# Patient Record
Sex: Female | Born: 1970 | Race: Black or African American | Hispanic: No | Marital: Married | Smoking: Never smoker
Health system: Southern US, Community
[De-identification: ages and names within clinical notes are randomized; demographics above are authoritative.]

## PROBLEM LIST (undated history)

## (undated) DIAGNOSIS — R0602 Shortness of breath: Secondary | ICD-10-CM

## (undated) DIAGNOSIS — G629 Polyneuropathy, unspecified: Secondary | ICD-10-CM

## (undated) DIAGNOSIS — R600 Localized edema: Secondary | ICD-10-CM

## (undated) DIAGNOSIS — K5900 Constipation, unspecified: Secondary | ICD-10-CM

## (undated) DIAGNOSIS — M1990 Unspecified osteoarthritis, unspecified site: Secondary | ICD-10-CM

## (undated) DIAGNOSIS — M719 Bursopathy, unspecified: Secondary | ICD-10-CM

## (undated) DIAGNOSIS — C801 Malignant (primary) neoplasm, unspecified: Secondary | ICD-10-CM

## (undated) DIAGNOSIS — F32A Depression, unspecified: Secondary | ICD-10-CM

## (undated) DIAGNOSIS — M2550 Pain in unspecified joint: Secondary | ICD-10-CM

## (undated) DIAGNOSIS — D649 Anemia, unspecified: Secondary | ICD-10-CM

## (undated) DIAGNOSIS — I10 Essential (primary) hypertension: Secondary | ICD-10-CM

## (undated) DIAGNOSIS — G43909 Migraine, unspecified, not intractable, without status migrainosus: Secondary | ICD-10-CM

## (undated) DIAGNOSIS — R519 Headache, unspecified: Secondary | ICD-10-CM

## (undated) DIAGNOSIS — Z923 Personal history of irradiation: Secondary | ICD-10-CM

## (undated) DIAGNOSIS — R51 Headache: Secondary | ICD-10-CM

## (undated) DIAGNOSIS — G5600 Carpal tunnel syndrome, unspecified upper limb: Secondary | ICD-10-CM

## (undated) DIAGNOSIS — C50919 Malignant neoplasm of unspecified site of unspecified female breast: Secondary | ICD-10-CM

## (undated) DIAGNOSIS — R0600 Dyspnea, unspecified: Secondary | ICD-10-CM

## (undated) DIAGNOSIS — Z9189 Other specified personal risk factors, not elsewhere classified: Secondary | ICD-10-CM

## (undated) DIAGNOSIS — K219 Gastro-esophageal reflux disease without esophagitis: Secondary | ICD-10-CM

## (undated) DIAGNOSIS — M549 Dorsalgia, unspecified: Secondary | ICD-10-CM

## (undated) DIAGNOSIS — G4733 Obstructive sleep apnea (adult) (pediatric): Secondary | ICD-10-CM

## (undated) DIAGNOSIS — R7303 Prediabetes: Secondary | ICD-10-CM

## (undated) HISTORY — DX: Constipation, unspecified: K59.00

## (undated) HISTORY — PX: BREAST SURGERY: SHX581

## (undated) HISTORY — DX: Obstructive sleep apnea (adult) (pediatric): G47.33

## (undated) HISTORY — DX: Prediabetes: R73.03

## (undated) HISTORY — DX: Essential (primary) hypertension: I10

## (undated) HISTORY — DX: Shortness of breath: R06.02

## (undated) HISTORY — DX: Pain in unspecified joint: M25.50

## (undated) HISTORY — DX: Bursopathy, unspecified: M71.9

## (undated) HISTORY — DX: Carpal tunnel syndrome, unspecified upper limb: G56.00

## (undated) HISTORY — DX: Migraine, unspecified, not intractable, without status migrainosus: G43.909

## (undated) HISTORY — DX: Dorsalgia, unspecified: M54.9

## (undated) HISTORY — DX: Depression, unspecified: F32.A

## (undated) HISTORY — DX: Other specified personal risk factors, not elsewhere classified: Z91.89

## (undated) SURGERY — BREAST LUMPECTOMY WITH NEEDLE LOCALIZATION
Anesthesia: General | Laterality: Right

---

## 2002-04-01 HISTORY — PX: TUBAL LIGATION: SHX77

## 2015-03-09 DIAGNOSIS — G44009 Cluster headache syndrome, unspecified, not intractable: Secondary | ICD-10-CM | POA: Insufficient documentation

## 2015-03-09 DIAGNOSIS — D649 Anemia, unspecified: Secondary | ICD-10-CM | POA: Insufficient documentation

## 2015-03-09 DIAGNOSIS — I10 Essential (primary) hypertension: Secondary | ICD-10-CM | POA: Insufficient documentation

## 2015-03-09 DIAGNOSIS — Z0000 Encounter for general adult medical examination without abnormal findings: Secondary | ICD-10-CM | POA: Insufficient documentation

## 2015-03-09 DIAGNOSIS — Z9289 Personal history of other medical treatment: Secondary | ICD-10-CM | POA: Insufficient documentation

## 2015-03-22 ENCOUNTER — Ambulatory Visit: Payer: Self-pay | Attending: Oncology

## 2015-03-22 VITALS — BP 137/94 | HR 73 | Temp 98.5°F | Resp 18 | Ht 71.0 in | Wt 315.3 lb

## 2015-03-22 DIAGNOSIS — Z0000 Encounter for general adult medical examination without abnormal findings: Secondary | ICD-10-CM

## 2015-03-22 NOTE — Progress Notes (Signed)
Subjective:     Patient ID: Michelle Mcmillan, female   DOB: 1970/07/08, 44 y.o.   MRN: [id number]  HPI   Review of Systems     Objective:   Physical Exam  Pulmonary/Chest: Right breast exhibits no inverted nipple, no mass, no nipple discharge, no skin change and no tenderness. Left breast exhibits no inverted nipple, no mass, no nipple discharge, no skin change and no tenderness. Breasts are symmetrical.       Assessment:     44 year old patient presents for [facility] visitPatient screened, and meets B[facility]CP eligibility.  Patient does not have insurance, Medicare or Medicaid.  Handout given on Affordable Care Act.Instructed patient on breast self-exam using teach back method.  CBE unremarkable.  No mass or lump palpated. Patient reports her last mammograms were in [place], and that she was to have 6 month follow-up in June 2016 , but she was moving, and did not go for mammogram.   She is getting married 04/08/15.  Feeling a little stressed and does not want to wait long for mammogram.  Explained need for film comparison.     Plan:     Sent patient to breast center to sign release for film/mammogram report.  Will schedule mammogram when outside films received.

## 2015-04-02 DIAGNOSIS — C50919 Malignant neoplasm of unspecified site of unspecified female breast: Secondary | ICD-10-CM

## 2015-04-02 HISTORY — DX: Malignant neoplasm of unspecified site of unspecified female breast: C50.919

## 2015-04-20 ENCOUNTER — Other Ambulatory Visit: Payer: Self-pay

## 2015-04-20 DIAGNOSIS — N63 Unspecified lump in unspecified breast: Secondary | ICD-10-CM

## 2015-04-21 ENCOUNTER — Inpatient Hospital Stay
Admission: RE | Admit: 2015-04-21 | Discharge: 2015-04-21 | Disposition: A | Discharge status: Home / Self Care | Payer: Self-pay | Source: Ambulatory Visit | Attending: *Deleted | Admitting: *Deleted

## 2015-04-21 ENCOUNTER — Other Ambulatory Visit: Payer: Self-pay | Admitting: *Deleted

## 2015-04-21 DIAGNOSIS — Z9289 Personal history of other medical treatment: Secondary | ICD-10-CM

## 2015-05-03 ENCOUNTER — Ambulatory Visit
Admission: RE | Admit: 2015-05-03 | Discharge: 2015-05-03 | Disposition: A | Discharge status: Home / Self Care | Payer: Medicaid Other | Source: Ambulatory Visit | Attending: Oncology | Admitting: Oncology

## 2015-05-03 ENCOUNTER — Other Ambulatory Visit: Payer: Self-pay | Admitting: *Deleted

## 2015-05-03 DIAGNOSIS — N63 Unspecified lump in unspecified breast: Secondary | ICD-10-CM

## 2015-05-03 DIAGNOSIS — D242 Benign neoplasm of left breast: Secondary | ICD-10-CM | POA: Insufficient documentation

## 2015-05-04 ENCOUNTER — Other Ambulatory Visit: Payer: Self-pay

## 2015-05-04 DIAGNOSIS — N63 Unspecified lump in unspecified breast: Secondary | ICD-10-CM

## 2015-05-08 ENCOUNTER — Ambulatory Visit: Payer: Self-pay

## 2015-05-10 ENCOUNTER — Ambulatory Visit
Admission: RE | Admit: 2015-05-10 | Discharge: 2015-05-10 | Disposition: A | Discharge status: Home / Self Care | Payer: Medicaid Other | Source: Ambulatory Visit | Attending: Oncology | Admitting: Oncology

## 2015-05-10 ENCOUNTER — Other Ambulatory Visit: Payer: Self-pay

## 2015-05-10 DIAGNOSIS — N63 Unspecified lump in unspecified breast: Secondary | ICD-10-CM

## 2015-05-10 DIAGNOSIS — C50411 Malignant neoplasm of upper-outer quadrant of right female breast: Secondary | ICD-10-CM | POA: Insufficient documentation

## 2015-05-11 NOTE — Progress Notes (Signed)
Received pathology report from Dr. Dicie Beam.  Phoned patient with results.  Patient requests husband also be able to hear results, and ask questions.  Introduced IT trainer.  Scheduled patient for consult with Dr. Grayland Ormond on Thursday 05/18/15 at 11:15.  Patient to fill out [facility] forms at that time. To bring photo ID, and birth certificate. To call with questions or needs prior to visit.  Oncology Nurse Navigator Documentation  Navigator Location: [facility]Med Onc (05/11/15 1600) Navigator Encounter Type: Introductory phone call (05/11/15 1600)   Abnormal Finding Date: 05/09/15 (05/11/15 1600) Confirmed Diagnosis Date: 05/11/15 (05/11/15 1600)     Patient Visit Type: Initial (05/11/15 1600)   Barriers/Navigation Needs: Financial;Family concerns;Education;Coordination of Care (05/11/15 1600) Education: Newly Diagnosed Cancer Education;Accessing Care/ Finding Providers;Coping with Diagnosis/ Prognosis;Understanding Cancer/ Treatment Options (05/11/15 1600) Interventions: Coordination of Care;Education Method (05/11/15 1600)     Education Method: Verbal (05/11/15 1600)      Acuity: Level 2 (05/11/15 1600)   Acuity Level 2: Educational needs;Initial guidance, education and coordination as needed;Referrals such as genetics, survivorship;Assistance expediting appointments (05/11/15 1600)     Time Spent with Patient: 60 (05/11/15 1600)

## 2015-05-15 LAB — SURGICAL PATHOLOGY

## 2015-05-18 ENCOUNTER — Inpatient Hospital Stay: Payer: Medicaid Other | Attending: Oncology | Admitting: Oncology

## 2015-05-18 ENCOUNTER — Encounter (INDEPENDENT_AMBULATORY_CARE_PROVIDER_SITE_OTHER): Payer: Self-pay

## 2015-05-18 ENCOUNTER — Encounter: Payer: Self-pay | Admitting: Oncology

## 2015-05-18 VITALS — Ht 71.06 in | Wt 321.9 lb

## 2015-05-18 DIAGNOSIS — C50411 Malignant neoplasm of upper-outer quadrant of right female breast: Secondary | ICD-10-CM | POA: Diagnosis not present

## 2015-05-18 DIAGNOSIS — I10 Essential (primary) hypertension: Secondary | ICD-10-CM | POA: Diagnosis not present

## 2015-05-18 DIAGNOSIS — C50911 Malignant neoplasm of unspecified site of right female breast: Secondary | ICD-10-CM

## 2015-05-18 DIAGNOSIS — Z170 Estrogen receptor positive status [ER+]: Secondary | ICD-10-CM | POA: Insufficient documentation

## 2015-05-18 DIAGNOSIS — D242 Benign neoplasm of left breast: Secondary | ICD-10-CM | POA: Diagnosis not present

## 2015-05-18 DIAGNOSIS — Z79899 Other long term (current) drug therapy: Secondary | ICD-10-CM | POA: Diagnosis not present

## 2015-05-18 NOTE — Progress Notes (Signed)
  Oncology Nurse Navigator Documentation  Navigator Location: [facility] On[facility] (05/18/15 1500) Navigator Encounter Type: Initial MedOnc;Clinic/MDC;Education (05/18/15 1500)           Patient Visit Type: MedOnc (05/18/15 1500) Treatment Phase: Pre-Tx/Tx Discussion (05/18/15 1500) Barriers/Navigation Needs: Financial;Family concerns;Education;Coordination of Care (05/18/15 1500) Education: Understanding Cancer/ Treatment Options;Coping with Diagnosis/ Prognosis;Newly Diagnosed Cancer Education;Concerns with Insurance Coverage;Concerns with Finances/ Eligibility (05/18/15 1500) Interventions: Transportations;Coordination of Care [facility]; ) (05/18/15 1500)   Coordination of Care: Appts (05/18/15 1500) Education Method: Verbal;Written;Teach-back (05/18/15 1500)  Support Groups/Services: Breast Support Group;W[facility] to Recovery (05/18/15 1500)   Acuity: Level 2 (05/18/15 1500)   Acuity Level 2: Initial guidance, education and coordination as needed;Assistance expediting appointments;Educational needs;Referrals such as genetics, survivorship;Ongoing guidance and education throughout treatment as needed;Survivorship (05/18/15 1500)     Time Spent with Patient: 90 (05/18/15 1500)   Met with patient, and husband at initial Med Onc visit with Dr. Grayland Ormond.  Scheduled patient for surgical consult with Dr. Azalee Course in [place] on 05/24/15 at 2:15.  Given Breast Cancer Treatment Handbook/folder with hospital services.  Filled out [facility] paperwork.  Patient to return on 05/22/15 at 3:00 for BRCA testing.

## 2015-05-19 NOTE — Progress Notes (Signed)
Patient had positive biopsy. Referred to Dr. Grayland Ormond for Medical Oncolgy visit 05/18/15, and Dr. Azalee Course for surgical consult 05/24/15 . Copy to [facility].

## 2015-05-24 ENCOUNTER — Encounter: Payer: Self-pay | Admitting: Surgery

## 2015-05-24 ENCOUNTER — Ambulatory Visit (INDEPENDENT_AMBULATORY_CARE_PROVIDER_SITE_OTHER): Payer: Medicaid Other | Admitting: Surgery

## 2015-05-24 VITALS — BP 152/85 | HR 55 | Temp 98.6°F | Ht 71.0 in | Wt 324.0 lb

## 2015-05-24 DIAGNOSIS — C50911 Malignant neoplasm of unspecified site of right female breast: Secondary | ICD-10-CM | POA: Diagnosis not present

## 2015-05-24 DIAGNOSIS — I10 Essential (primary) hypertension: Secondary | ICD-10-CM | POA: Insufficient documentation

## 2015-05-24 NOTE — Patient Instructions (Signed)
Our upcoming plan is as follows:  If your BRCA is negative, we will plan on your Lumpectomy to be done on 06/05/15 by Dr. Azalee Course at [facility]. I will call you as soon as I see results come back.  If your BRCA is positive, we will plan on seeing you in our office on 05/31/15 at 0830am in our [employer] [facility] [employer], [place], [place]. And then proceed with surgery on 06/05/15.

## 2015-05-25 ENCOUNTER — Encounter: Payer: Self-pay | Admitting: Surgery

## 2015-05-25 DIAGNOSIS — C50411 Malignant neoplasm of upper-outer quadrant of right female breast: Secondary | ICD-10-CM | POA: Insufficient documentation

## 2015-05-25 NOTE — Progress Notes (Signed)
Subjective:     Patient ID: Michelle Mcmillan, female   DOB: 10-Jul-1970, 45 y.o.   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 A shim was doing her normal screening mammograms whenever this was found and biopsied. Patient states that she has not noted any skin changes dimpling nipple discharge or any other changes. Patient states that she did have a place they were following in her left breast with her yearly mammograms however she has never had any biopsies and not had any abnormalities in the right breast in total now.  Patient does not have any family history of any breast cancers and no history of any other type of cancers. Patient started her period around the age of 10 she's had a total of 4 children with the first being at the age of 17. Patient breast fed her children and patient did use birth control pills for about 20 years until her tubal ligation in 2004.    Past Medical History  Diagnosis Date  . Hypertension    Past Surgical History  Procedure Laterality Date  . Cesarean section  1988    x1  . Tubal ligation  2004   Family History  Problem Relation Age of Onset  . Breast cancer Neg Hx   . Hypertension Mother   . Hypertension Father   . Hypertension Sister    Social History   Social History  . Marital Status: Married    Spouse Name: N/A  . Number of Children: N/A  . Years of Education: N/A   Social History Main Topics  . Smoking status: Never Smoker   . Smokeless tobacco: Never Used  . Alcohol Use: No  . Drug Use: No  . Sexual Activity: Not Asked   Other Topics Concern  . None   Social History Narrative    Current outpatient prescriptions:  .  hydrochlorothiazide (HYDRODIURIL) 25 MG tablet, Take 25 mg by mouth daily., Disp: , Rfl:  No Known Allergies    Review of Systems  Constitutional: Negative for  fever, chills, activity change, appetite change and fatigue.  HENT: Negative for congestion and sore throat.   Respiratory: Negative for cough, choking, chest tightness, shortness of breath and wheezing.   Cardiovascular: Negative for chest pain, palpitations and leg swelling.  Gastrointestinal: Negative for nausea, vomiting, abdominal pain, constipation and abdominal distention.  Genitourinary: Negative for dysuria and hematuria.  Musculoskeletal: Negative for back pain and neck pain.  Skin: Negative for color change, pallor, rash and wound.  Neurological: Negative for dizziness and weakness.  Hematological: Negative for adenopathy. Does not bruise/bleed easily.  Psychiatric/Behavioral: Negative for agitation. The patient is nervous/anxious.   All other systems reviewed and are negative.      Filed Vitals:   05/24/15 1437  BP: 152/85  Pulse: 55  Temp: 98.6 F (37 C)     Objective:   Physical Exam  Constitutional: She is oriented to person, place, and time. She appears well-developed and well-nourished. No distress.  HENT:  Head: Normocephalic and atraumatic.  Right Ear: External ear normal.  Left Ear: External ear normal.  Nose: Nose normal.  Mouth/Throat: Oropharynx is clear and moist. No oropharyngeal exudate.  Eyes: Conjunctivae and EOM are normal. Pupils are equal, round, and reactive to light. No scleral icterus.  Neck: Normal range of motion. Neck supple. No tracheal deviation present.  Cardiovascular: Normal rate, regular rhythm, normal heart sounds and intact distal pulses.  Exam reveals no gallop and no friction rub.   No murmur heard. Pulmonary/Chest: Effort normal and breath sounds normal. No respiratory distress. She has no wheezes. She has no rales. She exhibits no tenderness.  Abdominal: Soft. Bowel sounds are normal. She exhibits no distension. There is no tenderness. There is no rebound and no guarding.  Musculoskeletal: Normal range of motion. She exhibits no  edema or tenderness.  Neurological: She is alert and oriented to person, place, and time. No cranial nerve deficit.  Skin: Skin is warm and dry. No rash noted. No erythema. No pallor.  Psychiatric: She has a normal mood and affect. Her behavior is normal. Judgment and thought content normal.  Vitals reviewed. Breast Exam:   Right breast: No skin changes nipple discharge, no palpable masses. Some tenderness in the right upper outer quadrant where patient states biopsy was taken previously. 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 The risk of recurrence is similar between mastectomy and lumpectomy with radiation. I also discussed that given the small size of the cancer would recommend a needle localization lumpectomy with radiation to follow. I also discussed that we would need to do a sentinel lymph node biopsy to check the nodes. Explained to the patient that after her surgical treatment she would also need an aromatase inhibitor since her tumor was ER/PR +.   I discussed risks of bleeding, infection, damage to surrounding tissues, having positive margins, needing further resection, seroma, hematoma, need for drainage, damage to nerves causing arm numbness or difficulty raising arm, causing lymphoedema in the arm; as well as anesthesia risks of MI, stroke, prolonged ventilation, pulmonary embolism, thrombosis and even death.  I also briefly discussed with the patient that she is young for breast cancer and that genetic testing would need to be done.  She had the  bloodwork for this drawn earlier in the week. I discussed with the patient that if she did come back BRCA positive that I would give her the option of having a bilateral mastectomy with reconstruction. I discussed with the patient that it would be rare for her to be back positive giving a negative family history that if she was that her risk of recurrence would be much higher. We will contact her with these results and if they're negative we will proceed with the lumpectomy with radiation. If they're positive then I'll have her return to discuss surgical option she would like to take. Patient was given the opportunity to ask questions and have them answered.

## 2015-05-25 NOTE — Addendum Note (Signed)
Addended by: Hubbard Robinson on: 05/25/2015 06:57 PM   Modules accepted: Orders

## 2015-05-26 ENCOUNTER — Other Ambulatory Visit: Payer: Self-pay | Admitting: Surgery

## 2015-05-26 DIAGNOSIS — C50911 Malignant neoplasm of unspecified site of right female breast: Secondary | ICD-10-CM

## 2015-05-26 NOTE — Progress Notes (Signed)
[place]  Telephone:(336) [phone] [phone]) [phone]  ID: [name] OB: [birth year]  MR#: [id number]  YIR#:[id number]  Patient Care Team: Donnie Coffin, MD as PCP - General (Family Medicine) Rico Junker, RN as Registered Nurse Theodore Demark, RN as Registered Nurse  CHIEF COMPLAINT:  Chief Complaint  Patient presents with  . New Evaluation    INTERVAL HISTORY: Patient is a 45 year old female who was noted to have an abnormality on routine screening mammogram. Subsequent biopsy confirmed invasive mammary carcinoma. Patient has been referred to clinic for evaluation and additional treatment planning. She currently feels well and is asymptomatic. She has no neurologic complaints. She denies any recent fevers or illnesses. She has a good appetite and denies weight loss. She denies any pain. She has no chest pain or shortness of breath. She denies any nausea, vomiting, constipation, or diarrhea. She has no urinary complaints. Patient feels at her baseline and offers no specific complaints today.  REVIEW OF SYSTEMS:   Review of Systems  Constitutional: Negative.  Negative for fever, weight loss and malaise/fatigue.  Respiratory: Negative.  Negative for shortness of breath.   Cardiovascular: Negative.  Negative for chest pain.  Gastrointestinal: Negative.   Genitourinary: Negative.   Musculoskeletal: Negative.   Neurological: Negative.  Negative for weakness.    As per HPI. Otherwise, a complete review of systems is negatve.  PAST MEDICAL HISTORY: Past Medical History  Diagnosis Date  . Hypertension     PAST SURGICAL HISTORY: Past Surgical History  Procedure Laterality Date  . Cesarean section  1988    x1  . Tubal ligation  2004    FAMILY HISTORY Family History  Problem Relation Age of Onset  . Breast cancer Neg Hx   . Hypertension Mother   . Hypertension Father   . Hypertension Sister        ADVANCED DIRECTIVES:    HEALTH  MAINTENANCE: Social History  Substance Use Topics  . Smoking status: Never Smoker   . Smokeless tobacco: Never Used  . Alcohol Use: No     Colonoscopy:  PAP:  Bone density:  Lipid panel:  No Known Allergies  Current Outpatient Prescriptions  Medication Sig Dispense Refill  . hydrochlorothiazide (HYDRODIURIL) 25 MG tablet Take 25 mg by mouth daily.     No current facility-administered medications for this visit.    OBJECTIVE: There were no vitals filed for this visit.   Body mass index is 44.81 kg/(m^2).    ECOG FS:0 - Asymptomatic  General: Well-developed, well-nourished, no acute distress. Eyes: Pink conjunctiva, anicteric sclera. HEENT: Normocephalic, moist mucous membranes, clear oropharnyx. Breasts: Patient requested exam be deferred today. Lungs: Clear to auscultation bilaterally. Heart: Regular rate and rhythm. No rubs, murmurs, or gallops. Abdomen: Soft, nontender, nondistended. No organomegaly noted, normoactive bowel sounds. Musculoskeletal: No edema, cyanosis, or clubbing. Neuro: Alert, answering all questions appropriately. Cranial nerves grossly intact. Skin: No rashes or petechiae noted. Psych: Normal affect. Lymphatics: No cervical, calvicular, axillary or inguinal LAD.   LAB RESULTS:  No results found for: NA, K, CL, CO2, GLUCOSE, BUN, CREATININE, CALCIUM, PROT, ALBUMIN, AST, ALT, ALKPHOS, BILITOT, GFRNONAA, GFRAA  No results found for: WBC, NEUTROABS, HGB, HCT, MCV, PLT   STUDIES: Mm Digital Diagnostic Unilat R  05/10/2015  CLINICAL DATA:  Post biopsy mammogram of the right breast for clip placement. EXAM: DIAGNOSTIC RIGHT MAMMOGRAM POST ULTRASOUND BIOPSY COMPARISON:  Previous exam(s). FINDINGS: Mammographic images were obtained following ultrasound guided biopsy of a small mass in the  right breast at 10 o'clock. The coil shaped biopsy marking clip is appropriately positioned within the biopsied mass in the upper-outer right breast at 10 o'clock  IMPRESSION: Appropriate positioning of the coil shaped biopsy marking clip in the biopsied mass at 10 o'clock in the right breast Final Assessment: Post Procedure Mammograms for Marker Placement Electronically Signed   By: Ammie Ferrier M.D.   On: 05/10/2015 13:49   US Breast Ltd Uni Left Inc Axilla  05/03/2015  CLINICAL DATA:  History of probably benign left breast mass demonstrated on ultrasound dated 04/07/2013 for which a six-month follow-up was recommended at that time. Patient returns today for the follow-up exam. EXAM: DIGITAL DIAGNOSTIC BILATERAL MAMMOGRAM WITH 3D TOMOSYNTHESIS WITH CAD ULTRASOUND BILATERAL BREAST COMPARISON:  Previous exam(s). ACR Breast Density Category b: There are scattered areas of fibroglandular density. FINDINGS: Bilateral CC and MLO projections were obtained today with 3D tomosynthesis. There is a new spiculated mass within the far lateral RIGHT breast, upper outer quadrant, at posterior depth, measuring approximately 8 mm greatest dimension. Additional oval circumscribed mass is seen within the lower outer quadrant of the right breast, at middle depth, measuring approximately 8 mm greatest dimension. Oval circumscribed mass is again seen within the upper-outer quadrant of the left breast, at middle depth, measuring approximately 3 cm greatest dimension. Mammographic images were processed with CAD. Targeted ultrasound was initially performed of the outer right breast. There is an irregular hypoechoic mass, with spiculated margins, located within the right breast at the 10 o'clock axis, 13 cm from the nipple, with internal vascularity, measuring 11 x 8 x 11 mm, corresponding to the mammographic finding. A benign cyst is seen within the right breast at the 7 o'clock axis, 8 cm from the nipple, measuring 10 x 5 x 8 mm, corresponding to the additional mammographic finding. Targeted ultrasound was then performed of the left breast, evaluating the upper-outer quadrant of the left  breast, corresponding to the site of the previously described mass at the 1 o'clock axis. The oval circumscribed hypoechoic mass within the left breast at the 1 o'clock axis, 7 cm from the nipple, is stable in size, measuring 2.3 x 1.1 x 2 cm, consistent with benign fibroadenoma. Both axillae were evaluated with ultrasound. There are no enlarged or morphologically abnormal lymph nodes identified. IMPRESSION: 1. Suspicious irregular mass, with spiculated margins, located within the RIGHT breast at the 10 o'clock axis, 13 cm from the nipple, measuring 11 x 8 x 11 mm. Ultrasound-guided biopsy is recommended. 2. Benign cyst within the right breast at the 7 o'clock axis, measuring 10 x 5 x 8 mm, corresponding to the mammographic finding. No follow-up is needed for this benign finding. 3. Benign fibroadenoma within the left breast at the 1 o'clock axis, 7 cm from the nipple, measuring 2.3 x 1.1 x 2 cm, stable for greater than 2 years. No further follow-up diagnostic imaging is needed for this benign finding. RECOMMENDATION: Ultrasound-guided biopsy of the suspicious mass within the right breast at the 10 o'clock axis, 13 cm from the nipple, measuring 11 x 8 x 11 mm. Ordering physician's office will be contacted with today's results and ultrasound-guided biopsy will be subsequently scheduled at patient's earliest convenience. I have discussed the findings and recommendations with the patient. Results were also provided in writing at the conclusion of the visit. If applicable, a reminder letter will be sent to the patient regarding the next appointment. BI-RADS CATEGORY  Category 4C: High suspicion for malignancy. Electronically Signed  By: Franki Cabot M.D.   On: 05/03/2015 14:43   US Breast Ltd Uni Right Inc Axilla  05/03/2015  CLINICAL DATA:  History of probably benign left breast mass demonstrated on ultrasound dated 04/07/2013 for which a six-month follow-up was recommended at that time. Patient returns today for  the follow-up exam. EXAM: DIGITAL DIAGNOSTIC BILATERAL MAMMOGRAM WITH 3D TOMOSYNTHESIS WITH CAD ULTRASOUND BILATERAL BREAST COMPARISON:  Previous exam(s). ACR Breast Density Category b: There are scattered areas of fibroglandular density. FINDINGS: Bilateral CC and MLO projections were obtained today with 3D tomosynthesis. There is a new spiculated mass within the far lateral RIGHT breast, upper outer quadrant, at posterior depth, measuring approximately 8 mm greatest dimension. Additional oval circumscribed mass is seen within the lower outer quadrant of the right breast, at middle depth, measuring approximately 8 mm greatest dimension. Oval circumscribed mass is again seen within the upper-outer quadrant of the left breast, at middle depth, measuring approximately 3 cm greatest dimension. Mammographic images were processed with CAD. Targeted ultrasound was initially performed of the outer right breast. There is an irregular hypoechoic mass, with spiculated margins, located within the right breast at the 10 o'clock axis, 13 cm from the nipple, with internal vascularity, measuring 11 x 8 x 11 mm, corresponding to the mammographic finding. A benign cyst is seen within the right breast at the 7 o'clock axis, 8 cm from the nipple, measuring 10 x 5 x 8 mm, corresponding to the additional mammographic finding. Targeted ultrasound was then performed of the left breast, evaluating the upper-outer quadrant of the left breast, corresponding to the site of the previously described mass at the 1 o'clock axis. The oval circumscribed hypoechoic mass within the left breast at the 1 o'clock axis, 7 cm from the nipple, is stable in size, measuring 2.3 x 1.1 x 2 cm, consistent with benign fibroadenoma. Both axillae were evaluated with ultrasound. There are no enlarged or morphologically abnormal lymph nodes identified. IMPRESSION: 1. Suspicious irregular mass, with spiculated margins, located within the RIGHT breast at the 10  o'clock axis, 13 cm from the nipple, measuring 11 x 8 x 11 mm. Ultrasound-guided biopsy is recommended. 2. Benign cyst within the right breast at the 7 o'clock axis, measuring 10 x 5 x 8 mm, corresponding to the mammographic finding. No follow-up is needed for this benign finding. 3. Benign fibroadenoma within the left breast at the 1 o'clock axis, 7 cm from the nipple, measuring 2.3 x 1.1 x 2 cm, stable for greater than 2 years. No further follow-up diagnostic imaging is needed for this benign finding. RECOMMENDATION: Ultrasound-guided biopsy of the suspicious mass within the right breast at the 10 o'clock axis, 13 cm from the nipple, measuring 11 x 8 x 11 mm. Ordering physician's office will be contacted with today's results and ultrasound-guided biopsy will be subsequently scheduled at patient's earliest convenience. I have discussed the findings and recommendations with the patient. Results were also provided in writing at the conclusion of the visit. If applicable, a reminder letter will be sent to the patient regarding the next appointment. BI-RADS CATEGORY  Category 4C: High suspicion for malignancy. Electronically Signed   By: Franki Cabot M.D.   On: 05/03/2015 14:43   Mm Diag Breast Tomo Bilateral  05/03/2015  CLINICAL DATA:  History of probably benign left breast mass demonstrated on ultrasound dated 04/07/2013 for which a six-month follow-up was recommended at that time. Patient returns today for the follow-up exam. EXAM: DIGITAL DIAGNOSTIC BILATERAL MAMMOGRAM WITH 3D  TOMOSYNTHESIS WITH CAD ULTRASOUND BILATERAL BREAST COMPARISON:  Previous exam(s). ACR Breast Density Category b: There are scattered areas of fibroglandular density. FINDINGS: Bilateral CC and MLO projections were obtained today with 3D tomosynthesis. There is a new spiculated mass within the far lateral RIGHT breast, upper outer quadrant, at posterior depth, measuring approximately 8 mm greatest dimension. Additional oval circumscribed  mass is seen within the lower outer quadrant of the right breast, at middle depth, measuring approximately 8 mm greatest dimension. Oval circumscribed mass is again seen within the upper-outer quadrant of the left breast, at middle depth, measuring approximately 3 cm greatest dimension. Mammographic images were processed with CAD. Targeted ultrasound was initially performed of the outer right breast. There is an irregular hypoechoic mass, with spiculated margins, located within the right breast at the 10 o'clock axis, 13 cm from the nipple, with internal vascularity, measuring 11 x 8 x 11 mm, corresponding to the mammographic finding. A benign cyst is seen within the right breast at the 7 o'clock axis, 8 cm from the nipple, measuring 10 x 5 x 8 mm, corresponding to the additional mammographic finding. Targeted ultrasound was then performed of the left breast, evaluating the upper-outer quadrant of the left breast, corresponding to the site of the previously described mass at the 1 o'clock axis. The oval circumscribed hypoechoic mass within the left breast at the 1 o'clock axis, 7 cm from the nipple, is stable in size, measuring 2.3 x 1.1 x 2 cm, consistent with benign fibroadenoma. Both axillae were evaluated with ultrasound. There are no enlarged or morphologically abnormal lymph nodes identified. IMPRESSION: 1. Suspicious irregular mass, with spiculated margins, located within the RIGHT breast at the 10 o'clock axis, 13 cm from the nipple, measuring 11 x 8 x 11 mm. Ultrasound-guided biopsy is recommended. 2. Benign cyst within the right breast at the 7 o'clock axis, measuring 10 x 5 x 8 mm, corresponding to the mammographic finding. No follow-up is needed for this benign finding. 3. Benign fibroadenoma within the left breast at the 1 o'clock axis, 7 cm from the nipple, measuring 2.3 x 1.1 x 2 cm, stable for greater than 2 years. No further follow-up diagnostic imaging is needed for this benign finding.  RECOMMENDATION: Ultrasound-guided biopsy of the suspicious mass within the right breast at the 10 o'clock axis, 13 cm from the nipple, measuring 11 x 8 x 11 mm. Ordering physician's office will be contacted with today's results and ultrasound-guided biopsy will be subsequently scheduled at patient's earliest convenience. I have discussed the findings and recommendations with the patient. Results were also provided in writing at the conclusion of the visit. If applicable, a reminder letter will be sent to the patient regarding the next appointment. BI-RADS CATEGORY  Category 4C: High suspicion for malignancy. Electronically Signed   By: Franki Cabot M.D.   On: 05/03/2015 14:43   [place] Rt Breast Bx W Loc Dev 1st Lesion Img Bx Spec US Guide  05/12/2015  ADDENDUM REPORT: 05/12/2015 14:45 ADDENDUM: Pathology of the right breast biopsy revealed INVASIVE MAMMARY CARCINOMA, NO SPECIAL TYPE. Histologic grade of invasive carcinoma: Grade 1. 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 Lymphovascular invasion: Not identified. Comment: The diagnosis was called to Al Pimple on 05/11/15 at 2:45 PM by the pathologist. Read-back was performed. This was found to be concordant by Dr. Theda Sers. Recommendations:  Surgical and oncology referral. The patient was contacted by Jetta Lout, [facility]) on 05/12/15 for a post biopsy check. She  stated she did have bleeding following the biopsy for which she had to change the bandage three times. She stated she has no bleeding at this time or palpable hematoma. Post biopsy instructions were reviewed with the patient. All of her questions were answered. She stated she has been given her results. She has an appointment with Dr. Grayland Ormond in oncology on 05/18/15 at 11:15 AM. Addendum by Jetta Lout, [place] on 05/12/15. Electronically Signed   By: Ammie Ferrier M.D.   On: 05/12/2015 14:45  05/12/2015  CLINICAL DATA:  45 year old female presenting for  ultrasound-guided biopsy of a right breast mass. EXAM: ULTRASOUND GUIDED RIGHT BREAST CORE NEEDLE BIOPSY COMPARISON:  Previous exam(s). FINDINGS: I met with the patient and we discussed the procedure of ultrasound-guided biopsy, including benefits and alternatives. We discussed the high likelihood of a successful procedure. We discussed the risks of the procedure, including infection, bleeding, tissue injury, clip migration, and inadequate sampling. Informed written consent was given. The usual time-out protocol was performed immediately prior to the procedure. Using sterile technique and 1% Lidocaine as local anesthetic, under direct ultrasound visualization, a 14 gauge spring-loaded device was used to perform biopsy of a small right breast mass at 10 o'clock using a inferior approach. At the conclusion of the procedure a coil shaped tissue marker clip was deployed into the biopsy cavity. Follow up 2 view mammogram was performed and dictated separately. IMPRESSION: Ultrasound guided biopsy of mass in the right breast at 10 o'clock. No apparent complications. Electronically Signed: By: Ammie Ferrier M.D. 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  PLAN:    1. Breast cancer: Given the size of patient's tumor of 88m seen on mammogram, she definitely benefit from lumpectomy and a referral has been made to general surgery. Her surgery is scheduled for June 05, 2015. Will order Oncotype testing to assess whether chemotherapy is necessary.  If patient has a lumpectomy, she would definitely benefit from adjuvant XRT. She will also benefit from either tamoxifen or an aromatase inhibitor for total 5 years given the ER/PR status of her tumor. Patient will return to clinic approximately 1 to 2 weeks after her surgery to discuss her final pathology results and additional treatment planning. BCRA testing has also been ordered given  patient's age of less than 587  Approximately 45 minutes was spent in discussion of which greater than 50% was consultation.  Patient expressed understanding and was in agreement with this plan. She also understands that She can call clinic at any time with any questions, concerns, or complaints.    TLloyd Huger MD   05/26/2015 3:30 PM

## 2015-05-29 ENCOUNTER — Encounter
Admission: RE | Admit: 2015-05-29 | Discharge: 2015-05-29 | Disposition: A | Discharge status: Home / Self Care | Payer: Medicaid Other | Source: Ambulatory Visit | Attending: Surgery | Admitting: Surgery

## 2015-05-29 DIAGNOSIS — Z01810 Encounter for preprocedural cardiovascular examination: Secondary | ICD-10-CM | POA: Insufficient documentation

## 2015-05-29 DIAGNOSIS — Z01812 Encounter for preprocedural laboratory examination: Secondary | ICD-10-CM | POA: Insufficient documentation

## 2015-05-29 HISTORY — DX: Headache: R51

## 2015-05-29 HISTORY — DX: Headache, unspecified: R51.9

## 2015-05-29 LAB — COMPREHENSIVE METABOLIC PANEL
ALT: 19 U/L (ref 14–54)
ANION GAP: 5 (ref 5–15)
AST: 23 U/L (ref 15–41)
Albumin: 3.5 g/dL (ref 3.5–5.0)
Alkaline Phosphatase: 89 U/L (ref 38–126)
BUN: 14 mg/dL (ref 6–20)
CHLORIDE: 105 mmol/L (ref 101–111)
CO2: 30 mmol/L (ref 22–32)
Calcium: 8.6 mg/dL — ABNORMAL LOW (ref 8.9–10.3)
Creatinine, Ser: 0.9 mg/dL (ref 0.44–1.00)
Glucose, Bld: 98 mg/dL (ref 65–99)
POTASSIUM: 3.4 mmol/L — AB (ref 3.5–5.1)
SODIUM: 140 mmol/L (ref 135–145)
Total Bilirubin: 0.4 mg/dL (ref 0.3–1.2)
Total Protein: 7 g/dL (ref 6.5–8.1)

## 2015-05-29 LAB — CBC WITH DIFFERENTIAL/PLATELET
Basophils Absolute: 0 10*3/uL (ref 0–0.1)
Basophils Relative: 0 %
EOS ABS: 0.1 10*3/uL (ref 0–0.7)
EOS PCT: 2 %
HCT: 37.6 % (ref 35.0–47.0)
Hemoglobin: 12.2 g/dL (ref 12.0–16.0)
LYMPHS PCT: 20 %
Lymphs Abs: 1.7 10*3/uL (ref 1.0–3.6)
MCH: 25.9 pg — AB (ref 26.0–34.0)
MCHC: 32.4 g/dL (ref 32.0–36.0)
MCV: 79.9 fL — AB (ref 80.0–100.0)
MONO ABS: 0.6 10*3/uL (ref 0.2–0.9)
Monocytes Relative: 7 %
NEUTROS ABS: 5.7 10*3/uL (ref 1.4–6.5)
Neutrophils Relative %: 71 %
PLATELETS: 219 10*3/uL (ref 150–440)
RBC: 4.7 MIL/uL (ref 3.80–5.20)
RDW: 16.7 % — AB (ref 11.5–14.5)
WBC: 8.1 10*3/uL (ref 3.6–11.0)

## 2015-05-29 NOTE — Patient Instructions (Signed)
  Your procedure is scheduled [id number] 06/05/15 Report to [place] [place]. Marland Kitchen  Remember: Instructions that are not followed completely may result in serious medical risk, up to and including death, or upon the discretion of your surgeon and anesthesiologist your surgery may need to be rescheduled.    __X__ 1. Do not eat food or drink liquids after midnight. No gum chewing or hard candies.     __X__ 2. No Alcohol for 24 hours before or after surgery.   ____ 3. Bring all medications with you on the day of surgery if instructed.    __X__ 4. Notify your doctor if there is any change in your medical condition     (cold, fever, infections).     Do not wear jewelry, make-up, hairpins, clips or nail polish.  Do not wear lotions, powders, or perfumes.   Do not shave 48 hours prior to surgery. Men may shave face and neck.  Do not bring valuables to the hospital.    [facility] is not responsible for any belongings or valuables.               Contacts, dentures or bridgework may not be worn into surgery.  Leave your suitcase in the car. After surgery it may be brought to your room.  For patients admitted to the hospital, discharge time is determined by your                treatment team.   Patients discharged the day of surgery will not be allowed to drive home.   Please read over the following fact sheets that you were given:   Surgical Site Infection Prevention   __X__ Take these medicines the morning of surgery with A SIP OF WATER:    1. NONE  2.   3.   4.  5.  6.  ____ Fleet Enema (as directed)   __X__ Use CHG Soap as directed  ____ Use inhalers on the day of surgery  ____ Stop metformin 2 days prior to surgery    ____ Take 1/2 of usual insulin dose the night before surgery and none on the morning of surgery.   ____ Stop Coumadin/Plavix/aspirin on   __X__ Stop Anti-inflammatories on TODAY (IBUPROFEN, EXCEDRIN, MAY USE TYLENOL ONLY FOR ACHES OR  PAINS)   ____ Stop supplements until after surgery.    ____ Bring C-Pap to the hospital.

## 2015-05-30 ENCOUNTER — Other Ambulatory Visit: Payer: Self-pay

## 2015-05-30 ENCOUNTER — Telehealth: Payer: Self-pay

## 2015-05-30 ENCOUNTER — Telehealth: Payer: Self-pay | Admitting: Surgery

## 2015-05-30 NOTE — Telephone Encounter (Signed)
Michelle Mcmillan, Nurse Navigator to find out exactly when I can expect BRCA testing to return a result. She called the genetics company and was told that at the earliest, we can expect it to be done on 06/08/15. With scheduled surgery already on 06/05/15, had a conversation with Dr. Azalee Course and patient about what should be next step.   It has been decided through surgeon and patient that Surgery will be moved to 06/15/15 so that BRCA will be back and decision can be made, if additional surgery will be needed.  Angie notified at this time so that changes can be made to OR schedule.

## 2015-05-30 NOTE — Telephone Encounter (Signed)
Pt advised of pre op date/time and sx date. Sx: 06/15/15 with Dr Maren Reamer lumpectomy with Needle Localization and sentinel node injection.  Pre op: 05/29/15 @ 1:15pm--Office.   Patient made aware to arrive at 9:[facility] the day of surgery.    This surgery date was changed from 06/05/15 to 06/15/15.

## 2015-05-30 NOTE — Pre-Procedure Instructions (Signed)
CBC and Met B results sent to Dr. Azalee Course and anesthesia for review.

## 2015-06-02 ENCOUNTER — Telehealth: Payer: Self-pay | Admitting: Surgery

## 2015-06-02 NOTE — Telephone Encounter (Signed)
The cancer center called and said that [place] has been approved and will be retroed from 12/16

## 2015-06-05 ENCOUNTER — Ambulatory Visit: Admission: RE | Admit: 2015-06-05 | Payer: Medicaid Other | Source: Ambulatory Visit

## 2015-06-05 ENCOUNTER — Ambulatory Visit: Payer: Medicaid Other

## 2015-06-08 ENCOUNTER — Telehealth: Payer: Self-pay | Admitting: Surgery

## 2015-06-08 NOTE — Telephone Encounter (Signed)
Patient is waiting on test results

## 2015-06-09 ENCOUNTER — Telehealth: Payer: Self-pay | Admitting: Oncology

## 2015-06-09 NOTE — Telephone Encounter (Signed)
Patient wants to know about her BRACA test results because she is having a lumpectomy on 3/16 but if the results are positive she wants to do a full bilateral mastectomy. Please advise asap by calling patient with results. Thanks.

## 2015-06-09 NOTE — Telephone Encounter (Signed)
Patient called back regarding results. I told her you are waiting for a phone call from dr

## 2015-06-09 NOTE — Telephone Encounter (Signed)
Spoke with Mickel Baas, Dr. Gary Fleet nurse at this time. She states that this test is being reviewed by Dr. Grayland Ormond currently and will call patient with result Monday 06/12/15.  Patient was informed of this information.

## 2015-06-09 NOTE — Telephone Encounter (Signed)
I think BRCA is negative.  Please confirm with navigation.

## 2015-06-09 NOTE — Telephone Encounter (Signed)
Call made to Dr. Gary Fleet nurse [phone] at this time to ask about BRCA testing results. No answer. Left voicemail for nurse to return my phone call.

## 2015-06-12 NOTE — Telephone Encounter (Signed)
Patient is BRCA negative, attempted to reach patient. Message left on vm for patient to call back at her convenience for results. I will also place a copy in the mail to patient.

## 2015-06-15 ENCOUNTER — Ambulatory Visit: Payer: Medicaid Other | Admitting: Anesthesiology

## 2015-06-15 ENCOUNTER — Encounter
Admission: RE | Disposition: A | Discharge status: Home / Self Care | Payer: Self-pay | Source: Ambulatory Visit | Attending: Surgery

## 2015-06-15 ENCOUNTER — Encounter: Payer: Self-pay | Admitting: Oncology

## 2015-06-15 ENCOUNTER — Encounter: Payer: Self-pay | Admitting: *Deleted

## 2015-06-15 ENCOUNTER — Ambulatory Visit
Admission: RE | Admit: 2015-06-15 | Discharge: 2015-06-15 | Disposition: A | Discharge status: Home / Self Care | Payer: Medicaid Other | Source: Ambulatory Visit | Attending: Surgery | Admitting: Surgery

## 2015-06-15 ENCOUNTER — Encounter
Admission: RE | Admit: 2015-06-15 | Discharge: 2015-06-15 | Disposition: A | Discharge status: Home / Self Care | Payer: Medicaid Other | Source: Ambulatory Visit | Attending: Surgery | Admitting: Surgery

## 2015-06-15 DIAGNOSIS — Z9851 Tubal ligation status: Secondary | ICD-10-CM | POA: Insufficient documentation

## 2015-06-15 DIAGNOSIS — Z79899 Other long term (current) drug therapy: Secondary | ICD-10-CM | POA: Insufficient documentation

## 2015-06-15 DIAGNOSIS — I10 Essential (primary) hypertension: Secondary | ICD-10-CM | POA: Diagnosis not present

## 2015-06-15 DIAGNOSIS — C50911 Malignant neoplasm of unspecified site of right female breast: Secondary | ICD-10-CM | POA: Insufficient documentation

## 2015-06-15 HISTORY — PX: BREAST EXCISIONAL BIOPSY: SUR124

## 2015-06-15 HISTORY — PX: BREAST LUMPECTOMY WITH NEEDLE LOCALIZATION: SHX5759

## 2015-06-15 HISTORY — PX: SENTINEL NODE BIOPSY: SHX6608

## 2015-06-15 LAB — POCT PREGNANCY, URINE: Preg Test, Ur: NEGATIVE

## 2015-06-15 SURGERY — BREAST LUMPECTOMY WITH NEEDLE LOCALIZATION
Anesthesia: General | Laterality: Right | Wound class: Clean

## 2015-06-15 MED ORDER — FENTANYL CITRATE (PF) 100 MCG/2ML IJ SOLN
INTRAMUSCULAR | Status: DC | PRN
  Administered 2015-06-15: 100 ug via INTRAVENOUS
  Administered 2015-06-15: 150 ug via INTRAVENOUS
  Administered 2015-06-15: 100 ug via INTRAVENOUS

## 2015-06-15 MED ORDER — LIDOCAINE HCL (CARDIAC) 20 MG/ML IV SOLN
INTRAVENOUS | Status: DC | PRN
  Administered 2015-06-15: 100 mg via INTRAVENOUS

## 2015-06-15 MED ORDER — ONDANSETRON HCL 4 MG/2ML IJ SOLN
INTRAMUSCULAR | Status: DC | PRN
  Administered 2015-06-15: 4 mg via INTRAVENOUS

## 2015-06-15 MED ORDER — OXYCODONE-ACETAMINOPHEN 5-325 MG PO TABS: 1.0000 | ORAL_TABLET | ORAL | Status: DC | PRN

## 2015-06-15 MED ORDER — CHLORHEXIDINE GLUCONATE 4 % EX LIQD: 1.0000 "application " | Freq: Once | CUTANEOUS | Status: DC

## 2015-06-15 MED ORDER — OXYCODONE HCL 5 MG/5ML PO SOLN: 5.0000 mg | Freq: Once | ORAL | Status: AC | PRN

## 2015-06-15 MED ORDER — HYDROMORPHONE HCL 1 MG/ML IJ SOLN: 0.2500 mg | INTRAMUSCULAR | Status: DC | PRN

## 2015-06-15 MED ORDER — LACTATED RINGERS IV SOLN
INTRAVENOUS | Status: DC
  Administered 2015-06-15: 11:00:00 via INTRAVENOUS

## 2015-06-15 MED ORDER — METHYLENE BLUE 0.5 % INJ SOLN
INTRAVENOUS | Status: AC
  Filled 2015-06-15: qty 10

## 2015-06-15 MED ORDER — PROMETHAZINE HCL 25 MG/ML IJ SOLN
INTRAMUSCULAR | Status: AC
  Filled 2015-06-15: qty 1

## 2015-06-15 MED ORDER — ROCURONIUM BROMIDE 100 MG/10ML IV SOLN
INTRAVENOUS | Status: DC | PRN
  Administered 2015-06-15: 10 mg via INTRAVENOUS
  Administered 2015-06-15: 40 mg via INTRAVENOUS

## 2015-06-15 MED ORDER — DEXTROSE 5 % IV SOLN
3.0000 g | INTRAVENOUS | Status: AC
  Administered 2015-06-15: 3 g via INTRAVENOUS
  Filled 2015-06-15: qty 3000

## 2015-06-15 MED ORDER — DEXAMETHASONE SODIUM PHOSPHATE 4 MG/ML IJ SOLN
INTRAMUSCULAR | Status: DC | PRN
  Administered 2015-06-15: 5 mg via INTRAVENOUS

## 2015-06-15 MED ORDER — PROPOFOL 10 MG/ML IV BOLUS
INTRAVENOUS | Status: DC | PRN
  Administered 2015-06-15: 200 mg via INTRAVENOUS

## 2015-06-15 MED ORDER — TECHNETIUM TC 99M SULFUR COLLOID
1.0800 | Freq: Once | INTRAVENOUS | Status: AC | PRN
  Administered 2015-06-15: 1.08 via INTRAPERITONEAL

## 2015-06-15 MED ORDER — FAMOTIDINE 20 MG PO TABS
20.0000 mg | ORAL_TABLET | Freq: Once | ORAL | Status: AC
  Administered 2015-06-15: 20 mg via ORAL

## 2015-06-15 MED ORDER — OXYCODONE HCL 5 MG PO TABS
5.0000 mg | ORAL_TABLET | Freq: Once | ORAL | Status: AC | PRN
  Administered 2015-06-15: 5 mg via ORAL

## 2015-06-15 MED ORDER — OXYCODONE HCL 5 MG PO TABS
ORAL_TABLET | ORAL | Status: AC
  Filled 2015-06-15: qty 1

## 2015-06-15 MED ORDER — BUPIVACAINE-EPINEPHRINE (PF) 0.5% -1:200000 IJ SOLN
INTRAMUSCULAR | Status: AC
  Filled 2015-06-15: qty 30

## 2015-06-15 MED ORDER — PROMETHAZINE HCL 25 MG/ML IJ SOLN
6.2500 mg | Freq: Once | INTRAMUSCULAR | Status: AC
  Administered 2015-06-15: 6.25 mg via INTRAVENOUS

## 2015-06-15 MED ORDER — FAMOTIDINE 20 MG PO TABS
ORAL_TABLET | ORAL | Status: AC
  Administered 2015-06-15: 20 mg via ORAL
  Filled 2015-06-15: qty 1

## 2015-06-15 MED ORDER — ONDANSETRON HCL 4 MG/2ML IJ SOLN
INTRAMUSCULAR | Status: AC
  Filled 2015-06-15: qty 2

## 2015-06-15 MED ORDER — FENTANYL CITRATE (PF) 100 MCG/2ML IJ SOLN
INTRAMUSCULAR | Status: AC
  Filled 2015-06-15: qty 2

## 2015-06-15 MED ORDER — SODIUM CHLORIDE 0.9 % IJ SOLN
INTRAMUSCULAR | Status: AC
  Filled 2015-06-15: qty 10

## 2015-06-15 MED ORDER — KETAMINE HCL 10 MG/ML IJ SOLN
INTRAMUSCULAR | Status: DC | PRN
  Administered 2015-06-15: 50 mg via INTRAVENOUS
  Administered 2015-06-15: 20 mg via INTRAVENOUS

## 2015-06-15 MED ORDER — FENTANYL CITRATE (PF) 100 MCG/2ML IJ SOLN
25.0000 ug | INTRAMUSCULAR | Status: DC | PRN
  Administered 2015-06-15 (×2): 25 ug via INTRAVENOUS

## 2015-06-15 MED ORDER — KETOROLAC TROMETHAMINE 30 MG/ML IJ SOLN
INTRAMUSCULAR | Status: DC | PRN
  Administered 2015-06-15: 30 mg via INTRAVENOUS

## 2015-06-15 MED ORDER — BUPIVACAINE-EPINEPHRINE (PF) 0.5% -1:200000 IJ SOLN
INTRAMUSCULAR | Status: DC | PRN
  Administered 2015-06-15: 10 mL via PERINEURAL

## 2015-06-15 MED ORDER — MIDAZOLAM HCL 2 MG/2ML IJ SOLN
INTRAMUSCULAR | Status: DC | PRN
  Administered 2015-06-15: 2 mg via INTRAVENOUS

## 2015-06-15 SURGICAL SUPPLY — 45 items
BLADE SURG 15 STRL LF DISP TIS (BLADE) ×2 IMPLANT
BLADE SURG 15 STRL SS (BLADE) ×2
BNDG COHESIVE 4X5 TAN STRL (GAUZE/BANDAGES/DRESSINGS) ×4 IMPLANT
BULB RESERV EVAC DRAIN JP 100C (MISCELLANEOUS) ×8 IMPLANT
CANISTER SUCT 1200ML W/VALVE (MISCELLANEOUS) ×4 IMPLANT
CHLORAPREP W/TINT 26ML (MISCELLANEOUS) ×4 IMPLANT
CNTNR SPEC 2.5X3XGRAD LEK (MISCELLANEOUS) ×6
CONT SPEC 4OZ STER OR WHT (MISCELLANEOUS) ×6
CONTAINER SPEC 2.5X3XGRAD LEK (MISCELLANEOUS) ×6 IMPLANT
DEVICE DUBIN SPECIMEN MAMMOGRA (MISCELLANEOUS) ×4 IMPLANT
DRAIN CHANNEL JP 19F (MISCELLANEOUS) ×8 IMPLANT
DRAPE LAPAROTOMY TRNSV 106X77 (MISCELLANEOUS) ×4 IMPLANT
DRAPE SHEET LG 3/4 BI-LAMINATE (DRAPES) ×4 IMPLANT
DRAPE UTILITY 15X26 TOWEL STRL (DRAPES) ×8 IMPLANT
ELECT CAUTERY BLADE 6.4 (BLADE) ×4 IMPLANT
ELECT REM PT RETURN 9FT ADLT (ELECTROSURGICAL) ×4
ELECTRODE REM PT RTRN 9FT ADLT (ELECTROSURGICAL) ×2 IMPLANT
GAUZE FLUFF 18X24 1PLY STRL (GAUZE/BANDAGES/DRESSINGS) ×4 IMPLANT
GLOVE PI ORTHOPRO 6.5 (GLOVE) ×12
GLOVE PI ORTHOPRO STRL 6.5 (GLOVE) ×12 IMPLANT
GOWN STRL REUS W/ TWL LRG LVL3 (GOWN DISPOSABLE) ×4 IMPLANT
GOWN STRL REUS W/TWL LRG LVL3 (GOWN DISPOSABLE) ×4
LIQUID BAND (GAUZE/BANDAGES/DRESSINGS) ×4 IMPLANT
MARGIN MAP 10MM (MISCELLANEOUS) ×4 IMPLANT
NDL SAFETY ECLIPSE 18X1.5 (NEEDLE) ×2 IMPLANT
NEEDLE HYPO 18GX1.5 SHARP (NEEDLE) ×2
NEEDLE HYPO 25X1 1.5 SAFETY (NEEDLE) ×8 IMPLANT
PACK BASIN MAJOR ARMC (MISCELLANEOUS) ×4 IMPLANT
PACK BASIN MINOR ARMC (MISCELLANEOUS) ×4 IMPLANT
SLEVE PROBE SENORX GAMMA FIND (MISCELLANEOUS) ×8 IMPLANT
STOCKINETTE IMPERVIOUS 9X36 MD (GAUZE/BANDAGES/DRESSINGS) ×4 IMPLANT
SURGI-BRA LG (MISCELLANEOUS) ×4 IMPLANT
SUT ETHILON 3-0 FS-10 30 BLK (SUTURE) ×4
SUT MNCRL 3-0 UNDYED SH (SUTURE) ×4 IMPLANT
SUT MNCRL 4-0 (SUTURE) ×2
SUT MNCRL 4-0 27XMFL (SUTURE) ×2
SUT MONOCRYL 3-0 UNDYED (SUTURE) ×4
SUT SILK 2 0 SH (SUTURE) ×4 IMPLANT
SUT VIC AB 0 CT1 27 (SUTURE) ×2
SUT VIC AB 0 CT1 27XCR 8 STRN (SUTURE) ×2 IMPLANT
SUT VIC AB 2-0 CT1 (SUTURE) ×8 IMPLANT
SUTURE EHLN 3-0 FS-10 30 BLK (SUTURE) ×2 IMPLANT
SUTURE MNCRL 4-0 27XMF (SUTURE) ×2 IMPLANT
SYRINGE 10CC LL (SYRINGE) ×8 IMPLANT
WATER STERILE IRR 1000ML POUR (IV SOLUTION) ×4 IMPLANT

## 2015-06-15 NOTE — Discharge Instructions (Signed)

## 2015-06-15 NOTE — Anesthesia Preprocedure Evaluation (Signed)
Anesthesia Evaluation  Patient identified by MRN, date of birth, ID band Patient awake    Reviewed: Allergy & Precautions, H&P , NPO status , Patient's Chart, lab work & pertinent test results  History of Anesthesia Complications Negative for: history of anesthetic complications  Airway Mallampati: III  TM Distance: >3 FB Neck ROM: full    Dental  (+) Poor Dentition, Chipped   Pulmonary neg pulmonary ROS, neg shortness of breath,    Pulmonary exam normal breath sounds clear to auscultation       Cardiovascular Exercise Tolerance: Good hypertension, (-) angina(-) Past MI and (-) DOE Normal cardiovascular exam Rhythm:regular Rate:Normal     Neuro/Psych  Headaches, negative psych ROS   GI/Hepatic negative GI ROS, Neg liver ROS,   Endo/Other  Morbid obesity  Renal/GU negative Renal ROS  negative genitourinary   Musculoskeletal   Abdominal   Peds  Hematology negative hematology ROS (+)   Anesthesia Other Findings Past Medical History:   Hypertension                                                 Headache                                                       Comment:migraines  Past Surgical History:   [place]           Comment:x1   TUBAL LIGATION                                   2004         BREAST EXCISIONAL BIOPSY                        Right 06/15/15        Comment:breast cancer   Signs and symptoms suggestive of sleep apnea    Reproductive/Obstetrics negative OB ROS                             Anesthesia Physical Anesthesia Plan  ASA: III  Anesthesia Plan: General ETT   Post-op Pain Management:    Induction:   Airway Management Planned:   Additional Equipment:   Intra-op Plan:   Post-operative Plan:   Informed Consent: I have reviewed the patients History and Physical, chart, labs and discussed the procedure including  the risks, benefits and alternatives for the proposed anesthesia with the patient or authorized representative who has indicated his/her understanding and acceptance.   Dental Advisory Given  Plan Discussed with: Anesthesiologist, CRNA and Surgeon  Anesthesia Plan Comments:         Anesthesia Quick Evaluation

## 2015-06-15 NOTE — Anesthesia Procedure Notes (Signed)
Procedure Name: Intubation Date/Time: 06/15/2015 1:17 PM Performed by: Rosaria Ferries, Tereasa Yilmaz Pre-anesthesia Checklist: Patient identified, Emergency Drugs available, Suction available and Patient being monitored Patient Re-evaluated:Patient Re-evaluated prior to inductionOxygen Delivery Method: Circle system utilized Preoxygenation: Pre-oxygenation with 100% oxygen Intubation Type: IV induction Laryngoscope Size: Mac and 3 Grade View: Grade I Tube type: Oral Tube size: 7.0 mm Number of attempts: 1 Placement Confirmation: ETT inserted through vocal cords under direct vision,  positive ETCO2 and breath sounds checked- equal and bilateral Secured at: 22 cm Tube secured with: Tape Dental Injury: Teeth and Oropharynx as per pre-operative assessment

## 2015-06-15 NOTE — Op Note (Signed)
Breast Lumpectomy with Sentinal Node Biopsy Procedure Note  Indications: This patient presents with history of right breast cancer with clinically negative axillary lymph node exam.  Pre-operative Diagnosis: right breast cancer  Post-operative Diagnosis: right breast cancer  Surgeon: Hubbard Robinson   Assistants: none  Anesthesia: General endotracheal anesthesia  ASA Class: 2  Procedure Details  The patient was seen in the Holding Room. The risks, benefits, complications, treatment options, and expected outcomes were discussed with the patient. The possibilities of reaction to medication, pulmonary aspiration, bleeding, infection, the need for additional procedures, failure to diagnose a condition, and creating a complication requiring transfusion or operation were discussed with the patient. The patient concurred with the proposed plan, giving informed consent.  The site of surgery properly noted/marked. The patient was taken to the operating room, identified as Michelle Mcmillan and the procedure verified as Breast Lumpectomy and Sentinal Node Biopsy. A Time Out was held and the above information confirmed.  After induction of anesthesia, the right arm, breast, and chest were prepped and draped in standard fashion.  A previous needle localization wire had been placed by radiology.  An oblique incision was created below the axillary hairline and around the needle wire.  The lumpectomy was performed through this incision by dissecting around the previously placed localization guidewire.  Dissection was carried down to the pectoral fascia.  Orientation sutures were placed and the specimen sent to radiology confirmed inclusion of the mammographic lesion as well as to pathology to confirm good margins.    Using a hand-held gamma probe, axillary sentinel nodes were identified transcutaneously.Dissection was carried through the clavipectoral fascia.  Two axillary sentinel nodes were removed and  submitted to pathology, with reads of 72 and 5 on the gamma probe.  Hemostasis was achieved with cautery.  The wound was irrigated and closed with a 4-0 Monocryl subcuticular closure in layers.      Sterile dressings were applied. At the end of the operation, all sponge, instrument, and needle counts were correct.  Findings: grossly clear surgical margins  Estimated Blood Loss:  less than 50 mL         Drains: none         Total IV Fluids: 1078ml         Specimens: right breast lumpectomy and right axillary sentinel lymph nodes #1 and 2         Implants: none         Complications:  None; patient tolerated the procedure well.         Disposition: PACU - hemodynamically stable.         Condition: stable

## 2015-06-15 NOTE — Transfer of Care (Signed)
Immediate Anesthesia Transfer of Care Note  Patient: Michelle Mcmillan  Procedure(s) Performed: Procedure(s): BREAST LUMPECTOMY WITH NEEDLE LOCALIZATION (Right) SENTINEL NODE BIOPSY (Right) TOTAL MASTECTOMY (Bilateral)  Patient Location: PACU  Anesthesia Type:General  Level of Consciousness: awake and patient cooperative  Airway & Oxygen Therapy: Patient Spontanous Breathing and Patient connected to nasal cannula oxygen  Post-op Assessment: Report given to RN and Post -op Vital signs reviewed and stable  Post vital signs: Reviewed and stable  Last Vitals:  Filed Vitals:   06/15/15 1053  BP: 159/90  Pulse: 64  Temp: 36.7 C  Resp: 16    Complications: No apparent anesthesia complications

## 2015-06-15 NOTE — H&P (View-Only) (Signed)
Subjective:     Patient ID: Michelle Mcmillan, female   DOB: 10-Jul-1970, 45 y.o.   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 A shim was doing her normal screening mammograms whenever this was found and biopsied. Patient states that she has not noted any skin changes dimpling nipple discharge or any other changes. Patient states that she did have a place they were following in her left breast with her yearly mammograms however she has never had any biopsies and not had any abnormalities in the right breast in total now.  Patient does not have any family history of any breast cancers and no history of any other type of cancers. Patient started her period around the age of 10 she's had a total of 4 children with the first being at the age of 17. Patient breast fed her children and patient did use birth control pills for about 20 years until her tubal ligation in 2004.    Past Medical History  Diagnosis Date  . Hypertension    Past Surgical History  Procedure Laterality Date  . Cesarean section  1988    x1  . Tubal ligation  2004   Family History  Problem Relation Age of Onset  . Breast cancer Neg Hx   . Hypertension Mother   . Hypertension Father   . Hypertension Sister    Social History   Social History  . Marital Status: Married    Spouse Name: N/A  . Number of Children: N/A  . Years of Education: N/A   Social History Main Topics  . Smoking status: Never Smoker   . Smokeless tobacco: Never Used  . Alcohol Use: No  . Drug Use: No  . Sexual Activity: Not Asked   Other Topics Concern  . None   Social History Narrative    Current outpatient prescriptions:  .  hydrochlorothiazide (HYDRODIURIL) 25 MG tablet, Take 25 mg by mouth daily., Disp: , Rfl:  No Known Allergies    Review of Systems  Constitutional: Negative for  fever, chills, activity change, appetite change and fatigue.  HENT: Negative for congestion and sore throat.   Respiratory: Negative for cough, choking, chest tightness, shortness of breath and wheezing.   Cardiovascular: Negative for chest pain, palpitations and leg swelling.  Gastrointestinal: Negative for nausea, vomiting, abdominal pain, constipation and abdominal distention.  Genitourinary: Negative for dysuria and hematuria.  Musculoskeletal: Negative for back pain and neck pain.  Skin: Negative for color change, pallor, rash and wound.  Neurological: Negative for dizziness and weakness.  Hematological: Negative for adenopathy. Does not bruise/bleed easily.  Psychiatric/Behavioral: Negative for agitation. The patient is nervous/anxious.   All other systems reviewed and are negative.      Filed Vitals:   05/24/15 1437  BP: 152/85  Pulse: 55  Temp: 98.6 F (37 C)     Objective:   Physical Exam  Constitutional: She is oriented to person, place, and time. She appears well-developed and well-nourished. No distress.  HENT:  Head: Normocephalic and atraumatic.  Right Ear: External ear normal.  Left Ear: External ear normal.  Nose: Nose normal.  Mouth/Throat: Oropharynx is clear and moist. No oropharyngeal exudate.  Eyes: Conjunctivae and EOM are normal. Pupils are equal, round, and reactive to light. No scleral icterus.  Neck: Normal range of motion. Neck supple. No tracheal deviation present.  Cardiovascular: Normal rate, regular rhythm, normal heart sounds and intact distal pulses.  Exam reveals no gallop and no friction rub.   No murmur heard. Pulmonary/Chest: Effort normal and breath sounds normal. No respiratory distress. She has no wheezes. She has no rales. She exhibits no tenderness.  Abdominal: Soft. Bowel sounds are normal. She exhibits no distension. There is no tenderness. There is no rebound and no guarding.  Musculoskeletal: Normal range of motion. She exhibits no  edema or tenderness.  Neurological: She is alert and oriented to person, place, and time. No cranial nerve deficit.  Skin: Skin is warm and dry. No rash noted. No erythema. No pallor.  Psychiatric: She has a normal mood and affect. Her behavior is normal. Judgment and thought content normal.  Vitals reviewed. Breast Exam:   Right breast: No skin changes nipple discharge, no palpable masses. Some tenderness in the right upper outer quadrant where patient states biopsy was taken previously. 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 The risk of recurrence is similar between mastectomy and lumpectomy with radiation. I also discussed that given the small size of the cancer would recommend a needle localization lumpectomy with radiation to follow. I also discussed that we would need to do a sentinel lymph node biopsy to check the nodes. Explained to the patient that after her surgical treatment she would also need an aromatase inhibitor since her tumor was ER/PR +.   I discussed risks of bleeding, infection, damage to surrounding tissues, having positive margins, needing further resection, seroma, hematoma, need for drainage, damage to nerves causing arm numbness or difficulty raising arm, causing lymphoedema in the arm; as well as anesthesia risks of MI, stroke, prolonged ventilation, pulmonary embolism, thrombosis and even death.  I also briefly discussed with the patient that she is young for breast cancer and that genetic testing would need to be done.  She had the  bloodwork for this drawn earlier in the week. I discussed with the patient that if she did come back BRCA positive that I would give her the option of having a bilateral mastectomy with reconstruction. I discussed with the patient that it would be rare for her to be back positive giving a negative family history that if she was that her risk of recurrence would be much higher. We will contact her with these results and if they're negative we will proceed with the lumpectomy with radiation. If they're positive then I'll have her return to discuss surgical option she would like to take. Patient was given the opportunity to ask questions and have them answered.

## 2015-06-15 NOTE — Interval H&P Note (Signed)
History and Physical Interval Note:  06/15/2015 1:00 PM  Michelle Mcmillan  has presented today for surgery, with the diagnosis of RIGHT BREAST INVASIVE CARCINOMA  The various methods of treatment have been discussed with the patient and family. After consideration of risks, benefits and other options for treatment, the patient has consented to  Procedure(s): BREAST LUMPECTOMY WITH NEEDLE LOCALIZATION (Right) SENTINEL NODE BIOPSY (Right) TOTAL MASTECTOMY (Bilateral) as a surgical intervention .  The patient's history has been reviewed, patient examined, no change in status, stable for surgery.  I have reviewed the patient's chart and labs.  Questions were answered to the patient's satisfaction.     Catherine L Loflin

## 2015-06-15 NOTE — Anesthesia Postprocedure Evaluation (Signed)
Anesthesia Post Note  Patient: Michelle Mcmillan  Procedure(s) Performed: Procedure(s) (LRB): BREAST LUMPECTOMY WITH NEEDLE LOCALIZATION (Right) SENTINEL NODE BIOPSY (Right) TOTAL MASTECTOMY (Bilateral)  Patient location during evaluation: PACU Anesthesia Type: General Level of consciousness: awake and alert Pain management: pain level controlled Vital Signs Assessment: post-procedure vital signs reviewed and stable Respiratory status: spontaneous breathing, nonlabored ventilation, respiratory function stable and patient connected to nasal cannula oxygen Cardiovascular status: blood pressure returned to baseline and stable Postop Assessment: no signs of nausea or vomiting Anesthetic complications: no    Last Vitals:  Filed Vitals:   06/15/15 1652 06/15/15 1704  BP: 147/89 137/80  Pulse: 51 61  Temp:    Resp: 16     Last Pain:  Filed Vitals:   06/15/15 1706  PainSc: 5                  Broadus John K Piscitello

## 2015-06-16 ENCOUNTER — Encounter: Payer: Self-pay | Admitting: Surgery

## 2015-06-19 LAB — SURGICAL PATHOLOGY

## 2015-06-19 NOTE — Progress Notes (Signed)
Rescheduled follow-up appointment with Dr.  Grayland Ormond post-op.  Oncotype results pending.  BCCM approved.  Oncology Nurse Navigator Documentation  Navigator Location: [facility] (06/19/15 1000) Navigator Encounter Type: Telephone (06/19/15 1000)       Surgery Date: 06/15/15 (06/19/15 1000)   Patient Visit Type: Follow-up (06/19/15 1000) Treatment Phase: Pre-Tx/Tx Discussion (06/19/15 1000)     Interventions: Coordination of Care (06/19/15 1000)   Coordination of Care:  (post-op appt.) (06/19/15 1000)        Acuity: Level 2 (06/19/15 1000)         Time Spent with Patient: 30 (06/19/15 1000)

## 2015-06-21 ENCOUNTER — Inpatient Hospital Stay: Payer: Medicaid Other | Admitting: Oncology

## 2015-06-29 ENCOUNTER — Ambulatory Visit (INDEPENDENT_AMBULATORY_CARE_PROVIDER_SITE_OTHER): Payer: Self-pay | Admitting: Surgery

## 2015-06-29 ENCOUNTER — Encounter: Payer: Self-pay | Admitting: Surgery

## 2015-06-29 VITALS — BP 156/104 | HR 62 | Temp 98.4°F | Ht 69.0 in | Wt 318.0 lb

## 2015-06-29 DIAGNOSIS — C50911 Malignant neoplasm of unspecified site of right female breast: Secondary | ICD-10-CM

## 2015-06-29 NOTE — Patient Instructions (Addendum)
Make sure that you take your Blood Pressure medication consistently, your blood pressure is extremely high today.  We will see you back in 6 months for a mammogram and follow-up appointment. I will call you when it is time for this to be scheduled.  Please call with ANY questions or concerns prior to coming back for your next appointment.

## 2015-06-29 NOTE — Progress Notes (Signed)
45 yr old female with Right breast  Invasive mammary carcinoma, status post lumpectomy on March 16. Good margins on the tissue with 2 lymph nodes that were negative for cancer 1 with a small focus of LCIS. And states that she is doing well only a little bit of tenderness around the incision site patient states no drainage in the area. Patient states she is able to move her arm around without any difficulty.  Filed Vitals:   06/29/15 1044  BP: 156/104  Pulse: 62  Temp: 98.4 F (36.9 C)   PE:  Gen: NAD, slightly tearful  Right breast: lateral incision 4cm in size, clean, dry, intact no drainage and no erythema  A/P: I discussed with the patient that given the fact is also lateral only one incision was made that she will get radiation to the breast but will also basically be to the axilla at the same time.  I discussed with her that a small focus of LCIS usually would be taken out but did not need any chemotherapy to treat and at most would do some axillary radiation for this. Patient is ER/PR positive and so does need an aromatase inhibitor and she is going to see Dr. Grayland Ormond next week. I will see her in July with a mammogram for follow-up and breast exam.

## 2015-07-06 ENCOUNTER — Inpatient Hospital Stay: Payer: Medicaid Other | Attending: Oncology | Admitting: Oncology

## 2015-07-06 VITALS — BP 128/88 | HR 76 | Temp 98.0°F | Wt 321.2 lb

## 2015-07-06 DIAGNOSIS — Z9013 Acquired absence of bilateral breasts and nipples: Secondary | ICD-10-CM | POA: Insufficient documentation

## 2015-07-06 DIAGNOSIS — I10 Essential (primary) hypertension: Secondary | ICD-10-CM | POA: Insufficient documentation

## 2015-07-06 DIAGNOSIS — C50411 Malignant neoplasm of upper-outer quadrant of right female breast: Secondary | ICD-10-CM | POA: Insufficient documentation

## 2015-07-06 DIAGNOSIS — Z170 Estrogen receptor positive status [ER+]: Secondary | ICD-10-CM | POA: Diagnosis not present

## 2015-07-06 DIAGNOSIS — Z7982 Long term (current) use of aspirin: Secondary | ICD-10-CM | POA: Insufficient documentation

## 2015-07-06 DIAGNOSIS — Z79899 Other long term (current) drug therapy: Secondary | ICD-10-CM | POA: Insufficient documentation

## 2015-07-06 DIAGNOSIS — C50911 Malignant neoplasm of unspecified site of right female breast: Secondary | ICD-10-CM

## 2015-07-06 NOTE — Progress Notes (Signed)
  Oncology Nurse Navigator Documentation    Navigator Encounter Type: Follow-up Appt (07/06/15 1600)           Patient Visit Type: MedOnc (07/06/15 1600)       Interventions: Coordination of Care (07/06/15 1600)   Coordination of Care:  (Radiation) (07/06/15 1600)        Acuity: Level 2 (07/06/15 1600)   Acuity Level 2: Initial guidance, education and coordination as needed;Educational needs;Ongoing guidance and education throughout treatment as needed (07/06/15 1600)     Time Spent with Patient: 45 (07/06/15 1600)

## 2015-07-06 NOTE — Progress Notes (Signed)
Patient is here for follow and did have her surgery on 06/15/15.

## 2015-07-12 ENCOUNTER — Ambulatory Visit: Admission: RE | Admit: 2015-07-12 | Payer: Medicaid Other | Source: Ambulatory Visit | Admitting: Radiation Oncology

## 2015-07-13 NOTE — Progress Notes (Signed)
[facility]  [phone] [phone]  ID: Michelle Mcmillan OB: 03/06/1971  MR#: [id number]  CSN#:[id number]  Patient Care Team: Ngwe A Aycock, MD as PCP - General (Family Medicine) Sheena M Lambert, RN as Registered Nurse Anne F Shaver, RN as Registered Nurse  CHIEF COMPLAINT:  Chief Complaint  Patient presents with  . Breast Cancer    INTERVAL HISTORY: Patient returns to clinic today for further evaluation and discussion of her Oncotype and genetic testing. She tolerated her lumpectomy well and feels nearly back to her baseline. She currently feels well and is asymptomatic. She has no neurologic complaints. She denies any recent fevers or illnesses. She has a good appetite and denies weight loss. She denies any pain. She has no chest pain or shortness of breath. She denies any nausea, vomiting, constipation, or diarrhea. She has no urinary complaints. Patient offers no specific complaints today.  REVIEW OF SYSTEMS:   Review of Systems  Constitutional: Negative.  Negative for fever, weight loss and malaise/fatigue.  Respiratory: Negative.  Negative for shortness of breath.   Cardiovascular: Negative.  Negative for chest pain.  Gastrointestinal: Negative.   Genitourinary: Negative.   Musculoskeletal: Negative.   Neurological: Negative.  Negative for weakness.    As per HPI. Otherwise, a complete review of systems is negatve.  PAST MEDICAL HISTORY: Past Medical History  Diagnosis Date  . Hypertension   . Headache     migraines    PAST SURGICAL HISTORY: Past Surgical History  Procedure Laterality Date  . Cesarean section  1988    x1  . Tubal ligation  2004  . Breast excisional biopsy Right 06/15/15    breast cancer  . Breast lumpectomy with needle localization Right 06/15/2015    Procedure: BREAST LUMPECTOMY WITH NEEDLE LOCALIZATION;  Surgeon: Catherine L Loflin, MD;  Location: [facility] OR[facility];  Service: General;  Laterality: Right;  .  Sentinel node biopsy Right 06/15/2015    Procedure: SENTINEL NODE BIOPSY;  Surgeon: Catherine L Loflin, MD;  Location: [facility] OR[facility];  Service: General;  Laterality: Right;  . Total mastectomy Bilateral 06/15/2015    Procedure: TOTAL MASTECTOMY;  Surgeon: Catherine L Loflin, MD;  Location: [facility];  Service: General;  Laterality: Bilateral;    FAMILY HISTORY Family History  Problem Relation Age of Onset  . Breast cancer Neg Hx   . Hypertension Mother   . Hypertension Father   . Hypertension Sister        ADVANCED DIRECTIVES:    HEALTH MAINTENANCE: Social History  Substance Use Topics  . Smoking status: Never Smoker   . Smokeless tobacco: Never Used  . Alcohol Use: No     Colonoscopy:  PAP:  Bone density:  Lipid panel:  No Known Allergies  Current Outpatient Prescriptions  Medication Sig Dispense Refill  . aspirin-acetaminophen-caffeine (EXCEDRIN MIGRAINE) 250-250-65 MG tablet Take by mouth as needed for headache.    . hydrochlorothiazide (HYDRODIURIL) 25 MG tablet Take 25 mg by mouth daily.    . ibuprofen (ADVIL,MOTRIN) 200 MG tablet Take 200 mg by mouth as needed.     No current facility-administered medications for this visit.    OBJECTIVE: Filed Vitals:   07/06/15 1022  BP: 128/88  Pulse: 76  Temp: 98 F (36.7 C)     Body mass index is 47.41 kg/(m^2).    ECOG FS:0 - Asymptomatic  General: Well-developed, well-nourished, no acute distress. Eyes: Pink conjunctiva, anicteric sclera. Breasts: Patient requested exam be deferred today. Lungs: Clear to auscultation bilaterally.   Heart: Regular rate and rhythm. No rubs, murmurs, or gallops. Abdomen: Soft, nontender, nondistended. No organomegaly noted, normoactive bowel sounds. Musculoskeletal: No edema, cyanosis, or clubbing. Neuro: Alert, answering all questions appropriately. Cranial nerves grossly intact. Skin: No rashes or petechiae noted. Psych: Normal affect.   LAB RESULTS:  Lab Results  Component Value  Date   NA 140 05/29/2015   K 3.4* 05/29/2015   CL 105 05/29/2015   CO2 30 05/29/2015   GLUCOSE 98 05/29/2015   BUN 14 05/29/2015   CREATININE 0.90 05/29/2015   CALCIUM 8.6* 05/29/2015   PROT 7.0 05/29/2015   ALBUMIN 3.5 05/29/2015   AST 23 05/29/2015   ALT 19 05/29/2015   ALKPHOS 89 05/29/2015   BILITOT 0.4 05/29/2015   GFRNONAA >60 05/29/2015   GFRAA >60 05/29/2015    Lab Results  Component Value Date   WBC 8.1 05/29/2015   NEUTROABS 5.7 05/29/2015   HGB 12.2 05/29/2015   HCT 37.6 05/29/2015   MCV 79.9* 05/29/2015   PLT 219 05/29/2015     STUDIES: Nm Sentinel Node Inj-no Rpt (breast)  06/15/2015  CLINICAL DATA: Right breast cancer Sulfur colloid was injected intradermally by the nuclear medicine technologist for breast cancer sentinel node localization.   Mm Breast Surgical Specimen  06/15/2015  CLINICAL DATA:  Evaluate surgical specimen from right lumpectomy for breast cancer. EXAM: SPECIMEN RADIOGRAPH OF THE RIGHT BREAST COMPARISON:  Previous exam(s). FINDINGS: Status post excision of the right breast. The wire tip and biopsy marker clip are present and are marked for pathology. IMPRESSION: Specimen radiograph of the right breast. Electronically Signed   By: Margarette Canada M.D.   On: 06/15/2015 14:13   Mm Rt Plc Breast Loc Dev   1st Lesion  Inc Mammo Guide  06/15/2015  CLINICAL DATA:  45 year old female for wire localization of right breast cancer prior to right lumpectomy. EXAM: NEEDLE LOCALIZATION OF THE RIGHT BREAST WITH MAMMO GUIDANCE COMPARISON:  Previous exams. FINDINGS: Patient presents for needle localization prior to right lumpectomy. I met with the patient and we discussed the procedure of needle localization including benefits and alternatives. We discussed the high likelihood of a successful procedure. We discussed the risks of the procedure, including infection, bleeding, tissue injury, and further surgery. Informed, written consent was given. The usual time-out  protocol was performed immediately prior to the procedure. Using mammographic guidance, sterile technique, 1% lidocaine and a 5 cm modified Kopans needle, the biopsy clip was localized using a lateral approach. The images were marked for Dr. Azalee Course. IMPRESSION: Needle localization right breast. No apparent complications. Electronically Signed   By: Margarette Canada M.D.   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 BRCA 1 and 2 negative, Oncotype DX score 17 which is considered low risk.  PLAN:    1. Breast cancer: Final pathology results reviewed independently. Patient was also found to be low risk on her Oncotype DX score therefore chemotherapy is not necessary. Patient has an appointment with Dr. Baruch Gouty in radiation oncology next week to discuss adjuvant XRT. She will then return to clinic in approximately 8 weeks at the conclusion of her XRT to initiate tamoxifen for 5 years. 2. Genetic testing: Patient was found to be BRCA1 and 2 negative. She expressed understanding that although her genetic testing was negative, her children are at higher risk for breast cancer than the general population.   Approximately 30 minutes was spent in discussion of  which greater than 50% was consultation.  Patient expressed understanding and was in agreement with this plan. She also understands that She can call clinic at any time with any questions, concerns, or complaints.    Lloyd Huger, MD   07/13/2015 10:00 AM

## 2015-07-14 NOTE — Progress Notes (Signed)
  Oncology Nurse Navigator Documentation  Navigator Location: [facility] (07/14/15 1500) Navigator Encounter Type: Telephone;Follow-up Appt (07/14/15 1500)           Patient Visit Type: RadOnc (Missed appt.) (07/14/15 1500) Treatment Phase:  (Consult Radiation Oncologist) (07/14/15 1500) Barriers/Navigation Needs: Coordination of Care (FMLA) (07/14/15 1500)   Interventions: Coordination of Care (07/14/15 1500)   Coordination of Care:  (FMLA) (07/14/15 1500)        Acuity: Level 2 (07/14/15 1500)   Acuity Level 2: Ongoing guidance and education throughout treatment as needed;Assistance expediting appointments (FMLA ) (07/14/15 1500)     Time Spent with Patient: 30 (07/14/15 1500)  Patient no-showed for consult with Dr. Baruch Gouty.  Phoned patient, and she thought appointment was for 07/13/15.  Rescheduled appointment for 07/17/15 at 0900.  Working with patient on intermittent leave from work if necessary.  She has not been employed at current job long enough to qualify for [employer], but did bring paper disability paperwork to be completed.  Will discuss with Dr. Baruch Gouty, and Dr. Grayland Ormond.  She is complaining of fatigue, and some shortness of breath on exertion.  To come to ED if worsens, if not will assess at appointment on Monday.

## 2015-07-17 ENCOUNTER — Encounter: Payer: Self-pay | Admitting: Radiation Oncology

## 2015-07-17 ENCOUNTER — Ambulatory Visit
Admission: RE | Admit: 2015-07-17 | Discharge: 2015-07-17 | Disposition: A | Discharge status: Home / Self Care | Payer: Medicaid Other | Source: Ambulatory Visit | Attending: Radiation Oncology | Admitting: Radiation Oncology

## 2015-07-17 VITALS — BP 139/95 | HR 64 | Temp 98.5°F | Ht 69.0 in | Wt 323.5 lb

## 2015-07-17 DIAGNOSIS — I10 Essential (primary) hypertension: Secondary | ICD-10-CM | POA: Insufficient documentation

## 2015-07-17 DIAGNOSIS — C50411 Malignant neoplasm of upper-outer quadrant of right female breast: Secondary | ICD-10-CM | POA: Insufficient documentation

## 2015-07-17 DIAGNOSIS — Z79899 Other long term (current) drug therapy: Secondary | ICD-10-CM | POA: Insufficient documentation

## 2015-07-17 DIAGNOSIS — E669 Obesity, unspecified: Secondary | ICD-10-CM | POA: Insufficient documentation

## 2015-07-17 DIAGNOSIS — Z170 Estrogen receptor positive status [ER+]: Secondary | ICD-10-CM | POA: Insufficient documentation

## 2015-07-17 DIAGNOSIS — C50911 Malignant neoplasm of unspecified site of right female breast: Secondary | ICD-10-CM

## 2015-07-17 DIAGNOSIS — Z510 Encounter for antineoplastic radiation therapy: Secondary | ICD-10-CM | POA: Insufficient documentation

## 2015-07-17 NOTE — Consult Note (Signed)
Except an outstanding is perfect of Radiation Oncology NEW PATIENT EVALUATION  Name: Michelle Mcmillan  MRN: [id number]  Date:   07/17/2015     DOB: [birth year]   This 45 y.o. female patient presents to the clinic for initial evaluation of stage I breast cancer status post wide local excision and sentinel node biopsy of the  right breast. ER/PR positive HER-2/neu negative with Oncotype DX showing low risk of recurrence  REFERRING PHYSICIAN: Donnie Coffin, MD  CHIEF COMPLAINT:  Chief Complaint  Patient presents with  . Breast Cancer    Initial consult regarding radiaiton treatment    DIAGNOSIS: The encounter diagnosis was Malignant neoplasm of right female breast, unspecified site of breast ([place]).   PREVIOUS INVESTIGATIONS:  Mammograms ultrasound reviewed Pathology report reviewed Clinical notes reviewed  HPI: Patient is a 45 year old female who presented with an abnormal mammogram of her right breast showing a suspicious irregular mass with spiculated margins in the 10:00 position 13 cm from the nipple measuring approximately 1.1 cm. This was confirmed on ultrasound. She underwent stereotactic guided biopsy which was positive for invasive mammary carcinoma ER/PR positive HER-2/neu negative. She underwent a wide local excision and sentinel node biopsy through the same incision showing a 1.1 cm area of invasive mammary carcinoma overall grade 2. One sentinel lymph node was negative second sentinel lymph node had no lymph node tissue but was a focus of lobular carcinoma in situ which is interesting. Margins were clear at 3 mm. Patient will Oncotype DX showing a low risk of recurrence score at 17. Adjuvant chemotherapy was not recommended. She has done well postoperatively. She specifically denies breast tenderness cough or bone pain.   PLANNED TREATMENT REGIMEN: Whole breast radiation to the right breast  PAST MEDICAL HISTORY:  has a past medical history of Hypertension and Headache.    PAST  SURGICAL HISTORY:  Past Surgical History  Procedure Laterality Date  . Cesarean section  1988    x1  . Tubal ligation  2004  . Breast excisional biopsy Right 06/15/15    breast cancer  . Breast lumpectomy with needle localization Right 06/15/2015    Procedure: BREAST LUMPECTOMY WITH NEEDLE LOCALIZATION;  Surgeon: Hubbard Robinson, MD;  Location: [facility];  Service: General;  Laterality: Right;  . Sentinel node biopsy Right 06/15/2015    Procedure: SENTINEL NODE BIOPSY;  Surgeon: Hubbard Robinson, MD;  Location: [facility];  Service: General;  Laterality: Right;  . Total mastectomy Bilateral 06/15/2015    Procedure: TOTAL MASTECTOMY;  Surgeon: Hubbard Robinson, MD;  Location: [facility];  Service: General;  Laterality: Bilateral;    FAMILY HISTORY: family history includes Hypertension in her father, mother, and sister. There is no history of Breast cancer.  SOCIAL HISTORY:  reports that she has never smoked. She has never used smokeless tobacco. She reports that she does not drink alcohol or use illicit drugs.  ALLERGIES: Review of patient's allergies indicates no known allergies.  MEDICATIONS:  Current Outpatient Prescriptions  Medication Sig Dispense Refill  . aspirin-acetaminophen-caffeine (EXCEDRIN MIGRAINE) 250-250-65 MG tablet Take by mouth as needed for headache.    . hydrochlorothiazide (HYDRODIURIL) 25 MG tablet Take 25 mg by mouth daily.    Marland Kitchen ibuprofen (ADVIL,MOTRIN) 200 MG tablet Take 200 mg by mouth as needed.     No current facility-administered medications for this encounter.    ECOG PERFORMANCE STATUS:  0 - Asymptomatic  REVIEW OF SYSTEMS:  Patient denies any weight loss, fatigue, weakness, fever, chills or  night sweats. Patient denies any loss of vision, blurred vision. Patient denies any ringing  of the ears or hearing loss. No irregular heartbeat. Patient denies heart murmur or history of fainting. Patient denies any chest pain or pain radiating to her upper  extremities. Patient denies any shortness of breath, difficulty breathing at night, cough or hemoptysis. Patient denies any swelling in the lower legs. Patient denies any nausea vomiting, vomiting of blood, or coffee ground material in the vomitus. Patient denies any stomach pain. Patient states has had normal bowel movements no significant constipation or diarrhea. Patient denies any dysuria, hematuria or significant nocturia. Patient denies any problems walking, swelling in the joints or loss of balance. Patient denies any skin changes, loss of hair or loss of weight. Patient denies any excessive worrying or anxiety or significant depression. Patient denies any problems with insomnia. Patient denies excessive thirst, polyuria, polydipsia. Patient denies any swollen glands, patient denies easy bruising or easy bleeding. Patient denies any recent infections, allergies or URI. Patient "s visual fields have not changed significantly in recent time.    PHYSICAL EXAM: BP 139/95 mmHg  Pulse 64  Temp(Src) 98.5 F (36.9 C)  Ht _0  (1.753 m)  Wt 323 lb 8.4 oz (146.75 kg)  BMI 47.75 kg/m2 Well-developed obese female in NAD. Breasts are large and pendulous. She status post a right breast wide local excision with incision healing well. No dominant mass or nodularity is noted in either breast in 2 positions examined. No axillary or supraclavicular adenopathy is identified. Well-developed well-nourished patient in NAD. HEENT reveals PERLA, EOMI, discs not visualized.  Oral cavity is clear. No oral mucosal lesions are identified. Neck is clear without evidence of cervical or supraclavicular adenopathy. Lungs are clear to A&P. Cardiac examination is essentially unremarkable with regular rate and rhythm without murmur rub or thrill. Abdomen is benign with no organomegaly or masses noted. Motor sensory and DTR levels are equal and symmetric in the upper and lower extremities. Cranial nerves II through XII are grossly  intact. Proprioception is intact. No peripheral adenopathy or edema is identified. No motor or sensory levels are noted. Crude visual fields are within normal range.  LABORATORY DATA: Pathology reports reviewed    RADIOLOGY RESULTS: Mammograms and ultrasound reviewed   IMPRESSION: Stage I a invasive mammary carcinoma the right breast status post wide local excision in 45 year old female with small focus of lobular carcinoma in situ also seen. Tumor is ER/PR positive HER-2/neu negative for whole breast radiation  PLAN: At this time I to go ahead with whole breast radiation. Based on patient's large breast size she is not a good candidate for hypofractionated course of treatment. Would plan on delivering 5000 cGy to the right breast boosting her scar another 1400 cGy using electrons. Risks and benefits of treatment including skin reaction fatigue possible inclusion of superficial lung alteration of blood counts all were described in detail to the patient. She seems to comprehend my treatment plan well. I've personally set up and ordered CT simulation later this week. Patient also would be a candidate for aromatase inhibitor therapy after completion of radiation.  I would like to take this opportunity for allowing me to participate in the care of your patient.Armstead Peaks., MD

## 2015-07-17 NOTE — Progress Notes (Signed)
Education to patient and spouse regarding radiation and side effects.  Written and verbal information given, all questions answered to their satisfaction.

## 2015-07-20 ENCOUNTER — Ambulatory Visit
Admission: RE | Admit: 2015-07-20 | Discharge: 2015-07-20 | Disposition: A | Discharge status: Home / Self Care | Payer: Medicaid Other | Source: Ambulatory Visit | Attending: Radiation Oncology | Admitting: Radiation Oncology

## 2015-07-20 DIAGNOSIS — Z170 Estrogen receptor positive status [ER+]: Secondary | ICD-10-CM | POA: Diagnosis not present

## 2015-07-20 DIAGNOSIS — Z79899 Other long term (current) drug therapy: Secondary | ICD-10-CM | POA: Diagnosis not present

## 2015-07-20 DIAGNOSIS — E669 Obesity, unspecified: Secondary | ICD-10-CM | POA: Diagnosis not present

## 2015-07-20 DIAGNOSIS — C50411 Malignant neoplasm of upper-outer quadrant of right female breast: Secondary | ICD-10-CM | POA: Diagnosis not present

## 2015-07-20 DIAGNOSIS — Z510 Encounter for antineoplastic radiation therapy: Secondary | ICD-10-CM | POA: Diagnosis not present

## 2015-07-20 DIAGNOSIS — I10 Essential (primary) hypertension: Secondary | ICD-10-CM | POA: Diagnosis not present

## 2015-07-21 ENCOUNTER — Other Ambulatory Visit: Payer: Self-pay | Admitting: *Deleted

## 2015-07-21 DIAGNOSIS — C50411 Malignant neoplasm of upper-outer quadrant of right female breast: Secondary | ICD-10-CM

## 2015-07-21 DIAGNOSIS — Z510 Encounter for antineoplastic radiation therapy: Secondary | ICD-10-CM | POA: Diagnosis not present

## 2015-07-27 ENCOUNTER — Other Ambulatory Visit: Payer: Self-pay | Admitting: *Deleted

## 2015-07-27 ENCOUNTER — Inpatient Hospital Stay: Payer: Medicaid Other

## 2015-07-27 ENCOUNTER — Ambulatory Visit
Admission: RE | Admit: 2015-07-27 | Discharge: 2015-07-27 | Disposition: A | Discharge status: Home / Self Care | Payer: Medicaid Other | Source: Ambulatory Visit | Attending: Radiation Oncology | Admitting: Radiation Oncology

## 2015-07-27 DIAGNOSIS — C50411 Malignant neoplasm of upper-outer quadrant of right female breast: Secondary | ICD-10-CM | POA: Diagnosis not present

## 2015-07-27 DIAGNOSIS — Z510 Encounter for antineoplastic radiation therapy: Secondary | ICD-10-CM | POA: Diagnosis not present

## 2015-07-27 DIAGNOSIS — C50911 Malignant neoplasm of unspecified site of right female breast: Secondary | ICD-10-CM

## 2015-07-27 LAB — COMPREHENSIVE METABOLIC PANEL
ALBUMIN: 3.6 g/dL (ref 3.5–5.0)
ALT: 12 U/L — ABNORMAL LOW (ref 14–54)
ANION GAP: 7 (ref 5–15)
AST: 18 U/L (ref 15–41)
Alkaline Phosphatase: 76 U/L (ref 38–126)
BILIRUBIN TOTAL: 0.4 mg/dL (ref 0.3–1.2)
BUN: 18 mg/dL (ref 6–20)
CO2: 26 mmol/L (ref 22–32)
Calcium: 8.4 mg/dL — ABNORMAL LOW (ref 8.9–10.3)
Chloride: 105 mmol/L (ref 101–111)
Creatinine, Ser: 0.92 mg/dL (ref 0.44–1.00)
GFR calc non Af Amer: >60 mL/min (ref >60)
GLUCOSE: 77 mg/dL (ref 65–99)
POTASSIUM: 3.6 mmol/L (ref 3.5–5.1)
SODIUM: 138 mmol/L (ref 135–145)
TOTAL PROTEIN: 7.3 g/dL (ref 6.5–8.1)

## 2015-07-27 LAB — CBC WITH DIFFERENTIAL/PLATELET
BASOS PCT: 1 %
Basophils Absolute: 0.1 10*3/uL (ref 0–0.1)
EOS ABS: 0.1 10*3/uL (ref 0–0.7)
Eosinophils Relative: 2 %
HCT: 36.8 % (ref 35.0–47.0)
Hemoglobin: 12.1 g/dL (ref 12.0–16.0)
LYMPHS ABS: 2.1 10*3/uL (ref 1.0–3.6)
Lymphocytes Relative: 25 %
MCH: 26.2 pg (ref 26.0–34.0)
MCHC: 32.8 g/dL (ref 32.0–36.0)
MCV: 79.8 fL — ABNORMAL LOW (ref 80.0–100.0)
MONO ABS: 0.5 10*3/uL (ref 0.2–0.9)
MONOS PCT: 6 %
Neutro Abs: 5.4 10*3/uL (ref 1.4–6.5)
Neutrophils Relative %: 66 %
Platelets: 253 10*3/uL (ref 150–440)
RBC: 4.61 MIL/uL (ref 3.80–5.20)
RDW: 17.4 % — AB (ref 11.5–14.5)
WBC: 8.1 10*3/uL (ref 3.6–11.0)

## 2015-07-27 NOTE — Progress Notes (Signed)
  Oncology Nurse Navigator Documentation  Navigator Location: [facility] (07/27/15 0900) Navigator Encounter Type: Telephone (07/27/15 0900) Telephone: Incoming Call;Outgoing Call;Symptom Mgt;Appt Confirmation/Clarification (07/27/15 0900)         Patient Visit Type: Follow-up (07/27/15 0900) Treatment Phase: Active Tx (07/27/15 0900) Barriers/Navigation Needs: Coordination of Care (07/27/15 0900)   Interventions: Other (labs) (07/27/15 0900)   Coordination of Care: Other (07/27/15 0900)        Acuity: Level 2 (07/27/15 0900)   Acuity Level 2: Assistance expediting appointments;Ongoing guidance and education throughout treatment as needed (07/27/15 0900)     Time Spent with Patient: 30 (07/27/15 0900)  Patient expressing concerns of continuing fatigue, and shortness of breath at times. States she has been anemic in the past.   Discussed with Dr. Grayland Ormond.  Orders for CBC, MET C to be drawn prior to radiation appointment today.  Patient notified to arrive 30 minutes early for lab work.  Will assess at that time.

## 2015-07-31 ENCOUNTER — Ambulatory Visit
Admission: RE | Admit: 2015-07-31 | Discharge: 2015-07-31 | Disposition: A | Discharge status: Home / Self Care | Payer: Medicaid Other | Source: Ambulatory Visit | Attending: Radiation Oncology | Admitting: Radiation Oncology

## 2015-07-31 DIAGNOSIS — Z510 Encounter for antineoplastic radiation therapy: Secondary | ICD-10-CM | POA: Diagnosis not present

## 2015-08-01 ENCOUNTER — Ambulatory Visit
Admission: RE | Admit: 2015-08-01 | Discharge: 2015-08-01 | Disposition: A | Discharge status: Home / Self Care | Payer: Medicaid Other | Source: Ambulatory Visit | Attending: Radiation Oncology | Admitting: Radiation Oncology

## 2015-08-01 DIAGNOSIS — Z510 Encounter for antineoplastic radiation therapy: Secondary | ICD-10-CM | POA: Diagnosis not present

## 2015-08-02 ENCOUNTER — Encounter: Payer: Self-pay | Admitting: *Deleted

## 2015-08-02 ENCOUNTER — Ambulatory Visit
Admission: RE | Admit: 2015-08-02 | Discharge: 2015-08-02 | Disposition: A | Discharge status: Home / Self Care | Payer: Medicaid Other | Source: Ambulatory Visit | Attending: Radiation Oncology | Admitting: Radiation Oncology

## 2015-08-02 DIAGNOSIS — Z510 Encounter for antineoplastic radiation therapy: Secondary | ICD-10-CM | POA: Diagnosis not present

## 2015-08-03 ENCOUNTER — Ambulatory Visit
Admission: RE | Admit: 2015-08-03 | Discharge: 2015-08-03 | Disposition: A | Discharge status: Home / Self Care | Payer: Medicaid Other | Source: Ambulatory Visit | Attending: Radiation Oncology | Admitting: Radiation Oncology

## 2015-08-03 DIAGNOSIS — Z510 Encounter for antineoplastic radiation therapy: Secondary | ICD-10-CM | POA: Diagnosis not present

## 2015-08-04 ENCOUNTER — Ambulatory Visit
Admission: RE | Admit: 2015-08-04 | Discharge: 2015-08-04 | Disposition: A | Discharge status: Home / Self Care | Payer: Medicaid Other | Source: Ambulatory Visit | Attending: Radiation Oncology | Admitting: Radiation Oncology

## 2015-08-04 DIAGNOSIS — Z510 Encounter for antineoplastic radiation therapy: Secondary | ICD-10-CM | POA: Diagnosis not present

## 2015-08-06 ENCOUNTER — Emergency Department: Payer: Medicaid Other

## 2015-08-06 ENCOUNTER — Encounter: Payer: Self-pay | Admitting: *Deleted

## 2015-08-06 ENCOUNTER — Emergency Department
Admission: EM | Admit: 2015-08-06 | Discharge: 2015-08-06 | Disposition: A | Discharge status: Home / Self Care | Payer: Medicaid Other | Attending: Emergency Medicine | Admitting: Emergency Medicine

## 2015-08-06 DIAGNOSIS — S20219A Contusion of unspecified front wall of thorax, initial encounter: Secondary | ICD-10-CM | POA: Diagnosis not present

## 2015-08-06 DIAGNOSIS — Y999 Unspecified external cause status: Secondary | ICD-10-CM | POA: Insufficient documentation

## 2015-08-06 DIAGNOSIS — Z7982 Long term (current) use of aspirin: Secondary | ICD-10-CM | POA: Insufficient documentation

## 2015-08-06 DIAGNOSIS — S40022A Contusion of left upper arm, initial encounter: Secondary | ICD-10-CM

## 2015-08-06 DIAGNOSIS — I10 Essential (primary) hypertension: Secondary | ICD-10-CM | POA: Diagnosis not present

## 2015-08-06 DIAGNOSIS — Y92410 Unspecified street and highway as the place of occurrence of the external cause: Secondary | ICD-10-CM | POA: Insufficient documentation

## 2015-08-06 DIAGNOSIS — Y939 Activity, unspecified: Secondary | ICD-10-CM | POA: Diagnosis not present

## 2015-08-06 DIAGNOSIS — S0093XA Contusion of unspecified part of head, initial encounter: Secondary | ICD-10-CM | POA: Diagnosis not present

## 2015-08-06 DIAGNOSIS — S8002XA Contusion of left knee, initial encounter: Secondary | ICD-10-CM | POA: Insufficient documentation

## 2015-08-06 DIAGNOSIS — S161XXA Strain of muscle, fascia and tendon at neck level, initial encounter: Secondary | ICD-10-CM

## 2015-08-06 DIAGNOSIS — M79602 Pain in left arm: Secondary | ICD-10-CM | POA: Diagnosis present

## 2015-08-06 HISTORY — DX: Malignant (primary) neoplasm, unspecified: C80.1

## 2015-08-06 MED ORDER — OXYCODONE-ACETAMINOPHEN 5-325 MG PO TABS: 1.0000 | ORAL_TABLET | ORAL | Status: DC | PRN

## 2015-08-06 MED ORDER — OXYCODONE-ACETAMINOPHEN 5-325 MG PO TABS
ORAL_TABLET | ORAL | Status: AC
  Administered 2015-08-06: 1 via ORAL
  Filled 2015-08-06: qty 1

## 2015-08-06 MED ORDER — OXYCODONE-ACETAMINOPHEN 5-325 MG PO TABS
1.0000 | ORAL_TABLET | ORAL | Status: DC | PRN
  Administered 2015-08-06: 1 via ORAL

## 2015-08-06 NOTE — Discharge Instructions (Signed)
Ice pack to areas as needed for pain and swelling. Percocet as needed for severe pain. You may also take ibuprofen if needed for inflammation and pain. Do not drive while taking pain medication or operate machinery. Follow-up with your primary care doctor if any continued problems.
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
## 2015-08-07 ENCOUNTER — Ambulatory Visit
Admission: RE | Admit: 2015-08-07 | Discharge: 2015-08-07 | Disposition: A | Discharge status: Home / Self Care | Payer: Medicaid Other | Source: Ambulatory Visit | Attending: Radiation Oncology | Admitting: Radiation Oncology

## 2015-08-07 DIAGNOSIS — Z510 Encounter for antineoplastic radiation therapy: Secondary | ICD-10-CM | POA: Diagnosis not present

## 2015-08-08 ENCOUNTER — Ambulatory Visit
Admission: RE | Admit: 2015-08-08 | Discharge: 2015-08-08 | Disposition: A | Discharge status: Home / Self Care | Payer: Medicaid Other | Source: Ambulatory Visit | Attending: Radiation Oncology | Admitting: Radiation Oncology

## 2015-08-08 DIAGNOSIS — Z510 Encounter for antineoplastic radiation therapy: Secondary | ICD-10-CM | POA: Diagnosis not present

## 2015-08-09 ENCOUNTER — Ambulatory Visit
Admission: RE | Admit: 2015-08-09 | Discharge: 2015-08-09 | Disposition: A | Discharge status: Home / Self Care | Payer: Medicaid Other | Source: Ambulatory Visit | Attending: Radiation Oncology | Admitting: Radiation Oncology

## 2015-08-09 ENCOUNTER — Encounter: Payer: Self-pay | Admitting: Oncology

## 2015-08-09 DIAGNOSIS — Z510 Encounter for antineoplastic radiation therapy: Secondary | ICD-10-CM | POA: Diagnosis not present

## 2015-08-10 ENCOUNTER — Ambulatory Visit
Admission: RE | Admit: 2015-08-10 | Discharge: 2015-08-10 | Disposition: A | Discharge status: Home / Self Care | Payer: Medicaid Other | Source: Ambulatory Visit | Attending: Radiation Oncology | Admitting: Radiation Oncology

## 2015-08-10 ENCOUNTER — Inpatient Hospital Stay: Payer: Medicaid Other | Attending: Oncology

## 2015-08-10 DIAGNOSIS — Z170 Estrogen receptor positive status [ER+]: Secondary | ICD-10-CM | POA: Insufficient documentation

## 2015-08-10 DIAGNOSIS — C50911 Malignant neoplasm of unspecified site of right female breast: Secondary | ICD-10-CM | POA: Insufficient documentation

## 2015-08-10 DIAGNOSIS — Z9013 Acquired absence of bilateral breasts and nipples: Secondary | ICD-10-CM | POA: Diagnosis not present

## 2015-08-10 DIAGNOSIS — C50411 Malignant neoplasm of upper-outer quadrant of right female breast: Secondary | ICD-10-CM | POA: Insufficient documentation

## 2015-08-10 DIAGNOSIS — Z510 Encounter for antineoplastic radiation therapy: Secondary | ICD-10-CM | POA: Diagnosis not present

## 2015-08-10 LAB — CBC
HCT: 37.1 % (ref 35.0–47.0)
Hemoglobin: 12.2 g/dL (ref 12.0–16.0)
MCH: 26.2 pg (ref 26.0–34.0)
MCHC: 32.9 g/dL (ref 32.0–36.0)
MCV: 79.7 fL — ABNORMAL LOW (ref 80.0–100.0)
PLATELETS: 241 10*3/uL (ref 150–440)
RBC: 4.66 MIL/uL (ref 3.80–5.20)
RDW: 17.3 % — AB (ref 11.5–14.5)
WBC: 7.7 10*3/uL (ref 3.6–11.0)

## 2015-08-11 ENCOUNTER — Ambulatory Visit
Admission: RE | Admit: 2015-08-11 | Discharge: 2015-08-11 | Disposition: A | Discharge status: Home / Self Care | Payer: Medicaid Other | Source: Ambulatory Visit | Attending: Radiation Oncology | Admitting: Radiation Oncology

## 2015-08-11 DIAGNOSIS — Z510 Encounter for antineoplastic radiation therapy: Secondary | ICD-10-CM | POA: Diagnosis not present

## 2015-08-14 ENCOUNTER — Ambulatory Visit
Admission: RE | Admit: 2015-08-14 | Discharge: 2015-08-14 | Disposition: A | Discharge status: Home / Self Care | Payer: Medicaid Other | Source: Ambulatory Visit | Attending: Radiation Oncology | Admitting: Radiation Oncology

## 2015-08-14 DIAGNOSIS — Z510 Encounter for antineoplastic radiation therapy: Secondary | ICD-10-CM | POA: Diagnosis not present

## 2015-08-15 ENCOUNTER — Ambulatory Visit
Admission: RE | Admit: 2015-08-15 | Discharge: 2015-08-15 | Disposition: A | Discharge status: Home / Self Care | Payer: Medicaid Other | Source: Ambulatory Visit | Attending: Radiation Oncology | Admitting: Radiation Oncology

## 2015-08-15 DIAGNOSIS — Z510 Encounter for antineoplastic radiation therapy: Secondary | ICD-10-CM | POA: Diagnosis not present

## 2015-08-16 ENCOUNTER — Ambulatory Visit
Admission: RE | Admit: 2015-08-16 | Discharge: 2015-08-16 | Disposition: A | Discharge status: Home / Self Care | Payer: Medicaid Other | Source: Ambulatory Visit | Attending: Radiation Oncology | Admitting: Radiation Oncology

## 2015-08-16 DIAGNOSIS — Z510 Encounter for antineoplastic radiation therapy: Secondary | ICD-10-CM | POA: Diagnosis not present

## 2015-08-17 ENCOUNTER — Ambulatory Visit
Admission: RE | Admit: 2015-08-17 | Discharge: 2015-08-17 | Disposition: A | Discharge status: Home / Self Care | Payer: Medicaid Other | Source: Ambulatory Visit | Attending: Radiation Oncology | Admitting: Radiation Oncology

## 2015-08-17 DIAGNOSIS — Z510 Encounter for antineoplastic radiation therapy: Secondary | ICD-10-CM | POA: Diagnosis not present

## 2015-08-18 ENCOUNTER — Ambulatory Visit
Admission: RE | Admit: 2015-08-18 | Discharge: 2015-08-18 | Disposition: A | Discharge status: Home / Self Care | Payer: Medicaid Other | Source: Ambulatory Visit | Attending: Radiation Oncology | Admitting: Radiation Oncology

## 2015-08-18 DIAGNOSIS — Z510 Encounter for antineoplastic radiation therapy: Secondary | ICD-10-CM | POA: Diagnosis not present

## 2015-08-21 ENCOUNTER — Ambulatory Visit
Admission: RE | Admit: 2015-08-21 | Discharge: 2015-08-21 | Disposition: A | Discharge status: Home / Self Care | Payer: Medicaid Other | Source: Ambulatory Visit | Attending: Radiation Oncology | Admitting: Radiation Oncology

## 2015-08-21 DIAGNOSIS — Z510 Encounter for antineoplastic radiation therapy: Secondary | ICD-10-CM | POA: Diagnosis not present

## 2015-08-22 ENCOUNTER — Ambulatory Visit
Admission: RE | Admit: 2015-08-22 | Discharge: 2015-08-22 | Disposition: A | Discharge status: Home / Self Care | Payer: Medicaid Other | Source: Ambulatory Visit | Attending: Radiation Oncology | Admitting: Radiation Oncology

## 2015-08-22 DIAGNOSIS — Z510 Encounter for antineoplastic radiation therapy: Secondary | ICD-10-CM | POA: Diagnosis not present

## 2015-08-23 ENCOUNTER — Ambulatory Visit
Admission: RE | Admit: 2015-08-23 | Discharge: 2015-08-23 | Disposition: A | Discharge status: Home / Self Care | Payer: Medicaid Other | Source: Ambulatory Visit | Attending: Radiation Oncology | Admitting: Radiation Oncology

## 2015-08-23 DIAGNOSIS — Z510 Encounter for antineoplastic radiation therapy: Secondary | ICD-10-CM | POA: Diagnosis not present

## 2015-08-24 ENCOUNTER — Inpatient Hospital Stay: Payer: Medicaid Other

## 2015-08-24 ENCOUNTER — Ambulatory Visit
Admission: RE | Admit: 2015-08-24 | Discharge: 2015-08-24 | Disposition: A | Discharge status: Home / Self Care | Payer: Medicaid Other | Source: Ambulatory Visit | Attending: Radiation Oncology | Admitting: Radiation Oncology

## 2015-08-24 DIAGNOSIS — C50411 Malignant neoplasm of upper-outer quadrant of right female breast: Secondary | ICD-10-CM | POA: Diagnosis not present

## 2015-08-24 DIAGNOSIS — Z510 Encounter for antineoplastic radiation therapy: Secondary | ICD-10-CM | POA: Diagnosis not present

## 2015-08-24 LAB — CBC
HCT: 36.8 % (ref 35.0–47.0)
Hemoglobin: 11.9 g/dL — ABNORMAL LOW (ref 12.0–16.0)
MCH: 26.1 pg (ref 26.0–34.0)
MCHC: 32.3 g/dL (ref 32.0–36.0)
MCV: 80.8 fL (ref 80.0–100.0)
PLATELETS: 219 10*3/uL (ref 150–440)
RBC: 4.56 MIL/uL (ref 3.80–5.20)
RDW: 17.5 % — AB (ref 11.5–14.5)
WBC: 6.3 10*3/uL (ref 3.6–11.0)

## 2015-08-25 ENCOUNTER — Ambulatory Visit
Admission: RE | Admit: 2015-08-25 | Discharge: 2015-08-25 | Disposition: A | Discharge status: Home / Self Care | Payer: Medicaid Other | Source: Ambulatory Visit | Attending: Radiation Oncology | Admitting: Radiation Oncology

## 2015-08-25 DIAGNOSIS — Z510 Encounter for antineoplastic radiation therapy: Secondary | ICD-10-CM | POA: Diagnosis not present

## 2015-08-29 ENCOUNTER — Ambulatory Visit
Admission: RE | Admit: 2015-08-29 | Discharge: 2015-08-29 | Disposition: A | Discharge status: Home / Self Care | Payer: Medicaid Other | Source: Ambulatory Visit | Attending: Radiation Oncology | Admitting: Radiation Oncology

## 2015-08-29 DIAGNOSIS — Z510 Encounter for antineoplastic radiation therapy: Secondary | ICD-10-CM | POA: Diagnosis not present

## 2015-08-30 ENCOUNTER — Ambulatory Visit
Admission: RE | Admit: 2015-08-30 | Discharge: 2015-08-30 | Disposition: A | Discharge status: Home / Self Care | Payer: Medicaid Other | Source: Ambulatory Visit | Attending: Radiation Oncology | Admitting: Radiation Oncology

## 2015-08-30 DIAGNOSIS — Z510 Encounter for antineoplastic radiation therapy: Secondary | ICD-10-CM | POA: Diagnosis not present

## 2015-08-31 ENCOUNTER — Inpatient Hospital Stay: Payer: Medicaid Other | Attending: Oncology | Admitting: Oncology

## 2015-08-31 ENCOUNTER — Ambulatory Visit
Admission: RE | Admit: 2015-08-31 | Discharge: 2015-08-31 | Disposition: A | Discharge status: Home / Self Care | Payer: Medicaid Other | Source: Ambulatory Visit | Attending: Radiation Oncology | Admitting: Radiation Oncology

## 2015-08-31 VITALS — BP 138/99 | HR 75 | Temp 97.8°F | Resp 18 | Wt 328.7 lb

## 2015-08-31 DIAGNOSIS — Z79899 Other long term (current) drug therapy: Secondary | ICD-10-CM | POA: Insufficient documentation

## 2015-08-31 DIAGNOSIS — I10 Essential (primary) hypertension: Secondary | ICD-10-CM | POA: Diagnosis not present

## 2015-08-31 DIAGNOSIS — Z923 Personal history of irradiation: Secondary | ICD-10-CM

## 2015-08-31 DIAGNOSIS — C50911 Malignant neoplasm of unspecified site of right female breast: Secondary | ICD-10-CM | POA: Diagnosis not present

## 2015-08-31 DIAGNOSIS — Z79810 Long term (current) use of selective estrogen receptor modulators (SERMs): Secondary | ICD-10-CM | POA: Diagnosis not present

## 2015-08-31 DIAGNOSIS — Z170 Estrogen receptor positive status [ER+]: Secondary | ICD-10-CM | POA: Insufficient documentation

## 2015-08-31 DIAGNOSIS — Z510 Encounter for antineoplastic radiation therapy: Secondary | ICD-10-CM | POA: Diagnosis not present

## 2015-08-31 DIAGNOSIS — Z9013 Acquired absence of bilateral breasts and nipples: Secondary | ICD-10-CM | POA: Insufficient documentation

## 2015-08-31 DIAGNOSIS — Z789 Other specified health status: Secondary | ICD-10-CM

## 2015-08-31 HISTORY — DX: Other specified health status: Z78.9

## 2015-08-31 MED ORDER — TAMOXIFEN CITRATE 20 MG PO TABS: 20.0000 mg | ORAL_TABLET | Freq: Every day | ORAL | Status: DC

## 2015-08-31 NOTE — Progress Notes (Signed)
Patient's final XRT is scheduled for 09/19/15.
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
## 2015-09-01 ENCOUNTER — Ambulatory Visit
Admission: RE | Admit: 2015-09-01 | Discharge: 2015-09-01 | Disposition: A | Discharge status: Home / Self Care | Payer: Medicaid Other | Source: Ambulatory Visit | Attending: Radiation Oncology | Admitting: Radiation Oncology

## 2015-09-01 DIAGNOSIS — Z510 Encounter for antineoplastic radiation therapy: Secondary | ICD-10-CM | POA: Diagnosis not present

## 2015-09-04 ENCOUNTER — Ambulatory Visit
Admission: RE | Admit: 2015-09-04 | Discharge: 2015-09-04 | Disposition: A | Discharge status: Home / Self Care | Payer: Medicaid Other | Source: Ambulatory Visit | Attending: Radiation Oncology | Admitting: Radiation Oncology

## 2015-09-04 ENCOUNTER — Other Ambulatory Visit: Payer: Self-pay | Admitting: *Deleted

## 2015-09-04 DIAGNOSIS — Z510 Encounter for antineoplastic radiation therapy: Secondary | ICD-10-CM | POA: Diagnosis not present

## 2015-09-04 MED ORDER — SILVER SULFADIAZINE 1 % EX CREA: 1.0000 "application " | TOPICAL_CREAM | Freq: Two times a day (BID) | CUTANEOUS | Status: DC

## 2015-09-05 ENCOUNTER — Ambulatory Visit
Admission: RE | Admit: 2015-09-05 | Discharge: 2015-09-05 | Disposition: A | Discharge status: Home / Self Care | Payer: Medicaid Other | Source: Ambulatory Visit | Attending: Radiation Oncology | Admitting: Radiation Oncology

## 2015-09-05 DIAGNOSIS — Z510 Encounter for antineoplastic radiation therapy: Secondary | ICD-10-CM | POA: Diagnosis not present

## 2015-09-06 ENCOUNTER — Ambulatory Visit
Admission: RE | Admit: 2015-09-06 | Discharge: 2015-09-06 | Disposition: A | Discharge status: Home / Self Care | Payer: Medicaid Other | Source: Ambulatory Visit | Attending: Radiation Oncology | Admitting: Radiation Oncology

## 2015-09-06 DIAGNOSIS — Z510 Encounter for antineoplastic radiation therapy: Secondary | ICD-10-CM | POA: Diagnosis not present

## 2015-09-07 ENCOUNTER — Inpatient Hospital Stay: Payer: Medicaid Other

## 2015-09-07 ENCOUNTER — Ambulatory Visit
Admission: RE | Admit: 2015-09-07 | Discharge: 2015-09-07 | Disposition: A | Discharge status: Home / Self Care | Payer: Medicaid Other | Source: Ambulatory Visit | Attending: Radiation Oncology | Admitting: Radiation Oncology

## 2015-09-07 DIAGNOSIS — Z510 Encounter for antineoplastic radiation therapy: Secondary | ICD-10-CM | POA: Diagnosis not present

## 2015-09-07 DIAGNOSIS — C50911 Malignant neoplasm of unspecified site of right female breast: Secondary | ICD-10-CM | POA: Diagnosis not present

## 2015-09-07 DIAGNOSIS — C50411 Malignant neoplasm of upper-outer quadrant of right female breast: Secondary | ICD-10-CM

## 2015-09-07 LAB — CBC
HEMATOCRIT: 37.2 % (ref 35.0–47.0)
Hemoglobin: 12.2 g/dL (ref 12.0–16.0)
MCH: 26.3 pg (ref 26.0–34.0)
MCHC: 32.8 g/dL (ref 32.0–36.0)
MCV: 80.2 fL (ref 80.0–100.0)
Platelets: 214 10*3/uL (ref 150–440)
RBC: 4.63 MIL/uL (ref 3.80–5.20)
RDW: 17.6 % — AB (ref 11.5–14.5)
WBC: 7.1 10*3/uL (ref 3.6–11.0)

## 2015-09-08 ENCOUNTER — Ambulatory Visit
Admission: RE | Admit: 2015-09-08 | Discharge: 2015-09-08 | Disposition: A | Discharge status: Home / Self Care | Payer: Medicaid Other | Source: Ambulatory Visit | Attending: Radiation Oncology | Admitting: Radiation Oncology

## 2015-09-08 DIAGNOSIS — Z510 Encounter for antineoplastic radiation therapy: Secondary | ICD-10-CM | POA: Diagnosis not present

## 2015-09-11 ENCOUNTER — Ambulatory Visit
Admission: RE | Admit: 2015-09-11 | Discharge: 2015-09-11 | Disposition: A | Discharge status: Home / Self Care | Payer: Medicaid Other | Source: Ambulatory Visit | Attending: Radiation Oncology | Admitting: Radiation Oncology

## 2015-09-11 DIAGNOSIS — Z510 Encounter for antineoplastic radiation therapy: Secondary | ICD-10-CM | POA: Diagnosis not present

## 2015-09-12 ENCOUNTER — Ambulatory Visit
Admission: RE | Admit: 2015-09-12 | Discharge: 2015-09-12 | Disposition: A | Discharge status: Home / Self Care | Payer: Medicaid Other | Source: Ambulatory Visit | Attending: Radiation Oncology | Admitting: Radiation Oncology

## 2015-09-12 DIAGNOSIS — Z510 Encounter for antineoplastic radiation therapy: Secondary | ICD-10-CM | POA: Diagnosis not present

## 2015-09-13 ENCOUNTER — Ambulatory Visit
Admission: RE | Admit: 2015-09-13 | Discharge: 2015-09-13 | Disposition: A | Discharge status: Home / Self Care | Payer: Medicaid Other | Source: Ambulatory Visit | Attending: Radiation Oncology | Admitting: Radiation Oncology

## 2015-09-13 DIAGNOSIS — Z510 Encounter for antineoplastic radiation therapy: Secondary | ICD-10-CM | POA: Diagnosis not present

## 2015-09-14 ENCOUNTER — Ambulatory Visit
Admission: RE | Admit: 2015-09-14 | Discharge: 2015-09-14 | Disposition: A | Discharge status: Home / Self Care | Payer: Medicaid Other | Source: Ambulatory Visit | Attending: Radiation Oncology | Admitting: Radiation Oncology

## 2015-09-14 DIAGNOSIS — Z510 Encounter for antineoplastic radiation therapy: Secondary | ICD-10-CM | POA: Diagnosis not present

## 2015-09-15 ENCOUNTER — Ambulatory Visit
Admission: RE | Admit: 2015-09-15 | Discharge: 2015-09-15 | Disposition: A | Discharge status: Home / Self Care | Payer: Medicaid Other | Source: Ambulatory Visit | Attending: Radiation Oncology | Admitting: Radiation Oncology

## 2015-09-15 DIAGNOSIS — Z510 Encounter for antineoplastic radiation therapy: Secondary | ICD-10-CM | POA: Diagnosis not present

## 2015-09-18 ENCOUNTER — Ambulatory Visit
Admission: RE | Admit: 2015-09-18 | Discharge: 2015-09-18 | Disposition: A | Discharge status: Home / Self Care | Payer: Medicaid Other | Source: Ambulatory Visit | Attending: Radiation Oncology | Admitting: Radiation Oncology

## 2015-09-18 DIAGNOSIS — Z510 Encounter for antineoplastic radiation therapy: Secondary | ICD-10-CM | POA: Diagnosis not present

## 2015-09-19 ENCOUNTER — Ambulatory Visit
Admission: RE | Admit: 2015-09-19 | Discharge: 2015-09-19 | Disposition: A | Discharge status: Home / Self Care | Payer: Medicaid Other | Source: Ambulatory Visit | Attending: Radiation Oncology | Admitting: Radiation Oncology

## 2015-09-19 ENCOUNTER — Encounter: Payer: Self-pay | Admitting: *Deleted

## 2015-09-19 DIAGNOSIS — Z510 Encounter for antineoplastic radiation therapy: Secondary | ICD-10-CM | POA: Diagnosis not present

## 2015-10-05 ENCOUNTER — Other Ambulatory Visit: Payer: Self-pay | Admitting: *Deleted

## 2015-10-05 DIAGNOSIS — C50911 Malignant neoplasm of unspecified site of right female breast: Secondary | ICD-10-CM

## 2015-10-06 NOTE — Progress Notes (Signed)
  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   Followed up with [name] , who fiilled out paperwork and submitted last week.  Patient to contact [place] with concerns.

## 2015-10-11 ENCOUNTER — Telehealth: Payer: Self-pay

## 2015-10-11 DIAGNOSIS — C50921 Malignant neoplasm of unspecified site of right male breast: Secondary | ICD-10-CM

## 2015-10-11 NOTE — Telephone Encounter (Signed)
Orders placed for Follow-up Mammogram and [place]. Call made to [facility].  Patient scheduled at [facility] on 11/02/15 at 1120am.  Follow-up with Dr. Azalee Course scheduled 11/03/15 at 1430 in the [facility].  Called and gave patient all appointment information and directions to office. Encouraged to call with any questions or concerns.

## 2015-11-01 ENCOUNTER — Encounter: Payer: Self-pay | Admitting: Radiation Oncology

## 2015-11-01 ENCOUNTER — Ambulatory Visit
Admission: RE | Admit: 2015-11-01 | Discharge: 2015-11-01 | Disposition: A | Discharge status: Home / Self Care | Payer: Medicaid Other | Source: Ambulatory Visit | Attending: Radiation Oncology | Admitting: Radiation Oncology

## 2015-11-01 VITALS — BP 139/63 | HR 68 | Temp 96.8°F | Resp 18 | Wt 332.9 lb

## 2015-11-01 DIAGNOSIS — M2550 Pain in unspecified joint: Secondary | ICD-10-CM | POA: Insufficient documentation

## 2015-11-01 DIAGNOSIS — C50911 Malignant neoplasm of unspecified site of right female breast: Secondary | ICD-10-CM | POA: Insufficient documentation

## 2015-11-01 DIAGNOSIS — Z170 Estrogen receptor positive status [ER+]: Secondary | ICD-10-CM | POA: Insufficient documentation

## 2015-11-01 DIAGNOSIS — L819 Disorder of pigmentation, unspecified: Secondary | ICD-10-CM | POA: Insufficient documentation

## 2015-11-01 DIAGNOSIS — Z79810 Long term (current) use of selective estrogen receptor modulators (SERMs): Secondary | ICD-10-CM | POA: Diagnosis not present

## 2015-11-01 NOTE — Progress Notes (Signed)
Radiation Oncology Follow up Note  Name: Michelle Mcmillan   Date:   11/01/2015 MRN:  [id number] DOB: Jul 03, 1970    This 45 y.o. female presents to the clinic today for one-month follow-up for invasive mammary carcinoma the right breast status post wide local excision and adjuvant whole breast radiation.  REFERRING PROVIDER: Donnie Coffin, MD  HPI: Patient is a 45 year old female now out 1 month having completed whole breast radiation to her right breast for a stage I invasive mammary carcinoma ER/PR positive HER-2/neu negative Oncotype DX showing low risk of recurrence. Seen today in routine follow-up she is doing well. She specifically denies breast tenderness cough or bone pain. She's been started on tamoxifen almost causing some exacerbation of joint pain and she will see the medical oncologists about that in the near future..  COMPLICATIONS OF TREATMENT: none  FOLLOW UP COMPLIANCE: keeps appointments   PHYSICAL EXAM:  BP 139/63 (BP Location: Left Arm, Patient Position: Sitting, Cuff Size: Large)   Pulse 68   Temp (!) 96.8 F (36 C)   Resp 18   Wt (!) 332 lb 14.3 oz (151 kg)   BMI 49.16 kg/m  Well-developed obese female in NAD.Lungs are clear to A&P cardiac examination essentially unremarkable with regular rate and rhythm. No dominant mass or nodularity is noted in either breast in 2 positions examined. Incision is well-healed. No axillary or supraclavicular adenopathy is appreciated. Cosmetic result is excellent. Well-developed well-nourished patient in NAD. HEENT reveals PERLA, EOMI, discs not visualized.  Oral cavity is clear. No oral mucosal lesions are identified. Neck is clear without evidence of cervical or supraclavicular adenopathy. Lungs are clear to A&P. Cardiac examination is essentially unremarkable with regular rate and rhythm without murmur rub or thrill. Abdomen is benign with no organomegaly or masses noted. Motor sensory and DTR levels are equal and symmetric in the upper  and lower extremities. Cranial nerves II through XII are grossly intact. Proprioception is intact. No peripheral adenopathy or edema is identified. No motor or sensory levels are noted. Crude visual fields are within normal range.  RADIOLOGY RESULTS: No current films for review  PLAN: At the present time 1 month out she is doing well. Still some slight hyperpigmentation the skin although no evidence of skin breakdown. I'm please were overall progress. She will discuss possibly switching her tamoxifen for another antiestrogen medication. I have asked to see her back in 4-5 months for follow-up. She knows to call sooner with any concerns.  I would like to take this opportunity to thank you for allowing me to participate in the care of your patient.Armstead Peaks., MD

## 2015-11-02 ENCOUNTER — Other Ambulatory Visit: Payer: Self-pay | Admitting: Surgery

## 2015-11-02 ENCOUNTER — Ambulatory Visit
Admission: RE | Admit: 2015-11-02 | Discharge: 2015-11-02 | Disposition: A | Discharge status: Home / Self Care | Payer: Medicaid Other | Source: Ambulatory Visit | Attending: Surgery | Admitting: Surgery

## 2015-11-02 DIAGNOSIS — C50921 Malignant neoplasm of unspecified site of right male breast: Secondary | ICD-10-CM

## 2015-11-02 DIAGNOSIS — Z9889 Other specified postprocedural states: Secondary | ICD-10-CM | POA: Insufficient documentation

## 2015-11-02 DIAGNOSIS — C50911 Malignant neoplasm of unspecified site of right female breast: Secondary | ICD-10-CM | POA: Diagnosis not present

## 2015-11-03 ENCOUNTER — Ambulatory Visit (INDEPENDENT_AMBULATORY_CARE_PROVIDER_SITE_OTHER): Payer: Medicaid Other | Admitting: Surgery

## 2015-11-03 ENCOUNTER — Encounter: Payer: Self-pay | Admitting: Surgery

## 2015-11-03 VITALS — BP 142/93 | HR 71 | Temp 98.2°F | Ht 69.0 in | Wt 334.0 lb

## 2015-11-03 DIAGNOSIS — C50911 Malignant neoplasm of unspecified site of right female breast: Secondary | ICD-10-CM

## 2015-11-03 NOTE — Patient Instructions (Addendum)
We will schedule your mammogram from [place] and after that we will see you. We will call you with these appointments.

## 2015-11-14 ENCOUNTER — Encounter: Payer: Self-pay | Admitting: Surgery

## 2015-11-14 NOTE — Progress Notes (Signed)
Subjective:     Patient ID: Michelle Mcmillan, female   DOB: Jul 25, 1970, 45 y.o.   MRN: [id number]  HPI  45 year old female who had right breast cancer ER/ PR+  with lumpectomy and sentinel lymph node biopsy performed in March with completion of her radiation treatment at the end of June.  Patient is on the tamoxifen which she is to take for 5 years. Patient has not noticed any new lumps or bumps in the breast she has done well except for some slight burn from the radiation.  Patient has her energy back at this time. Patient otherwise has no complaints.  Past Medical History:  Diagnosis Date  . Cancer [facility])    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)  . Headache    migraines  . Hypertension    Past Surgical History:  Procedure Laterality Date  . BREAST EXCISIONAL BIOPSY Right 06/15/15   breast cancer  . BREAST LUMPECTOMY WITH NEEDLE LOCALIZATION Right 06/15/2015   Procedure: BREAST LUMPECTOMY WITH NEEDLE LOCALIZATION;  Surgeon: Hubbard Robinson, MD;  Location: [facility] ORS;  Service: General;  Laterality: Right;  . CESAREAN SECTION  1988   x1  . SENTINEL NODE BIOPSY Right 06/15/2015   Procedure: SENTINEL NODE BIOPSY;  Surgeon: Hubbard Robinson, MD;  Location: [facility] ORS;  Service: General;  Laterality: Right;  . TOTAL MASTECTOMY Bilateral 06/15/2015   Procedure: TOTAL MASTECTOMY;  Surgeon: Hubbard Robinson, MD;  Location: [facility] ORS;  Service: General;  Laterality: Bilateral;  . TUBAL LIGATION  2004   Family History  Problem Relation Age of Onset  . Hypertension Mother   . Hypertension Father   . Hypertension Sister   . Breast cancer Neg Hx    Social History   Social History  . Marital status: Married    Spouse name: N/A  . Number of children: N/A  . Years of education: N/A   Social History Main Topics  . Smoking status: Never Smoker  . Smokeless tobacco: Never Used  . Alcohol use No  . Drug use: No  . Sexual activity: Not Asked   Other Topics Concern  . None   Social History Narrative  .  None    Current Outpatient Prescriptions:  .  aspirin-acetaminophen-caffeine (EXCEDRIN MIGRAINE) 250-250-65 MG tablet, Take by mouth as needed for headache., Disp: , Rfl:  .  calcium-vitamin D (OSCAL WITH D) 500-200 MG-UNIT tablet, Take 1 tablet by mouth 2 (two) times daily., Disp: , Rfl:  .  hydrochlorothiazide (HYDRODIURIL) 25 MG tablet, Take 25 mg by mouth daily., Disp: , Rfl:  .  ibuprofen (ADVIL,MOTRIN) 200 MG tablet, Take 200 mg by mouth as needed. Reported on 08/31/2015, Disp: , Rfl:  .  ibuprofen (ADVIL,MOTRIN) 800 MG tablet, Take 800 mg by mouth every 8 (eight) hours as needed., Disp: , Rfl:  .  oxyCODONE-acetaminophen (PERCOCET) 5-325 MG tablet, Take 1 tablet by mouth every 4 (four) hours as needed for severe pain. (Patient not taking: Reported on 11/03/2015), Disp: 20 tablet, Rfl: 0 .  silver sulfADIAZINE (SILVADENE) 1 % cream, Apply 1 application topically 2 (two) times daily. (Patient not taking: Reported on 11/01/2015), Disp: 50 g, Rfl: 2 .  tamoxifen (NOLVADEX) 20 MG tablet, Take 1 tablet (20 mg total) by mouth daily., Disp: 90 tablet, Rfl: 3 .  tiZANidine (ZANAFLEX) 2 MG tablet, Take 2 mg by mouth every 6 (six) hours as needed for muscle spasms., Disp: , Rfl:  No Known Allergies    Review of Systems  Constitutional: Positive for appetite  change and fatigue. Negative for activity change, chills and fever.  HENT: Negative for congestion and sore throat.   Respiratory: Negative for cough, shortness of breath and wheezing.   Cardiovascular: Negative for chest pain, palpitations and leg swelling.  Gastrointestinal: Negative for abdominal pain, diarrhea, nausea and vomiting.  Genitourinary: Negative for dysuria and hematuria.  Musculoskeletal: Negative for back pain and joint swelling.  Skin: Positive for rash. Negative for color change, pallor and wound.  Neurological: Negative for dizziness and weakness.  Hematological: Negative for adenopathy. Does not bruise/bleed easily.   Psychiatric/Behavioral: The patient is not nervous/anxious.        Vitals:   11/03/15 1428  BP: (!) 142/93  Pulse: 71  Temp: 98.2 F (36.8 C)    Objective:   Physical Exam  Constitutional: She is oriented to person, place, and time. She appears well-developed and well-nourished. No distress.  HENT:  Head: Normocephalic and atraumatic.  Right Ear: External ear normal.  Left Ear: External ear normal.  Nose: Nose normal.  Mouth/Throat: Oropharynx is clear and moist. No oropharyngeal exudate.  Eyes: Conjunctivae and EOM are normal. Pupils are equal, round, and reactive to light. No scleral icterus.  Neck: Normal range of motion. Neck supple. No tracheal deviation present.  Cardiovascular: Normal rate, regular rhythm, normal heart sounds and intact distal pulses.  Exam reveals no gallop and no friction rub.   No murmur heard. Pulmonary/Chest: Effort normal and breath sounds normal. No respiratory distress. She has no wheezes. She has no rales.  Right breast: well healed lateral lumpectomy/lymph node scar.  Some radiation hyperemia along the lateral portion of breast, no other skin changes or nipple retraction, no masses palpable in breast or lymphadenopathy  Left breast:  Normal skin, no changes, no masses palpable and no lymphadenopathy  Abdominal: Soft. Bowel sounds are normal. She exhibits no distension. There is no tenderness. There is no rebound.  Musculoskeletal: Normal range of motion. She exhibits no edema, tenderness or deformity.  Neurological: She is alert and oriented to person, place, and time. No cranial nerve deficit.  Skin: Skin is warm and dry. No rash noted. No erythema. No pallor.  Psychiatric: She has a normal mood and affect. Her behavior is normal. Judgment and thought content normal.  Vitals reviewed.      CBC Latest Ref Rng & Units 09/07/2015 08/24/2015 08/10/2015  WBC 3.6 - 11.0 K/uL 7.1 6.3 7.7  Hemoglobin 12.0 - 16.0 g/dL 12.2 11.9(L) 12.2  Hematocrit 35.0  - 47.0 % 37.2 36.8 37.1  Platelets 150 - 440 K/uL 214 219 241   CMP Latest Ref Rng & Units 07/27/2015 05/29/2015  Glucose 65 - 99 mg/dL 77 98  BUN 6 - 20 mg/dL 18 14  Creatinine 0.44 - 1.00 mg/dL 0.92 0.90  Sodium 135 - 145 mmol/L 138 140  Potassium 3.5 - 5.1 mmol/L 3.6 3.4(L)  Chloride 101 - 111 mmol/L 105 105  CO2 22 - 32 mmol/L 26 30  Calcium 8.9 - 10.3 mg/dL 8.4(L) 8.6(L)  Total Protein 6.5 - 8.1 g/dL 7.3 7.0  Total Bilirubin 0.3 - 1.2 mg/dL 0.4 0.4  Alkaline Phos 38 - 126 U/L 76 89  AST 15 - 41 U/L 18 23  ALT 14 - 54 U/L 12(L) 19   Mammogram: IMPRESSION: No mammographic evidence of malignancy, right breast. Expected post lumpectomy changes involving the upper outer quadrant of the right breast.  RECOMMENDATION: Annual bilateral diagnostic mammography in 6 months.  I have discussed the findings and recommendations with  the patient. Results were also provided in writing at the conclusion of the visit. If applicable, a reminder letter will be sent to the patient regarding the next appointment.   Assessment:     45yr old female with right breast cancer ER/PR positive s/p Lumpectomy and Rx treatment here for 6 month f/u    Plan:     I have personally reviewed the patient's records from the past 6 months. As well as her notes from both Dr. Donella Stade Dr. Grayland Ormond. Patient is on the tamoxifen and she is having some hot flashes and symptoms from that however otherwise is doing well. I personally reviewed her mammogram images which do show some scar changes of the previous lumpectomy site but no evidence of any suspicious calcifications or other masses. Her physical exam is also benign today and she is healing well. We'll have her return in 6 months with a bilateral mammogram at that time

## 2015-12-06 NOTE — Progress Notes (Signed)
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   INTERVAL HISTORY: Patient returns to clinic today for routine 3 month evaluation. Patient is complaining of increased lethargy and hair loss since initiating tamoxifen. She also complains of intermittent left leg swelling. She otherwise feels well today and is asymptomatic. She has no neurologic complaints. She denies any recent fevers or illnesses. She has a good appetite and denies weight loss. She denies any pain. She has no chest pain or shortness of breath. She denies any nausea, vomiting, constipation, or diarrhea. She has no urinary complaints. Patient offers no further specific complaints today.  REVIEW OF SYSTEMS:   Review of Systems  Constitutional: Positive for malaise/fatigue. Negative for fever and weight loss.  Respiratory: Negative.  Negative for cough and shortness of breath.   Cardiovascular: Negative.  Negative for chest pain.  Gastrointestinal: Negative.  Negative for abdominal pain.  Genitourinary: Negative.   Musculoskeletal: Negative.   Neurological: Negative.  Negative for weakness.  Psychiatric/Behavioral: Negative.     As per HPI. Otherwise, a complete review of systems is negatve.  PAST MEDICAL HISTORY: Past Medical History:  Diagnosis Date  . Cancer [facility])    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)  . Headache    migraines  . Hypertension   . Last menstrual period (LMP) > 10 days ago 08/2015    PAST SURGICAL HISTORY: Past Surgical History:  Procedure Laterality Date  . BREAST EXCISIONAL BIOPSY Right 06/15/15   breast cancer  . BREAST  LUMPECTOMY WITH NEEDLE LOCALIZATION Right 06/15/2015   Procedure: BREAST LUMPECTOMY WITH NEEDLE LOCALIZATION;  Surgeon: Hubbard Robinson, MD;  Location: [facility] ORS;  Service: General;  Laterality: Right;  . CESAREAN SECTION  1988   x1  . SENTINEL NODE BIOPSY Right 06/15/2015   Procedure: SENTINEL NODE BIOPSY;  Surgeon: Hubbard Robinson, MD;  Location: [facility] ORS;  Service: General;  Laterality: Right;  . TOTAL MASTECTOMY Bilateral 06/15/2015   Procedure: TOTAL MASTECTOMY;  Surgeon: Hubbard Robinson, MD;  Location: [facility] ORS;  Service: General;  Laterality: Bilateral;  . TUBAL LIGATION  2004    FAMILY HISTORY Family History  Problem Relation Age of Onset  . Hypertension Mother   . Hypertension Father   . Hypertension Sister   . Breast cancer Neg Hx        ADVANCED DIRECTIVES:    HEALTH MAINTENANCE: Social History  Substance Use Topics  . Smoking status: Never Smoker  . Smokeless tobacco: Never Used  . Alcohol use No    No Known Allergies  Current Outpatient Prescriptions  Medication Sig Dispense Refill  . aspirin-acetaminophen-caffeine (EXCEDRIN MIGRAINE) 250-250-65 MG tablet Take by mouth as needed for headache.    . calcium-vitamin D (OSCAL WITH D) 500-200 MG-UNIT tablet Take 1 tablet by mouth 2 (two) times daily.    . hydrochlorothiazide (HYDRODIURIL) 25 MG tablet Take 25 mg by mouth daily.    Marland Kitchen ibuprofen (ADVIL,MOTRIN) 200 MG tablet Take 200 mg by mouth as needed. Reported on 08/31/2015    . ibuprofen (ADVIL,MOTRIN) 800 MG tablet Take 800 mg by mouth every 8 (eight) hours as needed.    Marland Kitchen oxyCODONE-acetaminophen (PERCOCET) 5-325 MG  tablet Take 1 tablet by mouth every 4 (four) hours as needed for severe pain. 20 tablet 0  . tiZANidine (ZANAFLEX) 2 MG tablet Take 2 mg by mouth every 6 (six) hours as needed for muscle spasms.    Marland Kitchen exemestane (AROMASIN) 25 MG tablet Take 1 tablet (25 mg total) by mouth daily after breakfast. 30 tablet 6   No current facility-administered  medications for this visit.     OBJECTIVE: Vitals:   12/07/15 1021  BP: 136/89  Pulse: 71  Resp: 18  Temp: 98.1 F (36.7 C)     Body mass index is 49.71 kg/m.    ECOG FS:0 - Asymptomatic  General: Well-developed, well-nourished, no acute distress. Eyes: Pink conjunctiva, anicteric sclera. Breasts: Patient requested exam be deferred today. Lungs: Clear to auscultation bilaterally. Heart: Regular rate and rhythm. No rubs, murmurs, or gallops. Abdomen: Soft, nontender, nondistended. No organomegaly noted, normoactive bowel sounds. Musculoskeletal: No edema, cyanosis, or clubbing. Neuro: Alert, answering all questions appropriately. Cranial nerves grossly intact. Skin: No rashes or petechiae noted. Psych: Normal affect.   LAB RESULTS:  Lab Results  Component Value Date   NA 138 07/27/2015   K 3.6 07/27/2015   CL 105 07/27/2015   CO2 26 07/27/2015   GLUCOSE 77 07/27/2015   BUN 18 07/27/2015   CREATININE 0.92 07/27/2015   CALCIUM 8.4 (L) 07/27/2015   PROT 7.3 07/27/2015   ALBUMIN 3.6 07/27/2015   AST 18 07/27/2015   ALT 12 (L) 07/27/2015   ALKPHOS 76 07/27/2015   BILITOT 0.4 07/27/2015   GFRNONAA >60 07/27/2015   GFRAA >60 07/27/2015    Lab Results  Component Value Date   WBC 7.1 09/07/2015   NEUTROABS 5.4 07/27/2015   HGB 12.2 09/07/2015   HCT 37.2 09/07/2015   MCV 80.2 09/07/2015   PLT 214 09/07/2015     STUDIES: US Venous Img Lower Unilateral Left  Result Date: 12/07/2015 CLINICAL DATA:  45 year old female with left lower extremity pain and swelling EXAM: LEFT LOWER EXTREMITY VENOUS DOPPLER ULTRASOUND TECHNIQUE: Gray-scale sonography with graded compression, as well as color Doppler and duplex ultrasound were performed to evaluate the lower extremity deep venous systems from the level of the common femoral vein and including the common femoral, femoral, profunda femoral, popliteal and calf veins including the posterior tibial, peroneal and gastrocnemius veins  when visible. The superficial great saphenous vein was also interrogated. Spectral Doppler was utilized to evaluate flow at rest and with distal augmentation maneuvers in the common femoral, femoral and popliteal veins. COMPARISON:  None. FINDINGS: Contralateral Common Femoral Vein: Respiratory phasicity is normal and symmetric with the symptomatic side. No evidence of thrombus. Normal compressibility. Common Femoral Vein: No evidence of thrombus. Normal compressibility, respiratory phasicity and response to augmentation. Saphenofemoral Junction: No evidence of thrombus. Normal compressibility and flow on color Doppler imaging. Profunda Femoral Vein: No evidence of thrombus. Normal compressibility and flow on color Doppler imaging. Femoral Vein: No evidence of thrombus. Normal compressibility, respiratory phasicity and response to augmentation. Popliteal Vein: No evidence of thrombus. Normal compressibility, respiratory phasicity and response to augmentation. Calf Veins: No evidence of thrombus. Normal compressibility and flow on color Doppler imaging. Superficial Great Saphenous Vein: No evidence of thrombus. Normal compressibility and flow on color Doppler imaging. Venous Reflux:  None. Other Findings:  None. IMPRESSION: No evidence of deep venous thrombosis. Electronically Signed   By: Jacqulynn Cadet M.D.   On: 12/07/2015 14:25    ASSESSMENT:  Pathologic stage Ia ER/PR positive, HER-2  negative adenocarcinoma of the upper outer quadrant of the right breast. BRCA 1 and 2 negative, Oncotype DX score 17 which is considered low risk.  PLAN:    1. 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 Patient was also found to be low risk on her Oncotype DX score therefore chemotherapy was not necessary. Patient completed adjuvant XRT on September 19, 2015. Because of her possible side effects of hair loss  and lethargy with tamoxifen, patient was switched to Aromasin. By patient report, it appears she is perimenopausal. Continue Aromasin for 5 years finishing in June 2022.  Patient's most recent mammogram on November 02, 2015 was reported as BI-RADS 2, repeat in one year. Patient will also require bone mineral density in the near future.  Return to clinic in 3 months for further evaluation. 2. Genetic testing: Patient was found to be BRCA 1 and 2 negative. She expressed understanding that although her genetic testing was negative, her children are at higher risk for breast cancer than the general population.  3. Left leg swelling: Ultrasound reviewed independently and negative for DVT. 4. Hair loss: Switched to Aromasin as above.   Patient expressed understanding and was in agreement with this plan. She also understands that She can call clinic at any time with any questions, concerns, or complaints.    Lloyd Huger, MD   12/10/2015 10:36 AM

## 2015-12-07 ENCOUNTER — Encounter (INDEPENDENT_AMBULATORY_CARE_PROVIDER_SITE_OTHER): Payer: Self-pay

## 2015-12-07 ENCOUNTER — Inpatient Hospital Stay: Payer: Medicaid Other | Attending: Oncology | Admitting: Oncology

## 2015-12-07 ENCOUNTER — Telehealth: Payer: Self-pay | Admitting: *Deleted

## 2015-12-07 ENCOUNTER — Ambulatory Visit
Admission: RE | Admit: 2015-12-07 | Discharge: 2015-12-07 | Disposition: A | Discharge status: Home / Self Care | Payer: Medicaid Other | Source: Ambulatory Visit | Attending: Oncology | Admitting: Oncology

## 2015-12-07 ENCOUNTER — Encounter: Payer: Self-pay | Admitting: Oncology

## 2015-12-07 VITALS — BP 136/89 | HR 71 | Temp 98.1°F | Resp 18 | Wt 336.6 lb

## 2015-12-07 DIAGNOSIS — C50411 Malignant neoplasm of upper-outer quadrant of right female breast: Secondary | ICD-10-CM | POA: Diagnosis not present

## 2015-12-07 DIAGNOSIS — R600 Localized edema: Secondary | ICD-10-CM

## 2015-12-07 DIAGNOSIS — M7989 Other specified soft tissue disorders: Secondary | ICD-10-CM | POA: Insufficient documentation

## 2015-12-07 DIAGNOSIS — R531 Weakness: Secondary | ICD-10-CM | POA: Insufficient documentation

## 2015-12-07 DIAGNOSIS — R5383 Other fatigue: Secondary | ICD-10-CM | POA: Diagnosis not present

## 2015-12-07 DIAGNOSIS — Z9013 Acquired absence of bilateral breasts and nipples: Secondary | ICD-10-CM | POA: Diagnosis not present

## 2015-12-07 DIAGNOSIS — Z79811 Long term (current) use of aromatase inhibitors: Secondary | ICD-10-CM | POA: Diagnosis not present

## 2015-12-07 DIAGNOSIS — M79606 Pain in leg, unspecified: Secondary | ICD-10-CM | POA: Insufficient documentation

## 2015-12-07 DIAGNOSIS — Z923 Personal history of irradiation: Secondary | ICD-10-CM | POA: Insufficient documentation

## 2015-12-07 DIAGNOSIS — Z170 Estrogen receptor positive status [ER+]: Secondary | ICD-10-CM | POA: Diagnosis not present

## 2015-12-07 DIAGNOSIS — R5381 Other malaise: Secondary | ICD-10-CM | POA: Diagnosis not present

## 2015-12-07 DIAGNOSIS — Z7982 Long term (current) use of aspirin: Secondary | ICD-10-CM | POA: Diagnosis not present

## 2015-12-07 DIAGNOSIS — Z79899 Other long term (current) drug therapy: Secondary | ICD-10-CM | POA: Insufficient documentation

## 2015-12-07 DIAGNOSIS — L658 Other specified nonscarring hair loss: Secondary | ICD-10-CM | POA: Insufficient documentation

## 2015-12-07 DIAGNOSIS — I10 Essential (primary) hypertension: Secondary | ICD-10-CM | POA: Insufficient documentation

## 2015-12-07 MED ORDER — EXEMESTANE 25 MG PO TABS: 25.0000 mg | ORAL_TABLET | Freq: Every day | ORAL | 6 refills | Status: DC

## 2015-12-07 NOTE — Telephone Encounter (Signed)
Pharmacy does not have med  That was ordered.It got printed so I reordered and made sure it e scribed

## 2015-12-07 NOTE — Progress Notes (Signed)
States since started tamoxifen has developed left lower leg bone pain, blurred vision, and hair loss.

## 2015-12-12 NOTE — Progress Notes (Signed)
  Oncology Nurse Navigator Documentation  Navigator Location: [facility] (12/12/15 1500) Navigator Encounter Type: Follow-up Appt (12/12/15 1500)           Patient Visit Type: Follow-up (12/12/15 1500) Treatment Phase: Active Tx (12/12/15 1500)                            Time Spent with Patient: 30 (12/12/15 1500)  Patient has questions about antihormonal medication.  Verified with Dr. Grayland Ormond that she is switching to Aromasin to see if side effects improve.

## 2015-12-28 ENCOUNTER — Telehealth: Payer: Self-pay | Admitting: *Deleted

## 2015-12-28 NOTE — Telephone Encounter (Signed)
Inquiring if she can get flu shot being on the new drug (Aromasin), they are giving them at her work

## 2015-12-28 NOTE — Telephone Encounter (Signed)
Per Dr Grayland Ormond, [place] to get Flu shot, patient infomred

## 2016-01-18 ENCOUNTER — Telehealth: Payer: Self-pay | Admitting: *Deleted

## 2016-01-18 NOTE — Telephone Encounter (Signed)
Asking if she can get PPD with all the meds she is on (answering service message)

## 2016-01-18 NOTE — Telephone Encounter (Signed)
Patient informed per Dr Grayland Ormond ok to get PPD. Left message on VM

## 2016-01-18 NOTE — Telephone Encounter (Signed)
The only thing we have her on is Aromasin and that should be fine.

## 2016-03-06 NOTE — Progress Notes (Signed)
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   INTERVAL HISTORY: Patient returns to clinic today for routine 3 month evaluation. Patient is feeling much better since she switched from Tamoxifen to Aromasin. She has less hair loss, less fatigue and less leg swelling although they are all still occurring. She has started taking a prenatal vitamin which is helping with hair loss. She does complain of mild bone pain that is relieved with NSAIDS. She otherwise feels well today and is asymptomatic. She has no neurologic complaints. She denies any recent fevers or illnesses. She has a good appetite and denies weight loss. She denies any pain. She has no chest pain or shortness of breath. She denies any nausea, vomiting, constipation, or diarrhea. She has no urinary complaints. Patient offers no further specific complaints today.  REVIEW OF SYSTEMS:   Review of Systems  Constitutional: Positive for malaise/fatigue. Negative for fever and weight loss.  Respiratory: Negative.  Negative for cough and shortness of breath.   Cardiovascular: Negative.  Negative for chest pain.  Gastrointestinal: Negative.  Negative for abdominal pain.  Genitourinary: Negative.   Musculoskeletal: Positive for myalgias.  Neurological: Negative.  Negative for weakness.  Psychiatric/Behavioral: Negative.     As per HPI. Otherwise, a complete review of systems is negative.  PAST MEDICAL HISTORY: Past Medical History:  Diagnosis Date  . Cancer [facility])    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)  . Headache    migraines  . Hypertension   . Last menstrual  period (LMP) > 10 days ago 08/2015    PAST SURGICAL HISTORY: Past Surgical History:  Procedure Laterality Date  . BREAST EXCISIONAL BIOPSY Right 06/15/15   breast cancer  . BREAST LUMPECTOMY WITH NEEDLE LOCALIZATION Right 06/15/2015   Procedure: BREAST LUMPECTOMY WITH NEEDLE LOCALIZATION;  Surgeon: Hubbard Robinson, MD;  Location: [facility] ORS;  Service: General;  Laterality: Right;  . CESAREAN SECTION  1988   x1  . SENTINEL NODE BIOPSY Right 06/15/2015   Procedure: SENTINEL NODE BIOPSY;  Surgeon: Hubbard Robinson, MD;  Location: [facility] ORS;  Service: General;  Laterality: Right;  . TOTAL MASTECTOMY Bilateral 06/15/2015   Procedure: TOTAL MASTECTOMY;  Surgeon: Hubbard Robinson, MD;  Location: [facility] ORS;  Service: General;  Laterality: Bilateral;  . TUBAL LIGATION  2004    FAMILY HISTORY Family History  Problem Relation Age of Onset  . Hypertension Mother   . Hypertension Father   . Hypertension Sister   . Breast cancer Neg Hx        ADVANCED DIRECTIVES:    HEALTH MAINTENANCE: Social History  Substance Use Topics  . Smoking status: Never Smoker  . Smokeless tobacco: Never Used  . Alcohol use No    No Known Allergies  Current Outpatient Prescriptions  Medication Sig Dispense Refill  . aspirin-acetaminophen-caffeine (EXCEDRIN MIGRAINE) 250-250-65 MG tablet Take by mouth as needed for headache.    . calcium-vitamin D (OSCAL WITH D) 500-200 MG-UNIT tablet Take 1 tablet by mouth 2 (two) times daily.    Marland Kitchen exemestane (AROMASIN) 25 MG tablet Take 1 tablet (25 mg total) by mouth daily after breakfast. 30 tablet 6  . hydrochlorothiazide (HYDRODIURIL)  25 MG tablet Take 25 mg by mouth daily.    Marland Kitchen ibuprofen (ADVIL,MOTRIN) 800 MG tablet Take 800 mg by mouth every 8 (eight) hours as needed.    Marland Kitchen oxyCODONE-acetaminophen (PERCOCET) 5-325 MG tablet Take 1 tablet by mouth every 4 (four) hours as needed for severe pain. 20 tablet 0  . Prenatal Vit-Fe Fumarate-FA (PRENATAL VITAMIN PO) Take 1  tablet by mouth daily.    Marland Kitchen tiZANidine (ZANAFLEX) 2 MG tablet Take 2 mg by mouth every 6 (six) hours as needed for muscle spasms.     No current facility-administered medications for this visit.     OBJECTIVE: Vitals:   03/07/16 0955  BP: (!) 171/93  Pulse: 76  Resp: 18  Temp: 97 F (36.1 C)     Body mass index is 51.5 kg/m.    ECOG FS:0 - Asymptomatic  General: Well-developed, well-nourished, no acute distress. Eyes: Pink conjunctiva, anicteric sclera. Breasts: Patient requested exam be deferred today. Lungs: Clear to auscultation bilaterally. Heart: Regular rate and rhythm. No rubs, murmurs, or gallops. Abdomen: Soft, nontender, nondistended. No organomegaly noted, normoactive bowel sounds. Musculoskeletal: No edema, cyanosis, or clubbing. Neuro: Alert, answering all questions appropriately. Cranial nerves grossly intact. Skin: No rashes or petechiae noted. Psych: Normal affect.   LAB RESULTS:  Lab Results  Component Value Date   NA 138 07/27/2015   K 3.6 07/27/2015   CL 105 07/27/2015   CO2 26 07/27/2015   GLUCOSE 77 07/27/2015   BUN 18 07/27/2015   CREATININE 0.92 07/27/2015   CALCIUM 8.4 (L) 07/27/2015   PROT 7.3 07/27/2015   ALBUMIN 3.6 07/27/2015   AST 18 07/27/2015   ALT 12 (L) 07/27/2015   ALKPHOS 76 07/27/2015   BILITOT 0.4 07/27/2015   GFRNONAA >60 07/27/2015   GFRAA >60 07/27/2015    Lab Results  Component Value Date   WBC 7.1 09/07/2015   NEUTROABS 5.4 07/27/2015   HGB 12.2 09/07/2015   HCT 37.2 09/07/2015   MCV 80.2 09/07/2015   PLT 214 09/07/2015     STUDIES: No results found.  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 BRCA 1 and 2 negative, Oncotype DX score 17 which is considered low risk.  PLAN:    1. 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 Grayland Ormond. Patient was also found to be low risk on her Oncotype DX score therefore chemotherapy was not necessary. Patient completed adjuvant XRT on September 19, 2015. Because of her possible side effects of hair loss and lethargy with tamoxifen, patient was switched to Aromasin. Per patient she is perimenopausal. Continue Aromasin for 5 years finishing in June 2022.  Patient's most recent mammogram on November 02, 2015 was reported as BI-RADS 2, repeat in one year. Dexa scan ordered for next 6 months. Return to clinic in 3 months for further evaluation. 2. Genetic testing: Patient was found to be BRCA 1 and 2 negative. She expressed understanding that although her genetic testing was negative, her children are at higher risk for breast cancer than the general population.  3. Left leg swelling: Still having minimal bilateral leg swelling. Better since she switched to Aromasin. Monitor. 4. Hair loss: Better since she has switched to Aromasin and starting taking a pre-natal. Monitor. 5: Pain: Continue OTC NSAIDS as needed for pain. Monitor.  Patient expressed understanding and was in  agreement with this plan. She also understands that She can call clinic at any time with any questions, concerns, or complaints.   Faythe Casa, NP 03/07/2016 12:23 PM  Patient was seen and evaluated independently and I agree with the assessment and plan as dictated above.  Lloyd Huger, MD 03/09/16 9:02 AM

## 2016-03-07 ENCOUNTER — Encounter (INDEPENDENT_AMBULATORY_CARE_PROVIDER_SITE_OTHER): Payer: Self-pay

## 2016-03-07 ENCOUNTER — Inpatient Hospital Stay: Payer: Medicaid Other | Attending: Oncology | Admitting: Oncology

## 2016-03-07 VITALS — BP 171/93 | HR 76 | Temp 97.0°F | Resp 18 | Wt 348.8 lb

## 2016-03-07 DIAGNOSIS — L659 Nonscarring hair loss, unspecified: Secondary | ICD-10-CM | POA: Diagnosis not present

## 2016-03-07 DIAGNOSIS — Z7982 Long term (current) use of aspirin: Secondary | ICD-10-CM

## 2016-03-07 DIAGNOSIS — C50411 Malignant neoplasm of upper-outer quadrant of right female breast: Secondary | ICD-10-CM | POA: Insufficient documentation

## 2016-03-07 DIAGNOSIS — Z923 Personal history of irradiation: Secondary | ICD-10-CM | POA: Insufficient documentation

## 2016-03-07 DIAGNOSIS — Z79899 Other long term (current) drug therapy: Secondary | ICD-10-CM | POA: Diagnosis not present

## 2016-03-07 DIAGNOSIS — R5381 Other malaise: Secondary | ICD-10-CM

## 2016-03-07 DIAGNOSIS — Z79811 Long term (current) use of aromatase inhibitors: Secondary | ICD-10-CM

## 2016-03-07 DIAGNOSIS — Z170 Estrogen receptor positive status [ER+]: Secondary | ICD-10-CM | POA: Diagnosis not present

## 2016-03-07 DIAGNOSIS — R5383 Other fatigue: Secondary | ICD-10-CM | POA: Insufficient documentation

## 2016-03-07 DIAGNOSIS — R600 Localized edema: Secondary | ICD-10-CM

## 2016-03-07 DIAGNOSIS — M791 Myalgia: Secondary | ICD-10-CM | POA: Diagnosis not present

## 2016-03-07 DIAGNOSIS — Z9013 Acquired absence of bilateral breasts and nipples: Secondary | ICD-10-CM

## 2016-03-07 DIAGNOSIS — I10 Essential (primary) hypertension: Secondary | ICD-10-CM | POA: Diagnosis not present

## 2016-03-07 NOTE — Progress Notes (Signed)
Occasional nausea. Bone pain from exemestane but is much improved after stopping tamoxifen.

## 2016-03-28 ENCOUNTER — Telehealth: Payer: Self-pay

## 2016-03-28 DIAGNOSIS — C50911 Malignant neoplasm of unspecified site of right female breast: Secondary | ICD-10-CM

## 2016-03-28 NOTE — Telephone Encounter (Signed)
Pt is calling today stating she is due for her mammogram. She saw Dr. Azalee Course on 11/03/15 and at that visit she wanted her to have a bilateral mammogram in 6 months and follow up with her after this. Please contact pt with appt.

## 2016-03-28 NOTE — Telephone Encounter (Signed)
Called patient back to let her know that I would schedule her mammogram and follow up appointment with Dr. Azalee Course and then I would call her back. Patient understood and had no further questions.

## 2016-03-28 NOTE — Telephone Encounter (Signed)
Called patient back to let her know that she will have her mammogram and U/S on 05/03/2016 at [facility] and follow up appointment to go over her results on 05/07/2016 with Dr. Azalee Course. Patient agreed and had no further questions.

## 2016-04-29 ENCOUNTER — Ambulatory Visit
Admission: RE | Admit: 2016-04-29 | Discharge: 2016-04-29 | Disposition: A | Discharge status: Home / Self Care | Payer: Medicaid Other | Source: Ambulatory Visit | Attending: Radiation Oncology | Admitting: Radiation Oncology

## 2016-04-29 ENCOUNTER — Encounter: Payer: Self-pay | Admitting: Radiation Oncology

## 2016-04-29 ENCOUNTER — Encounter (INDEPENDENT_AMBULATORY_CARE_PROVIDER_SITE_OTHER): Payer: Self-pay

## 2016-04-29 VITALS — BP 128/80 | HR 90 | Temp 96.0°F | Wt 350.1 lb

## 2016-04-29 DIAGNOSIS — C50411 Malignant neoplasm of upper-outer quadrant of right female breast: Secondary | ICD-10-CM

## 2016-04-29 DIAGNOSIS — Z853 Personal history of malignant neoplasm of breast: Secondary | ICD-10-CM | POA: Insufficient documentation

## 2016-04-29 DIAGNOSIS — Z923 Personal history of irradiation: Secondary | ICD-10-CM | POA: Insufficient documentation

## 2016-04-29 NOTE — Progress Notes (Signed)
.  Radiation Oncology Follow up Note  Name: Michelle Mcmillan   Date:   04/29/2016 MRN:  [id number] DOB: 1971-03-21    This 46 y.o. female presents to the clinic today for 7 month follow-up status post whole breast radiation to her right breast for stage I invasive mammary carcinoma ER/PR positive.  REFERRING PROVIDER: Donnie Coffin, MD  HPI: Patient is a 46 year old female now out 7 months having completed radiation therapy to her right breast for stage I invasive mammary carcinoma ER/PR positive HER-2/neu negative. Oncotype DX showed a low risk of recurrence. She is seen today in routine follow-up is doing well. She specifically denies breast tenderness cough or bone pain..  COMPLICATIONS OF TREATMENT: none  FOLLOW UP COMPLIANCE: keeps appointments   PHYSICAL EXAM:  BP 128/80   Pulse 90   Temp (!) 96 F (35.6 C)   Wt (!) 350 lb 1.5 oz (158.8 kg)   BMI 51.70 kg/m  Lungs are clear to A&P cardiac examination essentially unremarkable with regular rate and rhythm. No dominant mass or nodularity is noted in either breast in 2 positions examined. Incision is well-healed. No axillary or supraclavicular adenopathy is appreciated. Cosmetic result is excellent. Well-developed well-nourished patient in NAD. HEENT reveals PERLA, EOMI, discs not visualized.  Oral cavity is clear. No oral mucosal lesions are identified. Neck is clear without evidence of cervical or supraclavicular adenopathy. Lungs are clear to A&P. Cardiac examination is essentially unremarkable with regular rate and rhythm without murmur rub or thrill. Abdomen is benign with no organomegaly or masses noted. Motor sensory and DTR levels are equal and mmetric in the upper and lower extremities. Cranial nerves II through XII are grossly intact. Proprioception is intact. No peripheral adenopathy or edema is identified. No motor or sensory levels are noted. Crude visual fields are within normal range.  RADIOLOGY RESULTS: Last mammograms are  reviewed and more BI-RADS 2 she scheduled for repeat mammograms next week  PLAN: Present time she is doing well with no evidence of disease. I'm please were overall progress. I've asked to see her back in 6 months for follow-up. Patient knows to call with any concerns. Will check at her mammograms when they're performed next week.  I would like to take this opportunity to thank you for allowing me to participate in the care of your patient.Armstead Peaks., MD

## 2016-05-03 ENCOUNTER — Other Ambulatory Visit: Payer: Self-pay | Admitting: Surgery

## 2016-05-03 ENCOUNTER — Other Ambulatory Visit: Payer: Medicaid Other

## 2016-05-03 ENCOUNTER — Ambulatory Visit
Admission: RE | Admit: 2016-05-03 | Discharge: 2016-05-03 | Disposition: A | Discharge status: Home / Self Care | Payer: Medicaid Other | Source: Ambulatory Visit | Attending: Surgery | Admitting: Surgery

## 2016-05-03 DIAGNOSIS — C50911 Malignant neoplasm of unspecified site of right female breast: Secondary | ICD-10-CM | POA: Diagnosis not present

## 2016-05-03 HISTORY — DX: Malignant neoplasm of unspecified site of unspecified female breast: C50.919

## 2016-05-07 ENCOUNTER — Ambulatory Visit: Payer: Self-pay | Admitting: Surgery

## 2016-05-08 ENCOUNTER — Encounter: Payer: Self-pay | Admitting: Surgery

## 2016-05-08 ENCOUNTER — Ambulatory Visit (INDEPENDENT_AMBULATORY_CARE_PROVIDER_SITE_OTHER): Payer: Medicaid Other | Admitting: Surgery

## 2016-05-08 VITALS — BP 145/88 | HR 82 | Temp 98.0°F | Ht 69.0 in | Wt 349.0 lb

## 2016-05-08 DIAGNOSIS — C50911 Malignant neoplasm of unspecified site of right female breast: Secondary | ICD-10-CM

## 2016-05-08 DIAGNOSIS — R222 Localized swelling, mass and lump, trunk: Secondary | ICD-10-CM | POA: Diagnosis not present

## 2016-05-08 NOTE — Progress Notes (Signed)
Outpatient Surgical Follow Up  05/08/2016  Michelle Mcmillan is an 46 y.o. female.   CC: Right breast cancer  HPI: This patient with right breast cancer treated by breast conservation therapy who is currently not on tamoxifen. Her surgery was in March 2017 by Dr. Azalee Course. Patient is here for follow-up and has a mammogram showing BI-RADS 2 which will be personally reviewed. No complaints at this time here for routine follow-up 1 year after surgery.  Patient has a noninflamed sebaceous cyst of the left inframammary fold which she would like to have removed at some point. Past Medical History:  Diagnosis Date  . Breast cancer ([place]) 2017   right breast  . Cancer ([place])    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)  . Headache    migraines  . Hypertension   . Last menstrual period (LMP) > 10 days ago 08/2015    Past Surgical History:  Procedure Laterality Date  . BREAST EXCISIONAL BIOPSY Right 06/15/15   breast cancer  . BREAST LUMPECTOMY WITH NEEDLE LOCALIZATION Right 06/15/2015   Procedure: BREAST LUMPECTOMY WITH NEEDLE LOCALIZATION;  Surgeon: Hubbard Robinson, MD;  Location: [facility] ORS;  Service: General;  Laterality: Right;  . CESAREAN SECTION  1988   x1  . SENTINEL NODE BIOPSY Right 06/15/2015   Procedure: SENTINEL NODE BIOPSY;  Surgeon: Hubbard Robinson, MD;  Location: [facility] ORS;  Service: General;  Laterality: Right;  . TOTAL MASTECTOMY Bilateral 06/15/2015   Procedure: TOTAL MASTECTOMY;  Surgeon: Hubbard Robinson, MD;  Location: [facility] ORS;  Service: General;  Laterality: Bilateral;  . TUBAL LIGATION  2004    Family History  Problem Relation Age of Onset  . Hypertension Mother   . Hypertension Father   . Hypertension Sister   . Breast cancer Neg Hx     Social History:  reports that she has never smoked. She has never used smokeless tobacco. She reports that she does not drink alcohol or use drugs.  Allergies: No Known Allergies  Medications reviewed.   Review of Systems:   Review of  Systems  Constitutional: Negative.   HENT: Negative.   Eyes: Negative.   Respiratory: Negative.   Cardiovascular: Negative.   Gastrointestinal: Negative.   Genitourinary: Negative.   Musculoskeletal: Negative.   Skin: Negative.   Neurological: Negative.   Endo/Heme/Allergies: Negative.   Psychiatric/Behavioral: Negative.      Physical Exam:  There were no vitals taken for this visit.  Physical Exam  Constitutional: She is oriented to person, place, and time and well-developed, well-nourished, and in no distress. No distress.  HENT:  Head: Normocephalic and atraumatic.  Eyes: Pupils are equal, round, and reactive to light. Right eye exhibits no discharge. Left eye exhibits no discharge. No scleral icterus.  Neck: Normal range of motion.  Cardiovascular: Normal rate, regular rhythm and normal heart sounds.   Pulmonary/Chest: Effort normal. No respiratory distress.  Abdominal: Soft. There is no tenderness.  Musculoskeletal: Normal range of motion. She exhibits no edema or tenderness.  Lymphadenopathy:    She has no cervical adenopathy.  Neurological: She is alert and oriented to person, place, and time.  Skin: Skin is warm and dry. No rash noted. She is not diaphoretic. No erythema.  Psychiatric: Mood and affect normal.  Vitals reviewed.  Breast exam: No mass in either breast no axillary adenopathy right-sided scars well healed no erythema.  In the inframammary fold is a 2 cm sebaceous cyst which is noninflamed this is on the left side.   No results  found for this or any previous visit (from the past 48 hour(s)). No results found.  Assessment/Plan:  Patient status post right breast conservation therapy with radiation. She has no complaints at this time her mammogram is read as a BI-RADS 2 and I have personally reviewed those films and am in agreement with that. I recommend that she follow up in 6 months for a physical exam only.  The patient has a 2 cm sebaceous cyst of  the inframammary fold which she would like to have removed. Because of her history of breast cancer like to do this in the operating room under monitored anesthesia care. We'll discuss the options of observation risk bleeding infection recurrence cosmetic deformity.  Florene Glen, MD, FACS

## 2016-05-08 NOTE — Patient Instructions (Signed)
We will arrange for your cyst under your Left Breast to be removed at [facility] by Dr. Burt Knack on 05/20/16. Please see (blue)information sheet given.  In regards to your Breast Follow-up, Dr. Burt Knack will see you back in our office in 6 months and no imaging will be needed at that time.

## 2016-05-09 ENCOUNTER — Telehealth: Payer: Self-pay

## 2016-05-09 NOTE — Telephone Encounter (Signed)
Call made to patient to advise of the Surgery Information below. No answer. Left voicemail for return phone call.    Surgery Date: 05/20/16  Pre-admit Appointment: 05/13/16 from 0900-1300  Patient has been advised to call [phone] the day before surgery between 1-3pm to obtain arrival time.  No surgical deposit necessary.

## 2016-05-10 NOTE — Telephone Encounter (Signed)
Called patient back. She has been advised of Surgical Information below.

## 2016-05-10 NOTE — Telephone Encounter (Signed)
Insurance verified through [employer]- No authorization required for CPT [phone] with Medicaid.

## 2016-05-13 ENCOUNTER — Encounter: Payer: Self-pay | Admitting: *Deleted

## 2016-05-13 ENCOUNTER — Encounter
Admission: RE | Admit: 2016-05-13 | Discharge: 2016-05-13 | Disposition: A | Discharge status: Home / Self Care | Payer: Medicaid Other | Source: Ambulatory Visit | Attending: Surgery | Admitting: Surgery

## 2016-05-13 HISTORY — DX: Unspecified osteoarthritis, unspecified site: M19.90

## 2016-05-13 HISTORY — DX: Gastro-esophageal reflux disease without esophagitis: K21.9

## 2016-05-13 HISTORY — DX: Anemia, unspecified: D64.9

## 2016-05-13 NOTE — Patient Instructions (Signed)
  Your procedure is scheduled on: 05-20-16 [facility]) Report to Same Day Surgery 2nd floor medical mall [facility] Entrance-take elevator on left to 2nd floor.  Check in with surgery information desk.) To find out your arrival time please call [phone] between 1PM - 3PM on 05-17-16 (FRIDAY)  Remember: Instructions that are not followed completely may result in serious medical risk, up to and including death, or upon the discretion of your surgeon and anesthesiologist your surgery may need to be rescheduled.    _x___ 1. Do not eat food or drink liquids after midnight. No gum chewing or hard candies.     __x__ 2. No Alcohol for 24 hours before or after surgery.   __x__3. No Smoking for 24 prior to surgery.   ____  4. Bring all medications with you on the day of surgery if instructed.    __x__ 5. Notify your doctor if there is any change in your medical condition     (cold, fever, infections).     Do not wear jewelry, make-up, hairpins, clips or nail polish.  Do not wear lotions, powders, or perfumes. You may wear deodorant.  Do not shave 48 hours prior to surgery. Men may shave face and neck.  Do not bring valuables to the hospital.    [facility] is not responsible for any belongings or valuables.               Contacts, dentures or bridgework may not be worn into surgery.  Leave your suitcase in the car. After surgery it may be brought to your room.  For patients admitted to the hospital, discharge time is determined by your treatment team.   Patients discharged the day of surgery will not be allowed to drive home.  You will need someone to drive you home and stay with you the night of your procedure.    Please read over the following fact sheets that you were given:   [facility] Preparing for Surgery and or MRSA Information   ____ Take these medicines the morning of surgery with A SIP OF WATER:    1. NONE  2.  3.  4.  5.  6.  ____Fleets enema or Magnesium Citrate as  directed.   _x___ Use CHG Soap or sage wipes as directed on instruction sheet   ____ Use inhalers on the day of surgery and bring to hospital day of surgery  ____ Stop metformin 2 days prior to surgery    ____ Take 1/2 of usual insulin dose the night before surgery and none on the morning of  surgery.   ____ Stop Aspirin, Coumadin, Pllavix ,Eliquis, Effient, or Pradaxa  x__ Stop Anti-inflammatories such as Advil, Aleve, Ibuprofen, Motrin, Naproxen,          Naprosyn, Goodies powders, EXCEDRIN or aspirin products NOW-Ok to take Tylenol OR PERCOCET IF NEEDED   ____ Stop supplements until after surgery.    ____ Bring C-Pap to the hospital.

## 2016-05-14 ENCOUNTER — Encounter
Admission: RE | Admit: 2016-05-14 | Discharge: 2016-05-14 | Disposition: A | Discharge status: Home / Self Care | Payer: Medicaid Other | Source: Ambulatory Visit | Attending: Surgery | Admitting: Surgery

## 2016-05-14 ENCOUNTER — Telehealth: Payer: Self-pay

## 2016-05-14 DIAGNOSIS — Z01812 Encounter for preprocedural laboratory examination: Secondary | ICD-10-CM | POA: Diagnosis not present

## 2016-05-14 DIAGNOSIS — I10 Essential (primary) hypertension: Secondary | ICD-10-CM | POA: Insufficient documentation

## 2016-05-14 DIAGNOSIS — Z01810 Encounter for preprocedural cardiovascular examination: Secondary | ICD-10-CM | POA: Diagnosis present

## 2016-05-14 LAB — CBC WITH DIFFERENTIAL/PLATELET
BASOS PCT: 1 %
Basophils Absolute: 0.1 10*3/uL (ref 0–0.1)
Eosinophils Absolute: 0.1 10*3/uL (ref 0–0.7)
Eosinophils Relative: 2 %
HEMATOCRIT: 36.4 % (ref 35.0–47.0)
HEMOGLOBIN: 12 g/dL (ref 12.0–16.0)
Lymphocytes Relative: 17 %
Lymphs Abs: 1.1 10*3/uL (ref 1.0–3.6)
MCH: 25.8 pg — ABNORMAL LOW (ref 26.0–34.0)
MCHC: 32.9 g/dL (ref 32.0–36.0)
MCV: 78.7 fL — ABNORMAL LOW (ref 80.0–100.0)
MONOS PCT: 6 %
Monocytes Absolute: 0.4 10*3/uL (ref 0.2–0.9)
NEUTROS PCT: 74 %
Neutro Abs: 4.9 10*3/uL (ref 1.4–6.5)
Platelets: 224 10*3/uL (ref 150–440)
RBC: 4.63 MIL/uL (ref 3.80–5.20)
RDW: 18.5 % — ABNORMAL HIGH (ref 11.5–14.5)
WBC: 6.5 10*3/uL (ref 3.6–11.0)

## 2016-05-14 LAB — BASIC METABOLIC PANEL
ANION GAP: 8 (ref 5–15)
BUN: 19 mg/dL (ref 6–20)
CALCIUM: 8.6 mg/dL — AB (ref 8.9–10.3)
CO2: 30 mmol/L (ref 22–32)
Chloride: 101 mmol/L (ref 101–111)
Creatinine, Ser: 1.36 mg/dL — ABNORMAL HIGH (ref 0.44–1.00)
GFR, EST AFRICAN AMERICAN: 53 mL/min — AB (ref >60)
GFR, EST NON AFRICAN AMERICAN: 46 mL/min — AB (ref >60)
Glucose, Bld: 95 mg/dL (ref 65–99)
Potassium: 3.2 mmol/L — ABNORMAL LOW (ref 3.5–5.1)
SODIUM: 139 mmol/L (ref 135–145)

## 2016-05-14 MED ORDER — POTASSIUM CHLORIDE CRYS ER 20 MEQ PO TBCR: 20.0000 meq | EXTENDED_RELEASE_TABLET | Freq: Two times a day (BID) | ORAL | 0 refills | Status: DC

## 2016-05-14 NOTE — Telephone Encounter (Signed)
Received pre-op potassium of 3.2. Per protocol, will supplement patient with 66meq K-Dur for 5 days prior to surgery. Medication sent to pharmacy. Order placed to recheck the morning of surgery.  Patient has been made aware of this and to pick up potassium at [facility].

## 2016-05-15 ENCOUNTER — Telehealth: Payer: Self-pay

## 2016-05-15 MED ORDER — POTASSIUM CHLORIDE CRYS ER 20 MEQ PO TBCR: 20.0000 meq | EXTENDED_RELEASE_TABLET | Freq: Two times a day (BID) | ORAL | 0 refills | Status: DC

## 2016-05-15 NOTE — Telephone Encounter (Signed)
Medication has been resent to [facility]. Call made to [facility] at this time. Prescription has to be approved by PCP prior to patient being able to pick this up, but prescription is ready per pharmacist.  Call returned to patient and explained above information. She states that she just received a call to come pick up medication. So she is on her way there now and will begin medication as soon as possible.

## 2016-05-15 NOTE — Telephone Encounter (Signed)
Patient is calling about the potassium that was called into her pharmacy yesterday. They are saying that they don't have it. Please resend the prescription in and notify the patient. The pharmacy on file is accurate, [place]

## 2016-05-20 ENCOUNTER — Encounter
Admission: AD | Disposition: A | Discharge status: Home / Self Care | Payer: Self-pay | Source: Ambulatory Visit | Attending: Surgery

## 2016-05-20 ENCOUNTER — Ambulatory Visit
Admission: AD | Admit: 2016-05-20 | Discharge: 2016-05-20 | Disposition: A | Discharge status: Home / Self Care | Payer: Medicaid Other | Source: Ambulatory Visit | Attending: Surgery | Admitting: Surgery

## 2016-05-20 ENCOUNTER — Ambulatory Visit: Payer: Medicaid Other | Admitting: Anesthesiology

## 2016-05-20 ENCOUNTER — Encounter: Payer: Self-pay | Admitting: *Deleted

## 2016-05-20 DIAGNOSIS — R222 Localized swelling, mass and lump, trunk: Secondary | ICD-10-CM | POA: Diagnosis not present

## 2016-05-20 DIAGNOSIS — Z8249 Family history of ischemic heart disease and other diseases of the circulatory system: Secondary | ICD-10-CM | POA: Insufficient documentation

## 2016-05-20 DIAGNOSIS — M1990 Unspecified osteoarthritis, unspecified site: Secondary | ICD-10-CM | POA: Insufficient documentation

## 2016-05-20 DIAGNOSIS — Z9013 Acquired absence of bilateral breasts and nipples: Secondary | ICD-10-CM | POA: Insufficient documentation

## 2016-05-20 DIAGNOSIS — L720 Epidermal cyst: Secondary | ICD-10-CM | POA: Insufficient documentation

## 2016-05-20 DIAGNOSIS — Z853 Personal history of malignant neoplasm of breast: Secondary | ICD-10-CM | POA: Diagnosis not present

## 2016-05-20 HISTORY — PX: MASS EXCISION: SHX2000

## 2016-05-20 LAB — POCT I-STAT 4, (NA,K, GLUC, HGB,HCT)
GLUCOSE: 94 mg/dL (ref 65–99)
HEMATOCRIT: 38 % (ref 36.0–46.0)
HEMOGLOBIN: 12.9 g/dL (ref 12.0–15.0)
Potassium: 3.5 mmol/L (ref 3.5–5.1)
SODIUM: 139 mmol/L (ref 135–145)

## 2016-05-20 LAB — POCT PREGNANCY, URINE: PREG TEST UR: NEGATIVE

## 2016-05-20 SURGERY — EXCISION MASS
Anesthesia: General | Wound class: Clean

## 2016-05-20 MED ORDER — SODIUM CHLORIDE 0.9 % IJ SOLN
INTRAMUSCULAR | Status: AC
  Filled 2016-05-20: qty 10

## 2016-05-20 MED ORDER — ACETAMINOPHEN 10 MG/ML IV SOLN
INTRAVENOUS | Status: DC | PRN
  Administered 2016-05-20: 1000 mg via INTRAVENOUS

## 2016-05-20 MED ORDER — GLYCOPYRROLATE 0.2 MG/ML IJ SOLN
INTRAMUSCULAR | Status: AC
  Filled 2016-05-20: qty 1

## 2016-05-20 MED ORDER — HEPARIN SODIUM (PORCINE) 5000 UNIT/ML IJ SOLN
INTRAMUSCULAR | Status: AC
  Filled 2016-05-20: qty 1

## 2016-05-20 MED ORDER — OXYCODONE HCL 5 MG/5ML PO SOLN: 5.0000 mg | Freq: Once | ORAL | Status: DC | PRN

## 2016-05-20 MED ORDER — GLYCOPYRROLATE 0.2 MG/ML IJ SOLN
INTRAMUSCULAR | Status: DC | PRN
  Administered 2016-05-20: 0.2 mg via INTRAVENOUS

## 2016-05-20 MED ORDER — ONDANSETRON HCL 4 MG/2ML IJ SOLN
INTRAMUSCULAR | Status: AC
  Filled 2016-05-20: qty 2

## 2016-05-20 MED ORDER — CHLORHEXIDINE GLUCONATE CLOTH 2 % EX PADS: 6.0000 | MEDICATED_PAD | Freq: Once | CUTANEOUS | Status: DC

## 2016-05-20 MED ORDER — FAMOTIDINE 20 MG PO TABS
20.0000 mg | ORAL_TABLET | Freq: Once | ORAL | Status: AC
  Administered 2016-05-20: 20 mg via ORAL

## 2016-05-20 MED ORDER — OXYCODONE HCL 5 MG PO TABS: 5.0000 mg | ORAL_TABLET | Freq: Once | ORAL | Status: DC | PRN

## 2016-05-20 MED ORDER — PROPOFOL 10 MG/ML IV BOLUS
INTRAVENOUS | Status: DC | PRN
  Administered 2016-05-20: 200 mg via INTRAVENOUS

## 2016-05-20 MED ORDER — DEXAMETHASONE SODIUM PHOSPHATE 10 MG/ML IJ SOLN
INTRAMUSCULAR | Status: DC | PRN
  Administered 2016-05-20: 30 mg via INTRAVENOUS

## 2016-05-20 MED ORDER — ONDANSETRON HCL 4 MG/2ML IJ SOLN
INTRAMUSCULAR | Status: AC
  Administered 2016-05-20: 4 mg via INTRAVENOUS
  Filled 2016-05-20: qty 2

## 2016-05-20 MED ORDER — PHENYLEPHRINE 40 MCG/ML (10ML) SYRINGE FOR IV PUSH (FOR BLOOD PRESSURE SUPPORT)
PREFILLED_SYRINGE | INTRAVENOUS | Status: AC
  Filled 2016-05-20: qty 10

## 2016-05-20 MED ORDER — LACTATED RINGERS IV SOLN
INTRAVENOUS | Status: DC
  Administered 2016-05-20 (×2): via INTRAVENOUS

## 2016-05-20 MED ORDER — FENTANYL CITRATE (PF) 100 MCG/2ML IJ SOLN
INTRAMUSCULAR | Status: AC
  Filled 2016-05-20: qty 2

## 2016-05-20 MED ORDER — BUPIVACAINE-EPINEPHRINE (PF) 0.25% -1:200000 IJ SOLN
INTRAMUSCULAR | Status: DC | PRN
  Administered 2016-05-20: 30 mL via PERINEURAL

## 2016-05-20 MED ORDER — FENTANYL CITRATE (PF) 100 MCG/2ML IJ SOLN: 25.0000 ug | INTRAMUSCULAR | Status: DC | PRN

## 2016-05-20 MED ORDER — BUPIVACAINE-EPINEPHRINE (PF) 0.25% -1:200000 IJ SOLN
INTRAMUSCULAR | Status: AC
  Filled 2016-05-20: qty 30

## 2016-05-20 MED ORDER — MIDAZOLAM HCL 2 MG/2ML IJ SOLN
INTRAMUSCULAR | Status: AC
  Filled 2016-05-20: qty 2

## 2016-05-20 MED ORDER — SUGAMMADEX SODIUM 500 MG/5ML IV SOLN
INTRAVENOUS | Status: DC | PRN
  Administered 2016-05-20: 325.6 mg via INTRAVENOUS

## 2016-05-20 MED ORDER — SUCCINYLCHOLINE CHLORIDE 20 MG/ML IJ SOLN
INTRAMUSCULAR | Status: DC | PRN
  Administered 2016-05-20: 160 mg via INTRAVENOUS

## 2016-05-20 MED ORDER — HEPARIN SODIUM (PORCINE) 5000 UNIT/ML IJ SOLN
5000.0000 [IU] | Freq: Once | INTRAMUSCULAR | Status: AC
  Administered 2016-05-20: 5000 [IU] via SUBCUTANEOUS

## 2016-05-20 MED ORDER — FAMOTIDINE 20 MG PO TABS
ORAL_TABLET | ORAL | Status: AC
  Filled 2016-05-20: qty 1

## 2016-05-20 MED ORDER — EPHEDRINE SULFATE 50 MG/ML IJ SOLN
INTRAMUSCULAR | Status: AC
  Filled 2016-05-20: qty 1

## 2016-05-20 MED ORDER — OXYCODONE-ACETAMINOPHEN 5-325 MG PO TABS: 1.0000 | ORAL_TABLET | ORAL | 0 refills | Status: DC | PRN

## 2016-05-20 MED ORDER — PROPOFOL 10 MG/ML IV BOLUS
INTRAVENOUS | Status: AC
  Filled 2016-05-20: qty 40

## 2016-05-20 MED ORDER — DEXAMETHASONE SODIUM PHOSPHATE 10 MG/ML IJ SOLN
INTRAMUSCULAR | Status: AC
  Filled 2016-05-20: qty 1

## 2016-05-20 MED ORDER — ROCURONIUM BROMIDE 100 MG/10ML IV SOLN
INTRAVENOUS | Status: DC | PRN
  Administered 2016-05-20: 30 mg via INTRAVENOUS
  Administered 2016-05-20: 10 mg via INTRAVENOUS

## 2016-05-20 MED ORDER — ONDANSETRON HCL 4 MG/2ML IJ SOLN
INTRAMUSCULAR | Status: DC | PRN
  Administered 2016-05-20: 4 mg via INTRAVENOUS

## 2016-05-20 MED ORDER — SUGAMMADEX SODIUM 500 MG/5ML IV SOLN
INTRAVENOUS | Status: DC | PRN
Start: 2016-05-20 — End: 2016-05-20

## 2016-05-20 MED ORDER — MIDAZOLAM HCL 2 MG/2ML IJ SOLN
INTRAMUSCULAR | Status: DC | PRN
  Administered 2016-05-20: 2 mg via INTRAVENOUS

## 2016-05-20 MED ORDER — FENTANYL CITRATE (PF) 100 MCG/2ML IJ SOLN
INTRAMUSCULAR | Status: DC | PRN
  Administered 2016-05-20 (×2): 50 ug via INTRAVENOUS

## 2016-05-20 MED ORDER — ONDANSETRON HCL 4 MG/2ML IJ SOLN
4.0000 mg | Freq: Once | INTRAMUSCULAR | Status: AC
  Administered 2016-05-20: 4 mg via INTRAVENOUS

## 2016-05-20 MED ORDER — ACETAMINOPHEN 10 MG/ML IV SOLN
INTRAVENOUS | Status: AC
  Filled 2016-05-20: qty 100

## 2016-05-20 SURGICAL SUPPLY — 23 items
BLADE SURG 15 STRL LF DISP TIS (BLADE) ×1 IMPLANT
BLADE SURG 15 STRL SS (BLADE) ×2
CHLORAPREP W/TINT 26ML (MISCELLANEOUS) ×3 IMPLANT
CLOSURE WOUND 1/4X4 (GAUZE/BANDAGES/DRESSINGS) ×1
DRAPE LAPAROTOMY 100X77 ABD (DRAPES) ×3 IMPLANT
ELECT CAUTERY BLADE 6.4 (BLADE) ×3 IMPLANT
ELECT REM PT RETURN 9FT ADLT (ELECTROSURGICAL) ×3
ELECTRODE REM PT RTRN 9FT ADLT (ELECTROSURGICAL) ×1 IMPLANT
GLOVE BIO SURGEON STRL SZ8 (GLOVE) ×3 IMPLANT
GOWN STRL REUS W/ TWL LRG LVL3 (GOWN DISPOSABLE) ×2 IMPLANT
GOWN STRL REUS W/TWL LRG LVL3 (GOWN DISPOSABLE) ×4
LABEL OR SOLS (LABEL) ×3 IMPLANT
NEEDLE HYPO 22GX1.5 SAFETY (NEEDLE) ×3 IMPLANT
NS IRRIG 500ML POUR BTL (IV SOLUTION) ×3 IMPLANT
SPONGE LAP 18X18 5 PK (GAUZE/BANDAGES/DRESSINGS) ×3 IMPLANT
STRIP CLOSURE SKIN 1/4X4 (GAUZE/BANDAGES/DRESSINGS) ×2 IMPLANT
SUT ETHILON 3-0 FS-10 30 BLK (SUTURE) ×3
SUT VIC AB 2-0 CT2 27 (SUTURE) ×6 IMPLANT
SUT VIC AB 3-0 SH 27 (SUTURE) ×2
SUT VIC AB 3-0 SH 27X BRD (SUTURE) ×1 IMPLANT
SUTURE EHLN 3-0 FS-10 30 BLK (SUTURE) ×1 IMPLANT
SYR BULB EAR ULCER 3OZ GRN STR (SYRINGE) ×3 IMPLANT
SYRINGE 10CC LL (SYRINGE) ×3 IMPLANT

## 2016-05-20 NOTE — Transfer of Care (Signed)
Immediate Anesthesia Transfer of Care Note  Patient: Michelle Mcmillan  Procedure(s) Performed: Procedure(s): EXCISION CHEST WALL MASS (N/A)  Patient Location: PACU  Anesthesia Type:General  Level of Consciousness: awake and sedated  Airway & Oxygen Therapy: Patient Spontanous Breathing and Patient connected to face mask oxygen  Post-op Assessment: Report given to RN and Post -op Vital signs reviewed and stable  Post vital signs: Reviewed and stable  Last Vitals:  Vitals:   05/20/16 0942  BP: 128/88  Pulse: 79  Resp: 16  Temp: 36.7 C    Last Pain:  Vitals:   05/20/16 0942  TempSrc: Oral         Complications: No apparent anesthesia complications

## 2016-05-20 NOTE — Anesthesia Procedure Notes (Signed)
Procedure Name: Intubation Date/Time: 05/20/2016 11:15 AM Performed by: Nelda Marseille Pre-anesthesia Checklist: Patient identified, Patient being monitored, Timeout performed, Emergency Drugs available and Suction available Patient Re-evaluated:Patient Re-evaluated prior to inductionOxygen Delivery Method: Circle system utilized Preoxygenation: Pre-oxygenation with 100% oxygen Intubation Type: IV induction Ventilation: Mask ventilation without difficulty Laryngoscope Size: Mac and 3 Grade View: Grade II Tube type: Oral Tube size: 7.0 mm Number of attempts: 1 Airway Equipment and Method: Stylet and Video-laryngoscopy Placement Confirmation: ETT inserted through vocal cords under direct vision,  positive ETCO2 and breath sounds checked- equal and bilateral Secured at: 21 cm Tube secured with: Tape Dental Injury: Teeth and Oropharynx as per pre-operative assessment

## 2016-05-20 NOTE — Op Note (Signed)
05/20/2016  11:37 AM  PATIENT:  Michelle Mcmillan  46 y.o. female  PRE-OPERATIVE DIAGNOSIS:  Left chest wall mass  POST-OPERATIVE DIAGNOSIS:  Same  PROCEDURE: Excisional biopsy of left chest wall mass  SURGEON:  Florene Glen MD, FACS   ANESTHESIA: Gen. with endotracheal tube   Details of Procedure: This patient with a history of carcinoma the breast who presents with a mass in the left inframammary fold. She desires excision. Preoperatively discussed rationale for surgery the options of observation risk bleeding infection recurrence and cosmetic deformity patient was marked in the preop holding area she understood and agreed to proceed.  She was induced general anesthesia prepped draped sterile fashion a surgical pause was performed and local anesthetic was infiltrated into the skin and subcutaneous tissues tissues around the visible palpable and previously marked mass. A lenticular shaped incision was drawn out and then executed sharply and with the use of electrocautery. Was adequate and the mass was sent off for examination it was measured at 3 cm at the deep margin. And the incision was measured at 8 cm. The wound was then closed in an intermediate fashion utilizing a deep layer of 2-0 Vicryl followed by a more superficial layer of 3-0 Vicryl and then a subcuticular layer of 4-0 Monocryl and Steri-Strips placed. A sterile dressing was placed. Patient out of the procedure well the workup occasions he was taken to recovery room in stable condition to be discharged care of her family and follow-up in 10 days.   Florene Glen, MD FACS

## 2016-05-20 NOTE — Progress Notes (Signed)
Preoperative Review   Patient is met in the preoperative holding area. The history is reviewed in the chart and with the patient. I personally reviewed the options and rationale as well as the risks of this procedure that have been previously discussed with the patient. All questions asked by the patient and/or family were answered to their satisfaction.  Patient agrees to proceed with this procedure at this time.  Michelle Mcmillan E Kycen Spalla M.D. FACS  

## 2016-05-20 NOTE — Discharge Instructions (Signed)
Remove dressing in 24 hours. °May shower in 24 hours. °Leave paper strips in place. °Resume all home medications. °Follow-up with Dr. Cooper in 10 days. ° °AMBULATORY SURGERY  °DISCHARGE INSTRUCTIONS ° ° °1) The drugs that you were given will stay in your system until tomorrow so for the next 24 hours you should not: ° °A) Drive an automobile °B) Make any legal decisions °C) Drink any alcoholic beverage ° ° °2) You may resume regular meals tomorrow.  Today it is better to start with liquids and gradually work up to solid foods. ° °You may eat anything you prefer, but it is better to start with liquids, then soup and crackers, and gradually work up to solid foods. ° ° °3) Please notify your doctor immediately if you have any unusual bleeding, trouble breathing, redness and pain at the surgery site, drainage, fever, or pain not relieved by medication. ° ° ° °4) Additional Instructions: ° ° ° ° ° ° ° °Please contact your physician with any problems or Same Day Surgery at [phone], Monday through Friday 6 am to 4 pm, or [place] [facility] [place] number at [phone]. °

## 2016-05-20 NOTE — Anesthesia Postprocedure Evaluation (Signed)
Anesthesia Post Note  Patient: Michelle Mcmillan  Procedure(s) Performed: Procedure(s) (LRB): EXCISION CHEST WALL MASS (N/A)  Patient location during evaluation: PACU Anesthesia Type: General Level of consciousness: awake and alert Pain management: pain level controlled Vital Signs Assessment: post-procedure vital signs reviewed and stable Respiratory status: spontaneous breathing, nonlabored ventilation, respiratory function stable and patient connected to nasal cannula oxygen Cardiovascular status: blood pressure returned to baseline and stable Postop Assessment: no signs of nausea or vomiting Anesthetic complications: no     Last Vitals:  Vitals:   05/20/16 1227 05/20/16 1256  BP: (!) 142/76 138/77  Pulse: 66 66  Resp: 16 16  Temp: 36.6 C     Last Pain:  Vitals:   05/20/16 1227  TempSrc: Oral                 Precious Haws Hernan [place]

## 2016-05-20 NOTE — Anesthesia Post-op Follow-up Note (Cosign Needed)
Anesthesia QCDR form completed.        

## 2016-05-20 NOTE — Anesthesia Preprocedure Evaluation (Addendum)
Anesthesia Evaluation  Patient identified by MRN, date of birth, ID band Patient awake    Reviewed: Allergy & Precautions, H&P , NPO status , Patient's Chart, lab work & pertinent test results  History of Anesthesia Complications Negative for: history of anesthetic complications  Airway Mallampati: III  TM Distance: >3 FB Neck ROM: full    Dental  (+) Poor Dentition, Chipped, Caps   Pulmonary neg pulmonary ROS, neg shortness of breath,    Pulmonary exam normal breath sounds clear to auscultation       Cardiovascular Exercise Tolerance: Good hypertension, (-) angina(-) Past MI and (-) DOE Normal cardiovascular exam Rhythm:regular Rate:Normal     Neuro/Psych  Headaches, negative psych ROS   GI/Hepatic negative GI ROS, Neg liver ROS, GERD  Controlled,  Endo/Other  Morbid obesity  Renal/GU negative Renal ROS  negative genitourinary   Musculoskeletal  (+) Arthritis ,   Abdominal   Peds  Hematology negative hematology ROS (+)   Anesthesia Other Findings Signs and symptoms suggestive of sleep apnea   Past Medical History:   Hypertension                                                 Headache                                                       Comment:migraines  Past Surgical History:   [place]           Comment:x1   TUBAL LIGATION                                   2004         BREAST EXCISIONAL BIOPSY                        Right 06/15/15        Comment:breast cancer   Signs and symptoms suggestive of sleep apnea    Reproductive/Obstetrics negative OB ROS                            Anesthesia Physical  Anesthesia Plan  ASA: III  Anesthesia Plan: General ETT   Post-op Pain Management:    Induction:   Airway Management Planned:   Additional Equipment:   Intra-op Plan:   Post-operative Plan:   Informed Consent: I have  reviewed the patients History and Physical, chart, labs and discussed the procedure including the risks, benefits and alternatives for the proposed anesthesia with the patient or authorized representative who has indicated his/her understanding and acceptance.   Dental Advisory Given  Plan Discussed with: Anesthesiologist, CRNA and Surgeon  Anesthesia Plan Comments:         Anesthesia Quick Evaluation

## 2016-05-21 LAB — SURGICAL PATHOLOGY

## 2016-05-22 ENCOUNTER — Telehealth: Payer: Self-pay

## 2016-05-22 NOTE — Telephone Encounter (Signed)
Reviewed pathology results with patient at this time. She is elated that this was benign. She verbalizes understanding of all results discussed.  We will see her back as scheduled next week in office to check on incision site.

## 2016-05-24 ENCOUNTER — Encounter: Payer: Self-pay | Admitting: Surgery

## 2016-05-24 NOTE — OR Nursing (Signed)
This patient did not have a bilateral mastectomy. I deleted this procedure from the history.  Sharmon Leyden, 05/24/2016 1540

## 2016-05-27 ENCOUNTER — Encounter: Payer: Self-pay | Admitting: Surgery

## 2016-05-27 ENCOUNTER — Ambulatory Visit (INDEPENDENT_AMBULATORY_CARE_PROVIDER_SITE_OTHER): Payer: Medicaid Other | Admitting: Surgery

## 2016-05-27 VITALS — BP 123/85 | HR 96 | Temp 98.3°F | Wt 353.0 lb

## 2016-05-27 DIAGNOSIS — R222 Localized swelling, mass and lump, trunk: Secondary | ICD-10-CM

## 2016-05-27 NOTE — Progress Notes (Signed)
05/27/2016  HPI: Patient is status post excision of left chest wall mass on 2/19 with Dr. Burt Knack. Patient presents for follow-up. Reports that initially she did have some serosanguineous fluid draining from the incision on the first day after surgery but otherwise that has subsided now. Denies any major pain. She had been taking her oxycodone for pain control initially and had been a little constipated. Denies any fevers or chills.  Vital signs: BP 123/85   Pulse 96   Temp 98.3 F (36.8 C) (Oral)   Wt (!) 160.1 kg (353 lb)   BMI 52.13 kg/m    Physical Exam: Constitutional: No acute distress Chest: Left-sided chest incision is clean dry and intact with the midportion of the skin edges 1 mm apart. There is no drainage or induration of the incision. No evidence of infection.  Assessment/Plan: 46 year old female status post excision of left chest wall mass.  -Reviewed pathology report with the patient. -Reassured the patient that the wound is healing well and currently there is no infection. Recommended that she apply a dry gauze dressing to the wound daily and as needed to keep the area dry so that it heals better. -Patient can shower but recommended still no submersion of the wound for another 2 weeks. -Patient may take MiraLAX for constipation as needed. -Patient follow-up with [place] on an as-needed basis   Melvyn Neth, [place]

## 2016-05-27 NOTE — Patient Instructions (Signed)
Please call our office with any questions or concerns.  Please do not submerge in a tub, hot tub, or pool until incisions are completely sealed.  Use sun block to incision area over the next year if this area will be exposed to sun. This helps decrease scarring.  You may resume your normal activities on 05/28/2016. At that time- Listen to your body when lifting, if you have pain when lifting, stop and then try again in a few days. Pain after doing exercises or activities of daily living is normal as you get back in to your normal routine.  If you develop redness, drainage, or pain at incision sites- call our office immediately and speak with a nurse.  For you constipation, please take 17 G of Miralax daily.

## 2016-05-29 ENCOUNTER — Encounter: Payer: Medicaid Other | Admitting: Surgery

## 2016-06-06 ENCOUNTER — Other Ambulatory Visit: Payer: Medicaid Other

## 2016-06-06 ENCOUNTER — Ambulatory Visit: Payer: Medicaid Other | Admitting: Oncology

## 2016-06-11 ENCOUNTER — Other Ambulatory Visit: Payer: Self-pay

## 2016-06-11 DIAGNOSIS — C50411 Malignant neoplasm of upper-outer quadrant of right female breast: Secondary | ICD-10-CM

## 2016-06-11 NOTE — Progress Notes (Signed)
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   INTERVAL HISTORY: Patient returns to clinic today for routine 3 month evaluation. Patient is tolerating Aromasin well without significant side effects. She has occasional hot flashes that do not affect her day-to-day activity. She has no neurologic complaints. She denies any recent fevers or illnesses. She has a good appetite and denies weight loss. She denies any pain. She has no chest pain or shortness of breath. She denies any nausea, vomiting, constipation, or diarrhea. She has no urinary complaints. Patient offers no specific complaints today.  REVIEW OF SYSTEMS:   Review of Systems  Constitutional: Negative for fever, malaise/fatigue and weight loss.  Respiratory: Negative.  Negative for cough and shortness of breath.   Cardiovascular: Negative.  Negative for chest pain and leg swelling.  Gastrointestinal: Negative.  Negative for abdominal pain.  Genitourinary: Negative.   Musculoskeletal: Negative.  Negative for myalgias.  Neurological: Positive for sensory change. Negative for weakness.  Psychiatric/Behavioral: Negative.  The patient is not nervous/anxious.     As per HPI. Otherwise, a complete review of systems is negative.  PAST MEDICAL HISTORY: Past Medical History:  Diagnosis Date  . Anemia    H/O  . Arthritis    RIGHT KNEE  . Breast cancer ([place]) 2017   right breast  . Cancer ([place])    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)  . GERD (gastroesophageal reflux disease)    NO MEDS  . Headache    migraines  . Hypertension   . Last menstrual period (LMP) > 10  days ago 08/2015    PAST SURGICAL HISTORY: Past Surgical History:  Procedure Laterality Date  . BREAST EXCISIONAL BIOPSY Right 06/15/15   breast cancer  . BREAST LUMPECTOMY WITH NEEDLE LOCALIZATION Right 06/15/2015   Procedure: BREAST LUMPECTOMY WITH NEEDLE LOCALIZATION;  Surgeon: Hubbard Robinson, MD;  Location: [facility] ORS;  Service: General;  Laterality: Right;  . CESAREAN SECTION  1988   x1  . MASS EXCISION N/A 05/20/2016   Procedure: EXCISION CHEST WALL MASS;  Surgeon: Florene Glen, MD;  Location: [facility] ORS;  Service: General;  Laterality: N/A;  . SENTINEL NODE BIOPSY Right 06/15/2015   Procedure: SENTINEL NODE BIOPSY;  Surgeon: Hubbard Robinson, MD;  Location: [facility] ORS;  Service: General;  Laterality: Right;  . TUBAL LIGATION  2004    FAMILY HISTORY Family History  Problem Relation Age of Onset  . Hypertension Mother   . Hypertension Father   . Hypertension Sister   . Breast cancer Neg Hx        ADVANCED DIRECTIVES:    HEALTH MAINTENANCE: Social History  Substance Use Topics  . Smoking status: Never Smoker  . Smokeless tobacco: Never Used  . Alcohol use Yes     Comment: WINE ONCE MONTH    No Known Allergies  Current Outpatient Prescriptions  Medication Sig Dispense Refill  . aspirin-acetaminophen-caffeine (EXCEDRIN MIGRAINE) 250-250-65 MG tablet Take 2 tablets by mouth daily as needed for headache or migraine.     . calcium-vitamin D (OSCAL WITH D) 500-200 MG-UNIT tablet Take 1 tablet by mouth 2 (two) times daily.    Marland Kitchen exemestane (AROMASIN)  25 MG tablet Take 1 tablet (25 mg total) by mouth daily after breakfast. 30 tablet 6  . hydrochlorothiazide (HYDRODIURIL) 25 MG tablet Take 25 mg by mouth daily.    Marland Kitchen ibuprofen (ADVIL,MOTRIN) 800 MG tablet Take 800 mg by mouth every 8 (eight) hours as needed for moderate pain.     . Menthol, Topical Analgesic, (ICY HOT EX) Apply 1 application topically 2 (two) times daily as needed (pain).    Marland Kitchen oxyCODONE-acetaminophen  (PERCOCET) 5-325 MG tablet Take 1 tablet by mouth every 4 (four) hours as needed for severe pain. 15 tablet 0  . Prenatal Vit-Fe Fumarate-FA (PRENATAL VITAMIN PO) Take 1 tablet by mouth daily.    Marland Kitchen tetrahydrozoline 0.05 % ophthalmic solution Place 2 drops into both eyes daily.    Marland Kitchen tiZANidine (ZANAFLEX) 2 MG tablet Take 2 mg by mouth every 6 (six) hours as needed for muscle spasms.     No current facility-administered medications for this visit.     OBJECTIVE: Vitals:   06/12/16 1036  BP: 125/84  Pulse: 70  Resp: 18  Temp: (!) 96.8 F (36 C)     Body mass index is 51.67 kg/m.    ECOG FS:0 - Asymptomatic  General: Well-developed, well-nourished, no acute distress. Eyes: Pink conjunctiva, anicteric sclera. Breasts: Patient requested exam be deferred today. Exam recently performed by other provider. Lungs: Clear to auscultation bilaterally. Heart: Regular rate and rhythm. No rubs, murmurs, or gallops. Abdomen: Soft, nontender, nondistended. No organomegaly noted, normoactive bowel sounds. Musculoskeletal: No edema, cyanosis, or clubbing. Neuro: Alert, answering all questions appropriately. Cranial nerves grossly intact. Skin: No rashes or petechiae noted. Psych: Normal affect.   LAB RESULTS:  Lab Results  Component Value Date   NA 137 06/12/2016   K 3.3 (L) 06/12/2016   CL 101 06/12/2016   CO2 32 06/12/2016   GLUCOSE 113 (H) 06/12/2016   BUN 14 06/12/2016   CREATININE 1.13 (H) 06/12/2016   CALCIUM 8.7 (L) 06/12/2016   PROT 6.9 06/12/2016   ALBUMIN 3.2 (L) 06/12/2016   AST 21 06/12/2016   ALT 20 06/12/2016   ALKPHOS 82 06/12/2016   BILITOT 0.5 06/12/2016   GFRNONAA 57 (L) 06/12/2016   GFRAA >60 06/12/2016    Lab Results  Component Value Date   WBC 6.0 06/12/2016   NEUTROABS 4.5 06/12/2016   HGB 11.8 (L) 06/12/2016   HCT 36.0 06/12/2016   MCV 78.0 (L) 06/12/2016   PLT 252 06/12/2016     STUDIES: No results found.  ASSESSMENT:  Pathologic stage Ia ER/PR  positive, HER-2 negative adenocarcinoma of the upper outer quadrant of the right breast. BRCA 1 and 2 negative, Oncotype DX score 17 which is considered low risk.  PLAN:    1. 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 Patient completed adjuvant XRT on September 19, 2015. Because of her possible side effects of hair loss and lethargy with tamoxifen, patient was switched to Aromasin. Continue Aromasin for 5 years completing in June 2022.  Patient's most recent mammogram on March 02, 2017 was reported as BI-RADS 2, repeat in one year. Bone mineral density is scheduled for June 2018. Return to clinic in 6 months for further evaluation. 2. Genetic testing: Patient was found to be BRCA 1 and 2 negative. She expressed understanding that although her genetic testing was negative,  her children are at higher risk for breast cancer than the general population.    Patient expressed understanding and was in agreement with this plan. She also understands that She can call clinic at any time with any questions, concerns, or complaints.    Timothy J Finnegan, MD 06/12/16 1:05 PM         

## 2016-06-12 ENCOUNTER — Inpatient Hospital Stay: Payer: Medicaid Other | Attending: Oncology

## 2016-06-12 ENCOUNTER — Inpatient Hospital Stay (HOSPITAL_BASED_OUTPATIENT_CLINIC_OR_DEPARTMENT_OTHER): Payer: Medicaid Other | Admitting: Oncology

## 2016-06-12 VITALS — BP 125/84 | HR 70 | Temp 96.8°F | Resp 18 | Wt 349.9 lb

## 2016-06-12 DIAGNOSIS — K219 Gastro-esophageal reflux disease without esophagitis: Secondary | ICD-10-CM | POA: Insufficient documentation

## 2016-06-12 DIAGNOSIS — Z79811 Long term (current) use of aromatase inhibitors: Secondary | ICD-10-CM

## 2016-06-12 DIAGNOSIS — Z170 Estrogen receptor positive status [ER+]: Secondary | ICD-10-CM | POA: Insufficient documentation

## 2016-06-12 DIAGNOSIS — Z79899 Other long term (current) drug therapy: Secondary | ICD-10-CM | POA: Insufficient documentation

## 2016-06-12 DIAGNOSIS — R232 Flushing: Secondary | ICD-10-CM | POA: Insufficient documentation

## 2016-06-12 DIAGNOSIS — M1990 Unspecified osteoarthritis, unspecified site: Secondary | ICD-10-CM

## 2016-06-12 DIAGNOSIS — C50411 Malignant neoplasm of upper-outer quadrant of right female breast: Secondary | ICD-10-CM | POA: Diagnosis present

## 2016-06-12 DIAGNOSIS — Z7982 Long term (current) use of aspirin: Secondary | ICD-10-CM | POA: Diagnosis not present

## 2016-06-12 DIAGNOSIS — I10 Essential (primary) hypertension: Secondary | ICD-10-CM | POA: Diagnosis not present

## 2016-06-12 LAB — CBC WITH DIFFERENTIAL/PLATELET
Basophils Absolute: 0 10*3/uL (ref 0–0.1)
Basophils Relative: 1 %
EOS ABS: 0.1 10*3/uL (ref 0–0.7)
Eosinophils Relative: 2 %
HCT: 36 % (ref 35.0–47.0)
HEMOGLOBIN: 11.8 g/dL — AB (ref 12.0–16.0)
LYMPHS ABS: 1 10*3/uL (ref 1.0–3.6)
LYMPHS PCT: 17 %
MCH: 25.4 pg — AB (ref 26.0–34.0)
MCHC: 32.6 g/dL (ref 32.0–36.0)
MCV: 78 fL — AB (ref 80.0–100.0)
MONOS PCT: 6 %
Monocytes Absolute: 0.4 10*3/uL (ref 0.2–0.9)
NEUTROS PCT: 74 %
Neutro Abs: 4.5 10*3/uL (ref 1.4–6.5)
Platelets: 252 10*3/uL (ref 150–440)
RBC: 4.62 MIL/uL (ref 3.80–5.20)
RDW: 18.4 % — ABNORMAL HIGH (ref 11.5–14.5)
WBC: 6 10*3/uL (ref 3.6–11.0)

## 2016-06-12 LAB — COMPREHENSIVE METABOLIC PANEL
ALK PHOS: 82 U/L (ref 38–126)
ALT: 20 U/L (ref 14–54)
AST: 21 U/L (ref 15–41)
Albumin: 3.2 g/dL — ABNORMAL LOW (ref 3.5–5.0)
Anion gap: 4 — ABNORMAL LOW (ref 5–15)
BILIRUBIN TOTAL: 0.5 mg/dL (ref 0.3–1.2)
BUN: 14 mg/dL (ref 6–20)
CALCIUM: 8.7 mg/dL — AB (ref 8.9–10.3)
CO2: 32 mmol/L (ref 22–32)
Chloride: 101 mmol/L (ref 101–111)
Creatinine, Ser: 1.13 mg/dL — ABNORMAL HIGH (ref 0.44–1.00)
GFR, EST NON AFRICAN AMERICAN: 57 mL/min — AB (ref >60)
Glucose, Bld: 113 mg/dL — ABNORMAL HIGH (ref 65–99)
Potassium: 3.3 mmol/L — ABNORMAL LOW (ref 3.5–5.1)
SODIUM: 137 mmol/L (ref 135–145)
TOTAL PROTEIN: 6.9 g/dL (ref 6.5–8.1)

## 2016-06-12 NOTE — Progress Notes (Signed)
Offers no complaints. States is feeling well. Last breast exam was performed last month by Dr. Baruch Gouty and Dr. Burt Knack. Had minor surgery to remove cyst on left breast last month as well.

## 2016-07-02 ENCOUNTER — Telehealth: Payer: Self-pay | Admitting: *Deleted

## 2016-07-02 MED ORDER — EXEMESTANE 25 MG PO TABS: 25.0000 mg | ORAL_TABLET | Freq: Every day | ORAL | 6 refills | Status: DC

## 2016-07-02 NOTE — Telephone Encounter (Signed)
Order refilled

## 2016-09-04 ENCOUNTER — Ambulatory Visit
Admission: RE | Admit: 2016-09-04 | Discharge: 2016-09-04 | Disposition: A | Discharge status: Home / Self Care | Payer: Medicaid Other | Source: Ambulatory Visit | Attending: Oncology | Admitting: Oncology

## 2016-09-04 DIAGNOSIS — Z79811 Long term (current) use of aromatase inhibitors: Secondary | ICD-10-CM | POA: Insufficient documentation

## 2016-09-04 DIAGNOSIS — C50411 Malignant neoplasm of upper-outer quadrant of right female breast: Secondary | ICD-10-CM | POA: Diagnosis present

## 2016-09-04 DIAGNOSIS — R296 Repeated falls: Secondary | ICD-10-CM | POA: Diagnosis not present

## 2016-09-05 ENCOUNTER — Ambulatory Visit: Payer: Medicaid Other

## 2016-11-12 ENCOUNTER — Ambulatory Visit (INDEPENDENT_AMBULATORY_CARE_PROVIDER_SITE_OTHER): Payer: Medicaid Other | Admitting: Surgery

## 2016-11-12 ENCOUNTER — Encounter: Payer: Self-pay | Admitting: Surgery

## 2016-11-12 ENCOUNTER — Ambulatory Visit: Payer: Medicaid Other | Admitting: Surgery

## 2016-11-12 ENCOUNTER — Telehealth: Payer: Self-pay

## 2016-11-12 VITALS — BP 153/90 | HR 65 | Temp 99.0°F | Ht 69.0 in | Wt 354.0 lb

## 2016-11-12 DIAGNOSIS — Z853 Personal history of malignant neoplasm of breast: Secondary | ICD-10-CM

## 2016-11-12 DIAGNOSIS — C50911 Malignant neoplasm of unspecified site of right female breast: Secondary | ICD-10-CM | POA: Diagnosis not present

## 2016-11-12 NOTE — Progress Notes (Signed)
Outpatient Surgical Follow Up  11/12/2016  Michelle Mcmillan is an 46 y.o. female.   CC: Breast cancer  HPI: This patient with a past history of breast cancer who had a benign lump removed from her inframammary fold earlier this year. She was asked to return today for physical exam only no imaging was necessary as she had a benign-appearing mammogram at the beginning of the year.  Patient describes heartburn and nausea but no abdominal pain no right upper quadrant pain and it is any time a day not postprandial.  Past Medical History:  Diagnosis Date  . Anemia    H/O  . Arthritis    RIGHT KNEE  . Breast cancer ([place]) 2017   right breast  . Cancer ([place])    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)  . GERD (gastroesophageal reflux disease)    NO MEDS  . Headache    migraines  . Hypertension   . Last menstrual period (LMP) > 10 days ago 08/2015    Past Surgical History:  Procedure Laterality Date  . BREAST EXCISIONAL BIOPSY Right 06/15/15   breast cancer  . BREAST LUMPECTOMY WITH NEEDLE LOCALIZATION Right 06/15/2015   Procedure: BREAST LUMPECTOMY WITH NEEDLE LOCALIZATION;  Surgeon: Hubbard Robinson, MD;  Location: [facility] ORS;  Service: General;  Laterality: Right;  . CESAREAN SECTION  1988   x1  . MASS EXCISION N/A 05/20/2016   Procedure: EXCISION CHEST WALL MASS;  Surgeon: Florene Glen, MD;  Location: [facility] ORS;  Service: General;  Laterality: N/A;  . SENTINEL NODE BIOPSY Right 06/15/2015   Procedure: SENTINEL NODE BIOPSY;  Surgeon: Hubbard Robinson, MD;  Location: [facility] ORS;  Service: General;  Laterality: Right;  . TUBAL LIGATION  2004    Family History  Problem Relation Age of Onset  . Hypertension Mother   . Hypertension Father   . Hypertension Sister   . Breast cancer Neg Hx     Social History:  reports that she has never smoked. She has never used smokeless tobacco. She reports that she drinks alcohol. She reports that she does not use drugs.  Allergies: No Known  Allergies  Medications reviewed.   Review of Systems:   Review of Systems  Constitutional: Negative.   HENT: Negative.   Eyes: Negative.   Respiratory: Negative.   Cardiovascular: Negative.   Gastrointestinal: Positive for heartburn, nausea and vomiting. Negative for abdominal pain and diarrhea.  Genitourinary: Negative.   Musculoskeletal: Negative.   Skin: Negative.   Neurological: Negative.   Endo/Heme/Allergies: Negative.   Psychiatric/Behavioral: Negative.      Physical Exam:  There were no vitals taken for this visit.  Physical Exam  Constitutional: She is well-developed, well-nourished, and in no distress. No distress.  HENT:  Head: Atraumatic.  Eyes: Pupils are equal, round, and reactive to light. Right eye exhibits no discharge. Left eye exhibits no discharge. No scleral icterus.  Neck: Normal range of motion. No JVD present.  Pulmonary/Chest: Effort normal. No respiratory distress. She has no wheezes.    Lymphadenopathy:    She has no cervical adenopathy.  Skin: Skin is warm and dry. She is not diaphoretic.  Vitals reviewed.     No results found for this or any previous visit (from the past 48 hour(s)). No results found.  Assessment/Plan:  Patient doing very well except for some unusual nausea symptoms for which I will refer her to GI. She's never had a GI evaluation before. This may be a beneficial and she is seeing oncology  and radiation oncology in the next few months. She'll follow-up with [place] in January with a new mammogram.  Florene Glen, MD, FACS

## 2016-11-12 NOTE — Patient Instructions (Signed)
We will send you to [facility] for an appointment regarding your Heartburn and Vomiting during sleep. Their office will contact you with an appointment. We have requested the female provider per your request.  We will see you back in 6 months for your mammogram and follow-up appointment with Dr. Burt Knack.  Please continue to do your monthly self exams and if you find a new lump or any concerns at all, call our office immediately.   Breast Self-Awareness Introduction Breast self-awareness means:  Knowing how your breasts look.  Knowing how your breasts feel.  Checking your breasts every month for changes.  Telling your doctor if you notice a change in your breasts. Breast self-awareness allows you to notice a breast problem early while it is still small. How to do a breast self-exam One way to learn what is normal for your breasts and to check for changes is to do a breast self-exam. To do a breast self-exam: Look for Changes  1. Take off all the clothes above your waist. 2. Stand in front of a mirror in a room with good lighting. 3. Put your hands on your hips. 4. Push your hands down. 5. Look at your breasts and nipples in the mirror to see if one breast or nipple looks different than the other. Check to see if:  The shape of one breast is different.  The size of one breast is different.  There are wrinkles, dips, and bumps in one breast and not the other. 6. Look at each breast for changes in your skin, such as:  Redness.  Scaly areas. 7. Look for changes in your nipples, such as:  Liquid around the nipples.  Bleeding.  Dimpling.  Redness.  A change in where the nipples are. Feel for Changes 1. Lie on your back on the floor. 2. Feel each breast. To do this, follow these steps:  Pick a breast to feel.  Put the arm closest to that breast above your head.  Use your other arm to feel the nipple area of your breast. Feel the area with the pads of your three middle  fingers by making small circles with your fingers. For the first circle, press lightly. For the second circle, press harder. For the third circle, press even harder.  Keep making circles with your fingers at the light, harder, and even harder pressures as you move down your breast. Stop when you feel your ribs.  Move your fingers a little toward the center of your body.  Start making circles with your fingers again, this time going up until you reach your collarbone.  Keep making up and down circles until you reach your armpit. Remember to keep using the three pressures.  Feel the other breast in the same way. 3. Sit or stand in the shower or tub. 4. With soapy water on your skin, feel each breast the same way you did in step 2, when you were lying on the floor. Write Down What You Find  After doing the self-exam, write down:  What is normal for each breast.  Any changes you find in each breast.  When you last had your period. How often should I check my breasts? Check your breasts every month. If you are breastfeeding, the best time to check them is after you feed your baby or after you use a breast pump. If you get periods, the best time to check your breasts is 5-7 days after your period is over. When should  I see my doctor? See your doctor if you notice:  A change in shape or size of your breasts or nipples.  A change in the skin of your breast or nipples, such as red or scaly skin.  Unusual fluid coming from your nipples.  A lump or thick area that was not there before.  Pain in your breasts.  Anything that concerns you. This information is not intended to replace advice given to you by your health care provider. Make sure you discuss any questions you have with your health care provider. Document Released: 09/04/2007 Document Revised: 08/24/2015 Document Reviewed: 02/05/2015  2017 Elsevier

## 2016-11-12 NOTE — Telephone Encounter (Signed)
Orders placed for 2 years.   Mammogram scheduled for 05/06/16 at 1120am at [facility].  Follow-up appointment scheduled with Dr. Burt Knack for 05/08/16.  Patient has been informed of all appointment information and is encouraged to call back with any questions or concerns.

## 2016-12-03 ENCOUNTER — Telehealth: Payer: Self-pay | Admitting: Oncology

## 2016-12-03 NOTE — Telephone Encounter (Signed)
Dr Woodfin Ganja on [facility] 12/11/16. Rschd and conf with pt. MF

## 2016-12-11 ENCOUNTER — Ambulatory Visit: Payer: Medicaid Other | Admitting: Oncology

## 2016-12-13 ENCOUNTER — Ambulatory Visit: Payer: Medicaid Other | Admitting: Gastroenterology

## 2016-12-18 ENCOUNTER — Encounter: Payer: Self-pay | Admitting: Gastroenterology

## 2016-12-18 ENCOUNTER — Encounter (INDEPENDENT_AMBULATORY_CARE_PROVIDER_SITE_OTHER): Payer: Self-pay

## 2016-12-18 ENCOUNTER — Other Ambulatory Visit: Payer: Self-pay

## 2016-12-18 ENCOUNTER — Ambulatory Visit (INDEPENDENT_AMBULATORY_CARE_PROVIDER_SITE_OTHER): Payer: Medicaid Other | Admitting: Gastroenterology

## 2016-12-18 VITALS — BP 138/83 | HR 75 | Temp 98.7°F | Ht 69.0 in | Wt 353.0 lb

## 2016-12-18 DIAGNOSIS — K219 Gastro-esophageal reflux disease without esophagitis: Secondary | ICD-10-CM

## 2016-12-18 DIAGNOSIS — R12 Heartburn: Secondary | ICD-10-CM

## 2016-12-18 DIAGNOSIS — D509 Iron deficiency anemia, unspecified: Secondary | ICD-10-CM | POA: Diagnosis not present

## 2016-12-18 DIAGNOSIS — Z1211 Encounter for screening for malignant neoplasm of colon: Secondary | ICD-10-CM

## 2016-12-18 MED ORDER — OMEPRAZOLE 40 MG PO CPDR: 40.0000 mg | DELAYED_RELEASE_CAPSULE | Freq: Every day | ORAL | 1 refills | Status: DC

## 2016-12-18 NOTE — Progress Notes (Signed)
Michelle Darby, MD [address]  [place]  [place], [place] [address]  Main: [phone]  Fax: [phone]    Gastroenterology Consultation  Referring Provider:     Florene Glen, MD Primary Care Physician:  Donnie Coffin, MD Primary Gastroenterologist:  Dr. Cephas Mcmillan Reason for Consultation:     Heartburn        HPI:   Michelle Mcmillan is a 46 y.o. y/o female referred by Dr. Donnie Coffin, MD  for consultation & management of Chronic heartburn. She has a history of Breast Ca diagnosed in 05/2014 s/p lumpectomy in 05/2014 and XRT in 07/2014, currently in remission Heart burn started 93months ago, once a week, tums help Also, with nocturnal heart burn, regurgitation of acid, feels like choking Denies dysphagia, abdominal pain, nausea, weight loss She is morbidly obese She denies NSAID use, currently not on PPI or H2 blocker She denies weight loss, loss of appetite, smoking or alcohol She does report having chronic anemia and does not know if she has iron deficiency Reports that her second day of menstrual cycle is generally heavy She denies having any lower GI symptoms GI Procedures: none Denies any family history of GI malignancy  Past Medical History:  Diagnosis Date  . Anemia    H/O  . Arthritis    RIGHT KNEE  . Breast cancer ([place]) 2017   right breast  . Cancer ([place])    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)  . GERD (gastroesophageal reflux disease)    NO MEDS  . Headache    migraines  . Hypertension   . Last menstrual period (LMP) > 10 days ago 08/2015    Past Surgical History:  Procedure Laterality Date  . BREAST EXCISIONAL BIOPSY Right 06/15/15   breast cancer  . BREAST LUMPECTOMY WITH NEEDLE LOCALIZATION Right 06/15/2015   Procedure: BREAST LUMPECTOMY WITH NEEDLE LOCALIZATION;  Surgeon: Hubbard Robinson, MD;  Location: [facility];  Service: General;  Laterality: Right;  . CESAREAN SECTION  1988   x1  . MASS EXCISION N/A 05/20/2016   Procedure: EXCISION  CHEST WALL MASS;  Surgeon: Florene Glen, MD;  Location: [facility];  Service: General;  Laterality: N/A;  . SENTINEL NODE BIOPSY Right 06/15/2015   Procedure: SENTINEL NODE BIOPSY;  Surgeon: Hubbard Robinson, MD;  Location: [facility];  Service: General;  Laterality: Right;  . TUBAL LIGATION  2004    Prior to Admission medications   Medication Sig Start Date End Date Taking? Authorizing Provider  aspirin-acetaminophen-caffeine (EXCEDRIN MIGRAINE) [phone] MG tablet Take 2 tablets by mouth daily as needed for headache or migraine.    Yes [provider]  calcium-vitamin D (OSCAL WITH D) 500-200 MG-UNIT tablet Take 1 tablet by mouth 2 (two) times daily.   Yes [provider]  exemestane (AROMASIN) 25 MG tablet Take 1 tablet (25 mg total) by mouth daily after breakfast. 07/02/16  Yes Finnegan, Kathlene November, MD  hydrochlorothiazide (HYDRODIURIL) 25 MG tablet Take 25 mg by mouth daily.   Yes [provider]  ibuprofen (ADVIL,MOTRIN) 800 MG tablet Take 800 mg by mouth every 8 (eight) hours as needed for moderate pain.    Yes [provider]  potassium chloride (K-DUR) 10 MEQ tablet Take 10 mEq by mouth daily.   Yes [provider]  Prenatal Vit-Fe Fumarate-FA (PRENATAL VITAMIN PO) Take 1 tablet by mouth daily.   Yes [provider]  tetrahydrozoline 0.05 % ophthalmic solution Place 2 drops into both  eyes daily.   Yes [provider]    Family History  Problem Relation Age of Onset  . Hypertension Mother   . Hypertension Father   . Hypertension Sister   . Breast cancer Neg Hx      Social History  Substance Use Topics  . Smoking status: Never Smoker  . Smokeless tobacco: Never Used  . Alcohol use Yes     Comment: WINE ONCE MONTH    Allergies as of 12/18/2016  . (No Known Allergies)    Review of Systems:    All systems reviewed and negative except where noted in HPI.   Physical Exam:  BP 138/83   Pulse 75   Temp 98.7 F  (37.1 C) (Oral)   Ht 5\' 9"  (1.753 m)   Wt (!) 353 lb (160.1 kg)   LMP 12/05/2016   BMI 52.13 kg/m  Patient's last menstrual period was 12/05/2016.  General:   Alert,  Well-developed, well-nourished, pleasant and cooperative in NAD, morbidly obese Head:  Normocephalic and atraumatic. Eyes:  Sclera clear, no icterus.   Conjunctiva pink. Ears:  Normal auditory acuity. Nose:  No deformity, discharge, or lesions. Mouth:  No deformity or lesions,oropharynx pink & moist. Neck:  Supple; no masses or thyromegaly. Lungs:  Respirations even and unlabored.  Clear throughout to auscultation.   No wheezes, crackles, or rhonchi. No acute distress. Heart:  Regular rate and rhythm; no murmurs, clicks, rubs, or gallops. Abdomen:  Normal bowel sounds.  No bruits.  Soft, non-tender and non-distended without masses, hepatosplenomegaly or hernias noted.  No guarding or rebound tenderness.   Rectal: Nor performed Msk:  Symmetrical without gross deformities. Good, equal movement & strength bilaterally. Pulses:  Normal pulses noted. Extremities:  No clubbing or edema.  No cyanosis. Neurologic:  Alert and oriented x3;  grossly normal neurologically. Skin:  Intact without significant lesions or rashes. No jaundice. Lymph Nodes:  No significant cervical adenopathy. Psych:  Alert and cooperative. Normal mood and affect.  Imaging Studies: No Abdominal imaging  Assessment and Plan:   Michelle Mcmillan is a 46 y.o. y/oBlack female with morbid obesity, breast cancer status post lumpectomy and XRT currently in remission with chronic intermittent heartburn. Her symptoms are suggestive of typical GERD. Given her age, chronicity of symptoms and BMI, I recommend EGD for further evaluation. I will also start her on Prilosec 40 mg 2 times daily. I also discussed about antireflux measures, healthy lifestyle including dietary and exercise to lose weight. She does have chronic microcytic anemia, I will order iron studies She is  overdue for colon cancer screening based on her ethnicity. I recommend colonoscopy  I have discussed alternative options, risks & benefits,  which include, but are not limited to, bleeding, infection, perforation,respiratory complication & drug reaction.  The patient agrees with this plan & written consent will be obtained.    Follow up in 3 months   Michelle Darby, MD

## 2016-12-19 ENCOUNTER — Other Ambulatory Visit
Admission: RE | Admit: 2016-12-19 | Discharge: 2016-12-19 | Disposition: A | Discharge status: Home / Self Care | Payer: Medicaid Other | Source: Ambulatory Visit | Attending: Gastroenterology | Admitting: Gastroenterology

## 2016-12-19 DIAGNOSIS — D509 Iron deficiency anemia, unspecified: Secondary | ICD-10-CM | POA: Insufficient documentation

## 2016-12-19 LAB — COMPREHENSIVE METABOLIC PANEL
ALBUMIN: 3.3 g/dL — AB (ref 3.5–5.0)
ALK PHOS: 79 U/L (ref 38–126)
ALT: 21 U/L (ref 14–54)
ANION GAP: 10 (ref 5–15)
AST: 23 U/L (ref 15–41)
BILIRUBIN TOTAL: 0.5 mg/dL (ref 0.3–1.2)
BUN: 14 mg/dL (ref 6–20)
CALCIUM: 8.6 mg/dL — AB (ref 8.9–10.3)
CO2: 28 mmol/L (ref 22–32)
Chloride: 100 mmol/L — ABNORMAL LOW (ref 101–111)
Creatinine, Ser: 1.08 mg/dL — ABNORMAL HIGH (ref 0.44–1.00)
GFR calc Af Amer: >60 mL/min (ref >60)
GLUCOSE: 87 mg/dL (ref 65–99)
Potassium: 3.3 mmol/L — ABNORMAL LOW (ref 3.5–5.1)
Sodium: 138 mmol/L (ref 135–145)
TOTAL PROTEIN: 6.8 g/dL (ref 6.5–8.1)

## 2016-12-19 LAB — PROTIME-INR
INR: 0.93
PROTHROMBIN TIME: 12.4 s (ref 11.4–15.2)

## 2016-12-19 LAB — CBC
HEMATOCRIT: 35.2 % (ref 35.0–47.0)
HEMOGLOBIN: 11.8 g/dL — AB (ref 12.0–16.0)
MCH: 25.9 pg — ABNORMAL LOW (ref 26.0–34.0)
MCHC: 33.6 g/dL (ref 32.0–36.0)
MCV: 77.2 fL — ABNORMAL LOW (ref 80.0–100.0)
Platelets: 234 10*3/uL (ref 150–440)
RBC: 4.56 MIL/uL (ref 3.80–5.20)
RDW: 18.6 % — AB (ref 11.5–14.5)
WBC: 6.4 10*3/uL (ref 3.6–11.0)

## 2016-12-19 LAB — IRON AND TIBC
IRON: 40 ug/dL (ref 28–170)
Saturation Ratios: 13 % (ref 10.4–31.8)
TIBC: 311 ug/dL (ref 250–450)
UIBC: 271 ug/dL

## 2016-12-19 LAB — FERRITIN: Ferritin: 29 ng/mL (ref 11–307)

## 2016-12-20 ENCOUNTER — Telehealth: Payer: Self-pay

## 2016-12-20 MED ORDER — IRON 325 (65 FE) MG PO TABS: 1.0000 | ORAL_TABLET | Freq: Two times a day (BID) | ORAL | 2 refills | Status: DC

## 2016-12-20 NOTE — Telephone Encounter (Signed)
Pt has been informed  that her iron stores are low, recommend oral iron 325 mg 1-2 tablets daily for 3-6 months. Thanks [employer]

## 2016-12-24 NOTE — Progress Notes (Signed)
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   INTERVAL HISTORY: Patient returns to clinic today for routine 6 month evaluation. Patient is tolerating Aromasin well without significant side effects. She has occasional hot flashes that do not affect her day-to-day activity. She does not feel well today with increased weakness and generalized body aches. She has no neurologic complaints. She denies any recent fevers or illnesses. She has a good appetite and denies weight loss. She denies any pain. She has no chest pain or shortness of breath. She denies any nausea, vomiting, constipation, or diarrhea. She has no urinary complaints. Patient offers no further specific complaints today.  REVIEW OF SYSTEMS:   Review of Systems  Constitutional: Negative for fever, malaise/fatigue and weight loss.  Respiratory: Negative.  Negative for cough and shortness of breath.   Cardiovascular: Negative.  Negative for chest pain and leg swelling.  Gastrointestinal: Negative.  Negative for abdominal pain.  Genitourinary: Negative.   Musculoskeletal: Positive for myalgias.  Neurological: Positive for sensory change. Negative for weakness.  Psychiatric/Behavioral: Negative.  The patient is not nervous/anxious.     As per HPI. Otherwise, a complete review of systems is negative.  PAST MEDICAL HISTORY: Past Medical History:  Diagnosis Date  . Anemia    H/O  . Arthritis    RIGHT KNEE  . Breast cancer ([place]) 2017   right breast  . Cancer ([place])    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)  . GERD (gastroesophageal reflux disease)    NO MEDS    . Headache    migraines  . Hypertension   . Last menstrual period (LMP) > 10 days ago 08/2015    PAST SURGICAL HISTORY: Past Surgical History:  Procedure Laterality Date  . BREAST EXCISIONAL BIOPSY Right 06/15/15   breast cancer  . BREAST LUMPECTOMY WITH NEEDLE LOCALIZATION Right 06/15/2015   Procedure: BREAST LUMPECTOMY WITH NEEDLE LOCALIZATION;  Surgeon: Hubbard Robinson, MD;  Location: [facility];  Service: General;  Laterality: Right;  . CESAREAN SECTION  1988   x1  . MASS EXCISION N/A 05/20/2016   Procedure: EXCISION CHEST WALL MASS;  Surgeon: Florene Glen, MD;  Location: [facility];  Service: General;  Laterality: N/A;  . SENTINEL NODE BIOPSY Right 06/15/2015   Procedure: SENTINEL NODE BIOPSY;  Surgeon: Hubbard Robinson, MD;  Location: [facility];  Service: General;  Laterality: Right;  . TUBAL LIGATION  2004    FAMILY HISTORY Family History  Problem Relation Age of Onset  . Hypertension Mother   . Hypertension Father   . Hypertension Sister   . Breast cancer Neg Hx        ADVANCED DIRECTIVES:    HEALTH MAINTENANCE: Social History  Substance Use Topics  . Smoking status: Never Smoker  . Smokeless tobacco: Never Used  . Alcohol use Yes     Comment: WINE ONCE MONTH    No Known Allergies  Current Outpatient Prescriptions  Medication Sig Dispense Refill  . aspirin-acetaminophen-caffeine (EXCEDRIN MIGRAINE) 250-250-65 MG tablet Take 2 tablets by mouth daily as needed for headache or migraine.     . calcium-vitamin D (OSCAL WITH D) 500-200 MG-UNIT tablet Take 1  tablet by mouth 2 (two) times daily.    Marland Kitchen exemestane (AROMASIN) 25 MG tablet Take 1 tablet (25 mg total) by mouth daily after breakfast. 30 tablet 6  . Ferrous Sulfate (IRON) 325 (65 Fe) MG TABS Take 1 tablet (325 mg total) by mouth 2 (two) times daily. 60 each 2  . hydrochlorothiazide (HYDRODIURIL) 25 MG tablet Take 25 mg by mouth daily.    Marland Kitchen ibuprofen (ADVIL,MOTRIN) 800 MG tablet Take 800 mg by mouth  every 8 (eight) hours as needed for moderate pain.     Marland Kitchen omeprazole (PRILOSEC) 40 MG capsule Take 1 capsule (40 mg total) by mouth daily. 30 capsule 1  . potassium chloride (K-DUR) 10 MEQ tablet Take 10 mEq by mouth daily.    . Prenatal Vit-Fe Fumarate-FA (PRENATAL VITAMIN PO) Take 1 tablet by mouth daily.    Marland Kitchen tetrahydrozoline 0.05 % ophthalmic solution Place 2 drops into both eyes daily.     No current facility-administered medications for this visit.     OBJECTIVE: Vitals:   12/25/16 1118  BP: 139/90  Pulse: 62  Resp: 18  Temp: 97.7 F (36.5 C)     Body mass index is 52.16 kg/m.    ECOG FS:0 - Asymptomatic  General: Well-developed, well-nourished, no acute distress. Eyes: Pink conjunctiva, anicteric sclera. Breasts: Bilateral breasts and axilla without lumps or masses.  Lungs: Clear to auscultation bilaterally. Heart: Regular rate and rhythm. No rubs, murmurs, or gallops. Abdomen: Soft, nontender, nondistended. No organomegaly noted, normoactive bowel sounds. Musculoskeletal: No edema, cyanosis, or clubbing. Neuro: Alert, answering all questions appropriately. Cranial nerves grossly intact. Skin: No rashes or petechiae noted. Psych: Normal affect.   LAB RESULTS:  Lab Results  Component Value Date   NA 138 12/19/2016   K 3.3 (L) 12/19/2016   CL 100 (L) 12/19/2016   CO2 28 12/19/2016   GLUCOSE 87 12/19/2016   BUN 14 12/19/2016   CREATININE 1.08 (H) 12/19/2016   CALCIUM 8.6 (L) 12/19/2016   PROT 6.8 12/19/2016   ALBUMIN 3.3 (L) 12/19/2016   AST 23 12/19/2016   ALT 21 12/19/2016   ALKPHOS 79 12/19/2016   BILITOT 0.5 12/19/2016   GFRNONAA >60 12/19/2016   GFRAA >60 12/19/2016    Lab Results  Component Value Date   WBC 6.4 12/19/2016   NEUTROABS 4.5 06/12/2016   HGB 11.8 (L) 12/19/2016   HCT 35.2 12/19/2016   MCV 77.2 (L) 12/19/2016   PLT 234 12/19/2016     STUDIES: No results found.  ASSESSMENT:  Pathologic stage Ia ER/PR positive, HER-2 negative  adenocarcinoma of the upper outer quadrant of the right breast. BRCA 1 and 2 negative, Oncotype DX score 17 which is considered low risk.  PLAN:    1. 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 Patient completed adjuvant XRT on September 19, 2015. Because of her possible side effects of hair loss and lethargy with tamoxifen, patient was switched to Aromasin. Continue Aromasin for 5 years completing in June 2022.  Patient's most recent mammogram on May 03, 2016 was reported as BI-RADS 2, repeat in February 2019. Return to clinic in 6 months for further evaluation. 2. Genetic testing: Patient was found to be BRCA 1 and 2 negative. She expressed understanding that although her genetic testing was negative, her children are at higher risk for breast  cancer than the general population.  3. Bone mineral density: Bone density from September 04, 2016 reported Z score of 1.0 which is considered normal. Repeat in June 2020. 4. Weakness/myalgias: Possibly viral syndrome, monitor.   Patient expressed understanding and was in agreement with this plan. She also understands that She can call clinic at any time with any questions, concerns, or complaints.    Lloyd Huger, MD 12/29/16 8:02 AM

## 2016-12-25 ENCOUNTER — Inpatient Hospital Stay: Payer: Medicaid Other | Attending: Oncology | Admitting: Oncology

## 2016-12-25 VITALS — BP 139/90 | HR 62 | Temp 97.7°F | Resp 18 | Wt 353.2 lb

## 2016-12-25 DIAGNOSIS — I10 Essential (primary) hypertension: Secondary | ICD-10-CM | POA: Diagnosis not present

## 2016-12-25 DIAGNOSIS — Z7982 Long term (current) use of aspirin: Secondary | ICD-10-CM | POA: Diagnosis not present

## 2016-12-25 DIAGNOSIS — R531 Weakness: Secondary | ICD-10-CM | POA: Diagnosis not present

## 2016-12-25 DIAGNOSIS — Z170 Estrogen receptor positive status [ER+]: Secondary | ICD-10-CM | POA: Diagnosis not present

## 2016-12-25 DIAGNOSIS — Z79811 Long term (current) use of aromatase inhibitors: Secondary | ICD-10-CM | POA: Diagnosis not present

## 2016-12-25 DIAGNOSIS — M791 Myalgia: Secondary | ICD-10-CM | POA: Diagnosis not present

## 2016-12-25 DIAGNOSIS — Z923 Personal history of irradiation: Secondary | ICD-10-CM | POA: Diagnosis not present

## 2016-12-25 DIAGNOSIS — M1990 Unspecified osteoarthritis, unspecified site: Secondary | ICD-10-CM | POA: Diagnosis not present

## 2016-12-25 DIAGNOSIS — C50411 Malignant neoplasm of upper-outer quadrant of right female breast: Secondary | ICD-10-CM | POA: Diagnosis not present

## 2016-12-25 DIAGNOSIS — R232 Flushing: Secondary | ICD-10-CM | POA: Diagnosis not present

## 2016-12-25 DIAGNOSIS — K219 Gastro-esophageal reflux disease without esophagitis: Secondary | ICD-10-CM | POA: Diagnosis not present

## 2016-12-25 DIAGNOSIS — Z79899 Other long term (current) drug therapy: Secondary | ICD-10-CM | POA: Insufficient documentation

## 2016-12-25 NOTE — Progress Notes (Signed)
Patient reports she does not feel well today. Patient reports fatigue and generalized body aches today.

## 2017-01-01 ENCOUNTER — Telehealth: Payer: Self-pay | Admitting: Gastroenterology

## 2017-01-01 NOTE — Telephone Encounter (Signed)
Left voice message for patient to call and reschedule her appt on 12/11 to the morning or another day.

## 2017-01-14 ENCOUNTER — Other Ambulatory Visit: Payer: Self-pay | Admitting: *Deleted

## 2017-01-14 MED ORDER — EXEMESTANE 25 MG PO TABS: 25.0000 mg | ORAL_TABLET | Freq: Every day | ORAL | 6 refills | Status: DC

## 2017-01-23 ENCOUNTER — Encounter
Admission: RE | Disposition: A | Discharge status: Home / Self Care | Payer: Self-pay | Source: Ambulatory Visit | Attending: Gastroenterology

## 2017-01-23 ENCOUNTER — Ambulatory Visit: Payer: Medicaid Other | Admitting: Anesthesiology

## 2017-01-23 ENCOUNTER — Ambulatory Visit
Admission: RE | Admit: 2017-01-23 | Discharge: 2017-01-23 | Disposition: A | Discharge status: Home / Self Care | Payer: Medicaid Other | Source: Ambulatory Visit | Attending: Gastroenterology | Admitting: Gastroenterology

## 2017-01-23 ENCOUNTER — Encounter: Payer: Self-pay | Admitting: *Deleted

## 2017-01-23 DIAGNOSIS — M1990 Unspecified osteoarthritis, unspecified site: Secondary | ICD-10-CM | POA: Diagnosis not present

## 2017-01-23 DIAGNOSIS — R12 Heartburn: Secondary | ICD-10-CM

## 2017-01-23 DIAGNOSIS — D649 Anemia, unspecified: Secondary | ICD-10-CM | POA: Diagnosis not present

## 2017-01-23 DIAGNOSIS — Z853 Personal history of malignant neoplasm of breast: Secondary | ICD-10-CM | POA: Diagnosis not present

## 2017-01-23 DIAGNOSIS — Z7982 Long term (current) use of aspirin: Secondary | ICD-10-CM | POA: Diagnosis not present

## 2017-01-23 DIAGNOSIS — K219 Gastro-esophageal reflux disease without esophagitis: Secondary | ICD-10-CM | POA: Diagnosis not present

## 2017-01-23 DIAGNOSIS — Z79899 Other long term (current) drug therapy: Secondary | ICD-10-CM | POA: Diagnosis not present

## 2017-01-23 DIAGNOSIS — Z923 Personal history of irradiation: Secondary | ICD-10-CM | POA: Diagnosis not present

## 2017-01-23 DIAGNOSIS — Z1211 Encounter for screening for malignant neoplasm of colon: Secondary | ICD-10-CM | POA: Insufficient documentation

## 2017-01-23 DIAGNOSIS — I10 Essential (primary) hypertension: Secondary | ICD-10-CM | POA: Insufficient documentation

## 2017-01-23 DIAGNOSIS — Q438 Other specified congenital malformations of intestine: Secondary | ICD-10-CM | POA: Insufficient documentation

## 2017-01-23 DIAGNOSIS — R51 Headache: Secondary | ICD-10-CM | POA: Insufficient documentation

## 2017-01-23 DIAGNOSIS — K644 Residual hemorrhoidal skin tags: Secondary | ICD-10-CM | POA: Insufficient documentation

## 2017-01-23 HISTORY — PX: ESOPHAGOGASTRODUODENOSCOPY (EGD) WITH PROPOFOL: SHX5813

## 2017-01-23 HISTORY — PX: COLONOSCOPY WITH PROPOFOL: SHX5780

## 2017-01-23 LAB — POCT PREGNANCY, URINE: PREG TEST UR: NEGATIVE

## 2017-01-23 SURGERY — COLONOSCOPY WITH PROPOFOL
Anesthesia: General

## 2017-01-23 MED ORDER — HYDRALAZINE HCL 20 MG/ML IJ SOLN
10.0000 mg | Freq: Once | INTRAMUSCULAR | Status: AC
  Administered 2017-01-23: 10 mg via INTRAVENOUS

## 2017-01-23 MED ORDER — PROPOFOL 500 MG/50ML IV EMUL
INTRAVENOUS | Status: AC
Start: 2017-01-23 — End: ?
  Filled 2017-01-23: qty 50

## 2017-01-23 MED ORDER — BUTAMBEN-TETRACAINE-BENZOCAINE 2-2-14 % EX AERO
INHALATION_SPRAY | CUTANEOUS | Status: AC
  Filled 2017-01-23: qty 5

## 2017-01-23 MED ORDER — PROPOFOL 500 MG/50ML IV EMUL
INTRAVENOUS | Status: DC | PRN
  Administered 2017-01-23: 140 ug/kg/min via INTRAVENOUS

## 2017-01-23 MED ORDER — HYDRALAZINE HCL 20 MG/ML IJ SOLN
INTRAMUSCULAR | Status: AC
  Filled 2017-01-23: qty 1

## 2017-01-23 MED ORDER — FENTANYL CITRATE (PF) 100 MCG/2ML IJ SOLN
INTRAMUSCULAR | Status: DC | PRN
  Administered 2017-01-23: 25 ug via INTRAVENOUS
  Administered 2017-01-23: 50 ug via INTRAVENOUS
  Administered 2017-01-23: 25 ug via INTRAVENOUS

## 2017-01-23 MED ORDER — MIDAZOLAM HCL 2 MG/2ML IJ SOLN
INTRAMUSCULAR | Status: DC | PRN
  Administered 2017-01-23: 2 mg via INTRAVENOUS

## 2017-01-23 MED ORDER — SODIUM CHLORIDE 0.9 % IV SOLN
INTRAVENOUS | Status: DC
  Administered 2017-01-23: 1000 mL via INTRAVENOUS

## 2017-01-23 MED ORDER — LIDOCAINE HCL (PF) 2 % IJ SOLN
INTRAMUSCULAR | Status: AC
  Filled 2017-01-23: qty 10

## 2017-01-23 MED ORDER — BUTAMBEN-TETRACAINE-BENZOCAINE 2-2-14 % EX AERO
INHALATION_SPRAY | CUTANEOUS | Status: DC | PRN
  Administered 2017-01-23: 2 via TOPICAL

## 2017-01-23 MED ORDER — MIDAZOLAM HCL 2 MG/2ML IJ SOLN
INTRAMUSCULAR | Status: AC
  Filled 2017-01-23: qty 2

## 2017-01-23 MED ORDER — FENTANYL CITRATE (PF) 100 MCG/2ML IJ SOLN
INTRAMUSCULAR | Status: AC
  Filled 2017-01-23: qty 2

## 2017-01-23 MED ORDER — PROPOFOL 500 MG/50ML IV EMUL
INTRAVENOUS | Status: AC
  Filled 2017-01-23: qty 50

## 2017-01-23 NOTE — Anesthesia Preprocedure Evaluation (Signed)
Anesthesia Evaluation  Patient identified by MRN, date of birth, ID band Patient awake    Reviewed: Allergy & Precautions, NPO status , Patient's Chart, lab work & pertinent test results  History of Anesthesia Complications Negative for: history of anesthetic complications  Airway Mallampati: II  TM Distance: >3 FB Neck ROM: Full    Dental no notable dental hx.    Pulmonary neg pulmonary ROS, neg sleep apnea, neg COPD,    breath sounds clear to auscultation- rhonchi (-) wheezing      Cardiovascular hypertension, Pt. on medications (-) CAD, (-) Past MI and (-) Cardiac Stents  Rhythm:Regular Rate:Normal - Systolic murmurs and - Diastolic murmurs    Neuro/Psych  Headaches, negative psych ROS   GI/Hepatic Neg liver ROS, GERD  ,  Endo/Other  negative endocrine ROSneg diabetes  Renal/GU negative Renal ROS     Musculoskeletal  (+) Arthritis ,   Abdominal (+) + obese,   Peds  Hematology  (+) anemia ,   Anesthesia Other Findings Past Medical History: No date: Anemia     Comment:  H/O No date: Arthritis     Comment:  RIGHT KNEE 2017: Breast cancer ([place])     Comment:  right breast No date: Cancer [facility])     Comment:  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) No date: GERD (gastroesophageal reflux disease)     Comment:  NO MEDS No date: Headache     Comment:  migraines No date: Hypertension 08/2015: Last menstrual period (LMP) > 10 days ago   Reproductive/Obstetrics                             Anesthesia Physical Anesthesia Plan  ASA: II  Anesthesia Plan: General   Post-op Pain Management:    Induction: Intravenous  PONV Risk Score and Plan: 2 and Propofol infusion  Airway Management Planned: Natural Airway  Additional Equipment:   Intra-op Plan:   Post-operative Plan:   Informed Consent: I have reviewed the patients History and Physical, chart, labs and discussed the procedure including the  risks, benefits and alternatives for the proposed anesthesia with the patient or authorized representative who has indicated his/her understanding and acceptance.   Dental advisory given  Plan Discussed with: CRNA and Anesthesiologist  Anesthesia Plan Comments:         Anesthesia Quick Evaluation

## 2017-01-23 NOTE — Transfer of Care (Signed)
Immediate Anesthesia Transfer of Care Note  Patient: Michelle Mcmillan  Procedure(s) Performed: COLONOSCOPY WITH PROPOFOL (N/A ) ESOPHAGOGASTRODUODENOSCOPY (EGD) WITH PROPOFOL (N/A )  Patient Location: PACU  Anesthesia Type:General  Level of Consciousness: awake and sedated  Airway & Oxygen Therapy: Patient Spontanous Breathing and Patient connected to nasal cannula oxygen  Post-op Assessment: Report given to RN and Post -op Vital signs reviewed and stable  Post vital signs: Reviewed and stable  Last Vitals:  Vitals:   01/23/17 1244  BP: 138/84  Pulse: 84  Resp: 20  Temp: (!) 36.3 C  SpO2: 99%    Last Pain:  Vitals:   01/23/17 1244  TempSrc: Tympanic  PainSc: 5       Patients Stated Pain Goal: 0 (70/78/67 5449)  Complications: No apparent anesthesia complications

## 2017-01-23 NOTE — Anesthesia Procedure Notes (Signed)
Performed by: COOK-MARTIN, Yarnell Kozloski Pre-anesthesia Checklist: Patient identified, Emergency Drugs available, Suction available, Patient being monitored and Timeout performed Patient Re-evaluated:Patient Re-evaluated prior to induction Oxygen Delivery Method: Nasal cannula Preoxygenation: Pre-oxygenation with 100% oxygen Induction Type: IV induction Airway Equipment and Method: Bite block Placement Confirmation: positive ETCO2 and CO2 detector       

## 2017-01-23 NOTE — H&P (Signed)
Cephas Darby, MD [address]  [place]  [place], [place] [address]  Main: [phone]  Fax: [phone] Pager: [phone]  Primary Care Physician:  Donnie Coffin, MD Primary Gastroenterologist:  Dr. Cephas Darby  Pre-Procedure History & Physical: HPI:  Michelle Mcmillan is a 46 y.o. female is here for an endoscopy and colonoscopy.   Past Medical History:  Diagnosis Date  . Anemia    H/O  . Arthritis    RIGHT KNEE  . Breast cancer ([place]) 2017   right breast  . Cancer ([place])    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)  . GERD (gastroesophageal reflux disease)    NO MEDS  . Headache    migraines  . Hypertension   . Last menstrual period (LMP) > 10 days ago 08/2015    Past Surgical History:  Procedure Laterality Date  . BREAST EXCISIONAL BIOPSY Right 06/15/15   breast cancer  . BREAST LUMPECTOMY WITH NEEDLE LOCALIZATION Right 06/15/2015   Procedure: BREAST LUMPECTOMY WITH NEEDLE LOCALIZATION;  Surgeon: Hubbard Robinson, MD;  Location: [facility];  Service: General;  Laterality: Right;  . BREAST SURGERY    . CESAREAN SECTION  1988   x1  . MASS EXCISION N/A 05/20/2016   Procedure: EXCISION CHEST WALL MASS;  Surgeon: Florene Glen, MD;  Location: [facility];  Service: General;  Laterality: N/A;  . SENTINEL NODE BIOPSY Right 06/15/2015   Procedure: SENTINEL NODE BIOPSY;  Surgeon: Hubbard Robinson, MD;  Location: [facility];  Service: General;  Laterality: Right;  . TUBAL LIGATION  2004    Prior to Admission medications   Medication Sig Start Date End Date Taking? Authorizing Provider  aspirin-acetaminophen-caffeine (EXCEDRIN MIGRAINE) [phone] MG tablet Take 2 tablets by mouth daily as needed for headache or migraine.    Yes [provider]  calcium-vitamin D (OSCAL WITH D) 500-200 MG-UNIT tablet Take 1 tablet by mouth 2 (two) times daily.   Yes [provider]  exemestane (AROMASIN) 25 MG tablet Take 1 tablet (25 mg total) by mouth daily after breakfast.  01/14/17  Yes Finnegan, Kathlene November, MD  Ferrous Sulfate (IRON) 325 (65 Fe) MG TABS Take 1 tablet (325 mg total) by mouth 2 (two) times daily. 12/20/16  Yes Vanga, Tally Due, MD  hydrochlorothiazide (HYDRODIURIL) 25 MG tablet Take 25 mg by mouth daily.   Yes [provider]  ibuprofen (ADVIL,MOTRIN) 800 MG tablet Take 800 mg by mouth every 8 (eight) hours as needed for moderate pain.    Yes [provider]  potassium chloride (K-DUR) 10 MEQ tablet Take 10 mEq by mouth daily.   Yes [provider]  Prenatal Vit-Fe Fumarate-FA (PRENATAL VITAMIN PO) Take 1 tablet by mouth daily.   Yes [provider]  tetrahydrozoline 0.05 % ophthalmic solution Place 2 drops into both eyes daily.   Yes [provider]  omeprazole (PRILOSEC) 40 MG capsule Take 1 capsule (40 mg total) by mouth daily. 12/18/16 01/17/17  Lin Landsman, MD    Allergies as of 12/18/2016  . (No Known Allergies)    Family History  Problem Relation Age of Onset  . Hypertension Mother   . Hypertension Father   . Hypertension Sister   . Breast cancer Neg Hx     Social History   Social History  . Marital status: Married    Spouse name: N/A  . Number of children: N/A  . Years of education: N/A   Occupational History  . Not on file.  Social History Main Topics  . Smoking status: Never Smoker  . Smokeless tobacco: Never Used  . Alcohol use Yes     Comment: WINE ONCE MONTH  . Drug use: No  . Sexual activity: Not on file   Other Topics Concern  . Not on file   Social History Narrative  . No narrative on file    Review of Systems: See HPI, otherwise negative ROS  Physical Exam: BP 138/84   Pulse 84   Temp (!) 97.4 F (36.3 C) (Tympanic)   Resp 20   Ht 5\' 9"  (1.753 m)   Wt (!) 353 lb (160.1 kg)   LMP 01/20/2017 (Exact Date)   SpO2 99%   BMI 52.13 kg/m  General:   Alert,  pleasant and cooperative in NAD Head:  Normocephalic and atraumatic. Neck:  Supple; no  masses or thyromegaly. Lungs:  Clear throughout to auscultation.    Heart:  Regular rate and rhythm. Abdomen:  Soft, nontender and nondistended. Normal bowel sounds, without guarding, and without rebound.   Neurologic:  Alert and  oriented x4;  grossly normal neurologically.  Impression/Plan: Michelle Mcmillan is here for an endoscopy and colonoscopy to be performed for GERD, colon cancer screening  Risks, benefits, limitations, and alternatives regarding  endoscopy and colonoscopy have been reviewed with the patient.  Questions have been answered.  All parties agreeable.   Sherri Sear, MD  01/23/2017, 1:18 PM

## 2017-01-23 NOTE — Op Note (Signed)
[facility] Gastroenterology Patient Name: Michelle Mcmillan Procedure Date: 01/23/2017 1:23 PM MRN: [id number] Account #: [id number] Date of Birth: [birth year] Admit Type: Outpatient Age: 46 Room: [facility] ENDO ROOM 3 Gender: Female Note Status: Finalized Procedure:            Upper GI endoscopy Indications:          Follow-up of gastro-esophageal reflux disease Providers:            Lin Landsman MD, MD Medicines:            Monitored Anesthesia Care Complications:        No immediate complications. Estimated blood loss: None. Procedure:            Pre-Anesthesia Assessment:                       - Prior to the procedure, a History and Physical was                        performed, and patient medications and allergies were                        reviewed. The patient is competent. The risks and                        benefits of the procedure and the sedation options and                        risks were discussed with the patient. All questions                        were answered and informed consent was obtained.                        Patient identification and proposed procedure were                        verified by the physician, the nurse, the                        anesthesiologist, the anesthetist and the technician in                        the pre-procedure area in the procedure room. Mental                        Status Examination: alert and oriented. Airway                        Examination: normal oropharyngeal airway and neck                        mobility. Respiratory Examination: clear to                        auscultation. CV Examination: normal. Prophylactic                        Antibiotics: The patient does not require prophylactic  antibiotics. Prior Anticoagulants: The patient has                        taken no previous anticoagulant or antiplatelet agents.                        ASA Grade Assessment: III - A  patient with severe                        systemic disease. After reviewing the risks and                        benefits, the patient was deemed in satisfactory                        condition to undergo the procedure. The anesthesia plan                        was to use monitored anesthesia care (MAC). Immediately                        prior to administration of medications, the patient was                        re-assessed for adequacy to receive sedatives. The                        heart rate, respiratory rate, oxygen saturations, blood                        pressure, adequacy of pulmonary ventilation, and                        response to care were monitored throughout the                        procedure. The physical status of the patient was                        re-assessed after the procedure.                       After obtaining informed consent, the endoscope was                        passed under direct vision. Throughout the procedure,                        the patient's blood pressure, pulse, and oxygen                        saturations were monitored continuously. The Endoscope                        was introduced through the mouth, and advanced to the                        second part of duodenum. The upper GI endoscopy was  accomplished without difficulty. The patient tolerated                        the procedure well. Findings:      The first portion of the duodenum and second portion of the duodenum       were normal.      The entire examined stomach was normal.      The cardia and gastric fundus were normal on retroflexion.      The gastroesophageal junction and examined esophagus were normal. Impression:           - Normal first portion of the duodenum and second                        portion of the duodenum.                       - Normal stomach.                       - Normal gastroesophageal junction and esophagus.                        - No specimens collected. Recommendation:       - Use Prilosec (omeprazole) 40 mg PO daily for 2                        months, then switch to H2 blocker as needed.                       - Continue oral iron 2 times daily                       - Proceed with screening colonoscopy Procedure Code(s):    --- Professional ---                       [phone], Esophagogastroduodenoscopy, flexible, transoral;                        diagnostic, including collection of specimen(s) by                        brushing or washing, when performed (separate procedure) Diagnosis Code(s):    --- Professional ---                       K21.9, Gastro-esophageal reflux disease without                        esophagitis CPT copyright 2016 American Medical Association. All rights reserved. The codes documented in this report are preliminary and upon coder review may  be revised to meet current compliance requirements. Dr. Ulyess Mort Lin Landsman MD, MD 01/23/2017 2:27:54 PM This report has been signed electronically. Number of Addenda: 0 Note Initiated On: 01/23/2017 1:23 PM Total Procedure Duration: 0 hours 4 minutes 37 seconds  Estimated Blood Loss: Estimated blood loss: none.      [facility]

## 2017-01-23 NOTE — Op Note (Signed)
[facility] Gastroenterology Patient Name: Michelle Mcmillan Procedure Date: 01/23/2017 1:24 PM MRN: [id number] Account #: [id number] Date of Birth: 01-02-1971 Admit Type: Outpatient Age: 46 Room: [facility] ENDO ROOM 3 Gender: Female Note Status: Finalized Procedure:            Colonoscopy Indications:          Screening for colorectal malignant neoplasm Providers:            Lin Landsman MD, MD Medicines:            Monitored Anesthesia Care Complications:        No immediate complications. Procedure:            Pre-Anesthesia Assessment:                       - Prior to the procedure, a History and Physical was                        performed, and patient medications and allergies were                        reviewed. The patient is competent. The risks and                        benefits of the procedure and the sedation options and                        risks were discussed with the patient. All questions                        were answered and informed consent was obtained.                        Patient identification and proposed procedure were                        verified by the physician, the nurse, the                        anesthesiologist, the anesthetist and the technician in                        the pre-procedure area in the procedure room. Mental                        Status Examination: alert and oriented. Airway                        Examination: normal oropharyngeal airway and neck                        mobility. Respiratory Examination: clear to                        auscultation. CV Examination: normal. Prophylactic                        Antibiotics: The patient does not require prophylactic                        antibiotics. Prior Anticoagulants: The  patient has                        taken no previous anticoagulant or antiplatelet agents.                        ASA Grade Assessment: III - A patient with severe                         systemic disease. After reviewing the risks and                        benefits, the patient was deemed in satisfactory                        condition to undergo the procedure. The anesthesia plan                        was to use monitored anesthesia care (MAC). Immediately                        prior to administration of medications, the patient was                        re-assessed for adequacy to receive sedatives. The                        heart rate, respiratory rate, oxygen saturations, blood                        pressure, adequacy of pulmonary ventilation, and                        response to care were monitored throughout the                        procedure. The physical status of the patient was                        re-assessed after the procedure.                       After obtaining informed consent, the colonoscope was                        passed under direct vision. Throughout the procedure,                        the patient's blood pressure, pulse, and oxygen                        saturations were monitored continuously. The                        Colonoscope was introduced through the anus and                        advanced to the the cecum, identified by appendiceal                        orifice and ileocecal valve.  The colonoscopy was                        technically difficult and complex due to a tortuous                        colon and the patient's body habitus. Successful                        completion of the procedure was aided by applying                        abdominal pressure. The patient tolerated the procedure                        fairly well. The quality of the bowel preparation was                        evaluated using the BBPS [facility] Bowel Preparation                        Scale) with scores of: Right Colon = 3, Transverse                        Colon = 3 and Left Colon = 3 (entire mucosa seen well                         with no residual staining, small fragments of stool or                        opaque liquid). The total BBPS score equals 9. Findings:      The perianal exam findings include non-thrombosed external hemorrhoids.      The colon (entire examined portion) appeared normal.      External hemorrhoids were found during retroflexion. The hemorrhoids       were medium-sized.      No additional abnormalities were found on retroflexion. Impression:           - Non-thrombosed external hemorrhoids found on perianal                        exam.                       - The entire examined colon is normal.                       - External hemorrhoids.                       - No specimens collected. Recommendation:       - Discharge patient to home.                       - Resume previous diet today.                       - Continue present medications.                       - Repeat colonoscopy in 10 years for surveillance.                       -  Return to my office as previously scheduled. Procedure Code(s):    --- Professional ---                       A0045, Colorectal cancer screening; colonoscopy on                        individual not meeting criteria for high risk Diagnosis Code(s):    --- Professional ---                       Z12.11, Encounter for screening for malignant neoplasm                        of colon                       K64.4, Residual hemorrhoidal skin tags CPT copyright 2016 American Medical Association. All rights reserved. The codes documented in this report are preliminary and upon coder review may  be revised to meet current compliance requirements. Dr. Ulyess Mort Lin Landsman MD, MD 01/23/2017 2:31:24 PM This report has been signed electronically. Number of Addenda: 0 Note Initiated On: 01/23/2017 1:24 PM Scope Withdrawal Time: 0 hours 8 minutes 2 seconds  Total Procedure Duration: 0 hours 21 minutes 43 seconds       [facility]

## 2017-01-23 NOTE — Anesthesia Post-op Follow-up Note (Signed)
Anesthesia QCDR form completed.        

## 2017-01-24 ENCOUNTER — Encounter: Payer: Self-pay | Admitting: Gastroenterology

## 2017-01-28 NOTE — Anesthesia Postprocedure Evaluation (Signed)
Anesthesia Post Note  Patient: [name]  Procedure(s) Performed: COLONOSCOPY WITH PROPOFOL (N/A ) ESOPHAGOGASTRODUODENOSCOPY (EGD) WITH PROPOFOL (N/A )  Patient location during evaluation: Endoscopy Anesthesia Type: General Level of consciousness: awake and alert and oriented Pain management: pain level controlled Vital Signs Assessment: post-procedure vital signs reviewed and stable Respiratory status: spontaneous breathing, nonlabored ventilation and respiratory function stable Cardiovascular status: blood pressure returned to baseline and stable Postop Assessment: no signs of nausea or vomiting Anesthetic complications: no     Last Vitals:  Vitals:   01/23/17 1510 01/23/17 1520  BP: (!) 155/100 (!) 157/99  Pulse: 75 78  Resp: 14 19  Temp:    SpO2: 100% 100%    Last Pain:  Vitals:   01/24/17 0743  TempSrc:   PainSc: 0-No pain                 Naysa Puskas

## 2017-03-07 ENCOUNTER — Other Ambulatory Visit: Payer: Self-pay

## 2017-03-07 ENCOUNTER — Telehealth: Payer: Self-pay | Admitting: Gastroenterology

## 2017-03-07 MED ORDER — OMEPRAZOLE 40 MG PO CPDR: 40.0000 mg | DELAYED_RELEASE_CAPSULE | Freq: Every day | ORAL | 1 refills | Status: DC

## 2017-03-07 NOTE — Telephone Encounter (Signed)
Please call patient this morning. She will need medication called in before 1:00. I had to move her appt

## 2017-03-10 ENCOUNTER — Ambulatory Visit: Payer: Medicaid Other | Admitting: Gastroenterology

## 2017-03-11 ENCOUNTER — Ambulatory Visit: Payer: Medicaid Other | Admitting: Gastroenterology

## 2017-04-10 ENCOUNTER — Encounter: Payer: Self-pay | Admitting: *Deleted

## 2017-04-15 ENCOUNTER — Encounter (INDEPENDENT_AMBULATORY_CARE_PROVIDER_SITE_OTHER): Payer: Self-pay

## 2017-04-15 ENCOUNTER — Ambulatory Visit: Payer: Medicaid Other | Admitting: Gastroenterology

## 2017-04-15 ENCOUNTER — Encounter: Payer: Self-pay | Admitting: Gastroenterology

## 2017-04-15 VITALS — BP 136/84 | HR 69 | Temp 98.4°F | Ht 69.0 in | Wt 357.8 lb

## 2017-04-15 DIAGNOSIS — D509 Iron deficiency anemia, unspecified: Secondary | ICD-10-CM

## 2017-04-15 DIAGNOSIS — K219 Gastro-esophageal reflux disease without esophagitis: Secondary | ICD-10-CM | POA: Insufficient documentation

## 2017-04-15 NOTE — Progress Notes (Signed)
Cephas Darby, MD [address]  [place]  [place], [place] [address]  Main: [phone]  Fax: [phone]    Gastroenterology Consultation  Referring Provider:     Donnie Coffin, MD Primary Care Physician:  Donnie Coffin, MD Primary Gastroenterologist:  Dr. Cephas Darby Reason for Consultation:     Heartburn        HPI:   Michelle Mcmillan is a 47 y.o. female referred by Dr. Donnie Coffin, MD  for consultation & management of Chronic heartburn. She has a history of Breast Ca diagnosed in 05/2014 s/p lumpectomy in 05/2014 and XRT in 07/2014, currently in remission Heart burn started 14months ago, once a week, tums help Also, with nocturnal heart burn, regurgitation of acid, feels like choking Denies dysphagia, abdominal pain, nausea, weight loss She is morbidly obese She denies NSAID use, currently not on PPI or H2 blocker She denies weight loss, loss of appetite, smoking or alcohol She does report having chronic anemia and does not know if she has iron deficiency Reports that her second day of menstrual cycle is generally heavy She denies having any lower GI symptoms  Follow up visit 04/15/17: Since last visit, I started her on omeprazole 40 mg daily. Also, her ferritin levels were low normal. Therefore, I started her on ferrous sulfate 325 mg twice daily for 3 months. She underwent EGD and colonoscopy which were unremarkable. Patient is here for follow-up today. She continues to take omeprazole 40 mg daily and denies symptoms of GERD. She lost about 3 pounds in last 2 weeks. She saw a dietitian who is helping with dietary modifications to lose weight. She reports that she feels well overall.  GI Procedures:  EGD 01/23/17 - Normal first portion of the duodenum and second portion of the duodenum. - Normal stomach. - Normal gastroesophageal junction and esophagus. - No specimens collected.  Colonoscopy 01/23/17: - Non-thrombosed external hemorrhoids found on perianal  exam. - The entire examined colon is normal. - External hemorrhoids. - No specimens collected.  Denies any family history of GI malignancy  Past Medical History:  Diagnosis Date  . Anemia    H/O  . Arthritis    RIGHT KNEE  . Breast cancer ([place]) 2017   right breast  . Cancer ([place])    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)  . GERD (gastroesophageal reflux disease)    NO MEDS  . Headache    migraines  . Hypertension   . Last menstrual period (LMP) > 10 days ago 08/2015    Past Surgical History:  Procedure Laterality Date  . BREAST EXCISIONAL BIOPSY Right 06/15/15   breast cancer  . BREAST LUMPECTOMY WITH NEEDLE LOCALIZATION Right 06/15/2015   Procedure: BREAST LUMPECTOMY WITH NEEDLE LOCALIZATION;  Surgeon: Hubbard Robinson, MD;  Location: [facility] ORS;  Service: General;  Laterality: Right;  . BREAST SURGERY    . CESAREAN SECTION  1988   x1  . COLONOSCOPY WITH PROPOFOL N/A 01/23/2017   Procedure: COLONOSCOPY WITH PROPOFOL;  Surgeon: Lin Landsman, MD;  Location: [facility] ENDOSCOPY;  Service: Gastroenterology;  Laterality: N/A;  . ESOPHAGOGASTRODUODENOSCOPY (EGD) WITH PROPOFOL N/A 01/23/2017   Procedure: ESOPHAGOGASTRODUODENOSCOPY (EGD) WITH PROPOFOL;  Surgeon: Lin Landsman, MD;  Location: [facility] ENDOSCOPY;  Service: Gastroenterology;  Laterality: N/A;  . MASS EXCISION N/A 05/20/2016   Procedure: EXCISION CHEST WALL MASS;  Surgeon: Florene Glen, MD;  Location: [facility] ORS;  Service: General;  Laterality: N/A;  . SENTINEL NODE BIOPSY Right 06/15/2015  Procedure: SENTINEL NODE BIOPSY;  Surgeon: Hubbard Robinson, MD;  Location: [facility] ORS;  Service: General;  Laterality: Right;  . TUBAL LIGATION  2004     Current Outpatient Medications:  .  Calcium Carb-Cholecalciferol (CALCIUM-VITAMIN D) 500-200 MG-UNIT tablet, Take by mouth., Disp: , Rfl:  .  calcium-vitamin D (OSCAL WITH D) 500-200 MG-UNIT tablet, Take 1 tablet by mouth 2 (two) times daily., Disp: , Rfl:  .  exemestane (AROMASIN) 25  MG tablet, Take 1 tablet (25 mg total) by mouth daily after breakfast., Disp: 30 tablet, Rfl: 6 .  Ferrous Sulfate (IRON) 325 (65 Fe) MG TABS, Take 1 tablet (325 mg total) by mouth 2 (two) times daily., Disp: 60 each, Rfl: 2 .  hydrochlorothiazide (HYDRODIURIL) 25 MG tablet, Take 25 mg by mouth daily., Disp: , Rfl:  .  ibuprofen (ADVIL,MOTRIN) 800 MG tablet, Take 800 mg by mouth every 8 (eight) hours as needed for moderate pain. , Disp: , Rfl:  .  Prenatal Vit-Fe Fumarate-FA (PRENATAL 1+1 PO), Take 1 tablet by mouth daily., Disp: , Rfl:  .  tetrahydrozoline 0.05 % ophthalmic solution, Place 2 drops into both eyes daily., Disp: , Rfl:  .  omeprazole (PRILOSEC) 40 MG capsule, Take 1 capsule (40 mg total) by mouth daily., Disp: 30 capsule, Rfl: 1   Family History  Problem Relation Age of Onset  . Hypertension Mother   . Hypertension Father   . Hypertension Sister   . Breast cancer Neg Hx      Social History   Tobacco Use  . Smoking status: Never Smoker  . Smokeless tobacco: Never Used  Substance Use Topics  . Alcohol use: Yes    Comment: WINE ONCE MONTH  . Drug use: No    Allergies as of 04/15/2017 - Review Complete 04/15/2017  Allergen Reaction Noted  . Nitroglycerin  04/15/2017    Review of Systems:    All systems reviewed and negative except where noted in HPI.   Physical Exam:  BP 136/84   Pulse 69   Temp 98.4 F (36.9 C) (Oral)   Ht 5\' 9"  (1.753 m)   Wt (!) 357 lb 12.8 oz (162.3 kg)   BMI 52.84 kg/m  No LMP recorded.  General:   Alert,  Well-developed, well-nourished, pleasant and cooperative in NAD, morbidly obese Head:  Normocephalic and atraumatic. Eyes:  Sclera clear, no icterus.   Conjunctiva pink. Ears:  Normal auditory acuity. Nose:  No deformity, discharge, or lesions. Mouth:  No deformity or lesions,oropharynx pink & moist. Neck:  Supple; no masses or thyromegaly. Lungs:  Respirations even and unlabored.  Clear throughout to auscultation.   No wheezes,  crackles, or rhonchi. No acute distress. Heart:  Regular rate and rhythm; no murmurs, clicks, rubs, or gallops. Abdomen:  Normal bowel sounds.  No bruits.  Soft, non-tender and non-distended without masses, hepatosplenomegaly or hernias noted.  No guarding or rebound tenderness.   Rectal: Nor performed Msk:  Symmetrical without gross deformities. Good, equal movement & strength bilaterally. Pulses:  Normal pulses noted. Extremities:  No clubbing or edema.  No cyanosis. Neurologic:  Alert and oriented x3;  grossly normal neurologically. Skin:  Intact without significant lesions or rashes. No jaundice. Lymph Nodes:  No significant cervical adenopathy. Psych:  Alert and cooperative. Normal mood and affect.  Imaging Studies: No Abdominal imaging  Assessment and Plan:   Twisha Vanpelt is a 47 y.o. Black female with morbid obesity, breast cancer status post lumpectomy and XRT currently in remission  with chronic intermittent heartburn, here for follow-up of GERD. She is currently asymptomatic on acid suppressive therapy. EGD is unremarkable.  - I have reiterated to continue to follow antireflux measures, healthy lifestyle including dietary and exercise to lose weight. - Since EGD negative, will decrease omeprazole to 20 mg daily, eventually wean off and recommend taking H2 blocker as needed  Chronic microcytic anemia: - Hemoglobin stable, ferritin low-normal - EGD and colonoscopy unremarkable - Status post oral iron replacement - Recommend rechecking CBC and iron studies by his PCP - No further workup needed at this time from GI standpoint  Colon cancer screening: - Colonoscopy unremarkable, recommend surveillance in 10 years      Follow up in GI clinic as needed   Cephas Darby, MD

## 2017-04-16 ENCOUNTER — Other Ambulatory Visit: Payer: Self-pay

## 2017-04-16 MED ORDER — OMEPRAZOLE 20 MG PO CPDR: 20.0000 mg | DELAYED_RELEASE_CAPSULE | Freq: Every day | ORAL | 3 refills | Status: AC

## 2017-04-28 ENCOUNTER — Ambulatory Visit: Payer: Medicaid Other | Admitting: Radiation Oncology

## 2017-05-06 ENCOUNTER — Other Ambulatory Visit: Payer: Self-pay | Admitting: Surgery

## 2017-05-06 ENCOUNTER — Ambulatory Visit
Admission: RE | Admit: 2017-05-06 | Discharge: 2017-05-06 | Disposition: A | Discharge status: Home / Self Care | Payer: Medicaid Other | Source: Ambulatory Visit | Attending: Surgery | Admitting: Surgery

## 2017-05-06 DIAGNOSIS — N6310 Unspecified lump in the right breast, unspecified quadrant: Secondary | ICD-10-CM | POA: Diagnosis present

## 2017-05-06 DIAGNOSIS — R928 Other abnormal and inconclusive findings on diagnostic imaging of breast: Secondary | ICD-10-CM | POA: Insufficient documentation

## 2017-05-06 DIAGNOSIS — Z853 Personal history of malignant neoplasm of breast: Secondary | ICD-10-CM

## 2017-05-06 DIAGNOSIS — N6312 Unspecified lump in the right breast, upper inner quadrant: Secondary | ICD-10-CM | POA: Diagnosis not present

## 2017-05-06 HISTORY — DX: Personal history of irradiation: Z92.3

## 2017-05-08 ENCOUNTER — Ambulatory Visit
Admission: RE | Admit: 2017-05-08 | Discharge: 2017-05-08 | Disposition: A | Discharge status: Home / Self Care | Payer: Medicaid Other | Source: Ambulatory Visit | Attending: Surgery | Admitting: Surgery

## 2017-05-08 ENCOUNTER — Ambulatory Visit: Payer: Self-pay | Admitting: Surgery

## 2017-05-08 DIAGNOSIS — C50211 Malignant neoplasm of upper-inner quadrant of right female breast: Secondary | ICD-10-CM | POA: Insufficient documentation

## 2017-05-08 DIAGNOSIS — N6310 Unspecified lump in the right breast, unspecified quadrant: Secondary | ICD-10-CM

## 2017-05-08 DIAGNOSIS — R928 Other abnormal and inconclusive findings on diagnostic imaging of breast: Secondary | ICD-10-CM

## 2017-05-08 HISTORY — PX: BREAST BIOPSY: SHX20

## 2017-05-09 ENCOUNTER — Encounter: Payer: Self-pay | Admitting: Radiation Oncology

## 2017-05-09 ENCOUNTER — Telehealth: Payer: Self-pay

## 2017-05-09 ENCOUNTER — Ambulatory Visit
Admission: RE | Admit: 2017-05-09 | Discharge: 2017-05-09 | Disposition: A | Discharge status: Home / Self Care | Payer: Medicaid Other | Source: Ambulatory Visit | Attending: Radiation Oncology | Admitting: Radiation Oncology

## 2017-05-09 ENCOUNTER — Other Ambulatory Visit: Payer: Self-pay

## 2017-05-09 VITALS — BP 138/95 | HR 76 | Temp 97.7°F | Resp 20 | Wt 350.5 lb

## 2017-05-09 DIAGNOSIS — C50411 Malignant neoplasm of upper-outer quadrant of right female breast: Secondary | ICD-10-CM | POA: Diagnosis present

## 2017-05-09 DIAGNOSIS — Z923 Personal history of irradiation: Secondary | ICD-10-CM | POA: Insufficient documentation

## 2017-05-09 DIAGNOSIS — Z170 Estrogen receptor positive status [ER+]: Secondary | ICD-10-CM | POA: Diagnosis not present

## 2017-05-09 DIAGNOSIS — Z79811 Long term (current) use of aromatase inhibitors: Secondary | ICD-10-CM | POA: Insufficient documentation

## 2017-05-09 NOTE — Progress Notes (Signed)
Radiation Oncology Follow up Note  Name: Michelle Mcmillan   Date:   05/09/2017 MRN:  [id number] DOB: 04/01/1971    This 47 y.o. female presents to the clinic today for 18 month follow-up status post whole breast radiation to her right breast for stage I invasive mammary carcinoma ER/PR positive.  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 Oncotype DX showed low risk recurrence she did not receive adjuvant chemotherapy.. Interestingly she did have one lymph node on her original sentinel node biopsy showing no evidence of lymph node tissue but lobular carcinoma in situ was seen. Margins were clear at 3 mm. Unfortunately recently mammogram and ultrasound demonstrated a spiculated mass in the upper outer quadrant of the right breast measuring possibly 1 cm which has been biopsied. Pathology is pending. She specifically denies detecting any mass or nodularity in that breast. She otherwise has been doing well. Patient has been on Aromasin therapy.  COMPLICATIONS OF TREATMENT: none  FOLLOW UP COMPLIANCE: keeps appointments   PHYSICAL EXAM:  BP (!) 138/95   Pulse 76   Temp 97.7 F (36.5 C)   Resp 20   Wt (!) 350 lb 8.5 oz (159 kg)   LMP 04/16/2017   BMI 51.76 kg/m  Lungs are clear to A&P cardiac examination essentially unremarkable with regular rate and rhythm. No dominant mass or nodularity is noted in either breast in 2 positions examined. Incision is well-healed. No axillary or supraclavicular adenopathy is appreciated. Cosmetic result is excellent. No detectable masses noted in the right upper outer quadrant. Well-developed well-nourished patient in NAD. HEENT reveals PERLA, EOMI, discs not visualized.  Oral cavity is clear. No oral mucosal lesions are identified. Neck is clear without evidence of cervical or  supraclavicular adenopathy. Lungs are clear to A&P. Cardiac examination is essentially unremarkable with regular rate and rhythm without murmur rub or thrill. Abdomen is benign with no organomegaly or masses noted. Motor sensory and DTR levels are equal and symmetric in the upper and lower extremities. Cranial nerves II through XII are grossly intact. Proprioception is intact. No peripheral adenopathy or edema is identified. No motor or sensory levels are noted. Crude visual fields are within normal range.  RADIOLOGY RESULTS: Mammograms and ultrasounds are reviewed and compatible with the above-stated findings  PLAN: At this time patient is pending biopsy of new 1 cm mass in the upper outer quadrant of the right breast close to area of previous treatment. This was a high dose treated area. She is also seeing surgeon should this be positive patient may benefit from mastectomy. I've asked to see her back in 6 months for follow-up. I've asked her to follow up with me should she have a positive diagnosis.  I would like to take this opportunity to thank you for allowing me to participate in the care of your patient.Noreene Filbert, MD

## 2017-05-09 NOTE — Telephone Encounter (Signed)
Patient called at this time and advised that she will be having her mammogram and a biopsy to follow and that it will interfere with her appointment that she currently has to follow up with [place]. I advised her that I do not have Dr. Burt Knack back in office until 2/27 however I do have Dr. Hampton Abbot in office on Monday. She stated that she would like to follow up with Dr. Burt Knack. I advised her that we will contact her after her results are in if she needs to be seen sooner. She verbalized understanding.   Call made to patient at this time and I verbalized to her that we would like to see her sooner in office to review her results. I offered her an appointment on 2/11 with Dr. Hampton Abbot at 3:15. She asked if Dr. Burt Knack would be in office. I verbalized that he would not and that I will keep her scheduled appointment with him on 2/27. She verbalized understanding and accepted the appointment.

## 2017-05-12 ENCOUNTER — Ambulatory Visit (INDEPENDENT_AMBULATORY_CARE_PROVIDER_SITE_OTHER): Payer: Medicaid Other | Admitting: Surgery

## 2017-05-12 ENCOUNTER — Ambulatory Visit
Admission: RE | Admit: 2017-05-12 | Discharge: 2017-05-12 | Disposition: A | Discharge status: Home / Self Care | Payer: Medicaid Other | Source: Ambulatory Visit | Attending: Surgery | Admitting: Surgery

## 2017-05-12 ENCOUNTER — Other Ambulatory Visit
Admission: RE | Admit: 2017-05-12 | Discharge: 2017-05-12 | Disposition: A | Discharge status: Home / Self Care | Payer: Medicaid Other | Source: Ambulatory Visit | Attending: Surgery | Admitting: Surgery

## 2017-05-12 ENCOUNTER — Encounter: Payer: Self-pay | Admitting: Surgery

## 2017-05-12 VITALS — BP 139/98 | HR 75 | Temp 97.5°F | Ht 69.0 in | Wt 351.0 lb

## 2017-05-12 DIAGNOSIS — C801 Malignant (primary) neoplasm, unspecified: Secondary | ICD-10-CM

## 2017-05-12 DIAGNOSIS — C50911 Malignant neoplasm of unspecified site of right female breast: Secondary | ICD-10-CM | POA: Diagnosis not present

## 2017-05-12 DIAGNOSIS — C50919 Malignant neoplasm of unspecified site of unspecified female breast: Secondary | ICD-10-CM | POA: Insufficient documentation

## 2017-05-12 LAB — CBC WITH DIFFERENTIAL/PLATELET
Basophils Absolute: 0.1 10*3/uL (ref 0–0.1)
Basophils Relative: 1 %
Eosinophils Absolute: 0.1 10*3/uL (ref 0–0.7)
Eosinophils Relative: 1 %
HEMATOCRIT: 39.7 % (ref 35.0–47.0)
HEMOGLOBIN: 13 g/dL (ref 12.0–16.0)
LYMPHS ABS: 1.6 10*3/uL (ref 1.0–3.6)
Lymphocytes Relative: 22 %
MCH: 25.8 pg — AB (ref 26.0–34.0)
MCHC: 32.7 g/dL (ref 32.0–36.0)
MCV: 78.9 fL — ABNORMAL LOW (ref 80.0–100.0)
MONO ABS: 0.6 10*3/uL (ref 0.2–0.9)
MONOS PCT: 9 %
NEUTROS PCT: 67 %
Neutro Abs: 4.8 10*3/uL (ref 1.4–6.5)
Platelets: 238 10*3/uL (ref 150–440)
RBC: 5.03 MIL/uL (ref 3.80–5.20)
RDW: 18.6 % — AB (ref 11.5–14.5)
WBC: 7.1 10*3/uL (ref 3.6–11.0)

## 2017-05-12 LAB — COMPREHENSIVE METABOLIC PANEL
ALBUMIN: 3.7 g/dL (ref 3.5–5.0)
ALK PHOS: 87 U/L (ref 38–126)
ALT: 28 U/L (ref 14–54)
AST: 25 U/L (ref 15–41)
Anion gap: 12 (ref 5–15)
BUN: 17 mg/dL (ref 6–20)
CHLORIDE: 96 mmol/L — AB (ref 101–111)
CO2: 27 mmol/L (ref 22–32)
CREATININE: 1.17 mg/dL — AB (ref 0.44–1.00)
Calcium: 9 mg/dL (ref 8.9–10.3)
GFR calc non Af Amer: 55 mL/min — ABNORMAL LOW (ref >60)
GLUCOSE: 112 mg/dL — AB (ref 65–99)
Potassium: 3.6 mmol/L (ref 3.5–5.1)
SODIUM: 135 mmol/L (ref 135–145)
Total Bilirubin: 0.6 mg/dL (ref 0.3–1.2)
Total Protein: 8 g/dL (ref 6.5–8.1)

## 2017-05-12 NOTE — Patient Instructions (Signed)
We would like for you to stop by the Lab in the [place] of the hospital for lab work as well as a chest xray.   We have sent your referral to Plastic Surgeon Debbra Riding in [place].   We have sent the referral to Dr.Finnegans office as well. If you have not heard from anyone regarding an appointment by next Friday please call our office so we can check on this for you.  Please see your follow up appointment listed below.

## 2017-05-12 NOTE — Progress Notes (Signed)
05/12/2017  History of Present Illness: Michelle Mcmillan is a 47 y.o. female with a history of right breast cancer, s/p lumpectomy and SNBx with Dr. Azalee Course in 05/2015.  She subsequently underwent radiation with Dr. Baruch Gouty and hormonal therapy with Dr. Grayland Ormond.  She also had an epidermal inclusion cyst on her left thorax which was removed by Dr. Burt Knack in 05/2016.  Dr. Burt Knack has been following her for her breast cancer history and last saw her on 10/2016.  She had a follow up mammogram on 2/5 with expected follow up appointment with Dr. Burt Knack on 2/27.  However, her mammogram has a suspicious finding on the right breast.  This was biopsied on 2/7 and was positive for invasive breast cancer.  She presents now for further discussion on management.  She denies any fevers or chills.  She reports decreased appetite from time to time but otherwise is eating well.  She reports bone type of pain that has been ongoing since she was started on Tamoxifen after her lumpectomy, but no new pain or worsening pain recently.  Denies any abdominal pain, nausea, or vomiting.  Denies any palpable masses of the right breast and she does do self exams every month.  Past Medical History: Past Medical History:  Diagnosis Date  . Anemia    H/O  . Arthritis    RIGHT KNEE  . Breast cancer ([place]) 2017   right breast  . Cancer ([place])    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)  . GERD (gastroesophageal reflux disease)    NO MEDS  . Headache    migraines  . Hypertension   . Last menstrual period (LMP) > 10 days ago 08/2015  . Personal history of radiation therapy      Past Surgical History: Past Surgical History:  Procedure Laterality Date  . BREAST BIOPSY Right 05/08/2017   invasive mamm. ca  . BREAST EXCISIONAL BIOPSY Right 06/15/15   breast cancer  . BREAST LUMPECTOMY WITH NEEDLE LOCALIZATION Right 06/15/2015   Procedure: BREAST LUMPECTOMY WITH NEEDLE LOCALIZATION;  Surgeon: Hubbard Robinson, MD;  Location: [facility] ORS;   Service: General;  Laterality: Right;  . BREAST SURGERY    . CESAREAN SECTION  1988   x1  . COLONOSCOPY WITH PROPOFOL N/A 01/23/2017   Procedure: COLONOSCOPY WITH PROPOFOL;  Surgeon: Lin Landsman, MD;  Location: [facility] ENDOSCOPY;  Service: Gastroenterology;  Laterality: N/A;  . ESOPHAGOGASTRODUODENOSCOPY (EGD) WITH PROPOFOL N/A 01/23/2017   Procedure: ESOPHAGOGASTRODUODENOSCOPY (EGD) WITH PROPOFOL;  Surgeon: Lin Landsman, MD;  Location: [facility] ENDOSCOPY;  Service: Gastroenterology;  Laterality: N/A;  . MASS EXCISION N/A 05/20/2016   Procedure: EXCISION CHEST WALL MASS;  Surgeon: Florene Glen, MD;  Location: [facility] ORS;  Service: General;  Laterality: N/A;  . SENTINEL NODE BIOPSY Right 06/15/2015   Procedure: SENTINEL NODE BIOPSY;  Surgeon: Hubbard Robinson, MD;  Location: [facility] ORS;  Service: General;  Laterality: Right;  . TUBAL LIGATION  2004    Home Medications: Prior to Admission medications   Medication Sig Start Date End Date Taking? Authorizing Provider  Calcium Carb-Cholecalciferol (CALCIUM-VITAMIN D) 500-200 MG-UNIT tablet Take by mouth.   Yes [provider]  calcium-vitamin D (OSCAL WITH D) 500-200 MG-UNIT tablet Take 1 tablet by mouth 2 (two) times daily.   Yes [provider]  exemestane (AROMASIN) 25 MG tablet Take 1 tablet (25 mg total) by mouth daily after breakfast. 01/14/17  Yes Finnegan, Kathlene November, MD  Ferrous Sulfate (IRON) 325 (65 Fe) MG TABS Take 1 tablet (  325 mg total) by mouth 2 (two) times daily. 12/20/16  Yes Vanga, Tally Due, MD  hydrochlorothiazide (HYDRODIURIL) 25 MG tablet Take 25 mg by mouth daily.   Yes [provider]  ibuprofen (ADVIL,MOTRIN) 800 MG tablet Take 800 mg by mouth every 8 (eight) hours as needed for moderate pain.    Yes [provider]  omeprazole (PRILOSEC) 20 MG capsule Take 1 capsule (20 mg total) by mouth daily. 04/16/17  Yes Vanga, Tally Due, MD  Prenatal Vit-Fe Fumarate-FA (PRENATAL 1+1  PO) Take 1 tablet by mouth daily.   Yes [provider]  tetrahydrozoline 0.05 % ophthalmic solution Place 2 drops into both eyes daily.   Yes [provider]    Allergies: Allergies  Allergen Reactions  . Nitroglycerin     headache    Review of Systems: Review of Systems  Constitutional: Negative for chills and fever.  Respiratory: Negative for shortness of breath.   Cardiovascular: Negative for chest pain.  Gastrointestinal: Negative for abdominal pain, nausea and vomiting.  Skin: Negative for rash.    Physical Exam BP (!) 139/98   Pulse 75   Temp (!) 97.5 F (36.4 C) (Oral)   Ht 5\' 9"  (1.753 m)   Wt (!) 159.2 kg (351 lb)   LMP 04/16/2017   BMI 51.83 kg/m  CONSTITUTIONAL: No acute distress RESPIRATORY:  Lungs are clear, and breath sounds are equal bilaterally. Normal respiratory effort without pathologic use of accessory muscles. CARDIOVASCULAR: Heart is regular without murmurs, gallops, or rubs. BREAST:  Right breast with biopsy site with steri strips and band-aid.  No palpable masses on right breast, and no skin changes, nipple changes or drainage.  No palpable lymphadenopathy.  Prior lumpectomy and node biopsy site has healed well.  On the left breast, there were no palpable masses or lymphadenopathy, and there was no skin change or nipple pathology.  Prior left thorax mass excision site has healed well. GI: The abdomen is soft, non-distended, nontender. There were no palpable masses.  SKIN: Skin turgor is normal. There are no pathologic skin lesions.  NEUROLOGIC:  Motor and sensation is grossly normal.  Cranial nerves are grossly intact. PSYCH:  Alert and oriented to person, place and time. Affect is normal.  Labs/Imaging: None recently  Assessment and Plan: This is a 46 y.o. female who presents with recurrent right breast cancer, two years after right wire-localized lumpectomy and sentinel node biopsy.  I have independently viewed the patient's  imaging studies and reviewed her medical records.  Discussed with the patient the new results of her biopsy and the management options.  Given her prior lumpectomy with radiation and node biopsy, she would need a right mastectomy, and likely axillary node dissection vs repeat sentinel biopsy.  Dr. Burt Knack will be seeing the patient next week to discuss plans further.  She is interested in reconstruction after mastectomy as well.  As part of her workup and management, will obtain new set of labs and start with CXR.  Will discuss her on tumor board as well.  Will send new referral for Dr. Grayland Ormond with Oncology and [name] with plastic surgery.  Dr. Burt Knack will see her next week as well.  Spent 40 minutes with the patient, of which more than 50% of the time was in discussion and review with patient of her condition and planning.   Melvyn Neth, [place]

## 2017-05-13 ENCOUNTER — Telehealth: Payer: Self-pay

## 2017-05-13 DIAGNOSIS — C50919 Malignant neoplasm of unspecified site of unspecified female breast: Secondary | ICD-10-CM

## 2017-05-13 LAB — SURGICAL PATHOLOGY

## 2017-05-13 NOTE — Progress Notes (Signed)
Error

## 2017-05-13 NOTE — Progress Notes (Signed)
  Oncology Nurse Navigator Documentation  Navigator Location: [facility] Onc (05/13/17 0900) Referral date to RadOnc/MedOnc: 05/15/17 (05/13/17 0900) )Navigator Encounter Type: Telephone (05/13/17 0900)                         Barriers/Navigation Needs: Coordination of Care (05/13/17 0900)   Interventions: Coordination of Care (05/13/17 0900)   Coordination of Care: Appts (05/13/17 0900)                  Time Spent with Patient: 15 (05/13/17 0900)   Patient scheduled with Dr. Grayland Ormond on 05/15/17 to discuss treatment plan for recurrence of right breast cancer.  Left message with appointment information. Request patient call back with confirmation.

## 2017-05-13 NOTE — Telephone Encounter (Signed)
error 

## 2017-05-13 NOTE — Telephone Encounter (Signed)
Per Dr.Piscoya-please order CT A&P w/ contrast and Bone scan prior to appointment 05/19/17 with Dr.Cooper.   Patient notified of appointments listed below.  Nothing to eat or drink 4 hours prior. Please come to Radiology department @ [facility] to pick up prep kit and instructions.   CT scan 05/16/17 @ 12:00 pm [facility] Bone scan- 05/16/17 @ 12:00 pm [facility]

## 2017-05-14 ENCOUNTER — Telehealth: Payer: Self-pay | Admitting: Surgery

## 2017-05-14 NOTE — Telephone Encounter (Signed)
Patient has been referred to Dr Marla Roe with [facility] for consultation of breast reconstruction.  Appointment has been made and patient is aware.  Date: 05/19/17 @ 2:15pm with Dr Marla Roe. [place] [id number].  She will also see Dr Burt Knack on 05/19/17 @ 10:30am.  All notes have been faxed to [phone].

## 2017-05-15 ENCOUNTER — Encounter: Payer: Self-pay | Admitting: Oncology

## 2017-05-15 ENCOUNTER — Inpatient Hospital Stay: Payer: Medicaid Other | Attending: Oncology | Admitting: Oncology

## 2017-05-15 VITALS — BP 121/76 | HR 76 | Temp 97.8°F | Resp 20 | Wt 350.0 lb

## 2017-05-15 DIAGNOSIS — Z923 Personal history of irradiation: Secondary | ICD-10-CM | POA: Diagnosis not present

## 2017-05-15 DIAGNOSIS — C50411 Malignant neoplasm of upper-outer quadrant of right female breast: Secondary | ICD-10-CM

## 2017-05-15 DIAGNOSIS — I10 Essential (primary) hypertension: Secondary | ICD-10-CM | POA: Diagnosis not present

## 2017-05-15 DIAGNOSIS — Z170 Estrogen receptor positive status [ER+]: Secondary | ICD-10-CM | POA: Diagnosis not present

## 2017-05-15 NOTE — Progress Notes (Signed)
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   INTERVAL HISTORY: Patient returns to clinic today for further evaluation after having found to have a biopsy proven recurrence locally in her right breast.  She is anxious, but otherwise feels well. She has no neurologic complaints. She denies any recent fevers or illnesses. She has a good appetite and denies weight loss. She denies any pain. She has no chest pain or shortness of breath. She denies any nausea, vomiting, constipation, or diarrhea. She has no urinary complaints. Patient offers no further specific complaints today.  REVIEW OF SYSTEMS:   Review of Systems  Constitutional: Negative for fever, malaise/fatigue and weight loss.  Respiratory: Negative.  Negative for cough and shortness of breath.   Cardiovascular: Negative.  Negative for chest pain and leg swelling.  Gastrointestinal: Negative.  Negative for abdominal pain.  Genitourinary: Negative.  Negative for dysuria.  Musculoskeletal: Negative.  Negative for myalgias.  Skin: Negative.  Negative for rash.  Neurological: Negative.  Negative for sensory change and weakness.  Psychiatric/Behavioral: The patient is nervous/anxious.     As per HPI. Otherwise, a complete review of systems is negative.  PAST MEDICAL HISTORY: Past Medical History:  Diagnosis Date  . Anemia    H/O  . Arthritis    RIGHT KNEE  . Breast cancer ([place]) 2017   right breast  . Cancer ([place])    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)  . GERD (gastroesophageal reflux disease)    NO MEDS  . Headache    migraines  . Hypertension   .  Last menstrual period (LMP) > 10 days ago 08/2015  . Personal history of radiation therapy     PAST SURGICAL HISTORY: Past Surgical History:  Procedure Laterality Date  . BREAST BIOPSY Right 05/08/2017   invasive mamm. ca  . BREAST EXCISIONAL BIOPSY Right 06/15/15   breast cancer  . BREAST LUMPECTOMY WITH NEEDLE LOCALIZATION Right 06/15/2015   Procedure: BREAST LUMPECTOMY WITH NEEDLE LOCALIZATION;  Surgeon: Hubbard Robinson, MD;  Location: [facility] ORS;  Service: General;  Laterality: Right;  . BREAST SURGERY    . CESAREAN SECTION  1988   x1  . COLONOSCOPY WITH PROPOFOL N/A 01/23/2017   Procedure: COLONOSCOPY WITH PROPOFOL;  Surgeon: Lin Landsman, MD;  Location: [facility] ENDOSCOPY;  Service: Gastroenterology;  Laterality: N/A;  . ESOPHAGOGASTRODUODENOSCOPY (EGD) WITH PROPOFOL N/A 01/23/2017   Procedure: ESOPHAGOGASTRODUODENOSCOPY (EGD) WITH PROPOFOL;  Surgeon: Lin Landsman, MD;  Location: [facility] ENDOSCOPY;  Service: Gastroenterology;  Laterality: N/A;  . MASS EXCISION N/A 05/20/2016   Procedure: EXCISION CHEST WALL MASS;  Surgeon: Florene Glen, MD;  Location: [facility] ORS;  Service: General;  Laterality: N/A;  . SENTINEL NODE BIOPSY Right 06/15/2015   Procedure: SENTINEL NODE BIOPSY;  Surgeon: Hubbard Robinson, MD;  Location: [facility] ORS;  Service: General;  Laterality: Right;  . TUBAL LIGATION  2004    FAMILY HISTORY Family History  Problem Relation Age of Onset  . Hypertension Mother   . Hypertension Father   . Hypertension Sister   . Breast cancer Neg Hx        ADVANCED DIRECTIVES:    HEALTH MAINTENANCE: Social  History   Tobacco Use  . Smoking status: Never Smoker  . Smokeless tobacco: Never Used  Substance Use Topics  . Alcohol use: Yes    Comment: WINE ONCE MONTH  . Drug use: No    Allergies  Allergen Reactions  . Nitroglycerin     headache    Current Outpatient Medications  Medication Sig Dispense Refill  . calcium-vitamin D (OSCAL WITH D) 500-200  MG-UNIT tablet Take 1 tablet by mouth 2 (two) times daily.    Marland Kitchen exemestane (AROMASIN) 25 MG tablet Take 1 tablet (25 mg total) by mouth daily after breakfast. 30 tablet 6  . Ferrous Sulfate (IRON) 325 (65 Fe) MG TABS Take 1 tablet (325 mg total) by mouth 2 (two) times daily. 60 each 2  . hydrochlorothiazide (HYDRODIURIL) 25 MG tablet Take 25 mg by mouth daily.    Marland Kitchen ibuprofen (ADVIL,MOTRIN) 800 MG tablet Take 800 mg by mouth every 8 (eight) hours as needed for moderate pain.     Marland Kitchen omeprazole (PRILOSEC) 20 MG capsule Take 1 capsule (20 mg total) by mouth daily. 30 capsule 3  . Prenatal Vit-Fe Fumarate-FA (PRENATAL 1+1 PO) Take 1 tablet by mouth daily.    Marland Kitchen tetrahydrozoline 0.05 % ophthalmic solution Place 2 drops into both eyes daily.     No current facility-administered medications for this visit.     OBJECTIVE: Vitals:   05/15/17 1439  BP: 121/76  Pulse: 76  Resp: 20  Temp: 97.8 F (36.6 C)     Body mass index is 51.69 kg/m.    ECOG FS:0 - Asymptomatic  General: Well-developed, well-nourished, no acute distress. Eyes: Pink conjunctiva, anicteric sclera. Breasts: Bilateral breasts and axilla without lumps or masses.  Exam deferred today. Lungs: Clear to auscultation bilaterally. Heart: Regular rate and rhythm. No rubs, murmurs, or gallops. Abdomen: Soft, nontender, nondistended. No organomegaly noted, normoactive bowel sounds. Musculoskeletal: No edema, cyanosis, or clubbing. Neuro: Alert, answering all questions appropriately. Cranial nerves grossly intact. Skin: No rashes or petechiae noted. Psych: Normal affect.   LAB RESULTS:  Lab Results  Component Value Date   NA 135 05/12/2017   K 3.6 05/12/2017   CL 96 (L) 05/12/2017   CO2 27 05/12/2017   GLUCOSE 112 (H) 05/12/2017   BUN 17 05/12/2017   CREATININE 1.17 (H) 05/12/2017   CALCIUM 9.0 05/12/2017   PROT 8.0 05/12/2017   ALBUMIN 3.7 05/12/2017   AST 25 05/12/2017   ALT 28 05/12/2017   ALKPHOS 87 05/12/2017    BILITOT 0.6 05/12/2017   GFRNONAA 55 (L) 05/12/2017   GFRAA >60 05/12/2017    Lab Results  Component Value Date   WBC 7.1 05/12/2017   NEUTROABS 4.8 05/12/2017   HGB 13.0 05/12/2017   HCT 39.7 05/12/2017   MCV 78.9 (L) 05/12/2017   PLT 238 05/12/2017     STUDIES: Dg Chest 2 View  Result Date: 05/13/2017 CLINICAL DATA:  recurrent breast cancer EXAM: CHEST  2 VIEW COMPARISON:  08/06/2015 FINDINGS: Normal heart size. Lungs clear. No pneumothorax. No pleural effusion. IMPRESSION: No active cardiopulmonary disease. Electronically Signed   By: Marybelle Killings M.D.   On: 05/13/2017 07:49   US Breast Ltd Uni Right Inc Axilla  Result Date: 05/06/2017 CLINICAL DATA:  History of right breast cancer post lumpectomy 06/15/2015 followed by radiation therapy. EXAM: 2D DIGITAL DIAGNOSTIC BILATERAL MAMMOGRAM WITH CAD AND ADJUNCT TOMO RIGHT BREAST ULTRASOUND COMPARISON:  Previous exam(s). ACR Breast Density Category b: There are scattered areas of fibroglandular density. FINDINGS:  There is a mass with spiculated margins in the upper slightly inner right breast measuring approximately 1 cm. Stable lumpectomy changes are present in the upper-outer posterior right breast. No suspicious masses or calcifications are seen in the left breast. Mammographic images were processed with CAD. Physical examination of the far upper right breast does not definitely reveal any palpable masses. Targeted ultrasound of the right breast was performed demonstrating an oval hypoechoic mass with slight margin irregularity at 12 o'clock 8 cm from nipple measuring 1 x 1 x 0.9 cm. This corresponds well with the mass seen in the upper right breast at mammography. No definite lymphadenopathy seen in the right axilla. IMPRESSION: Suspicious right breast mass. RECOMMENDATION: Ultrasound-guided biopsy of the mass in the right breast at the 12 o'clock position is recommended. This will be arranged for the patient. I have discussed the findings and  recommendations with the patient. Results were also provided in writing at the conclusion of the visit. If applicable, a reminder letter will be sent to the patient regarding the next appointment. BI-RADS CATEGORY  4: Suspicious. Electronically Signed   By: Everlean Alstrom M.D.   On: 05/06/2017 11:59   Mm Diag Breast Tomo Bilateral  Result Date: 05/06/2017 CLINICAL DATA:  History of right breast cancer post lumpectomy 06/15/2015 followed by radiation therapy. EXAM: 2D DIGITAL DIAGNOSTIC BILATERAL MAMMOGRAM WITH CAD AND ADJUNCT TOMO RIGHT BREAST ULTRASOUND COMPARISON:  Previous exam(s). ACR Breast Density Category b: There are scattered areas of fibroglandular density. FINDINGS: There is a mass with spiculated margins in the upper slightly inner right breast measuring approximately 1 cm. Stable lumpectomy changes are present in the upper-outer posterior right breast. No suspicious masses or calcifications are seen in the left breast. Mammographic images were processed with CAD. Physical examination of the far upper right breast does not definitely reveal any palpable masses. Targeted ultrasound of the right breast was performed demonstrating an oval hypoechoic mass with slight margin irregularity at 12 o'clock 8 cm from nipple measuring 1 x 1 x 0.9 cm. This corresponds well with the mass seen in the upper right breast at mammography. No definite lymphadenopathy seen in the right axilla. IMPRESSION: Suspicious right breast mass. RECOMMENDATION: Ultrasound-guided biopsy of the mass in the right breast at the 12 o'clock position is recommended. This will be arranged for the patient. I have discussed the findings and recommendations with the patient. Results were also provided in writing at the conclusion of the visit. If applicable, a reminder letter will be sent to the patient regarding the next appointment. BI-RADS CATEGORY  4: Suspicious. Electronically Signed   By: Everlean Alstrom M.D.   On: 05/06/2017 11:59    Mm Clip Placement Right  Result Date: 05/08/2017 CLINICAL DATA:  47 year old patient with suspicious mass in the 12 12:30 position of the right breast for which ultrasound-guided biopsy was recommended. She has a history of right breast cancer at the 10 o'clock position diagnosed in February 2017. EXAM: DIAGNOSTIC RIGHT MAMMOGRAM POST ULTRASOUND BIOPSY COMPARISON:  Previous exam(s). FINDINGS: Mammographic images were obtained following ultrasound guided biopsy of a suspicious mass at the 12 to 12:30 position 8 cm from the nipple in the right breast. Coil shaped biopsy clip is 5 mm directly anterior to the biopsied mass. IMPRESSION: Coil shaped biopsy clip 5 mm directly anterior to the biopsied mass. Final Assessment: Post Procedure Mammograms for Marker Placement Electronically Signed   By: Curlene Dolphin M.D.   On: 05/08/2017 13:44   [place] Rt Breast Bx  W Loc Dev 1st Lesion Img Bx Spec US Guide  Result Date: 05/08/2017 CLINICAL DATA:  47 year old patient with suspicious mass in the 12 o'clock to 12:30 position of the right breast. Ultrasound-guided biopsy was recommended. The patient has a history of right breast cancer diagnosed in the 10 o'clock position in 2017. EXAM: ULTRASOUND GUIDED RIGHT BREAST CORE NEEDLE BIOPSY COMPARISON:  Previous exam(s). FINDINGS: I met with the patient and we discussed the procedure of ultrasound-guided biopsy, including benefits and alternatives. We discussed the high likelihood of a successful procedure. We discussed the risks of the procedure, including infection, bleeding, tissue injury, clip migration, and inadequate sampling. Informed written consent was given. The usual time-out protocol was performed immediately prior to the procedure. Lesion quadrant: Lower inner quadrant Using sterile technique and 1% Lidocaine as local anesthetic, under direct ultrasound visualization, a 12 gauge spring-loaded device was used to perform biopsy of a 1.0 cm suspicious mass using a lateral  to medial approach. At the conclusion of the procedure a coil tissue marker clip was deployed into the biopsy cavity. Follow up 2 view mammogram was performed and dictated separately. IMPRESSION: Ultrasound guided biopsy of the right breast. No apparent complications. Electronically Signed   By: Curlene Dolphin M.D.   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 BRCA 1 and 2 negative, Oncotype DX score 17 which is considered low risk.  PLAN:    1. 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  She has follow-up with surgery on May 19, 2017 to discuss reexcision and possible total mastectomy.  We will also consider CT scans to assess for systemic disease based on the results of her surgery.  Patient will also likely require chemotherapy and will return to clinic 1-2 weeks after her surgery for further evaluation and treatment planning.   2. Genetic testing: Patient was found to be BRCA 1 and 2 negative. She expressed understanding that although her genetic testing was negative, her children are at higher risk for breast cancer than the general population.  3. Bone mineral density: Bone density from September 04, 2016 reported Z score of 1.0 which is considered normal. Repeat in June 2020.  Approximately 30 minutes was spent in discussion of which greater than 50% was consultation.   Patient expressed understanding and was in agreement with this plan. She also understands that She can call clinic at any time with any questions, concerns, or complaints.    Lloyd Huger, MD 05/15/17 3:04 PM

## 2017-05-15 NOTE — Progress Notes (Signed)
  Oncology Nurse Navigator Documentation  Navigator Location: [facility]Med Onc (05/15/17 1600) Referral date to RadOnc/MedOnc: 05/15/17 (05/15/17 1600) )Navigator Encounter Type: Initial MedOnc (05/15/17 1600)   Abnormal Finding Date: 05/06/17 (05/15/17 1600) Confirmed Diagnosis Date: 05/08/17 (05/15/17 1600)         Multidisiplinary Clinic Date: 05/12/17 (05/15/17 1600) Multidisiplinary Clinic Type: Breast (05/15/17 1600)   Patient Visit Type: MedOnc;Initial (05/15/17 1600)   Barriers/Navigation Needs: Coordination of Care;Family concerns;Financial (05/15/17 1600)   Interventions: Coordination of Care (05/15/17 1600)   Coordination of Care: Appts (05/15/17 1600)    Support Groups/Services: Breast Support Group;[employer](counselor) (05/15/17 1600)             Time Spent with Patient: 90 (05/15/17 1600)   Met with patient and husband at initial Med/Onc visit for new diagnosis.  Patient 2 years an one day out from previous breast cancer diagnosis.  Support given.  She is scheduled for CT tomorrow, and meets with Dr. Burt Knack, and [name] on 05/19/17 to plan surgery/reconstruction. Patient to call with date of surgery.  Will schedule follow-up with Dr. Grayland Ormond 1-2 weeks post-op.

## 2017-05-16 ENCOUNTER — Ambulatory Visit
Admission: RE | Admit: 2017-05-16 | Discharge: 2017-05-16 | Disposition: A | Discharge status: Home / Self Care | Payer: Medicaid Other | Source: Ambulatory Visit | Attending: Surgery | Admitting: Surgery

## 2017-05-16 ENCOUNTER — Encounter
Admission: RE | Admit: 2017-05-16 | Discharge: 2017-05-16 | Disposition: A | Discharge status: Home / Self Care | Payer: Medicaid Other | Source: Ambulatory Visit | Attending: Surgery | Admitting: Surgery

## 2017-05-16 DIAGNOSIS — R948 Abnormal results of function studies of other organs and systems: Secondary | ICD-10-CM | POA: Insufficient documentation

## 2017-05-16 DIAGNOSIS — C50919 Malignant neoplasm of unspecified site of unspecified female breast: Secondary | ICD-10-CM | POA: Diagnosis present

## 2017-05-16 MED ORDER — IOPAMIDOL (ISOVUE-300) INJECTION 61%
125.0000 mL | Freq: Once | INTRAVENOUS | Status: AC | PRN
  Administered 2017-05-16: 125 mL via INTRAVENOUS

## 2017-05-16 MED ORDER — TECHNETIUM TC 99M MEDRONATE IV KIT: 22.7100 | PACK | Freq: Once | INTRAVENOUS | Status: DC | PRN

## 2017-05-19 ENCOUNTER — Ambulatory Visit (INDEPENDENT_AMBULATORY_CARE_PROVIDER_SITE_OTHER): Payer: Medicaid Other | Admitting: Surgery

## 2017-05-19 ENCOUNTER — Telehealth: Payer: Self-pay

## 2017-05-19 ENCOUNTER — Other Ambulatory Visit: Payer: Self-pay | Admitting: Surgery

## 2017-05-19 ENCOUNTER — Encounter: Payer: Self-pay | Admitting: Surgery

## 2017-05-19 VITALS — BP 129/90 | HR 69 | Temp 97.8°F | Ht 69.0 in | Wt 355.0 lb

## 2017-05-19 DIAGNOSIS — C50919 Malignant neoplasm of unspecified site of unspecified female breast: Secondary | ICD-10-CM

## 2017-05-19 DIAGNOSIS — C50911 Malignant neoplasm of unspecified site of right female breast: Secondary | ICD-10-CM | POA: Insufficient documentation

## 2017-05-19 NOTE — Patient Instructions (Signed)
Please let us know what you decide after your appointment today with Dr.Claire Dillenham.

## 2017-05-19 NOTE — Telephone Encounter (Signed)
error 

## 2017-05-19 NOTE — Progress Notes (Addendum)
Outpatient Surgical Follow Up  05/19/2017  Michelle Mcmillan is an 47 y.o. female.   CC: Right breast cancer  HPI: This a patient with a right breast cancer measuring less than 1 cm on ultrasound however it is possibly a recurrence of a previously treated breast cancer with similar receptor status less than 2 years ago.  She had had breast conservation therapy treated by Dr. Azalee Course.  She had radiotherapy.  This recurrence or de novo cancer has arisen while on Arimidex.  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  Proximally 40 minutes were spent in counseling time with this patient and her husband concerning relating to a probable recurrent breast cancer The patient has no other problems at this time.  No new medical problems.  She has seen Dr. Grayland Ormond last week and is set to see [name] this afternoon.  I have phone calls out to both Dr. Louretta Shorten and to Dr. Grayland Ormond  Past Medical History:  Diagnosis Date  . Anemia    H/O  . Arthritis    RIGHT KNEE  . Breast cancer ([place]) 2017   right breast  . Cancer ([place])    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)  . GERD (gastroesophageal reflux disease)    NO MEDS  . Headache    migraines  . Hypertension   . Last menstrual period (LMP) > 10 days ago 08/2015  . Personal history of radiation therapy     Past Surgical History:  Procedure Laterality Date  . BREAST BIOPSY Right 05/08/2017   invasive mamm. ca  . BREAST EXCISIONAL BIOPSY Right 06/15/15   breast cancer  . BREAST LUMPECTOMY WITH NEEDLE LOCALIZATION Right 06/15/2015   Procedure: BREAST LUMPECTOMY WITH NEEDLE LOCALIZATION;  Surgeon: Hubbard Robinson, MD;  Location: [facility] ORS;  Service: General;  Laterality: Right;  . BREAST SURGERY    . CESAREAN SECTION  1988   x1  . COLONOSCOPY WITH PROPOFOL N/A 01/23/2017   Procedure: COLONOSCOPY WITH PROPOFOL;  Surgeon: Lin Landsman, MD;  Location: [facility] ENDOSCOPY;  Service: Gastroenterology;  Laterality: N/A;  . ESOPHAGOGASTRODUODENOSCOPY (EGD)  WITH PROPOFOL N/A 01/23/2017   Procedure: ESOPHAGOGASTRODUODENOSCOPY (EGD) WITH PROPOFOL;  Surgeon: Lin Landsman, MD;  Location: [facility] ENDOSCOPY;  Service: Gastroenterology;  Laterality: N/A;  . MASS EXCISION N/A 05/20/2016   Procedure: EXCISION CHEST WALL MASS;  Surgeon: Florene Glen, MD;  Location: [facility] ORS;  Service: General;  Laterality: N/A;  . SENTINEL NODE BIOPSY Right 06/15/2015   Procedure: SENTINEL NODE BIOPSY;  Surgeon: Hubbard Robinson, MD;  Location: [facility] ORS;  Service: General;  Laterality: Right;  . TUBAL LIGATION  2004    Family History  Problem Relation Age of Onset  . Hypertension Mother   . Hypertension Father   . Hypertension Sister   . Breast cancer Neg Hx     Social History:  reports that  has never smoked. she has never used smokeless tobacco. She reports that she drinks alcohol. She reports that she does not use drugs.  Allergies:  Allergies  Allergen Reactions  . Nitroglycerin     headache    Medications reviewed.   Review of Systems:   Review of Systems  Constitutional: Negative.   HENT: Negative.   Eyes: Negative.   Respiratory: Negative.   Cardiovascular: Negative.   Gastrointestinal: Negative.   Genitourinary: Negative.   Musculoskeletal: Negative.   Skin: Negative.   Neurological: Negative.   Endo/Heme/Allergies: Negative.   Psychiatric/Behavioral: Negative.      Physical Exam:  BP 129/90   Pulse 69   Temp 97.8 F (36.6 C) (Oral)   Ht _0  (1.753 m)   Wt (!) 355 lb (161 kg)   LMP 05/14/2017 (Exact Date) Comment: tubal ligation  BMI 52.42 kg/m   Physical Exam  Constitutional: She is oriented to person, place, and time and well-developed, well-nourished, and in no distress. No distress.  Morbidly obese no acute distress  HENT:  Head: Normocephalic and atraumatic.  Eyes: Pupils are equal, round, and reactive to light. Right eye exhibits no discharge. Left eye exhibits no discharge. No scleral icterus.  Neck: Normal  range of motion.  Cardiovascular: Normal rate, regular rhythm and normal heart sounds.  Pulmonary/Chest: Effort normal and breath sounds normal. No respiratory distress. She has no wheezes. She has no rales.  Abdominal: Soft. There is no tenderness.  Musculoskeletal: Normal range of motion. She exhibits no edema or tenderness.  Lymphadenopathy:    She has no cervical adenopathy.  Neurological: She is alert and oriented to person, place, and time.  Skin: Skin is warm and dry. No rash noted. She is not diaphoretic. No erythema.  Psychiatric: Mood and affect normal.  Vitals reviewed.   Breast exam.  In the right breast her Steri-Strips in the upper outer quadrant.  Her right upper quadrant scar is well-healed without erythema no palpable mass in the right breast.  In the left breast there is no palpable mass no axillary adenopathy.  No results found for this or any previous visit (from the past 48 hour(s)). No results found.  Assessment/Plan:  This a patient with a right breast cancer it is biopsy-proven ER PR positive HER-2/neu negative.  This is a difficult situation as it may represent a recurrence in a similar area.  Or a de novo cancer.  She has had excision and radiotherapy and a sentinel node biopsy.  I have phone calls out to Dr. Grayland Ormond and to Dr. Allena Earing.  I discussed for approximately 40 minutes the options involving surgical intervention and the likelihood that a sentinel node would be an accurate if repeated in this situation where she has had breast conservation therapy with radiotherapy in this right axilla.  I see a sentinel node being an accurate and unnecessary but a completion node dissection may be important in this patient.  I also discussed bilateral mastectomies with reconstruction.  She is seeing the plastic surgeon this afternoon.  I have a phone call out to discuss options as well.  There are many options involved here and I would like to have the discussion with the  other consultants concerning these options but could proceed with surgery as early as the end of this week.  This was discussed with the patient.  Her husband was present.  I discussed the risk of nonhealing wound in a radiated breast and also the risk of lymphedema which is higher considering it has been radiated and has already had a sentinel node biopsy.  They will notify me of their decision once information is present.  Florene Glen, MD, FACS  Discussed with Drs Marla Roe and Grayland Ormond. Plan rt total mastectomy, NO AND as small recurrence, risk of edema and no add'l inf gained or used for decision making. Will get chemo regardless.

## 2017-05-21 ENCOUNTER — Telehealth: Payer: Self-pay | Admitting: Surgery

## 2017-05-21 MED ORDER — CEFAZOLIN SODIUM-DEXTROSE 2-4 GM/100ML-% IV SOLN
2.0000 g | INTRAVENOUS | Status: AC
  Administered 2017-05-22: 2 g via INTRAVENOUS

## 2017-05-21 NOTE — Telephone Encounter (Signed)
Pt advised of pre op date/time and sx date. Sx: 05/22/17 with Dr Charm Barges total mastectomy.  Pre op: 05/22/17-Day of surgery--will arrive 2 hours early-10:00am at pre admit-[facility].   Patient was advised to to be NPO after midnight prior to surgery.

## 2017-05-22 ENCOUNTER — Observation Stay
Admission: RE | Admit: 2017-05-22 | Discharge: 2017-05-23 | Disposition: A | Discharge status: Home / Self Care | Payer: Medicaid Other | Source: Ambulatory Visit | Attending: Surgery | Admitting: Surgery

## 2017-05-22 ENCOUNTER — Inpatient Hospital Stay: Payer: Medicaid Other | Admitting: Anesthesiology

## 2017-05-22 ENCOUNTER — Other Ambulatory Visit: Payer: Self-pay

## 2017-05-22 ENCOUNTER — Encounter: Payer: Self-pay | Admitting: *Deleted

## 2017-05-22 ENCOUNTER — Encounter
Admission: RE | Disposition: A | Discharge status: Home / Self Care | Payer: Self-pay | Source: Ambulatory Visit | Attending: Surgery

## 2017-05-22 DIAGNOSIS — I10 Essential (primary) hypertension: Secondary | ICD-10-CM | POA: Insufficient documentation

## 2017-05-22 DIAGNOSIS — D630 Anemia in neoplastic disease: Secondary | ICD-10-CM | POA: Insufficient documentation

## 2017-05-22 DIAGNOSIS — Z6841 Body Mass Index (BMI) 40.0 and over, adult: Secondary | ICD-10-CM | POA: Diagnosis not present

## 2017-05-22 DIAGNOSIS — K219 Gastro-esophageal reflux disease without esophagitis: Secondary | ICD-10-CM | POA: Diagnosis not present

## 2017-05-22 DIAGNOSIS — Z923 Personal history of irradiation: Secondary | ICD-10-CM | POA: Insufficient documentation

## 2017-05-22 DIAGNOSIS — N6001 Solitary cyst of right breast: Secondary | ICD-10-CM | POA: Diagnosis not present

## 2017-05-22 DIAGNOSIS — Z170 Estrogen receptor positive status [ER+]: Secondary | ICD-10-CM | POA: Insufficient documentation

## 2017-05-22 DIAGNOSIS — C50411 Malignant neoplasm of upper-outer quadrant of right female breast: Principal | ICD-10-CM

## 2017-05-22 DIAGNOSIS — C50919 Malignant neoplasm of unspecified site of unspecified female breast: Secondary | ICD-10-CM | POA: Diagnosis present

## 2017-05-22 DIAGNOSIS — Z79811 Long term (current) use of aromatase inhibitors: Secondary | ICD-10-CM | POA: Diagnosis not present

## 2017-05-22 DIAGNOSIS — Z79899 Other long term (current) drug therapy: Secondary | ICD-10-CM | POA: Diagnosis not present

## 2017-05-22 HISTORY — PX: TOTAL MASTECTOMY: SHX6129

## 2017-05-22 LAB — BASIC METABOLIC PANEL
Anion gap: 9 (ref 5–15)
BUN: 17 mg/dL (ref 6–20)
CHLORIDE: 103 mmol/L (ref 101–111)
CO2: 29 mmol/L (ref 22–32)
CREATININE: 1.11 mg/dL — AB (ref 0.44–1.00)
Calcium: 8.6 mg/dL — ABNORMAL LOW (ref 8.9–10.3)
GFR calc non Af Amer: 58 mL/min — ABNORMAL LOW (ref >60)
Glucose, Bld: 95 mg/dL (ref 65–99)
Potassium: 3.6 mmol/L (ref 3.5–5.1)
SODIUM: 141 mmol/L (ref 135–145)

## 2017-05-22 LAB — CBC WITH DIFFERENTIAL/PLATELET
Basophils Absolute: 0 10*3/uL (ref 0–0.1)
Basophils Relative: 1 %
EOS ABS: 0.1 10*3/uL (ref 0–0.7)
EOS PCT: 1 %
HCT: 39.7 % (ref 35.0–47.0)
Hemoglobin: 12.5 g/dL (ref 12.0–16.0)
LYMPHS ABS: 1.4 10*3/uL (ref 1.0–3.6)
Lymphocytes Relative: 24 %
MCH: 25.2 pg — AB (ref 26.0–34.0)
MCHC: 31.6 g/dL — AB (ref 32.0–36.0)
MCV: 79.7 fL — ABNORMAL LOW (ref 80.0–100.0)
MONOS PCT: 7 %
Monocytes Absolute: 0.4 10*3/uL (ref 0.2–0.9)
NEUTROS PCT: 67 %
Neutro Abs: 3.7 10*3/uL (ref 1.4–6.5)
PLATELETS: 214 10*3/uL (ref 150–440)
RBC: 4.98 MIL/uL (ref 3.80–5.20)
RDW: 18.8 % — AB (ref 11.5–14.5)
WBC: 5.6 10*3/uL (ref 3.6–11.0)

## 2017-05-22 LAB — POCT PREGNANCY, URINE: PREG TEST UR: NEGATIVE

## 2017-05-22 SURGERY — MASTECTOMY, SIMPLE
Anesthesia: General | Laterality: Right | Wound class: Clean

## 2017-05-22 MED ORDER — EXEMESTANE 25 MG PO TABS
25.0000 mg | ORAL_TABLET | Freq: Every day | ORAL | Status: DC
  Administered 2017-05-23: 25 mg via ORAL
  Filled 2017-05-22: qty 1

## 2017-05-22 MED ORDER — FENTANYL CITRATE (PF) 100 MCG/2ML IJ SOLN
INTRAMUSCULAR | Status: AC
  Filled 2017-05-22: qty 2

## 2017-05-22 MED ORDER — HEPARIN SODIUM (PORCINE) 5000 UNIT/ML IJ SOLN
5000.0000 [IU] | Freq: Three times a day (TID) | INTRAMUSCULAR | Status: DC
  Administered 2017-05-22 – 2017-05-23 (×3): 5000 [IU] via SUBCUTANEOUS
  Filled 2017-05-22 (×3): qty 1

## 2017-05-22 MED ORDER — HEPARIN SODIUM (PORCINE) 5000 UNIT/ML IJ SOLN
5000.0000 [IU] | Freq: Once | INTRAMUSCULAR | Status: AC
  Administered 2017-05-22: 5000 [IU] via SUBCUTANEOUS

## 2017-05-22 MED ORDER — MIDAZOLAM HCL 2 MG/2ML IJ SOLN
INTRAMUSCULAR | Status: DC | PRN
  Administered 2017-05-22: 2 mg via INTRAVENOUS

## 2017-05-22 MED ORDER — LACTATED RINGERS IV SOLN
INTRAVENOUS | Status: DC
  Administered 2017-05-22: 11:00:00 via INTRAVENOUS

## 2017-05-22 MED ORDER — FENTANYL CITRATE (PF) 100 MCG/2ML IJ SOLN
INTRAMUSCULAR | Status: DC | PRN
  Administered 2017-05-22 (×4): 50 ug via INTRAVENOUS

## 2017-05-22 MED ORDER — SUCCINYLCHOLINE CHLORIDE 20 MG/ML IJ SOLN
INTRAMUSCULAR | Status: DC | PRN
  Administered 2017-05-22: 100 mg via INTRAVENOUS

## 2017-05-22 MED ORDER — CHLORHEXIDINE GLUCONATE CLOTH 2 % EX PADS: 6.0000 | MEDICATED_PAD | Freq: Once | CUTANEOUS | Status: DC

## 2017-05-22 MED ORDER — PANTOPRAZOLE SODIUM 40 MG PO TBEC
40.0000 mg | DELAYED_RELEASE_TABLET | Freq: Every day | ORAL | Status: DC
  Administered 2017-05-23: 40 mg via ORAL
  Filled 2017-05-22: qty 1

## 2017-05-22 MED ORDER — BUPIVACAINE-EPINEPHRINE 0.5% -1:200000 IJ SOLN
INTRAMUSCULAR | Status: DC | PRN
  Administered 2017-05-22: 30 mL

## 2017-05-22 MED ORDER — ONDANSETRON HCL 4 MG/2ML IJ SOLN: 4.0000 mg | Freq: Once | INTRAMUSCULAR | Status: DC | PRN

## 2017-05-22 MED ORDER — MORPHINE SULFATE (PF) 4 MG/ML IV SOLN
2.0000 mg | INTRAVENOUS | Status: DC | PRN
Start: 2017-05-22 — End: 2017-05-23
  Administered 2017-05-22 (×3): 2 mg via INTRAVENOUS
  Filled 2017-05-22 (×3): qty 1

## 2017-05-22 MED ORDER — ONDANSETRON HCL 4 MG PO TABS
4.0000 mg | ORAL_TABLET | Freq: Four times a day (QID) | ORAL | Status: DC | PRN
  Filled 2017-05-22: qty 1

## 2017-05-22 MED ORDER — FENTANYL CITRATE (PF) 100 MCG/2ML IJ SOLN
INTRAMUSCULAR | Status: AC
  Administered 2017-05-22: 25 ug via INTRAVENOUS
  Filled 2017-05-22: qty 2

## 2017-05-22 MED ORDER — PROPOFOL 10 MG/ML IV BOLUS
INTRAVENOUS | Status: DC | PRN
  Administered 2017-05-22: 100 mg via INTRAVENOUS
  Administered 2017-05-22: 50 mg via INTRAVENOUS

## 2017-05-22 MED ORDER — MIDAZOLAM HCL 2 MG/2ML IJ SOLN
INTRAMUSCULAR | Status: AC
  Filled 2017-05-22: qty 2

## 2017-05-22 MED ORDER — LIDOCAINE HCL (CARDIAC) 20 MG/ML IV SOLN
INTRAVENOUS | Status: DC | PRN
  Administered 2017-05-22: 100 mg via INTRAVENOUS

## 2017-05-22 MED ORDER — FAMOTIDINE 20 MG PO TABS
ORAL_TABLET | ORAL | Status: AC
  Administered 2017-05-22: 20 mg
  Filled 2017-05-22: qty 1

## 2017-05-22 MED ORDER — KETOROLAC TROMETHAMINE 30 MG/ML IJ SOLN
INTRAMUSCULAR | Status: DC | PRN
  Administered 2017-05-22: 30 mg via INTRAVENOUS

## 2017-05-22 MED ORDER — HYDROCODONE-ACETAMINOPHEN 5-325 MG PO TABS
1.0000 | ORAL_TABLET | ORAL | Status: DC | PRN
  Administered 2017-05-23: 2 via ORAL
  Administered 2017-05-23: 1 via ORAL
  Filled 2017-05-22: qty 2
  Filled 2017-05-22: qty 1

## 2017-05-22 MED ORDER — BUPIVACAINE-EPINEPHRINE (PF) 0.5% -1:200000 IJ SOLN
INTRAMUSCULAR | Status: AC
  Filled 2017-05-22: qty 30

## 2017-05-22 MED ORDER — ONDANSETRON HCL 4 MG/2ML IJ SOLN
4.0000 mg | Freq: Four times a day (QID) | INTRAMUSCULAR | Status: DC | PRN
  Administered 2017-05-22: 4 mg via INTRAVENOUS
  Filled 2017-05-22: qty 2

## 2017-05-22 MED ORDER — ESMOLOL HCL 100 MG/10ML IV SOLN
INTRAVENOUS | Status: AC
  Filled 2017-05-22: qty 10

## 2017-05-22 MED ORDER — SUGAMMADEX SODIUM 200 MG/2ML IV SOLN
INTRAVENOUS | Status: DC | PRN
  Administered 2017-05-22: 322 mg via INTRAVENOUS

## 2017-05-22 MED ORDER — DEXAMETHASONE SODIUM PHOSPHATE 10 MG/ML IJ SOLN
INTRAMUSCULAR | Status: AC
  Filled 2017-05-22: qty 1

## 2017-05-22 MED ORDER — ONDANSETRON HCL 4 MG/2ML IJ SOLN
INTRAMUSCULAR | Status: AC
  Filled 2017-05-22: qty 2

## 2017-05-22 MED ORDER — CALCIUM CARBONATE-VITAMIN D 500-200 MG-UNIT PO TABS
1.0000 | ORAL_TABLET | Freq: Two times a day (BID) | ORAL | Status: DC
  Administered 2017-05-22 – 2017-05-23 (×2): 1 via ORAL
  Filled 2017-05-22 (×2): qty 1

## 2017-05-22 MED ORDER — ROCURONIUM BROMIDE 100 MG/10ML IV SOLN
INTRAVENOUS | Status: DC | PRN
  Administered 2017-05-22: 20 mg via INTRAVENOUS
  Administered 2017-05-22: 10 mg via INTRAVENOUS
  Administered 2017-05-22 (×2): 20 mg via INTRAVENOUS

## 2017-05-22 MED ORDER — HYDROCHLOROTHIAZIDE 25 MG PO TABS
25.0000 mg | ORAL_TABLET | Freq: Every day | ORAL | Status: DC
  Administered 2017-05-23: 25 mg via ORAL
  Filled 2017-05-22: qty 1

## 2017-05-22 MED ORDER — FENTANYL CITRATE (PF) 100 MCG/2ML IJ SOLN
25.0000 ug | INTRAMUSCULAR | Status: AC | PRN
  Administered 2017-05-22 (×6): 25 ug via INTRAVENOUS

## 2017-05-22 MED ORDER — HEPARIN SODIUM (PORCINE) 5000 UNIT/ML IJ SOLN
INTRAMUSCULAR | Status: AC
  Administered 2017-05-22: 5000 [IU] via SUBCUTANEOUS
  Filled 2017-05-22: qty 1

## 2017-05-22 MED ORDER — LACTATED RINGERS IV SOLN
INTRAVENOUS | Status: DC | PRN
  Administered 2017-05-22: 12:00:00 via INTRAVENOUS

## 2017-05-22 MED ORDER — CEFAZOLIN SODIUM-DEXTROSE 2-4 GM/100ML-% IV SOLN
INTRAVENOUS | Status: AC
Start: 2017-05-22 — End: 2017-05-22
  Filled 2017-05-22: qty 100

## 2017-05-22 SURGICAL SUPPLY — 31 items
ADHESIVE MASTISOL STRL (MISCELLANEOUS) ×3 IMPLANT
APPLIER CLIP 11 MED OPEN (CLIP)
APPLIER CLIP 9.375 SM OPEN (CLIP)
BAG COUNTER SPONGE EZ (MISCELLANEOUS) IMPLANT
CANISTER SUCT 1200ML W/VALVE (MISCELLANEOUS) ×3 IMPLANT
CHLORAPREP W/TINT 26ML (MISCELLANEOUS) ×3 IMPLANT
CLIP APPLIE 11 MED OPEN (CLIP) IMPLANT
CLIP APPLIE 9.375 SM OPEN (CLIP) IMPLANT
CLOSURE WOUND 1/2 X4 (GAUZE/BANDAGES/DRESSINGS) ×1
COUNTER SPONGE BAG EZ (MISCELLANEOUS)
DRAPE LAPAROTOMY 100X77 ABD (DRAPES) ×3 IMPLANT
DRSG TELFA 3X8 NADH (GAUZE/BANDAGES/DRESSINGS) ×3 IMPLANT
ELECT REM PT RETURN 9FT ADLT (ELECTROSURGICAL) ×3
ELECTRODE REM PT RTRN 9FT ADLT (ELECTROSURGICAL) ×1 IMPLANT
GAUZE SPONGE 4X4 12PLY STRL (GAUZE/BANDAGES/DRESSINGS) IMPLANT
GLOVE BIO SURGEON STRL SZ8 (GLOVE) ×18 IMPLANT
GOWN STRL REUS W/ TWL LRG LVL3 (GOWN DISPOSABLE) ×3 IMPLANT
GOWN STRL REUS W/TWL LRG LVL3 (GOWN DISPOSABLE) ×6
JACKSON PRATT 10 (INSTRUMENTS) ×6 IMPLANT
KIT TURNOVER KIT A (KITS) ×3 IMPLANT
LABEL OR SOLS (LABEL) ×3 IMPLANT
PACK BASIN MAJOR ARMC (MISCELLANEOUS) ×3 IMPLANT
STRIP CLOSURE SKIN 1/2X4 (GAUZE/BANDAGES/DRESSINGS) ×2 IMPLANT
SUT ETHILON 3-0 FS-10 30 BLK (SUTURE) ×3
SUT MNCRL 4-0 (SUTURE) ×4
SUT MNCRL 4-0 27XMFL (SUTURE) ×2
SUT VIC AB 2-0 CT1 (SUTURE) ×12 IMPLANT
SUT VICRYL 3-0 CR8 SH (SUTURE) ×3 IMPLANT
SUTURE EHLN 3-0 FS-10 30 BLK (SUTURE) ×1 IMPLANT
SUTURE MNCRL 4-0 27XMF (SUTURE) ×2 IMPLANT
WATER STERILE IRR 1000ML POUR (IV SOLUTION) ×3 IMPLANT

## 2017-05-22 NOTE — Progress Notes (Signed)
Preoperative Review   Patient is met in the preoperative holding area. The history is reviewed in the chart and with the patient. I personally reviewed the options and rationale as well as the risks of this procedure that have been previously discussed with the patient.  Patient is marked on the right side.  All questions asked by the patient and/or family were answered to their satisfaction.  I have spoken to both Dr. Grayland Ormond and [name] this week by telephone.  I also spoke to the patient following these discussions.  The consensus is that the sentinel node biopsy would not be necessary and could be misleading.  The decision to give the patient chemotherapy has already been made therefore in a clinically negative axilla there is no real reason to put the patient at risk of lymphedema.  The information given by node dissection would not change the management of this patient and only increase her risk for lymphedema.  Therefore a total mastectomy will be performed without axillary dissection.  I did mention to the patient that there is a possibility that nodes may appear in the specimen with the axillary tail and that that that is not uncommon but that is not the goal.  I also discussed with the patient and with Dr. Grayland Ormond and the fact that he had told me that the patient does not really need a port but that a port could be placed at a later date should it be required.  Nurse navigator's had suggested that the port be placed.  I discussed with the patient port placement but would defer that at this time and could place it at a later date as the complications from a port that is deemed partly unnecessary is pacing the patient at risk for port related complications if she does not really need the port.  The port could be added at any time later should it become necessary.  This was discussed with the patient and she understood and agreed with this plan.  Discussion with plastic and reconstructive  surgery suggested that the reconstructive portion including a prophylactic mastectomy and reconstruction be delayed and done at a later date.  She understands this as well.   Patient agrees to proceed with this procedure at this time.  Florene Glen M.D. FACS

## 2017-05-22 NOTE — Anesthesia Procedure Notes (Signed)
Procedure Name: Intubation Performed by: Philbert Riser, CRNA Pre-anesthesia Checklist: Patient identified, Emergency Drugs available, Suction available, Patient being monitored and Timeout performed Patient Re-evaluated:Patient Re-evaluated prior to induction Oxygen Delivery Method: Circle system utilized and Simple face mask Preoxygenation: Pre-oxygenation with 100% oxygen Induction Type: IV induction Ventilation: Mask ventilation without difficulty Laryngoscope Size: McGraph and 3 Grade View: Grade I Tube type: Oral Tube size: 7.5 mm Number of attempts: 1 Airway Equipment and Method: Stylet

## 2017-05-22 NOTE — Op Note (Signed)
05/22/2017  2:18 PM  PATIENT:  Michelle Mcmillan  47 y.o. female  PRE-OPERATIVE DIAGNOSIS: Right breast cancer, recurrent  POST-OPERATIVE DIAGNOSIS: Right breast cancer, recurrent  PROCEDURE: Right total mastectomy  SURGEON:  Florene Glen MD, FACS  ANESTHESIA: General with endotracheal tube  Assistant surgical tech  Indications this the patient has had a prior breast conservation therapy with lumpectomy and radiation who presents with a recurrence less than 2 years later in the same area with very similar pathologic morphology.  She has been on aromatase inhibitor with ER PR positivity but has still had a recurrence.  She was presented at breast cancer conference and Dr. Grayland Ormond has consulted from oncology and the suggestion was that she did not need an axillary dissection and a lymph node biopsy (sentinel node) would not be accurate in a patient with prior surgery and radiation.  Was decided to perform a total mastectomy only based on these suggestions as well as the additional risk of lymphedema in the radiated breast.  This is all discussed with her she understood and agreed to proceed.  We also discussed and reemphasized the difficulties in healing the wound in a radiated breast and skin and that there is a risk of cosmetic deformity as well as dehiscence.  She has seen [name] duct of surgery in the future.   Details of Procedure: Patient was induced to general anesthesia prepped and draped in a sterile fashion.  A surgical pause was held.  The patient had been marked in the preop holding area.  A lenticular shaped incision was drawn out to encompass the nipple areolar complex as well as the prior partial mastectomy site in the upper outer quadrant near the axilla.  The scar was completely encompassed in the mastectomy site.  Starting with the upper flap was executed utilizing a sharp dissection followed by electrocautery dissection.  Large vessels were handled with the use of  clips and electrocautery was used on smaller vessels.  Attention was turned to the lower flap where a similar procedure was performed.  Starting medially the breast tissue was elevated off of the deltopectoral fascia with electrocautery and the use of clips on larger vessels.  This was carried medial to lateral.  The tail of the specimen was labeled axillary tail with a long silk suture.  A deep node was identified in the axillary tail and was labeled with a short silk suture.  Specimen sent off for examination.  The axillary cavity was not entered and there was minimal scar noted from prior sentinel node biopsy this was not violated.  Hemostasis was with electrocautery and clips the area was irrigated with copious amounts of warm water and hemostasis again checked and found to be adequate.  The JP drains were placed through 2 separate lateral incisions one over the pectoralis major one lateral to the pectoralis and the large cavity left after removal of the obese mastectomy specimen.  These JP drains were held in with 3-0 nylon.  The patient was placed in a upright position somewhat approximately 30 degrees and the wound was closed with interrupted figure-of-eight 0 Vicryl sutures and interrupted figure-of-eight or simple sutures of 3-0 Vicryl as well.  Once this watertight closure was completed 4-0 Monocryl was utilized in a running fashion at the skin edges.  30 cc of Marcaine with epinephrine was placed through the drains equally and the drains were capped.  Steri-Strips and Mastisol were placed in a sterile dressing was placed.  After a sufficient  amount of time the JP drains were placed to bulb suction and a small amount of the Marcaine or serous fluid exuded.  The sponge lap needle count was correct.  Patient was taken to the recovery room in stable condition to be admitted for observation overnight in the head up position.  Estimated blood loss was 50 cc.   Florene Glen, MD FACS

## 2017-05-22 NOTE — Anesthesia Postprocedure Evaluation (Signed)
Anesthesia Post Note  Patient: [name]  Procedure(s) Performed: TOTAL MASTECTOMY (Right )  Patient location during evaluation: PACU Anesthesia Type: General Level of consciousness: awake and alert Pain management: pain level controlled Vital Signs Assessment: post-procedure vital signs reviewed and stable Respiratory status: spontaneous breathing, nonlabored ventilation, respiratory function stable and patient connected to nasal cannula oxygen Cardiovascular status: blood pressure returned to baseline and stable Postop Assessment: no apparent nausea or vomiting Anesthetic complications: no     Last Vitals:  Vitals:   05/22/17 1518 05/22/17 1541  BP: 110/75 119/69  Pulse: (!) 46 (!) 53  Resp: 11 17  Temp:  36.5 C  SpO2: 100% 100%    Last Pain:  Vitals:   05/22/17 1541  TempSrc: Oral  PainSc:                  Reford Olliff S

## 2017-05-22 NOTE — Anesthesia Post-op Follow-up Note (Signed)
Anesthesia QCDR form completed.        

## 2017-05-22 NOTE — Progress Notes (Signed)
  Oncology Nurse Navigator Documentation  Navigator Location: [facility] (05/22/17 1100)   )Navigator Encounter Type: Other(Pre-op) (05/22/17 1100)       Surgery Date: 05/22/17 (05/22/17 1100)                 Barriers/Navigation Needs: Coordination of Care (05/22/17 1100)                          Time Spent with Patient: 55 (05/22/17 1100)   Met with patient, family prior to surgery.  Questioned whether she was having port placement also.  Patient not sure. Discussed with staff  Surgical.  Stated order received last week.   Discussed with Dr. Grayland Ormond today.  Per Dr. Grayland Ormond, patient should do well without port due to the planned treatment  if it is not placed today.  This information was relayed to staff at [facility].

## 2017-05-22 NOTE — Progress Notes (Signed)
Visited patient has a postop check.  She is tolerating a clear liquid liquid diet at this point and describing moderate pain.  She just received pain medication.  HR 66  BP 120/78 Appears comfortable Dressing is dry and intact Proximally 30 cc of bloody fluid in drains  Patient doing very well postoperatively needs better pain control.  Probably home in morning

## 2017-05-22 NOTE — Transfer of Care (Signed)
Immediate Anesthesia Transfer of Care Note  Patient: Michelle Mcmillan  Procedure(s) Performed: TOTAL MASTECTOMY (Right )  Patient Location: PACU  Anesthesia Type:General  Level of Consciousness: awake  Airway & Oxygen Therapy: Patient Spontanous Breathing  Post-op Assessment: Report given to RN  Post vital signs: Reviewed  Last Vitals:  Vitals:   05/22/17 1022  BP: 117/81  Pulse: 67  Resp: 16  Temp: 36.5 C  SpO2: 100%    Last Pain:  Vitals:   05/22/17 1022  TempSrc: Tympanic  PainSc: 0-No pain         Complications: No apparent anesthesia complications

## 2017-05-22 NOTE — Anesthesia Preprocedure Evaluation (Addendum)
Anesthesia Evaluation  Patient identified by MRN, date of birth, ID band Patient awake    Reviewed: Allergy & Precautions, NPO status , Patient's Chart, lab work & pertinent test results, reviewed documented beta blocker date and time   Airway Mallampati: III  TM Distance: >3 FB     Dental  (+) Chipped   Pulmonary           Cardiovascular hypertension, Pt. on medications      Neuro/Psych  Headaches,    GI/Hepatic GERD  Controlled,  Endo/Other  Morbid obesity  Renal/GU      Musculoskeletal  (+) Arthritis ,   Abdominal   Peds  Hematology  (+) anemia ,   Anesthesia Other Findings Overbite.  Reproductive/Obstetrics                           Anesthesia Physical Anesthesia Plan  ASA: III  Anesthesia Plan: General   Post-op Pain Management:    Induction: Intravenous  PONV Risk Score and Plan:   Airway Management Planned: Oral ETT  Additional Equipment:   Intra-op Plan:   Post-operative Plan:   Informed Consent: I have reviewed the patients History and Physical, chart, labs and discussed the procedure including the risks, benefits and alternatives for the proposed anesthesia with the patient or authorized representative who has indicated his/her understanding and acceptance.     Plan Discussed with: CRNA  Anesthesia Plan Comments:        Anesthesia Quick Evaluation

## 2017-05-23 ENCOUNTER — Encounter: Payer: Self-pay | Admitting: Surgery

## 2017-05-23 ENCOUNTER — Other Ambulatory Visit: Payer: Self-pay

## 2017-05-23 DIAGNOSIS — C50411 Malignant neoplasm of upper-outer quadrant of right female breast: Secondary | ICD-10-CM | POA: Diagnosis not present

## 2017-05-23 LAB — CBC
HCT: 35 % (ref 35.0–47.0)
Hemoglobin: 11.3 g/dL — ABNORMAL LOW (ref 12.0–16.0)
MCH: 25.7 pg — AB (ref 26.0–34.0)
MCHC: 32.3 g/dL (ref 32.0–36.0)
MCV: 79.8 fL — ABNORMAL LOW (ref 80.0–100.0)
PLATELETS: 223 10*3/uL (ref 150–440)
RBC: 4.38 MIL/uL (ref 3.80–5.20)
RDW: 18.8 % — ABNORMAL HIGH (ref 11.5–14.5)
WBC: 6.4 10*3/uL (ref 3.6–11.0)

## 2017-05-23 MED ORDER — ONDANSETRON HCL 4 MG PO TABS: 4.0000 mg | ORAL_TABLET | Freq: Three times a day (TID) | ORAL | 0 refills | Status: DC | PRN

## 2017-05-23 MED ORDER — HYDROCODONE-ACETAMINOPHEN 5-325 MG PO TABS: 1.0000 | ORAL_TABLET | ORAL | 0 refills | Status: DC | PRN

## 2017-05-23 MED ORDER — MORPHINE SULFATE 2 MG/ML IJ SOLN
2.0000 mg | INTRAMUSCULAR | Status: DC | PRN
  Administered 2017-05-23: 2 mg via INTRAVENOUS
  Filled 2017-05-23: qty 1

## 2017-05-23 NOTE — Discharge Summary (Signed)
Physician Discharge Summary  Patient ID: Michelle Mcmillan MRN: [id number] DOB/AGE: 04-04-1970 47 y.o.  Admit date: 05/22/2017 Discharge date: 05/23/2017   Discharge Diagnoses:  Active Problems:   Malignant neoplasm of upper-outer quadrant of right female breast ([place])   Breast cancer ([place])   Procedures: Right total mastectomy  Hospital Course: This a patient who was admitted to the hospital for elective right total mastectomy which was performed without difficulty and without incident.  She is morbidly obese and has had radiation therapy to the right breast and this is a recurrent breast cancer.  Her husband is aware of the management of her 2 drain tubes.  She is discharged in stable condition with thorough instructions given verbally shower and replace a dressing around the drains and record output of the drains twice a day.  She will follow-up midweek for possible drain removal.  Wound check will be performed as well currently there is no sign of ischemia.  Pathology is pending and will be reviewed midweek.  Consults: None  Disposition: 01-Home or Self Care   Allergies as of 05/23/2017      Reactions   Nitroglycerin Other (See Comments)   headache      Medication List    TAKE these medications   BLUE-EMU SUPER STRENGTH EX Apply 1 application topically daily as needed (for pain).   calcium-vitamin D 500-200 MG-UNIT tablet Commonly known as:  OSCAL WITH D Take 1 tablet by mouth 2 (two) times daily.   exemestane 25 MG tablet Commonly known as:  AROMASIN Take 1 tablet (25 mg total) by mouth daily after breakfast.   hydrochlorothiazide 25 MG tablet Commonly known as:  HYDRODIURIL Take 25 mg by mouth daily.   HYDROcodone-acetaminophen 5-325 MG tablet Commonly known as:  NORCO/VICODIN Take 1-2 tablets by mouth every 4 (four) hours as needed for moderate pain.   ibuprofen 800 MG tablet Commonly known as:  ADVIL,MOTRIN Take 800 mg by mouth every 8 (eight) hours as needed for  headache or moderate pain.   Iron 325 (65 Fe) MG Tabs Take 1 tablet (325 mg total) by mouth 2 (two) times daily.   omeprazole 20 MG capsule Commonly known as:  PRILOSEC Take 1 capsule (20 mg total) by mouth daily.   PRENATAL 1+1 PO Take 1 tablet by mouth daily.   tetrahydrozoline 0.05 % ophthalmic solution Place 2 drops into both eyes daily.      Follow-up Information    Florene Glen, MD. Go on 05/29/2017.   Specialty:  Surgery Why:  Thursday at 1:45pm for hospital follow-up Contact information: [place] [id number] [phone]           Florene Glen, MD, FACS

## 2017-05-23 NOTE — Progress Notes (Signed)
Michelle Mcmillan  A and O x 4. VSS. Pt tolerating diet well. No complaints of pain or nausea. IV removed intact, prescriptions given. Family given education on dressing change. Pt voiced understanding of discharge instructions with no further questions. Pt discharged via wheelchair with axillary.  Lynann Bologna MSN, RN-BC  Allergies as of 05/23/2017      Reactions   Nitroglycerin Other (See Comments)   headache      Medication List    TAKE these medications   BLUE-EMU SUPER STRENGTH EX Apply 1 application topically daily as needed (for pain).   calcium-vitamin D 500-200 MG-UNIT tablet Commonly known as:  OSCAL WITH D Take 1 tablet by mouth 2 (two) times daily.   exemestane 25 MG tablet Commonly known as:  AROMASIN Take 1 tablet (25 mg total) by mouth daily after breakfast.   hydrochlorothiazide 25 MG tablet Commonly known as:  HYDRODIURIL Take 25 mg by mouth daily.   HYDROcodone-acetaminophen 5-325 MG tablet Commonly known as:  NORCO/VICODIN Take 1-2 tablets by mouth every 4 (four) hours as needed for moderate pain.   ibuprofen 800 MG tablet Commonly known as:  ADVIL,MOTRIN Take 800 mg by mouth every 8 (eight) hours as needed for headache or moderate pain.   Iron 325 (65 Fe) MG Tabs Take 1 tablet (325 mg total) by mouth 2 (two) times daily.   omeprazole 20 MG capsule Commonly known as:  PRILOSEC Take 1 capsule (20 mg total) by mouth daily.   PRENATAL 1+1 PO Take 1 tablet by mouth daily.   tetrahydrozoline 0.05 % ophthalmic solution Place 2 drops into both eyes daily.       Vitals:   05/23/17 0932 05/23/17 1224  BP: 117/70 111/66  Pulse:  66  Resp:  14  Temp:  98.5 F (36.9 C)  SpO2:  97%

## 2017-05-23 NOTE — Progress Notes (Signed)
Patient feels well and believe she can be discharged after lunch.  Her husband is present as a but has been taught drain care.  Drain output is minimal now and inspected.  Plan discharge later today.

## 2017-05-23 NOTE — Progress Notes (Signed)
1 Day Post-Op  Subjective: Patient feels better today than she did last night when I visited her for a postoperative check.  She has no nausea and her pain is better controlled.  Objective: Vital signs in last 24 hours: Temp:  [97.4 F (36.3 C)-98.4 F (36.9 C)] 98 F (36.7 C) (02/22 0539) Pulse Rate:  [46-141] 58 (02/22 0539) Resp:  [7-18] 18 (02/22 0539) BP: (110-130)/(69-88) 127/72 (02/22 0539) SpO2:  [95 %-100 %] 97 % (02/22 0539) Weight:  [355 lb (161 kg)] 355 lb (161 kg) (02/21 1022) Last BM Date: 05/22/17  Intake/Output from previous day: 02/21 0701 - 02/22 0700 In: [place] [P.O.:720; I.V.:1100] Out: 772 [Urine:500; Drains:267; Blood:5] Intake/Output this shift: No intake/output data recorded.  Physical exam:  Vital signs are stable and afebrile wound is clean without erythema or ischemic changes.  Drain output was somewhat elevated but is minimal now.  Serosanguineous only.  Calves are nontender  Lab Results: CBC  Recent Labs    05/22/17 1031 05/23/17 0444  WBC 5.6 6.4  HGB 12.5 11.3*  HCT 39.7 35.0  PLT 214 223   BMET Recent Labs    05/22/17 1031  NA 141  K 3.6  CL 103  CO2 29  GLUCOSE 95  BUN 17  CREATININE 1.11*  CALCIUM 8.6*   PT/INR No results for input(s): LABPROT, INR in the last 72 hours. ABG No results for input(s): PHART, HCO3 in the last 72 hours.  Invalid input(s): PCO2, PO2  Studies/Results: No results found.  Anti-infectives: Anti-infectives (From admission, onward)   Start     Dose/Rate Route Frequency Ordered Stop   05/22/17 1024  ceFAZolin (ANCEF) 2-4 GM/100ML-% IVPB    Comments:  Machia, Millissa   : cabinet override      05/22/17 1024 05/22/17 2244   05/22/17 0600  ceFAZolin (ANCEF) IVPB 2g/100 mL premix     2 g 200 mL/hr over 30 Minutes Intravenous On call to O.R. 05/21/17 2212 05/22/17 1230      Assessment/Plan: s/p Procedure(s): TOTAL MASTECTOMY   Small drop in hemoglobin.  Patient doing well this morning.  Will  revisit this afternoon and likely be able to discharge.  Teach drain care.  Florene Glen, MD, FACS  05/23/2017

## 2017-05-23 NOTE — Discharge Instructions (Signed)
Remove dressing in 24 hours. May shower in 24 hours. Leave paper strips in place. Resume all home medications. Routine drain care, empty and record output every 12 hours Follow-up with Dr. Burt Knack in 6  days.

## 2017-05-24 LAB — HIV ANTIBODY (ROUTINE TESTING W REFLEX): HIV SCREEN 4TH GENERATION: NONREACTIVE

## 2017-05-25 LAB — SURGICAL PATHOLOGY

## 2017-05-27 ENCOUNTER — Telehealth: Payer: Self-pay

## 2017-05-27 NOTE — Telephone Encounter (Signed)
Spoke with patient and her husband at this time.  Patient had Total Mastectomy Right 05/21/17. She has 2 drains in place.  Drain 1- emptied 15 cc this morning. Drain 2- emptied 7 cc this morning, however she describe dark red blood at the top of the drain tube. Unclear as to weather  or not may be a clot blocking the tubing.   She stated the color of drainage was light pinkish yesterday and a little brighter red today. She denies having any fluid build up under her incisions, she states she was concerned due to the dark red blood at the top of her tubing on drain 2. She denies fever, chills. I let her know I would speak to Dr.Davis regarding the matter and call her back to advise.

## 2017-05-27 NOTE — Telephone Encounter (Signed)
Spoke with Dr.davis and patient advised to continue keeping record of drainage and follow up on 05/29/17. Patient denies fever or redness around drain site.

## 2017-05-28 ENCOUNTER — Ambulatory Visit: Payer: Self-pay | Admitting: Surgery

## 2017-05-28 ENCOUNTER — Other Ambulatory Visit: Payer: Self-pay

## 2017-05-29 ENCOUNTER — Ambulatory Visit (INDEPENDENT_AMBULATORY_CARE_PROVIDER_SITE_OTHER): Payer: Medicaid Other | Admitting: Surgery

## 2017-05-29 ENCOUNTER — Encounter: Payer: Self-pay | Admitting: Surgery

## 2017-05-29 ENCOUNTER — Encounter: Payer: Medicaid Other | Admitting: Surgery

## 2017-05-29 VITALS — Wt 350.0 lb

## 2017-05-29 DIAGNOSIS — C50911 Malignant neoplasm of unspecified site of right female breast: Secondary | ICD-10-CM

## 2017-05-29 NOTE — Patient Instructions (Signed)
Please give us a call in case you have any questions or concerns.  

## 2017-05-29 NOTE — Progress Notes (Signed)
Outpatient postop visit  05/29/2017  Michelle Mcmillan is an 47 y.o. female.    Procedure: Right total mastectomy  CC:min drainage  HPI: This patient is status post right total mastectomy for what was initially believed to be a recurrent breast cancer.  Follow-up review of pathology suggests that this is a new breast cancer close to but not at the original site. Pain is much improved.  Minimal drainage from each drain one may be clogged.  Medications reviewed.    Physical Exam:  Wt (!) 350 lb (158.8 kg)   LMP 05/14/2017 (Exact Date) Comment: tubal ligation  BMI 51.69 kg/m     PE: Morbidly obese female, wound is clean without erythema.  No drainage.  Steri-Strips are in place.  Drains are serosanguineous only and minimal drain record is reviewed.  Both drains were removed today and a dressing is placed.    Assessment/Plan:  Patient doing very well following total mastectomy for a new breast cancer less than 2 years following her first breast cancer therapy.  Her original cancer was treated with breast conservation therapy including radiation.  She has undergone a total mastectomy.  The solitary lymph node found on pathology and labeled at surgery was negative for cancer.  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 Both drains were removed today.  I will see her back in 2 weeks to reassess.  She is seeing oncology.  She has an appointment with plastic surgery in 3 weeks.  She is reminded that we are dealing with radiated skin and if this were to open up that we would have to treated as an open wound and let it heal.  This can be dealt with by plastic surgery at a later date.  No sign of infection or opening at this time however. Florene Glen, MD, FACS

## 2017-05-31 NOTE — Progress Notes (Signed)
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   INTERVAL HISTORY: Patient returns to clinic today for further evaluation and treatment planning.  Her final pathology after her recent mastectomy was determined to be a second primary and not recurrence of her initial breast cancer.  She has no neurologic complaints. She denies any recent fevers or illnesses. She has a good appetite and denies weight loss. She denies any other pain. She has no chest pain or shortness of breath. She denies any nausea, vomiting, constipation, or diarrhea. She has no urinary complaints. Patient offers no further specific complaints today.  REVIEW OF SYSTEMS:   Review of Systems  Constitutional: Negative for fever, malaise/fatigue and weight loss.  Respiratory: Negative.  Negative for cough and shortness of breath.   Cardiovascular: Negative.  Negative for chest pain and leg swelling.  Gastrointestinal: Negative.  Negative for abdominal pain.  Genitourinary: Negative.  Negative for dysuria.  Musculoskeletal: Negative.  Negative for myalgias.  Skin: Negative.  Negative for rash.  Neurological: Negative.  Negative for sensory change and weakness.  Psychiatric/Behavioral: The patient is nervous/anxious.     As per HPI. Otherwise, a complete review of systems is negative.  PAST MEDICAL HISTORY: Past Medical History:  Diagnosis Date  . Anemia    H/O  . Arthritis    RIGHT KNEE  . Breast cancer ([place]) 2017   right breast  . Cancer ([place])    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)  . GERD (gastroesophageal reflux  disease)    NO MEDS  . Headache    migraines  . Hypertension   . Last menstrual period (LMP) > 10 days ago 08/2015  . Personal history of radiation therapy     PAST SURGICAL HISTORY: Past Surgical History:  Procedure Laterality Date  . BREAST BIOPSY Right 05/08/2017   invasive mamm. ca  . BREAST EXCISIONAL BIOPSY Right 06/15/15   breast cancer  . BREAST LUMPECTOMY WITH NEEDLE LOCALIZATION Right 06/15/2015   Procedure: BREAST LUMPECTOMY WITH NEEDLE LOCALIZATION;  Surgeon: Hubbard Robinson, MD;  Location: [facility] ORS;  Service: General;  Laterality: Right;  . BREAST SURGERY    . CESAREAN SECTION  1988   x1  . COLONOSCOPY WITH PROPOFOL N/A 01/23/2017   Procedure: COLONOSCOPY WITH PROPOFOL;  Surgeon: Lin Landsman, MD;  Location: [facility] ENDOSCOPY;  Service: Gastroenterology;  Laterality: N/A;  . ESOPHAGOGASTRODUODENOSCOPY (EGD) WITH PROPOFOL N/A 01/23/2017   Procedure: ESOPHAGOGASTRODUODENOSCOPY (EGD) WITH PROPOFOL;  Surgeon: Lin Landsman, MD;  Location: [facility] ENDOSCOPY;  Service: Gastroenterology;  Laterality: N/A;  . MASS EXCISION N/A 05/20/2016   Procedure: EXCISION CHEST WALL MASS;  Surgeon: Florene Glen, MD;  Location: [facility] ORS;  Service: General;  Laterality: N/A;  . SENTINEL NODE BIOPSY Right 06/15/2015   Procedure: SENTINEL NODE BIOPSY;  Surgeon: Hubbard Robinson, MD;  Location: [facility] ORS;  Service: General;  Laterality: Right;  . TOTAL MASTECTOMY Right 05/22/2017   Procedure: TOTAL MASTECTOMY;  Surgeon: Florene Glen, MD;  Location: [facility] ORS;  Service: General;  Laterality: Right;  . TUBAL LIGATION  2004    FAMILY HISTORY Family History  Problem Relation Age  of Onset  . Hypertension Mother   . Hypertension Father   . Hypertension Sister   . Breast cancer Neg Hx        ADVANCED DIRECTIVES:    HEALTH MAINTENANCE: Social History   Tobacco Use  . Smoking status: Never Smoker  . Smokeless tobacco: Never Used  Substance Use Topics  . Alcohol use: Yes      Comment: WINE ONCE MONTH  . Drug use: No    Allergies  Allergen Reactions  . Nitroglycerin Other (See Comments)    headache    Current Outpatient Medications  Medication Sig Dispense Refill  . benazepril-hydrochlorthiazide (LOTENSIN HCT) 20-25 MG tablet Take by mouth.    . Calcium Carb-Cholecalciferol (CALCIUM-VITAMIN D) 500-200 MG-UNIT tablet Take by mouth.    . calcium-vitamin D (OSCAL WITH D) 500-200 MG-UNIT tablet Take 1 tablet by mouth 2 (two) times daily.    Marland Kitchen exemestane (AROMASIN) 25 MG tablet Take 1 tablet (25 mg total) by mouth daily after breakfast. 30 tablet 6  . Ferrous Sulfate (IRON) 325 (65 Fe) MG TABS Take 1 tablet (325 mg total) by mouth 2 (two) times daily. 60 each 2  . hydrochlorothiazide (HYDRODIURIL) 25 MG tablet Take 25 mg by mouth daily.    Marland Kitchen HYDROcodone-acetaminophen (NORCO/VICODIN) 5-325 MG tablet Take 1-2 tablets by mouth every 4 (four) hours as needed for moderate pain. 30 tablet 0  . omeprazole (PRILOSEC) 20 MG capsule Take 1 capsule (20 mg total) by mouth daily. 30 capsule 3  . ondansetron (ZOFRAN) 4 MG tablet Take 1 tablet (4 mg total) by mouth every 8 (eight) hours as needed for nausea or vomiting. 20 tablet 0  . tetrahydrozoline 0.05 % ophthalmic solution Place 2 drops into both eyes daily.    Marland Kitchen ibuprofen (ADVIL,MOTRIN) 800 MG tablet Take 800 mg by mouth every 8 (eight) hours as needed for headache or moderate pain.     . Liniments (BLUE-EMU SUPER STRENGTH EX) Apply 1 application topically daily as needed (for pain).     No current facility-administered medications for this visit.     OBJECTIVE: Vitals:   06/02/17 1439  BP: (!) 137/94  Pulse: 76  Resp: 18  Temp: (!) 97.5 F (36.4 C)     Body mass index is 50.98 kg/m.    ECOG FS:0 - Asymptomatic  General: Well-developed, well-nourished, no acute distress. Eyes: Pink conjunctiva, anicteric sclera. Breasts: Right mastectomy. Lungs: Clear to auscultation bilaterally. Heart: Regular rate and  rhythm. No rubs, murmurs, or gallops. Abdomen: Soft, nontender, nondistended. No organomegaly noted, normoactive bowel sounds. Musculoskeletal: No edema, cyanosis, or clubbing. Neuro: Alert, answering all questions appropriately. Cranial nerves grossly intact. Skin: No rashes or petechiae noted. Psych: Normal affect.   LAB RESULTS:  Lab Results  Component Value Date   NA 141 05/22/2017   K 3.6 05/22/2017   CL 103 05/22/2017   CO2 29 05/22/2017   GLUCOSE 95 05/22/2017   BUN 17 05/22/2017   CREATININE 1.11 (H) 05/22/2017   CALCIUM 8.6 (L) 05/22/2017   PROT 8.0 05/12/2017   ALBUMIN 3.7 05/12/2017   AST 25 05/12/2017   ALT 28 05/12/2017   ALKPHOS 87 05/12/2017   BILITOT 0.6 05/12/2017   GFRNONAA 58 (L) 05/22/2017   GFRAA >60 05/22/2017    Lab Results  Component Value Date   WBC 6.4 05/23/2017   NEUTROABS 3.7 05/22/2017   HGB 11.3 (L) 05/23/2017   HCT 35.0 05/23/2017   MCV 79.8 (L) 05/23/2017   PLT 223 05/23/2017  STUDIES: Dg Chest 2 View  Result Date: 05/13/2017 CLINICAL DATA:  recurrent breast cancer EXAM: CHEST  2 VIEW COMPARISON:  08/06/2015 FINDINGS: Normal heart size. Lungs clear. No pneumothorax. No pleural effusion. IMPRESSION: No active cardiopulmonary disease. Electronically Signed   By: Marybelle Killings M.D.   On: 05/13/2017 07:49   Nm Bone Scan Whole Body  Result Date: 05/19/2017 CLINICAL DATA:  47 year old female with breast cancer. Pain left lower leg and right upper arm. Subsequent encounter. EXAM: NUCLEAR MEDICINE WHOLE BODY BONE SCAN TECHNIQUE: Whole body anterior and posterior images were obtained approximately 3 hours after intravenous injection of radiopharmaceutical. RADIOPHARMACEUTICALS:  22.71 mCi Technetium-39mMDP IV COMPARISON:  05/16/2017 CT abdomen and pelvis. 05/12/2017 chest x-ray. No comparison chest CT. FINDINGS: Radiotracer uptake surrounding the shoulders, sternoclavicular junction, elbows, wrists, sacroiliac joints, knees, tibial tubercles,  ankles and feet bilaterally suggestive of degenerative changes. Minimal increased radiotracer uptake proximal humerus near the deltoid insertion site similar bilaterally. Slight increased radiotracer uptake lower sternum near the xiphoid. No discrete abnormality noted on recent chest x-ray. This may reflect mild degenerative changes. Attention to this on follow-up. No other findings to suggest osseous metastatic disease. Both kidneys visualized. IMPRESSION: Slight increased radiotracer uptake lower sternum near the xiphoid. No discrete abnormality noted on recent chest x-ray. This may reflect mild degenerative changes. Attention to this on follow-up. Minimal increased radiotracer uptake proximal humerus near the deltoid insertion site appears similar bilaterally and most likely a normal finding. Other areas of radiotracer uptake as detailed above consistent with degenerative changes without findings of osseous metastatic disease. Electronically Signed   By: SGenia DelM.D.   On: 05/19/2017 07:43   Ct Abdomen Pelvis W Contrast  Result Date: 05/17/2017 CLINICAL DATA:  Recurrent breast cancer. EXAM: CT ABDOMEN AND PELVIS WITH CONTRAST TECHNIQUE: Multidetector CT imaging of the abdomen and pelvis was performed using the standard protocol following bolus administration of intravenous contrast. CONTRAST:  1214mISOVUE-300 IOPAMIDOL (ISOVUE-300) INJECTION 61% COMPARISON:  None. FINDINGS: Lower chest: Lung bases show no acute findings. Heart is at the upper limits of normal in size. No pericardial or pleural effusion. Hepatobiliary: Low-attenuation lesions in the liver measure up to 1.6 cm, too small to characterize. Liver and gallbladder are otherwise unremarkable. No biliary ductal dilatation. Pancreas: Negative. Spleen: Negative. Adrenals/Urinary Tract: Adrenal glands and kidneys are unremarkable. Ureters are decompressed. Bladder is grossly unremarkable. Stomach/Bowel: Stomach, small bowel, appendix and colon are  unremarkable. Vascular/Lymphatic: Circumaortic left renal vein. Vascular structures are otherwise unremarkable. No pathologically enlarged lymph nodes. Reproductive: Uterus and ovaries are visualized. Other: No free fluid.  Mesenteries and peritoneum are unremarkable. Musculoskeletal: No worrisome lytic or sclerotic lesions. IMPRESSION: No evidence of metastatic disease. Electronically Signed   By: MeLorin Picket.D.   On: 05/17/2017 07:08   [employer] Uni Right Inc Axilla  Result Date: 05/06/2017 CLINICAL DATA:  History of right breast cancer post lumpectomy 06/15/2015 followed by radiation therapy. EXAM: 2D DIGITAL DIAGNOSTIC BILATERAL MAMMOGRAM WITH CAD AND ADJUNCT TOMO RIGHT BREAST ULTRASOUND COMPARISON:  Previous exam(s). ACR Breast Density Category b: There are scattered areas of fibroglandular density. FINDINGS: There is a mass with spiculated margins in the upper slightly inner right breast measuring approximately 1 cm. Stable lumpectomy changes are present in the upper-outer posterior right breast. No suspicious masses or calcifications are seen in the left breast. Mammographic images were processed with CAD. Physical examination of the far upper right breast does not definitely reveal any palpable masses. Targeted ultrasound of the  right breast was performed demonstrating an oval hypoechoic mass with slight margin irregularity at 12 o'clock 8 cm from nipple measuring 1 x 1 x 0.9 cm. This corresponds well with the mass seen in the upper right breast at mammography. No definite lymphadenopathy seen in the right axilla. IMPRESSION: Suspicious right breast mass. RECOMMENDATION: Ultrasound-guided biopsy of the mass in the right breast at the 12 o'clock position is recommended. This will be arranged for the patient. I have discussed the findings and recommendations with the patient. Results were also provided in writing at the conclusion of the visit. If applicable, a reminder letter will be sent to the  patient regarding the next appointment. BI-RADS CATEGORY  4: Suspicious. Electronically Signed   By: Everlean Alstrom M.D.   On: 05/06/2017 11:59   Mm Diag Breast Tomo Bilateral  Result Date: 05/06/2017 CLINICAL DATA:  History of right breast cancer post lumpectomy 06/15/2015 followed by radiation therapy. EXAM: 2D DIGITAL DIAGNOSTIC BILATERAL MAMMOGRAM WITH CAD AND ADJUNCT TOMO RIGHT BREAST ULTRASOUND COMPARISON:  Previous exam(s). ACR Breast Density Category b: There are scattered areas of fibroglandular density. FINDINGS: There is a mass with spiculated margins in the upper slightly inner right breast measuring approximately 1 cm. Stable lumpectomy changes are present in the upper-outer posterior right breast. No suspicious masses or calcifications are seen in the left breast. Mammographic images were processed with CAD. Physical examination of the far upper right breast does not definitely reveal any palpable masses. Targeted ultrasound of the right breast was performed demonstrating an oval hypoechoic mass with slight margin irregularity at 12 o'clock 8 cm from nipple measuring 1 x 1 x 0.9 cm. This corresponds well with the mass seen in the upper right breast at mammography. No definite lymphadenopathy seen in the right axilla. IMPRESSION: Suspicious right breast mass. RECOMMENDATION: Ultrasound-guided biopsy of the mass in the right breast at the 12 o'clock position is recommended. This will be arranged for the patient. I have discussed the findings and recommendations with the patient. Results were also provided in writing at the conclusion of the visit. If applicable, a reminder letter will be sent to the patient regarding the next appointment. BI-RADS CATEGORY  4: Suspicious. Electronically Signed   By: Everlean Alstrom M.D.   On: 05/06/2017 11:59   Mm Clip Placement Right  Result Date: 05/08/2017 CLINICAL DATA:  47 year old patient with suspicious mass in the 12 12:30 position of the right breast for  which ultrasound-guided biopsy was recommended. She has a history of right breast cancer at the 10 o'clock position diagnosed in February 2017. EXAM: DIAGNOSTIC RIGHT MAMMOGRAM POST ULTRASOUND BIOPSY COMPARISON:  Previous exam(s). FINDINGS: Mammographic images were obtained following ultrasound guided biopsy of a suspicious mass at the 12 to 12:30 position 8 cm from the nipple in the right breast. Coil shaped biopsy clip is 5 mm directly anterior to the biopsied mass. IMPRESSION: Coil shaped biopsy clip 5 mm directly anterior to the biopsied mass. Final Assessment: Post Procedure Mammograms for Marker Placement Electronically Signed   By: Curlene Dolphin M.D.   On: 05/08/2017 13:44   [place] Rt Breast Bx W Loc Dev 1st Lesion Img Bx Spec US Guide  Addendum Date: 05/22/2017   ADDENDUM REPORT: 05/20/2017 14:42 ADDENDUM: Pathology of the right breast biopsy revealed A. BREAST, RIGHT, 8 CM FROM THE NIPPLE; ULTRASOUND GUIDED BIOPSY: INVASIVE MAMMARY CARCINOMA, NO SPECIAL TYPE. Size of invasive carcinoma: 9 mm in this sample. Histologic grade of invasive carcinoma: overall grade 2. 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 Lymphovascular invasion: Not identified. ER/PR/HER2: Immunohistochemistry will be performed on block A1 with reflex to [place] for HER2 2+. The results will be reported in an addendum. Comment: The definitive grade will be assigned on the excisional specimen. These findings were communicated to [place] in Dr. Theodosia Blender office on 05/09/2017. Read back procedure was performed. This was found to be concordant by Dr. Radford Pax. Recommendation: Follow-up with Dr. Burt Knack and oncology. Dr. Antionette Char office was contacted by phone by Jetta Lout, [place] on 05/12/17. The receptionist stated that the patient does have a follow-up appointment in 2 weeks. She will inform Dr. Burt Knack and his nurse that the results are available. They will contact the patient with results and appointment  information. Attempts to contact the patient for a biopsy site check by Jetta Lout, RRA were unsuccessful. Per notes in Epic, the patient did see Dr. Grayland Ormond on 05/15/17 and Dr. Burt Knack on 05/19/17. Addendum by Jetta Lout, RRA on 05/20/17. Electronically Signed   By: Curlene Dolphin M.D.   On: 05/20/2017 14:42   Result Date: 05/22/2017 CLINICAL DATA:  47 year old patient with suspicious mass in the 12 o'clock to 12:30 position of the right breast. Ultrasound-guided biopsy was recommended. The patient has a history of right breast cancer diagnosed in the 10 o'clock position in 2017. EXAM: ULTRASOUND GUIDED RIGHT BREAST CORE NEEDLE BIOPSY COMPARISON:  Previous exam(s). FINDINGS: I met with the patient and we discussed the procedure of ultrasound-guided biopsy, including benefits and alternatives. We discussed the high likelihood of a successful procedure. We discussed the risks of the procedure, including infection, bleeding, tissue injury, clip migration, and inadequate sampling. Informed written consent was given. The usual time-out protocol was performed immediately prior to the procedure. Lesion quadrant: Lower inner quadrant Using sterile technique and 1% Lidocaine as local anesthetic, under direct ultrasound visualization, a 12 gauge spring-loaded device was used to perform biopsy of a 1.0 cm suspicious mass using a lateral to medial approach. At the conclusion of the procedure a coil tissue marker clip was deployed into the biopsy cavity. Follow up 2 view mammogram was performed and dictated separately. IMPRESSION: Ultrasound guided biopsy of the right breast. No apparent complications. Electronically Signed: By: Curlene Dolphin M.D. 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 BRCA 1 and 2 negative, Oncotype DX score 17 which is considered low risk.  PLAN:    1. Pathologic stage Ia ER/PR positive,  HER-2 negative adenocarcinoma of the upper outer quadrant of the right breast: Pathology from mastectomy specimen on May 22, 2017 indicates that this is most likely a second primary and not a local recurrence of disease.  This occurred while patient was taking Aromasin, therefore will likely switch to letrozole after completion of chemotherapy.  Oncotype was not sent on her second breast cancer.  After lengthy discussion with the patient, it was agreed upon that we will proceed with adjuvant chemotherapy using 4 cycles of Taxotere and Cytoxan every 3 weeks.  CT scans and bone scans reviewed independently and report as above with no evidence of metastatic disease confirming stage I malignancy.  Return to clinic on June 24, 2017 for further evaluation and consideration of cycle 1 of Taxotere and Cytoxan.   2. Genetic testing: Patient was found to be BRCA 1 and 2 negative. She expressed understanding that although her genetic testing was negative, her children are at higher  risk for breast cancer than the general population.  3. Bone mineral density: Bone density from September 04, 2016 reported Z score of 1.0 which is considered normal. Repeat in June 2020.  Approximately 30 minutes was spent in discussion of which greater than 50% was consultation.   Patient expressed understanding and was in agreement with this plan. She also understands that She can call clinic at any time with any questions, concerns, or complaints.    Lloyd Huger, MD 06/03/17 5:05 PM

## 2017-06-02 ENCOUNTER — Other Ambulatory Visit: Payer: Self-pay

## 2017-06-02 ENCOUNTER — Inpatient Hospital Stay: Payer: Medicaid Other | Attending: Oncology | Admitting: Oncology

## 2017-06-02 VITALS — BP 137/94 | HR 76 | Temp 97.5°F | Resp 18 | Wt 345.2 lb

## 2017-06-02 DIAGNOSIS — Z170 Estrogen receptor positive status [ER+]: Secondary | ICD-10-CM | POA: Insufficient documentation

## 2017-06-02 DIAGNOSIS — I10 Essential (primary) hypertension: Secondary | ICD-10-CM | POA: Diagnosis not present

## 2017-06-02 DIAGNOSIS — C50412 Malignant neoplasm of upper-outer quadrant of left female breast: Secondary | ICD-10-CM | POA: Insufficient documentation

## 2017-06-02 DIAGNOSIS — C50411 Malignant neoplasm of upper-outer quadrant of right female breast: Secondary | ICD-10-CM | POA: Diagnosis not present

## 2017-06-02 DIAGNOSIS — Z5111 Encounter for antineoplastic chemotherapy: Secondary | ICD-10-CM | POA: Insufficient documentation

## 2017-06-02 NOTE — Progress Notes (Signed)
Here for follow up. Spoke of pain c/o w R breast- taking pain meds w effect.

## 2017-06-03 MED ORDER — PROCHLORPERAZINE MALEATE 10 MG PO TABS: 10.0000 mg | ORAL_TABLET | Freq: Four times a day (QID) | ORAL | 2 refills | Status: DC | PRN

## 2017-06-03 MED ORDER — ONDANSETRON HCL 8 MG PO TABS: 8.0000 mg | ORAL_TABLET | Freq: Two times a day (BID) | ORAL | 2 refills | Status: DC | PRN

## 2017-06-03 NOTE — Progress Notes (Signed)
START ON PATHWAY REGIMEN - Breast     A cycle is every 21 days:     Docetaxel      Cyclophosphamide   **Always confirm dose/schedule in your pharmacy ordering system**    Patient Characteristics: Postoperative without Neoadjuvant Therapy (Pathologic Staging), Invasive Disease, Adjuvant Therapy, HER2 Negative/Unknown/Equivocal, ER Positive, Node Negative, pT1a-c, pN0/N93m or pT2 or Higher, pN0, Genomic Testing Not Performed, Chemotherapy Preferred Therapeutic Status: Postoperative without Neoadjuvant Therapy (Pathologic Staging) AJCC Grade: G2 AJCC N Category: pN0 AJCC M Category: cM0 ER Status: Positive (+) AJCC 8 Stage Grouping: IA HER2 Status: Negative (-) Oncotype Dx Recurrence Score: Not Appropriate AJCC T Category: pT1c PR Status: Positive (+) Has this patient completed genomic testing<= No - Did Not Order Test  Treatment Preferred: Chemotherapy Intent of Therapy: Curative Intent, Discussed with Patient

## 2017-06-05 NOTE — Patient Instructions (Signed)

## 2017-06-06 ENCOUNTER — Ambulatory Visit: Payer: Medicaid Other | Admitting: Radiation Oncology

## 2017-06-10 ENCOUNTER — Other Ambulatory Visit: Payer: Self-pay | Admitting: *Deleted

## 2017-06-10 ENCOUNTER — Inpatient Hospital Stay: Payer: Medicaid Other

## 2017-06-10 MED ORDER — HYDROCODONE-ACETAMINOPHEN 5-325 MG PO TABS: 1.0000 | ORAL_TABLET | ORAL | 0 refills | Status: DC | PRN

## 2017-06-12 ENCOUNTER — Encounter: Payer: Self-pay | Admitting: Surgery

## 2017-06-12 ENCOUNTER — Ambulatory Visit (INDEPENDENT_AMBULATORY_CARE_PROVIDER_SITE_OTHER): Payer: Medicaid Other | Admitting: Surgery

## 2017-06-12 ENCOUNTER — Other Ambulatory Visit: Payer: Medicaid Other

## 2017-06-12 ENCOUNTER — Ambulatory Visit: Payer: Medicaid Other

## 2017-06-12 VITALS — BP 136/96 | HR 81 | Temp 98.1°F | Ht 69.0 in | Wt 347.0 lb

## 2017-06-12 DIAGNOSIS — C50911 Malignant neoplasm of unspecified site of right female breast: Secondary | ICD-10-CM

## 2017-06-12 NOTE — Patient Instructions (Signed)
You may apply Maderma or Vitamin E to the incision area to help with the scarring. These can be purchased over the counter.  Please see your follow up appointment listed below.

## 2017-06-12 NOTE — Progress Notes (Signed)
Outpatient postop visit  06/12/2017  Michelle Mcmillan is an 47 y.o. female.    Procedure: Mastectomy  CC: No complaints  HPI: Patient feels very well at this time she has no complaints and is eating and having no problems with her mastectomy site.  Medications reviewed.    Physical Exam:  LMP 05/14/2017 (Exact Date) Comment: tubal ligation    PE: Wound is clean no erythema no drainage well-healed flat and no sign of dehiscence.    Assessment/Plan:  Patient doing very well at this time recommend the use of Mederma or vitamin E cream.  She is slated to start her 4 doses of chemotherapy in the near future.  I suggested that if she needed a port that can be placed at any time but as I had discussed with Dr. Grayland Ormond that it is unlikely that she would require a port.  She will follow-up with me in 1 month and then we can start talking about timing and seeing plastic surgery to discuss prophylactic mastectomy on the left and bilateral reconstruction.  Florene Glen, MD, FACS

## 2017-06-22 NOTE — Progress Notes (Signed)
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   INTERVAL HISTORY: Patient returns to clinic today for further evaluation and consideration of cycle 1 of Taxotere and Cytoxan.  She is anxious, but otherwise feels well.  She has no neurologic complaints. She denies any recent fevers or illnesses. She has a good appetite and denies weight loss. She has no chest pain or shortness of breath. She denies any nausea, vomiting, constipation, or diarrhea. She has no urinary complaints. Patient offers no specific complaints today.  REVIEW OF SYSTEMS:   Review of Systems  Constitutional: Negative.  Negative for fever, malaise/fatigue and weight loss.  Respiratory: Negative.  Negative for cough and shortness of breath.   Cardiovascular: Negative.  Negative for chest pain and leg swelling.  Gastrointestinal: Negative.  Negative for abdominal pain.  Genitourinary: Negative.  Negative for dysuria.  Musculoskeletal: Negative.  Negative for myalgias.  Skin: Negative.  Negative for rash.  Neurological: Negative.  Negative for sensory change and weakness.  Psychiatric/Behavioral: The patient is nervous/anxious.     As per HPI. Otherwise, a complete review of systems is negative.  PAST MEDICAL HISTORY: Past Medical History:  Diagnosis Date  . Anemia    H/O  . Arthritis    RIGHT KNEE  . Breast cancer ([place]) 2017   right breast  . Cancer ([place])    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)  . GERD (gastroesophageal reflux disease)    NO MEDS  . Headache    migraines  . Hypertension   . Last menstrual  period (LMP) > 10 days ago 08/2015  . Personal history of radiation therapy     PAST SURGICAL HISTORY: Past Surgical History:  Procedure Laterality Date  . BREAST BIOPSY Right 05/08/2017   invasive mamm. ca  . BREAST EXCISIONAL BIOPSY Right 06/15/15   breast cancer  . BREAST LUMPECTOMY WITH NEEDLE LOCALIZATION Right 06/15/2015   Procedure: BREAST LUMPECTOMY WITH NEEDLE LOCALIZATION;  Surgeon: Hubbard Robinson, MD;  Location: [facility] ORS;  Service: General;  Laterality: Right;  . BREAST SURGERY    . CESAREAN SECTION  1988   x1  . COLONOSCOPY WITH PROPOFOL N/A 01/23/2017   Procedure: COLONOSCOPY WITH PROPOFOL;  Surgeon: Lin Landsman, MD;  Location: [facility] ENDOSCOPY;  Service: Gastroenterology;  Laterality: N/A;  . ESOPHAGOGASTRODUODENOSCOPY (EGD) WITH PROPOFOL N/A 01/23/2017   Procedure: ESOPHAGOGASTRODUODENOSCOPY (EGD) WITH PROPOFOL;  Surgeon: Lin Landsman, MD;  Location: [facility] ENDOSCOPY;  Service: Gastroenterology;  Laterality: N/A;  . MASS EXCISION N/A 05/20/2016   Procedure: EXCISION CHEST WALL MASS;  Surgeon: Florene Glen, MD;  Location: [facility] ORS;  Service: General;  Laterality: N/A;  . SENTINEL NODE BIOPSY Right 06/15/2015   Procedure: SENTINEL NODE BIOPSY;  Surgeon: Hubbard Robinson, MD;  Location: [facility] ORS;  Service: General;  Laterality: Right;  . TOTAL MASTECTOMY Right 05/22/2017   Procedure: TOTAL MASTECTOMY;  Surgeon: Florene Glen, MD;  Location: [facility] ORS;  Service: General;  Laterality: Right;  . TUBAL LIGATION  2004    FAMILY HISTORY Family History  Problem Relation Age of Onset  . Hypertension Mother   . Hypertension Father   .  Hypertension Sister   . Breast cancer Neg Hx        ADVANCED DIRECTIVES:    HEALTH MAINTENANCE: Social History   Tobacco Use  . Smoking status: Never Smoker  . Smokeless tobacco: Never Used  Substance Use Topics  . Alcohol use: Yes    Comment: WINE ONCE MONTH  . Drug use: No    Allergies  Allergen Reactions    . Nitroglycerin Other (See Comments)    headache    Current Outpatient Medications  Medication Sig Dispense Refill  . Calcium Carb-Cholecalciferol (CALCIUM-VITAMIN D) 500-200 MG-UNIT tablet Take by mouth.    Marland Kitchen exemestane (AROMASIN) 25 MG tablet Take 1 tablet (25 mg total) by mouth daily after breakfast. 30 tablet 6  . Ferrous Sulfate (IRON) 325 (65 Fe) MG TABS Take 1 tablet (325 mg total) by mouth 2 (two) times daily. 60 each 2  . hydrochlorothiazide (HYDRODIURIL) 25 MG tablet Take 25 mg by mouth daily.    Marland Kitchen HYDROcodone-acetaminophen (NORCO/VICODIN) 5-325 MG tablet Take 1-2 tablets by mouth every 4 (four) hours as needed for moderate pain. 30 tablet 0  . ibuprofen (ADVIL,MOTRIN) 800 MG tablet Take 800 mg by mouth every 8 (eight) hours as needed for headache or moderate pain.     . Liniments (BLUE-EMU SUPER STRENGTH EX) Apply 1 application topically daily as needed (for pain).    Marland Kitchen omeprazole (PRILOSEC) 20 MG capsule Take 1 capsule (20 mg total) by mouth daily. 30 capsule 3  . ondansetron (ZOFRAN) 4 MG tablet Take 1 tablet (4 mg total) by mouth every 8 (eight) hours as needed for nausea or vomiting. 20 tablet 0  . ondansetron (ZOFRAN) 8 MG tablet Take 1 tablet (8 mg total) by mouth 2 (two) times daily as needed for refractory nausea / vomiting. 30 tablet 2  . prochlorperazine (COMPAZINE) 10 MG tablet Take 1 tablet (10 mg total) by mouth every 6 (six) hours as needed (Nausea or vomiting). 60 tablet 2  . tetrahydrozoline 0.05 % ophthalmic solution Place 2 drops into both eyes daily.     No current facility-administered medications for this visit.     OBJECTIVE: Vitals:   06/24/17 0905  BP: 122/76  Pulse: 93  Temp: (!) 96.8 F (36 C)     Body mass index is 51.83 kg/m.    ECOG FS:0 - Asymptomatic  General: Well-developed, well-nourished, no acute distress. Eyes: Pink conjunctiva, anicteric sclera. Breasts: Right mastectomy. Lungs: Clear to auscultation bilaterally. Heart: Regular  rate and rhythm. No rubs, murmurs, or gallops. Abdomen: Soft, nontender, nondistended. No organomegaly noted, normoactive bowel sounds. Musculoskeletal: No edema, cyanosis, or clubbing. Neuro: Alert, answering all questions appropriately. Cranial nerves grossly intact. Skin: No rashes or petechiae noted. Psych: Normal affect.   LAB RESULTS:  Lab Results  Component Value Date   NA 139 06/24/2017   K 3.3 (L) 06/24/2017   CL 101 06/24/2017   CO2 27 06/24/2017   GLUCOSE 147 (H) 06/24/2017   BUN 13 06/24/2017   CREATININE 0.91 06/24/2017   CALCIUM 8.9 06/24/2017   PROT 7.4 06/24/2017   ALBUMIN 3.3 (L) 06/24/2017   AST 24 06/24/2017   ALT 22 06/24/2017   ALKPHOS 100 06/24/2017   BILITOT 0.5 06/24/2017   GFRNONAA >60 06/24/2017   GFRAA >60 06/24/2017    Lab Results  Component Value Date   WBC 6.7 06/24/2017   NEUTROABS 4.8 06/24/2017   HGB 12.2 06/24/2017   HCT 37.7 06/24/2017   MCV 79.9 (L) 06/24/2017  PLT 282 06/24/2017     STUDIES: No results found.  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 BRCA 1 and 2 negative, Oncotype DX score 17 which is considered low risk.  PLAN:    1. 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  This occurred while patient was taking Aromasin, therefore will likely switch to letrozole after completion of chemotherapy.  Oncotype was not sent on her second breast cancer.  After lengthy discussion with the patient, it was agreed upon that we will proceed with adjuvant chemotherapy using 4 cycles of Taxotere and Cytoxan every 3 weeks.  CT scans and bone scans reviewed independently and report as above with no evidence of metastatic disease confirming  stage I malignancy.  Proceed with cycle 1 for Taxotere and Cytoxan today.  Return to clinic in 1 week for laboratory work and further evaluation and then in 3 weeks for sideration of cycle 2.   2. Genetic testing: Patient was found to be BRCA 1 and 2 negative. She expressed understanding that although her genetic testing was negative, her children are at higher risk for breast cancer than the general population.   3. Bone mineral density: Bone density from September 04, 2016 reported Z score of 1.0 which is considered normal. Repeat in June 2020. 4.  Hypokalemia: Mild, monitor.  Approximately 30 minutes was spent in discussion of which greater than 50% was consultation.   Patient expressed understanding and was in agreement with this plan. She also understands that She can call clinic at any time with any questions, concerns, or complaints.    Lloyd Huger, MD 06/24/17 9:24 AM

## 2017-06-24 ENCOUNTER — Ambulatory Visit: Payer: Medicaid Other | Admitting: Oncology

## 2017-06-24 ENCOUNTER — Inpatient Hospital Stay: Payer: Medicaid Other

## 2017-06-24 ENCOUNTER — Encounter: Payer: Self-pay | Admitting: Oncology

## 2017-06-24 ENCOUNTER — Inpatient Hospital Stay (HOSPITAL_BASED_OUTPATIENT_CLINIC_OR_DEPARTMENT_OTHER): Payer: Medicaid Other | Admitting: Oncology

## 2017-06-24 ENCOUNTER — Other Ambulatory Visit: Payer: Self-pay

## 2017-06-24 VITALS — BP 130/93 | HR 70 | Resp 20

## 2017-06-24 VITALS — BP 122/76 | HR 93 | Temp 96.8°F | Wt 351.0 lb

## 2017-06-24 DIAGNOSIS — Z170 Estrogen receptor positive status [ER+]: Principal | ICD-10-CM

## 2017-06-24 DIAGNOSIS — R450 Nervousness: Secondary | ICD-10-CM

## 2017-06-24 DIAGNOSIS — I10 Essential (primary) hypertension: Secondary | ICD-10-CM

## 2017-06-24 DIAGNOSIS — E876 Hypokalemia: Secondary | ICD-10-CM

## 2017-06-24 DIAGNOSIS — Z9011 Acquired absence of right breast and nipple: Secondary | ICD-10-CM

## 2017-06-24 DIAGNOSIS — C50411 Malignant neoplasm of upper-outer quadrant of right female breast: Secondary | ICD-10-CM | POA: Diagnosis not present

## 2017-06-24 DIAGNOSIS — Z5111 Encounter for antineoplastic chemotherapy: Secondary | ICD-10-CM | POA: Diagnosis not present

## 2017-06-24 LAB — COMPREHENSIVE METABOLIC PANEL
ALBUMIN: 3.3 g/dL — AB (ref 3.5–5.0)
ALT: 22 U/L (ref 14–54)
ANION GAP: 11 (ref 5–15)
AST: 24 U/L (ref 15–41)
Alkaline Phosphatase: 100 U/L (ref 38–126)
BILIRUBIN TOTAL: 0.5 mg/dL (ref 0.3–1.2)
BUN: 13 mg/dL (ref 6–20)
CHLORIDE: 101 mmol/L (ref 101–111)
CO2: 27 mmol/L (ref 22–32)
Calcium: 8.9 mg/dL (ref 8.9–10.3)
Creatinine, Ser: 0.91 mg/dL (ref 0.44–1.00)
GFR calc Af Amer: >60 mL/min (ref >60)
GFR calc non Af Amer: >60 mL/min (ref >60)
GLUCOSE: 147 mg/dL — AB (ref 65–99)
POTASSIUM: 3.3 mmol/L — AB (ref 3.5–5.1)
SODIUM: 139 mmol/L (ref 135–145)
Total Protein: 7.4 g/dL (ref 6.5–8.1)

## 2017-06-24 LAB — CBC WITH DIFFERENTIAL/PLATELET
BASOS ABS: 0 10*3/uL (ref 0–0.1)
Basophils Relative: 1 %
EOS ABS: 0.1 10*3/uL (ref 0–0.7)
EOS PCT: 2 %
HCT: 37.7 % (ref 35.0–47.0)
Hemoglobin: 12.2 g/dL (ref 12.0–16.0)
Lymphocytes Relative: 20 %
Lymphs Abs: 1.3 10*3/uL (ref 1.0–3.6)
MCH: 26 pg (ref 26.0–34.0)
MCHC: 32.5 g/dL (ref 32.0–36.0)
MCV: 79.9 fL — ABNORMAL LOW (ref 80.0–100.0)
MONO ABS: 0.4 10*3/uL (ref 0.2–0.9)
MONOS PCT: 6 %
NEUTROS ABS: 4.8 10*3/uL (ref 1.4–6.5)
Neutrophils Relative %: 71 %
PLATELETS: 282 10*3/uL (ref 150–440)
RBC: 4.71 MIL/uL (ref 3.80–5.20)
RDW: 17.7 % — ABNORMAL HIGH (ref 11.5–14.5)
WBC: 6.7 10*3/uL (ref 3.6–11.0)

## 2017-06-24 MED ORDER — SODIUM CHLORIDE 0.9 % IV SOLN
75.0000 mg/m2 | Freq: Once | INTRAVENOUS | Status: AC
  Administered 2017-06-24: 210 mg via INTRAVENOUS
  Filled 2017-06-24: qty 21

## 2017-06-24 MED ORDER — DEXAMETHASONE SODIUM PHOSPHATE 10 MG/ML IJ SOLN
10.0000 mg | Freq: Once | INTRAMUSCULAR | Status: AC
  Administered 2017-06-24: 10 mg via INTRAVENOUS
  Filled 2017-06-24: qty 1

## 2017-06-24 MED ORDER — PEGFILGRASTIM 6 MG/0.6ML ~~LOC~~ PSKT
6.0000 mg | PREFILLED_SYRINGE | Freq: Once | SUBCUTANEOUS | Status: AC
  Administered 2017-06-24: 6 mg via SUBCUTANEOUS
  Filled 2017-06-24: qty 0.6

## 2017-06-24 MED ORDER — SODIUM CHLORIDE 0.9 % IV SOLN
Freq: Once | INTRAVENOUS | Status: AC
Start: 2017-06-24 — End: 2017-06-24
  Administered 2017-06-24: 10:00:00 via INTRAVENOUS
  Filled 2017-06-24: qty 1000

## 2017-06-24 MED ORDER — SODIUM CHLORIDE 0.9 % IV SOLN: 10.0000 mg | Freq: Once | INTRAVENOUS | Status: DC

## 2017-06-24 MED ORDER — SODIUM CHLORIDE 0.9 % IV SOLN
600.0000 mg/m2 | Freq: Once | INTRAVENOUS | Status: AC
  Administered 2017-06-24: 1660 mg via INTRAVENOUS
  Filled 2017-06-24: qty 83

## 2017-06-24 MED ORDER — PALONOSETRON HCL INJECTION 0.25 MG/5ML
0.2500 mg | Freq: Once | INTRAVENOUS | Status: AC
  Administered 2017-06-24: 0.25 mg via INTRAVENOUS
  Filled 2017-06-24: qty 5

## 2017-06-25 ENCOUNTER — Telehealth: Payer: Self-pay | Admitting: *Deleted

## 2017-06-25 ENCOUNTER — Other Ambulatory Visit: Payer: Self-pay | Admitting: *Deleted

## 2017-06-25 MED ORDER — IRON 325 (65 FE) MG PO TABS
1.0000 | ORAL_TABLET | Freq: Two times a day (BID) | ORAL | 2 refills | Status: DC
Start: 2017-06-25 — End: 2017-06-25

## 2017-06-25 MED ORDER — IRON 325 (65 FE) MG PO TABS: 1.0000 | ORAL_TABLET | Freq: Two times a day (BID) | ORAL | 2 refills | Status: AC

## 2017-06-25 NOTE — Telephone Encounter (Signed)
Patient informed of doctor response to stop aromasin but continue iron

## 2017-06-25 NOTE — Telephone Encounter (Signed)
Patient called and is asking if she is to take Aromasin and if she is to continue taking oral iron. Please advise

## 2017-06-25 NOTE — Telephone Encounter (Signed)
Patient states she needs refill of her iron. escribed to [facility] per patient request

## 2017-06-25 NOTE — Telephone Encounter (Signed)
Refill sent.

## 2017-06-25 NOTE — Telephone Encounter (Signed)
Stop Aromasin, continue iron.

## 2017-07-01 ENCOUNTER — Ambulatory Visit
Admission: RE | Admit: 2017-07-01 | Discharge: 2017-07-01 | Disposition: A | Discharge status: Home / Self Care | Payer: Medicaid Other | Source: Ambulatory Visit | Attending: Oncology | Admitting: Oncology

## 2017-07-01 ENCOUNTER — Other Ambulatory Visit: Payer: Self-pay

## 2017-07-01 ENCOUNTER — Inpatient Hospital Stay (HOSPITAL_BASED_OUTPATIENT_CLINIC_OR_DEPARTMENT_OTHER): Payer: Medicaid Other | Admitting: Oncology

## 2017-07-01 ENCOUNTER — Encounter: Payer: Self-pay | Admitting: Oncology

## 2017-07-01 ENCOUNTER — Inpatient Hospital Stay: Payer: Medicaid Other

## 2017-07-01 ENCOUNTER — Inpatient Hospital Stay: Payer: Medicaid Other | Attending: Oncology

## 2017-07-01 VITALS — BP 143/101 | HR 103 | Temp 97.7°F | Resp 16 | Wt 347.0 lb

## 2017-07-01 DIAGNOSIS — Z170 Estrogen receptor positive status [ER+]: Secondary | ICD-10-CM | POA: Diagnosis not present

## 2017-07-01 DIAGNOSIS — C50411 Malignant neoplasm of upper-outer quadrant of right female breast: Secondary | ICD-10-CM | POA: Diagnosis present

## 2017-07-01 DIAGNOSIS — R1084 Generalized abdominal pain: Secondary | ICD-10-CM | POA: Insufficient documentation

## 2017-07-01 DIAGNOSIS — R933 Abnormal findings on diagnostic imaging of other parts of digestive tract: Secondary | ICD-10-CM | POA: Diagnosis not present

## 2017-07-01 DIAGNOSIS — R000 Tachycardia, unspecified: Secondary | ICD-10-CM

## 2017-07-01 DIAGNOSIS — E876 Hypokalemia: Secondary | ICD-10-CM

## 2017-07-01 DIAGNOSIS — C50919 Malignant neoplasm of unspecified site of unspecified female breast: Secondary | ICD-10-CM

## 2017-07-01 DIAGNOSIS — R110 Nausea: Secondary | ICD-10-CM

## 2017-07-01 DIAGNOSIS — R1032 Left lower quadrant pain: Secondary | ICD-10-CM

## 2017-07-01 DIAGNOSIS — I4581 Long QT syndrome: Secondary | ICD-10-CM | POA: Diagnosis not present

## 2017-07-01 DIAGNOSIS — R1013 Epigastric pain: Secondary | ICD-10-CM

## 2017-07-01 DIAGNOSIS — K769 Liver disease, unspecified: Secondary | ICD-10-CM | POA: Insufficient documentation

## 2017-07-01 DIAGNOSIS — E860 Dehydration: Secondary | ICD-10-CM | POA: Diagnosis not present

## 2017-07-01 DIAGNOSIS — R112 Nausea with vomiting, unspecified: Secondary | ICD-10-CM

## 2017-07-01 DIAGNOSIS — R1012 Left upper quadrant pain: Secondary | ICD-10-CM | POA: Diagnosis not present

## 2017-07-01 DIAGNOSIS — C50412 Malignant neoplasm of upper-outer quadrant of left female breast: Secondary | ICD-10-CM

## 2017-07-01 DIAGNOSIS — R197 Diarrhea, unspecified: Secondary | ICD-10-CM

## 2017-07-01 DIAGNOSIS — Z5111 Encounter for antineoplastic chemotherapy: Secondary | ICD-10-CM | POA: Insufficient documentation

## 2017-07-01 LAB — CBC WITH DIFFERENTIAL/PLATELET
Basophils Absolute: 0 10*3/uL (ref 0–0.1)
Basophils Relative: 1 %
EOS PCT: 0 %
Eosinophils Absolute: 0 10*3/uL (ref 0–0.7)
HCT: 35.8 % (ref 35.0–47.0)
HEMOGLOBIN: 11.8 g/dL — AB (ref 12.0–16.0)
LYMPHS ABS: 1.6 10*3/uL (ref 1.0–3.6)
LYMPHS PCT: 16 %
MCH: 25.7 pg — AB (ref 26.0–34.0)
MCHC: 33 g/dL (ref 32.0–36.0)
MCV: 77.9 fL — AB (ref 80.0–100.0)
MONOS PCT: 6 %
Monocytes Absolute: 0.6 10*3/uL (ref 0.2–0.9)
Neutro Abs: 7.9 10*3/uL — ABNORMAL HIGH (ref 1.4–6.5)
Neutrophils Relative %: 77 %
PLATELETS: 257 10*3/uL (ref 150–440)
RBC: 4.59 MIL/uL (ref 3.80–5.20)
RDW: 17.7 % — ABNORMAL HIGH (ref 11.5–14.5)
WBC: 10.2 10*3/uL (ref 3.6–11.0)

## 2017-07-01 LAB — COMPREHENSIVE METABOLIC PANEL
ALBUMIN: 3.4 g/dL — AB (ref 3.5–5.0)
ALT: 29 U/L (ref 14–54)
AST: 33 U/L (ref 15–41)
Alkaline Phosphatase: 96 U/L (ref 38–126)
Anion gap: 12 (ref 5–15)
BUN: 15 mg/dL (ref 6–20)
CHLORIDE: 99 mmol/L — AB (ref 101–111)
CO2: 26 mmol/L (ref 22–32)
CREATININE: 1.16 mg/dL — AB (ref 0.44–1.00)
Calcium: 8.6 mg/dL — ABNORMAL LOW (ref 8.9–10.3)
GFR calc Af Amer: >60 mL/min (ref >60)
GFR calc non Af Amer: 55 mL/min — ABNORMAL LOW (ref >60)
Glucose, Bld: 163 mg/dL — ABNORMAL HIGH (ref 65–99)
POTASSIUM: 2.9 mmol/L — AB (ref 3.5–5.1)
SODIUM: 137 mmol/L (ref 135–145)
Total Bilirubin: 0.3 mg/dL (ref 0.3–1.2)
Total Protein: 7.3 g/dL (ref 6.5–8.1)

## 2017-07-01 MED ORDER — ONDANSETRON HCL 4 MG/2ML IJ SOLN
8.0000 mg | Freq: Once | INTRAMUSCULAR | Status: AC
  Administered 2017-07-01: 8 mg via INTRAVENOUS
  Filled 2017-07-01: qty 4

## 2017-07-01 MED ORDER — POTASSIUM CHLORIDE CRYS ER 20 MEQ PO TBCR: 20.0000 meq | EXTENDED_RELEASE_TABLET | Freq: Two times a day (BID) | ORAL | 0 refills | Status: DC

## 2017-07-01 MED ORDER — SODIUM CHLORIDE 0.9 % IV SOLN: Freq: Once | INTRAVENOUS | Status: DC

## 2017-07-01 MED ORDER — SODIUM CHLORIDE 0.9 % IV SOLN
INTRAVENOUS | Status: DC
  Administered 2017-07-01: 13:00:00 via INTRAVENOUS
  Filled 2017-07-01 (×2): qty 1000

## 2017-07-01 MED ORDER — IOPAMIDOL (ISOVUE-300) INJECTION 61%
125.0000 mL | Freq: Once | INTRAVENOUS | Status: AC | PRN
  Administered 2017-07-01: 125 mL via INTRAVENOUS

## 2017-07-01 NOTE — Progress Notes (Signed)
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   INTERVAL HISTORY: Patient returns to clinic today for evaluation after cycle 1 of Taxotere and Cytoxan. Patient complains of severe diarrhea. Symptoms have been present for approximately 6 days. The symptoms are gradually worsening. Stool frequency is approximately 6 per day. Patient estimates stool volume to be 1/2 to 1 cup per bowel movement. Diarrhea does occur at night. The patient has noted no bleeding associated with bowel movements. Patient also reports the following symptoms: abdominal cramping relieved by defecation, fever, joint aches, loose stools, watery diarrhea, weight loss and nausea with vomiting and one remote fever of 100.2. The patient denies the following symptoms: abdominal distension and bloating. The patient admits to abdominal pain, located in the epigastrium, in the LUQ and in the LLQ. The pain is described as aching, and is 8/10 in intensity.   Relationship to food: the diarrhea is made worse by foods. Relationship to medications: the patient denies any relationship to medications. Other risk factors: immunocompromise, recent chemotherapy cycle 1 of Cytoxan and Taxotere. Therapy tried so far: none. Work up so far: none. She has no chest pain or shortness of breath. She has no urinary complaints.  REVIEW OF SYSTEMS:   Review of Systems  Constitutional: Positive for malaise/fatigue and weight loss. Negative for chills and fever.  HENT: Negative for congestion and ear pain.   Eyes: Negative.  Negative for blurred  vision and double vision.  Respiratory: Negative.  Negative for cough, sputum production and shortness of breath.   Cardiovascular: Negative.  Negative for chest pain, palpitations and leg swelling.  Gastrointestinal: Positive for abdominal pain, diarrhea, nausea and vomiting. Negative for constipation.  Genitourinary: Negative for dysuria, frequency and urgency.  Musculoskeletal: Negative for back pain and falls.  Skin: Negative.  Negative for itching and rash.  Neurological: Positive for dizziness. Negative for weakness and headaches.  Endo/Heme/Allergies: Negative.  Does not bruise/bleed easily.  Psychiatric/Behavioral: Negative.  Negative for depression. The patient is not nervous/anxious and does not have insomnia.     As per HPI. Otherwise, a complete review of systems is negative.  PAST MEDICAL HISTORY: Past Medical History:  Diagnosis Date  . Anemia    H/O  . Arthritis    RIGHT KNEE  . Breast cancer ([place]) 2017   right breast  . Cancer ([place])    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)  . GERD (gastroesophageal reflux disease)    NO MEDS  . Headache    migraines  . Hypertension   . Last menstrual period (LMP) > 10 days ago 08/2015  . Personal history of radiation therapy     PAST SURGICAL HISTORY: Past Surgical History:  Procedure Laterality Date  . BREAST BIOPSY Right 05/08/2017   invasive mamm. ca  . BREAST EXCISIONAL BIOPSY Right 06/15/15   breast cancer  . BREAST LUMPECTOMY WITH NEEDLE LOCALIZATION Right 06/15/2015   Procedure: BREAST LUMPECTOMY WITH NEEDLE LOCALIZATION;  Surgeon: Hubbard Robinson, MD;  Location: [facility] ORS;  Service: General;  Laterality: Right;  . BREAST SURGERY    . CESAREAN SECTION  1988   x1  . COLONOSCOPY WITH  PROPOFOL N/A 01/23/2017   Procedure: COLONOSCOPY WITH PROPOFOL;  Surgeon: Lin Landsman, MD;  Location: [facility] ENDOSCOPY;  Service: Gastroenterology;  Laterality: N/A;  . ESOPHAGOGASTRODUODENOSCOPY (EGD) WITH PROPOFOL N/A 01/23/2017    Procedure: ESOPHAGOGASTRODUODENOSCOPY (EGD) WITH PROPOFOL;  Surgeon: Lin Landsman, MD;  Location[facility] ENDOSCOPY;  Service: Gastroenterology;  Laterality: N/A;  . MASS EXCISION N/A 05/20/2016   Procedure: EXCISION CHEST WALL MASS;  Surgeon: Florene Glen, MD;  Location: [facility] ORS;  Service: General;  Laterality: N/A;  . SENTINEL NODE BIOPSY Right 06/15/2015   Procedure: SENTINEL NODE BIOPSY;  Surgeon: Hubbard Robinson, MD;  Location: [facility] ORS;  Service: General;  Laterality: Right;  . TOTAL MASTECTOMY Right 05/22/2017   Procedure: TOTAL MASTECTOMY;  Surgeon: Florene Glen, MD;  Location: [facility] ORS;  Service: General;  Laterality: Right;  . TUBAL LIGATION  2004    FAMILY HISTORY Family History  Problem Relation Age of Onset  . Hypertension Mother   . Hypertension Father   . Hypertension Sister   . Breast cancer Neg Hx        ADVANCED DIRECTIVES:    HEALTH MAINTENANCE: Social History   Tobacco Use  . Smoking status: Never Smoker  . Smokeless tobacco: Never Used  Substance Use Topics  . Alcohol use: Yes    Comment: WINE ONCE MONTH  . Drug use: No    Allergies  Allergen Reactions  . Nitroglycerin Other (See Comments)    headache    Current Outpatient Medications  Medication Sig Dispense Refill  . Calcium Carb-Cholecalciferol (CALCIUM-VITAMIN D) 500-200 MG-UNIT tablet Take by mouth.    . Ferrous Sulfate (IRON) 325 (65 Fe) MG TABS Take 1 tablet (325 mg total) by mouth 2 (two) times daily. 60 each 2  . hydrochlorothiazide (HYDRODIURIL) 25 MG tablet Take 25 mg by mouth daily.    Marland Kitchen HYDROcodone-acetaminophen (NORCO/VICODIN) 5-325 MG tablet Take 1-2 tablets by mouth every 4 (four) hours as needed for moderate pain. 30 tablet 0  . Liniments (BLUE-EMU SUPER STRENGTH EX) Apply 1 application topically daily as needed (for pain).    Marland Kitchen omeprazole (PRILOSEC) 20 MG capsule Take 1 capsule (20 mg total) by mouth daily. 30 capsule 3  . ondansetron (ZOFRAN) 8 MG tablet Take 1  tablet (8 mg total) by mouth 2 (two) times daily as needed for refractory nausea / vomiting. 30 tablet 2  . prochlorperazine (COMPAZINE) 10 MG tablet Take 1 tablet (10 mg total) by mouth every 6 (six) hours as needed (Nausea or vomiting). 60 tablet 2  . tetrahydrozoline 0.05 % ophthalmic solution Place 2 drops into both eyes daily.    . potassium chloride SA (K-DUR,KLOR-CON) 20 MEQ tablet Take 1 tablet (20 mEq total) by mouth 2 (two) times daily. 28 tablet 0   No current facility-administered medications for this visit.    Facility-Administered Medications Ordered in Other Visits  Medication Dose Route Frequency Provider Last Rate Last Dose  . sodium chloride 0.9 % 1,000 mL with potassium chloride 20 mEq infusion   Intravenous Continuous Jacquelin Hawking, NP 999 mL/hr at 07/01/17 1247      OBJECTIVE: Vitals:   07/01/17 1132  BP: (!) 143/101  Pulse: (!) 103  Resp: 16  Temp: 97.7 F (36.5 C)  SpO2: 98%     Body mass index is 51.24 kg/m.    ECOG FS:0 - Asymptomatic  Physical Exam  Constitutional: She is oriented to person, place, and time and well-developed, well-nourished, and in no distress. Vital signs  are normal.  HENT:  Head: Normocephalic and atraumatic.  Eyes: Pupils are equal, round, and reactive to light.  Neck: Normal range of motion.  Cardiovascular: Normal rate, regular rhythm and normal heart sounds.  No murmur heard. Pulmonary/Chest: Effort normal and breath sounds normal. She has no wheezes.  Abdominal: Soft. Normal appearance. She exhibits no distension. Bowel sounds are hyperactive. There is no hepatosplenomegaly, splenomegaly or hepatomegaly. There is tenderness in the epigastric area, left upper quadrant and left lower quadrant. There is no rigidity, no rebound, no guarding and no CVA tenderness.  Musculoskeletal: Normal range of motion. She exhibits no edema.  Neurological: She is alert and oriented to person, place, and time. Gait normal.  Skin: Skin is warm and  dry. No rash noted.  Psychiatric: Mood, memory, affect and judgment normal.     LAB RESULTS:  Lab Results  Component Value Date   NA 137 07/01/2017   K 2.9 (L) 07/01/2017   CL 99 (L) 07/01/2017   CO2 26 07/01/2017   GLUCOSE 163 (H) 07/01/2017   BUN 15 07/01/2017   CREATININE 1.16 (H) 07/01/2017   CALCIUM 8.6 (L) 07/01/2017   PROT 7.3 07/01/2017   ALBUMIN 3.4 (L) 07/01/2017   AST 33 07/01/2017   ALT 29 07/01/2017   ALKPHOS 96 07/01/2017   BILITOT 0.3 07/01/2017   GFRNONAA 55 (L) 07/01/2017   GFRAA >60 07/01/2017    Lab Results  Component Value Date   WBC 10.2 07/01/2017   NEUTROABS 7.9 (H) 07/01/2017   HGB 11.8 (L) 07/01/2017   HCT 35.8 07/01/2017   MCV 77.9 (L) 07/01/2017   PLT 257 07/01/2017     STUDIES: No results found.  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 BRCA 1 and 2 negative, Oncotype DX score 17 which is considered low risk.  PLAN:    1. 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  This occurred while patient was taking Aromasin, therefore will likely switch to letrozole after completion of chemotherapy.  Oncotype was not sent on her second breast cancer.  After lengthy discussion with the patient, it was agreed upon that we will proceed with adjuvant chemotherapy using 4 cycles of Taxotere and Cytoxan every 3 weeks. CT scans and bone scans reviewed independently and report as above with no evidence of metastatic disease confirming stage I malignancy.  She is scheduled to return in 2 weeks for cycle 2 of Taxotere and Cytoxan. She presents today to assess tolerance of cycle 1. 2. Epigastric and left quadrant pain: Remote fever of 100.2. No history of  diverticulitis/colitis. Will get stat CT of abdomen with contrast to evaluate.  3. Diarrhea: Will collect stool sample (GI panel and C-diff). Patient can use Imodium OTC for diarrhea PRN.  4. Nausea: Continue antiemetics as prescribed. 5. Hypokalemia: K 2.9. Will give 20 mEq potassium with fluids today. RX 20 MEq of Potassium BID for 14 days will be sent to pharmacy.  6. Dehydration: Labs reveal hypokalemia and elevated kidney function. Patient's vital signs show hypertension and tachycardia. Will give 1 liter Normal Saline. Encouraged her to stay hydrated and to take Imodium to prevent electrolyte abnormalities. 7. Tachycardia: Will rule out cardiac involvement. Suspect dehydration is reason for tachycardia. EKG revealed  normal sinus rhythm. Replete potassium as above.   Greater than 50% was spent in counseling and coordination of care with this patient including but not limited to discussion of the relevant topics above (See A&P) including, but not limited to diagnosis and management of acute and chronic medical conditions.   Patient expressed understanding and was in agreement with this plan. She also understands that She can call clinic at any time with any questions, concerns, or complaints.   Jacquelin Hawking, NP 07/01/17 2:43 PM

## 2017-07-02 ENCOUNTER — Encounter: Payer: Self-pay | Admitting: Oncology

## 2017-07-02 ENCOUNTER — Inpatient Hospital Stay: Payer: Medicaid Other

## 2017-07-02 ENCOUNTER — Other Ambulatory Visit: Payer: Self-pay

## 2017-07-02 ENCOUNTER — Inpatient Hospital Stay (HOSPITAL_BASED_OUTPATIENT_CLINIC_OR_DEPARTMENT_OTHER): Payer: Medicaid Other | Admitting: Oncology

## 2017-07-02 ENCOUNTER — Other Ambulatory Visit: Payer: Self-pay | Admitting: Oncology

## 2017-07-02 VITALS — BP 124/83 | HR 98 | Temp 98.2°F | Resp 18

## 2017-07-02 DIAGNOSIS — R197 Diarrhea, unspecified: Secondary | ICD-10-CM

## 2017-07-02 DIAGNOSIS — R509 Fever, unspecified: Secondary | ICD-10-CM

## 2017-07-02 DIAGNOSIS — E860 Dehydration: Secondary | ICD-10-CM

## 2017-07-02 DIAGNOSIS — R634 Abnormal weight loss: Secondary | ICD-10-CM | POA: Diagnosis not present

## 2017-07-02 DIAGNOSIS — C50411 Malignant neoplasm of upper-outer quadrant of right female breast: Secondary | ICD-10-CM | POA: Diagnosis not present

## 2017-07-02 DIAGNOSIS — R110 Nausea: Secondary | ICD-10-CM

## 2017-07-02 DIAGNOSIS — R109 Unspecified abdominal pain: Secondary | ICD-10-CM | POA: Diagnosis not present

## 2017-07-02 DIAGNOSIS — Z5111 Encounter for antineoplastic chemotherapy: Secondary | ICD-10-CM | POA: Diagnosis not present

## 2017-07-02 LAB — GASTROINTESTINAL PANEL BY PCR, STOOL (REPLACES STOOL CULTURE)

## 2017-07-02 LAB — C DIFFICILE QUICK SCREEN W PCR REFLEX
C DIFFICILE (CDIFF) INTERP: NOT DETECTED
C DIFFICILE (CDIFF) TOXIN: NEGATIVE
C DIFFICLE (CDIFF) ANTIGEN: NEGATIVE

## 2017-07-02 MED ORDER — DIPHENOXYLATE-ATROPINE 2.5-0.025 MG PO TABS
1.0000 | ORAL_TABLET | Freq: Once | ORAL | Status: AC
  Administered 2017-07-02: 1 via ORAL
  Filled 2017-07-02: qty 1

## 2017-07-02 MED ORDER — SODIUM CHLORIDE 0.9 % IV SOLN
Freq: Once | INTRAVENOUS | Status: AC
  Administered 2017-07-02: 12:00:00 via INTRAVENOUS
  Filled 2017-07-02: qty 1000

## 2017-07-02 MED ORDER — DIPHENOXYLATE-ATROPINE 2.5-0.025 MG PO TABS: 1.0000 | ORAL_TABLET | Freq: Four times a day (QID) | ORAL | 0 refills | Status: DC | PRN

## 2017-07-02 MED ORDER — SODIUM CHLORIDE 0.9 % IV SOLN: Freq: Once | INTRAVENOUS | Status: DC

## 2017-07-02 MED ORDER — ONDANSETRON HCL 4 MG/2ML IJ SOLN
8.0000 mg | Freq: Once | INTRAMUSCULAR | Status: AC
  Administered 2017-07-02: 8 mg via INTRAVENOUS
  Filled 2017-07-02: qty 4

## 2017-07-02 MED ORDER — CIPROFLOXACIN HCL 500 MG PO TABS: 500.0000 mg | ORAL_TABLET | Freq: Two times a day (BID) | ORAL | 0 refills | Status: DC

## 2017-07-02 NOTE — Progress Notes (Signed)
  Oncology Nurse Navigator Documentation  Navigator Location: [facility] (07/02/17 1600)   )Navigator Encounter Type: Clinic/MDC (07/02/17 1600) Telephone: Symptom Mgt (07/02/17 1600)                           Interventions: Psycho-social support;Referrals (07/02/17 1600) Referrals: Social Work([facility]) (07/02/17 1600)                    Time Spent with Patient: 30 (07/02/17 1600)   Met patient in symptom management clinic.  She is to make appointment to talk to [facility].  Her husband is to have back surgery in near future.  Consent signed to participate in [facility] services.

## 2017-07-02 NOTE — Progress Notes (Signed)
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 Patient recently had first cycle of Taxotere and Cytoxan.   Patient's primary medical oncologist is Dr. Grayland Ormond. She was last seen in clinic on 06/24/2017 prior to cycle 1 of Taxotere and Cytoxan. She complained of anxiety but other than that felt fine. Patient diagnosed with a second primary on 05/22/2017 of adenocarcinoma of the upper outer quadrant of right breast while taking Aromasin. She was switched to letrozole. She is scheduled to receive 4 cycles of Taxotere and Cytoxan every 3 weeks. Most recent CT scans and bone scans revealed no evidence of metastatic disease.  Interval history-  Patient complains of continued diarrhea and nausea.  Onset was a week ago.  Occurring approximately 4 times per day.  Patient describes diarrhea as watery.  Diarrhea has been associated with abdominal pain described as aching and stabbing, fever to 100.2 and weight loss of 6 lbs since last week.   Patient denies recent antibiotic use, recent travel.   Previous visits for diarrhea: none.  Evaluation to date: CBC and stool cultures.  Treatment to date: CT scan revealing colitis.   ECOG FS:1 - Symptomatic but completely ambulatory  Review of systems- Review of Systems  Constitutional: Negative.  Negative for chills, fever, malaise/fatigue and weight loss.   HENT: Negative for congestion and ear pain.   Eyes: Negative.  Negative for blurred vision and double vision.  Respiratory: Negative.  Negative for cough, sputum production and shortness of breath.   Cardiovascular: Negative.  Negative for chest pain, palpitations and leg swelling.  Gastrointestinal: Positive for abdominal pain, diarrhea and nausea. Negative for constipation and vomiting.       GI symptoms are improving with OTC Imodium since yesterday. She also is taking Zofran and Compazine for nausea with relief.  Genitourinary: Negative for dysuria, frequency and urgency.  Musculoskeletal: Negative for back pain and falls.  Skin: Negative.  Negative for rash.  Neurological: Negative.  Negative for weakness and headaches.  Endo/Heme/Allergies: Negative.  Does not bruise/bleed easily.  Psychiatric/Behavioral: Negative.  Negative for depression. The patient is not nervous/anxious and does not have insomnia.      Current treatment- S/p 1 cycle Cytoxan and Taxotere  Allergies  Allergen Reactions  . Nitroglycerin Other (See Comments)    headache     Past Medical History:  Diagnosis Date  . Anemia    H/O  . Arthritis    RIGHT KNEE  . Breast cancer ([place]) 2017   right breast  . Cancer ([place])    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)  . GERD (gastroesophageal reflux disease)    NO MEDS  . Headache    migraines  . Hypertension   . Last menstrual period (LMP) > 10 days ago 08/2015  . Personal history of radiation therapy      Past Surgical History:  Procedure Laterality Date  . BREAST BIOPSY Right 05/08/2017   invasive mamm.  ca  . BREAST EXCISIONAL BIOPSY Right 06/15/15   breast cancer  . BREAST LUMPECTOMY WITH NEEDLE LOCALIZATION Right 06/15/2015   Procedure: BREAST LUMPECTOMY WITH NEEDLE LOCALIZATION;  Surgeon: Hubbard Robinson, MD;  Location: [facility] ORS;  Service: General;  Laterality: Right;  . BREAST SURGERY    . CESAREAN SECTION  1988   x1  . COLONOSCOPY WITH PROPOFOL N/A  01/23/2017   Procedure: COLONOSCOPY WITH PROPOFOL;  Surgeon: Lin Landsman, MD;  Location: [facility] ENDOSCOPY;  Service: Gastroenterology;  Laterality: N/A;  . ESOPHAGOGASTRODUODENOSCOPY (EGD) WITH PROPOFOL N/A 01/23/2017   Procedure: ESOPHAGOGASTRODUODENOSCOPY (EGD) WITH PROPOFOL;  Surgeon: Lin Landsman, MD;  Location: [facility] ENDOSCOPY;  Service: Gastroenterology;  Laterality: N/A;  . MASS EXCISION N/A 05/20/2016   Procedure: EXCISION CHEST WALL MASS;  Surgeon: Florene Glen, MD;  Location: [facility] ORS;  Service: General;  Laterality: N/A;  . SENTINEL NODE BIOPSY Right 06/15/2015   Procedure: SENTINEL NODE BIOPSY;  Surgeon: Hubbard Robinson, MD;  Location: [facility] ORS;  Service: General;  Laterality: Right;  . TOTAL MASTECTOMY Right 05/22/2017   Procedure: TOTAL MASTECTOMY;  Surgeon: Florene Glen, MD;  Location: [facility] ORS;  Service: General;  Laterality: Right;  . TUBAL LIGATION  2004    Social History   Socioeconomic History  . Marital status: Married    Spouse name: Not on file  . Number of children: Not on file  . Years of education: Not on file  . Highest education level: Not on file  Occupational History  . Not on file  Social Needs  . Financial resource strain: Not on file  . Food insecurity:    Worry: Not on file    Inability: Not on file  . Transportation needs:    Medical: Not on file    Non-medical: Not on file  Tobacco Use  . Smoking status: Never Smoker  . Smokeless tobacco: Never Used  Substance and Sexual Activity  . Alcohol use: Yes    Comment: WINE ONCE MONTH  . Drug use: No  . Sexual activity: Not on file  Lifestyle  . Physical activity:    Days per week: Not on file    Minutes per session: Not on file  . Stress: Not on file  Relationships  . Social connections:    Talks on phone: Not on file    Gets together: Not on file    Attends religious service: Not on file    Active member of club or organization: Not on file    Attends meetings of  clubs or organizations: Not on file    Relationship status: Not on file  . Intimate partner violence:    Fear of current or ex partner: Not on file    Emotionally abused: Not on file    Physically abused: Not on file    Forced sexual activity: Not on file  Other Topics Concern  . Not on file  Social History Narrative  . Not on file    Family History  Problem Relation Age of Onset  . Hypertension Mother   . Hypertension Father   . Hypertension Sister   . Breast cancer Neg Hx      Current Outpatient Medications:  .  Calcium Carb-Cholecalciferol (CALCIUM-VITAMIN D) 500-200 MG-UNIT tablet, Take by mouth., Disp: , Rfl:  .  ciprofloxacin (CIPRO) 500 MG tablet, Take 1 tablet (500 mg total) by mouth 2 (two) times daily., Disp: 14 tablet, Rfl: 0 .  diphenoxylate-atropine (LOMOTIL) 2.5-0.025 MG tablet,  Take 1 tablet by mouth 4 (four) times daily as needed for diarrhea or loose stools., Disp: 30 tablet, Rfl: 0 .  Ferrous Sulfate (IRON) 325 (65 Fe) MG TABS, Take 1 tablet (325 mg total) by mouth 2 (two) times daily., Disp: 60 each, Rfl: 2 .  hydrochlorothiazide (HYDRODIURIL) 25 MG tablet, Take 25 mg by mouth daily., Disp: , Rfl:  .  HYDROcodone-acetaminophen (NORCO/VICODIN) 5-325 MG tablet, Take 1-2 tablets by mouth every 4 (four) hours as needed for moderate pain., Disp: 30 tablet, Rfl: 0 .  Liniments (BLUE-EMU SUPER STRENGTH EX), Apply 1 application topically daily as needed (for pain)., Disp: , Rfl:  .  omeprazole (PRILOSEC) 20 MG capsule, Take 1 capsule (20 mg total) by mouth daily., Disp: 30 capsule, Rfl: 3 .  ondansetron (ZOFRAN) 8 MG tablet, Take 1 tablet (8 mg total) by mouth 2 (two) times daily as needed for refractory nausea / vomiting., Disp: 30 tablet, Rfl: 2 .  potassium chloride SA (K-DUR,KLOR-CON) 20 MEQ tablet, Take 1 tablet (20 mEq total) by mouth 2 (two) times daily., Disp: 28 tablet, Rfl: 0 .  prochlorperazine (COMPAZINE) 10 MG tablet, Take 1 tablet (10 mg total) by mouth every  6 (six) hours as needed (Nausea or vomiting)., Disp: 60 tablet, Rfl: 2 .  tetrahydrozoline 0.05 % ophthalmic solution, Place 2 drops into both eyes daily., Disp: , Rfl:   Physical exam:  Vitals:   07/02/17 1146  BP: 124/83  Pulse: 98  Resp: 18  Temp: 98.2 F (36.8 C)  TempSrc: Tympanic   Physical Exam  Constitutional: She is oriented to person, place, and time and well-developed, well-nourished, and in no distress. Vital signs are normal.  HENT:  Head: Normocephalic and atraumatic.  Eyes: Pupils are equal, round, and reactive to light.  Neck: Normal range of motion.  Cardiovascular: Normal rate, regular rhythm and normal heart sounds.  No murmur heard. Pulmonary/Chest: Effort normal and breath sounds normal. She has no wheezes.  Abdominal: Soft. Normal appearance and bowel sounds are normal. She exhibits no distension. There is no tenderness.  Musculoskeletal: Normal range of motion. She exhibits no edema.  Neurological: She is alert and oriented to person, place, and time. Gait normal.  Skin: Skin is warm and dry. No rash noted.  Psychiatric: Mood, memory, affect and judgment normal.     CMP Latest Ref Rng & Units 07/01/2017  Glucose 65 - 99 mg/dL 163(H)  BUN 6 - 20 mg/dL 15  Creatinine 0.44 - 1.00 mg/dL 1.16(H)  Sodium 135 - 145 mmol/L 137  Potassium 3.5 - 5.1 mmol/L 2.9(L)  Chloride 101 - 111 mmol/L 99(L)  CO2 22 - 32 mmol/L 26  Calcium 8.9 - 10.3 mg/dL 8.6(L)  Total Protein 6.5 - 8.1 g/dL 7.3  Total Bilirubin 0.3 - 1.2 mg/dL 0.3  Alkaline Phos 38 - 126 U/L 96  AST 15 - 41 U/L 33  ALT 14 - 54 U/L 29   CBC Latest Ref Rng & Units 07/01/2017  WBC 3.6 - 11.0 K/uL 10.2  Hemoglobin 12.0 - 16.0 g/dL 11.8(L)  Hematocrit 35.0 - 47.0 % 35.8  Platelets 150 - 440 K/uL 257    No images are attached to the encounter.  Ct Abdomen Pelvis W Contrast  Result Date: 07/01/2017 CLINICAL DATA:  Patient with severe diarrhea.  Abdominal pain. EXAM: CT ABDOMEN AND PELVIS WITH CONTRAST  TECHNIQUE: Multidetector CT imaging of the abdomen and pelvis was performed using the standard protocol following bolus administration of intravenous contrast. CONTRAST:  182mL ISOVUE-300 IOPAMIDOL (ISOVUE-300) INJECTION 61% COMPARISON:  CT abdomen pelvis 05/16/2017. FINDINGS: Lower chest: Normal heart size. Lung bases are clear. No pleural effusion. Hepatobiliary: Liver is normal in size and contour. Gallbladder is unremarkable. Grossly unchanged 1.6 cm low-attenuation lesion right hepatic lobe (image 30; series 2). Additional subcentimeter too small to characterize low-attenuation lesions are similar (image 30; series 2) (image 17; series 2). Pancreas: Unremarkable Spleen: Unremarkable Adrenals/Urinary Tract: Adrenal glands are normal. Kidneys enhance symmetrically with contrast. No hydronephrosis. Urinary bladder is decompressed. Stomach/Bowel: Colon is decompressed limiting evaluation however there is suggestion of diffuse colonic wall thickening. No evidence for bowel obstruction. Small hiatal hernia. Normal morphology of the stomach. No free fluid or free intraperitoneal air. Vascular/Lymphatic: Normal caliber abdominal aorta. Peripheral calcified atherosclerotic plaque. No retroperitoneal lymphadenopathy. Reproductive: Uterus and adnexal structures are unremarkable. Right ovarian cyst. Other: None. Musculoskeletal: Lumbar spine degenerative changes. No aggressive or acute appearing osseous lesions. Bilateral L5 pars defects. Incompletely visualized postmastectomy/surgical changes inferior anterior right chest wall. IMPRESSION: 1. Mild wall thickening of the colon as can be seen with colitis. 2. Re demonstrated 1.6 cm low-attenuation lesion within the right hepatic lobe, indeterminate on current exam. Recommend further evaluation with pre and post contrast-enhanced abdominal MRI. Electronically Signed   By: Lovey Newcomer M.D.   On: 07/01/2017 17:09     Assessment and plan- Patient is a 48 y.o. female who  presents to symptom management to provide a stool sample. While in clinic, patient stated she felt nauseated and would like to be evaluated. Vitals signs stable. No labs drawn today.   1. Nausea: Patient will receive 1 L normal saline with 20 mEq of Potassium and 8 mg Zofran. After receiving results from CT abdomen yesterday, Cipro 500 mg twice a day for 7 days , Lomotil PRN and 20 mEq BID potassium prescription was sent to [facility]. They have not picked these up.   She is monitored throughout her infusion today. She admitted to feeling "much better". She has not started her antibiotic yet but will do so after she leaves clinic today. Patient knows to call us if she develops a fever or worsening abdominal pain. Patient instructed to take Lomotil around-the-clock as well as anti-emetics. Patient is in agreement.  C-Diff and GI panel was negative.   Visit Diagnosis 1. Nausea without vomiting   2. Diarrhea, unspecified type     Patient expressed understanding and was in agreement with this plan. She also understands that She can call clinic at any time with any questions, concerns, or complaints.   Greater than 50% was spent in counseling and coordination of care with this patient including but not limited to discussion of the relevant topics above (See A&P) including, but not limited to diagnosis and management of acute and chronic medical conditions.    Faythe Casa, AGNP-C [facility]- [phone] Pager- [phone] 07/03/2017 4:13 PM

## 2017-07-02 NOTE — Progress Notes (Signed)
Patient prescribed Cipro 500 mg BID for 7 days for colitis. She has no allergies to antibiotics. Patient still having severe diarrhea with cramping (5-6 stools per day). No fevers and no blood present. She will provide a stool sample for [place] and bring it to clinic this morning. Will also give a prescription for Lomotil to help with diarrhea. Both prescriptions were sent to her pharmacy is morning.  Incidental findings from CT scan show a 1.6 cm low attenuation lesion within the right hepatic lobe. It was recommended by radiologist for further evaluation with pre-and post contrast enhanced abdominal MRI. Dr. Grayland Ormond will return tomorrow and we will discuss this in detail. We will order MRI if indicated by primary medical oncologist Dr. Grayland Ormond.  Faythe Casa, NP 07/02/2017 9:53 AM

## 2017-07-13 NOTE — Progress Notes (Signed)
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   INTERVAL HISTORY: Patient returns to clinic today for further evaluation and consideration of cycle 2 of 4 of Taxotere and Cytoxan.  She had a difficult time after cycle 1 with significant diarrhea, abdominal pain, nausea, and vomiting.  This is now resolved other than some mild diarrhea and she feels nearly back to her baseline.  She does admit to increased right arm pain and swelling.  She is anxious about treatment today, but otherwise feels well. She has no neurologic complaints. She denies any recent fevers or illnesses. She has a good appetite and denies weight loss. She has no chest pain or shortness of breath. She has no urinary complaints. Patient offers no specific complaints today.  REVIEW OF SYSTEMS:   Review of Systems  Constitutional: Negative.  Negative for fever, malaise/fatigue and weight loss.  Respiratory: Negative.  Negative for cough and shortness of breath.   Cardiovascular: Negative.  Negative for chest pain and leg swelling.  Gastrointestinal: Positive for diarrhea. Negative for abdominal pain, constipation, nausea and vomiting.  Genitourinary: Negative.  Negative for dysuria.  Musculoskeletal: Negative.  Negative for myalgias.  Skin: Negative.  Negative for rash.  Neurological: Negative.  Negative for sensory change, focal weakness and weakness.  Psychiatric/Behavioral: The patient is nervous/anxious.     As per HPI. Otherwise, a complete review of systems is negative.  PAST MEDICAL HISTORY: Past  Medical History:  Diagnosis Date  . Anemia    H/O  . Arthritis    RIGHT KNEE  . Breast cancer ([place]) 2017   right breast  . Cancer ([place])    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)  . GERD (gastroesophageal reflux disease)    NO MEDS  . Headache    migraines  . Hypertension   . Last menstrual period (LMP) > 10 days ago 08/2015  . Personal history of radiation therapy     PAST SURGICAL HISTORY: Past Surgical History:  Procedure Laterality Date  . BREAST BIOPSY Right 05/08/2017   invasive mamm. ca  . BREAST EXCISIONAL BIOPSY Right 06/15/15   breast cancer  . BREAST LUMPECTOMY WITH NEEDLE LOCALIZATION Right 06/15/2015   Procedure: BREAST LUMPECTOMY WITH NEEDLE LOCALIZATION;  Surgeon: Hubbard Robinson, MD;  Location: [facility] ORS;  Service: General;  Laterality: Right;  . BREAST SURGERY    . CESAREAN SECTION  1988   x1  . COLONOSCOPY WITH PROPOFOL N/A 01/23/2017   Procedure: COLONOSCOPY WITH PROPOFOL;  Surgeon: Lin Landsman, MD;  Location: [facility] ENDOSCOPY;  Service: Gastroenterology;  Laterality: N/A;  . ESOPHAGOGASTRODUODENOSCOPY (EGD) WITH PROPOFOL N/A 01/23/2017   Procedure: ESOPHAGOGASTRODUODENOSCOPY (EGD) WITH PROPOFOL;  Surgeon: Lin Landsman, MD;  Location: [facility] ENDOSCOPY;  Service: Gastroenterology;  Laterality: N/A;  . MASS EXCISION N/A 05/20/2016   Procedure: EXCISION CHEST WALL MASS;  Surgeon: Florene Glen, MD;  Location: [facility] ORS;  Service: General;  Laterality: N/A;  . SENTINEL NODE BIOPSY Right 06/15/2015   Procedure: SENTINEL NODE BIOPSY;  Surgeon: Hubbard Robinson, MD;  Location: [facility] ORS;  Service: General;  Laterality: Right;  . TOTAL MASTECTOMY Right 05/22/2017  Procedure: TOTAL MASTECTOMY;  Surgeon: Florene Glen, MD;  Location: [facility] ORS;  Service: General;  Laterality: Right;  . TUBAL LIGATION  2004    FAMILY HISTORY Family History  Problem Relation Age of Onset  . Hypertension Mother   . Hypertension Father   . Hypertension Sister   . Breast cancer  Neg Hx        ADVANCED DIRECTIVES:    HEALTH MAINTENANCE: Social History   Tobacco Use  . Smoking status: Never Smoker  . Smokeless tobacco: Never Used  Substance Use Topics  . Alcohol use: Yes    Comment: WINE ONCE MONTH  . Drug use: No    Allergies  Allergen Reactions  . Nitroglycerin Other (See Comments)    headache    Current Outpatient Medications  Medication Sig Dispense Refill  . Calcium Carb-Cholecalciferol (CALCIUM-VITAMIN D) 500-200 MG-UNIT tablet Take by mouth.    . ciprofloxacin (CIPRO) 500 MG tablet Take 1 tablet (500 mg total) by mouth 2 (two) times daily. 14 tablet 0  . diphenoxylate-atropine (LOMOTIL) 2.5-0.025 MG tablet Take 1 tablet by mouth 4 (four) times daily as needed for diarrhea or loose stools. 30 tablet 0  . Ferrous Sulfate (IRON) 325 (65 Fe) MG TABS Take 1 tablet (325 mg total) by mouth 2 (two) times daily. 60 each 2  . hydrochlorothiazide (HYDRODIURIL) 25 MG tablet Take 25 mg by mouth daily.    Marland Kitchen HYDROcodone-acetaminophen (NORCO/VICODIN) 5-325 MG tablet Take 1-2 tablets by mouth every 4 (four) hours as needed for moderate pain. 30 tablet 0  . Liniments (BLUE-EMU SUPER STRENGTH EX) Apply 1 application topically daily as needed (for pain).    Marland Kitchen omeprazole (PRILOSEC) 20 MG capsule Take 1 capsule (20 mg total) by mouth daily. 30 capsule 3  . ondansetron (ZOFRAN) 8 MG tablet Take 1 tablet (8 mg total) by mouth 2 (two) times daily as needed for refractory nausea / vomiting. 30 tablet 2  . potassium chloride SA (K-DUR,KLOR-CON) 20 MEQ tablet Take 1 tablet (20 mEq total) by mouth 2 (two) times daily. 30 tablet 1  . prochlorperazine (COMPAZINE) 10 MG tablet Take 1 tablet (10 mg total) by mouth every 6 (six) hours as needed (Nausea or vomiting). 60 tablet 2  . tetrahydrozoline 0.05 % ophthalmic solution Place 2 drops into both eyes daily.     No current facility-administered medications for this visit.    Facility-Administered Medications Ordered in Other  Visits  Medication Dose Route Frequency Provider Last Rate Last Dose  . cyclophosphamide (CYTOXAN) 1,660 mg in sodium chloride 0.9 % 250 mL chemo infusion  600 mg/m2 (Treatment Plan Recorded) Intravenous Once Lloyd Huger, MD      . DOCEtaxel (TAXOTERE) 210 mg in sodium chloride 0.9 % 500 mL chemo infusion  75 mg/m2 (Treatment Plan Recorded) Intravenous Once Lloyd Huger, MD      . pegfilgrastim (NEULASTA ONPRO KIT) injection 6 mg  6 mg Subcutaneous Once Lloyd Huger, MD        OBJECTIVE: Vitals:   07/15/17 0928 07/15/17 0934  BP:  127/88  Pulse:  83  Resp: 12   Temp:  97.8 F (36.6 C)     Body mass index is 50.37 kg/m.    ECOG FS:0 - Asymptomatic  General: Well-developed, well-nourished, no acute distress. Eyes: Pink conjunctiva, anicteric sclera. Breast: Right mastectomy. Lungs: Clear to auscultation bilaterally. Heart: Regular rate and rhythm. No rubs, murmurs, or gallops. Abdomen: Soft, nontender, nondistended. No organomegaly noted, normoactive bowel sounds.  Musculoskeletal: No edema, cyanosis, or clubbing. Neuro: Alert, answering all questions appropriately. Cranial nerves grossly intact. Skin: No rashes or petechiae noted. Psych: Normal affect.   LAB RESULTS:  Lab Results  Component Value Date   NA 137 07/15/2017   K 3.2 (L) 07/15/2017   CL 100 (L) 07/15/2017   CO2 28 07/15/2017   GLUCOSE 139 (H) 07/15/2017   BUN 12 07/15/2017   CREATININE 1.08 (H) 07/15/2017   CALCIUM 8.7 (L) 07/15/2017   PROT 6.9 07/15/2017   ALBUMIN 3.2 (L) 07/15/2017   AST 35 07/15/2017   ALT 40 07/15/2017   ALKPHOS 92 07/15/2017   BILITOT 0.7 07/15/2017   GFRNONAA >60 07/15/2017   GFRAA >60 07/15/2017    Lab Results  Component Value Date   WBC 6.1 07/15/2017   NEUTROABS 4.8 07/15/2017   HGB 11.1 (L) 07/15/2017   HCT 33.2 (L) 07/15/2017   MCV 78.5 (L) 07/15/2017   PLT 278 07/15/2017     STUDIES: Ct Abdomen Pelvis W Contrast  Result Date:  07/01/2017 CLINICAL DATA:  Patient with severe diarrhea.  Abdominal pain. EXAM: CT ABDOMEN AND PELVIS WITH CONTRAST TECHNIQUE: Multidetector CT imaging of the abdomen and pelvis was performed using the standard protocol following bolus administration of intravenous contrast. CONTRAST:  168m ISOVUE-300 IOPAMIDOL (ISOVUE-300) INJECTION 61% COMPARISON:  CT abdomen pelvis 05/16/2017. FINDINGS: Lower chest: Normal heart size. Lung bases are clear. No pleural effusion. Hepatobiliary: Liver is normal in size and contour. Gallbladder is unremarkable. Grossly unchanged 1.6 cm low-attenuation lesion right hepatic lobe (image 30; series 2). Additional subcentimeter too small to characterize low-attenuation lesions are similar (image 30; series 2) (image 17; series 2). Pancreas: Unremarkable Spleen: Unremarkable Adrenals/Urinary Tract: Adrenal glands are normal. Kidneys enhance symmetrically with contrast. No hydronephrosis. Urinary bladder is decompressed. Stomach/Bowel: Colon is decompressed limiting evaluation however there is suggestion of diffuse colonic wall thickening. No evidence for bowel obstruction. Small hiatal hernia. Normal morphology of the stomach. No free fluid or free intraperitoneal air. Vascular/Lymphatic: Normal caliber abdominal aorta. Peripheral calcified atherosclerotic plaque. No retroperitoneal lymphadenopathy. Reproductive: Uterus and adnexal structures are unremarkable. Right ovarian cyst. Other: None. Musculoskeletal: Lumbar spine degenerative changes. No aggressive or acute appearing osseous lesions. Bilateral L5 pars defects. Incompletely visualized postmastectomy/surgical changes inferior anterior right chest wall. IMPRESSION: 1. Mild wall thickening of the colon as can be seen with colitis. 2. Re demonstrated 1.6 cm low-attenuation lesion within the right hepatic lobe, indeterminate on current exam. Recommend further evaluation with pre and post contrast-enhanced abdominal MRI. Electronically  Signed   By: DLovey NewcomerM.D.   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 BRCA 1 and 2 negative, Oncotype DX score 17 which is considered low risk.  PLAN:    1. 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  This occurred while patient was taking Aromasin, therefore will likely switch to letrozole after completion of chemotherapy.  Oncotype was not sent on her second breast cancer.  Plan to give adjuvant chemotherapy using 4 cycles of Taxotere and Cytoxan every 3 weeks.  CT scans and bone scans reviewed independently with no evidence of metastatic disease confirming stage I malignancy.  Proceed with cycle 2 for Taxotere and Cytoxan  today.  Because of patient's difficulty with nausea and diarrhea after cycle 1, she will return to clinic on Friday for IV fluids only and then in 3 weeks for further evaluation and consideration of cycle 3.     2. Genetic testing: Patient was found to be BRCA 1 and 2 negative. She expressed understanding that although her genetic testing was negative, her children are at higher risk for breast cancer than the general population.   3. Bone mineral density: Bone density from September 04, 2016 reported Z score of 1.0 which is considered normal. Repeat in June 2020. 4.  Hypokalemia: Mild, monitor.  Continue oral potassium supplementation. 5.  Diarrhea: Continue Imodium as needed. 6.  Right arm pain/swelling: Patient was given a referral to lymphedema clinic.    Patient expressed understanding and was in agreement with this plan. She also understands that She can call clinic at any time with any questions, concerns, or complaints.    Lloyd Huger, MD  07/15/17 10:38 AM

## 2017-07-15 ENCOUNTER — Inpatient Hospital Stay (HOSPITAL_BASED_OUTPATIENT_CLINIC_OR_DEPARTMENT_OTHER): Payer: Medicaid Other | Admitting: Oncology

## 2017-07-15 ENCOUNTER — Encounter: Payer: Self-pay | Admitting: Oncology

## 2017-07-15 ENCOUNTER — Inpatient Hospital Stay: Payer: Medicaid Other

## 2017-07-15 ENCOUNTER — Other Ambulatory Visit: Payer: Self-pay

## 2017-07-15 ENCOUNTER — Encounter: Payer: Medicaid Other | Admitting: Surgery

## 2017-07-15 VITALS — BP 125/87 | HR 83 | Temp 98.3°F | Resp 18

## 2017-07-15 VITALS — BP 127/88 | HR 83 | Temp 97.8°F | Resp 12 | Ht 69.0 in | Wt 341.1 lb

## 2017-07-15 DIAGNOSIS — Z9013 Acquired absence of bilateral breasts and nipples: Secondary | ICD-10-CM | POA: Diagnosis not present

## 2017-07-15 DIAGNOSIS — Z170 Estrogen receptor positive status [ER+]: Secondary | ICD-10-CM

## 2017-07-15 DIAGNOSIS — R450 Nervousness: Secondary | ICD-10-CM | POA: Diagnosis not present

## 2017-07-15 DIAGNOSIS — I890 Lymphedema, not elsewhere classified: Secondary | ICD-10-CM

## 2017-07-15 DIAGNOSIS — M7989 Other specified soft tissue disorders: Secondary | ICD-10-CM

## 2017-07-15 DIAGNOSIS — M79601 Pain in right arm: Secondary | ICD-10-CM | POA: Diagnosis not present

## 2017-07-15 DIAGNOSIS — C50411 Malignant neoplasm of upper-outer quadrant of right female breast: Secondary | ICD-10-CM | POA: Diagnosis not present

## 2017-07-15 DIAGNOSIS — Z5111 Encounter for antineoplastic chemotherapy: Secondary | ICD-10-CM | POA: Diagnosis not present

## 2017-07-15 DIAGNOSIS — R197 Diarrhea, unspecified: Secondary | ICD-10-CM

## 2017-07-15 DIAGNOSIS — E876 Hypokalemia: Secondary | ICD-10-CM

## 2017-07-15 LAB — COMPREHENSIVE METABOLIC PANEL
ALK PHOS: 92 U/L (ref 38–126)
ALT: 40 U/L (ref 14–54)
AST: 35 U/L (ref 15–41)
Albumin: 3.2 g/dL — ABNORMAL LOW (ref 3.5–5.0)
Anion gap: 9 (ref 5–15)
BUN: 12 mg/dL (ref 6–20)
CALCIUM: 8.7 mg/dL — AB (ref 8.9–10.3)
CHLORIDE: 100 mmol/L — AB (ref 101–111)
CO2: 28 mmol/L (ref 22–32)
CREATININE: 1.08 mg/dL — AB (ref 0.44–1.00)
Glucose, Bld: 139 mg/dL — ABNORMAL HIGH (ref 65–99)
Potassium: 3.2 mmol/L — ABNORMAL LOW (ref 3.5–5.1)
Sodium: 137 mmol/L (ref 135–145)
Total Bilirubin: 0.7 mg/dL (ref 0.3–1.2)
Total Protein: 6.9 g/dL (ref 6.5–8.1)

## 2017-07-15 LAB — CBC WITH DIFFERENTIAL/PLATELET
BASOS ABS: 0.1 10*3/uL (ref 0–0.1)
Basophils Relative: 1 %
Eosinophils Absolute: 0 10*3/uL (ref 0–0.7)
Eosinophils Relative: 1 %
HEMATOCRIT: 33.2 % — AB (ref 35.0–47.0)
HEMOGLOBIN: 11.1 g/dL — AB (ref 12.0–16.0)
LYMPHS PCT: 13 %
Lymphs Abs: 0.8 10*3/uL — ABNORMAL LOW (ref 1.0–3.6)
MCH: 26.2 pg (ref 26.0–34.0)
MCHC: 33.4 g/dL (ref 32.0–36.0)
MCV: 78.5 fL — AB (ref 80.0–100.0)
Monocytes Absolute: 0.5 10*3/uL (ref 0.2–0.9)
Monocytes Relative: 8 %
NEUTROS ABS: 4.8 10*3/uL (ref 1.4–6.5)
NEUTROS PCT: 77 %
PLATELETS: 278 10*3/uL (ref 150–440)
RBC: 4.23 MIL/uL (ref 3.80–5.20)
RDW: 19.1 % — ABNORMAL HIGH (ref 11.5–14.5)
WBC: 6.1 10*3/uL (ref 3.6–11.0)

## 2017-07-15 MED ORDER — SODIUM CHLORIDE 0.9 % IV SOLN
75.0000 mg/m2 | Freq: Once | INTRAVENOUS | Status: AC
  Administered 2017-07-15: 210 mg via INTRAVENOUS
  Filled 2017-07-15: qty 21

## 2017-07-15 MED ORDER — POTASSIUM CHLORIDE CRYS ER 20 MEQ PO TBCR: 20.0000 meq | EXTENDED_RELEASE_TABLET | Freq: Two times a day (BID) | ORAL | 1 refills | Status: DC

## 2017-07-15 MED ORDER — DIPHENHYDRAMINE HCL 50 MG/ML IJ SOLN
25.0000 mg | Freq: Once | INTRAMUSCULAR | Status: AC | PRN
  Administered 2017-07-15: 25 mg via INTRAVENOUS

## 2017-07-15 MED ORDER — METHYLPREDNISOLONE SODIUM SUCC 125 MG IJ SOLR
125.0000 mg | Freq: Once | INTRAMUSCULAR | Status: AC | PRN
  Administered 2017-07-15: 125 mg via INTRAVENOUS

## 2017-07-15 MED ORDER — SODIUM CHLORIDE 0.9 % IV SOLN
Freq: Once | INTRAVENOUS | Status: AC | PRN
  Administered 2017-07-15: 11:00:00 via INTRAVENOUS
  Filled 2017-07-15: qty 1000

## 2017-07-15 MED ORDER — SODIUM CHLORIDE 0.9 % IV SOLN: 10.0000 mg | Freq: Once | INTRAVENOUS | Status: DC

## 2017-07-15 MED ORDER — SODIUM CHLORIDE 0.9 % IV SOLN
Freq: Once | INTRAVENOUS | Status: AC
Start: 2017-07-15 — End: 2017-07-15
  Administered 2017-07-15: 10:00:00 via INTRAVENOUS
  Filled 2017-07-15: qty 1000

## 2017-07-15 MED ORDER — FAMOTIDINE IN NACL 20-0.9 MG/50ML-% IV SOLN: 20.0000 mg | Freq: Once | INTRAVENOUS | Status: DC | PRN

## 2017-07-15 MED ORDER — PALONOSETRON HCL INJECTION 0.25 MG/5ML
0.2500 mg | Freq: Once | INTRAVENOUS | Status: AC
  Administered 2017-07-15: 0.25 mg via INTRAVENOUS
  Filled 2017-07-15: qty 5

## 2017-07-15 MED ORDER — PEGFILGRASTIM 6 MG/0.6ML ~~LOC~~ PSKT
6.0000 mg | PREFILLED_SYRINGE | Freq: Once | SUBCUTANEOUS | Status: DC
  Filled 2017-07-15: qty 0.6

## 2017-07-15 MED ORDER — DEXAMETHASONE SODIUM PHOSPHATE 10 MG/ML IJ SOLN
10.0000 mg | Freq: Once | INTRAMUSCULAR | Status: AC
  Administered 2017-07-15: 10 mg via INTRAVENOUS
  Filled 2017-07-15: qty 1

## 2017-07-15 MED ORDER — ALBUTEROL SULFATE (2.5 MG/3ML) 0.083% IN NEBU
2.5000 mg | INHALATION_SOLUTION | Freq: Once | RESPIRATORY_TRACT | Status: AC | PRN
  Administered 2017-07-15: 2.5 mg via RESPIRATORY_TRACT

## 2017-07-15 MED ORDER — CYCLOPHOSPHAMIDE CHEMO INJECTION 1 GM
600.0000 mg/m2 | Freq: Once | INTRAMUSCULAR | Status: AC
  Administered 2017-07-15: 1660 mg via INTRAVENOUS
  Filled 2017-07-15: qty 83

## 2017-07-15 NOTE — Progress Notes (Signed)
Patient here for pre treatment she reports a sore swollen arm on her right side.

## 2017-07-15 NOTE — Progress Notes (Signed)
1047: Pt reports, flushing "feeling hot", SOB and chest heaviness. Taxotere stopped and NS started to gravity. Beckey Rutter NP and Faythe Casa NP at chair side. Medication given per protocol/NP orders.  1100: Pt reports that symptoms have improved, she is still experiencing a mild amount of chest heaviness.  1105 pt reports symptoms have completely resolved.  Per Faythe Casa NP per Dr. Grayland Ormond discontinue the Taxotere and proceed with Cytoxan.  Pharmacy aware.  1210: Per MD no need for neulasta on pro, okay to discharge pt home, pt stable at discharge.

## 2017-07-16 ENCOUNTER — Encounter: Payer: Self-pay | Admitting: Surgery

## 2017-07-16 ENCOUNTER — Ambulatory Visit (INDEPENDENT_AMBULATORY_CARE_PROVIDER_SITE_OTHER): Payer: Medicaid Other | Admitting: Surgery

## 2017-07-16 VITALS — BP 137/84 | HR 64 | Temp 98.2°F | Wt 352.5 lb

## 2017-07-16 DIAGNOSIS — C50911 Malignant neoplasm of unspecified site of right female breast: Secondary | ICD-10-CM

## 2017-07-16 NOTE — Progress Notes (Signed)
Outpatient postop visit  07/16/2017  Michelle Mcmillan is an 47 y.o. female.    Procedure: Status post l right total mastectomy for breast cancer.  CC: Chemotherapy reaction  HPI: Status post l right total mastectomy for breast cancer.  She is undergoing therapy at this point.  Patient apparently had a reaction to the Taxotere yesterday and had to stop it.  She is also complaining of some pain in her shoulder that radiates down her arm and is having some swelling from her right arm. Patient saw [name] for plastic surgery consultation and is planning to have reconstruction and prophylactic mastectomy on the left sometime after June.  Plastic surgery withdrew approximately 200 cc of serous fluid.  Medications reviewed.    Physical Exam:  There were no vitals taken for this visit.    PE: Right mastectomy site. Site is clean and no erythema or drainage it is healing well.  Right arm is minimally swollen but full function.    Assessment/Plan:  Possible early lymphedema.  Will refer back for physical therapy and lymphedema stocking if indicated.  Patient doing very well at this time she continues with chemotherapy.  I will see her back 1 June to discuss and coordinate planning with Dr. York Grice, MD, FACS

## 2017-07-16 NOTE — Patient Instructions (Signed)
We will send the referral to Physical Therapy for your right arm pain. Someone will call you to schedule an appointment.   Please see your follow up appointment listed below.

## 2017-07-18 ENCOUNTER — Inpatient Hospital Stay: Payer: Medicaid Other

## 2017-07-18 VITALS — BP 126/86 | HR 97 | Temp 96.9°F | Resp 19

## 2017-07-18 DIAGNOSIS — E860 Dehydration: Secondary | ICD-10-CM

## 2017-07-18 DIAGNOSIS — Z5111 Encounter for antineoplastic chemotherapy: Secondary | ICD-10-CM | POA: Diagnosis not present

## 2017-07-18 MED ORDER — SODIUM CHLORIDE 0.9 % IV SOLN
INTRAVENOUS | Status: DC
  Administered 2017-07-18: 12:00:00 via INTRAVENOUS
  Filled 2017-07-18 (×2): qty 1000

## 2017-07-23 ENCOUNTER — Encounter: Payer: Self-pay | Admitting: Occupational Therapy

## 2017-07-23 ENCOUNTER — Other Ambulatory Visit: Payer: Self-pay

## 2017-07-23 ENCOUNTER — Ambulatory Visit: Payer: Medicaid Other | Attending: Oncology | Admitting: Occupational Therapy

## 2017-07-23 DIAGNOSIS — M25511 Pain in right shoulder: Secondary | ICD-10-CM

## 2017-07-23 DIAGNOSIS — M6281 Muscle weakness (generalized): Secondary | ICD-10-CM

## 2017-07-23 DIAGNOSIS — I972 Postmastectomy lymphedema syndrome: Secondary | ICD-10-CM

## 2017-07-23 NOTE — Therapy (Signed)
[facility] [address], [place], [address] Phone: [phone]   Fax:  [phone]  Occupational Therapy Evaluation  Patient Details  Name: Michelle Mcmillan MRN: [id number] Date of Birth: March 07, 1971 Referring Provider: Grayland Ormond   Encounter Date: 07/23/2017  OT End of Session - 07/23/17 2005    Visit Number  1    Number of Visits  12    Date for OT Re-Evaluation  09/03/17    OT Start Time  1301    OT Stop Time  1350    OT Time Calculation (min)  49 min    Activity Tolerance  Patient tolerated treatment well    Behavior During Therapy  [facility] for tasks assessed/performed       Past Medical History:  Diagnosis Date  . Anemia    H/O  . Arthritis    RIGHT KNEE  . Breast cancer ([place]) 2017   right breast  . Cancer ([place])    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)  . GERD (gastroesophageal reflux disease)    NO MEDS  . Headache    migraines  . Hypertension   . Last menstrual period (LMP) > 10 days ago 08/2015  . Personal history of radiation therapy     Past Surgical History:  Procedure Laterality Date  . BREAST BIOPSY Right 05/08/2017   invasive mamm. ca  . BREAST EXCISIONAL BIOPSY Right 06/15/15   breast cancer  . BREAST LUMPECTOMY WITH NEEDLE LOCALIZATION Right 06/15/2015   Procedure: BREAST LUMPECTOMY WITH NEEDLE LOCALIZATION;  Surgeon: Hubbard Robinson, MD;  Location: [facility] ORS;  Service: General;  Laterality: Right;  . BREAST SURGERY    . CESAREAN SECTION  1988   x1  . COLONOSCOPY WITH PROPOFOL N/A 01/23/2017   Procedure: COLONOSCOPY WITH PROPOFOL;  Surgeon: Lin Landsman, MD;  Location: [facility] ENDOSCOPY;  Service: Gastroenterology;  Laterality: N/A;  . ESOPHAGOGASTRODUODENOSCOPY (EGD) WITH PROPOFOL N/A 01/23/2017   Procedure: ESOPHAGOGASTRODUODENOSCOPY (EGD) WITH PROPOFOL;  Surgeon: Lin Landsman, MD;  Location: [facility] ENDOSCOPY;  Service: Gastroenterology;  Laterality: N/A;  . MASS EXCISION N/A  05/20/2016   Procedure: EXCISION CHEST WALL MASS;  Surgeon: Florene Glen, MD;  Location: [facility] ORS;  Service: General;  Laterality: N/A;  . SENTINEL NODE BIOPSY Right 06/15/2015   Procedure: SENTINEL NODE BIOPSY;  Surgeon: Hubbard Robinson, MD;  Location: [facility] ORS;  Service: General;  Laterality: Right;  . TOTAL MASTECTOMY Right 05/22/2017   Procedure: TOTAL MASTECTOMY;  Surgeon: Florene Glen, MD;  Location: [facility] ORS;  Service: General;  Laterality: Right;  . TUBAL LIGATION  2004    There were no vitals filed for this visit.  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  Yes    Pain Score  6     Pain Location  Axilla shoulder     Pain Orientation  Right    Pain Descriptors / Indicators  Aching;Throbbing;[place] Ard  Pain Type  Acute pain    Pain Onset  1 to 4 weeks ago        [facility] OT Assessment - 07/23/17 0001      Assessment   Medical Diagnosis  R UE lymphedema and shoulder pain     Referring Provider  Finnegan    Onset Date/Surgical Date  05/22/17    Hand Dominance  Right      Balance Screen   Has the patient fallen in the past 6 months  No      Home  Environment   Lives With  Family      Prior Function   Vocation  Full time employment    Leisure  not working now , Work as Corporate treasurer , like to [employer] , fish , do work puzzles and read       AROM   Right Shoulder Extension  45 Degrees    Right Shoulder Flexion  120 Degrees    Right Shoulder ABduction  85 Degrees    Left Shoulder Extension  60 Degrees    Left Shoulder Flexion  160 Degrees    Left Shoulder ABduction  155 Degrees        LYMPHEDEMA/ONCOLOGY QUESTIONNAIRE - 07/23/17 1309      Right Upper Extremity Lymphedema   15 cm Proximal to Olecranon Process  47 cm    10 cm Proximal to Olecranon Process  44.4 cm    Olecranon Process  34.8 cm    15 cm Proximal to Ulnar Styloid Process  32 cm    10 cm Proximal to Ulnar Styloid Process  27.5 cm    Just Proximal to Ulnar Styloid Process  21 cm    Across Hand at [employer]  21.5 cm    At [place] of 2nd Digit  6.4 cm    At [facility] of Thumb  6.4 cm      Left Upper Extremity Lymphedema   15 cm Proximal to Olecranon Process  47.5 cm    10 cm Proximal to Olecranon Process  44 cm    Olecranon Process  34.1 cm    15 cm Proximal to Ulnar Styloid Process  32.4 cm    10 cm Proximal to Ulnar Styloid Process  28 cm    Just Proximal to Ulnar Styloid Process  21 cm    Across Hand at [employer]  21.4 cm    At [place] of 2nd Digit  6.5 cm    At [facility] of Thumb  6.5 cm       Review HEP   Scapula squeezes  PROM in supine for shoulder flexion - pain free range AROM ext rotaion with elbow to side 2 x day  10 reps   Ice to shoulder  Keep R arm supported and out to side   avoid cradling and crossing or reaching infront of body   Sleep with arm support  And get over the counter compression sleeve and glove  And jovipak unilateral breast pad              OT Education - 07/23/17 2004    Education provided  Yes    Education Details  Findings of eval , HEP for ROM , compression sleeve and glove get - and jovipak     Person(s) Educated  Patient    Methods  Explanation;Demonstration;Tactile cues    Comprehension  Verbalized understanding;Returned demonstration       OT Short Term Goals - 07/23/17  2011      OT SHORT TERM GOAL #1   Title  Pain in R shoulder and under arm  decrease to less than 3/10 at the worse to sleep better     Baseline  pain can increase to 6-7/10 at the worse and best 3/10     Time  3    Period  Weeks    Status  New    Target Date   08/13/17      OT SHORT TERM GOAL #2   Title  Pt to be independent in HEP to wear compression sleeve and glove to maintain circumference in R UE     Baseline  no compression and pt report arm increase in swelling towards the end of day - and sleeves is tight and cannot get jewelry on     Time  2    Period  Weeks    Status  New    Target Date  08/06/17        OT Long Term Goals - 07/23/17 2111      OT LONG TERM GOAL #1   Title  R shoulder AROM improve for pt to reach over head , and donn/doff  sweater over head without increase  symptoms     Baseline  R shoulder flexion 120 , ABD 85 and ext 45     Time  4    Period  Weeks    Status  New    Target Date  08/20/17      OT LONG TERM GOAL #2   Title  R shoulder strength increase for pt do house work , carry more than 5 lbs without increase symptoms     Baseline  pain with AROM to 85 ABD ,shoulder flexion 120 - cannot tolerate resistance and tender over bicep and supraspinatus - as well as upper traps     Time  6    Period  Weeks    Status  New    Target Date  09/03/17            Plan - 07/23/17 2006    Clinical Impression Statement  Pt present at OT eval with diagnosis of R UE lymphedema and shoulder pain - pt had lumpectomy with radiation in 2017 on the R , with new primary onset of breast CA in R - pt had  mastectomy  in 05/22/17 and  having chemo - pt since about end of March report increase  swelling in R UE  towards end of day  and increase shoulder pain - cannot reach over head , pain at night time  in shoulder and under arm - limiting her functional use of R  dominant arm in ADL's and IADlL's - pt show increase pain and tendnerenss over bicep and supraspinatus , tenderness over upper trap - decrease ROM and strength in R  shoulder , circumference in   R UE  is increase but not more than 1 cm - so recommend at this time over the counter compression slseve and glove to use with higth risk actitivities - pt do have increase  lymphedema under arm - and scar tissue that is adhere , pain and tenderness - pt can benefit from OT services     Occupational performance deficits (Please refer to evaluation for details):  ADL's;IADL's    Rehab Potential  Good    OT Frequency  2x / week    OT Duration  6 weeks    OT Treatment/Interventions  Self-care/ADL training;Therapeutic exercise;Manual lymph drainage;Manual Therapy;Passive range of motion;Scar mobilization;Patient/family education    Plan  assess progress with HEP     Clinical Decision Making  Several treatment options, min-mod task modification necessary    OT Home Exercise Plan  see pt instruction     Consulted and Agree with Plan of Care  Patient       Patient will benefit from skilled therapeutic intervention in order to improve the following deficits and impairments:  Pain, Increased edema, Impaired flexibility, Decreased scar mobility, Decreased strength, Decreased range of motion, Impaired UE functional use  Visit Diagnosis: Postmastectomy lymphedema syndrome - Plan: Ot plan of care cert/re-cert  Acute pain of right shoulder - Plan: Ot plan of care cert/re-cert  Muscle weakness (generalized) - Plan: Ot plan of care cert/re-cert    Problem List Patient Active Problem List   Diagnosis Date Noted  . Breast cancer ([place]) 05/22/2017  . Morbid (severe) obesity due to excess calories ([place]) 04/15/2017  . Microcytic anemia 04/15/2017  . GERD (gastroesophageal reflux disease) 04/15/2017  . Chest wall mass   . Malignant neoplasm of upper-outer quadrant of right female breast ([place]) 05/25/2015  . HTN (hypertension) 05/24/2015    Rosalyn Gess OTR/L,CLT 07/23/2017, 9:24 PM  [facility] [address] [place], [address] Phone: [phone]   Fax:  [phone]  Name: Michelle Mcmillan MRN: [id number] Date of Birth: February 22, 1971

## 2017-07-23 NOTE — Patient Instructions (Signed)
Scapula squeezes  PROM in supine for shoulder flexion - pain free range AROM ext rotaion with elbow to side 2 x day  10 reps   Ice to shoulder  Keep R arm supported and out to side   avoid cradling and crossing or reaching infront of body   Sleep with arm support  And get over the counter compression sleeve and glove  And jovipak unilateral breast pad

## 2017-08-01 ENCOUNTER — Ambulatory Visit: Payer: Medicaid Other | Admitting: Occupational Therapy

## 2017-08-01 NOTE — Progress Notes (Signed)
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   INTERVAL HISTORY: Patient returns to clinic today for further evaluation and consideration of cycle 3 of Taxotere and Cytoxan.  She had a reaction to her Taxotere during cycle 2 treatment was discontinued early.  She tolerated her second infusion better without significant side effects.  She is anxious, but otherwise feels well.  She has no neurologic complaints. She denies any recent fevers or illnesses. She has a good appetite and denies weight loss. She has no chest pain or shortness of breath.  She denies any nausea, vomiting, constipation, or diarrhea.  She has no urinary complaints.  Patient offers no specific complaints today.  REVIEW OF SYSTEMS:   Review of Systems  Constitutional: Negative.  Negative for fever, malaise/fatigue and weight loss.  Respiratory: Negative.  Negative for cough and shortness of breath.   Cardiovascular: Negative.  Negative for chest pain and leg swelling.  Gastrointestinal: Negative for abdominal pain, constipation, diarrhea, nausea and vomiting.  Genitourinary: Negative.  Negative for dysuria.  Musculoskeletal: Negative.  Negative for myalgias.  Skin: Negative.  Negative for rash.  Neurological: Negative.  Negative for sensory change, focal weakness and weakness.  Psychiatric/Behavioral: The patient is nervous/anxious.     As per HPI. Otherwise, a complete review of systems is negative.  PAST MEDICAL HISTORY: Past Medical History:  Diagnosis Date  . Anemia    H/O  . Arthritis    RIGHT  KNEE  . Breast cancer ([place]) 2017   right breast  . Cancer ([place])    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)  . GERD (gastroesophageal reflux disease)    NO MEDS  . Headache    migraines  . Hypertension   . Last menstrual period (LMP) > 10 days ago 08/2015  . Personal history of radiation therapy     PAST SURGICAL HISTORY: Past Surgical History:  Procedure Laterality Date  . BREAST BIOPSY Right 05/08/2017   invasive mamm. ca  . BREAST EXCISIONAL BIOPSY Right 06/15/15   breast cancer  . BREAST LUMPECTOMY WITH NEEDLE LOCALIZATION Right 06/15/2015   Procedure: BREAST LUMPECTOMY WITH NEEDLE LOCALIZATION;  Surgeon: Hubbard Robinson, MD;  Location: [facility] ORS;  Service: General;  Laterality: Right;  . BREAST SURGERY    . CESAREAN SECTION  1988   x1  . COLONOSCOPY WITH PROPOFOL N/A 01/23/2017   Procedure: COLONOSCOPY WITH PROPOFOL;  Surgeon: Lin Landsman, MD;  Location: [facility] ENDOSCOPY;  Service: Gastroenterology;  Laterality: N/A;  . ESOPHAGOGASTRODUODENOSCOPY (EGD) WITH PROPOFOL N/A 01/23/2017   Procedure: ESOPHAGOGASTRODUODENOSCOPY (EGD) WITH PROPOFOL;  Surgeon: Lin Landsman, MD;  Location: [facility] ENDOSCOPY;  Service: Gastroenterology;  Laterality: N/A;  . MASS EXCISION N/A 05/20/2016   Procedure: EXCISION CHEST WALL MASS;  Surgeon: Florene Glen, MD;  Location: [facility] ORS;  Service: General;  Laterality: N/A;  . SENTINEL NODE BIOPSY Right 06/15/2015   Procedure: SENTINEL NODE BIOPSY;  Surgeon: Hubbard Robinson, MD;  Location: [facility] ORS;  Service: General;  Laterality: Right;  . TOTAL MASTECTOMY Right 05/22/2017   Procedure: TOTAL MASTECTOMY;  Surgeon: Florene Glen, MD;  Location: [facility] ORS;  Service:  General;  Laterality: Right;  . TUBAL LIGATION  2004    FAMILY HISTORY Family History  Problem Relation Age of Onset  . Hypertension Mother   . Hypertension Father   . Hypertension Sister   . Breast cancer Neg Hx        ADVANCED DIRECTIVES:    HEALTH MAINTENANCE: Social  History   Tobacco Use  . Smoking status: Never Smoker  . Smokeless tobacco: Never Used  Substance Use Topics  . Alcohol use: Yes    Comment: WINE ONCE MONTH  . Drug use: No    Allergies  Allergen Reactions  . Nitroglycerin Other (See Comments)    headache    Current Outpatient Medications  Medication Sig Dispense Refill  . Calcium Carb-Cholecalciferol (CALCIUM-VITAMIN D) 500-200 MG-UNIT tablet Take by mouth.    . diphenoxylate-atropine (LOMOTIL) 2.5-0.025 MG tablet Take 1 tablet by mouth 4 (four) times daily as needed for diarrhea or loose stools. 30 tablet 0  . Ferrous Sulfate (IRON) 325 (65 Fe) MG TABS Take 1 tablet (325 mg total) by mouth 2 (two) times daily. 60 each 2  . hydrochlorothiazide (HYDRODIURIL) 25 MG tablet Take 25 mg by mouth daily.    Marland Kitchen HYDROcodone-acetaminophen (NORCO/VICODIN) 5-325 MG tablet Take 1-2 tablets by mouth every 4 (four) hours as needed for moderate pain. 30 tablet 0  . Liniments (BLUE-EMU SUPER STRENGTH EX) Apply 1 application topically daily as needed (for pain).    Marland Kitchen omeprazole (PRILOSEC) 20 MG capsule Take 1 capsule (20 mg total) by mouth daily. 30 capsule 3  . ondansetron (ZOFRAN) 8 MG tablet Take 1 tablet (8 mg total) by mouth 2 (two) times daily as needed for refractory nausea / vomiting. 30 tablet 2  . potassium chloride SA (K-DUR,KLOR-CON) 20 MEQ tablet Take 1 tablet (20 mEq total) by mouth 2 (two) times daily. 30 tablet 1  . prochlorperazine (COMPAZINE) 10 MG tablet Take 1 tablet (10 mg total) by mouth every 6 (six) hours as needed (Nausea or vomiting). 60 tablet 2  . tetrahydrozoline 0.05 % ophthalmic solution Place 2 drops into both eyes daily.     No current facility-administered medications for this visit.     OBJECTIVE: Vitals:   08/05/17 0940  BP: 136/85  Pulse: 84  Resp: 18  Temp: (!) 97.4 F (36.3 C)     Body mass index is 52.3 kg/m.    ECOG FS:0 - Asymptomatic  General: Well-developed, well-nourished, no acute  distress. Eyes: Pink conjunctiva, anicteric sclera. Breast: Right mastectomy Lungs: Clear to auscultation bilaterally. Heart: Regular rate and rhythm. No rubs, murmurs, or gallops. Abdomen: Soft, nontender, nondistended. No organomegaly noted, normoactive bowel sounds. Musculoskeletal: No edema, cyanosis, or clubbing. Neuro: Alert, answering all questions appropriately. Cranial nerves grossly intact. Skin: No rashes or petechiae noted. Psych: Normal affect.  LAB RESULTS:  Lab Results  Component Value Date   NA 138 08/05/2017   K 3.1 (L) 08/05/2017   CL 101 08/05/2017   CO2 25 08/05/2017   GLUCOSE 138 (H) 08/05/2017   BUN 14 08/05/2017   CREATININE 1.07 (H) 08/05/2017   CALCIUM 8.8 (L) 08/05/2017   PROT 7.4 08/05/2017   ALBUMIN 3.2 (L) 08/05/2017   AST 31 08/05/2017   ALT 20 08/05/2017   ALKPHOS 89 08/05/2017   BILITOT 0.5 08/05/2017   GFRNONAA >60 08/05/2017   GFRAA >60 08/05/2017    Lab Results  Component Value Date   WBC 6.5 08/05/2017   NEUTROABS 5.3 08/05/2017   HGB 11.3 (L)  08/05/2017   HCT 34.2 (L) 08/05/2017   MCV 79.3 (L) 08/05/2017   PLT 241 08/05/2017     STUDIES: No results found.  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 BRCA 1 and 2 negative, Oncotype DX score 17 which is considered low risk.  PLAN:    1. 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  This occurred while patient was taking Aromasin, therefore will likely switch to letrozole after completion of chemotherapy.  Oncotype was not sent on her second breast cancer.  Plan to give adjuvant chemotherapy using 4-6 cycles of Taxotere and Cytoxan every 3 weeks.  CT scans and bone scans reviewed  independently with no evidence of metastatic disease confirming stage I malignancy.  Proceed with cycle 3 of Taxotere and Cytoxan today.  Because of her reaction to cycle 2, will consider adding 1 or 2 more infusions.  Return to clinic again on Thursday for IV fluids.  Patient will then return to clinic in 3 weeks for further evaluation and consideration of cycle 4.   2. Genetic testing: Patient was found to be BRCA 1 and 2 negative. She expressed understanding that although her genetic testing was negative, her children are at higher risk for breast cancer than the general population.   3. Bone mineral density: Bone density from September 04, 2016 reported Z score of 1.0 which is considered normal. Repeat in June 2020. 4.  Hypokalemia: Potassium 3.1 today.  Continue oral potassium supplementation. 5.  Diarrhea: Patient does not complain of this today.  Continue Imodium as needed. 6.  Right arm pain/swelling: Patient was previously given a referral to lymphedema clinic. 7.  Reaction to Taxotere: Patient was given additional premedications and instructed nursing not to increase rate during infusion.  Approximately 30 minutes spent in discussion of which greater than 50% was consultation.   Patient expressed understanding and was in agreement with this plan. She also understands that She can call clinic at any time with any questions, concerns, or complaints.    Lloyd Huger, MD 08/10/17 7:08 AM

## 2017-08-05 ENCOUNTER — Other Ambulatory Visit: Payer: Self-pay

## 2017-08-05 ENCOUNTER — Inpatient Hospital Stay: Payer: Medicaid Other

## 2017-08-05 ENCOUNTER — Inpatient Hospital Stay: Payer: Medicaid Other | Attending: Oncology

## 2017-08-05 ENCOUNTER — Inpatient Hospital Stay (HOSPITAL_BASED_OUTPATIENT_CLINIC_OR_DEPARTMENT_OTHER): Payer: Medicaid Other | Admitting: Oncology

## 2017-08-05 ENCOUNTER — Encounter: Payer: Self-pay | Admitting: Oncology

## 2017-08-05 VITALS — BP 136/85 | HR 84 | Temp 97.4°F | Resp 18 | Wt 354.1 lb

## 2017-08-05 VITALS — BP 117/72 | HR 73 | Temp 98.1°F | Resp 18

## 2017-08-05 DIAGNOSIS — E860 Dehydration: Secondary | ICD-10-CM | POA: Insufficient documentation

## 2017-08-05 DIAGNOSIS — M7989 Other specified soft tissue disorders: Secondary | ICD-10-CM

## 2017-08-05 DIAGNOSIS — E876 Hypokalemia: Secondary | ICD-10-CM | POA: Insufficient documentation

## 2017-08-05 DIAGNOSIS — Z170 Estrogen receptor positive status [ER+]: Secondary | ICD-10-CM | POA: Diagnosis not present

## 2017-08-05 DIAGNOSIS — C50411 Malignant neoplasm of upper-outer quadrant of right female breast: Secondary | ICD-10-CM | POA: Diagnosis not present

## 2017-08-05 DIAGNOSIS — Z9013 Acquired absence of bilateral breasts and nipples: Secondary | ICD-10-CM

## 2017-08-05 DIAGNOSIS — I10 Essential (primary) hypertension: Secondary | ICD-10-CM | POA: Diagnosis not present

## 2017-08-05 DIAGNOSIS — Z923 Personal history of irradiation: Secondary | ICD-10-CM | POA: Diagnosis not present

## 2017-08-05 DIAGNOSIS — Z5111 Encounter for antineoplastic chemotherapy: Secondary | ICD-10-CM | POA: Diagnosis present

## 2017-08-05 DIAGNOSIS — R450 Nervousness: Secondary | ICD-10-CM

## 2017-08-05 DIAGNOSIS — R112 Nausea with vomiting, unspecified: Secondary | ICD-10-CM | POA: Insufficient documentation

## 2017-08-05 LAB — COMPREHENSIVE METABOLIC PANEL
ALBUMIN: 3.2 g/dL — AB (ref 3.5–5.0)
ALK PHOS: 89 U/L (ref 38–126)
ALT: 20 U/L (ref 14–54)
ANION GAP: 12 (ref 5–15)
AST: 31 U/L (ref 15–41)
BUN: 14 mg/dL (ref 6–20)
CALCIUM: 8.8 mg/dL — AB (ref 8.9–10.3)
CHLORIDE: 101 mmol/L (ref 101–111)
CO2: 25 mmol/L (ref 22–32)
Creatinine, Ser: 1.07 mg/dL — ABNORMAL HIGH (ref 0.44–1.00)
GFR calc non Af Amer: >60 mL/min (ref >60)
GLUCOSE: 138 mg/dL — AB (ref 65–99)
Potassium: 3.1 mmol/L — ABNORMAL LOW (ref 3.5–5.1)
SODIUM: 138 mmol/L (ref 135–145)
Total Bilirubin: 0.5 mg/dL (ref 0.3–1.2)
Total Protein: 7.4 g/dL (ref 6.5–8.1)

## 2017-08-05 LAB — CBC WITH DIFFERENTIAL/PLATELET
BASOS PCT: 1 %
Basophils Absolute: 0 10*3/uL (ref 0–0.1)
Eosinophils Absolute: 0.1 10*3/uL (ref 0–0.7)
Eosinophils Relative: 2 %
HEMATOCRIT: 34.2 % — AB (ref 35.0–47.0)
HEMOGLOBIN: 11.3 g/dL — AB (ref 12.0–16.0)
LYMPHS ABS: 0.7 10*3/uL — AB (ref 1.0–3.6)
Lymphocytes Relative: 11 %
MCH: 26.3 pg (ref 26.0–34.0)
MCHC: 33.1 g/dL (ref 32.0–36.0)
MCV: 79.3 fL — ABNORMAL LOW (ref 80.0–100.0)
MONO ABS: 0.4 10*3/uL (ref 0.2–0.9)
MONOS PCT: 6 %
NEUTROS ABS: 5.3 10*3/uL (ref 1.4–6.5)
NEUTROS PCT: 80 %
Platelets: 241 10*3/uL (ref 150–440)
RBC: 4.31 MIL/uL (ref 3.80–5.20)
RDW: 19.4 % — AB (ref 11.5–14.5)
WBC: 6.5 10*3/uL (ref 3.6–11.0)

## 2017-08-05 MED ORDER — DIPHENHYDRAMINE HCL 50 MG/ML IJ SOLN
50.0000 mg | Freq: Once | INTRAMUSCULAR | Status: AC
  Administered 2017-08-05: 50 mg via INTRAVENOUS
  Filled 2017-08-05: qty 1

## 2017-08-05 MED ORDER — SODIUM CHLORIDE 0.9 % IV SOLN
Freq: Once | INTRAVENOUS | Status: AC
  Administered 2017-08-05: 11:00:00 via INTRAVENOUS
  Filled 2017-08-05: qty 1000

## 2017-08-05 MED ORDER — PALONOSETRON HCL INJECTION 0.25 MG/5ML
0.2500 mg | Freq: Once | INTRAVENOUS | Status: AC
  Administered 2017-08-05: 0.25 mg via INTRAVENOUS
  Filled 2017-08-05: qty 5

## 2017-08-05 MED ORDER — SODIUM CHLORIDE 0.9 % IV SOLN
600.0000 mg/m2 | Freq: Once | INTRAVENOUS | Status: AC
  Administered 2017-08-05: 1660 mg via INTRAVENOUS
  Filled 2017-08-05: qty 83

## 2017-08-05 MED ORDER — SODIUM CHLORIDE 0.9 % IV SOLN
20.0000 mg | Freq: Once | INTRAVENOUS | Status: AC
  Administered 2017-08-05: 20 mg via INTRAVENOUS
  Filled 2017-08-05: qty 2

## 2017-08-05 MED ORDER — SODIUM CHLORIDE 0.9 % IV SOLN
75.0000 mg/m2 | Freq: Once | INTRAVENOUS | Status: AC
  Administered 2017-08-05: 210 mg via INTRAVENOUS
  Filled 2017-08-05: qty 21

## 2017-08-05 MED ORDER — PEGFILGRASTIM 6 MG/0.6ML ~~LOC~~ PSKT
6.0000 mg | PREFILLED_SYRINGE | Freq: Once | SUBCUTANEOUS | Status: AC
  Administered 2017-08-05: 6 mg via SUBCUTANEOUS
  Filled 2017-08-05: qty 0.6

## 2017-08-05 MED ORDER — FAMOTIDINE IN NACL 20-0.9 MG/50ML-% IV SOLN
20.0000 mg | Freq: Two times a day (BID) | INTRAVENOUS | Status: DC
  Administered 2017-08-05: 20 mg via INTRAVENOUS
  Filled 2017-08-05: qty 50

## 2017-08-05 NOTE — Progress Notes (Signed)
Pt in for follow up and treatment.  Concerned that she had a reaction last time to taxotere and was unable to get treatment.  Questioning if it is what she"s getting today and if that treatment will be added back to her total overall number of treatments planned.

## 2017-08-05 NOTE — Progress Notes (Signed)
1050: POtassium 3.1, per Tillie Rung RN per Dr. Grayland Ormond pt to continue to take two potassium tablets twice a day at home. Pt verbalizes understanding.  1435: pt tolerated infusion well. Pt stable at discharge.

## 2017-08-05 NOTE — Progress Notes (Signed)
Per Tillie Rung RN per Dr. Grayland Ormond adjustments to be made to Pre medications and Taxotere infusion to be treated as a first time infusion today and every treatment moving forward. Pt verbalizes understanding.

## 2017-08-07 ENCOUNTER — Inpatient Hospital Stay: Payer: Medicaid Other

## 2017-08-07 VITALS — BP 130/84 | HR 79 | Temp 96.2°F | Resp 18

## 2017-08-07 DIAGNOSIS — Z5111 Encounter for antineoplastic chemotherapy: Secondary | ICD-10-CM | POA: Diagnosis not present

## 2017-08-07 DIAGNOSIS — R110 Nausea: Secondary | ICD-10-CM

## 2017-08-07 DIAGNOSIS — E860 Dehydration: Secondary | ICD-10-CM

## 2017-08-07 MED ORDER — SODIUM CHLORIDE 0.9 % IV SOLN: Freq: Once | INTRAVENOUS | Status: DC

## 2017-08-07 MED ORDER — SODIUM CHLORIDE 0.9 % IV SOLN
Freq: Once | INTRAVENOUS | Status: AC
  Administered 2017-08-07: 09:00:00 via INTRAVENOUS
  Filled 2017-08-07: qty 1000

## 2017-08-07 MED ORDER — ONDANSETRON HCL 4 MG/2ML IJ SOLN
8.0000 mg | Freq: Once | INTRAMUSCULAR | Status: AC
  Administered 2017-08-07: 8 mg via INTRAVENOUS
  Filled 2017-08-07: qty 4

## 2017-08-07 MED ORDER — DEXAMETHASONE SODIUM PHOSPHATE 10 MG/ML IJ SOLN
10.0000 mg | Freq: Once | INTRAMUSCULAR | Status: AC
  Administered 2017-08-07: 10 mg via INTRAVENOUS
  Filled 2017-08-07: qty 1

## 2017-08-07 NOTE — Progress Notes (Unsigned)
Patient states, "My Neulasta device malfunctioned. I think I hit it with my bra yesterday morning and that might've messed it up. When the medicine was supposed to be going in yesterday, I noticed the device was wet and leaking. The medicine was dripping down my arm. I do not know if I got any of the medicine. I called the company and was told to bring the device back here." MD, Dr. Grayland Ormond, notified and aware. Per MD order: patient does not need Neulasta injection today, since we are not sure how much/if any of the medication she may have gotten from the Neulasta On-Pro device. Patient educated on Neutropenic precautions and to monitor temperatures at home. Notify clinic if 100.5 Fahrenheit or greater.

## 2017-08-11 ENCOUNTER — Other Ambulatory Visit: Payer: Self-pay | Admitting: *Deleted

## 2017-08-11 ENCOUNTER — Other Ambulatory Visit: Payer: Self-pay

## 2017-08-11 ENCOUNTER — Observation Stay
Admission: AD | Admit: 2017-08-11 | Discharge: 2017-08-12 | Disposition: A | Discharge status: Home / Self Care | Payer: Medicaid Other | Source: Ambulatory Visit | Attending: Internal Medicine | Admitting: Internal Medicine

## 2017-08-11 ENCOUNTER — Ambulatory Visit: Payer: Medicaid Other

## 2017-08-11 ENCOUNTER — Inpatient Hospital Stay (HOSPITAL_BASED_OUTPATIENT_CLINIC_OR_DEPARTMENT_OTHER): Payer: Medicaid Other | Admitting: Nurse Practitioner

## 2017-08-11 ENCOUNTER — Inpatient Hospital Stay: Payer: Medicaid Other

## 2017-08-11 ENCOUNTER — Encounter: Payer: Self-pay | Admitting: Nurse Practitioner

## 2017-08-11 VITALS — BP 131/89 | HR 101 | Temp 97.2°F | Resp 20 | Wt 334.0 lb

## 2017-08-11 DIAGNOSIS — E860 Dehydration: Secondary | ICD-10-CM

## 2017-08-11 DIAGNOSIS — E876 Hypokalemia: Secondary | ICD-10-CM

## 2017-08-11 DIAGNOSIS — Z853 Personal history of malignant neoplasm of breast: Secondary | ICD-10-CM | POA: Diagnosis not present

## 2017-08-11 DIAGNOSIS — R634 Abnormal weight loss: Secondary | ICD-10-CM | POA: Diagnosis not present

## 2017-08-11 DIAGNOSIS — Z170 Estrogen receptor positive status [ER+]: Secondary | ICD-10-CM

## 2017-08-11 DIAGNOSIS — K219 Gastro-esophageal reflux disease without esophagitis: Secondary | ICD-10-CM | POA: Insufficient documentation

## 2017-08-11 DIAGNOSIS — D649 Anemia, unspecified: Secondary | ICD-10-CM | POA: Diagnosis not present

## 2017-08-11 DIAGNOSIS — E46 Unspecified protein-calorie malnutrition: Secondary | ICD-10-CM

## 2017-08-11 DIAGNOSIS — Z79899 Other long term (current) drug therapy: Secondary | ICD-10-CM | POA: Diagnosis not present

## 2017-08-11 DIAGNOSIS — Z9011 Acquired absence of right breast and nipple: Secondary | ICD-10-CM | POA: Diagnosis not present

## 2017-08-11 DIAGNOSIS — D701 Agranulocytosis secondary to cancer chemotherapy: Secondary | ICD-10-CM

## 2017-08-11 DIAGNOSIS — R197 Diarrhea, unspecified: Secondary | ICD-10-CM | POA: Diagnosis not present

## 2017-08-11 DIAGNOSIS — R112 Nausea with vomiting, unspecified: Secondary | ICD-10-CM | POA: Diagnosis not present

## 2017-08-11 DIAGNOSIS — R51 Headache: Secondary | ICD-10-CM

## 2017-08-11 DIAGNOSIS — T451X5A Adverse effect of antineoplastic and immunosuppressive drugs, initial encounter: Secondary | ICD-10-CM | POA: Diagnosis not present

## 2017-08-11 DIAGNOSIS — C50411 Malignant neoplasm of upper-outer quadrant of right female breast: Secondary | ICD-10-CM

## 2017-08-11 DIAGNOSIS — I10 Essential (primary) hypertension: Secondary | ICD-10-CM | POA: Insufficient documentation

## 2017-08-11 DIAGNOSIS — Z5111 Encounter for antineoplastic chemotherapy: Secondary | ICD-10-CM | POA: Diagnosis not present

## 2017-08-11 DIAGNOSIS — Z6841 Body Mass Index (BMI) 40.0 and over, adult: Secondary | ICD-10-CM | POA: Insufficient documentation

## 2017-08-11 HISTORY — DX: Nausea with vomiting, unspecified: R11.2

## 2017-08-11 LAB — CBC WITH DIFFERENTIAL/PLATELET
BASOS PCT: 2 %
Basophils Absolute: 0 10*3/uL (ref 0–0.1)
Eosinophils Absolute: 0 10*3/uL (ref 0–0.7)
Eosinophils Relative: 1 %
HEMATOCRIT: 34.8 % — AB (ref 35.0–47.0)
HEMOGLOBIN: 11.5 g/dL — AB (ref 12.0–16.0)
LYMPHS ABS: 0.5 10*3/uL — AB (ref 1.0–3.6)
Lymphocytes Relative: 44 %
MCH: 25.9 pg — AB (ref 26.0–34.0)
MCHC: 33 g/dL (ref 32.0–36.0)
MCV: 78.5 fL — ABNORMAL LOW (ref 80.0–100.0)
MONOS PCT: 5 %
Monocytes Absolute: 0.1 10*3/uL — ABNORMAL LOW (ref 0.2–0.9)
NEUTROS ABS: 0.5 10*3/uL — AB (ref 1.4–6.5)
NEUTROS PCT: 48 %
Platelets: 246 10*3/uL (ref 150–440)
RBC: 4.44 MIL/uL (ref 3.80–5.20)
RDW: 18.7 % — ABNORMAL HIGH (ref 11.5–14.5)
WBC: 1.1 10*3/uL — CL (ref 3.6–11.0)

## 2017-08-11 LAB — BASIC METABOLIC PANEL
Anion gap: 6 (ref 5–15)
BUN: 14 mg/dL (ref 6–20)
CALCIUM: 8.2 mg/dL — AB (ref 8.9–10.3)
CO2: 27 mmol/L (ref 22–32)
Chloride: 101 mmol/L (ref 101–111)
Creatinine, Ser: 1.01 mg/dL — ABNORMAL HIGH (ref 0.44–1.00)
GFR calc Af Amer: >60 mL/min (ref >60)
GFR calc non Af Amer: >60 mL/min (ref >60)
GLUCOSE: 111 mg/dL — AB (ref 65–99)
POTASSIUM: 3.4 mmol/L — AB (ref 3.5–5.1)
SODIUM: 134 mmol/L — AB (ref 135–145)

## 2017-08-11 LAB — URINALYSIS, ROUTINE W REFLEX MICROSCOPIC
BACTERIA UA: NONE SEEN
BILIRUBIN URINE: NEGATIVE
Glucose, UA: 50 mg/dL — AB
Ketones, ur: NEGATIVE mg/dL
LEUKOCYTES UA: NEGATIVE
Nitrite: NEGATIVE
Protein, ur: NEGATIVE mg/dL
SPECIFIC GRAVITY, URINE: 1.018 (ref 1.005–1.030)
pH: 7 (ref 5.0–8.0)

## 2017-08-11 LAB — COMPREHENSIVE METABOLIC PANEL
ALBUMIN: 3.3 g/dL — AB (ref 3.5–5.0)
ALK PHOS: 82 U/L (ref 38–126)
ALT: 36 U/L (ref 14–54)
ANION GAP: 11 (ref 5–15)
AST: 32 U/L (ref 15–41)
BILIRUBIN TOTAL: 0.7 mg/dL (ref 0.3–1.2)
BUN: 17 mg/dL (ref 6–20)
CHLORIDE: 98 mmol/L — AB (ref 101–111)
CO2: 27 mmol/L (ref 22–32)
Calcium: 8.9 mg/dL (ref 8.9–10.3)
Creatinine, Ser: 1.05 mg/dL — ABNORMAL HIGH (ref 0.44–1.00)
GFR calc non Af Amer: >60 mL/min (ref >60)
Glucose, Bld: 122 mg/dL — ABNORMAL HIGH (ref 65–99)
Potassium: 3.1 mmol/L — ABNORMAL LOW (ref 3.5–5.1)
Sodium: 136 mmol/L (ref 135–145)
Total Protein: 7.2 g/dL (ref 6.5–8.1)

## 2017-08-11 LAB — MAGNESIUM
MAGNESIUM: 2.1 mg/dL (ref 1.7–2.4)
Magnesium: 1.4 mg/dL — ABNORMAL LOW (ref 1.7–2.4)

## 2017-08-11 MED ORDER — ACETAMINOPHEN 650 MG RE SUPP: 650.0000 mg | Freq: Four times a day (QID) | RECTAL | Status: DC | PRN

## 2017-08-11 MED ORDER — ONDANSETRON HCL 4 MG PO TABS: 4.0000 mg | ORAL_TABLET | Freq: Four times a day (QID) | ORAL | Status: DC | PRN

## 2017-08-11 MED ORDER — HYDROCODONE-ACETAMINOPHEN 5-325 MG PO TABS: 1.0000 | ORAL_TABLET | ORAL | Status: DC | PRN

## 2017-08-11 MED ORDER — ACETAMINOPHEN 325 MG PO TABS: 650.0000 mg | ORAL_TABLET | Freq: Four times a day (QID) | ORAL | Status: DC | PRN

## 2017-08-11 MED ORDER — ENOXAPARIN SODIUM 40 MG/0.4ML ~~LOC~~ SOLN
40.0000 mg | SUBCUTANEOUS | Status: DC
  Administered 2017-08-11: 21:00:00 40 mg via SUBCUTANEOUS
  Filled 2017-08-11: qty 0.4

## 2017-08-11 MED ORDER — PROCHLORPERAZINE MALEATE 10 MG PO TABS
10.0000 mg | ORAL_TABLET | Freq: Four times a day (QID) | ORAL | Status: DC | PRN
  Filled 2017-08-11: qty 1

## 2017-08-11 MED ORDER — PROCHLORPERAZINE EDISYLATE 10 MG/2ML IJ SOLN
5.0000 mg | Freq: Once | INTRAMUSCULAR | Status: AC
  Administered 2017-08-11: 5 mg via INTRAVENOUS
  Filled 2017-08-11: qty 2

## 2017-08-11 MED ORDER — DIPHENOXYLATE-ATROPINE 2.5-0.025 MG PO TABS: 1.0000 | ORAL_TABLET | Freq: Four times a day (QID) | ORAL | Status: DC | PRN

## 2017-08-11 MED ORDER — SODIUM CHLORIDE 0.9 % IV SOLN: 10.0000 mg | Freq: Once | INTRAVENOUS | Status: DC

## 2017-08-11 MED ORDER — ALUM & MAG HYDROXIDE-SIMETH 200-200-20 MG/5ML PO SUSP
15.0000 mL | Freq: Once | ORAL | Status: AC
  Administered 2017-08-11: 15 mL via ORAL
  Filled 2017-08-11: qty 30

## 2017-08-11 MED ORDER — POTASSIUM CHLORIDE IN NACL 40-0.9 MEQ/L-% IV SOLN
INTRAVENOUS | Status: DC
  Administered 2017-08-11 – 2017-08-12 (×2): 100 mL/h via INTRAVENOUS
  Filled 2017-08-11 (×5): qty 1000

## 2017-08-11 MED ORDER — SODIUM CHLORIDE 0.9 % IV SOLN
Freq: Once | INTRAVENOUS | Status: AC
  Administered 2017-08-11: 12:00:00 via INTRAVENOUS
  Filled 2017-08-11: qty 1000

## 2017-08-11 MED ORDER — SODIUM CHLORIDE 0.9 % IV SOLN: Freq: Once | INTRAVENOUS | Status: DC

## 2017-08-11 MED ORDER — SODIUM CHLORIDE 0.9% FLUSH
10.0000 mL | Freq: Once | INTRAVENOUS | Status: DC
  Filled 2017-08-11: qty 10

## 2017-08-11 MED ORDER — ALBUTEROL SULFATE (2.5 MG/3ML) 0.083% IN NEBU: 2.5000 mg | INHALATION_SOLUTION | RESPIRATORY_TRACT | Status: DC | PRN

## 2017-08-11 MED ORDER — DEXAMETHASONE SODIUM PHOSPHATE 10 MG/ML IJ SOLN
10.0000 mg | Freq: Once | INTRAMUSCULAR | Status: AC
  Administered 2017-08-11: 10 mg via INTRAVENOUS
  Filled 2017-08-11: qty 1

## 2017-08-11 MED ORDER — ONDANSETRON HCL 4 MG/2ML IJ SOLN: 4.0000 mg | Freq: Four times a day (QID) | INTRAMUSCULAR | Status: DC | PRN

## 2017-08-11 MED ORDER — POTASSIUM CHLORIDE CRYS ER 20 MEQ PO TBCR
20.0000 meq | EXTENDED_RELEASE_TABLET | Freq: Two times a day (BID) | ORAL | Status: DC
  Administered 2017-08-11 – 2017-08-12 (×2): 20 meq via ORAL
  Filled 2017-08-11 (×2): qty 1

## 2017-08-11 MED ORDER — PANTOPRAZOLE SODIUM 40 MG PO TBEC
40.0000 mg | DELAYED_RELEASE_TABLET | Freq: Every day | ORAL | Status: DC
  Administered 2017-08-11 – 2017-08-12 (×2): 40 mg via ORAL
  Filled 2017-08-11 (×3): qty 1

## 2017-08-11 MED ORDER — ONDANSETRON HCL 4 MG/2ML IJ SOLN
8.0000 mg | Freq: Once | INTRAMUSCULAR | Status: AC
  Administered 2017-08-11: 8 mg via INTRAVENOUS
  Filled 2017-08-11: qty 4

## 2017-08-11 MED ORDER — DIPHENOXYLATE-ATROPINE 2.5-0.025 MG PO TABS
1.0000 | ORAL_TABLET | Freq: Once | ORAL | Status: AC
  Administered 2017-08-11: 1 via ORAL
  Filled 2017-08-11: qty 1

## 2017-08-11 MED ORDER — MAGNESIUM SULFATE 4 GM/100ML IV SOLN
4.0000 g | Freq: Once | INTRAVENOUS | Status: DC
  Filled 2017-08-11: qty 100

## 2017-08-11 MED ORDER — NAPHAZOLINE-GLYCERIN 0.012-0.2 % OP SOLN
2.0000 [drp] | Freq: Every day | OPHTHALMIC | Status: DC
  Filled 2017-08-11 (×2): qty 15

## 2017-08-11 MED ORDER — NAPHAZOLINE-PHENIRAMINE 0.025-0.3 % OP SOLN
1.0000 [drp] | Freq: Every day | OPHTHALMIC | Status: DC
  Administered 2017-08-12: 10:00:00 1 [drp] via OPHTHALMIC
  Filled 2017-08-11 (×2): qty 5

## 2017-08-11 MED ORDER — HEPARIN SOD (PORK) LOCK FLUSH 100 UNIT/ML IV SOLN: 500.0000 [IU] | Freq: Once | INTRAVENOUS | Status: DC

## 2017-08-11 NOTE — Progress Notes (Signed)
Nutrition Assessment   Reason for Assessment:   Verbal referral from Beckey Rutter, NP for weight loss, diarrhea, nausea  ASSESSMENT:  47 year old female with breast cancer.  Patient s/p mastectomy and currently receiving chemotherapy. Past medical history of GERD, HTN, breast cancer.   Patient presents to symptom management clinic for diarrhea, nausea, vomiting, dehydration.    Met with patient and husband in symptom management clinic.  Patient reports appetite has been decreased for the last few weeks but especially over the last 4 days with increased diarrhea and nausea.  Husband reports patient only eating bites of foods over the last few days.    Medications for nausea and diarrhea being adjusted today.  Waiting on stool sample.  Nutrition Focused Physical Exam: deferred at this time  Medications: lomotil, calcium and vit D, fe sulfate  Labs: K 3.1, glucose 122, creatine 1.05, mag 1.4  Anthropometrics:   Height: 69 inches Weight: 334 lb today Noted weight on 5/7 354 lb BMI: 49  6% weight loss in the last week, significant   Estimated Energy Needs  Kcals: 2200-2700 calories/d Using actual wt (14-18 kcals/kg) Protein: 99-132 g (Using IBW 1.5-2 g/kg) Fluid: > 2.2 L/d  NUTRITION DIAGNOSIS: Inadequate oral intake related to nausea, vomiting, diarrhea as evidenced by 6% weight loss in the last week and reduced oral intake   MALNUTRITION DIAGNOSIS: continue to monitor   INTERVENTION:  Discussed foods to choose with diarrhea and nausea and vomiting.  Fact sheet provided.   Encouraged small frequent meals Provided samples of pedialyte, ensure clear, boost/ensure samples (encouarage juice type beverages first)     MONITORING, EVALUATION, GOAL: weight trends, intake   NEXT VISIT: as needed, phone f/u in 2 weeks  Arna Luis B. Zenia Resides, [place], [place] Registered Dietitian [phone] (pager)

## 2017-08-11 NOTE — H&P (Signed)
[facility] [place] NAME: Michelle Mcmillan    MR#:  [id number]  DATE OF BIRTH:  1971-02-03  DATE OF ADMISSION:  08/11/2017  PRIMARY CARE PHYSICIAN: Donnie Coffin, MD   REQUESTING/REFERRING PHYSICIAN: Dr. Grayland Ormond  CHIEF COMPLAINT:  No chief complaint on file.  Intractable nausea, vomiting and diarrhea for several days HISTORY OF PRESENT ILLNESS:  Michelle Mcmillan  is a 47 y.o. female with a known history of hypertension, anemia, arthritis, GERD, headache and breast cancer, status post right mastectomy and on chemotherapy.  The patient got chemotherapy on May 7 and started to have intractable nausea, vomiting and diarrhea.  She also complains of generalized weakness, headache and abdominal pain sometimes.  But denies any fever or chills.  She was found hypomagnesemia and hypokalemia.  Oncologist to send the patient for direct admission.  PAST MEDICAL HISTORY:   Past Medical History:  Diagnosis Date  . Anemia    H/O  . Arthritis    RIGHT KNEE  . Breast cancer ([place]) 2017   right breast  . Cancer ([place])    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)  . GERD (gastroesophageal reflux disease)    NO MEDS  . Headache    migraines  . Hypertension   . Last menstrual period (LMP) > 10 days ago 08/2015  . Personal history of radiation therapy     PAST SURGICAL HISTORY:   Past Surgical History:  Procedure Laterality Date  . BREAST BIOPSY Right 05/08/2017   invasive mamm. ca  . BREAST EXCISIONAL BIOPSY Right 06/15/15   breast cancer  . BREAST LUMPECTOMY WITH NEEDLE LOCALIZATION Right 06/15/2015   Procedure: BREAST LUMPECTOMY WITH NEEDLE LOCALIZATION;  Surgeon: Hubbard Robinson, MD;  Location: [facility] ORS;  Service: General;  Laterality: Right;  . BREAST SURGERY    . CESAREAN SECTION  1988   x1  . COLONOSCOPY WITH PROPOFOL N/A 01/23/2017   Procedure: COLONOSCOPY WITH PROPOFOL;  Surgeon: Lin Landsman, MD;  Location: [facility] ENDOSCOPY;  Service:  Gastroenterology;  Laterality: N/A;  . ESOPHAGOGASTRODUODENOSCOPY (EGD) WITH PROPOFOL N/A 01/23/2017   Procedure: ESOPHAGOGASTRODUODENOSCOPY (EGD) WITH PROPOFOL;  Surgeon: Lin Landsman, MD;  Location[facility] ENDOSCOPY;  Service: Gastroenterology;  Laterality: N/A;  . MASS EXCISION N/A 05/20/2016   Procedure: EXCISION CHEST WALL MASS;  Surgeon: Florene Glen, MD;  Location: [facility] ORS;  Service: General;  Laterality: N/A;  . SENTINEL NODE BIOPSY Right 06/15/2015   Procedure: SENTINEL NODE BIOPSY;  Surgeon: Hubbard Robinson, MD;  Location: [facility] ORS;  Service: General;  Laterality: Right;  . TOTAL MASTECTOMY Right 05/22/2017   Procedure: TOTAL MASTECTOMY;  Surgeon: Florene Glen, MD;  Location: [facility] ORS;  Service: General;  Laterality: Right;  . TUBAL LIGATION  2004    SOCIAL HISTORY:   Social History   Tobacco Use  . Smoking status: Never Smoker  . Smokeless tobacco: Never Used  Substance Use Topics  . Alcohol use: Yes    Comment: WINE ONCE MONTH    FAMILY HISTORY:   Family History  Problem Relation Age of Onset  . Hypertension Mother   . Hypertension Father   . Hypertension Sister   . Breast cancer Neg Hx     DRUG ALLERGIES:   Allergies  Allergen Reactions  . Nitroglycerin Other (See Comments)    Headache, n/v.    REVIEW OF SYSTEMS:   Review of Systems  Constitutional: Positive for malaise/fatigue. Negative for chills and fever.  HENT: Negative for  sore throat.   Eyes: Negative for blurred vision and double vision.  Respiratory: Negative for cough, hemoptysis, shortness of breath, wheezing and stridor.   Cardiovascular: Negative for chest pain, palpitations, orthopnea and leg swelling.  Gastrointestinal: Positive for abdominal pain, diarrhea, nausea and vomiting. Negative for blood in stool and melena.  Genitourinary: Negative for dysuria, flank pain and hematuria.  Musculoskeletal: Negative for back pain and joint pain.  Skin: Negative for rash.    Neurological: Positive for headaches. Negative for dizziness, sensory change, focal weakness, seizures, loss of consciousness and weakness.  Endo/Heme/Allergies: Negative for polydipsia.  Psychiatric/Behavioral: Negative for depression. The patient is not nervous/anxious.     MEDICATIONS AT HOME:   Prior to Admission medications   Medication Sig Start Date End Date Taking? Authorizing Provider  diphenoxylate-atropine (LOMOTIL) 2.5-0.025 MG tablet Take 1 tablet by mouth 4 (four) times daily as needed for diarrhea or loose stools. 07/02/17  Yes Jacquelin Hawking, NP  Ferrous Sulfate (IRON) 325 (65 Fe) MG TABS Take 1 tablet (325 mg total) by mouth 2 (two) times daily. 06/25/17  Yes Lloyd Huger, MD  hydrochlorothiazide (HYDRODIURIL) 25 MG tablet Take 25 mg by mouth daily.   Yes [provider]  omeprazole (PRILOSEC) 20 MG capsule Take 1 capsule (20 mg total) by mouth daily. 04/16/17  Yes Vanga, Tally Due, MD  ondansetron (ZOFRAN) 8 MG tablet Take 1 tablet (8 mg total) by mouth 2 (two) times daily as needed for refractory nausea / vomiting. 06/03/17  Yes Lloyd Huger, MD  potassium chloride SA (K-DUR,KLOR-CON) 20 MEQ tablet Take 1 tablet (20 mEq total) by mouth 2 (two) times daily. 07/15/17  Yes Lloyd Huger, MD  prochlorperazine (COMPAZINE) 10 MG tablet Take 1 tablet (10 mg total) by mouth every 6 (six) hours as needed (Nausea or vomiting). 06/03/17  Yes Lloyd Huger, MD  tetrahydrozoline 0.05 % ophthalmic solution Place 2 drops into both eyes daily.   Yes [provider]  Calcium Carb-Cholecalciferol (CALCIUM-VITAMIN D) 500-200 MG-UNIT tablet Take 1 tablet by mouth 2 (two) times daily.     [provider]  HYDROcodone-acetaminophen (NORCO/VICODIN) 5-325 MG tablet Take 1-2 tablets by mouth every 4 (four) hours as needed for moderate pain. 06/10/17   Lloyd Huger, MD  Liniments (BLUE-EMU SUPER STRENGTH EX) Apply 1 application topically daily as  needed (for pain).    [provider]      VITAL SIGNS:  Blood pressure (!) 142/91, pulse 80, temperature 98.2 F (36.8 C), temperature source Oral, SpO2 97 %.  PHYSICAL EXAMINATION:  Physical Exam  GENERAL:  47 y.o.-year-old patient lying in the bed with no acute distress.  Morbid obesity. EYES: Pupils equal, round, reactive to light and accommodation. No scleral icterus. Extraocular muscles intact.  HEENT: Head atraumatic, normocephalic. Oropharynx and nasopharynx clear.  NECK:  Supple, no jugular venous distention. No thyroid enlargement, no tenderness.  LUNGS: Normal breath sounds bilaterally, no wheezing, rales,rhonchi or crepitation. No use of accessory muscles of respiration.  CARDIOVASCULAR: S1, S2 normal. No murmurs, rubs, or gallops.  ABDOMEN: Soft, nontender, nondistended. Bowel sounds present. No organomegaly or mass.  EXTREMITIES: No pedal edema, cyanosis, or clubbing.  NEUROLOGIC: Cranial nerves II through XII are intact. Muscle strength 5/5 in all extremities. Sensation intact. Gait not checked.  PSYCHIATRIC: The patient is alert and oriented x 3.  SKIN: No obvious rash, lesion, or ulcer.   LABORATORY PANEL:   CBC Recent Labs  Lab 08/11/17 1041  WBC 1.1*  HGB 11.5*  HCT 34.8*  PLT 246   ------------------------------------------------------------------------------------------------------------------  Chemistries  Recent Labs  Lab 08/11/17 1041  NA 136  K 3.1*  CL 98*  CO2 27  GLUCOSE 122*  BUN 17  CREATININE 1.05*  CALCIUM 8.9  MG 1.4*  AST 32  ALT 36  ALKPHOS 82  BILITOT 0.7   ------------------------------------------------------------------------------------------------------------------  Cardiac Enzymes No results for input(s): TROPONINI in the last 168 hours. ------------------------------------------------------------------------------------------------------------------  RADIOLOGY:  No results found.    IMPRESSION AND  PLAN:   Intractable nausea vomiting and diarrhea, possible due to chemotherapy. The patient will be placed for observation. Give IV fluid rehydration, supportive care, Zofran as needed.  Hypomagnesemia.  Repeat magnesium, IV magnesium PRN. Hypokalemia.  Continue potassium supplement and follow-up BMP. Leukopenia.  Due to chemo, follow-up CBC and Dr. Grayland Ormond Breast cancer, status post a right mastectomy and chemotherapy.  Follow-up with Dr. Grayland Ormond. Hypertension.  Hold HCTZ due to nausea and vomiting and diarrhea. Morbid obesity. All the records are reviewed and case discussed with ED provider. Management plans discussed with the patient, family and they are in agreement.  CODE STATUS: Full code  TOTAL TIME TAKING CARE OF THIS PATIENT: 45 minutes.    Demetrios Loll M.D on 08/11/2017 at 5:02 PM  Between 7am to 6pm - Pager - [phone]  After 6pm go to [email or web address] - [employer] [facility]  Office  [phone]  CC: Primary care physician; Donnie Coffin, MD   Note: This dictation was prepared with Dragon dictation along with smaller phrase technology. Any transcriptional errors that result from this process are unin

## 2017-08-11 NOTE — Progress Notes (Signed)
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 Grayland Ormond, on 08/05/2017.  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  Pathology from mastectomy specimen on 05/22/2017 indicates that this is most likely a second primary and not a local recurrence of disease.  This occurred while patient was taking Aromasin and per Dr. Gary Fleet note anticipate switching to letrozole after completion of chemotherapy.  Plan for patient to receive adjuvant chemotherapy using 4-6 cycles of Taxotere and Cytoxan every 3 weeks.  Previously CT scans and bone scans showed no evidence of metastatic disease confirming stage I malignancy.  BRCA1/2 negative. Patient has not tolerated chemotherapy well and has required IV fluids and supportive care throughout.   Interval history-  Michelle Mcmillan reports diarrhea. She reports diarrhea started 4 days ago occurring multiple times throughout the day. Stool volume is moderate and described as watery. She had similar symptoms after her previous cycle of chemotherapy.  Associated symptoms: Nausea, weakness, headache, intermittent abdominal pain.  Denies fever or chills.  Reports significantly reduced oral intake. Symptoms not improved with Lomotil or Imodium. Also taking compazine for nausea without improvement.     ECOG FS:1 - Symptomatic but completely ambulatory  Review of systems- Review of Systems  Constitutional: Positive for malaise/fatigue and weight loss. Negative for chills, diaphoresis and fever.  HENT: Negative.   Eyes: Negative.   Respiratory: Negative.   Cardiovascular: Negative.   Gastrointestinal: Positive for abdominal pain, diarrhea, nausea and vomiting. Negative for blood in stool, constipation, heartburn and melena.  Genitourinary: Negative.   Musculoskeletal: Negative.   Skin: Negative.   Neurological: Positive for weakness and headaches.  Endo/Heme/Allergies: Negative.   Psychiatric/Behavioral: The patient has insomnia (not sleeping d/t symptoms). The patient is not nervous/anxious.     Current treatment- 08/05/17 cycle 3 taxotere & cytoxan. Neulasta on pro on 08/07/17.   Allergies  Allergen Reactions  . Nitroglycerin Other (See Comments)    headache    Past Medical History:  Diagnosis Date  . Anemia    H/O  . Arthritis    RIGHT KNEE  . Breast cancer ([place]) 2017   right breast  . Cancer ([place])    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)  . GERD (gastroesophageal reflux disease)    NO MEDS  . Headache    migraines  . Hypertension   . Last menstrual period (LMP) > 10 days ago 08/2015  . Personal history of radiation therapy      Past Surgical History:  Procedure Laterality Date  . BREAST BIOPSY Right 05/08/2017   invasive mamm. ca  . BREAST EXCISIONAL BIOPSY Right 06/15/15   breast cancer  . BREAST LUMPECTOMY WITH NEEDLE LOCALIZATION Right 06/15/2015   Procedure: BREAST LUMPECTOMY WITH NEEDLE LOCALIZATION;  Surgeon: Hubbard Robinson, MD;  Location: [facility]S;  Service: General;  Laterality: Right;  . BREAST SURGERY    . CESAREAN SECTION  1988   x1  . COLONOSCOPY WITH PROPOFOL N/A 01/23/2017   Procedure: COLONOSCOPY WITH PROPOFOL;  Surgeon: Vanga, Rohini Reddy, MD;  Location: [facility] END[facility]COPY;  Service: Gastroenterology;  Laterality: N/A;  . ESOPHAGOGASTRODUODENOSCOPY  (EGD) WITH PROPOFOL N/A 01/23/2017   Procedure: ESOPHAGOGASTRODUODENOSCOPY (EGD) WITH PROPOFOL;  Surgeon: Vanga, Rohini Reddy, MD;  Location: [facility];  Service: Gastroenterology;  Laterality: N/A;  . MASS EXCISION N/A 05/20/2016   Procedure: EXCISION CHEST WALL MASS;  Surgeon: Richard E Cooper, MD;  Location: [facility];  Service: General;  Laterality: N/A;  . SENTINEL NODE BIOPSY Right 06/15/2015   Procedure: SENTINEL NODE BIOPSY;  Surgeon: Catherine L Loflin, MD;  Location: [facility];  Service: General;  Laterality: Right;  . TOTAL MASTECTOMY Right 05/22/2017   Procedure: TOTAL MASTECTOMY;  Surgeon: Cooper, Richard E, MD;  Location: [facility];  Service: General;  Laterality: Right;  . TUBAL LIGATION  2004    Social History   Socioeconomic History  . Marital status: Married    Spouse name: Not on file  . Number of children: Not on file  . Years of education: Not on file  . Highest education level: Not on file  Occupational History  . Not on file  Social Needs  . Financial resource strain: Not on file  . Food insecurity:    Worry: Not on file    Inability: Not on file  . Transportation needs:    Medical: Not on file    Non-medical: Not on file  Tobacco Use  . Smoking status: Never Smoker  . Smokeless tobacco: Never Used  Substance and Sexual Activity  . Alcohol use: Yes    Comment: WINE ONCE MONTH  . Drug use: No  . Sexual activity: Not on file  Lifestyle  . Physical activity:    Days per week: Not on file    Minutes per session: Not on file  . Stress: Not on file  Relationships  . Social connections:    Talks on phone: Not on file    Gets together: Not on file    Attends religious service: Not on file    Active member of club or organization: Not on file    Attends meetings of clubs or organizations: Not on file    Relationship status: Not on file  . Intimate partner violence:    Fear of current or ex partner: Not on file    Emotionally abused: Not on file     Physically abused: Not on file    Forced sexual activity: Not on file  Other Topics Concern  . Not on file  Social History Narrative  . Not on file    Family History  Problem Relation Age of Onset  . Hypertension Mother   . Hypertension Father   . Hypertension Sister   . Breast cancer Neg Hx      Current Outpatient Medications:  .  Calcium Carb-Cholecalciferol (CALCIUM-VITAMIN D) 500-200 MG-UNIT tablet, Take by mouth., Disp: , Rfl:  .  diphenoxylate-atropine (LOMOTIL) 2.5-0.025 MG tablet, Take 1 tablet by mouth 4 (four) times daily as needed for diarrhea or loose stools., Disp: 30 tablet, Rfl: 0 .  Ferrous Sulfate (IRON) 325 (65 Fe) MG TABS, Take 1 tablet (325 mg total) by mouth 2 (two) times daily., Disp: 60 each, Rfl: 2 .  hydrochlorothiazide (HYDRODIURIL) 25 MG tablet, Take 25 mg by mouth daily., Disp: , Rfl:  .  HYDROcodone-acetaminophen (NORCO/VICODIN) 5-325 MG tablet, Take 1-2 tablets by mouth every 4 (four)   hours as needed for moderate pain., Disp: 30 tablet, Rfl: 0 .  Liniments (BLUE-EMU SUPER STRENGTH EX), Apply 1 application topically daily as needed (for pain)., Disp: , Rfl:  .  omeprazole (PRILOSEC) 20 MG capsule, Take 1 capsule (20 mg total) by mouth daily., Disp: 30 capsule, Rfl: 3 .  ondansetron (ZOFRAN) 8 MG tablet, Take 1 tablet (8 mg total) by mouth 2 (two) times daily as needed for refractory nausea / vomiting., Disp: 30 tablet, Rfl: 2 .  potassium chloride SA (K-DUR,KLOR-CON) 20 MEQ tablet, Take 1 tablet (20 mEq total) by mouth 2 (two) times daily., Disp: 30 tablet, Rfl: 1 .  prochlorperazine (COMPAZINE) 10 MG tablet, Take 1 tablet (10 mg total) by mouth every 6 (six) hours as needed (Nausea or vomiting)., Disp: 60 tablet, Rfl: 2 .  tetrahydrozoline 0.05 % ophthalmic solution, Place 2 drops into both eyes daily., Disp: , Rfl:  No current facility-administered medications for this visit.   Facility-Administered Medications Ordered in Other Visits:  .  dexamethasone  (DECADRON) injection 10 mg, 10 mg, Intravenous, Once, Finnegan, Timothy J, MD .  diphenoxylate-atropine (LOMOTIL) 2.5-0.025 MG per tablet 1 tablet, 1 tablet, Oral, Once, Allen, Lauren G, NP  Physical exam:  Vitals:   08/11/17 1057  BP: 131/89  Pulse: (!) 101  Resp: 20  Temp: (!) 97.2 F (36.2 C)  TempSrc: Tympanic  SpO2: 99%  Weight: (!) 334 lb (151.5 kg)   Physical Exam  Constitutional: She is oriented to person, place, and time. She appears well-developed and well-nourished. She appears ill. No distress.  Accompanied  HENT:  Head: Normocephalic and atraumatic.  Mucous membranes dry  Eyes: No scleral icterus.  Neck: Normal range of motion. Neck supple.  Cardiovascular: Normal rate and regular rhythm.  Pulmonary/Chest: Effort normal and breath sounds normal.  Abdominal: There is tenderness (generalized).  Rounded. Obese.  Musculoskeletal: Normal range of motion.  Neurological: She is alert and oriented to person, place, and time. Gait normal.  Skin: Skin is warm and dry.  Tenderness and swelling at right mastectomy site consistent. Not hot. No redness.   Psychiatric: She has a normal mood and affect.     CMP Latest Ref Rng & Units 08/11/2017  Glucose 65 - 99 mg/dL 122(H)  BUN 6 - 20 mg/dL 17  Creatinine 0.44 - 1.00 mg/dL 1.05(H)  Sodium 135 - 145 mmol/L 136  Potassium 3.5 - 5.1 mmol/L 3.1(L)  Chloride 101 - 111 mmol/L 98(L)  CO2 22 - 32 mmol/L 27  Calcium 8.9 - 10.3 mg/dL 8.9  Total Protein 6.5 - 8.1 g/dL 7.2  Total Bilirubin 0.3 - 1.2 mg/dL 0.7  Alkaline Phos 38 - 126 U/L 82  AST 15 - 41 U/L 32  ALT 14 - 54 U/L 36   CBC Latest Ref Rng & Units 08/11/2017  WBC 3.6 - 11.0 K/uL 1.1(LL)  Hemoglobin 12.0 - 16.0 g/dL 11.5(L)  Hematocrit 35.0 - 47.0 % 34.8(L)  Platelets 150 - 440 K/uL 246    No images are attached to the encounter.  No results found.   Assessment and plan- Patient is a 46 y.o. female with right breast cancer who presents to symptom management  clinic for diarrhea and nausea.  1.  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 Right total mastectomy with Dr. Cooper on 05/22/17, now on adjuvant Taxotere and Cystoxan s/p cycle 3. Has had seroma at   surgical site previously aspirated by Dr. Dillingham. Area tender and feels full. Advised patient to see Dr. Dillingham for further management and possible drainage.   2.  Intractable nausea, vomiting- refractory to oral zofran and compazine. Gave IV zofran in clinic. Patient continued to be nauseous and vomited. Compazine 5mg IV given in clinic.   3. Intractable diarrhea- refractory to Imodium and Lomotil. C diff and GI panel negative on 07/02/17. Suspect symptoms today d/t chemotherapy.   4.  Hypomagnesemia- Mg 1.4 today. Magnesium 2g IV given in clinic today.   5. Hypokalemia- K 3.1 today. Likely d/t poor oral intake and d/n/v. Potassium 10meq IV given in clinic. Symptom control and continue potassium supplementation.   6. Dehydration- Cr 1.05 (improved), BUN 17. IV fluids in clinic.   7. Malnutrition- referred to Joli Allen, dietician for weight loss and symptoms.   8. Neutropenia- d/t chemotherapy and possible malfunction of Neulasta onpro. ANC 500. Consulted w/ Dr. Finnegan who recommended holding off on Granix at this time as it is unclear if she received Neulasta d/t malfunction. Neutropenic precautions discussed.  Additionally, asked patient to notify clinic temperature 100.5 or greater and to monitor temperature at home regularly.   Case discussed with Dr. Finnegan. Given that patient unable to tolerate PO intake and continuing to have diarrhea, nausea, vomiting, and weakness feel she would benefit from continued care including IV fluids, electrolyte replacement, and control and nausea/vomiting/and diarrhea. Dr. Finnegan recommended admitting patient to hospital for  observation. Discussed patient with Dr. Mody who accepts for admission/observation. Upon discharge, patient to make appointment wiith Dr. Dillingham for re-evaluation of seroma.    Visit Diagnosis 1. Malignant neoplasm of upper-outer quadrant of right breast in female, estrogen receptor positive (HCC)   2. Intractable vomiting with nausea, unspecified vomiting type   3. Intractable diarrhea   4. Hypokalemia   5. Hypomagnesemia   6. Chemotherapy induced neutropenia (HCC)     Patient expressed understanding and was in agreement with this plan. She also understands that She can call clinic at any time with any questions, concerns, or complaints.   A total of (25) minutes of face-to-face time was spent with this patient with greater than 50% of that time in counseling and care-coordination.   Lauren Allen, DNP, AGNP-C [facility] [phone] (work cell) [phone] (office) 08/11/17 3:16 PM   

## 2017-08-12 DIAGNOSIS — C50411 Malignant neoplasm of upper-outer quadrant of right female breast: Secondary | ICD-10-CM | POA: Diagnosis not present

## 2017-08-12 DIAGNOSIS — R197 Diarrhea, unspecified: Secondary | ICD-10-CM

## 2017-08-12 DIAGNOSIS — D701 Agranulocytosis secondary to cancer chemotherapy: Secondary | ICD-10-CM | POA: Diagnosis not present

## 2017-08-12 DIAGNOSIS — Z170 Estrogen receptor positive status [ER+]: Secondary | ICD-10-CM | POA: Diagnosis not present

## 2017-08-12 LAB — GASTROINTESTINAL PANEL BY PCR, STOOL (REPLACES STOOL CULTURE)

## 2017-08-12 LAB — CBC
HCT: 32.3 % — ABNORMAL LOW (ref 35.0–47.0)
HEMOGLOBIN: 10.5 g/dL — AB (ref 12.0–16.0)
MCH: 26.1 pg (ref 26.0–34.0)
MCHC: 32.6 g/dL (ref 32.0–36.0)
MCV: 79.9 fL — AB (ref 80.0–100.0)
Platelets: 241 10*3/uL (ref 150–440)
RBC: 4.04 MIL/uL (ref 3.80–5.20)
RDW: 19.1 % — ABNORMAL HIGH (ref 11.5–14.5)
WBC: 0.8 10*3/uL — CL (ref 3.6–11.0)

## 2017-08-12 LAB — BASIC METABOLIC PANEL
ANION GAP: 5 (ref 5–15)
BUN: 14 mg/dL (ref 6–20)
CHLORIDE: 107 mmol/L (ref 101–111)
CO2: 21 mmol/L — AB (ref 22–32)
Calcium: 8 mg/dL — ABNORMAL LOW (ref 8.9–10.3)
Creatinine, Ser: 0.83 mg/dL (ref 0.44–1.00)
GFR calc Af Amer: >60 mL/min (ref >60)
GFR calc non Af Amer: >60 mL/min (ref >60)
Glucose, Bld: 143 mg/dL — ABNORMAL HIGH (ref 65–99)
Potassium: 4.7 mmol/L (ref 3.5–5.1)
Sodium: 133 mmol/L — ABNORMAL LOW (ref 135–145)

## 2017-08-12 LAB — MAGNESIUM: Magnesium: 1.8 mg/dL (ref 1.7–2.4)

## 2017-08-12 LAB — C DIFFICILE QUICK SCREEN W PCR REFLEX
C DIFFICILE (CDIFF) INTERP: NOT DETECTED
C DIFFICLE (CDIFF) ANTIGEN: NEGATIVE
C Diff toxin: NEGATIVE

## 2017-08-12 MED ORDER — ENOXAPARIN SODIUM 40 MG/0.4ML ~~LOC~~ SOLN
40.0000 mg | Freq: Two times a day (BID) | SUBCUTANEOUS | Status: DC
  Administered 2017-08-12: 10:00:00 40 mg via SUBCUTANEOUS
  Filled 2017-08-12: qty 0.4

## 2017-08-12 MED ORDER — MAGNESIUM SULFATE 2 GM/50ML IV SOLN
2.0000 g | Freq: Once | INTRAVENOUS | Status: AC
  Administered 2017-08-12: 12:00:00 2 g via INTRAVENOUS
  Filled 2017-08-12: qty 50

## 2017-08-12 MED ORDER — BOOST / RESOURCE BREEZE PO LIQD CUSTOM: 1.0000 | Freq: Three times a day (TID) | ORAL | Status: DC

## 2017-08-12 NOTE — Progress Notes (Addendum)
Entered in error

## 2017-08-12 NOTE — Discharge Summary (Signed)
Michelle Mcmillan, is a 47 y.o. female  DOB Mar 01, 1971  MRN [id number].  Admission date:  08/11/2017  Admitting Physician  Demetrios Loll, MD  Discharge Date:  08/12/2017   Primary MD  Donnie Coffin, MD  Recommendations for primary care physician for things to follow:   Follow-up with PCP in 1 week Follow-up with Dr. Grayland Ormond as scheduled for her chemotherapy.   Admission Diagnosis  Intractable nausea vomiting diarrhea    Discharge Diagnosis  Intractable nausea vomiting diarrhea     Active Problems:   Intractable nausea and vomiting      Past Medical History:  Diagnosis Date  . Anemia    H/O  . Arthritis    RIGHT KNEE  . Breast cancer ([place]) 2017   right breast  . Cancer ([place])    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)  . GERD (gastroesophageal reflux disease)    NO MEDS  . Headache    migraines  . Hypertension   . Last menstrual period (LMP) > 10 days ago 08/2015  . Personal history of radiation therapy     Past Surgical History:  Procedure Laterality Date  . BREAST BIOPSY Right 05/08/2017   invasive mamm. ca  . BREAST EXCISIONAL BIOPSY Right 06/15/15   breast cancer  . BREAST LUMPECTOMY WITH NEEDLE LOCALIZATION Right 06/15/2015   Procedure: BREAST LUMPECTOMY WITH NEEDLE LOCALIZATION;  Surgeon: Hubbard Robinson, MD;  Location: [facility] ORS;  Service: General;  Laterality: Right;  . BREAST SURGERY    . CESAREAN SECTION  1988   x1  . COLONOSCOPY WITH PROPOFOL N/A 01/23/2017   Procedure: COLONOSCOPY WITH PROPOFOL;  Surgeon: Lin Landsman, MD;  Location[facility] ENDOSCOPY;  Service: Gastroenterology;  Laterality: N/A;  . ESOPHAGOGASTRODUODENOSCOPY (EGD) WITH PROPOFOL N/A 01/23/2017   Procedure: ESOPHAGOGASTRODUODENOSCOPY (EGD) WITH PROPOFOL;  Surgeon: Lin Landsman, MD;  Location: [facility] ENDOSCOPY;  Service:  Gastroenterology;  Laterality: N/A;  . MASS EXCISION N/A 05/20/2016   Procedure: EXCISION CHEST WALL MASS;  Surgeon: Florene Glen, MD;  Location: [facility] ORS;  Service: General;  Laterality: N/A;  . SENTINEL NODE BIOPSY Right 06/15/2015   Procedure: SENTINEL NODE BIOPSY;  Surgeon: Hubbard Robinson, MD;  Location: [facility] ORS;  Service: General;  Laterality: Right;  . TOTAL MASTECTOMY Right 05/22/2017   Procedure: TOTAL MASTECTOMY;  Surgeon: Florene Glen, MD;  Location: [facility] ORS;  Service: General;  Laterality: Right;  . TUBAL LIGATION  2004       History of present illness and  Hospital Course:     Kindly see H&P for history of present illness and admission details, please review complete Labs, Consult reports and Test reports for all details in brief  HPI  from the history and physical done on the day of admission 47 year old female patient with history of breast cancer admitted for intractable nausea, vomiting, diarrhea that started after chemotherapy last Tuesday.  Patient admitted for the same.   Hospital Course   1 intractable nausea, vomiting, diarrhea likely acute symptoms likely secondary to recent chemotherapy.  Patient told me that whenever she gets chemotherapy she always gets nausea, vomiting, diarrhea.  Symptoms persisted so she came to hospital, admitted to hospitalist service, received IV hydration, IV potassium.  No further nausea or vomiting.  No diarrhea.  Patient is eager to go home.  She is tolerating the diet also. #2 adenocarcinoma of the right upper and outer quadrant of the breast, getting chemotherapy with Dr. Grayland Ormond, follow-up with them as scheduled on May 29  for next chemotherapy. 3.  Neutropenia due to chemotherapy, patient white count expect to slowly improve over the next several days as per Dr. Grayland Ormond. 4.  Hypomagnesemia replaced. Likely discharge home  use if stool C. difficile is negative. stoolGI panel has been negative. Discharge Condition:     Follow UP      Discharge Instructions  and  Discharge Medications      Allergies as of 08/12/2017      Reactions   Nitroglycerin Other (See Comments)   Headache, n/v.      Medication List    TAKE these medications   BLUE-EMU SUPER STRENGTH EX Apply 1 application topically daily as needed (for pain).   calcium-vitamin D 500-200 MG-UNIT tablet Take 1 tablet by mouth 2 (two) times daily.   diphenoxylate-atropine 2.5-0.025 MG tablet Commonly known as:  LOMOTIL Take 1 tablet by mouth 4 (four) times daily as needed for diarrhea or loose stools.   hydrochlorothiazide 25 MG tablet Commonly known as:  HYDRODIURIL Take 25 mg by mouth daily.   HYDROcodone-acetaminophen 5-325 MG tablet Commonly known as:  NORCO/VICODIN Take 1-2 tablets by mouth every 4 (four) hours as needed for moderate pain.   Iron 325 (65 Fe) MG Tabs Take 1 tablet (325 mg total) by mouth 2 (two) times daily.   omeprazole 20 MG capsule Commonly known as:  PRILOSEC Take 1 capsule (20 mg total) by mouth daily.   ondansetron 8 MG tablet Commonly known as:  ZOFRAN Take 1 tablet (8 mg total) by mouth 2 (two) times daily as needed for refractory nausea / vomiting.   potassium chloride SA 20 MEQ tablet Commonly known as:  K-DUR,KLOR-CON Take 1 tablet (20 mEq total) by mouth 2 (two) times daily.   prochlorperazine 10 MG tablet Commonly known as:  COMPAZINE Take 1 tablet (10 mg total) by mouth every 6 (six) hours as needed (Nausea or vomiting).   tetrahydrozoline 0.05 % ophthalmic solution Place 2 drops into both eyes daily.         Diet and Activity recommendation: See Discharge Instructions above   Consults obtained -ioncology   Major procedures and Radiology Reports - PLEASE review detailed and final reports for all details, in brief -      No results found.  Micro Results     Recent Results (from the past 240 hour(s))  Gastrointestinal Panel by PCR , Stool     Status: None    Collection Time: 08/12/17 11:17 AM  Result Value Ref Range Status   Campylobacter species NOT DETECTED NOT DETECTED Final   Plesimonas shigelloides NOT DETECTED NOT DETECTED Final   Salmonella species NOT DETECTED NOT DETECTED Final   Yersinia enterocolitica NOT DETECTED NOT DETECTED Final   Vibrio species NOT DETECTED NOT DETECTED Final   Vibrio cholerae NOT DETECTED NOT DETECTED Final   Enteroaggregative E coli (EAEC) NOT DETECTED NOT DETECTED Final   Enteropathogenic E coli (EPEC) NOT DETECTED NOT DETECTED Final   Enterotoxigenic E coli (ETEC) NOT DETECTED NOT DETECTED Final   Shiga like toxin producing E coli (STEC) NOT DETECTED NOT DETECTED Final   Shigella/Enteroinvasive E coli (EIEC) NOT DETECTED NOT DETECTED Final   Cryptosporidium NOT DETECTED NOT DETECTED Final   Cyclospora cayetanensis NOT DETECTED NOT DETECTED Final   Entamoeba histolytica NOT DETECTED NOT DETECTED Final   Giardia lamblia NOT DETECTED NOT DETECTED Final   Adenovirus F40/41 NOT DETECTED NOT DETECTED Final   Astrovirus NOT DETECTED NOT DETECTED Final   Norovirus GI/GII NOT DETECTED  NOT DETECTED Final   Rotavirus A NOT DETECTED NOT DETECTED Final   Sapovirus (I, II, IV, and V) NOT DETECTED NOT DETECTED Final    Comment: Performed at [facility], [address], [place], [place] [address]       Today   Subjective:   [name] today has no headache,no chest abdominal pain,no new weakness tingling or numbness, feels much better wants to go home today.   Objective:   Blood pressure 120/83, pulse 79, temperature 98.1 F (36.7 C), temperature source Oral, resp. rate 17, height 5\' 9"  (1.753 m), weight (!) 152.1 kg (335 lb 4 oz), SpO2 100 %.   Intake/Output Summary (Last 24 hours) at 08/12/2017 1445 Last data filed at 08/12/2017 1315 Gross per 24 hour  Intake 1113.33 ml  Output 200 ml  Net 913.33 ml    Exam Awake Alert, Oriented x 3, No new F.N deficits, Normal  affect [place].AT,PERRAL Supple Neck,No JVD, No cervical lymphadenopathy appriciated.  Symmetrical Chest wall movement, Good air movement bilaterally, CTAB RRR,No Gallops,Rubs or new Murmurs, No Parasternal Heave +ve B.Sounds, Abd Soft, Non tender, No organomegaly appriciated, No rebound -guarding or rigidity. No Cyanosis, Clubbing or edema, No new Rash or bruise  Data Review   CBC w Diff:  Lab Results  Component Value Date   WBC 0.8 (LL) 08/12/2017   HGB 10.5 (L) 08/12/2017   HCT 32.3 (L) 08/12/2017   PLT 241 08/12/2017   LYMPHOPCT 44 08/11/2017   MONOPCT 5 08/11/2017   EOSPCT 1 08/11/2017   BASOPCT 2 08/11/2017    CMP:  Lab Results  Component Value Date   NA 133 (L) 08/12/2017   K 4.7 08/12/2017   CL 107 08/12/2017   CO2 21 (L) 08/12/2017   BUN 14 08/12/2017   CREATININE 0.83 08/12/2017   PROT 7.2 08/11/2017   ALBUMIN 3.3 (L) 08/11/2017   BILITOT 0.7 08/11/2017   ALKPHOS 82 08/11/2017   AST 32 08/11/2017   ALT 36 08/11/2017  .   Total Time in preparing paper work, data evaluation and todays exam - 35 minutes  Epifanio Lesches M.D on 08/12/2017 at 2:45 PM    Note: This dictation was prepared with Dragon dictation along with smaller phrase technology. Any transcriptional errors that result from this process are unintentional.

## 2017-08-12 NOTE — Consult Note (Signed)
[place]  [phone] [phone]  ID: Michelle Mcmillan OB: 1970-05-20  MR#: [id number]  TKW#:[id number]  Patient Care Team: Donnie Coffin, MD as PCP - General (Family Medicine) Rico Junker, RN as Registered Nurse Theodore Demark, RN as Registered Nurse Florene Glen, MD (Surgery)  CHIEF COMPLAINT: Breast cancer, intractable nausea and vomiting.  Diarrhea.  INTERVAL HISTORY: Patient is a 47 year old female who last received chemotherapy with Taxotere and Cytoxan on Aug 05, 2017.  She was initially evaluated in clinic yesterday with intractable nausea vomiting and significant diarrhea.  Patient was admitted for observation.  She currently feels significantly improved over yesterday.  Her nausea and vomiting have resolved.  She did have several bouts of diarrhea today, but this is significantly improved.  She has no neurologic complaints.  She denies any fevers.  She has a fair appetite.  She has no chest pain or shortness of breath.  She has no urinary complaints.  Patient offers no further specific complaints.  REVIEW OF SYSTEMS:   Review of Systems  Constitutional: Negative.  Negative for fever, malaise/fatigue and weight loss.  Respiratory: Negative.  Negative for cough and shortness of breath.   Cardiovascular: Negative.  Negative for chest pain and leg swelling.  Gastrointestinal: Positive for diarrhea. Negative for abdominal pain and nausea.  Genitourinary: Negative.  Negative for dysuria.  Musculoskeletal: Negative.   Skin: Negative.  Negative for rash.  Neurological: Negative.  Negative for sensory change, focal weakness and weakness.  Psychiatric/Behavioral: Negative.  The patient is not nervous/anxious.     As per HPI. Otherwise, a complete review of systems is negative.  PAST MEDICAL HISTORY: Past Medical History:  Diagnosis Date  . Anemia    H/O  . Arthritis    RIGHT KNEE  . Breast cancer ([place]) 2017   right breast  .  Cancer ([place])    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)  . GERD (gastroesophageal reflux disease)    NO MEDS  . Headache    migraines  . Hypertension   . Last menstrual period (LMP) > 10 days ago 08/2015  . Personal history of radiation therapy     PAST SURGICAL HISTORY: Past Surgical History:  Procedure Laterality Date  . BREAST BIOPSY Right 05/08/2017   invasive mamm. ca  . BREAST EXCISIONAL BIOPSY Right 06/15/15   breast cancer  . BREAST LUMPECTOMY WITH NEEDLE LOCALIZATION Right 06/15/2015   Procedure: BREAST LUMPECTOMY WITH NEEDLE LOCALIZATION;  Surgeon: Hubbard Robinson, MD;  Location: [facility] ORS;  Service: General;  Laterality: Right;  . BREAST SURGERY    . CESAREAN SECTION  1988   x1  . COLONOSCOPY WITH PROPOFOL N/A 01/23/2017   Procedure: COLONOSCOPY WITH PROPOFOL;  Surgeon: Lin Landsman, MD;  Location: [facility] ENDOSCOPY;  Service: Gastroenterology;  Laterality: N/A;  . ESOPHAGOGASTRODUODENOSCOPY (EGD) WITH PROPOFOL N/A 01/23/2017   Procedure: ESOPHAGOGASTRODUODENOSCOPY (EGD) WITH PROPOFOL;  Surgeon: Lin Landsman, MD;  Location: [facility] ENDOSCOPY;  Service: Gastroenterology;  Laterality: N/A;  . MASS EXCISION N/A 05/20/2016   Procedure: EXCISION CHEST WALL MASS;  Surgeon: Florene Glen, MD;  Location: [facility] ORS;  Service: General;  Laterality: N/A;  . SENTINEL NODE BIOPSY Right 06/15/2015   Procedure: SENTINEL NODE BIOPSY;  Surgeon: Hubbard Robinson, MD;  Location: [facility] ORS;  Service: General;  Laterality: Right;  . TOTAL MASTECTOMY Right 05/22/2017   Procedure: TOTAL MASTECTOMY;  Surgeon: Florene Glen, MD;  Location: [facility] ORS;  Service: General;  Laterality: Right;  . TUBAL  LIGATION  2004    FAMILY HISTORY: Family History  Problem Relation Age of Onset  . Hypertension Mother   . Hypertension Father   . Hypertension Sister   . Breast cancer Neg Hx     ADVANCED DIRECTIVES (Y/N):  _0 @  HEALTH MAINTENANCE: Social History   Tobacco Use  . Smoking status: Never  Smoker  . Smokeless tobacco: Never Used  Substance Use Topics  . Alcohol use: Yes    Comment: WINE ONCE MONTH  . Drug use: No     Colonoscopy:  PAP:  Bone density:  Lipid panel:  Allergies  Allergen Reactions  . Nitroglycerin Other (See Comments)    Headache, n/v.    Current Facility-Administered Medications  Medication Dose Route Frequency Provider Last Rate Last Dose  . 0.9 % NaCl with KCl 40 mEq / L  infusion   Intravenous Continuous Demetrios Loll, MD 100 mL/hr at 08/12/17 0255 100 mL/hr at 08/12/17 0255  . acetaminophen (TYLENOL) tablet 650 mg  650 mg Oral Q6H PRN Demetrios Loll, MD       Or  . acetaminophen (TYLENOL) suppository 650 mg  650 mg Rectal Q6H PRN Demetrios Loll, MD      . albuterol (PROVENTIL) (2.5 MG/3ML) 0.083% nebulizer solution 2.5 mg  2.5 mg Nebulization Q2H PRN Demetrios Loll, MD      . diphenoxylate-atropine (LOMOTIL) 2.5-0.025 MG per tablet 1 tablet  1 tablet Oral QID PRN Demetrios Loll, MD      . enoxaparin (LOVENOX) injection 40 mg  40 mg Subcutaneous Q12H Epifanio Lesches, MD   40 mg at 08/12/17 1002  . HYDROcodone-acetaminophen (NORCO/VICODIN) 5-325 MG per tablet 1-2 tablet  1-2 tablet Oral Q4H PRN Demetrios Loll, MD      . naphazoline-pheniramine (NAPHCON-A) 0.025-0.3 % ophthalmic solution 1 drop  1 drop Both Eyes Daily Epifanio Lesches, MD   1 drop at 08/12/17 1002  . ondansetron (ZOFRAN) tablet 4 mg  4 mg Oral Q6H PRN Demetrios Loll, MD       Or  . ondansetron [facility]) injection 4 mg  4 mg Intravenous Q6H PRN Demetrios Loll, MD      . pantoprazole (PROTONIX) EC tablet 40 mg  40 mg Oral Daily Demetrios Loll, MD   40 mg at 08/12/17 1001  . potassium chloride SA (K-DUR,KLOR-CON) CR tablet 20 mEq  20 mEq Oral BID Demetrios Loll, MD   20 mEq at 08/12/17 1001  . prochlorperazine (COMPAZINE) tablet 10 mg  10 mg Oral Q6H PRN Demetrios Loll, MD        OBJECTIVE: Vitals:   08/12/17 0428 08/12/17 1018  BP: 91/62 120/83  Pulse: 77 79  Resp: 17   Temp: 98.2 F (36.8 C) 98.1 F (36.7 C)    SpO2: 95% 100%     Body mass index is 49.51 kg/m.    ECOG FS:1 - Symptomatic but completely ambulatory  General: Well-developed, well-nourished, no acute distress. Eyes: Pink conjunctiva, anicteric sclera. HEENT: Normocephalic, moist mucous membranes, clear oropharnyx. Lungs: Clear to auscultation bilaterally. Heart: Regular rate and rhythm. No rubs, murmurs, or gallops. Abdomen: Soft, nontender, nondistended. No organomegaly noted, normoactive bowel sounds. Musculoskeletal: No edema, cyanosis, or clubbing. Neuro: Alert, answering all questions appropriately. Cranial nerves grossly intact. Skin: No rashes or petechiae noted. Psych: Normal affect. Lymphatics: No cervical, calvicular, axillary or inguinal LAD.   LAB RESULTS:  Lab Results  Component Value Date   NA 133 (L) 08/12/2017   K 4.7 08/12/2017   CL 107 08/12/2017  CO2 21 (L) 08/12/2017   GLUCOSE 143 (H) 08/12/2017   BUN 14 08/12/2017   CREATININE 0.83 08/12/2017   CALCIUM 8.0 (L) 08/12/2017   PROT 7.2 08/11/2017   ALBUMIN 3.3 (L) 08/11/2017   AST 32 08/11/2017   ALT 36 08/11/2017   ALKPHOS 82 08/11/2017   BILITOT 0.7 08/11/2017   GFRNONAA >60 08/12/2017   GFRAA >60 08/12/2017    Lab Results  Component Value Date   WBC 0.8 (LL) 08/12/2017   NEUTROABS 0.5 (L) 08/11/2017   HGB 10.5 (L) 08/12/2017   HCT 32.3 (L) 08/12/2017   MCV 79.9 (L) 08/12/2017   PLT 241 08/12/2017     STUDIES: No results found.  ASSESSMENT: Breast cancer, intractable nausea and vomiting.  Diarrhea.  PLAN:    1. 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  Given her difficulty with Taxotere, her regimen will likely have to be changed.  Patient has been instructed to keep her previously scheduled follow-up on Aug 27, 2017 for further evaluation and her next scheduled chemotherapy. 2.   Diarrhea: Improving.  Infectious work-up negative today. 3.  Nausea vomiting: Resolved. 4.  Neutropenia: Secondary to chemotherapy.  Patient is now on her nadir which is 7 to 10 days after treatment, expect white blood cell count to slowly improve over the next several days.  No need for Neupogen or Granix. 5.  Hypomagnesia: Improved. 6.  Disposition: Okay to discharge today from an oncology standpoint.  Will arrange follow-up in the cancer center later this week for lab check.  Appreciate consult, call with questions.  Cancer Staging Malignant neoplasm of upper-outer quadrant of right female breast [facility]) Staging form: Breast, AJCC 7th Edition - Pathologic stage from 07/13/2015: Stage IA (T1c, N0, cM0) - Signed by Lloyd Huger, MD on 07/13/2015   Lloyd Huger, MD   08/12/2017 1:53 PM

## 2017-08-12 NOTE — Progress Notes (Signed)
Patient discharged home per MD order. All discharge instructions given and all questions answered. 

## 2017-08-12 NOTE — Progress Notes (Signed)
Initial Nutrition Assessment  DOCUMENTATION CODES:   Morbid obesity  INTERVENTION:  Provide Boost Breeze po TID, each supplement provides 250 kcal and 9 grams of protein.  NUTRITION DIAGNOSIS:   Increased nutrient needs related to catabolic illness, cancer and cancer related treatments as evidenced by estimated needs.  GOAL:   Patient will meet greater than or equal to 90% of their needs  MONITOR:   PO intake, Supplement acceptance, Labs, Weight trends, I & O's  REASON FOR ASSESSMENT:   Malnutrition Screening Tool    ASSESSMENT:   47 year old female with PMHx of HTN, GERD, arthritis, anemia, breast cancer s/p right mastectomy 05/22/2017 and currently on chemotherapy who was admitted with intractable N/V and diarrhea.   Met with patient at bedside. She reports her appetite is much improved today. She reports that starting on 5/9 she had N/V and diarrhea. Her N/V is improved today. She reports 2 episodes of diarrhea this AM. She was able to eat breakfast and lunch today and tolerated well. She did not get to try any of the supplement samples she got from the outpatient RD at cancer center since she was directly admitted here. She would like to try some Boost Breeze.   Weights appear to fluctuate. She was 345.2 lbs on 06/02/2017, 341.1 lbs on 07/15/2017, 354.1 lbs on 08/05/2017, and is currently 335.3 lbs.  Meal Completion: 100%  Medications reviewed and include: pantoprazole, NS with KCl 40 mEq/L at 100 mL/hr.  Labs reviewed: Sodium 133, CO2 21.  Patient does not meet criteria for malnutrition at this time.  NUTRITION - FOCUSED PHYSICAL EXAM:    Most Recent Value  Orbital Region  No depletion  Upper Arm Region  No depletion  Thoracic and Lumbar Region  No depletion  Buccal Region  No depletion  Temple Region  No depletion  Clavicle Bone Region  No depletion  Clavicle and Acromion Bone Region  No depletion  Scapular Bone Region  No depletion  Dorsal Hand  No depletion   Patellar Region  No depletion  Anterior Thigh Region  No depletion  Posterior Calf Region  No depletion  Edema (RD Assessment)  None  Hair  Reviewed  Eyes  Reviewed  Mouth  Reviewed  Skin  Reviewed  Nails  Reviewed     Diet Order:   Diet Order           Diet regular Room service appropriate? Yes; Fluid consistency: Thin  Diet effective now          EDUCATION NEEDS:   No education needs have been identified at this time  Skin:  Skin Assessment: Reviewed RN Assessment  Last BM:  08/12/2017 - no BM characteristics documented  Height:   Ht Readings from Last 1 Encounters:  08/11/17 5' 9"  (1.753 m)    Weight:   Wt Readings from Last 1 Encounters:  08/11/17 (!) 335 lb 4 oz (152.1 kg)    Ideal Body Weight:  65.9 kg  BMI:  Body mass index is 49.51 kg/m.  Estimated Nutritional Needs:   Kcal:  2400-2600 (MSJ x 1.1-1.2)  Protein:  >120 grams  Fluid:  2-2.3 L/day (30-35 mL/kg IBW)  Willey Blade, MS, RD, LDN Office: [phone] Pager: [phone] After Hours/Weekend Pager: [phone]

## 2017-08-12 NOTE — Progress Notes (Signed)
Pharmacist to MD Communication  With standing orders for LMWH prophylaxis in obese patients (BMI > 40kg/m2) with good renal function (CrCl>62ml/min) pharmacy will transition to Lovenox 40mg  SQ BID

## 2017-08-15 ENCOUNTER — Inpatient Hospital Stay: Payer: Medicaid Other

## 2017-08-15 DIAGNOSIS — Z5111 Encounter for antineoplastic chemotherapy: Secondary | ICD-10-CM | POA: Diagnosis not present

## 2017-08-15 DIAGNOSIS — Z170 Estrogen receptor positive status [ER+]: Principal | ICD-10-CM

## 2017-08-15 DIAGNOSIS — C50411 Malignant neoplasm of upper-outer quadrant of right female breast: Secondary | ICD-10-CM

## 2017-08-15 LAB — PATHOLOGIST SMEAR REVIEW

## 2017-08-15 LAB — COMPREHENSIVE METABOLIC PANEL
ALBUMIN: 3.2 g/dL — AB (ref 3.5–5.0)
ALT: 38 U/L (ref 14–54)
AST: 31 U/L (ref 15–41)
Alkaline Phosphatase: 87 U/L (ref 38–126)
Anion gap: 9 (ref 5–15)
BILIRUBIN TOTAL: 0.6 mg/dL (ref 0.3–1.2)
BUN: 13 mg/dL (ref 6–20)
CALCIUM: 8.7 mg/dL — AB (ref 8.9–10.3)
CO2: 26 mmol/L (ref 22–32)
CREATININE: 1.04 mg/dL — AB (ref 0.44–1.00)
Chloride: 99 mmol/L — ABNORMAL LOW (ref 101–111)
GFR calc Af Amer: >60 mL/min (ref >60)
Glucose, Bld: 129 mg/dL — ABNORMAL HIGH (ref 65–99)
Potassium: 3.2 mmol/L — ABNORMAL LOW (ref 3.5–5.1)
Sodium: 134 mmol/L — ABNORMAL LOW (ref 135–145)
TOTAL PROTEIN: 7.1 g/dL (ref 6.5–8.1)

## 2017-08-15 LAB — CBC WITH DIFFERENTIAL/PLATELET
Band Neutrophils: 3 %
Basophils Absolute: 0 10*3/uL (ref 0–0.1)
Basophils Relative: 0 %
Eosinophils Absolute: 0 10*3/uL (ref 0–0.7)
Eosinophils Relative: 0 %
HCT: 32.5 % — ABNORMAL LOW (ref 35.0–47.0)
Hemoglobin: 10.7 g/dL — ABNORMAL LOW (ref 12.0–16.0)
Lymphocytes Relative: 47 %
Lymphs Abs: 0.9 10*3/uL — ABNORMAL LOW (ref 1.0–3.6)
MCH: 25.7 pg — AB (ref 26.0–34.0)
MCHC: 32.8 g/dL (ref 32.0–36.0)
MCV: 78.3 fL — ABNORMAL LOW (ref 80.0–100.0)
MONO ABS: 0.2 10*3/uL (ref 0.2–0.9)
Metamyelocytes Relative: 1 %
Monocytes Relative: 10 %
NEUTROS ABS: 0.9 10*3/uL — AB (ref 1.4–6.5)
NEUTROS PCT: 39 %
NRBC: 5 /100{WBCs} — AB
PLATELETS: 295 10*3/uL (ref 150–440)
RBC: 4.15 MIL/uL (ref 3.80–5.20)
RDW: 19.3 % — AB (ref 11.5–14.5)
WBC: 2 10*3/uL — AB (ref 4.0–10.5)

## 2017-08-18 ENCOUNTER — Ambulatory Visit: Payer: Medicaid Other | Attending: Oncology | Admitting: Occupational Therapy

## 2017-08-18 DIAGNOSIS — I972 Postmastectomy lymphedema syndrome: Secondary | ICD-10-CM | POA: Diagnosis present

## 2017-08-18 DIAGNOSIS — M6281 Muscle weakness (generalized): Secondary | ICD-10-CM | POA: Diagnosis present

## 2017-08-18 DIAGNOSIS — M25511 Pain in right shoulder: Secondary | ICD-10-CM | POA: Insufficient documentation

## 2017-08-18 NOTE — Patient Instructions (Signed)
Same HEP for shoulder AROM , position and  Ice  Add Green theraband for scapula retraction and shoulder extention - if she gets her compression garments  1-2 x day for 15 reps     manual lymph drainage once each day to help decrease swelling.  This should take you about 30 minutes depending on the size of your limb.  For Right Arm:  Michelle Mcmillan yourself at the base of your neck and do 8 small circles, and 2 fingers behind clavicle 8 x  . Do 8 semicircles at left armpit and right groin . Pump across chest from right to left 8 times . Pump down the right side of trunk from armpit to groin 8 times . Pump up the outside of right upper arm 8 times, inside of upper arm to outside 8x, outside of upper arm again 8x  . Pump across chest from R to L 8 times . Pump  down the right side of trunk from armpit to groin 8 times . Pump top of forearm from wrist to elbow 8 times . Pump up the outside of right upper arm 8 times . Pump across chest from right to left 8 times . Pump down the right side of trunk from armpit to groin 8 times . Pump up the back of the forearm from wrist to elbow 8 times . Pump up the outside of right upper arm 8 times . Pump across chest from right to left 8 times . Pump down the right side of trunk from armpit to groin 8 times . Do 8 semicircles at left armpit and right groin 8 times . Repeat nr.1  SLOW and LIGHT with only your palm NOT FINGERTIPS If help at home can replace or do also massage over upper back from right to left , when doing chest from right to left.

## 2017-08-18 NOTE — Therapy (Signed)
[place] [facility] [address] [place], [address] Phone: [phone]   Fax:  [phone]  Occupational Therapy Treatment  Patient Details  Name: Michelle Mcmillan MRN: [id number] Date of Birth: April 07, 1970 Referring Provider: Grayland Ormond   Encounter Date: 08/18/2017  OT End of Session - 08/18/17 1707    Visit Number  2    Number of Visits  12    Date for OT Re-Evaluation  09/03/17    OT Start Time  1000    OT Stop Time  1050    OT Time Calculation (min)  50 min    Activity Tolerance  Patient tolerated treatment well    Behavior During Therapy  [facility] for tasks assessed/performed       Past Medical History:  Diagnosis Date  . Anemia    H/O  . Arthritis    RIGHT KNEE  . Breast cancer ([place]) 2017   right breast  . Cancer ([place])    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)  . GERD (gastroesophageal reflux disease)    NO MEDS  . Headache    migraines  . Hypertension   . Last menstrual period (LMP) > 10 days ago 08/2015  . Personal history of radiation therapy     Past Surgical History:  Procedure Laterality Date  . BREAST BIOPSY Right 05/08/2017   invasive mamm. ca  . BREAST EXCISIONAL BIOPSY Right 06/15/15   breast cancer  . BREAST LUMPECTOMY WITH NEEDLE LOCALIZATION Right 06/15/2015   Procedure: BREAST LUMPECTOMY WITH NEEDLE LOCALIZATION;  Surgeon: Hubbard Robinson, MD;  Location: [facility] ORS;  Service: General;  Laterality: Right;  . BREAST SURGERY    . CESAREAN SECTION  1988   x1  . COLONOSCOPY WITH PROPOFOL N/A 01/23/2017   Procedure: COLONOSCOPY WITH PROPOFOL;  Surgeon: Lin Landsman, MD;  Location: [facility] ENDOSCOPY;  Service: Gastroenterology;  Laterality: N/A;  . ESOPHAGOGASTRODUODENOSCOPY (EGD) WITH PROPOFOL N/A 01/23/2017   Procedure: ESOPHAGOGASTRODUODENOSCOPY (EGD) WITH PROPOFOL;  Surgeon: Lin Landsman, MD;  Location: [facility] ENDOSCOPY;  Service: Gastroenterology;  Laterality: N/A;  . MASS EXCISION N/A  05/20/2016   Procedure: EXCISION CHEST WALL MASS;  Surgeon: Florene Glen, MD;  Location: [facility] ORS;  Service: General;  Laterality: N/A;  . SENTINEL NODE BIOPSY Right 06/15/2015   Procedure: SENTINEL NODE BIOPSY;  Surgeon: Hubbard Robinson, MD;  Location: [facility] ORS;  Service: General;  Laterality: Right;  . TOTAL MASTECTOMY Right 05/22/2017   Procedure: TOTAL MASTECTOMY;  Surgeon: Florene Glen, MD;  Location: [facility] ORS;  Service: General;  Laterality: Right;  . TUBAL LIGATION  2004    There were no vitals filed for this visit.  Subjective Assessment - 08/18/17 1701    Subjective   [name] , I had rough time since I seen you last - the chemo made me so sick - ended up in the ER for fluid - and white blood count was so low - Dr Grayland Ormond is going to change my chemo drug and then I did see the surgeon - but  going to hold off until my blood count is up - I think I am going to wait until Aug - I did not hear back from [facility] about my sleeve and breast pad - I phoned them every week     Patient Stated Goals  I want to be able to use my R arm  and get the pain better and  keep lymphedema under control - 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  No/denies         [facility] OT Assessment - 08/18/17 0001      AROM   Right Shoulder Extension  60 Degrees    Right Shoulder Flexion  145 Degrees    Right Shoulder ABduction  145 Degrees    Left Shoulder Extension  60 Degrees    Left Shoulder Flexion  160 Degrees    Left Shoulder ABduction  155 Degrees      LYMPHEDEMA/ONCOLOGY QUESTIONNAIRE - 08/18/17 1004      Right Upper Extremity Lymphedema   15 cm Proximal to Olecranon Process  48 cm    10 cm Proximal to Olecranon Process  45 cm    Olecranon Process  35 cm    15 cm Proximal to Ulnar Styloid Process  32.1 cm    10 cm Proximal to Ulnar Styloid Process  28.4 cm    Just Proximal to Ulnar Styloid Process  21.5 cm    Across Hand at [employer]  22 cm    At [place] of 2nd Digit  6.4 cm    At [facility] of Thumb  6.6 cm        Measurement taken for R UE - and was increase some what - did not get her compression garments since seen 3 wks ago - contacted [facility] - pt to come over there when leaving OT today - they could not find her order or measurements   AROM measured great progress - see flowsheet     Same HEP for shoulder AROM , position and  Ice  Add Green theraband for scapula retraction and shoulder extention - if she gets her compression garments  1-2 x day for 15 reps    ed pt and done in clinic  manual lymph drainage once each day to help decrease swelling.  This should take you about 30 minutes depending on the size of your limb.  For Right Arm:  Felicie Morn yourself at the base of your neck and do 8 small circles, and 2 fingers behind clavicle 8 x  . Do 8 semicircles at left armpit and right groin . Pump across chest from right to left 8 times . Pump down the right side of trunk from armpit to groin 8 times . Pump up the outside of right upper arm 8 times, inside of upper arm to outside 8x, outside of upper arm again 8x  . Pump across chest from R to L 8 times . Pump  down the right side of trunk from armpit to groin 8 times . Pump top of forearm from wrist to elbow 8 times . Pump up the outside of right upper arm 8 times . Pump across chest from right to left 8 times . Pump down the right side of trunk from armpit to groin 8 times . Pump up the back of the forearm from wrist to elbow 8 times . Pump up the outside of right upper arm 8 times . Pump across chest from right to left 8 times . Pump down the right side of trunk from armpit to groin 8 times . Do 8 semicircles at left armpit and right groin 8 times . Repeat nr.1  SLOW and LIGHT with only your palm NOT FINGERTIPS If help at home can replace or do also massage over upper back from right to  left , when doing chest from right to left.       pt tender  over anterior shoulder - done   OT Treatments/Exercises (OP) - 08/18/17 0001      Ultrasound   Ultrasound Location  anterior shoulder    Ultrasound Parameters  3.3MHZ, 0.8 intensity , 20% - 5 min     Ultrasound Goals  Pain             OT Education - 08/18/17 1706    Education provided  Yes    Education Details  compression garments , HEP for ROM , GTB , ice and self MLD     Person(s) Educated  Patient    Methods  Explanation;Demonstration;Handout    Comprehension  Returned demonstration;Verbalized understanding       OT Short Term Goals - 07/23/17 2011      OT SHORT TERM GOAL #1   Title  Pain in R shoulder and under arm  decrease to less than 3/10 at the worse to sleep better     Baseline  pain can increase to 6-7/10 at the worse and best 3/10     Time  3    Period  Weeks    Status  New    Target Date  08/13/17      OT SHORT TERM GOAL #2   Title  Pt to be independent in HEP to wear compression sleeve and glove to maintain circumference in R UE     Baseline  no compression and pt report arm increase in swelling towards the end of day - and sleeves is tight and cannot get jewelry on     Time  2    Period  Weeks    Status  New    Target Date  08/06/17        OT Long Term Goals - 07/23/17 2111      OT LONG TERM GOAL #1   Title  R shoulder AROM improve for pt to reach over head , and donn/doff  sweater over head without increase  symptoms     Baseline  R shoulder flexion 120 , ABD 85 and ext 45     Time  4    Period  Weeks    Status  New    Target Date  08/20/17      OT LONG TERM GOAL #2   Title  R shoulder strength increase for pt do house work , carry more than 5 lbs without increase symptoms     Baseline  pain with AROM to 85 ABD ,shoulder flexion 120 - cannot tolerate resistance and tender over bicep and supraspinatus - as well as upper traps     Time  6    Period  Weeks    Status  New    Target Date  09/03/17            Plan - 08/18/17 1707     Clinical Impression Statement  Pt showed great progress in R shoulder AROM in all planes and decrease pain with ROM - but still tender over anterior shoulder - scar still adhere and tender  and pocket of lymphedema on medial scar on chest - pt in need for compression garments - contacted [facility] - could not find records that it was ordered  - pt to go over there when leaving OT to get remeasured  - pt to follow up with me next week     Occupational performance deficits (  Please refer to evaluation for details):  ADL's;IADL's    Rehab Potential  Good    OT Frequency  1x / week    OT Duration  4 weeks    OT Treatment/Interventions  Self-care/ADL training;Therapeutic exercise;Manual lymph drainage;Manual Therapy;Passive range of motion;Scar mobilization;Patient/family education    Plan  assess progress with HEP  and if got her garments  - use of it and shoulder pain ?     Clinical Decision Making  Several treatment options, min-mod task modification necessary    OT Home Exercise Plan  see pt instruction     Consulted and Agree with Plan of Care  Patient       Patient will benefit from skilled therapeutic intervention in order to improve the following deficits and impairments:  Pain, Increased edema, Impaired flexibility, Decreased scar mobility, Decreased strength, Decreased range of motion, Impaired UE functional use  Visit Diagnosis: Postmastectomy lymphedema syndrome  Acute pain of right shoulder  Muscle weakness (generalized)    Problem List Patient Active Problem List   Diagnosis Date Noted  . Intractable nausea and vomiting 08/11/2017  . Breast cancer ([place]) 05/22/2017  . Morbid (severe) obesity due to excess calories ([place]) 04/15/2017  . Microcytic anemia 04/15/2017  . GERD (gastroesophageal reflux disease) 04/15/2017  . Chest wall mass   . Malignant neoplasm of upper-outer quadrant of right female breast ([place]) 05/25/2015  . HTN (hypertension) 05/24/2015    Rosalyn Gess OTR/L,CLT 08/18/2017, 5:15 PM  [place] [facility] [address] [place], [address] Phone: [phone]   Fax:  [phone]  Name: Michelle Mcmillan MRN: [id number] Date of Birth: 1970/12/14

## 2017-08-21 ENCOUNTER — Telehealth: Payer: Self-pay

## 2017-08-21 NOTE — Telephone Encounter (Signed)
Nutrition Follow-up:  Patient with breast cancer.  Patient current receiving chemotherapy.  Chart reviewed and noted hospital admission following symptom management clinic evaluation.    Called patient and spoke with her via phone this pm.  Patient reports that she is feeling much better. No nausea, vomiting or diarrhea.  Reports that she is eating better and drinking ensure clear shakes or boost shake.     Anthropometrics:   Weight reviewed and noted stable weight.   NUTRITION DIAGNOSIS: Inadequate oral intake improved   INTERVENTION:  Encouraged patient to continue to focus on good nutrition especially good sources of protein.  Encouraged use or oral nutrition supplements for added nutrition Encouraged patient to reach out to RD with further issues with nutrition if needed.    MONITORING, EVALUATION, GOAL: weight trends, intake   NEXT VISIT: as needed, pt to contact  Kishaun Erekson B. [facility], [place] Registered Dietitian [phone] (pager)

## 2017-08-25 NOTE — Progress Notes (Signed)
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   INTERVAL HISTORY: Patient returns to clinic today for further evaluation and consideration of cycle 4 of Taxotere and Cytoxan.  She continues to be anxious, but otherwise feels well. She has no neurologic complaints. She denies any recent fevers or illnesses. She has a good appetite and denies weight loss. She has no chest pain or shortness of breath.  She denies any nausea, vomiting, constipation, or diarrhea.  She has no urinary complaints.  Patient offers no specific complaints today.  REVIEW OF SYSTEMS:   Review of Systems  Constitutional: Negative.  Negative for fever, malaise/fatigue and weight loss.  Respiratory: Negative.  Negative for cough and shortness of breath.   Cardiovascular: Negative.  Negative for chest pain and leg swelling.  Gastrointestinal: Negative for abdominal pain, constipation, diarrhea, nausea and vomiting.  Genitourinary: Negative.  Negative for dysuria.  Musculoskeletal: Negative.  Negative for myalgias.  Skin: Negative.  Negative for rash.  Neurological: Negative.  Negative for sensory change, focal weakness and weakness.  Psychiatric/Behavioral: The patient is nervous/anxious.     As per HPI. Otherwise, a complete review of systems is negative.  PAST MEDICAL HISTORY: Past Medical History:  Diagnosis Date  . Anemia    H/O  . Arthritis    RIGHT KNEE  . Breast cancer ([place]) 2017   right breast  . Cancer ([place])    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)  . GERD (gastroesophageal reflux disease)    NO MEDS    . Headache    migraines  . Hypertension   . Last menstrual period (LMP) > 10 days ago 08/2015  . Personal history of radiation therapy     PAST SURGICAL HISTORY: Past Surgical History:  Procedure Laterality Date  . BREAST BIOPSY Right 05/08/2017   invasive mamm. ca  . BREAST EXCISIONAL BIOPSY Right 06/15/15   breast cancer  . BREAST LUMPECTOMY WITH NEEDLE LOCALIZATION Right 06/15/2015   Procedure: BREAST LUMPECTOMY WITH NEEDLE LOCALIZATION;  Surgeon: Hubbard Robinson, MD;  Location: [facility] ORS;  Service: General;  Laterality: Right;  . BREAST SURGERY    . CESAREAN SECTION  1988   x1  . COLONOSCOPY WITH PROPOFOL N/A 01/23/2017   Procedure: COLONOSCOPY WITH PROPOFOL;  Surgeon: Lin Landsman, MD;  [facility];  Service: Gastroenterology;  Laterality: N/A;  . ESOPHAGOGASTRODUODENOSCOPY (EGD) WITH PROPOFOL N/A 01/23/2017   Procedure: ESOPHAGOGASTRODUODENOSCOPY (EGD) WITH PROPOFOL;  Surgeon: Lin Landsman, MD;  Location: [facility] ENDOSCOPY;  Service: Gastroenterology;  Laterality: N/A;  . MASS EXCISION N/A 05/20/2016   Procedure: EXCISION CHEST WALL MASS;  Surgeon: Florene Glen, MD;  Location: [facility] ORS;  Service: General;  Laterality: N/A;  . SENTINEL NODE BIOPSY Right 06/15/2015   Procedure: SENTINEL NODE BIOPSY;  Surgeon: Hubbard Robinson, MD;  Location: [facility] ORS;  Service: General;  Laterality: Right;  . TOTAL MASTECTOMY Right 05/22/2017   Procedure: TOTAL MASTECTOMY;  Surgeon: Florene Glen, MD;  Location: [facility] ORS;  Service: General;  Laterality: Right;  . TUBAL LIGATION  2004    FAMILY HISTORY Family History  Problem Relation Age of Onset  .  Hypertension Mother   . Hypertension Father   . Hypertension Sister   . Breast cancer Neg Hx        ADVANCED DIRECTIVES:    HEALTH MAINTENANCE: Social History   Tobacco Use  . Smoking status: Never Smoker  . Smokeless tobacco: Never Used  Substance Use Topics  . Alcohol use: Yes    Comment: WINE  ONCE MONTH  . Drug use: No    Allergies  Allergen Reactions  . Nitroglycerin Other (See Comments)    Headache, n/v.    Current Outpatient Medications  Medication Sig Dispense Refill  . Calcium Carb-Cholecalciferol (CALCIUM-VITAMIN D) 500-200 MG-UNIT tablet Take 1 tablet by mouth 2 (two) times daily.     . Ferrous Sulfate (IRON) 325 (65 Fe) MG TABS Take 1 tablet (325 mg total) by mouth 2 (two) times daily. 60 each 2  . hydrochlorothiazide (HYDRODIURIL) 25 MG tablet Take 25 mg by mouth daily.    . Liniments (BLUE-EMU SUPER STRENGTH EX) Apply 1 application topically daily as needed (for pain).    Marland Kitchen omeprazole (PRILOSEC) 20 MG capsule Take 1 capsule (20 mg total) by mouth daily. 30 capsule 3  . potassium chloride SA (K-DUR,KLOR-CON) 20 MEQ tablet Take 1 tablet (20 mEq total) by mouth 2 (two) times daily. 30 tablet 1  . tetrahydrozoline 0.05 % ophthalmic solution Place 2 drops into both eyes daily.    . diphenoxylate-atropine (LOMOTIL) 2.5-0.025 MG tablet Take 1 tablet by mouth 4 (four) times daily as needed for diarrhea or loose stools. (Patient not taking: Reported on 08/26/2017) 30 tablet 0  . HYDROcodone-acetaminophen (NORCO/VICODIN) 5-325 MG tablet Take 1-2 tablets by mouth every 4 (four) hours as needed for moderate pain. (Patient not taking: Reported on 08/26/2017) 30 tablet 0  . ondansetron (ZOFRAN) 8 MG tablet Take 1 tablet (8 mg total) by mouth 2 (two) times daily as needed for refractory nausea / vomiting. (Patient not taking: Reported on 08/26/2017) 30 tablet 2  . prochlorperazine (COMPAZINE) 10 MG tablet Take 1 tablet (10 mg total) by mouth every 6 (six) hours as needed (Nausea or vomiting). (Patient not taking: Reported on 08/26/2017) 60 tablet 2   No current facility-administered medications for this visit.     OBJECTIVE: Vitals:   08/26/17 0903  BP: 128/82  Pulse: 83  Resp: 18  Temp: (!) 83 F (28.3 C)     Body mass index is 52.31 kg/m.    ECOG FS:0 -  Asymptomatic  General: Well-developed, well-nourished, no acute distress. Eyes: Pink conjunctiva, anicteric sclera. Breast: Right mastectomy. Lungs: Clear to auscultation bilaterally. Heart: Regular rate and rhythm. No rubs, murmurs, or gallops. Abdomen: Soft, nontender, nondistended. No organomegaly noted, normoactive bowel sounds. Musculoskeletal: No edema, cyanosis, or clubbing. Neuro: Alert, answering all questions appropriately. Cranial nerves grossly intact. Skin: No rashes or petechiae noted. Psych: Normal affect.  LAB RESULTS:  Lab Results  Component Value Date   NA 139 08/26/2017   K 3.2 (L) 08/26/2017   CL 102 08/26/2017   CO2 28 08/26/2017   GLUCOSE 124 (H) 08/26/2017   BUN 16 08/26/2017   CREATININE 1.01 (H) 08/26/2017   CALCIUM 8.7 (L) 08/26/2017   PROT 7.0 08/26/2017   ALBUMIN 3.2 (L) 08/26/2017   AST 28 08/26/2017   ALT 27 08/26/2017   ALKPHOS 84 08/26/2017   BILITOT 0.4 08/26/2017   GFRNONAA >60 08/26/2017   GFRAA >60 08/26/2017    Lab Results  Component Value Date   WBC 6.0 08/26/2017   NEUTROABS  4.6 08/26/2017   HGB 10.6 (L) 08/26/2017   HCT 32.4 (L) 08/26/2017   MCV 78.7 (L) 08/26/2017   PLT 237 08/26/2017     STUDIES: No results found.  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 BRCA 1 and 2 negative, Oncotype DX score 17 which is considered low risk.  PLAN:    1. 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  This occurred while patient was taking Aromasin, therefore will likely switch to letrozole after completion of chemotherapy.  Oncotype was not sent on her second breast cancer.  Plan to give adjuvant chemotherapy using 4-6 cycles of Taxotere  and Cytoxan every 3 weeks.  CT scans and bone scans reviewed independently with no evidence of metastatic disease confirming stage I malignancy.  Proceed with cycle 4 of Taxotere and Cytoxan today.  Because of patient's difficulty with persistent nausea and diarrhea after cycle 3, she will return to IV fluids 3 times over the next week.  She was then return to clinic in 3 weeks for further evaluation and consideration of cycle 5.    2. Genetic testing: Patient was found to be BRCA 1 and 2 negative. She expressed understanding that although her genetic testing was negative, her children are at higher risk for breast cancer than the general population.   3. Bone mineral density: Bone density from September 04, 2016 reported Z score of 1.0 which is considered normal. Repeat in June 2020. 4.  Hypokalemia: Patient's potassium only mildly improved to 3.2.  Continue oral potassium supplementation. 5.  Diarrhea: Resolved.  Continue Imodium as needed. 6.  Right arm pain/swelling: Patient was previously given a referral to lymphedema clinic. 7.  Reaction to Taxotere: Patient was given additional premedications and instructed nursing not to increase rate during infusion.  Approximately 30 minutes was spent in discussion of which greater than 50% was consultation.   Patient expressed understanding and was in agreement with this plan. She also understands that She can call clinic at any time with any questions, concerns, or complaints.    Lloyd Huger, MD 08/29/17 6:44 AM

## 2017-08-26 ENCOUNTER — Inpatient Hospital Stay: Payer: Medicaid Other

## 2017-08-26 ENCOUNTER — Ambulatory Visit: Payer: Medicaid Other | Admitting: Occupational Therapy

## 2017-08-26 ENCOUNTER — Inpatient Hospital Stay (HOSPITAL_BASED_OUTPATIENT_CLINIC_OR_DEPARTMENT_OTHER): Payer: Medicaid Other | Admitting: Oncology

## 2017-08-26 ENCOUNTER — Other Ambulatory Visit: Payer: Self-pay

## 2017-08-26 VITALS — BP 128/82 | HR 83 | Temp 83.0°F | Resp 18 | Wt 354.2 lb

## 2017-08-26 VITALS — BP 115/80 | HR 78 | Resp 20

## 2017-08-26 DIAGNOSIS — I972 Postmastectomy lymphedema syndrome: Secondary | ICD-10-CM | POA: Diagnosis not present

## 2017-08-26 DIAGNOSIS — Z5111 Encounter for antineoplastic chemotherapy: Secondary | ICD-10-CM | POA: Diagnosis not present

## 2017-08-26 DIAGNOSIS — M6281 Muscle weakness (generalized): Secondary | ICD-10-CM

## 2017-08-26 DIAGNOSIS — M25511 Pain in right shoulder: Secondary | ICD-10-CM

## 2017-08-26 DIAGNOSIS — Z170 Estrogen receptor positive status [ER+]: Principal | ICD-10-CM

## 2017-08-26 DIAGNOSIS — R450 Nervousness: Secondary | ICD-10-CM | POA: Diagnosis not present

## 2017-08-26 DIAGNOSIS — M79601 Pain in right arm: Secondary | ICD-10-CM

## 2017-08-26 DIAGNOSIS — C50411 Malignant neoplasm of upper-outer quadrant of right female breast: Secondary | ICD-10-CM

## 2017-08-26 DIAGNOSIS — E876 Hypokalemia: Secondary | ICD-10-CM

## 2017-08-26 DIAGNOSIS — M7989 Other specified soft tissue disorders: Secondary | ICD-10-CM | POA: Diagnosis not present

## 2017-08-26 LAB — CBC WITH DIFFERENTIAL/PLATELET
BASOS PCT: 1 %
Basophils Absolute: 0 10*3/uL (ref 0–0.1)
Eosinophils Absolute: 0.1 10*3/uL (ref 0–0.7)
Eosinophils Relative: 2 %
HEMATOCRIT: 32.4 % — AB (ref 35.0–47.0)
HEMOGLOBIN: 10.6 g/dL — AB (ref 12.0–16.0)
LYMPHS ABS: 0.9 10*3/uL — AB (ref 1.0–3.6)
Lymphocytes Relative: 15 %
MCH: 25.9 pg — ABNORMAL LOW (ref 26.0–34.0)
MCHC: 32.8 g/dL (ref 32.0–36.0)
MCV: 78.7 fL — ABNORMAL LOW (ref 80.0–100.0)
MONO ABS: 0.4 10*3/uL (ref 0.2–0.9)
MONOS PCT: 7 %
NEUTROS ABS: 4.6 10*3/uL (ref 1.4–6.5)
NEUTROS PCT: 75 %
Platelets: 237 10*3/uL (ref 150–440)
RBC: 4.12 MIL/uL (ref 3.80–5.20)
RDW: 20.3 % — AB (ref 11.5–14.5)
WBC: 6 10*3/uL (ref 3.6–11.0)

## 2017-08-26 LAB — COMPREHENSIVE METABOLIC PANEL
ALK PHOS: 84 U/L (ref 38–126)
ALT: 27 U/L (ref 14–54)
AST: 28 U/L (ref 15–41)
Albumin: 3.2 g/dL — ABNORMAL LOW (ref 3.5–5.0)
Anion gap: 9 (ref 5–15)
BUN: 16 mg/dL (ref 6–20)
CALCIUM: 8.7 mg/dL — AB (ref 8.9–10.3)
CHLORIDE: 102 mmol/L (ref 101–111)
CO2: 28 mmol/L (ref 22–32)
CREATININE: 1.01 mg/dL — AB (ref 0.44–1.00)
GFR calc non Af Amer: >60 mL/min (ref >60)
GLUCOSE: 124 mg/dL — AB (ref 65–99)
Potassium: 3.2 mmol/L — ABNORMAL LOW (ref 3.5–5.1)
SODIUM: 139 mmol/L (ref 135–145)
Total Bilirubin: 0.4 mg/dL (ref 0.3–1.2)
Total Protein: 7 g/dL (ref 6.5–8.1)

## 2017-08-26 MED ORDER — SODIUM CHLORIDE 0.9 % IV SOLN
20.0000 mg | Freq: Once | INTRAVENOUS | Status: AC
  Administered 2017-08-26: 20 mg via INTRAVENOUS
  Filled 2017-08-26: qty 2

## 2017-08-26 MED ORDER — SODIUM CHLORIDE 0.9 % IV SOLN
Freq: Once | INTRAVENOUS | Status: AC
  Administered 2017-08-26: 10:00:00 via INTRAVENOUS
  Filled 2017-08-26: qty 1000

## 2017-08-26 MED ORDER — SODIUM CHLORIDE 0.9 % IV SOLN
190.0000 mg | Freq: Once | INTRAVENOUS | Status: AC
  Administered 2017-08-26: 190 mg via INTRAVENOUS
  Filled 2017-08-26: qty 19

## 2017-08-26 MED ORDER — FAMOTIDINE IN NACL 20-0.9 MG/50ML-% IV SOLN
20.0000 mg | Freq: Two times a day (BID) | INTRAVENOUS | Status: DC
Start: 2017-08-26 — End: 2017-08-26
  Administered 2017-08-26: 20 mg via INTRAVENOUS
  Filled 2017-08-26: qty 50

## 2017-08-26 MED ORDER — PALONOSETRON HCL INJECTION 0.25 MG/5ML
0.2500 mg | Freq: Once | INTRAVENOUS | Status: AC
  Administered 2017-08-26: 0.25 mg via INTRAVENOUS
  Filled 2017-08-26: qty 5

## 2017-08-26 MED ORDER — PEGFILGRASTIM 6 MG/0.6ML ~~LOC~~ PSKT
6.0000 mg | PREFILLED_SYRINGE | Freq: Once | SUBCUTANEOUS | Status: AC
  Administered 2017-08-26: 6 mg via SUBCUTANEOUS
  Filled 2017-08-26: qty 0.6

## 2017-08-26 MED ORDER — SODIUM CHLORIDE 0.9 % IV SOLN
600.0000 mg/m2 | Freq: Once | INTRAVENOUS | Status: AC
  Administered 2017-08-26: 1660 mg via INTRAVENOUS
  Filled 2017-08-26: qty 83

## 2017-08-26 MED ORDER — DIPHENHYDRAMINE HCL 50 MG/ML IJ SOLN
50.0000 mg | Freq: Once | INTRAMUSCULAR | Status: AC
  Administered 2017-08-26: 50 mg via INTRAVENOUS
  Filled 2017-08-26: qty 1

## 2017-08-26 NOTE — Progress Notes (Signed)
Here for follow up " im doing pretty good "

## 2017-08-26 NOTE — Therapy (Signed)
[facility] [address], [place], [address] Phone: [phone]   Fax:  [phone]  Occupational Therapy Treatment  Patient Details  Name: Michelle Mcmillan MRN: [id number] Date of Birth: 1970/11/02 Referring Provider: Grayland Ormond   Encounter Date: 08/26/2017  OT End of Session - 08/26/17 1702    Visit Number  3    Number of Visits  4    Date for OT Re-Evaluation  09/03/17    OT Start Time  1420    OT Stop Time  1501    OT Time Calculation (min)  41 min    Activity Tolerance  Patient tolerated treatment well    Behavior During Therapy  [facility] for tasks assessed/performed       Past Medical History:  Diagnosis Date  . Anemia    H/O  . Arthritis    RIGHT KNEE  . Breast cancer ([place]) 2017   right breast  . Cancer ([place])    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)  . GERD (gastroesophageal reflux disease)    NO MEDS  . Headache    migraines  . Hypertension   . Last menstrual period (LMP) > 10 days ago 08/2015  . Personal history of radiation therapy     Past Surgical History:  Procedure Laterality Date  . BREAST BIOPSY Right 05/08/2017   invasive mamm. ca  . BREAST EXCISIONAL BIOPSY Right 06/15/15   breast cancer  . BREAST LUMPECTOMY WITH NEEDLE LOCALIZATION Right 06/15/2015   Procedure: BREAST LUMPECTOMY WITH NEEDLE LOCALIZATION;  Surgeon: Hubbard Robinson, MD;  Location: [facility] ORS;  Service: General;  Laterality: Right;  . BREAST SURGERY    . CESAREAN SECTION  1988   x1  . COLONOSCOPY WITH PROPOFOL N/A 01/23/2017   Procedure: COLONOSCOPY WITH PROPOFOL;  Surgeon: Lin Landsman, MD;  Location: [facility] ENDOSCOPY;  Service: Gastroenterology;  Laterality: N/A;  . ESOPHAGOGASTRODUODENOSCOPY (EGD) WITH PROPOFOL N/A 01/23/2017   Procedure: ESOPHAGOGASTRODUODENOSCOPY (EGD) WITH PROPOFOL;  Surgeon: Lin Landsman, MD;  Location: [facility] ENDOSCOPY;  Service: Gastroenterology;  Laterality: N/A;  . MASS EXCISION N/A  05/20/2016   Procedure: EXCISION CHEST WALL MASS;  Surgeon: Florene Glen, MD;  Location: [facility] ORS;  Service: General;  Laterality: N/A;  . SENTINEL NODE BIOPSY Right 06/15/2015   Procedure: SENTINEL NODE BIOPSY;  Surgeon: Hubbard Robinson, MD;  Location: [facility] ORS;  Service: General;  Laterality: Right;  . TOTAL MASTECTOMY Right 05/22/2017   Procedure: TOTAL MASTECTOMY;  Surgeon: Florene Glen, MD;  Location: [facility] ORS;  Service: General;  Laterality: Right;  . TUBAL LIGATION  2004    There were no vitals filed for this visit.  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  Yes    Pain Score  3     Pain Location  Shoulder    Pain Orientation  Right    Pain Descriptors / Indicators  Aching;Tender    Pain Type  Acute pain    Pain Onset  In the past 7 days          LYMPHEDEMA/ONCOLOGY QUESTIONNAIRE - 08/26/17 1436      Right Upper Extremity Lymphedema   15 cm Proximal to Olecranon Process  48 cm    10 cm Proximal to Olecranon Process  45 cm    Olecranon Process  35.2 cm    15 cm Proximal to Ulnar Styloid Process  32 cm    10 cm Proximal to Ulnar Styloid Process  28 cm    Just Proximal to Ulnar Styloid Process  21.2 cm    Across Hand at [employer]  21 cm    At [place] of 2nd Digit  6.5 cm    At [facility] of Thumb  6.5 cm        Measure R UE circumference - see flowsheet   assess use and fit of jovipak breast pad  Scar massage and mobs  done - fibrotic tech review - scar moving better and over chest less tightness -and fribrosis  Report decrease edema in chest and under arm  Pain over anterior R shoulder   discuss with pt positioning of R arm , and posture - pt sit with R shoulder forward and rounded  Pt to cont with same HEP but do ice  During sitting and sleeping - keep arm out of way  Pt used R arm a lot with cooking per pt - the last few days       OT Treatments/Exercises (OP) - 08/26/17 0001      Iontophoresis   Type of Iontophoresis  Dexamethasone    Location  anterior shoulder    Dose  large patch, 2.0 current     Time  19      skin check done - no issues        OT Education - 08/26/17 1701    Education provided  Yes    Education Details  compression garments wearing , HEP and posture and positioning of R shoulder and arm     Person(s) Educated  Patient    Methods  Explanation;Demonstration    Comprehension  Verbalized understanding;Returned demonstration       OT Short Term Goals - 07/23/17 2011      OT SHORT TERM GOAL #1   Title  Pain in R shoulder and under arm  decrease to less than 3/10 at the worse to sleep better     Baseline  pain can increase to 6-7/10 at the worse and best 3/10     Time  3    Period  Weeks    Status  New    Target Date  08/13/17      OT SHORT TERM GOAL #2   Title  Pt to be independent in HEP to wear compression sleeve and glove to maintain circumference in R UE     Baseline  no compression and pt report arm increase in swelling towards the end of day - and sleeves is tight and cannot get jewelry on     Time  2    Period  Weeks    Status  New    Target Date  08/06/17        OT  Long Term Goals - 07/23/17 2111      OT LONG TERM GOAL #1   Title  R shoulder AROM improve for pt to reach over head , and donn/doff  sweater over head without increase  symptoms     Baseline  R shoulder flexion 120 , ABD 85 and ext 45     Time  4    Period  Weeks    Status  New     Target Date  08/20/17      OT LONG TERM GOAL #2   Title  R shoulder strength increase for pt do house work , carry more than 5 lbs without increase symptoms     Baseline  pain with AROM to 85 ABD ,shoulder flexion 120 - cannot tolerate resistance and tender over bicep and supraspinatus - as well as upper traps     Time  6    Period  Weeks    Status  New    Target Date  09/03/17            Plan - 08/26/17 1703    Clinical Impression Statement  Pt show improvement in lymphedema in R chest and under arm - and in scar tissue - not as adhere and circumference in R UE did not increase with use of jovipak breast pad - waiting for compressoin sleeve and glove for arm - pt to contact me if she gets it in - pt had increase shoulder pain and tenderness over anterior shoulder and rounded shoulder - pt ed on ice again and positioning and  posture to decrease R shoulder pain     Occupational performance deficits (Please refer to evaluation for details):  ADL's;IADL's    Rehab Potential  Good    OT Frequency  1x / week    OT Duration  4 weeks    OT Treatment/Interventions  Self-care/ADL training;Therapeutic exercise;Manual lymph drainage;Manual Therapy;Passive range of motion;Scar mobilization;Patient/family education    Plan  assess progress with HEP  and fit of garments  - use of it and shoulder pain ?     Clinical Decision Making  Several treatment options, min-mod task modification necessary    OT Home Exercise Plan  see pt instruction     Consulted and Agree with Plan of Care  Patient       Patient will benefit from skilled therapeutic intervention in order to improve the following deficits and impairments:  Pain, Increased edema, Impaired flexibility, Decreased scar mobility, Decreased strength, Decreased range of motion, Impaired UE functional use  Visit Diagnosis: Postmastectomy lymphedema syndrome  Acute pain of right shoulder  Muscle weakness (generalized)    Problem  List Patient Active Problem List   Diagnosis Date Noted  . Intractable nausea and vomiting 08/11/2017  . Breast cancer ([place]) 05/22/2017  . Morbid (severe) obesity due to excess calories ([place]) 04/15/2017  . Microcytic anemia 04/15/2017  . GERD (gastroesophageal reflux disease) 04/15/2017  . Chest wall mass   . Malignant neoplasm of upper-outer quadrant of right female breast ([place]) 05/25/2015  . HTN (hypertension) 05/24/2015    Rosalyn Gess OTR/L,CLT 08/26/2017, 5:06 PM  [facility] [address], [place], [address] Phone: [phone]   Fax:  [phone]  Name: Michelle Mcmillan MRN: [id number] Date of Birth: [birth year]

## 2017-08-26 NOTE — Patient Instructions (Signed)
Review again use and positioning of R arm - sleeping ,sitting and using it   ice massage  And posture  And same HEP  And wear jovipak breast pad as much as she can

## 2017-08-29 ENCOUNTER — Encounter: Payer: Self-pay | Admitting: Nurse Practitioner

## 2017-08-29 ENCOUNTER — Inpatient Hospital Stay: Payer: Medicaid Other

## 2017-08-29 ENCOUNTER — Inpatient Hospital Stay (HOSPITAL_BASED_OUTPATIENT_CLINIC_OR_DEPARTMENT_OTHER): Payer: Medicaid Other | Admitting: Nurse Practitioner

## 2017-08-29 VITALS — BP 120/83 | HR 76 | Temp 99.3°F | Resp 18

## 2017-08-29 DIAGNOSIS — R5383 Other fatigue: Secondary | ICD-10-CM

## 2017-08-29 DIAGNOSIS — R110 Nausea: Secondary | ICD-10-CM | POA: Diagnosis not present

## 2017-08-29 DIAGNOSIS — T451X5A Adverse effect of antineoplastic and immunosuppressive drugs, initial encounter: Secondary | ICD-10-CM | POA: Diagnosis not present

## 2017-08-29 DIAGNOSIS — Z170 Estrogen receptor positive status [ER+]: Secondary | ICD-10-CM

## 2017-08-29 DIAGNOSIS — I10 Essential (primary) hypertension: Secondary | ICD-10-CM

## 2017-08-29 DIAGNOSIS — C50411 Malignant neoplasm of upper-outer quadrant of right female breast: Secondary | ICD-10-CM

## 2017-08-29 DIAGNOSIS — R5381 Other malaise: Secondary | ICD-10-CM

## 2017-08-29 DIAGNOSIS — Z5111 Encounter for antineoplastic chemotherapy: Secondary | ICD-10-CM | POA: Diagnosis not present

## 2017-08-29 DIAGNOSIS — G620 Drug-induced polyneuropathy: Secondary | ICD-10-CM | POA: Diagnosis not present

## 2017-08-29 MED ORDER — SODIUM CHLORIDE 0.9 % IV SOLN
Freq: Once | INTRAVENOUS | Status: AC
  Administered 2017-08-29: 14:00:00 via INTRAVENOUS
  Filled 2017-08-29: qty 1000

## 2017-08-29 MED ORDER — PROMETHAZINE HCL 25 MG/ML IJ SOLN
25.0000 mg | Freq: Once | INTRAMUSCULAR | Status: AC
  Administered 2017-08-29: 25 mg via INTRAVENOUS
  Filled 2017-08-29: qty 1

## 2017-08-29 MED ORDER — HEPARIN SOD (PORK) LOCK FLUSH 100 UNIT/ML IV SOLN: 500.0000 [IU] | Freq: Once | INTRAVENOUS | Status: DC | PRN

## 2017-08-29 MED ORDER — SODIUM CHLORIDE 0.9% FLUSH
10.0000 mL | INTRAVENOUS | Status: DC | PRN
  Filled 2017-08-29: qty 10

## 2017-08-29 MED ORDER — DULOXETINE HCL 30 MG PO CPEP: ORAL_CAPSULE | ORAL | 0 refills | Status: DC

## 2017-08-29 MED ORDER — ALTEPLASE 2 MG IJ SOLR: 2.0000 mg | Freq: Once | INTRAMUSCULAR | Status: DC | PRN

## 2017-08-29 MED ORDER — HEPARIN SOD (PORK) LOCK FLUSH 100 UNIT/ML IV SOLN: 250.0000 [IU] | Freq: Once | INTRAVENOUS | Status: DC | PRN

## 2017-08-29 NOTE — Progress Notes (Signed)
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 Grayland Ormond, on 08/26/2017.  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  Pathology from mastectomy specimen on 05/22/2017 indicates that this is most likely a second primary and not a local recurrence of disease.  This occurred while patient was taking Aromasin and per Dr. Gary Fleet note anticipate switching to letrozole after completion of chemotherapy.  Plan for patient to receive adjuvant chemotherapy using 4-6 cycles of Taxotere and Cytoxan every 3 weeks.  Previously CT scans and bone scans showed no evidence of metastatic disease confirming stage I malignancy.  BRCA1/2 negative. Patient has not tolerated chemotherapy well and has required IV fluids and supportive care throughout. She was seen in [facility] on 08/11/17 for intractable nausea and vomiting that required hospitalization with discharge on 08/12/17. She was treated with IV hydration and anti-emetics. She received electrolyte replacement.   Interval history- She describes symptoms of numbness, burning and tingling of fingers and feet.  Onset of symptoms was sudden, starting about 2 days ago. Symptoms are currently of moderate severity. Symptoms occur intermittently and last hours. She denies  orthostasis, bloating and constipation.  She has not had these symptoms previously and has not had evaluation to date for these symptoms. She reports that she has one cycle of chemotherapy remaining.   ECOG FS:2 - Symptomatic, <50% confined to bed  Review of systems- Review of Systems  Constitutional: Positive for malaise/fatigue. Negative for chills, fever and weight loss.  HENT: Negative for congestion, ear discharge, ear pain, sinus pain, sore throat and tinnitus.   Eyes: Negative.   Respiratory: Negative.  Negative for cough, sputum production and shortness of breath.   Cardiovascular: Negative for chest pain, palpitations, orthopnea, claudication and leg swelling.  Gastrointestinal: Positive for nausea. Negative for abdominal pain, blood in stool, constipation, diarrhea, heartburn and vomiting.  Genitourinary: Negative.   Musculoskeletal: Negative.   Skin: Negative.   Neurological: Positive for tingling and weakness. Negative for dizziness and headaches.  Endo/Heme/Allergies: Negative.   Psychiatric/Behavioral: Negative.     Current treatment- Cytoxan and Docetaxel   Allergies  Allergen Reactions  . Nitroglycerin Other (See Comments)    Headache, n/v.    Past Medical History:  Diagnosis Date  . Anemia    H/O  . Arthritis    RIGHT KNEE  . Breast cancer ([place]) 2017   right breast  . Cancer ([place])    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)  . GERD (gastroesophageal reflux disease)    NO MEDS  . Headache    migraines  . Hypertension   . Last menstrual period (LMP) > 10 days ago 08/2015  . Personal history of radiation therapy     Past Surgical History:  Procedure Laterality Date  . BREAST BIOPSY Right 05/08/2017   invasive mamm. ca  . BREAST EXCISIONAL BIOPSY Right 06/15/15   breast cancer  . BREAST LUMPECTOMY WITH  NEEDLE LOCALIZATION Right 06/15/2015   Procedure: BREAST LUMPECTOMY WITH NEEDLE LOCALIZATION;  Surgeon: Hubbard Robinson, MD;  Location: [facility] ORS;  Service: General;  Laterality: Right;  . BREAST SURGERY      . CESAREAN SECTION  1988   x1  . COLONOSCOPY WITH PROPOFOL N/A 01/23/2017   Procedure: COLONOSCOPY WITH PROPOFOL;  Surgeon: Lin Landsman, MD;  Location: [facility] ENDOSCOPY;  Service: Gastroenterology;  Laterality: N/A;  . ESOPHAGOGASTRODUODENOSCOPY (EGD) WITH PROPOFOL N/A 01/23/2017   Procedure: ESOPHAGOGASTRODUODENOSCOPY (EGD) WITH PROPOFOL;  Surgeon: Lin Landsman, MD;  Location: [facility] ENDOSCOPY;  Service: Gastroenterology;  Laterality: N/A;  . MASS EXCISION N/A 05/20/2016   Procedure: EXCISION CHEST WALL MASS;  Surgeon: Florene Glen, MD;  Location: [facility] ORS;  Service: General;  Laterality: N/A;  . SENTINEL NODE BIOPSY Right 06/15/2015   Procedure: SENTINEL NODE BIOPSY;  Surgeon: Hubbard Robinson, MD;  Location: [facility] ORS;  Service: General;  Laterality: Right;  . TOTAL MASTECTOMY Right 05/22/2017   Procedure: TOTAL MASTECTOMY;  Surgeon: Florene Glen, MD;  Location: [facility] ORS;  Service: General;  Laterality: Right;  . TUBAL LIGATION  2004    Social History   Socioeconomic History  . Marital status: Married    Spouse name: Not on file  . Number of children: Not on file  . Years of education: Not on file  . Highest education level: Not on file  Occupational History  . Not on file  Social Needs  . Financial resource strain: Not on file  . Food insecurity:    Worry: Not on file    Inability: Not on file  . Transportation needs:    Medical: Not on file    Non-medical: Not on file  Tobacco Use  . Smoking status: Never Smoker  . Smokeless tobacco: Never Used  Substance and Sexual Activity  . Alcohol use: Yes    Comment: WINE ONCE MONTH  . Drug use: No  . Sexual activity: Not on file  Lifestyle  . Physical activity:    Days per week: Not on file    Minutes per session: Not on file  . Stress: Not on file  Relationships  . Social connections:    Talks on phone: Not on file    Gets together: Not on file    Attends religious service: Not on file    Active  member of club or organization: Not on file    Attends meetings of clubs or organizations: Not on file    Relationship status: Not on file  . Intimate partner violence:    Fear of current or ex partner: Not on file    Emotionally abused: Not on file    Physically abused: Not on file    Forced sexual activity: Not on file  Other Topics Concern  . Not on file  Social History Narrative  . Not on file    Family History  Problem Relation Age of Onset  . Hypertension Mother   . Hypertension Father   . Hypertension Sister   . Breast cancer Neg Hx      Current Outpatient Medications:  .  Calcium Carb-Cholecalciferol (CALCIUM-VITAMIN D) 500-200 MG-UNIT tablet, Take 1 tablet by mouth 2 (two) times daily. , Disp: , Rfl:  .  diphenoxylate-atropine (LOMOTIL) 2.5-0.025 MG tablet, Take 1 tablet by mouth 4 (four) times daily as needed for diarrhea or loose stools. (Patient not taking: Reported on 08/26/2017), Disp: 30 tablet, Rfl: 0 .  Ferrous Sulfate (IRON) 325 (  65 Fe) MG TABS, Take 1 tablet (325 mg total) by mouth 2 (two) times daily., Disp: 60 each, Rfl: 2 .  hydrochlorothiazide (HYDRODIURIL) 25 MG tablet, Take 25 mg by mouth daily., Disp: , Rfl:  .  HYDROcodone-acetaminophen (NORCO/VICODIN) 5-325 MG tablet, Take 1-2 tablets by mouth every 4 (four) hours as needed for moderate pain. (Patient not taking: Reported on 08/26/2017), Disp: 30 tablet, Rfl: 0 .  Liniments (BLUE-EMU SUPER STRENGTH EX), Apply 1 application topically daily as needed (for pain)., Disp: , Rfl:  .  omeprazole (PRILOSEC) 20 MG capsule, Take 1 capsule (20 mg total) by mouth daily., Disp: 30 capsule, Rfl: 3 .  ondansetron (ZOFRAN) 8 MG tablet, Take 1 tablet (8 mg total) by mouth 2 (two) times daily as needed for refractory nausea / vomiting. (Patient not taking: Reported on 08/26/2017), Disp: 30 tablet, Rfl: 2 .  potassium chloride SA (K-DUR,KLOR-CON) 20 MEQ tablet, Take 1 tablet (20 mEq total) by mouth 2 (two) times daily., Disp: 30  tablet, Rfl: 1 .  prochlorperazine (COMPAZINE) 10 MG tablet, Take 1 tablet (10 mg total) by mouth every 6 (six) hours as needed (Nausea or vomiting). (Patient not taking: Reported on 08/26/2017), Disp: 60 tablet, Rfl: 2 .  tetrahydrozoline 0.05 % ophthalmic solution, Place 2 drops into both eyes daily., Disp: , Rfl:  No current facility-administered medications for this visit.   Facility-Administered Medications Ordered in Other Visits:  .  alteplase (CATHFLO ACTIVASE) injection 2 mg, 2 mg, Intracatheter, Once PRN, Grayland Ormond, Kathlene November, MD .  heparin lock flush 100 unit/mL, 250 Units, Intracatheter, Once PRN, Grayland Ormond, Kathlene November, MD .  heparin lock flush 100 unit/mL, 500 Units, Intracatheter, Once PRN, Grayland Ormond, Kathlene November, MD .  sodium chloride flush (NS) 0.9 % injection 10 mL, 10 mL, Intracatheter, PRN, Lloyd Huger, MD  Physical exam:  Vitals:   08/29/17 1454 08/29/17 1455  BP:  120/83  Pulse:  76  Resp:  18  Temp: 99.3 F (37.4 C) 99.3 F (37.4 C)  TempSrc: Tympanic Oral   Physical Exam  Constitutional: She is oriented to person, place, and time. She appears well-developed and well-nourished.  Fatigued appearing. In infusion, receiving NaCl. Husband accompanies  HENT:  Head: Atraumatic.  Nose: Nose normal.  Mouth/Throat: Oropharynx is clear and moist. No oropharyngeal exudate.  Eyes: Conjunctivae are normal. No scleral icterus.  Neck: Normal range of motion.  Cardiovascular: Normal rate, regular rhythm and normal heart sounds.  Pulmonary/Chest: Effort normal and breath sounds normal.  Abdominal: Soft. Bowel sounds are normal.  Musculoskeletal: She exhibits no edema.  Neurological: She is alert and oriented to person, place, and time.  Skin: Skin is warm and dry.  Psychiatric: She has a normal mood and affect.     CMP Latest Ref Rng & Units 08/26/2017  Glucose 65 - 99 mg/dL 124(H)  BUN 6 - 20 mg/dL 16  Creatinine 0.44 - 1.00 mg/dL 1.01(H)  Sodium 135 - 145 mmol/L 139   Potassium 3.5 - 5.1 mmol/L 3.2(L)  Chloride 101 - 111 mmol/L 102  CO2 22 - 32 mmol/L 28  Calcium 8.9 - 10.3 mg/dL 8.7(L)  Total Protein 6.5 - 8.1 g/dL 7.0  Total Bilirubin 0.3 - 1.2 mg/dL 0.4  Alkaline Phos 38 - 126 U/L 84  AST 15 - 41 U/L 28  ALT 14 - 54 U/L 27   CBC Latest Ref Rng & Units 08/26/2017  WBC 3.6 - 11.0 K/uL 6.0  Hemoglobin 12.0 - 16.0 g/dL 10.6(L)  Hematocrit  35.0 - 47.0 % 32.4(L)  Platelets 150 - 440 K/uL 237    No images are attached to the encounter.  No results found.   Assessment and plan- Patient is a 47 y.o. female with breast cancer who presents to [facility] for numbness and tingling.   1. Stage Ia ER/PR +, HER-2/neu - adenocarcinoma of the upper outer quadrant of the right breast. BRCA 1/2 negative. Oncotype DX score 17 (low risk). Currently s/p cycle 4 of Taxotere and Cytoxan with plan to complete 5 cycles.   2. Chemotherapy Induced Peripheral Neuropathy- Will start on cymbalta 10m for one week. If tolerating well, discussed increasing to 623mdaily. Given slow onset of medications, will also send referral for acupuncture for assessment and treatment.   Return to clinic as scheduled and follow-up with Dr. FIGrayland Ormondn 09/16/17 for evaluation prior to cycle 5.   Visit Diagnosis 1. Malignant neoplasm of upper-outer quadrant of right breast in female, [place] receptor positive ([place]  2. [facility]    Patient expressed understanding and was in agreement with this plan. She also understands that She can call clinic at any time with any questions, concerns, or complaints.   A total of (15) minutes of face-to-face time was spent with this patient with greater than 50% of that time in counseling and care-coordination.   LaBeckey RutterDNP, AGNP-C CaSqu[facility]le[phone] cell) [phone]) 08/29/17 4:48 PM

## 2017-08-29 NOTE — Patient Instructions (Signed)

## 2017-09-01 ENCOUNTER — Inpatient Hospital Stay: Payer: Medicaid Other

## 2017-09-03 ENCOUNTER — Inpatient Hospital Stay: Payer: Medicaid Other

## 2017-09-03 ENCOUNTER — Ambulatory Visit: Payer: Medicaid Other | Admitting: Occupational Therapy

## 2017-09-14 NOTE — Progress Notes (Signed)
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   INTERVAL HISTORY: Patient returns to clinic today for further evaluation and consideration of cycle 5 of Taxotere and Cytoxan.  She was recently prescribed Cymbalta, but is wishing to discontinue because it makes her feel "spacey".  She continues to have peripheral neuropathy.  She otherwise feels well.  She has no neurologic complaints. She denies any recent fevers or illnesses. She has a good appetite and denies weight loss. She has no chest pain or shortness of breath.  She denies any nausea, vomiting, constipation, or diarrhea.  She has no urinary complaints.  Patient offers no specific complaints today.  REVIEW OF SYSTEMS:   Review of Systems  Constitutional: Negative.  Negative for fever, malaise/fatigue and weight loss.  Respiratory: Negative.  Negative for cough and shortness of breath.   Cardiovascular: Negative.  Negative for chest pain and leg swelling.  Gastrointestinal: Negative for abdominal pain, constipation, diarrhea, nausea and vomiting.  Genitourinary: Negative.  Negative for dysuria.  Musculoskeletal: Negative.  Negative for myalgias.  Skin: Negative.  Negative for rash.  Neurological: Positive for tingling and sensory change. Negative for focal weakness and weakness.  Psychiatric/Behavioral: The patient is nervous/anxious.     As per HPI. Otherwise, a complete review of systems is negative.  PAST MEDICAL HISTORY: Past Medical History:  Diagnosis Date  . Anemia    H/O  . Arthritis    RIGHT KNEE  .  Breast cancer ([place]) 2017   right breast  . Cancer ([place])    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)  . GERD (gastroesophageal reflux disease)    NO MEDS  . Headache    migraines  . Hypertension   . Last menstrual period (LMP) > 10 days ago 08/2015  . Personal history of radiation therapy     PAST SURGICAL HISTORY: Past Surgical History:  Procedure Laterality Date  . BREAST BIOPSY Right 05/08/2017   invasive mamm. ca  . BREAST EXCISIONAL BIOPSY Right 06/15/15   breast cancer  . BREAST LUMPECTOMY WITH NEEDLE LOCALIZATION Right 06/15/2015   Procedure: BREAST LUMPECTOMY WITH NEEDLE LOCALIZATION;  Surgeon: Hubbard Robinson, MD;  Location: [facility] ORS;  Service: General;  Laterality: Right;  . BREAST SURGERY    . CESAREAN SECTION  1988   x1  . COLONOSCOPY WITH PROPOFOL N/A 01/23/2017   Procedure: COLONOSCOPY WITH PROPOFOL;  Surgeon: Lin Landsman, MD;  Location: [facility] ENDOSCOPY;  Service: Gastroenterology;  Laterality: N/A;  . ESOPHAGOGASTRODUODENOSCOPY (EGD) WITH PROPOFOL N/A 01/23/2017   Procedure: ESOPHAGOGASTRODUODENOSCOPY (EGD) WITH PROPOFOL;  Surgeon: Lin Landsman, MD;  Location[facility] ENDOSCOPY;  Service: Gastroenterology;  Laterality: N/A;  . MASS EXCISION N/A 05/20/2016   Procedure: EXCISION CHEST WALL MASS;  Surgeon: Florene Glen, MD;  Location: [facility] ORS;  Service: General;  Laterality: N/A;  . SENTINEL NODE BIOPSY Right 06/15/2015   Procedure: SENTINEL NODE BIOPSY;  Surgeon: Hubbard Robinson, MD;  Location: [facility] ORS;  Service: General;  Laterality: Right;  . TOTAL MASTECTOMY Right 05/22/2017   Procedure: TOTAL MASTECTOMY;  Surgeon: Florene Glen, MD;  Location: [facility] ORS;  Service: General;  Laterality:  Right;  Marland Kitchen TUBAL LIGATION  2004    FAMILY HISTORY Family History  Problem Relation Age of Onset  . Hypertension Mother   . Hypertension Father   . Hypertension Sister   . Breast cancer Neg Hx        ADVANCED DIRECTIVES:    HEALTH MAINTENANCE: Social History    Tobacco Use  . Smoking status: Never Smoker  . Smokeless tobacco: Never Used  Substance Use Topics  . Alcohol use: Yes    Comment: WINE ONCE MONTH  . Drug use: No    Allergies  Allergen Reactions  . Nitroglycerin Other (See Comments)    Headache, n/v.    Current Outpatient Medications  Medication Sig Dispense Refill  . Calcium Carb-Cholecalciferol (CALCIUM-VITAMIN D) 500-200 MG-UNIT tablet Take 1 tablet by mouth 2 (two) times daily.     . diphenoxylate-atropine (LOMOTIL) 2.5-0.025 MG tablet Take 1 tablet by mouth 4 (four) times daily as needed for diarrhea or loose stools. 30 tablet 0  . DULoxetine (CYMBALTA) 30 MG capsule Take one capsule (11m) by mouth daily for 7 days then 2 capsules (686m by mouth daily. 49 capsule 0  . Ferrous Sulfate (IRON) 325 (65 Fe) MG TABS Take 1 tablet (325 mg total) by mouth 2 (two) times daily. 60 each 2  . hydrochlorothiazide (HYDRODIURIL) 25 MG tablet Take 25 mg by mouth daily.    . Liniments (BLUE-EMU SUPER STRENGTH EX) Apply 1 application topically daily as needed (for pain).    . Marland Kitchenmeprazole (PRILOSEC) 20 MG capsule Take 1 capsule (20 mg total) by mouth daily. 30 capsule 3  . potassium chloride SA (K-DUR,KLOR-CON) 20 MEQ tablet Take 1 tablet (20 mEq total) by mouth 2 (two) times daily. 30 tablet 1  . tetrahydrozoline 0.05 % ophthalmic solution Place 2 drops into both eyes daily.    . Marland Kitchenabapentin (NEURONTIN) 300 MG capsule Take 1 capsule (300 mg total) by mouth 2 (two) times daily. 60 capsule 2  . HYDROcodone-acetaminophen (NORCO/VICODIN) 5-325 MG tablet Take 1-2 tablets by mouth every 4 (four) hours as needed for moderate pain. (Patient not taking: Reported on 09/16/2017) 30 tablet 0  . ondansetron (ZOFRAN) 8 MG tablet Take 1 tablet (8 mg total) by mouth 2 (two) times daily as needed for refractory nausea / vomiting. (Patient not taking: Reported on 09/16/2017) 30 tablet 2  . prochlorperazine (COMPAZINE) 10 MG tablet Take 1 tablet (10 mg total) by  mouth every 6 (six) hours as needed (Nausea or vomiting). (Patient not taking: Reported on 09/16/2017) 60 tablet 2   No current facility-administered medications for this visit.     OBJECTIVE: Vitals:   09/16/17 0859  BP: (!) 144/88  Pulse: 91  Resp: 18  Temp: (!) 95.8 F (35.4 C)     Body mass index is 51.86 kg/m.    ECOG FS:0 - Asymptomatic  General: Well-developed, well-nourished, no acute distress. Eyes: Pink conjunctiva, anicteric sclera. Breast: Right mastectomy.  Exam deferred today. Lungs: Clear to auscultation bilaterally. Heart: Regular rate and rhythm. No rubs, murmurs, or gallops. Abdomen: Soft, nontender, nondistended. No organomegaly noted, normoactive bowel sounds. Musculoskeletal: No edema, cyanosis, or clubbing. Neuro: Alert, answering all questions appropriately. Cranial nerves grossly intact. Skin: No rashes or petechiae noted. Psych: Normal affect.  LAB RESULTS:  Lab Results  Component Value Date   NA 140 09/16/2017   K 3.2 (L) 09/16/2017   CL 105 09/16/2017   CO2 27 09/16/2017   GLUCOSE 151 (H) 09/16/2017   BUN 16 09/16/2017  CREATININE 1.15 (H) 09/16/2017   CALCIUM 8.8 (L) 09/16/2017   PROT 6.7 09/16/2017   ALBUMIN 3.3 (L) 09/16/2017   AST 29 09/16/2017   ALT 28 09/16/2017   ALKPHOS 81 09/16/2017   BILITOT 0.3 09/16/2017   GFRNONAA 56 (L) 09/16/2017   GFRAA >60 09/16/2017    Lab Results  Component Value Date   WBC 7.1 09/16/2017   NEUTROABS 5.3 09/16/2017   HGB 10.7 (L) 09/16/2017   HCT 32.6 (L) 09/16/2017   MCV 78.7 (L) 09/16/2017   PLT 253 09/16/2017     STUDIES: No results found.  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 BRCA 1 and 2 negative, Oncotype DX score 17 which is considered low risk.  PLAN:    1. Pathologic stage Ia ER/PR positive, HER-2 negative adenocarcinoma of the upper outer quadrant of the right breast: Pathology from  mastectomy specimen on May 22, 2017 indicates that this is most likely a second primary and not a local recurrence of disease.  This occurred while patient was taking Aromasin, therefore will likely switch to letrozole after completion of chemotherapy.  Oncotype was not sent on her second breast cancer.  CT scans and bone scans reviewed independently with no evidence of metastatic disease confirming stage I malignancy.  Proceed with cycle 5 of Taxotere and Cytoxan today.  Patient is having an additional cycle secondary to a reaction during cycle 1 which she did not complete.  Return to clinic several times next week for IV fluids given her persistent nausea with previous treatment.  Patient will then return to clinic in 1 month with repeat laboratory work and further evaluation.  2. Genetic testing: Patient was found to be BRCA 1 and 2 negative. She expressed understanding that although her genetic testing was negative, her children are at higher risk for breast cancer than the general population.   3. Bone mineral density: Bone density from September 04, 2016 reported Z score of 1.0 which is considered normal. Repeat in June 2020. 4.  Hypokalemia: Patient's potassium is unchanged at 3.2.  Continue oral potassium supplementation. 5.  Diarrhea: Resolved.  Continue Imodium as needed. 6.  Right arm pain/swelling: Patient was previously given a referral to lymphedema clinic. 7.  Neuropathy: Patient has been instructed to discontinue Cymbalta and was given a prescription for gabapentin. 8.  Cymbalta: Patient has been instructed to taper off as directed.   Patient expressed understanding and was in agreement with this plan. She also understands that She can call clinic at any time with any questions, concerns, or complaints.    Lloyd Huger, MD 09/21/17 6:55 AM

## 2017-09-16 ENCOUNTER — Inpatient Hospital Stay: Payer: Medicaid Other

## 2017-09-16 ENCOUNTER — Encounter: Payer: Self-pay | Admitting: Oncology

## 2017-09-16 ENCOUNTER — Inpatient Hospital Stay: Payer: Medicaid Other | Attending: Oncology

## 2017-09-16 ENCOUNTER — Inpatient Hospital Stay (HOSPITAL_BASED_OUTPATIENT_CLINIC_OR_DEPARTMENT_OTHER): Payer: Medicaid Other | Admitting: Oncology

## 2017-09-16 ENCOUNTER — Other Ambulatory Visit: Payer: Self-pay

## 2017-09-16 VITALS — BP 124/75 | HR 74

## 2017-09-16 VITALS — BP 144/88 | HR 91 | Temp 95.8°F | Resp 18 | Wt 351.2 lb

## 2017-09-16 DIAGNOSIS — Z923 Personal history of irradiation: Secondary | ICD-10-CM

## 2017-09-16 DIAGNOSIS — Z170 Estrogen receptor positive status [ER+]: Secondary | ICD-10-CM

## 2017-09-16 DIAGNOSIS — Z9011 Acquired absence of right breast and nipple: Secondary | ICD-10-CM

## 2017-09-16 DIAGNOSIS — I10 Essential (primary) hypertension: Secondary | ICD-10-CM | POA: Diagnosis not present

## 2017-09-16 DIAGNOSIS — C50411 Malignant neoplasm of upper-outer quadrant of right female breast: Secondary | ICD-10-CM | POA: Diagnosis not present

## 2017-09-16 DIAGNOSIS — M7989 Other specified soft tissue disorders: Secondary | ICD-10-CM

## 2017-09-16 DIAGNOSIS — M79601 Pain in right arm: Secondary | ICD-10-CM | POA: Diagnosis not present

## 2017-09-16 DIAGNOSIS — Z5111 Encounter for antineoplastic chemotherapy: Secondary | ICD-10-CM | POA: Insufficient documentation

## 2017-09-16 DIAGNOSIS — G620 Drug-induced polyneuropathy: Secondary | ICD-10-CM

## 2017-09-16 DIAGNOSIS — E876 Hypokalemia: Secondary | ICD-10-CM

## 2017-09-16 DIAGNOSIS — T451X5A Adverse effect of antineoplastic and immunosuppressive drugs, initial encounter: Secondary | ICD-10-CM

## 2017-09-16 LAB — COMPREHENSIVE METABOLIC PANEL
ALK PHOS: 81 U/L (ref 38–126)
ALT: 28 U/L (ref 14–54)
AST: 29 U/L (ref 15–41)
Albumin: 3.3 g/dL — ABNORMAL LOW (ref 3.5–5.0)
Anion gap: 8 (ref 5–15)
BUN: 16 mg/dL (ref 6–20)
CALCIUM: 8.8 mg/dL — AB (ref 8.9–10.3)
CHLORIDE: 105 mmol/L (ref 101–111)
CO2: 27 mmol/L (ref 22–32)
CREATININE: 1.15 mg/dL — AB (ref 0.44–1.00)
GFR calc non Af Amer: 56 mL/min — ABNORMAL LOW (ref >60)
Glucose, Bld: 151 mg/dL — ABNORMAL HIGH (ref 65–99)
Potassium: 3.2 mmol/L — ABNORMAL LOW (ref 3.5–5.1)
SODIUM: 140 mmol/L (ref 135–145)
Total Bilirubin: 0.3 mg/dL (ref 0.3–1.2)
Total Protein: 6.7 g/dL (ref 6.5–8.1)

## 2017-09-16 LAB — CBC WITH DIFFERENTIAL/PLATELET
BASOS PCT: 1 %
Basophils Absolute: 0 10*3/uL (ref 0–0.1)
EOS ABS: 0 10*3/uL (ref 0–0.7)
EOS PCT: 1 %
HCT: 32.6 % — ABNORMAL LOW (ref 35.0–47.0)
HEMOGLOBIN: 10.7 g/dL — AB (ref 12.0–16.0)
LYMPHS ABS: 1.1 10*3/uL (ref 1.0–3.6)
Lymphocytes Relative: 16 %
MCH: 25.8 pg — AB (ref 26.0–34.0)
MCHC: 32.9 g/dL (ref 32.0–36.0)
MCV: 78.7 fL — ABNORMAL LOW (ref 80.0–100.0)
MONO ABS: 0.6 10*3/uL (ref 0.2–0.9)
MONOS PCT: 9 %
NEUTROS PCT: 73 %
Neutro Abs: 5.3 10*3/uL (ref 1.4–6.5)
PLATELETS: 253 10*3/uL (ref 150–440)
RBC: 4.14 MIL/uL (ref 3.80–5.20)
RDW: 21.5 % — ABNORMAL HIGH (ref 11.5–14.5)
WBC: 7.1 10*3/uL (ref 3.6–11.0)

## 2017-09-16 MED ORDER — PEGFILGRASTIM 6 MG/0.6ML ~~LOC~~ PSKT
6.0000 mg | PREFILLED_SYRINGE | Freq: Once | SUBCUTANEOUS | Status: AC
  Administered 2017-09-16: 6 mg via SUBCUTANEOUS
  Filled 2017-09-16: qty 0.6

## 2017-09-16 MED ORDER — SODIUM CHLORIDE 0.9 % IV SOLN
600.0000 mg/m2 | Freq: Once | INTRAVENOUS | Status: AC
  Administered 2017-09-16: 1660 mg via INTRAVENOUS
  Filled 2017-09-16: qty 83

## 2017-09-16 MED ORDER — PALONOSETRON HCL INJECTION 0.25 MG/5ML
0.2500 mg | Freq: Once | INTRAVENOUS | Status: AC
  Administered 2017-09-16: 0.25 mg via INTRAVENOUS
  Filled 2017-09-16: qty 5

## 2017-09-16 MED ORDER — SODIUM CHLORIDE 0.9 % IV SOLN
Freq: Once | INTRAVENOUS | Status: AC
  Administered 2017-09-16: 10:00:00 via INTRAVENOUS
  Filled 2017-09-16: qty 1000

## 2017-09-16 MED ORDER — SODIUM CHLORIDE 0.9 % IV SOLN
170.0000 mg | Freq: Once | INTRAVENOUS | Status: AC
  Administered 2017-09-16: 170 mg via INTRAVENOUS
  Filled 2017-09-16: qty 17

## 2017-09-16 MED ORDER — SODIUM CHLORIDE 0.9 % IV SOLN
20.0000 mg | Freq: Once | INTRAVENOUS | Status: AC
  Administered 2017-09-16: 20 mg via INTRAVENOUS
  Filled 2017-09-16: qty 2

## 2017-09-16 MED ORDER — GABAPENTIN 300 MG PO CAPS: 300.0000 mg | ORAL_CAPSULE | Freq: Two times a day (BID) | ORAL | 2 refills | Status: DC

## 2017-09-16 MED ORDER — DIPHENHYDRAMINE HCL 50 MG/ML IJ SOLN
50.0000 mg | Freq: Once | INTRAMUSCULAR | Status: AC
  Administered 2017-09-16: 50 mg via INTRAVENOUS
  Filled 2017-09-16: qty 1

## 2017-09-16 MED ORDER — FAMOTIDINE IN NACL 20-0.9 MG/50ML-% IV SOLN
20.0000 mg | Freq: Two times a day (BID) | INTRAVENOUS | Status: DC
  Administered 2017-09-16: 20 mg via INTRAVENOUS
  Filled 2017-09-16: qty 50

## 2017-09-16 NOTE — Progress Notes (Signed)
Here for follow up . Stated overall feeling ok except feeling " spacey " w Cymbalta -unclear if she wants to continue

## 2017-09-19 ENCOUNTER — Inpatient Hospital Stay: Payer: Medicaid Other

## 2017-09-19 VITALS — BP 128/80 | HR 76 | Temp 97.6°F | Resp 18

## 2017-09-19 DIAGNOSIS — Z170 Estrogen receptor positive status [ER+]: Principal | ICD-10-CM

## 2017-09-19 DIAGNOSIS — Z5111 Encounter for antineoplastic chemotherapy: Secondary | ICD-10-CM | POA: Diagnosis not present

## 2017-09-19 DIAGNOSIS — C50411 Malignant neoplasm of upper-outer quadrant of right female breast: Secondary | ICD-10-CM

## 2017-09-19 MED ORDER — ACETAMINOPHEN 325 MG PO TABS
650.0000 mg | ORAL_TABLET | Freq: Once | ORAL | Status: AC
  Administered 2017-09-19: 650 mg via ORAL
  Filled 2017-09-19: qty 2

## 2017-09-19 MED ORDER — SODIUM CHLORIDE 0.9 % IV SOLN
Freq: Once | INTRAVENOUS | Status: AC
  Administered 2017-09-19: 14:00:00 via INTRAVENOUS
  Filled 2017-09-19: qty 1000

## 2017-09-22 ENCOUNTER — Inpatient Hospital Stay: Payer: Medicaid Other

## 2017-09-22 ENCOUNTER — Ambulatory Visit (INDEPENDENT_AMBULATORY_CARE_PROVIDER_SITE_OTHER): Payer: Medicaid Other | Admitting: Surgery

## 2017-09-22 ENCOUNTER — Encounter: Payer: Self-pay | Admitting: Surgery

## 2017-09-22 VITALS — BP 123/72 | HR 116 | Temp 98.0°F | Ht 69.0 in | Wt 347.0 lb

## 2017-09-22 DIAGNOSIS — C50911 Malignant neoplasm of unspecified site of right female breast: Secondary | ICD-10-CM

## 2017-09-22 NOTE — Progress Notes (Signed)
Outpatient Surgical Follow Up  09/22/2017  Michelle Mcmillan is an 47 y.o. female.   CC: Breast cancer  HPI: This patient with breast cancer she has seen [name] and plastic surgery for reconstruction.  She also desires prophylactic mastectomy on the contralateral side.  Patient is just finished chemotherapy and is doing well.  Past Medical History:  Diagnosis Date  . Anemia    H/O  . Arthritis    RIGHT KNEE  . Breast cancer ([place]) 2017   right breast  . Cancer ([place])    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)  . GERD (gastroesophageal reflux disease)    NO MEDS  . Headache    migraines  . Hypertension   . Last menstrual period (LMP) > 10 days ago 08/2015  . Personal history of radiation therapy     Past Surgical History:  Procedure Laterality Date  . BREAST BIOPSY Right 05/08/2017   invasive mamm. ca  . BREAST EXCISIONAL BIOPSY Right 06/15/15   breast cancer  . BREAST LUMPECTOMY WITH NEEDLE LOCALIZATION Right 06/15/2015   Procedure: BREAST LUMPECTOMY WITH NEEDLE LOCALIZATION;  Surgeon: Hubbard Robinson, MD;  Location: [facility] ORS;  Service: General;  Laterality: Right;  . BREAST SURGERY    . CESAREAN SECTION  1988   x1  . COLONOSCOPY WITH PROPOFOL N/A 01/23/2017   Procedure: COLONOSCOPY WITH PROPOFOL;  Surgeon: Lin Landsman, MD;  Location: [facility] ENDOSCOPY;  Service: Gastroenterology;  Laterality: N/A;  . ESOPHAGOGASTRODUODENOSCOPY (EGD) WITH PROPOFOL N/A 01/23/2017   Procedure: ESOPHAGOGASTRODUODENOSCOPY (EGD) WITH PROPOFOL;  Surgeon: Lin Landsman, MD;  Location: [facility] ENDOSCOPY;  Service: Gastroenterology;  Laterality: N/A;  . MASS EXCISION N/A 05/20/2016   Procedure: EXCISION CHEST WALL MASS;  Surgeon: Florene Glen, MD;  Location: [facility] ORS;  Service: General;  Laterality: N/A;  . SENTINEL NODE BIOPSY Right 06/15/2015   Procedure: SENTINEL NODE BIOPSY;  Surgeon: Hubbard Robinson, MD;  Location: [facility] ORS;  Service: General;  Laterality: Right;  . TOTAL MASTECTOMY Right  05/22/2017   Procedure: TOTAL MASTECTOMY;  Surgeon: Florene Glen, MD;  Location: [facility] ORS;  Service: General;  Laterality: Right;  . TUBAL LIGATION  2004    Family History  Problem Relation Age of Onset  . Hypertension Mother   . Hypertension Father   . Hypertension Sister   . Breast cancer Neg Hx     Social History:  reports that she has never smoked. She has never used smokeless tobacco. She reports that she drinks alcohol. She reports that she does not use drugs.  Allergies:  Allergies  Allergen Reactions  . Nitroglycerin Other (See Comments)    Headache, n/v.    Medications reviewed.   Review of Systems:   Review of Systems  Constitutional: Negative.   HENT: Negative.   Eyes: Negative.   Respiratory: Negative.   Cardiovascular: Negative.   Gastrointestinal: Negative.   Genitourinary: Negative.   Musculoskeletal: Negative.   Skin: Negative.   Neurological: Negative.   Endo/Heme/Allergies: Negative.   Psychiatric/Behavioral: Negative.      Physical Exam:  There were no vitals taken for this visit.  Physical Exam  Constitutional: She is oriented to person, place, and time. She appears well-developed and well-nourished. No distress.  Alopecic Right arm in lymphedema sleeve  HENT:  Head: Normocephalic and atraumatic.  Eyes: Pupils are equal, round, and reactive to light. EOM are normal.  Neck: Normal range of motion. Neck supple.  Cardiovascular: Normal rate, regular rhythm and normal heart sounds.  Pulmonary/Chest: Effort  normal and breath sounds normal. No stridor. No respiratory distress.  Abdominal: Soft. Bowel sounds are normal. She exhibits no distension and no mass. There is no tenderness. There is no guarding.  Musculoskeletal: Normal range of motion. She exhibits no edema.  Neurological: She is alert and oriented to person, place, and time.  Skin: Skin is warm and dry. She is not diaphoretic. No erythema. No pallor.  Psychiatric: She has a  normal mood and affect. Her behavior is normal.  Vitals reviewed.  No mass in the left breast.  Right mastectomy site is well-healed no erythema no drainage   No results found for this or any previous visit (from the past 48 hour(s)). No results found.  Assessment/Plan:  Patient has just finished her chemotherapy.  It is her last chemotherapy.  She wants to talk about prophylactic left mastectomy and reconstruction bilaterally.  She has seen plastic surgery for this.  I would think that [name] would recommend waiting several weeks for the effects of the chemotherapy with respect to infection and wound healing to diminish.  I discussed with her the risks of bleeding and infection and the low risk of lymphedema as we would not be entering the axilla to any degree.  Since she has no cancer on the left there is no reason to perform a sentinel node etc.  She understood and agreed with this plan and wants to discuss possible surgery with Dr. Tedra Coupe him in August.  Florene Glen, MD, FACS

## 2017-09-22 NOTE — Patient Instructions (Signed)
Please see your follow up appointment listed below.  °

## 2017-09-24 ENCOUNTER — Ambulatory Visit: Payer: Medicaid Other

## 2017-10-20 NOTE — Progress Notes (Signed)
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   INTERVAL HISTORY: Patient returns to clinic today for further evaluation and laboratory work.  She continues to have pain in her right arm as well as bilateral legs, but states this is improving. She continues to have peripheral neuropathy.  She otherwise feels well.  She has no neurologic complaints. She denies any recent fevers or illnesses. She has a good appetite and denies weight loss. She has no chest pain or shortness of breath.  She denies any nausea, vomiting, constipation, or diarrhea.  She has no urinary complaints.  Patient otherwise feels well and offers no further specific complaints.  REVIEW OF SYSTEMS:   Review of Systems  Constitutional: Negative.  Negative for fever, malaise/fatigue and weight loss.  Respiratory: Negative.  Negative for cough and shortness of breath.   Cardiovascular: Negative.  Negative for chest pain and leg swelling.  Gastrointestinal: Negative.  Negative for abdominal pain, constipation, diarrhea, nausea and vomiting.  Genitourinary: Negative.  Negative for dysuria.  Musculoskeletal: Positive for joint pain. Negative for myalgias.  Skin: Negative.  Negative for rash.  Neurological: Positive for tingling and sensory change. Negative for focal weakness and weakness.  Psychiatric/Behavioral: The patient has insomnia. The patient is not nervous/anxious.     As per HPI. Otherwise, a complete review of systems is negative.  PAST MEDICAL HISTORY: Past Medical History:  Diagnosis Date  . Anemia     H/O  . Arthritis    RIGHT KNEE  . Breast cancer ([place]) 2017   right breast  . Cancer ([place])    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)  . GERD (gastroesophageal reflux disease)    NO MEDS  . Headache    migraines  . Hypertension   . Last menstrual period (LMP) > 10 days ago 08/2015  . Personal history of radiation therapy     PAST SURGICAL HISTORY: Past Surgical History:  Procedure Laterality Date  . BREAST BIOPSY Right 05/08/2017   invasive mamm. ca  . BREAST EXCISIONAL BIOPSY Right 06/15/15   breast cancer  . BREAST LUMPECTOMY WITH NEEDLE LOCALIZATION Right 06/15/2015   Procedure: BREAST LUMPECTOMY WITH NEEDLE LOCALIZATION;  Surgeon: Hubbard Robinson, MD;  Location: [facility] ORS;  Service: General;  Laterality: Right;  . BREAST SURGERY    . CESAREAN SECTION  1988   x1  . COLONOSCOPY WITH PROPOFOL N/A 01/23/2017   Procedure: COLONOSCOPY WITH PROPOFOL;  Surgeon: Lin Landsman, MD;  Location: [facility] ENDOSCOPY;  Service: Gastroenterology;  Laterality: N/A;  . ESOPHAGOGASTRODUODENOSCOPY (EGD) WITH PROPOFOL N/A 01/23/2017   Procedure: ESOPHAGOGASTRODUODENOSCOPY (EGD) WITH PROPOFOL;  Surgeon: Lin Landsman, MD;  Location: [facility] ENDOSCOPY;  Service: Gastroenterology;  Laterality: N/A;  . MASS EXCISION N/A 05/20/2016   Procedure: EXCISION CHEST WALL MASS;  Surgeon: Florene Glen, MD;  Location: [facility] ORS;  Service: General;  Laterality: N/A;  . SENTINEL NODE BIOPSY Right 06/15/2015   Procedure: SENTINEL NODE BIOPSY;  Surgeon: Hubbard Robinson, MD;  Location: [facility] ORS;  Service: General;  Laterality: Right;  . TOTAL MASTECTOMY Right 05/22/2017   Procedure: TOTAL MASTECTOMY;  Surgeon: Florene Glen, MD;  Location: [facility] ORS;  Service: General;  Laterality: Right;  . TUBAL LIGATION  2004    FAMILY HISTORY Family History  Problem Relation Age of Onset  . Hypertension Mother   . Hypertension Father   . Hypertension Sister   . Breast cancer Neg Hx        ADVANCED DIRECTIVES:     HEALTH MAINTENANCE: Social History   Tobacco Use  . Smoking status: Never Smoker  . Smokeless tobacco: Never Used  Substance Use Topics  . Alcohol use: Yes    Comment: WINE ONCE MONTH  . Drug use: No    Allergies  Allergen Reactions  . Nitroglycerin Other (See Comments)    Headache, n/v.    Current Outpatient Medications  Medication Sig Dispense Refill  . Calcium Carb-Cholecalciferol (CALCIUM-VITAMIN D) 500-200 MG-UNIT tablet Take 1 tablet by mouth 2 (two) times daily.     . diphenoxylate-atropine (LOMOTIL) 2.5-0.025 MG tablet Take 1 tablet by mouth 4 (four) times daily as needed for diarrhea or loose stools. 30 tablet 0  . Ferrous Sulfate (IRON) 325 (65 Fe) MG TABS Take 1 tablet (325 mg total) by mouth 2 (two) times daily. 60 each 2  . gabapentin (NEURONTIN) 300 MG capsule Take 1 capsule (300 mg total) by mouth 2 (two) times daily. 60 capsule 2  . hydrochlorothiazide (HYDRODIURIL) 25 MG tablet Take 25 mg by mouth daily.    . Liniments (BLUE-EMU SUPER STRENGTH EX) Apply 1 application topically daily as needed (for pain).    Marland Kitchen omeprazole (PRILOSEC) 20 MG capsule Take 1 capsule (20 mg total) by mouth daily. 30 capsule 3  . potassium chloride SA (K-DUR,KLOR-CON) 20 MEQ tablet Take 1 tablet (20 mEq total) by mouth 2 (two) times daily. 30 tablet 1  . tetrahydrozoline 0.05 % ophthalmic solution Place 2 drops into both eyes daily.    Marland Kitchen HYDROcodone-acetaminophen (NORCO/VICODIN) 5-325 MG tablet Take 1-2 tablets by mouth every 4 (four) hours as needed for moderate pain. (Patient not taking: Reported on 10/21/2017) 30 tablet 0  . letrozole (FEMARA) 2.5 MG tablet Take 1 tablet (2.5 mg total) by mouth daily. 30 tablet 3  . ondansetron (ZOFRAN) 8 MG tablet Take 1 tablet (8 mg total) by mouth 2 (two) times daily as needed for refractory nausea / vomiting. (Patient not taking: Reported on 10/21/2017) 30 tablet 2  . prochlorperazine (COMPAZINE) 10 MG tablet Take 1 tablet (10 mg total) by mouth  every 6 (six) hours as needed (Nausea or vomiting). (Patient not taking: Reported on 10/21/2017) 60 tablet 2   No current facility-administered medications for this visit.     OBJECTIVE: Vitals:   10/21/17 1017  BP: (!) 150/95  Pulse: 87  Resp: 18  Temp: (!) 95.8 F (35.4 C)     Body mass index is 52.16 kg/m.    ECOG FS:0 - Asymptomatic  General: Well-developed, well-nourished, no acute distress. Eyes: Pink conjunctiva, anicteric sclera. HEENT: Normocephalic, moist mucous membranes, clear oropharnyx. Breast: Right mastectomy.  Exam deferred today. Lungs: Clear to auscultation bilaterally. Heart: Regular rate and rhythm. No rubs, murmurs, or gallops. Abdomen: Soft, nontender, nondistended. No organomegaly noted, normoactive bowel sounds. Musculoskeletal: No edema, cyanosis, or clubbing. Neuro: Alert, answering all questions appropriately. Cranial nerves grossly intact. Skin: No rashes or petechiae noted. Psych: Normal affect.  LAB RESULTS:  Lab Results  Component Value Date   NA 140 10/21/2017   K 3.4 (L) 10/21/2017   CL 103 10/21/2017   CO2 26 10/21/2017   GLUCOSE 140 (  H) 10/21/2017   BUN 14 10/21/2017   CREATININE 1.08 (H) 10/21/2017   CALCIUM 8.7 (L) 10/21/2017   PROT 6.9 10/21/2017   ALBUMIN 3.4 (L) 10/21/2017   AST 37 10/21/2017   ALT 48 (H) 10/21/2017   ALKPHOS 79 10/21/2017   BILITOT 0.4 10/21/2017   GFRNONAA >60 10/21/2017   GFRAA >60 10/21/2017    Lab Results  Component Value Date   WBC 5.3 10/21/2017   NEUTROABS 3.7 10/21/2017   HGB 10.7 (L) 10/21/2017   HCT 33.3 (L) 10/21/2017   MCV 80.3 10/21/2017   PLT 272 10/21/2017     STUDIES: No results found.  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 BRCA 1 and 2 negative, Oncotype DX score 17 which is considered low risk.  PLAN:    1. Pathologic stage Ia ER/PR positive, HER-2 negative adenocarcinoma of the upper  outer quadrant of the right breast: Pathology from mastectomy specimen on May 22, 2017 indicates that this is most likely a second primary and not a local recurrence of disease.  This occurred while patient was taking Aromasin.  Oncotype was not sent on her second breast cancer.  CT scans and bone scans reviewed independently with no evidence of metastatic disease confirming stage I malignancy.  Patient completed cycle 5 of Taxotere and Cytoxan on September 16, 2017.  Patient received 1 additional cycle secondary to a reaction she had during cycle 1 and did not complete treatment at that time.  Patient was given a prescription for letrozole today.  She is also instructed to keep her follow-up appointment with surgery on October 27, 2017.  Will get a baseline bone mineral density in the next 1 to 2 weeks.  Return to clinic in 3 months for further evaluation.   2. Genetic testing: Patient was found to be BRCA 1 and 2 negative. She expressed understanding that although her genetic testing was negative, her children are at higher risk for breast cancer than the general population.   3. Bone mineral density: Repeat bone mineral density as above prior to starting letrozole. 4.  Hypokalemia: Potassium only mildly decreased at 3.4.  Continue oral potassium supplementation. 5.  Diarrhea: Resolved.  Continue Imodium as needed. 6.  Right arm pain/swelling: Continue treatment per lymphedema clinic. 7.  Neuropathy: Continue gabapentin as prescribed. 8.  Anemia: Chronic and unchanged.  Monitor.   Patient expressed understanding and was in agreement with this plan. She also understands that She can call clinic at any time with any questions, concerns, or complaints.    Lloyd Huger, MD 10/24/17 11:09 AM

## 2017-10-21 ENCOUNTER — Other Ambulatory Visit: Payer: Self-pay

## 2017-10-21 ENCOUNTER — Inpatient Hospital Stay: Payer: Medicaid Other | Attending: Oncology

## 2017-10-21 ENCOUNTER — Inpatient Hospital Stay (HOSPITAL_BASED_OUTPATIENT_CLINIC_OR_DEPARTMENT_OTHER): Payer: Medicaid Other | Admitting: Oncology

## 2017-10-21 VITALS — BP 150/95 | HR 87 | Temp 95.8°F | Resp 18 | Wt 353.2 lb

## 2017-10-21 DIAGNOSIS — I10 Essential (primary) hypertension: Secondary | ICD-10-CM | POA: Diagnosis not present

## 2017-10-21 DIAGNOSIS — E876 Hypokalemia: Secondary | ICD-10-CM | POA: Insufficient documentation

## 2017-10-21 DIAGNOSIS — C50411 Malignant neoplasm of upper-outer quadrant of right female breast: Secondary | ICD-10-CM | POA: Diagnosis present

## 2017-10-21 DIAGNOSIS — G629 Polyneuropathy, unspecified: Secondary | ICD-10-CM | POA: Insufficient documentation

## 2017-10-21 DIAGNOSIS — M79601 Pain in right arm: Secondary | ICD-10-CM

## 2017-10-21 DIAGNOSIS — M7989 Other specified soft tissue disorders: Secondary | ICD-10-CM

## 2017-10-21 DIAGNOSIS — D649 Anemia, unspecified: Secondary | ICD-10-CM | POA: Diagnosis not present

## 2017-10-21 DIAGNOSIS — Z170 Estrogen receptor positive status [ER+]: Secondary | ICD-10-CM | POA: Insufficient documentation

## 2017-10-21 LAB — COMPREHENSIVE METABOLIC PANEL
ALK PHOS: 79 U/L (ref 38–126)
ALT: 48 U/L — AB (ref 0–44)
ANION GAP: 11 (ref 5–15)
AST: 37 U/L (ref 15–41)
Albumin: 3.4 g/dL — ABNORMAL LOW (ref 3.5–5.0)
BILIRUBIN TOTAL: 0.4 mg/dL (ref 0.3–1.2)
BUN: 14 mg/dL (ref 6–20)
CALCIUM: 8.7 mg/dL — AB (ref 8.9–10.3)
CO2: 26 mmol/L (ref 22–32)
CREATININE: 1.08 mg/dL — AB (ref 0.44–1.00)
Chloride: 103 mmol/L (ref 98–111)
Glucose, Bld: 140 mg/dL — ABNORMAL HIGH (ref 70–99)
Potassium: 3.4 mmol/L — ABNORMAL LOW (ref 3.5–5.1)
Sodium: 140 mmol/L (ref 135–145)
TOTAL PROTEIN: 6.9 g/dL (ref 6.5–8.1)

## 2017-10-21 LAB — CBC WITH DIFFERENTIAL/PLATELET
Basophils Absolute: 0 10*3/uL (ref 0–0.1)
Basophils Relative: 1 %
Eosinophils Absolute: 0.2 10*3/uL (ref 0–0.7)
Eosinophils Relative: 3 %
HCT: 33.3 % — ABNORMAL LOW (ref 35.0–47.0)
Hemoglobin: 10.7 g/dL — ABNORMAL LOW (ref 12.0–16.0)
Lymphocytes Relative: 20 %
Lymphs Abs: 1 10*3/uL (ref 1.0–3.6)
MCH: 25.9 pg — ABNORMAL LOW (ref 26.0–34.0)
MCHC: 32.3 g/dL (ref 32.0–36.0)
MCV: 80.3 fL (ref 80.0–100.0)
Monocytes Absolute: 0.4 10*3/uL (ref 0.2–0.9)
Monocytes Relative: 7 %
Neutro Abs: 3.7 10*3/uL (ref 1.4–6.5)
Neutrophils Relative %: 69 %
Platelets: 272 10*3/uL (ref 150–440)
RBC: 4.14 MIL/uL (ref 3.80–5.20)
RDW: 22.4 % — ABNORMAL HIGH (ref 11.5–14.5)
WBC: 5.3 10*3/uL (ref 3.6–11.0)

## 2017-10-21 MED ORDER — LETROZOLE 2.5 MG PO TABS: 2.5000 mg | ORAL_TABLET | Freq: Every day | ORAL | 3 refills | Status: DC

## 2017-10-21 NOTE — Progress Notes (Signed)
Here for follow up. Pain in R arm-level 5 -constant per pt -dull ,sharp ,tingling.  Pain in both legs " in the bone and joints " on going per pt- has swelling in legs ,arm and feet.  Intermittent SOB she stated.pulse ox- 97% on RA  Pt in nad

## 2017-10-27 ENCOUNTER — Encounter: Payer: Self-pay | Admitting: Surgery

## 2017-10-27 ENCOUNTER — Ambulatory Visit (INDEPENDENT_AMBULATORY_CARE_PROVIDER_SITE_OTHER): Payer: Medicaid Other | Admitting: Surgery

## 2017-10-27 VITALS — BP 139/84 | HR 83 | Ht 69.0 in | Wt 351.0 lb

## 2017-10-27 DIAGNOSIS — C50911 Malignant neoplasm of unspecified site of right female breast: Secondary | ICD-10-CM

## 2017-10-27 NOTE — Progress Notes (Signed)
Outpatient Surgical Follow Up  10/27/2017  Michelle Mcmillan is an 47 y.o. female.   CC: Breast cancer  HPI: This patient with breast cancer is completed her chemotherapy and desires prophylactic mastectomy and reconstruction.  She has had a right mastectomy with radiation.  She has seen Dr. Audelia Hives and they have discussed apparently prophylactic mastectomy and bilateral reconstruction.  Past Medical History:  Diagnosis Date  . Anemia    H/O  . Arthritis    RIGHT KNEE  . Breast cancer ([place]) 2017   right breast  . Cancer ([place])    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)  . GERD (gastroesophageal reflux disease)    NO MEDS  . Headache    migraines  . Hypertension   . Last menstrual period (LMP) > 10 days ago 08/2015  . Personal history of radiation therapy     Past Surgical History:  Procedure Laterality Date  . BREAST BIOPSY Right 05/08/2017   invasive mamm. ca  . BREAST EXCISIONAL BIOPSY Right 06/15/15   breast cancer  . BREAST LUMPECTOMY WITH NEEDLE LOCALIZATION Right 06/15/2015   Procedure: BREAST LUMPECTOMY WITH NEEDLE LOCALIZATION;  Surgeon: Hubbard Robinson, MD;  Location: [facility] ORS;  Service: General;  Laterality: Right;  . BREAST SURGERY    . CESAREAN SECTION  1988   x1  . COLONOSCOPY WITH PROPOFOL N/A 01/23/2017   Procedure: COLONOSCOPY WITH PROPOFOL;  Surgeon: Lin Landsman, MD;  Location: [facility] ENDOSCOPY;  Service: Gastroenterology;  Laterality: N/A;  . ESOPHAGOGASTRODUODENOSCOPY (EGD) WITH PROPOFOL N/A 01/23/2017   Procedure: ESOPHAGOGASTRODUODENOSCOPY (EGD) WITH PROPOFOL;  Surgeon: Lin Landsman, MD;  Location: [facility] ENDOSCOPY;  Service: Gastroenterology;  Laterality: N/A;  . MASS EXCISION N/A 05/20/2016   Procedure: EXCISION CHEST WALL MASS;  Surgeon: Florene Glen, MD;  Location: [facility] ORS;  Service: General;  Laterality: N/A;  . SENTINEL NODE BIOPSY Right 06/15/2015   Procedure: SENTINEL NODE BIOPSY;  Surgeon: Hubbard Robinson, MD;  Location: [facility] ORS;   Service: General;  Laterality: Right;  . TOTAL MASTECTOMY Right 05/22/2017   Procedure: TOTAL MASTECTOMY;  Surgeon: Florene Glen, MD;  Location: [facility] ORS;  Service: General;  Laterality: Right;  . TUBAL LIGATION  2004    Family History  Problem Relation Age of Onset  . Hypertension Mother   . Hypertension Father   . Hypertension Sister   . Breast cancer Neg Hx     Social History:  reports that she has never smoked. She has never used smokeless tobacco. She reports that she drinks alcohol. She reports that she does not use drugs.  Allergies:  Allergies  Allergen Reactions  . Nitroglycerin Other (See Comments)    Headache, n/v.    Medications reviewed.   Review of Systems:   Review of Systems  Constitutional: Negative.   HENT: Negative.   Eyes: Negative.   Respiratory: Negative.   Cardiovascular: Negative.   Gastrointestinal: Negative.   Genitourinary: Negative.   Musculoskeletal: Negative.   Skin: Negative.   Neurological: Negative.   Endo/Heme/Allergies: Negative.   Psychiatric/Behavioral: Negative.      Physical Exam:  There were no vitals taken for this visit.  Physical Exam  Constitutional: She appears well-developed and well-nourished. No distress.  Alopecic  HENT:  Head: Normocephalic and atraumatic.  Eyes: Pupils are equal, round, and reactive to light. Right eye exhibits no discharge. Left eye exhibits no discharge. No scleral icterus.  Neck: Normal range of motion. Neck supple.  Cardiovascular: Normal rate, regular rhythm and normal heart sounds.  Pulmonary/Chest: Effort normal and breath sounds normal. No stridor. No respiratory distress.  Abdominal: Soft. She exhibits no distension. There is no tenderness.  Musculoskeletal: Normal range of motion. She exhibits no edema or deformity.  Lymphadenopathy:    She has no cervical adenopathy.  Neurological: She is alert.  Skin: Skin is warm and dry. No rash noted. She is not diaphoretic. No  erythema.  Vitals reviewed.  Breast exam: Right side with surgically absent breast with minimal thickening of the breast wall secondary to radiation with pigment changes.  No mass in the right breast no axillary adenopathy.  Nontender calves   No results found for this or any previous visit (from the past 25 hour(s)). No results found.  Assessment/Plan:  This patient with breast cancer who is requesting bilateral mastectomy with reconstruction.  Patient has not yet heard from [name] concerning surgical scheduling.  We will assist in expediting and planning for August if possible. Florene Glen, MD, FACS

## 2017-10-29 ENCOUNTER — Telehealth: Payer: Self-pay | Admitting: Surgery

## 2017-10-29 NOTE — Telephone Encounter (Signed)
I have called patient to discuss request from Dr Marla Roe and Dr Burt Knack. No answer. I have left a message on patient's vm for her to call back.   They would like to schedule surgery in October when Dr Marla Roe is back from vacation. She would like patient to be office of radiation and chemo due to the healing process of reconstruction. Dr Marla Roe has discussed this with Dr Burt Knack.   Patient will need to schedule an office visit with Dr Burt Knack towards the end of September to go over additional information about surgery.

## 2017-10-30 NOTE — Telephone Encounter (Signed)
Patient called returning your call this morning & also had some questions. Please call patient and advise.

## 2017-11-03 NOTE — Telephone Encounter (Signed)
Please call patient and schedule her to see Dr Burt Knack this week or the following week when he is in office per his request. He would like to discuss surgery date options and the conversation that he had with Dr Marla Roe.

## 2017-11-05 NOTE — Telephone Encounter (Signed)
Patient has called back and appointment has been made to see Dr Burt Knack on 11/17/17.

## 2017-11-12 ENCOUNTER — Ambulatory Visit
Admission: RE | Admit: 2017-11-12 | Discharge: 2017-11-12 | Disposition: A | Discharge status: Home / Self Care | Payer: Medicaid Other | Source: Ambulatory Visit | Attending: Oncology | Admitting: Oncology

## 2017-11-12 ENCOUNTER — Encounter: Payer: Self-pay | Admitting: Radiology

## 2017-11-12 DIAGNOSIS — C50411 Malignant neoplasm of upper-outer quadrant of right female breast: Secondary | ICD-10-CM | POA: Diagnosis present

## 2017-11-12 DIAGNOSIS — Z170 Estrogen receptor positive status [ER+]: Secondary | ICD-10-CM | POA: Diagnosis present

## 2017-11-17 ENCOUNTER — Ambulatory Visit: Payer: Medicaid Other | Admitting: Surgery

## 2017-11-17 ENCOUNTER — Encounter: Payer: Self-pay | Admitting: Surgery

## 2017-11-17 VITALS — Temp 98.3°F | Wt 345.0 lb

## 2017-11-17 DIAGNOSIS — C50911 Malignant neoplasm of unspecified site of right female breast: Secondary | ICD-10-CM | POA: Diagnosis not present

## 2017-11-17 NOTE — Progress Notes (Signed)
Outpatient Surgical Follow Up  11/17/2017  Michelle Mcmillan is an 47 y.o. female.   CC: Breast cancer  HPI: This patient with breast cancer on the right.  She has had treatment to the right side and now desires prophylactic left mastectomy and reconstruction.  She has seen Dr. Audelia Hives and surgery will be scheduled sometime in October.  Past Medical History:  Diagnosis Date  . Anemia    H/O  . Arthritis    RIGHT KNEE  . Breast cancer ([place]) 2017   right breast  . Cancer ([place])    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)  . GERD (gastroesophageal reflux disease)    NO MEDS  . Headache    migraines  . Hypertension   . Last menstrual period (LMP) > 10 days ago 08/2015  . Personal history of radiation therapy     Past Surgical History:  Procedure Laterality Date  . BREAST BIOPSY Right 05/08/2017   invasive mamm. ca  . BREAST EXCISIONAL BIOPSY Right 06/15/15   breast cancer  . BREAST LUMPECTOMY WITH NEEDLE LOCALIZATION Right 06/15/2015   Procedure: BREAST LUMPECTOMY WITH NEEDLE LOCALIZATION;  Surgeon: Hubbard Robinson, MD;  Location: [facility] ORS;  Service: General;  Laterality: Right;  . BREAST SURGERY    . CESAREAN SECTION  1988   x1  . COLONOSCOPY WITH PROPOFOL N/A 01/23/2017   Procedure: COLONOSCOPY WITH PROPOFOL;  Surgeon: Lin Landsman, MD;  Location: [facility] ENDOSCOPY;  Service: Gastroenterology;  Laterality: N/A;  . ESOPHAGOGASTRODUODENOSCOPY (EGD) WITH PROPOFOL N/A 01/23/2017   Procedure: ESOPHAGOGASTRODUODENOSCOPY (EGD) WITH PROPOFOL;  Surgeon: Lin Landsman, MD;  Location: [facility] ENDOSCOPY;  Service: Gastroenterology;  Laterality: N/A;  . MASS EXCISION N/A 05/20/2016   Procedure: EXCISION CHEST WALL MASS;  Surgeon: Florene Glen, MD;  Location: [facility] ORS;  Service: General;  Laterality: N/A;  . SENTINEL NODE BIOPSY Right 06/15/2015   Procedure: SENTINEL NODE BIOPSY;  Surgeon: Hubbard Robinson, MD;  Location: [facility] ORS;  Service: General;  Laterality: Right;  . TOTAL  MASTECTOMY Right 05/22/2017   Procedure: TOTAL MASTECTOMY;  Surgeon: Florene Glen, MD;  Location: [facility] ORS;  Service: General;  Laterality: Right;  . TUBAL LIGATION  2004    Family History  Problem Relation Age of Onset  . Hypertension Mother   . Hypertension Father   . Hypertension Sister   . Breast cancer Neg Hx     Social History:  reports that she has never smoked. She has never used smokeless tobacco. She reports that she drinks alcohol. She reports that she does not use drugs.  Allergies:  Allergies  Allergen Reactions  . Nitroglycerin Other (See Comments)    Headache, n/v.    Medications reviewed.   Review of Systems:   Review of Systems  All other systems reviewed and are negative.    Physical Exam:  There were no vitals taken for this visit.  Physical Exam  Constitutional: She is oriented to person, place, and time. She appears well-developed and well-nourished. No distress.  HENT:  Head: Normocephalic and atraumatic.  Pulmonary/Chest: Effort normal. No respiratory distress.  Musculoskeletal: Normal range of motion. She exhibits edema.  Patient wears a lymphedema stocking on the right arm  Neurological: She is alert and oriented to person, place, and time.  Skin: Skin is warm and dry. She is not diaphoretic. No erythema.  Vitals reviewed.  Breast exam demonstrates a right mastectomy with radiation changes.  Small medial seroma present but nontender and no erythema Left breast without  masses.   No results found for this or any previous visit (from the past 48 hour(s)). No results found.  Assessment/Plan:  Patient desires prophylactic mastectomy on the left and reconstruction.  Patient has seen plastic surgery in [facility] but Dr. Eusebio Friendly office is in the process of moving locations.  And with my departure from [place] group [name] and I discussed arranging this patient to have her surgery performed in conjunction with  plastic surgery and [facility] in [place] for continued care.  Was discussed at length with the patient.Dr Marlou Starks was chosen by Dr. Tedra Coupe him and I will call him personally to discuss this patient's care.  Patient and husband understood this and agreed to this plan. I left a message for Dr. Orson Gear at his office and he will return my call at his convenience.  Florene Glen, MD, FACS

## 2017-11-17 NOTE — Patient Instructions (Signed)
We will send a referral to Dr. Marlou Starks so he they could call you and schedule you an appointment so [name] and Dr. Marlou Starks could agree on a date to have your surgery done.

## 2017-12-23 ENCOUNTER — Encounter (HOSPITAL_COMMUNITY): Payer: Self-pay

## 2017-12-23 NOTE — Progress Notes (Signed)
Social worker provided counseling services to patient at the cancer center. 

## 2017-12-30 ENCOUNTER — Encounter (HOSPITAL_COMMUNITY): Payer: Self-pay

## 2017-12-30 NOTE — Progress Notes (Signed)
Social worker provided counseling services to patient at the cancer center. Next appointment on 01/06/2018.

## 2018-01-02 ENCOUNTER — Ambulatory Visit (INDEPENDENT_AMBULATORY_CARE_PROVIDER_SITE_OTHER): Payer: Medicaid Other | Admitting: Plastic Surgery

## 2018-01-02 ENCOUNTER — Encounter: Payer: Self-pay | Admitting: Plastic Surgery

## 2018-01-02 VITALS — BP 130/80 | HR 70 | Ht 69.0 in | Wt 338.0 lb

## 2018-01-02 DIAGNOSIS — C50911 Malignant neoplasm of unspecified site of right female breast: Secondary | ICD-10-CM | POA: Diagnosis not present

## 2018-01-02 DIAGNOSIS — N6489 Other specified disorders of breast: Secondary | ICD-10-CM

## 2018-01-02 DIAGNOSIS — C50411 Malignant neoplasm of upper-outer quadrant of right female breast: Secondary | ICD-10-CM

## 2018-01-02 DIAGNOSIS — Z9011 Acquired absence of right breast and nipple: Secondary | ICD-10-CM | POA: Diagnosis not present

## 2018-01-02 DIAGNOSIS — Z170 Estrogen receptor positive status [ER+]: Secondary | ICD-10-CM

## 2018-01-02 NOTE — Progress Notes (Signed)
Patient ID: Michelle Mcmillan, female    DOB: 18-Jul-1970, 47 y.o.   MRN: [id number]   Chief Complaint  Patient presents with  . Breast Problem    The patient is a 47 yrs old bf here with her husband for follow up and re-evaluation of her breast reconstruction.  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  She also had Right breast cancer in 2017 and was treated with a lumpectomy and radiation.  She wants to have a prophylactic left mastectomy due to her complicated history.  She was a 45 C / D cup.  She would like symmetry and is reasonable in her expectations.   Review of Systems  Constitutional: Negative.  Negative for activity change and appetite change.  HENT: Negative.   Eyes: Negative.   Respiratory: Negative.   Cardiovascular: Negative.  Negative for palpitations.  Gastrointestinal: Negative.   Endocrine: Negative.  Negative for cold intolerance.       Hot flashes  Genitourinary: Negative.   Skin: Negative.  Negative for color change, pallor, rash and wound.  Neurological: Negative.   Hematological: Negative.   Psychiatric/Behavioral: Negative.  Negative for behavioral problems.    Past Medical History:  Diagnosis Date  . Anemia    H/O  . Arthritis    RIGHT KNEE  . Breast cancer ([place]) 2017   right breast  . Cancer ([place])    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)  . GERD (gastroesophageal reflux disease)    NO MEDS  . Headache    migraines  . Hypertension   . Last menstrual period (LMP) > 10 days ago 08/2015  . Personal history of radiation therapy     Past Surgical History:  Procedure Laterality Date  . BREAST BIOPSY Right 05/08/2017   invasive mamm. ca  . BREAST EXCISIONAL BIOPSY Right 06/15/15   breast cancer  . BREAST LUMPECTOMY WITH NEEDLE LOCALIZATION Right 06/15/2015   Procedure: BREAST LUMPECTOMY WITH NEEDLE LOCALIZATION;  Surgeon: Hubbard Robinson, MD;  Location: [facility] ORS;   Service: General;  Laterality: Right;  . BREAST SURGERY    . CESAREAN SECTION  1988   x1  . COLONOSCOPY WITH PROPOFOL N/A 01/23/2017   Procedure: COLONOSCOPY WITH PROPOFOL;  Surgeon: Lin Landsman, MD;  Location: [facility] ENDOSCOPY;  Service: Gastroenterology;  Laterality: N/A;  . ESOPHAGOGASTRODUODENOSCOPY (EGD) WITH PROPOFOL N/A 01/23/2017   Procedure: ESOPHAGOGASTRODUODENOSCOPY (EGD) WITH PROPOFOL;  Surgeon: Lin Landsman, MD;  Location: [facility] ENDOSCOPY;  Service: Gastroenterology;  Laterality: N/A;  . MASS EXCISION N/A 05/20/2016   Procedure: EXCISION CHEST WALL MASS;  Surgeon: Florene Glen, MD;  Location: [facility] ORS;  Service: General;  Laterality: N/A;  . SENTINEL NODE BIOPSY Right 06/15/2015   Procedure: SENTINEL NODE BIOPSY;  Surgeon: Hubbard Robinson, MD;  Location: [facility] ORS;  Service: General;  Laterality: Right;  . TOTAL MASTECTOMY Right 05/22/2017   Procedure: TOTAL MASTECTOMY;  Surgeon: Florene Glen, MD;  Location: [facility] ORS;  Service: General;  Laterality: Right;  . TUBAL LIGATION  2004      Current Outpatient Medications:  .  Calcium Carb-Cholecalciferol (CALCIUM-VITAMIN D) 500-200 MG-UNIT tablet, Take 1 tablet by mouth 2 (two) times daily. , Disp: , Rfl:  .  diphenoxylate-atropine (LOMOTIL) 2.5-0.025 MG tablet, Take 1 tablet by mouth 4 (four) times daily as needed for diarrhea or loose stools., Disp: 30 tablet, Rfl: 0 .  DULoxetine (CYMBALTA) 30 MG capsule, TAKE  1 CAPSULE BY MOUTH ONCE DAILY FOR 7 DAYS THEN INCREASE TO 2 CAPSULES ONCE DAILY, Disp: , Rfl:  .  Ferrous Sulfate (IRON) 325 (65 Fe) MG TABS, Take 1 tablet (325 mg total) by mouth 2 (two) times daily., Disp: 60 each, Rfl: 2 .  gabapentin (NEURONTIN) 300 MG capsule, Take 1 capsule (300 mg total) by mouth 2 (two) times daily., Disp: 60 capsule, Rfl: 2 .  hydrochlorothiazide (HYDRODIURIL) 25 MG tablet, Take 25 mg by mouth daily., Disp: , Rfl:  .  HYDROcodone-acetaminophen (NORCO/VICODIN) 5-325 MG tablet, Take  1-2 tablets by mouth every 4 (four) hours as needed for moderate pain., Disp: 30 tablet, Rfl: 0 .  letrozole (FEMARA) 2.5 MG tablet, Take 1 tablet (2.5 mg total) by mouth daily., Disp: 30 tablet, Rfl: 3 .  Liniments (BLUE-EMU SUPER STRENGTH EX), Apply 1 application topically daily as needed (for pain)., Disp: , Rfl:  .  omeprazole (PRILOSEC) 20 MG capsule, Take 1 capsule (20 mg total) by mouth daily., Disp: 30 capsule, Rfl: 3 .  potassium chloride SA (K-DUR,KLOR-CON) 20 MEQ tablet, Take 1 tablet (20 mEq total) by mouth 2 (two) times daily., Disp: 30 tablet, Rfl: 1 .  prochlorperazine (COMPAZINE) 10 MG tablet, Take 1 tablet (10 mg total) by mouth every 6 (six) hours as needed (Nausea or vomiting)., Disp: 60 tablet, Rfl: 2 .  tetrahydrozoline 0.05 % ophthalmic solution, Place 2 drops into both eyes daily., Disp: , Rfl:    Objective:   Vitals:   01/02/18 0953  BP: 130/80  Pulse: 70    Physical Exam  Constitutional: She is oriented to person, place, and time. She appears well-developed and well-nourished.  HENT:  Head: Normocephalic and atraumatic.  Right Ear: External ear normal.  Left Ear: External ear normal.  Eyes: Pupils are equal, round, and reactive to light. EOM are normal.  Cardiovascular: Normal rate.  Pulmonary/Chest: Effort normal.  Abdominal: Soft. She exhibits no distension.  Neurological: She is alert and oriented to person, place, and time.  Skin: Skin is warm.  Psychiatric: She has a normal mood and affect. Her behavior is normal. Thought content normal.    Assessment & Plan:  Malignant neoplasm of upper-outer quadrant of right breast in female, estrogen receptor positive (HCC)  Malignant neoplasm of right breast in female, estrogen receptor positive, unspecified site of breast ([place])  Acquired absence of breast and absent nipple, right  Postoperative breast asymmetry  Assessment and Plan:  A long, detailed conversation was had regarding the patient's options for  breast reconstruction. Five main points, which are explained to all breast reconstruction patients, were discussed.  1. Breast reconstruction is an optional process.  2. Breast reconstruction is a multi-stage process which involves multiple surgeries spaced several months apart. The entire process can take over one year.  3. The major goal of breast reconstruction is to have the patient look normal in clothing. When naked, there will always be scars.  4. Asymmetries are often present during the reconstruction process. Several operations may be needed, including surgery to the non-cancerous breast, to achieve satisfactory results.  5. No matter the reconstructive method, there are ways that the reconstruction can fail and a secondary reconstructive plan would need to be created.   A general discussion regarding all available methods of breast reconstruction were discussed. The types of reconstructions described included.  1. Tissue expander and implant based reconstruction, both single and multi-stage approaches.  2. Autologous only reconstructions, including free abdominal-tissue based reconstructions.  3. Combination  procedures, particularly latissismus dorsi flaps combined with either expanders or implants.  For each of the reconstruction methods mentioned above, the risks, benefits, alternatives, scarring, and recovery time were discussed in great detail. Specific risks detailed included bleeding, infection, hematoma, seroma, scarring, pain, wound healing complications, flap loss, fat necrosis, capsular contracture, need for implant removal, donor site complications, bulge, hernia, umbilical necrosis, need for urgent reoperation, and need for dressing changes were discussed.   Assessment  Once all reconstruction options were presented, a focused discussion was had regarding the patient's suitability for each of these procedures.  A total of 50 minutes of face-to-face time was spent in this  encounter, of which >50% was spent in counseling.   Recommend bilateral expander placement at the time of the left mastectomy.  Then 2-3 months later the latissimus will be done on the right side.  Once fully expanded the expanders will be removed and the implants placed.  The patient understands the reasons for not being a good candidate for a TRAM.  [place], DO

## 2018-01-06 ENCOUNTER — Encounter (HOSPITAL_COMMUNITY): Payer: Self-pay

## 2018-01-06 NOTE — Progress Notes (Signed)
Social worker provided counseling services for patient at the cancer center.

## 2018-01-09 ENCOUNTER — Ambulatory Visit: Payer: Self-pay | Admitting: General Surgery

## 2018-01-13 ENCOUNTER — Encounter (HOSPITAL_COMMUNITY): Payer: Self-pay

## 2018-01-13 NOTE — Progress Notes (Signed)
Social worker provided services to patient at the cancer center.

## 2018-01-15 ENCOUNTER — Ambulatory Visit: Payer: Medicaid Other | Admitting: Oncology

## 2018-01-20 ENCOUNTER — Encounter (HOSPITAL_COMMUNITY): Payer: Self-pay

## 2018-01-20 NOTE — Progress Notes (Signed)
Social worker provided counseling services to patient at the cancer center. 

## 2018-01-25 NOTE — Progress Notes (Signed)
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   INTERVAL HISTORY: Patient returns to clinic today for routine 42-monthevaluation.  She is having significant hot flashes with her letrozole, but otherwise feels well.  She continues to have a mild peripheral neuropathy.  She has no other neurologic complaints. She denies any recent fevers or illnesses. She has a good appetite and denies weight loss. She has no chest pain or shortness of breath.  She denies any nausea, vomiting, constipation, or diarrhea.  She has no urinary complaints.  Patient offers no further specific complaints today.  REVIEW OF SYSTEMS:   Review of Systems  Constitutional: Negative.  Negative for fever, malaise/fatigue and weight loss.  Respiratory: Negative.  Negative for cough and shortness of breath.   Cardiovascular: Negative.  Negative for chest pain and leg swelling.  Gastrointestinal: Negative.  Negative for abdominal pain, constipation, diarrhea, nausea and vomiting.  Genitourinary: Negative.  Negative for dysuria.  Musculoskeletal: Negative.  Negative for joint pain and myalgias.  Skin: Negative.  Negative for rash.  Neurological: Positive for tingling and sensory change. Negative for focal weakness and weakness.  Psychiatric/Behavioral: The patient is not nervous/anxious and does not have insomnia.     As per HPI. Otherwise, a complete review of systems is negative.  PAST MEDICAL HISTORY: Past Medical History:  Diagnosis Date  . Anemia    H/O  . Arthritis    RIGHT KNEE  . [place]   [place]  . [place]    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)  . GERD (gastroesophageal reflux disease)    NO MEDS  . Headache    migraines  . Hypertension   . Last menstrual period (LMP) > 10 days ago 08/2015  . Personal history of radiation therapy     PAST SURGICAL HISTORY: Past Surgical History:  Procedure Laterality Date  . BREAST BIOPSY Right 05/08/2017   invasive mamm. ca  . BREAST EXCISIONAL BIOPSY Right 06/15/15   breast cancer  . BREAST LUMPECTOMY WITH NEEDLE LOCALIZATION Right 06/15/2015   Procedure: BREAST LUMPECTOMY WITH NEEDLE LOCALIZATION;  Surgeon: CHubbard Robinson MD;  Location: [facility] ORS;  Service: General;  Laterality: Right;  . BREAST SURGERY    . CESAREAN SECTION  1988   x1  . COLONOSCOPY WITH PROPOFOL N/A 01/23/2017   Procedure: COLONOSCOPY WITH PROPOFOL;  Surgeon: VLin Landsman MD;  [facility]  Service: Gastroenterology;  Laterality: N/A;  . ESOPHAGOGASTRODUODENOSCOPY (EGD) WITH PROPOFOL N/A 01/23/2017   Procedure: ESOPHAGOGASTRODUODENOSCOPY (EGD) WITH PROPOFOL;  Surgeon: VLin Landsman MD;  [facility];  Service: Gastroenterology;  Laterality: N/A;  . MASS EXCISION N/A 05/20/2016   Procedure: EXCISION CHEST WALL MASS;  Surgeon: RFlorene Glen MD;  Location: [facility] ORS;  Service: General;  Laterality: N/A;  . SENTINEL NODE BIOPSY Right 06/15/2015   Procedure: SENTINEL NODE BIOPSY;  Surgeon: CHubbard Robinson MD;  Location: [facility] ORS;  Service: General;  Laterality: Right;  . TOTAL MASTECTOMY Right 05/22/2017   Procedure: TOTAL MASTECTOMY;  Surgeon: CFlorene Glen MD;  Location: [facility] ORS;  Service: General;  Laterality: Right;  .  TUBAL LIGATION  2004    FAMILY HISTORY Family History  Problem Relation Age of Onset  . Hypertension Mother   . Hypertension Father   . Hypertension Sister   . Breast cancer Neg Hx        ADVANCED DIRECTIVES:    HEALTH MAINTENANCE: Social History   Tobacco Use  .  Smoking status: Never Smoker  . Smokeless tobacco: Never Used  Substance Use Topics  . Alcohol use: Yes    Comment: WINE ONCE MONTH  . Drug use: No    Allergies  Allergen Reactions  . Nitroglycerin Other (See Comments)    Headache, n/v.    Current Outpatient Medications  Medication Sig Dispense Refill  . Calcium Carb-Cholecalciferol (CALCIUM-VITAMIN D) 500-200 MG-UNIT tablet Take 1 tablet by mouth 2 (two) times daily.     . Ferrous Sulfate (IRON) 325 (65 Fe) MG TABS Take 1 tablet (325 mg total) by mouth 2 (two) times daily. 60 each 2  . gabapentin (NEURONTIN) 300 MG capsule Take 1 capsule (300 mg total) by mouth 2 (two) times daily. 60 capsule 2  . hydrochlorothiazide (HYDRODIURIL) 25 MG tablet Take 25 mg by mouth daily.    Marland Kitchen letrozole (FEMARA) 2.5 MG tablet Take 1 tablet (2.5 mg total) by mouth daily. 30 tablet 3  . omeprazole (PRILOSEC) 20 MG capsule Take 1 capsule (20 mg total) by mouth daily. 30 capsule 3  . potassium chloride SA (K-DUR,KLOR-CON) 20 MEQ tablet Take 1 tablet (20 mEq total) by mouth 2 (two) times daily. 30 tablet 1  . tetrahydrozoline 0.05 % ophthalmic solution Place 2 drops into both eyes daily.    . Liniments (BLUE-EMU SUPER STRENGTH EX) Apply 1 application topically daily as needed (for pain).    . prochlorperazine (COMPAZINE) 10 MG tablet Take 1 tablet (10 mg total) by mouth every 6 (six) hours as needed (Nausea or vomiting). (Patient not taking: Reported on 01/26/2018) 60 tablet 2   No current facility-administered medications for this visit.     OBJECTIVE: Vitals:   01/26/18 1038  BP: 131/89  Pulse: 71  Resp: 18  Temp: 97.8 F (36.6 C)     Body mass index is 49.69 kg/m.    ECOG FS:0 - Asymptomatic  General: Well-developed, well-nourished, no acute distress. Eyes: Pink conjunctiva, anicteric sclera. HEENT: Normocephalic, moist mucous membranes. Breast: Right mastectomy, exam deferred today. Lungs: Clear to auscultation bilaterally. Heart: Regular  rate and rhythm. No rubs, murmurs, or gallops. Abdomen: Soft, nontender, nondistended. No organomegaly noted, normoactive bowel sounds. Musculoskeletal: No edema, cyanosis, or clubbing. Neuro: Alert, answering all questions appropriately. Cranial nerves grossly intact. Skin: No rashes or petechiae noted. Psych: Normal affect.  LAB RESULTS:  Lab Results  Component Value Date   NA 140 10/21/2017   K 3.4 (L) 10/21/2017   CL 103 10/21/2017   CO2 26 10/21/2017   GLUCOSE 140 (H) 10/21/2017   BUN 14 10/21/2017   CREATININE 1.08 (H) 10/21/2017   CALCIUM 8.7 (L) 10/21/2017   PROT 6.9 10/21/2017   ALBUMIN 3.4 (L) 10/21/2017   AST 37 10/21/2017   ALT 48 (H) 10/21/2017   ALKPHOS 79 10/21/2017   BILITOT 0.4 10/21/2017   GFRNONAA >60 10/21/2017   GFRAA >60 10/21/2017    Lab Results  Component Value Date   WBC 5.3 10/21/2017   NEUTROABS 3.7 10/21/2017   HGB 10.7 (L) 10/21/2017   HCT 33.3 (L) 10/21/2017   MCV 80.3 10/21/2017   PLT 272 10/21/2017     STUDIES:  No results found.  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 BRCA 1 and 2 negative, Oncotype DX score 17 which is considered low risk.  PLAN:    1. 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  This occurred while patient was taking Aromasin.  Oncotype was not sent on her second breast cancer.  CT scans and bone scans reviewed independently with no evidence of metastatic disease confirming stage I malignancy.  Patient completed cycle 5 of Taxotere and Cytoxan on September 16, 2017.  Patient received 1 additional cycle secondary to a reaction she had during cycle 1 and did not complete treatment at that time.  Despite hot flashes, patient  wishes to continue letrozole at this time.  Baseline bone mineral density on November 12, 2017 was reported as normal.  Repeat in August 2021.  Return to clinic in 3 months for further evaluation.   2. Genetic testing: Patient was found to be BRCA 1 and 2 negative. She expressed understanding that although her genetic testing was negative, her children are at higher risk for breast cancer than the general population.   3.  Hot flashes: Likely secondary to letrozole.  We discussed discontinuing treatment and switching to anastrozole or possibly adding Effexor.  Patient declined both these options and wishes to continue letrozole as prescribed. 4.  Neuropathy: Chronic and unchanged.  Continue gabapentin as prescribed. 5.  Breast reconstruction: Patient has her first reconstruction surgery on February 09, 2018.  Patient expressed understanding and was in agreement with this plan. She also understands that She can call clinic at any time with any questions, concerns, or complaints.    Lloyd Huger, MD 01/27/18 9:24 AM

## 2018-01-26 ENCOUNTER — Encounter: Payer: Self-pay | Admitting: Oncology

## 2018-01-26 ENCOUNTER — Inpatient Hospital Stay: Payer: Medicaid Other | Attending: Oncology | Admitting: Oncology

## 2018-01-26 VITALS — BP 131/89 | HR 71 | Temp 97.8°F | Resp 18 | Wt 336.5 lb

## 2018-01-26 DIAGNOSIS — Z170 Estrogen receptor positive status [ER+]: Secondary | ICD-10-CM | POA: Insufficient documentation

## 2018-01-26 DIAGNOSIS — I10 Essential (primary) hypertension: Secondary | ICD-10-CM

## 2018-01-26 DIAGNOSIS — N951 Menopausal and female climacteric states: Secondary | ICD-10-CM | POA: Diagnosis not present

## 2018-01-26 DIAGNOSIS — C50411 Malignant neoplasm of upper-outer quadrant of right female breast: Secondary | ICD-10-CM | POA: Insufficient documentation

## 2018-01-26 DIAGNOSIS — G629 Polyneuropathy, unspecified: Secondary | ICD-10-CM | POA: Diagnosis not present

## 2018-01-26 DIAGNOSIS — Z79811 Long term (current) use of aromatase inhibitors: Secondary | ICD-10-CM

## 2018-01-26 NOTE — Progress Notes (Signed)
Pt in for follow up, reports still having "terrible time with hot flashes".  Pt scheduled to have surgery for right mastectomy and reconstruction on Nov 11.

## 2018-01-27 ENCOUNTER — Encounter (HOSPITAL_COMMUNITY): Payer: Self-pay

## 2018-01-27 NOTE — Progress Notes (Signed)
Social worker provided counseling to patient at the cancer center. 

## 2018-02-03 ENCOUNTER — Other Ambulatory Visit: Payer: Self-pay

## 2018-02-03 ENCOUNTER — Encounter: Payer: Self-pay | Admitting: Plastic Surgery

## 2018-02-03 ENCOUNTER — Ambulatory Visit (INDEPENDENT_AMBULATORY_CARE_PROVIDER_SITE_OTHER): Payer: Self-pay | Admitting: Plastic Surgery

## 2018-02-03 ENCOUNTER — Encounter
Admission: RE | Admit: 2018-02-03 | Discharge: 2018-02-03 | Disposition: A | Discharge status: Home / Self Care | Payer: Medicaid Other | Source: Ambulatory Visit | Attending: General Surgery | Admitting: General Surgery

## 2018-02-03 VITALS — BP 124/80 | HR 72 | Ht 69.0 in | Wt 331.0 lb

## 2018-02-03 DIAGNOSIS — Z9011 Acquired absence of right breast and nipple: Secondary | ICD-10-CM

## 2018-02-03 DIAGNOSIS — N6489 Other specified disorders of breast: Secondary | ICD-10-CM

## 2018-02-03 DIAGNOSIS — C50411 Malignant neoplasm of upper-outer quadrant of right female breast: Secondary | ICD-10-CM

## 2018-02-03 DIAGNOSIS — Z170 Estrogen receptor positive status [ER+]: Secondary | ICD-10-CM

## 2018-02-03 DIAGNOSIS — Z01818 Encounter for other preprocedural examination: Secondary | ICD-10-CM | POA: Insufficient documentation

## 2018-02-03 HISTORY — DX: Localized edema: R60.0

## 2018-02-03 HISTORY — DX: Dyspnea, unspecified: R06.00

## 2018-02-03 HISTORY — DX: Polyneuropathy, unspecified: G62.9

## 2018-02-03 LAB — BASIC METABOLIC PANEL
Anion gap: 8 (ref 5–15)
BUN: 21 mg/dL — ABNORMAL HIGH (ref 6–20)
CO2: 30 mmol/L (ref 22–32)
Calcium: 9.3 mg/dL (ref 8.9–10.3)
Chloride: 104 mmol/L (ref 98–111)
Creatinine, Ser: 0.91 mg/dL (ref 0.44–1.00)
GFR calc Af Amer: >60 mL/min (ref >60)
GFR calc non Af Amer: >60 mL/min (ref >60)
Glucose, Bld: 101 mg/dL — ABNORMAL HIGH (ref 70–99)
Potassium: 3.5 mmol/L (ref 3.5–5.1)
Sodium: 142 mmol/L (ref 135–145)

## 2018-02-03 LAB — CBC
HCT: 37.9 % (ref 36.0–46.0)
Hemoglobin: 11.7 g/dL — ABNORMAL LOW (ref 12.0–15.0)
MCH: 24.3 pg — ABNORMAL LOW (ref 26.0–34.0)
MCHC: 30.9 g/dL (ref 30.0–36.0)
MCV: 78.6 fL — ABNORMAL LOW (ref 80.0–100.0)
Platelets: 223 10*3/uL (ref 150–400)
RBC: 4.82 MIL/uL (ref 3.87–5.11)
RDW: 18.2 % — ABNORMAL HIGH (ref 11.5–15.5)
WBC: 4.8 10*3/uL (ref 4.0–10.5)
nRBC: 0 % (ref 0.0–0.2)

## 2018-02-03 MED ORDER — CHLORHEXIDINE GLUCONATE CLOTH 2 % EX PADS
6.0000 | MEDICATED_PAD | Freq: Once | CUTANEOUS | Status: DC
  Filled 2018-02-03: qty 6

## 2018-02-03 NOTE — H&P (View-Only) (Signed)
Patient ID: Michelle Mcmillan, female    DOB: 1970-04-16, 47 y.o.   MRN: [id number]   Chief Complaint  Patient presents with  . Breast Cancer    The patient is a 47 year old black female here with her husband for history and physical for breast reconstruction.  She underwent a right mastectomy by Dr. Burt Knack several months ago.  This was after undergoing a lumpectomy with radiation in 2017.  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  She is interested in a prophylactic left mastectomy and this is what we have planned for next week with Dr. Marlou Starks.  Dr. Burt Knack is not in [place] anymore.  Preop = 47 C / D cup.  She is 5 feet 9 inches tall, weight equals 331 pounds.    Review of Systems  Constitutional: Negative.  Negative for activity change and appetite change.  HENT: Negative.   Eyes: Negative.   Respiratory: Negative.   Cardiovascular: Negative.  Negative for palpitations.  Gastrointestinal: Negative.  Negative for abdominal distention.  Endocrine: Negative.   Genitourinary: Negative.   Musculoskeletal: Negative.  Negative for arthralgias.  Skin: Negative for color change and wound.  Neurological: Negative.   Hematological: Negative.   Psychiatric/Behavioral: Negative.     Past Medical History:  Diagnosis Date  . Anemia    H/O  . Anemia   . Arthritis    RIGHT KNEE  . Breast cancer ([place]) 2017   right breast  . Cancer ([place])    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)  . Dyspnea   . GERD (gastroesophageal reflux disease)    NO MEDS  . Headache    migraines  . Hypertension   . Last menstrual period (LMP) > 10 days ago 08/2015  . Lower extremity edema   . Neuropathy   . Personal history of radiation therapy     Past Surgical History:  Procedure Laterality Date  . BREAST BIOPSY Right 05/08/2017   invasive mamm. ca  . BREAST EXCISIONAL BIOPSY Right 06/15/15   breast cancer  . BREAST LUMPECTOMY WITH NEEDLE LOCALIZATION Right  06/15/2015   Procedure: BREAST LUMPECTOMY WITH NEEDLE LOCALIZATION;  Surgeon: Hubbard Robinson, MD;  Location: [facility] ORS;  Service: General;  Laterality: Right;  . BREAST SURGERY    . CESAREAN SECTION  1988   x1  . COLONOSCOPY WITH PROPOFOL N/A 01/23/2017   Procedure: COLONOSCOPY WITH PROPOFOL;  Surgeon: Lin Landsman, MD;  Location: [facility] ENDOSCOPY;  Service: Gastroenterology;  Laterality: N/A;  . ESOPHAGOGASTRODUODENOSCOPY (EGD) WITH PROPOFOL N/A 01/23/2017   Procedure: ESOPHAGOGASTRODUODENOSCOPY (EGD) WITH PROPOFOL;  Surgeon: Lin Landsman, MD;  Location: [facility] ENDOSCOPY;  Service: Gastroenterology;  Laterality: N/A;  . MASS EXCISION N/A 05/20/2016   Procedure: EXCISION CHEST WALL MASS;  Surgeon: Florene Glen, MD;  Location: [facility] ORS;  Service: General;  Laterality: N/A;  . SENTINEL NODE BIOPSY Right 06/15/2015   Procedure: SENTINEL NODE BIOPSY;  Surgeon: Hubbard Robinson, MD;  Location: [facility] ORS;  Service: General;  Laterality: Right;  . TOTAL MASTECTOMY Right 05/22/2017   Procedure: TOTAL MASTECTOMY;  Surgeon: Florene Glen, MD;  Location: [facility] ORS;  Service: General;  Laterality: Right;  . TUBAL LIGATION  2004      Current Outpatient Medications:  .  Calcium Carb-Cholecalciferol (CALCIUM-VITAMIN D) 500-200 MG-UNIT tablet, Take 1 tablet by mouth 2 (two) times daily. , Disp: , Rfl:  .  Ferrous Sulfate (IRON) 325 (65 Fe) MG TABS, Take 1  tablet (325 mg total) by mouth 2 (two) times daily., Disp: 60 each, Rfl: 2 .  gabapentin (NEURONTIN) 300 MG capsule, Take 1 capsule (300 mg total) by mouth 2 (two) times daily., Disp: 60 capsule, Rfl: 2 .  hydrochlorothiazide (HYDRODIURIL) 25 MG tablet, Take 25 mg by mouth daily., Disp: , Rfl:  .  ibuprofen (ADVIL,MOTRIN) 800 MG tablet, Take 800 mg by mouth every 8 (eight) hours as needed (pain)., Disp: , Rfl:  .  letrozole (FEMARA) 2.5 MG tablet, Take 1 tablet (2.5 mg total) by mouth daily., Disp: 30 tablet, Rfl: 3 .  naproxen sodium  (ALEVE) 220 MG tablet, Take 440 mg by mouth daily as needed (pain)., Disp: , Rfl:  .  omeprazole (PRILOSEC) 20 MG capsule, Take 1 capsule (20 mg total) by mouth daily., Disp: 30 capsule, Rfl: 3 .  potassium chloride SA (K-DUR,KLOR-CON) 20 MEQ tablet, Take 1 tablet (20 mEq total) by mouth 2 (two) times daily., Disp: 30 tablet, Rfl: 1 .  tetrahydrozoline 0.05 % ophthalmic solution, Place 2 drops into both eyes daily., Disp: , Rfl:  No current facility-administered medications for this visit.   Facility-Administered Medications Ordered in Other Visits:  .  6 CHG cloth bath night before surgery, , , Once **AND** [START ON 02/04/2018] 6 CHG cloth bath AM of surgery, , , Once **AND** Chlorhexidine Gluconate Cloth 2 % PADS 6 each, 6 each, Topical, Once **AND** Chlorhexidine Gluconate Cloth 2 % PADS 6 each, 6 each, Topical, Once, Autumn Messing III, MD   Objective:   Vitals:   02/03/18 1143  BP: 124/80  Pulse: 72  SpO2: 97%    Physical Exam  Constitutional: She is oriented to person, place, and time. She appears well-developed and well-nourished.  HENT:  Head: Normocephalic and atraumatic.  Eyes: Pupils are equal, round, and reactive to light. EOM are normal.  Neck: Normal range of motion.  Cardiovascular: Normal rate.  Pulmonary/Chest: Effort normal. No respiratory distress.  Abdominal: Soft. She exhibits no distension. There is no tenderness.  Neurological: She is alert and oriented to person, place, and time.  Skin: Skin is warm.  Psychiatric: She has a normal mood and affect. Her behavior is normal. Judgment and thought content normal.    Assessment & Plan:  Malignant neoplasm of upper-outer quadrant of right breast in female, estrogen receptor positive ([place])  Acquired absence of breast and absent nipple, right  Postoperative breast asymmetry  Plan for bilateral expander placement at the time of the left mastectomy with flex HD. The risks that can be encountered with and after  placement of a breast expander placement were discussed and include the following but not limited to these: bleeding, infection, delayed healing, anesthesia risks, skin sensation changes, injury to structures including nerves, blood vessels, and muscles which may be temporary or permanent, allergies to tape, suture materials and glues, blood products, topical preparations or injected agents, skin contour irregularities, skin discoloration and swelling, deep vein thrombosis, cardiac and pulmonary complications, pain, which may persist, fluid accumulation, wrinkling of the skin over the expander, changes in nipple or breast sensation, expander leakage or rupture, faulty position of the expander, persistent pain, formation of tight scar tissue around the expander (capsular contracture), possible need for revisional surgery or staged procedures.   [place], DO

## 2018-02-03 NOTE — Progress Notes (Signed)
Patient ID: Michelle Mcmillan, female    DOB: 09-23-1970, 47 y.o.   MRN: [id number]   Chief Complaint  Patient presents with  . Breast Cancer    The patient is a 47 year old black female here with her husband for history and physical for breast reconstruction.  She underwent a right mastectomy by Dr. Burt Knack several months ago.  This was after undergoing a lumpectomy with radiation in 2017.  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  She is interested in a prophylactic left mastectomy and this is what we have planned for next week with Dr. Marlou Starks.  Dr. Burt Knack is not in [place] anymore.  Preop = 45 C / D cup.  She is 5 feet 9 inches tall, weight equals 331 pounds.    Review of Systems  Constitutional: Negative.  Negative for activity change and appetite change.  HENT: Negative.   Eyes: Negative.   Respiratory: Negative.   Cardiovascular: Negative.  Negative for palpitations.  Gastrointestinal: Negative.  Negative for abdominal distention.  Endocrine: Negative.   Genitourinary: Negative.   Musculoskeletal: Negative.  Negative for arthralgias.  Skin: Negative for color change and wound.  Neurological: Negative.   Hematological: Negative.   Psychiatric/Behavioral: Negative.     Past Medical History:  Diagnosis Date  . Anemia    H/O  . Anemia   . Arthritis    RIGHT KNEE  . Breast cancer ([place]) 2017   right breast  . Cancer ([place])    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)  . Dyspnea   . GERD (gastroesophageal reflux disease)    NO MEDS  . Headache    migraines  . Hypertension   . Last menstrual period (LMP) > 10 days ago 08/2015  . Lower extremity edema   . Neuropathy   . Personal history of radiation therapy     Past Surgical History:  Procedure Laterality Date  . BREAST BIOPSY Right 05/08/2017   invasive mamm. ca  . BREAST EXCISIONAL BIOPSY Right 06/15/15   breast cancer  . BREAST LUMPECTOMY WITH NEEDLE LOCALIZATION Right  06/15/2015   Procedure: BREAST LUMPECTOMY WITH NEEDLE LOCALIZATION;  Surgeon: Hubbard Robinson, MD;  Location: [facility] ORS;  Service: General;  Laterality: Right;  . BREAST SURGERY    . CESAREAN SECTION  1988   x1  . COLONOSCOPY WITH PROPOFOL N/A 01/23/2017   Procedure: COLONOSCOPY WITH PROPOFOL;  Surgeon: Lin Landsman, MD;  Location: [facility] ENDOSCOPY;  Service: Gastroenterology;  Laterality: N/A;  . ESOPHAGOGASTRODUODENOSCOPY (EGD) WITH PROPOFOL N/A 01/23/2017   Procedure: ESOPHAGOGASTRODUODENOSCOPY (EGD) WITH PROPOFOL;  Surgeon: Lin Landsman, MD;  Location: [facility] ENDOSCOPY;  Service: Gastroenterology;  Laterality: N/A;  . MASS EXCISION N/A 05/20/2016   Procedure: EXCISION CHEST WALL MASS;  Surgeon: Florene Glen, MD;  Location: [facility] ORS;  Service: General;  Laterality: N/A;  . SENTINEL NODE BIOPSY Right 06/15/2015   Procedure: SENTINEL NODE BIOPSY;  Surgeon: Hubbard Robinson, MD;  Location: [facility] ORS;  Service: General;  Laterality: Right;  . TOTAL MASTECTOMY Right 05/22/2017   Procedure: TOTAL MASTECTOMY;  Surgeon: Florene Glen, MD;  Location: [facility] ORS;  Service: General;  Laterality: Right;  . TUBAL LIGATION  2004      Current Outpatient Medications:  .  Calcium Carb-Cholecalciferol (CALCIUM-VITAMIN D) 500-200 MG-UNIT tablet, Take 1 tablet by mouth 2 (two) times daily. , Disp: , Rfl:  .  Ferrous Sulfate (IRON) 325 (65 Fe) MG TABS, Take 1  tablet (325 mg total) by mouth 2 (two) times daily., Disp: 60 each, Rfl: 2 .  gabapentin (NEURONTIN) 300 MG capsule, Take 1 capsule (300 mg total) by mouth 2 (two) times daily., Disp: 60 capsule, Rfl: 2 .  hydrochlorothiazide (HYDRODIURIL) 25 MG tablet, Take 25 mg by mouth daily., Disp: , Rfl:  .  ibuprofen (ADVIL,MOTRIN) 800 MG tablet, Take 800 mg by mouth every 8 (eight) hours as needed (pain)., Disp: , Rfl:  .  letrozole (FEMARA) 2.5 MG tablet, Take 1 tablet (2.5 mg total) by mouth daily., Disp: 30 tablet, Rfl: 3 .  naproxen sodium  (ALEVE) 220 MG tablet, Take 440 mg by mouth daily as needed (pain)., Disp: , Rfl:  .  omeprazole (PRILOSEC) 20 MG capsule, Take 1 capsule (20 mg total) by mouth daily., Disp: 30 capsule, Rfl: 3 .  potassium chloride SA (K-DUR,KLOR-CON) 20 MEQ tablet, Take 1 tablet (20 mEq total) by mouth 2 (two) times daily., Disp: 30 tablet, Rfl: 1 .  tetrahydrozoline 0.05 % ophthalmic solution, Place 2 drops into both eyes daily., Disp: , Rfl:  No current facility-administered medications for this visit.   Facility-Administered Medications Ordered in Other Visits:  .  6 CHG cloth bath night before surgery, , , Once **AND** [START ON 02/04/2018] 6 CHG cloth bath AM of surgery, , , Once **AND** Chlorhexidine Gluconate Cloth 2 % PADS 6 each, 6 each, Topical, Once **AND** Chlorhexidine Gluconate Cloth 2 % PADS 6 each, 6 each, Topical, Once, Autumn Messing III, MD   Objective:   Vitals:   02/03/18 1143  BP: 124/80  Pulse: 72  SpO2: 97%    Physical Exam  Constitutional: She is oriented to person, place, and time. She appears well-developed and well-nourished.  HENT:  Head: Normocephalic and atraumatic.  Eyes: Pupils are equal, round, and reactive to light. EOM are normal.  Neck: Normal range of motion.  Cardiovascular: Normal rate.  Pulmonary/Chest: Effort normal. No respiratory distress.  Abdominal: Soft. She exhibits no distension. There is no tenderness.  Neurological: She is alert and oriented to person, place, and time.  Skin: Skin is warm.  Psychiatric: She has a normal mood and affect. Her behavior is normal. Judgment and thought content normal.    Assessment & Plan:  Malignant neoplasm of upper-outer quadrant of right breast in female, estrogen receptor positive ([place])  Acquired absence of breast and absent nipple, right  Postoperative breast asymmetry  Plan for bilateral expander placement at the time of the left mastectomy with flex HD. The risks that can be encountered with and after  placement of a breast expander placement were discussed and include the following but not limited to these: bleeding, infection, delayed healing, anesthesia risks, skin sensation changes, injury to structures including nerves, blood vessels, and muscles which may be temporary or permanent, allergies to tape, suture materials and glues, blood products, topical preparations or injected agents, skin contour irregularities, skin discoloration and swelling, deep vein thrombosis, cardiac and pulmonary complications, pain, which may persist, fluid accumulation, wrinkling of the skin over the expander, changes in nipple or breast sensation, expander leakage or rupture, faulty position of the expander, persistent pain, formation of tight scar tissue around the expander (capsular contracture), possible need for revisional surgery or staged procedures.   [place], DO

## 2018-02-03 NOTE — Patient Instructions (Signed)
given

## 2018-02-03 NOTE — Patient Instructions (Addendum)
Your procedure is scheduled on: 02/09/18 Mon Report to Same Day Surgery 2nd floor medical mall [facility] Entrance-take elevator on left to 2nd floor.  Check in with surgery information desk.) To find out your arrival time please call [phone] between 1PM - 3PM on 02/06/18 Fri  Remember: Instructions that are not followed completely may result in serious medical risk, up to and including death, or upon the discretion of your surgeon and anesthesiologist your surgery may need to be rescheduled.    _x___ 1. Do not eat food after midnight the night before your procedure. You may drink clear liquids up to 2 hours before you are scheduled to arrive at the hospital for your procedure.  Do not drink clear liquids within 2 hours of your scheduled arrival to the hospital.  Clear liquids include  --Water or Apple juice without pulp  --Clear carbohydrate beverage such as ClearFast or Gatorade  --Black Coffee or Clear Tea (No milk, no creamers, do not add anything to                  the coffee or Tea Type 1 and type 2 diabetics should only drink water.   ____Ensure clear carbohydrate drink on the way to the hospital for bariatric patients  ____Ensure clear carbohydrate drink 3 hours before surgery for Dr Dwyane Luo patients if physician instructed.   No gum chewing or hard candies.     __x__ 2. No Alcohol for 24 hours before or after surgery.   __x__3. No Smoking or e-cigarettes for 24 prior to surgery.  Do not use any chewable tobacco products for at least 6 hour prior to surgery   ____  4. Bring all medications with you on the day of surgery if instructed.    __x__ 5. Notify your doctor if there is any change in your medical condition     (cold, fever, infections).    x___6. On the morning of surgery brush your teeth with toothpaste and water.  You may rinse your mouth with mouth wash if you wish.  Do not swallow any toothpaste or mouthwash.   Do not wear jewelry, make-up, hairpins,  clips or nail polish.  Do not wear lotions, powders, or perfumes. You may wear deodorant.  Do not shave 48 hours prior to surgery. Men may shave face and neck.  Do not bring valuables to the hospital.    [facility] is not responsible for any belongings or valuables.               Contacts, dentures or bridgework may not be worn into surgery.  Leave your suitcase in the car. After surgery it may be brought to your room.  For patients admitted to the hospital, discharge time is determined by your                       treatment team.  _  Patients discharged the day of surgery will not be allowed to drive home.  You will need someone to drive you home and stay with you the night of your procedure.    Please read over the following fact sheets that you were given:   [facility] Preparing for Surgery and or MRSA Information   _x___ Take anti-hypertensive listed below, cardiac, seizure, asthma,     anti-reflux and psychiatric medicines. These include:  1. gabapentin (NEURONTIN) 300 MG capsule  2.omeprazole (PRILOSEC) 20 MG capsule  3.  4.  5.  6.  ____Fleets enema or Magnesium Citrate as directed.   _x___ Use CHG Soap or sage wipes as directed on instruction sheet   ____ Use inhalers on the day of surgery and bring to hospital day of surgery  ____ Stop Metformin and Janumet 2 days prior to surgery.    ____ Take 1/2 of usual insulin dose the night before surgery and none on the morning     surgery.   _x___ Follow recommendations from Cardiologist, Pulmonologist or PCP regarding          stopping Aspirin, Coumadin, Plavix ,Eliquis, Effient, or Pradaxa, and Pletal.  X____Stop Anti-inflammatories such as Advil, Aleve, Ibuprofen, Motrin, Naproxen, Naprosyn, Goodies powders or aspirin products. OK to take Tylenol and                          Celebrex.   _x___ Stop supplements until after surgery.  But may continue Vitamin D, Vitamin B,       and multivitamin.   ____ Bring C-Pap to the  hospital.

## 2018-02-04 ENCOUNTER — Encounter (HOSPITAL_COMMUNITY): Payer: Self-pay

## 2018-02-04 NOTE — Pre-Procedure Instructions (Signed)
EKG OK BY DR PENWARDEN 

## 2018-02-04 NOTE — Progress Notes (Signed)
Social worker provided counseling services to patient at the [employer]

## 2018-02-04 NOTE — Pre-Procedure Instructions (Signed)
EKG sent to Anesthesia for review. 

## 2018-02-08 MED ORDER — CEFAZOLIN SODIUM-DEXTROSE 2-4 GM/100ML-% IV SOLN
2.0000 g | INTRAVENOUS | Status: AC
  Administered 2018-02-09 (×2): 2 g via INTRAVENOUS

## 2018-02-09 ENCOUNTER — Observation Stay
Admission: RE | Admit: 2018-02-09 | Discharge: 2018-02-10 | Disposition: A | Discharge status: Home / Self Care | Payer: Medicaid Other | Source: Ambulatory Visit | Attending: Plastic Surgery | Admitting: Plastic Surgery

## 2018-02-09 ENCOUNTER — Ambulatory Visit: Payer: Medicaid Other | Admitting: Anesthesiology

## 2018-02-09 ENCOUNTER — Encounter: Payer: Self-pay | Admitting: Emergency Medicine

## 2018-02-09 ENCOUNTER — Other Ambulatory Visit: Payer: Self-pay

## 2018-02-09 ENCOUNTER — Encounter
Admission: RE | Disposition: A | Discharge status: Home / Self Care | Payer: Self-pay | Source: Ambulatory Visit | Attending: Plastic Surgery

## 2018-02-09 DIAGNOSIS — Z79899 Other long term (current) drug therapy: Secondary | ICD-10-CM | POA: Diagnosis not present

## 2018-02-09 DIAGNOSIS — Z923 Personal history of irradiation: Secondary | ICD-10-CM | POA: Insufficient documentation

## 2018-02-09 DIAGNOSIS — Z9011 Acquired absence of right breast and nipple: Secondary | ICD-10-CM | POA: Diagnosis not present

## 2018-02-09 DIAGNOSIS — G629 Polyneuropathy, unspecified: Secondary | ICD-10-CM | POA: Diagnosis not present

## 2018-02-09 DIAGNOSIS — Z421 Encounter for breast reconstruction following mastectomy: Secondary | ICD-10-CM | POA: Diagnosis not present

## 2018-02-09 DIAGNOSIS — Z853 Personal history of malignant neoplasm of breast: Secondary | ICD-10-CM | POA: Diagnosis not present

## 2018-02-09 DIAGNOSIS — Z9012 Acquired absence of left breast and nipple: Secondary | ICD-10-CM

## 2018-02-09 DIAGNOSIS — Z23 Encounter for immunization: Secondary | ICD-10-CM | POA: Diagnosis not present

## 2018-02-09 DIAGNOSIS — C50411 Malignant neoplasm of upper-outer quadrant of right female breast: Secondary | ICD-10-CM | POA: Diagnosis present

## 2018-02-09 DIAGNOSIS — M1711 Unilateral primary osteoarthritis, right knee: Secondary | ICD-10-CM | POA: Insufficient documentation

## 2018-02-09 DIAGNOSIS — Z4001 Encounter for prophylactic removal of breast: Secondary | ICD-10-CM | POA: Insufficient documentation

## 2018-02-09 DIAGNOSIS — N6489 Other specified disorders of breast: Secondary | ICD-10-CM | POA: Insufficient documentation

## 2018-02-09 DIAGNOSIS — I10 Essential (primary) hypertension: Secondary | ICD-10-CM | POA: Insufficient documentation

## 2018-02-09 DIAGNOSIS — C50911 Malignant neoplasm of unspecified site of right female breast: Secondary | ICD-10-CM

## 2018-02-09 HISTORY — PX: BREAST RECONSTRUCTION WITH PLACEMENT OF TISSUE EXPANDER AND FLEX HD (ACELLULAR HYDRATED DERMIS): SHX6295

## 2018-02-09 HISTORY — PX: TOTAL MASTECTOMY: SHX6129

## 2018-02-09 LAB — CBC
HEMATOCRIT: 36.7 % (ref 36.0–46.0)
Hemoglobin: 11.6 g/dL — ABNORMAL LOW (ref 12.0–15.0)
MCH: 24.2 pg — AB (ref 26.0–34.0)
MCHC: 31.6 g/dL (ref 30.0–36.0)
MCV: 76.5 fL — AB (ref 80.0–100.0)
NRBC: 0 % (ref 0.0–0.2)
Platelets: 221 10*3/uL (ref 150–400)
RBC: 4.8 MIL/uL (ref 3.87–5.11)
RDW: 18.4 % — AB (ref 11.5–15.5)
WBC: 10.4 10*3/uL (ref 4.0–10.5)

## 2018-02-09 LAB — BASIC METABOLIC PANEL
Anion gap: 10 (ref 5–15)
BUN: 19 mg/dL (ref 6–20)
CALCIUM: 8.8 mg/dL — AB (ref 8.9–10.3)
CHLORIDE: 101 mmol/L (ref 98–111)
CO2: 26 mmol/L (ref 22–32)
CREATININE: 0.81 mg/dL (ref 0.44–1.00)
GFR calc non Af Amer: >60 mL/min (ref >60)
GLUCOSE: 128 mg/dL — AB (ref 70–99)
Potassium: 3.5 mmol/L (ref 3.5–5.1)
Sodium: 137 mmol/L (ref 135–145)

## 2018-02-09 LAB — POCT PREGNANCY, URINE: Preg Test, Ur: NEGATIVE

## 2018-02-09 SURGERY — MASTECTOMY, SIMPLE
Anesthesia: General | Site: Breast | Laterality: Left

## 2018-02-09 MED ORDER — BUPIVACAINE-EPINEPHRINE (PF) 0.5% -1:200000 IJ SOLN
INTRAMUSCULAR | Status: AC
  Filled 2018-02-09: qty 30

## 2018-02-09 MED ORDER — DEXMEDETOMIDINE HCL 200 MCG/2ML IV SOLN
INTRAVENOUS | Status: DC | PRN
  Administered 2018-02-09 (×3): 4 ug via INTRAVENOUS
  Administered 2018-02-09: 8 ug via INTRAVENOUS

## 2018-02-09 MED ORDER — MIDAZOLAM HCL 2 MG/2ML IJ SOLN
1.0000 mg | Freq: Once | INTRAMUSCULAR | Status: AC
  Administered 2018-02-09: 1 mg via INTRAVENOUS

## 2018-02-09 MED ORDER — ACETAMINOPHEN 500 MG PO TABS
500.0000 mg | ORAL_TABLET | Freq: Four times a day (QID) | ORAL | Status: DC
  Administered 2018-02-09 – 2018-02-10 (×3): 500 mg via ORAL
  Filled 2018-02-09 (×2): qty 1

## 2018-02-09 MED ORDER — SEVOFLURANE IN SOLN
RESPIRATORY_TRACT | Status: AC
  Filled 2018-02-09: qty 250

## 2018-02-09 MED ORDER — KCL IN DEXTROSE-NACL 20-5-0.45 MEQ/L-%-% IV SOLN
INTRAVENOUS | Status: DC
  Administered 2018-02-09: 19:00:00 via INTRAVENOUS
  Filled 2018-02-09 (×3): qty 1000

## 2018-02-09 MED ORDER — FENTANYL CITRATE (PF) 100 MCG/2ML IJ SOLN
INTRAMUSCULAR | Status: AC
  Filled 2018-02-09: qty 2

## 2018-02-09 MED ORDER — BUPIVACAINE LIPOSOME 1.3 % IJ SUSP
INTRAMUSCULAR | Status: DC | PRN
  Administered 2018-02-09: 20 mL

## 2018-02-09 MED ORDER — LACTATED RINGERS IV SOLN
INTRAVENOUS | Status: DC
  Administered 2018-02-09 (×2): via INTRAVENOUS

## 2018-02-09 MED ORDER — POLYETHYLENE GLYCOL 3350 17 G PO PACK
17.0000 g | PACK | Freq: Every day | ORAL | Status: DC | PRN
  Filled 2018-02-09: qty 1

## 2018-02-09 MED ORDER — HYDROCODONE-ACETAMINOPHEN 5-325 MG PO TABS: 1.0000 | ORAL_TABLET | Freq: Three times a day (TID) | ORAL | 0 refills | Status: DC | PRN

## 2018-02-09 MED ORDER — NAPROXEN 500 MG PO TABS
500.0000 mg | ORAL_TABLET | Freq: Two times a day (BID) | ORAL | Status: DC | PRN
  Filled 2018-02-09: qty 1

## 2018-02-09 MED ORDER — PHENYLEPHRINE HCL 10 MG/ML IJ SOLN
INTRAMUSCULAR | Status: DC | PRN
  Administered 2018-02-09 (×2): 150 ug via INTRAVENOUS
  Administered 2018-02-09: 50 ug via INTRAVENOUS
  Administered 2018-02-09: 150 ug via INTRAVENOUS
  Administered 2018-02-09: 200 ug via INTRAVENOUS

## 2018-02-09 MED ORDER — HYDROCODONE-ACETAMINOPHEN 5-325 MG PO TABS
1.0000 | ORAL_TABLET | ORAL | Status: DC | PRN
  Administered 2018-02-10 (×2): 2 via ORAL
  Filled 2018-02-09 (×2): qty 2

## 2018-02-09 MED ORDER — ROCURONIUM BROMIDE 50 MG/5ML IV SOLN
INTRAVENOUS | Status: AC
  Filled 2018-02-09: qty 1

## 2018-02-09 MED ORDER — FENTANYL CITRATE (PF) 250 MCG/5ML IJ SOLN
INTRAMUSCULAR | Status: AC
  Filled 2018-02-09: qty 5

## 2018-02-09 MED ORDER — LIDOCAINE HCL (PF) 1 % IJ SOLN
INTRAMUSCULAR | Status: DC | PRN
  Administered 2018-02-09: 5 mL via SUBCUTANEOUS

## 2018-02-09 MED ORDER — DIPHENHYDRAMINE HCL 12.5 MG/5ML PO ELIX: 12.5000 mg | ORAL_SOLUTION | Freq: Four times a day (QID) | ORAL | Status: DC | PRN

## 2018-02-09 MED ORDER — CELECOXIB 200 MG PO CAPS
200.0000 mg | ORAL_CAPSULE | ORAL | Status: AC
  Administered 2018-02-09: 200 mg via ORAL

## 2018-02-09 MED ORDER — GABAPENTIN 300 MG PO CAPS: 300.0000 mg | ORAL_CAPSULE | ORAL | Status: DC

## 2018-02-09 MED ORDER — ONDANSETRON HCL 4 MG/2ML IJ SOLN
4.0000 mg | Freq: Four times a day (QID) | INTRAMUSCULAR | Status: DC | PRN
  Administered 2018-02-09: 4 mg via INTRAVENOUS
  Filled 2018-02-09: qty 2

## 2018-02-09 MED ORDER — FENTANYL CITRATE (PF) 100 MCG/2ML IJ SOLN
25.0000 ug | INTRAMUSCULAR | Status: DC | PRN
  Administered 2018-02-09 (×3): 25 ug via INTRAVENOUS
  Administered 2018-02-09: 50 ug via INTRAVENOUS
  Administered 2018-02-09: 25 ug via INTRAVENOUS

## 2018-02-09 MED ORDER — CEPHALEXIN 500 MG PO CAPS: 500.0000 mg | ORAL_CAPSULE | Freq: Four times a day (QID) | ORAL | 0 refills | Status: DC

## 2018-02-09 MED ORDER — MIDAZOLAM HCL 2 MG/2ML IJ SOLN
INTRAMUSCULAR | Status: AC
  Administered 2018-02-09: 1 mg via INTRAVENOUS
  Filled 2018-02-09: qty 2

## 2018-02-09 MED ORDER — PROPOFOL 500 MG/50ML IV EMUL
INTRAVENOUS | Status: AC
  Filled 2018-02-09: qty 50

## 2018-02-09 MED ORDER — MIDAZOLAM HCL 2 MG/2ML IJ SOLN
INTRAMUSCULAR | Status: DC | PRN
  Administered 2018-02-09: 2 mg via INTRAVENOUS

## 2018-02-09 MED ORDER — ONDANSETRON HCL 4 MG/2ML IJ SOLN
INTRAMUSCULAR | Status: AC
  Filled 2018-02-09: qty 2

## 2018-02-09 MED ORDER — FENTANYL CITRATE (PF) 100 MCG/2ML IJ SOLN
INTRAMUSCULAR | Status: AC
  Administered 2018-02-09: 50 ug via INTRAVENOUS
  Filled 2018-02-09: qty 2

## 2018-02-09 MED ORDER — KETAMINE HCL 50 MG/ML IJ SOLN
INTRAMUSCULAR | Status: AC
  Filled 2018-02-09: qty 10

## 2018-02-09 MED ORDER — CEFAZOLIN SODIUM 1 G IJ SOLR
INTRAMUSCULAR | Status: AC
  Filled 2018-02-09: qty 20

## 2018-02-09 MED ORDER — INFLUENZA VAC SPLIT QUAD 0.5 ML IM SUSY
0.5000 mL | PREFILLED_SYRINGE | INTRAMUSCULAR | Status: AC
  Administered 2018-02-10: 0.5 mL via INTRAMUSCULAR
  Filled 2018-02-09: qty 0.5

## 2018-02-09 MED ORDER — PROPOFOL 10 MG/ML IV BOLUS
INTRAVENOUS | Status: DC | PRN
  Administered 2018-02-09: 100 mg via INTRAVENOUS
  Administered 2018-02-09: 300 mg via INTRAVENOUS

## 2018-02-09 MED ORDER — FENTANYL CITRATE (PF) 100 MCG/2ML IJ SOLN
INTRAMUSCULAR | Status: DC | PRN
  Administered 2018-02-09: 50 ug via INTRAVENOUS
  Administered 2018-02-09: 100 ug via INTRAVENOUS
  Administered 2018-02-09 (×2): 50 ug via INTRAVENOUS

## 2018-02-09 MED ORDER — BUPIVACAINE HCL (PF) 0.5 % IJ SOLN
INTRAMUSCULAR | Status: AC
  Filled 2018-02-09: qty 10

## 2018-02-09 MED ORDER — GABAPENTIN 300 MG PO CAPS
ORAL_CAPSULE | ORAL | Status: AC
  Filled 2018-02-09: qty 1

## 2018-02-09 MED ORDER — SUGAMMADEX SODIUM 200 MG/2ML IV SOLN
INTRAVENOUS | Status: AC
  Filled 2018-02-09: qty 4

## 2018-02-09 MED ORDER — DEXAMETHASONE SODIUM PHOSPHATE 10 MG/ML IJ SOLN
INTRAMUSCULAR | Status: DC | PRN
  Administered 2018-02-09: 10 mg via INTRAVENOUS

## 2018-02-09 MED ORDER — LIDOCAINE HCL (PF) 2 % IJ SOLN
INTRAMUSCULAR | Status: AC
  Filled 2018-02-09: qty 10

## 2018-02-09 MED ORDER — HYDROMORPHONE HCL 1 MG/ML IJ SOLN
1.0000 mg | INTRAMUSCULAR | Status: DC | PRN
  Administered 2018-02-09 (×2): 1 mg via INTRAVENOUS
  Filled 2018-02-09 (×2): qty 1

## 2018-02-09 MED ORDER — SUCCINYLCHOLINE CHLORIDE 20 MG/ML IJ SOLN
INTRAMUSCULAR | Status: DC | PRN
  Administered 2018-02-09: 160 mg via INTRAVENOUS

## 2018-02-09 MED ORDER — SENNA 8.6 MG PO TABS
1.0000 | ORAL_TABLET | Freq: Two times a day (BID) | ORAL | Status: DC
  Administered 2018-02-09 – 2018-02-10 (×2): 8.6 mg via ORAL
  Filled 2018-02-09 (×2): qty 1

## 2018-02-09 MED ORDER — ONDANSETRON HCL 4 MG PO TABS: 4.0000 mg | ORAL_TABLET | Freq: Every day | ORAL | 1 refills | Status: DC | PRN

## 2018-02-09 MED ORDER — ACETAMINOPHEN 500 MG PO TABS
1000.0000 mg | ORAL_TABLET | ORAL | Status: AC
  Administered 2018-02-09: 1000 mg via ORAL

## 2018-02-09 MED ORDER — CEFAZOLIN SODIUM-DEXTROSE 2-4 GM/100ML-% IV SOLN
2.0000 g | Freq: Three times a day (TID) | INTRAVENOUS | Status: DC
  Administered 2018-02-09 – 2018-02-10 (×2): 2 g via INTRAVENOUS
  Filled 2018-02-09 (×4): qty 100

## 2018-02-09 MED ORDER — SODIUM CHLORIDE 0.9 % IV SOLN
INTRAVENOUS | Status: DC | PRN
  Administered 2018-02-09: 20 ug/min via INTRAVENOUS

## 2018-02-09 MED ORDER — ROCURONIUM BROMIDE 100 MG/10ML IV SOLN
INTRAVENOUS | Status: DC | PRN
  Administered 2018-02-09: 10 mg via INTRAVENOUS
  Administered 2018-02-09: 20 mg via INTRAVENOUS
  Administered 2018-02-09: 10 mg via INTRAVENOUS
  Administered 2018-02-09: 40 mg via INTRAVENOUS
  Administered 2018-02-09 (×2): 20 mg via INTRAVENOUS
  Administered 2018-02-09: 10 mg via INTRAVENOUS
  Administered 2018-02-09: 20 mg via INTRAVENOUS

## 2018-02-09 MED ORDER — LIDOCAINE HCL (PF) 1 % IJ SOLN
INTRAMUSCULAR | Status: AC
  Filled 2018-02-09: qty 5

## 2018-02-09 MED ORDER — ONDANSETRON HCL 4 MG/2ML IJ SOLN
INTRAMUSCULAR | Status: DC | PRN
  Administered 2018-02-09: 4 mg via INTRAVENOUS

## 2018-02-09 MED ORDER — DEXAMETHASONE SODIUM PHOSPHATE 10 MG/ML IJ SOLN
INTRAMUSCULAR | Status: AC
  Filled 2018-02-09: qty 1

## 2018-02-09 MED ORDER — CEFAZOLIN SODIUM-DEXTROSE 2-4 GM/100ML-% IV SOLN
INTRAVENOUS | Status: AC
  Filled 2018-02-09: qty 100

## 2018-02-09 MED ORDER — ACETAMINOPHEN 500 MG PO TABS
ORAL_TABLET | ORAL | Status: AC
  Administered 2018-02-09: 1000 mg via ORAL
  Filled 2018-02-09: qty 2

## 2018-02-09 MED ORDER — BUPIVACAINE HCL (PF) 0.5 % IJ SOLN
INTRAMUSCULAR | Status: DC | PRN
  Administered 2018-02-09: 10 mL

## 2018-02-09 MED ORDER — BUPIVACAINE LIPOSOME 1.3 % IJ SUSP
INTRAMUSCULAR | Status: AC
  Filled 2018-02-09: qty 20

## 2018-02-09 MED ORDER — MIDAZOLAM HCL 2 MG/2ML IJ SOLN
INTRAMUSCULAR | Status: AC
  Filled 2018-02-09: qty 2

## 2018-02-09 MED ORDER — DIPHENHYDRAMINE HCL 50 MG/ML IJ SOLN: 12.5000 mg | Freq: Four times a day (QID) | INTRAMUSCULAR | Status: DC | PRN

## 2018-02-09 MED ORDER — SUCCINYLCHOLINE CHLORIDE 20 MG/ML IJ SOLN
INTRAMUSCULAR | Status: AC
  Filled 2018-02-09: qty 1

## 2018-02-09 MED ORDER — FENTANYL CITRATE (PF) 100 MCG/2ML IJ SOLN
50.0000 ug | Freq: Once | INTRAMUSCULAR | Status: AC
  Administered 2018-02-09: 50 ug via INTRAVENOUS

## 2018-02-09 MED ORDER — CELECOXIB 200 MG PO CAPS
ORAL_CAPSULE | ORAL | Status: AC
  Administered 2018-02-09: 200 mg via ORAL
  Filled 2018-02-09: qty 1

## 2018-02-09 MED ORDER — LIDOCAINE HCL (CARDIAC) PF 100 MG/5ML IV SOSY
PREFILLED_SYRINGE | INTRAVENOUS | Status: DC | PRN
  Administered 2018-02-09: 100 mg via INTRAVENOUS

## 2018-02-09 MED ORDER — PROMETHAZINE HCL 25 MG/ML IJ SOLN: 6.2500 mg | INTRAMUSCULAR | Status: DC | PRN

## 2018-02-09 MED ORDER — NEOMYCIN-POLYMYXIN B GU 40-200000 IR SOLN
Status: AC
  Filled 2018-02-09: qty 1

## 2018-02-09 MED ORDER — KETAMINE HCL 50 MG/ML IJ SOLN
INTRAMUSCULAR | Status: DC | PRN
  Administered 2018-02-09: 10 mg via INTRAMUSCULAR
  Administered 2018-02-09: 25 mg via INTRAMUSCULAR
  Administered 2018-02-09: 10 mg via INTRAMUSCULAR

## 2018-02-09 MED ORDER — SUGAMMADEX SODIUM 200 MG/2ML IV SOLN
INTRAVENOUS | Status: DC | PRN
  Administered 2018-02-09: 400 mg via INTRAVENOUS

## 2018-02-09 MED ORDER — DIAZEPAM 2 MG PO TABS: 2.0000 mg | ORAL_TABLET | Freq: Two times a day (BID) | ORAL | Status: DC | PRN

## 2018-02-09 MED ORDER — FENTANYL CITRATE (PF) 100 MCG/2ML IJ SOLN
INTRAMUSCULAR | Status: AC
  Administered 2018-02-09: 25 ug via INTRAVENOUS
  Filled 2018-02-09: qty 2

## 2018-02-09 MED ORDER — DIAZEPAM 2 MG PO TABS: 2.0000 mg | ORAL_TABLET | Freq: Two times a day (BID) | ORAL | 0 refills | Status: DC | PRN

## 2018-02-09 MED ORDER — ONDANSETRON 4 MG PO TBDP: 4.0000 mg | ORAL_TABLET | Freq: Four times a day (QID) | ORAL | Status: DC | PRN

## 2018-02-09 SURGICAL SUPPLY — 97 items
APPLIER CLIP 11 MED OPEN (CLIP) ×4
APPLIER CLIP ROT 13.4 12 LRG (CLIP) ×4
BAG DECANTER FOR FLEXI CONT (MISCELLANEOUS) ×4 IMPLANT
BANDAGE ELASTIC 6 LF NS (GAUZE/BANDAGES/DRESSINGS) ×4 IMPLANT
BINDER BREAST LRG (GAUZE/BANDAGES/DRESSINGS) IMPLANT
BINDER BREAST MEDIUM (GAUZE/BANDAGES/DRESSINGS) IMPLANT
BINDER BREAST XLRG (GAUZE/BANDAGES/DRESSINGS) IMPLANT
BINDER BREAST XXLRG (GAUZE/BANDAGES/DRESSINGS) IMPLANT
BIOPATCH WHT 1IN DISK W/4.0 H (GAUZE/BANDAGES/DRESSINGS) ×10 IMPLANT
BLADE BOVIE TIP EXT 4 (BLADE) ×4 IMPLANT
BLADE PHOTON ILLUMINATED (MISCELLANEOUS) ×2 IMPLANT
BLADE SURG 15 STRL LF DISP TIS (BLADE) ×2 IMPLANT
BLADE SURG 15 STRL SS (BLADE) ×2
BLADE SURG 15 STRL SS SAFETY (BLADE) ×4 IMPLANT
BNDG GAUZE 4.5X4.1 6PLY STRL (MISCELLANEOUS) ×8 IMPLANT
BULB RESERV EVAC DRAIN JP 100C (MISCELLANEOUS) ×8 IMPLANT
CANISTER SUCT 1200ML W/VALVE (MISCELLANEOUS) ×8 IMPLANT
CHLORAPREP W/TINT 26ML (MISCELLANEOUS) ×12 IMPLANT
CLIP APPLIE 11 MED OPEN (CLIP) IMPLANT
CLIP APPLIE ROT 13.4 12 LRG (CLIP) IMPLANT
CNTNR SPEC 2.5X3XGRAD LEK (MISCELLANEOUS)
CONT SPEC 4OZ STER OR WHT (MISCELLANEOUS)
CONTAINER SPEC 2.5X3XGRAD LEK (MISCELLANEOUS) ×6 IMPLANT
COVER WAND RF STERILE (DRAPES) ×4 IMPLANT
DECANTER SPIKE VIAL GLASS SM (MISCELLANEOUS) IMPLANT
DERMABOND ADVANCED (GAUZE/BANDAGES/DRESSINGS) ×8
DERMABOND ADVANCED .7 DNX12 (GAUZE/BANDAGES/DRESSINGS) ×8 IMPLANT
DEVICE DUBIN SPECIMEN MAMMOGRA (MISCELLANEOUS) ×2 IMPLANT
DRAIN CHANNEL 19F RND (DRAIN) ×12 IMPLANT
DRAPE CHEST BREAST 77X106 FENE (MISCELLANEOUS) ×4 IMPLANT
DRAPE SURG 17X11 SM STRL (DRAPES) ×8 IMPLANT
DRAPE UTILITY 15X26 TOWEL STRL (DRAPES) ×8 IMPLANT
DRSG TELFA 3X8 NADH (GAUZE/BANDAGES/DRESSINGS) ×4 IMPLANT
ELECT CAUTERY BLADE 6.4 (BLADE) ×4 IMPLANT
ELECT CAUTERY BLADE TIP 2.5 (TIP) ×4
ELECT REM PT RETURN 9FT ADLT (ELECTROSURGICAL) ×4
ELECTRODE CAUTERY BLDE TIP 2.5 (TIP) ×4 IMPLANT
ELECTRODE REM PT RTRN 9FT ADLT (ELECTROSURGICAL) ×2 IMPLANT
GAUZE SPONGE 4X4 12PLY STRL (GAUZE/BANDAGES/DRESSINGS) ×4 IMPLANT
GLOVE BIO SURGEON STRL SZ 6.5 (GLOVE) ×14 IMPLANT
GLOVE BIO SURGEON STRL SZ7.5 (GLOVE) ×4 IMPLANT
GLOVE BIO SURGEONS STRL SZ 6.5 (GLOVE) ×6
GOWN STRL REUS W/ TWL LRG LVL3 (GOWN DISPOSABLE) ×12 IMPLANT
GOWN STRL REUS W/TWL LRG LVL3 (GOWN DISPOSABLE) ×12
GRAFT FLEX HD 4X16 THICK (Tissue Mesh) ×6 IMPLANT
IMPL EXPANDER BREAST 535CC (Breast) IMPLANT
IMPLANT BREAST 535CC (Breast) ×4 IMPLANT
IMPLANT EXPANDER BREAST 535CC (Breast) ×4 IMPLANT
IV NS 1000ML (IV SOLUTION) ×2
IV NS 1000ML BAXH (IV SOLUTION) ×2 IMPLANT
IV NS 500ML (IV SOLUTION) ×2
IV NS 500ML BAXH (IV SOLUTION) ×2 IMPLANT
LABEL OR SOLS (LABEL) ×4 IMPLANT
NDL 21 GA WING INFUSION (NEEDLE) ×2 IMPLANT
NDL HYPO 25X1 1.5 SAFETY (NEEDLE) IMPLANT
NEEDLE 21 GA WING INFUSION (NEEDLE) ×4 IMPLANT
NEEDLE HYPO 22GX1.5 SAFETY (NEEDLE) ×4 IMPLANT
NEEDLE HYPO 25X1 1.5 SAFETY (NEEDLE) IMPLANT
PACK BASIN MAJOR ARMC (MISCELLANEOUS) ×4 IMPLANT
PACK BASIN MINOR ARMC (MISCELLANEOUS) ×4 IMPLANT
PACK UNIVERSAL (MISCELLANEOUS) ×4 IMPLANT
PAD ABD DERMACEA PRESS 5X9 (GAUZE/BANDAGES/DRESSINGS) ×20 IMPLANT
PAD DRESSING TELFA 3X8 NADH (GAUZE/BANDAGES/DRESSINGS) ×2 IMPLANT
PAD PREP 24X41 OB/GYN DISP (PERSONAL CARE ITEMS) ×8 IMPLANT
PENCIL SMOKE ULTRAEVAC 22 CON (MISCELLANEOUS) ×2 IMPLANT
PIN SAFETY STRL (MISCELLANEOUS) ×6 IMPLANT
SET ASEPTIC TRANSFER (MISCELLANEOUS) ×12 IMPLANT
SLEVE PROBE SENORX GAMMA FIND (MISCELLANEOUS) ×2 IMPLANT
SPONGE LAP 18X18 RF (DISPOSABLE) ×16 IMPLANT
SUT ETHILON 3 0 PS 1 (SUTURE) ×4 IMPLANT
SUT ETHILON 3-0 FS-10 30 BLK (SUTURE) ×4
SUT MNCRL 3-0 UNDYED SH (SUTURE) ×4 IMPLANT
SUT MNCRL AB 4-0 PS2 18 (SUTURE) ×12 IMPLANT
SUT MNCRL+ 5-0 UNDYED PC-3 (SUTURE) ×6 IMPLANT
SUT MONOCRYL 3-0 UNDYED (SUTURE) ×4
SUT MONOCRYL 5-0 (SUTURE) ×6
SUT PDS AB 1 CT1 27 (SUTURE) ×4 IMPLANT
SUT PDS PLUS 2 (SUTURE) ×12
SUT PDS PLUS AB 2-0 CT-1 (SUTURE) ×12 IMPLANT
SUT SILK 0 (SUTURE) ×2
SUT SILK 0 30XBRD TIE 6 (SUTURE) ×2 IMPLANT
SUT SILK 3 0 (SUTURE) ×2
SUT SILK 3 0 SH 30 (SUTURE) ×8 IMPLANT
SUT SILK 3-0 18XBRD TIE 12 (SUTURE) ×2 IMPLANT
SUT VIC AB 0 CT1 36 (SUTURE) ×4 IMPLANT
SUT VIC AB 2-0 CT1 27 (SUTURE) ×8
SUT VIC AB 2-0 CT1 TAPERPNT 27 (SUTURE) ×8 IMPLANT
SUT VIC AB 3-0 SH 27 (SUTURE) ×4
SUT VIC AB 3-0 SH 27X BRD (SUTURE) ×4 IMPLANT
SUT VICRYL+ 3-0 144IN (SUTURE) ×4 IMPLANT
SUTURE EHLN 3-0 FS-10 30 BLK (SUTURE) ×2 IMPLANT
SYR 10ML LL (SYRINGE) ×4 IMPLANT
SYR BULB IRRIG 60ML STRL (SYRINGE) ×8 IMPLANT
SYR CONTROL 10ML (SYRINGE) IMPLANT
TAPE TRANSPORE STRL 2 31045 (GAUZE/BANDAGES/DRESSINGS) ×4 IMPLANT
TOWEL OR 17X26 4PK STRL BLUE (TOWEL DISPOSABLE) ×4 IMPLANT
TRAY FOLEY SLVR 16FR LF STAT (SET/KITS/TRAYS/PACK) ×4 IMPLANT

## 2018-02-09 NOTE — Interval H&P Note (Deleted)
History and Physical Interval Note:  02/09/2018 10:04 AM  [name]  has presented today for surgery, with the diagnosis of HISTORY OF RIGHT BREAST CANCER  The various methods of treatment have been discussed with the patient and family. After consideration of risks, benefits and other options for treatment, the patient has consented to  Procedure(s): LEFT PROPHYLACTIC  MASTECTOMY (Left) BREAST RECONSTRUCTION WITH PLACEMENT OF TISSUE EXPANDER AND FLEX HD (ACELLULAR HYDRATED DERMIS) (Left) as a surgical intervention .  The patient's history has been reviewed, patient examined, no change in status, stable for surgery.  I have reviewed the patient's chart and labs.  Questions were answered to the patient's satisfaction.     Michelle Mcmillan

## 2018-02-09 NOTE — Interval H&P Note (Signed)
History and Physical Interval Note:  02/09/2018 10:09 AM  Michelle Mcmillan  has presented today for surgery, with the diagnosis of HISTORY OF RIGHT BREAST CANCER  The various methods of treatment have been discussed with the patient and family. After consideration of risks, benefits and other options for treatment, the patient has consented to  Procedure(s): LEFT PROPHYLACTIC  MASTECTOMY (Left) BREAST RECONSTRUCTION WITH PLACEMENT OF TISSUE EXPANDER AND FLEX HD (ACELLULAR HYDRATED DERMIS) (Bilateral) as a surgical intervention .  The patient's history has been reviewed, patient examined, no change in status, stable for surgery.  I have reviewed the patient's chart and labs.  Questions were answered to the patient's satisfaction.     Loel Lofty Labrina Lines

## 2018-02-09 NOTE — Progress Notes (Signed)
Per Dr Marlou Starks patient can have her IV in the left arm due to not removing nodes on that side.

## 2018-02-09 NOTE — Anesthesia Procedure Notes (Addendum)
Anesthesia Regional Block: Pectoralis block (PECS 1 And 2)   Pre-Anesthetic Checklist: ,, timeout performed, Correct Patient, Correct Site, Correct Laterality, Correct Procedure, Correct Position, site marked, Risks and benefits discussed,  Surgical consent,  Pre-op evaluation,  At surgeon's request and post-op pain management  Laterality: Left  Prep: chloraprep       Needles:  Injection technique: Single-shot  Needle Type: Stimiplex     Needle Length: 10cm  Needle Gauge: 22     Additional Needles:   Procedures:,,,, ultrasound used (permanent image in chart),,,,  Narrative:  Start time: 02/09/2018 11:56 AM End time: 02/09/2018 11:09 AM Injection made incrementally with aspirations every 5 mL.  Performed by: Personally  Anesthesiologist: Martha Clan, MD  Additional Notes: Functioning IV was confirmed and monitors were applied.  A 30mm 22ga Stimuplex needle was used. Sterile prep and drape,hand hygiene and sterile gloves were used.  Negative aspiration and negative test dose prior to incremental administration of local anesthetic. The patient tolerated the procedure well.

## 2018-02-09 NOTE — Anesthesia Procedure Notes (Signed)
Procedure Name: Intubation Date/Time: 02/09/2018 12:05 PM Performed by: Lavone Orn, CRNA Pre-anesthesia Checklist: Patient identified, Emergency Drugs available, Suction available, Patient being monitored and Timeout performed Patient Re-evaluated:Patient Re-evaluated prior to induction Oxygen Delivery Method: Circle system utilized Preoxygenation: Pre-oxygenation with 100% oxygen Induction Type: IV induction Ventilation: Mask ventilation without difficulty Laryngoscope Size: Mac and 4 Grade View: Grade II Tube type: Oral Tube size: 7.0 mm Number of attempts: 1 Airway Equipment and Method: Stylet (Ramp positioning) Placement Confirmation: ETT inserted through vocal cords under direct vision,  positive ETCO2 and breath sounds checked- equal and bilateral Secured at: 22 cm Tube secured with: Tape Dental Injury: Teeth and Oropharynx as per pre-operative assessment

## 2018-02-09 NOTE — Anesthesia Preprocedure Evaluation (Signed)
Anesthesia Evaluation  Patient identified by MRN, date of birth, ID band Patient awake    Reviewed: Allergy & Precautions, H&P , NPO status , Patient's Chart, lab work & pertinent test results, reviewed documented beta blocker date and time   History of Anesthesia Complications Negative for: history of anesthetic complications  Airway Mallampati: I  TM Distance: >3 FB Neck ROM: full    Dental  (+) Dental Advidsory Given, Missing, Teeth Intact   Pulmonary neg pulmonary ROS,           Cardiovascular Exercise Tolerance: Good hypertension, (-) angina(-) CAD, (-) Past MI, (-) Cardiac Stents and (-) CABG (-) dysrhythmias (-) Valvular Problems/Murmurs     Neuro/Psych negative neurological ROS  negative psych ROS   GI/Hepatic Neg liver ROS, GERD  ,  Endo/Other  neg diabetesMorbid obesity  Renal/GU negative Renal ROS  negative genitourinary   Musculoskeletal   Abdominal   Peds  Hematology  (+) Blood dyscrasia, anemia ,   Anesthesia Other Findings Past Medical History: No date: Anemia     Comment:  H/O No date: Anemia No date: Arthritis     Comment:  RIGHT KNEE 2017: Breast cancer ([place])     Comment:  right breast No date: Cancer [facility])     Comment:  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) No date: Dyspnea No date: GERD (gastroesophageal reflux disease)     Comment:  NO MEDS No date: Headache     Comment:  migraines No date: Hypertension 08/2015: Last menstrual period (LMP) > 10 days ago No date: Lower extremity edema No date: Neuropathy No date: Personal history of radiation therapy   Reproductive/Obstetrics negative OB ROS                             Anesthesia Physical Anesthesia Plan  ASA: III  Anesthesia Plan: General   Post-op Pain Management:  Regional for Post-op pain   Induction: Intravenous  PONV Risk Score and Plan: 3 and Ondansetron, Dexamethasone, Midazolam, Promethazine and  Treatment may vary due to age or medical condition  Airway Management Planned: Oral ETT and LMA  Additional Equipment:   Intra-op Plan:   Post-operative Plan: Extubation in OR  Informed Consent: I have reviewed the patients History and Physical, chart, labs and discussed the procedure including the risks, benefits and alternatives for the proposed anesthesia with the patient or authorized representative who has indicated his/her understanding and acceptance.   Dental Advisory Given  Plan Discussed with: Anesthesiologist, CRNA and Surgeon  Anesthesia Plan Comments:         Anesthesia Quick Evaluation

## 2018-02-09 NOTE — Op Note (Signed)
Op report    DATE OF OPERATION:  02/09/2018  LOCATION: [facility]  SURGICAL DIVISION: Plastic Surgery  PREOPERATIVE DIAGNOSES:  1. Right Breast cancer.    POSTOPERATIVE DIAGNOSES:  1. Right Breast cancer.   PROCEDURE:  1. Bilateral breast reconstruction with placement of Acellular Dermal Matrix and tissue expanders. 2. Right breast capsulectomy.  SURGEON: Bevin Mayall [place]anger Kiosha Buchan, DO  ANESTHESIA:  General.   COMPLICATIONS: None.   IMPLANTS: Left - Mentor 535 cc. Ref #[id number].  Serial Number [id number], 150 cc of injectable saline placed in the expander. Right - Mentor 353 cc. Ref #[id number].  Serial Number [id number], 150 cc of injectable saline placed in the expander. Acellular Dermal Matrix 4 x 16 cm two  INDICATIONS FOR PROCEDURE:  The patient, Michelle Mcmillan, is a 46 y.o. female born on [birth year], is here for left immediate first stage breast reconstruction with placement of tissue expander and Acellular dermal matrix.  We will also place a tissue expander and Acellular dermal matrix on the right side. MRN: [id number]  CONSENT:  Informed consent was obtained directly from the patient. Risks, benefits and alternatives were fully discussed. Specific risks including but not limited to bleeding, infection, hematoma, seroma, scarring, pain, implant infection, implant extrusion, capsular contracture, asymmetry, wound healing problems, and need for further surgery were all discussed. The patient did have an ample opportunity to have her questions answered to her satisfaction.   DESCRIPTION OF PROCEDURE:  The patient was taken to the operating room by the general surgery team. SCDs were placed and IV antibiotics were given. The patient's chest was prepped and draped in a sterile fashion. A time out was performed and the implants to be used were identified.  A left mastectomy was performed.  Once the general surgery team had completed their portion of the case the  patient was rendered to the plastic and reconstructive surgery team.  The right mastectomy was done previously.  Right: The previous mastectomy incision was excised with a #10 blade.  The bovie was used to dissect to the pectoralis muscle and free it from the superior and inferior flaps. The pectoralis major muscle was lifted from the chest wall with release of the lateral edge and lateral inframammary fold.  There was extensive scar tissue in the pocket lateral to the pectoralis muscle and over the muscle.  The pocket had a seroma which was drained upon entry. An extensive capsulectomy was done to be able to reach the inferior portion of the pectoralis muscle and release the muscle so it would be able to expand in time. The pocket was irrigated with antibiotic solution and hemostasis was achieved with electrocautery.  The ADM was then prepared according to the manufacture guidelines and slits placed to help with postoperative fluid management.  The ADM was then sutured to the inferior and lateral edge of the inframammary fold with 2-0 PDS starting with an interrupted stitch and then a running stitch.  The lateral portion was sutured to with interrupted sutures after the expander was placed.  The expander was prepared according to the manufacture guidelines, the air evacuated and then it was placed under the ADM and pectoralis major muscle.  The inferior and lateral tabs were used to secure the expander to the chest wall with 2-0 PDS.  The drain was placed at the inframammary fold over the ADM and secured to the skin with 3-0 Silk.  The deep layers were closed with 3-0 Monocryl followed by 4-0 Monocryl.  The skin was closed  with 5-0 Monocryl and then dermabond was applied.    Left: The pectoralis major muscle was lifted from the chest wall with release of the lateral edge and lateral inframammary fold.  The pocket was irrigated with antibiotic solution and hemostasis was achieved with electrocautery.  The ADM  was then prepared according to the manufacture guidelines and slits placed to help with postoperative fluid management.  The ADM was then sutured to the inferior and lateral edge of the inframammary fold with 2-0 PDS starting with an interrupted stitch and then a running stitch.  The lateral portion was sutured to with interrupted sutures after the expander was placed.  The expander was prepared according to the manufacture guidelines, the air evacuated and then it was placed under the ADM and pectoralis major muscle.  The inferior and lateral tabs were used to secure the expander to the chest wall with 2-0 PDS.  The drain was placed at the inframammary fold over the ADM and secured to the skin with 3-0 Silk.  The deep layers were closed with 3-0 Monocryl followed by 4-0 Monocryl.  The skin was closed with 5-0 Monocryl and then dermabond was applied.  The ABDs and breast binder were placed.  The patient tolerated the procedure well and there were no complications.  The patient was allowed to wake from anesthesia and taken to the recovery room in satisfactory condition.

## 2018-02-09 NOTE — Op Note (Signed)
02/09/2018  1:31 PM  PATIENT:  Michelle Mcmillan  47 y.o. female  PRE-OPERATIVE DIAGNOSIS:  HISTORY OF RIGHT BREAST CANCER  POST-OPERATIVE DIAGNOSIS:  HISTORY OF RIGHT BREAST CANCER  PROCEDURE:  Procedure(s): LEFT PROPHYLACTIC  MASTECTOMY (Left)   SURGEON:  Surgeon(s) and Role: Panel 1:    * Jovita Kussmaul, MD - Primary   PHYSICIAN ASSISTANT:   ASSISTANTS: none   ANESTHESIA:   none  EBL:  minimal   BLOOD ADMINISTERED:none  DRAINS: none   LOCAL MEDICATIONS USED:  NONE  SPECIMEN:  Source of Specimen:  left mastectomy  DISPOSITION OF SPECIMEN:  PATHOLOGY  COUNTS:  YES  TOURNIQUET:  * No tourniquets in log *  DICTATION: .Dragon Dictation   After informed consent was obtained the patient was brought to the operating room and placed in the supine position on the operating table.  After adequate induction of general anesthesia the patient's bilateral chest, breast, and axillary areas were prepped with ChloraPrep, allowed to dry, and draped in usual sterile manner.  An appropriate timeout was performed.  Attention was turned to the left breast.  An elliptical incision was made around the nipple and areola complex in order to spare the skin.  The incision was carried through the skin and subcutaneous tissue sharply with the electrocautery.  Breast hooks were then used to elevate skin flaps anteriorly towards the ceiling.  Thin skin flaps were then created circumferentially by dissection between the breast tissue and the subcutaneous fat.  This dissection was carried all the way to the chest wall circumferentially.  Next the breast was removed from the pectoralis muscle with the pectoralis fascia.  Once this was accomplished the breast was removed from the patient and marked with a stitch on the lateral skin.  The breast was sent to pathology for further evaluation.  Hemostasis was achieved using the Bovie electrocautery.  The wound was irrigated with copious amounts of saline.  The  wound was then packed with a moistened lap sponge.  [name] is going to perform a reconstruction and her portion will be dictated separately.  At the end of the case all needle sponge and instrument counts were correct.  The patient had tolerated this portion of the procedure well.  The patient was in stable condition.  PLAN OF CARE: Admit for overnight observation  PATIENT DISPOSITION:  PACU - hemodynamically stable.   Delay start of Pharmacological VTE agent (>24hrs) due to surgical blood loss or risk of bleeding: no

## 2018-02-09 NOTE — Transfer of Care (Signed)
Immediate Anesthesia Transfer of Care Note  Patient: Michelle Mcmillan  Procedure(s) Performed: LEFT PROPHYLACTIC  MASTECTOMY (Left Breast) BREAST RECONSTRUCTION WITH PLACEMENT OF TISSUE EXPANDER AND FLEX HD (ACELLULAR HYDRATED DERMIS) (Left )  Patient Location: PACU  Anesthesia Type:General  Level of Consciousness: drowsy  Airway & Oxygen Therapy: Patient Spontanous Breathing and Patient connected to face mask  Post-op Assessment: Report given to RN and Post -op Vital signs reviewed and stable  Post vital signs: stable  Last Vitals:  Vitals Value Taken Time  BP 143/82 02/09/2018  4:25 PM  Temp 37 C 02/09/2018  4:25 PM  Pulse 78 02/09/2018  4:35 PM  Resp 16 02/09/2018  4:35 PM  SpO2 100 % 02/09/2018  4:35 PM  Vitals shown include unvalidated device data.  Last Pain:  Vitals:   02/09/18 1625  TempSrc:   PainSc: Asleep         Complications: No apparent anesthesia complications

## 2018-02-09 NOTE — Addendum Note (Signed)
Addended by: Wallace Going on: 02/09/2018 05:54 PM   Modules accepted: Orders

## 2018-02-09 NOTE — H&P (Signed)
Michelle Mcmillan  Location: [facility] Patient #: [id number] DOB: 05/16/1970 Married / Language: English / Race: Black or African American Female   History of Present Illness  The patient is a 47 year old female who presents with breast cancer. We are asked to see the patient in consultation by Dr. Phoebe Perch to evaluate her for a right breast cancer. The patient is a 47 year old white female who is about 7 months status post right mastectomy and sentinel node mapping for a T1 cN0 right breast cancer that was ER and PR positive and HER-2 negative. This was actually her second right breast cancer in the last 2 years. Because of the rapid recurrence of the cancer in the right breast she favors a left prophylactic mastectomy at this point. She really does not want to emotionally go through having another cancer breast. She is having some tenderness along the right chest wall. She has already been evaluated by [name] and plastic surgery for the reconstruction.   Past Surgical History  Breast Biopsy  Right. Breast Mass; Local Excision  Right. Cesarean Section - 1  Colon Polyp Removal - Colonoscopy  Mastectomy  Right. Oral Surgery   Diagnostic Studies History  Colonoscopy  within last year Mammogram  1-3 years ago Pap Smear  1-5 years ago  Allergies Nitroglycerin *ANTIANGINAL AGENTS*   Medication History  Letrozole (2.5MG Tablet, Oral) Active. Gabapentin (300MG Capsule, Oral) Active. Vitamin D (1000UNIT Tablet, Oral) Active. hydroCHLOROthiazide (25MG Tablet, Oral) Active. Tetrahydrozoline HCl (0.05% Solution, Ophthalmic) Active. Potassium Bicarbonate (20MEQ Tablet Effer, Oral) Active. Ferrous Sulfate (325 (65 Fe)MG Tablet, Oral) Active. Prochlorperazine Maleate (10MG Tablet, Oral) Active. Omeprazole (20MG Capsule DR, Oral) Active. Medications Reconciled  Social History  Alcohol use  Occasional alcohol use. Caffeine use  Carbonated  beverages, Tea. No drug use  Tobacco use  Never smoker.  Family History  Alcohol Abuse  Family Members In General. Diabetes Mellitus  Father. Hypertension  Father, Mother. Seizure disorder  Sister.  Pregnancy / Birth History Age at menarche  37 years. Contraceptive History  Contraceptive implant, Oral contraceptives. Gravida  4 Length (months) of breastfeeding  3-6 Maternal age  3-20 Para  4  Other Problems Breast Cancer  Gastroesophageal Reflux Disease  Hemorrhoids  High blood pressure  Lump In Breast  Migraine Headache     Review of Systems  General Present- Fatigue and Night Sweats. Not Present- Appetite Loss, Chills, Fever, Weight Gain and Weight Loss. Skin Present- Dryness. Not Present- Change in Wart/Mole, Hives, Jaundice, New Lesions, Non-Healing Wounds, Rash and Ulcer. HEENT Present- Visual Disturbances and Wears glasses/contact lenses. Not Present- Earache, Hearing Loss, Hoarseness, Nose Bleed, Oral Ulcers, Ringing in the Ears, Seasonal Allergies, Sinus Pain, Sore Throat and Yellow Eyes. Respiratory Present- Snoring. Not Present- Bloody sputum, Chronic Cough, Difficulty Breathing and Wheezing. Breast Not Present- Breast Mass, Breast Pain, Nipple Discharge and Skin Changes. Cardiovascular Present- Swelling of Extremities. Not Present- Chest Pain, Difficulty Breathing Lying Down, Leg Cramps, Palpitations, Rapid Heart Rate and Shortness of Breath. Gastrointestinal Present- Hemorrhoids and Indigestion. Not Present- Abdominal Pain, Bloating, Bloody Stool, Change in Bowel Habits, Chronic diarrhea, Constipation, Difficulty Swallowing, Excessive gas, Gets full quickly at meals, Nausea, Rectal Pain and Vomiting. Musculoskeletal Present- Joint Pain and Joint Stiffness. Not Present- Back Pain, Muscle Pain, Muscle Weakness and Swelling of Extremities. Neurological Present- Headaches. Not Present- Decreased Memory, Fainting, Numbness, Seizures, Tingling, Tremor,  Trouble walking and Weakness. Psychiatric Present- Depression. Not Present- Anxiety, Bipolar, Change in Sleep Pattern, Fearful and Frequent  crying. Endocrine Present- Hot flashes. Not Present- Cold Intolerance, Excessive Hunger, Hair Changes, Heat Intolerance and New Diabetes.  Vitals  Weight: 349.4 lb Height: 69in Body Surface Area: 2.62 m Body Mass Index: 51.6 kg/m  Temp.: 98.53F(Oral)  Pulse: 127 (Regular)  BP: 130/82 (Sitting, Left Arm, Standard)       Physical Exam  General Mental Status-Alert. General Appearance-Consistent with stated age. Hydration-Well hydrated. Voice-Normal.  Head and Neck Head-normocephalic, atraumatic with no lesions or palpable masses. Trachea-midline. Thyroid Gland Characteristics - normal size and consistency.  Eye Eyeball - Bilateral-Extraocular movements intact. Sclera/Conjunctiva - Bilateral-No scleral icterus.  Chest and Lung Exam Chest and lung exam reveals -quiet, even and easy respiratory effort with no use of accessory muscles and on auscultation, normal breath sounds, no adventitious sounds and normal vocal resonance. Inspection Chest Wall - Normal. Back - normal.  Breast Note: The right mastectomy incision is healing nicely with no sign of infection or significant seroma. There is no palpable mass of the right chest wall. There is no palpable mass of the left breast. There is no palpable axillary, supraclavicular, or cervical lymphadenopathy.   Cardiovascular Cardiovascular examination reveals -normal heart sounds, regular rate and rhythm with no murmurs and normal pedal pulses bilaterally.  Abdomen Inspection Inspection of the abdomen reveals - No Hernias. Skin - Scar - no surgical scars. Palpation/Percussion Palpation and Percussion of the abdomen reveal - Soft, Non Tender, No Rebound tenderness, No Rigidity (guarding) and No hepatosplenomegaly. Auscultation Auscultation of the abdomen  reveals - Bowel sounds normal.  Neurologic Neurologic evaluation reveals -alert and oriented x 3 with no impairment of recent or remote memory. Mental Status-Normal.  Musculoskeletal Normal Exam - Left-Upper Extremity Strength Normal and Lower Extremity Strength Normal. Normal Exam - Right-Upper Extremity Strength Normal and Lower Extremity Strength Normal.  Lymphatic Head & Neck  General Head & Neck Lymphatics: Bilateral - Description - Normal. Axillary  General Axillary Region: Bilateral - Description - Normal. Tenderness - Non Tender. Femoral & Inguinal  Generalized Femoral & Inguinal Lymphatics: Bilateral - Description - Normal. Tenderness - Non Tender.    Assessment & Plan PRIMARY CANCER OF RIGHT FEMALE BREAST (C50.911) Impression: The patient is about 7 months status post right mastectomy for breast cancer. Because this was her second cancer of the right breast in the last 2 years she would prefer to have a left prophylactic mastectomy and I think this is a reasonable thing to do. She would like to have a reconstruction performed at the same time and this has been coordinated with [name]. I have discussed with her in detail the risks and benefits of the operation as well as some of the technical aspects and she understands and wishes to proceed. We will plan to do this at [facility] regional since this is close to her home.

## 2018-02-09 NOTE — Anesthesia Post-op Follow-up Note (Signed)
Anesthesia QCDR form completed.        

## 2018-02-09 NOTE — Interval H&P Note (Signed)
History and Physical Interval Note:  02/09/2018 9:59 AM  Michelle Mcmillan  has presented today for surgery, with the diagnosis of HISTORY OF RIGHT BREAST CANCER  The various methods of treatment have been discussed with the patient and family. After consideration of risks, benefits and other options for treatment, the patient has consented to  Procedure(s): LEFT PROPHYLACTIC  MASTECTOMY (Left) BREAST RECONSTRUCTION WITH PLACEMENT OF TISSUE EXPANDER AND FLEX HD (ACELLULAR HYDRATED DERMIS) (Left) as a surgical intervention .  The patient's history has been reviewed, patient examined, no change in status, stable for surgery.  I have reviewed the patient's chart and labs.  Questions were answered to the patient's satisfaction.     Autumn Messing III

## 2018-02-09 NOTE — Discharge Instructions (Signed)
INSTRUCTIONS FOR AFTER BREAST SURGERY ° ° °You are getting ready to undergo breast surgery.  You will likely have some questions about what to expect following your operation.  The following information will help you and your family understand what to expect when you are discharged from the hospital.  Following these guidelines will help ensure a smooth recovery and reduce risks of complications.   °Postoperative instructions include information on: diet, wound care, medications and physical activity. ° °AFTER SURGERY °Expect to go home after the procedure.  In some cases, you may need to spend one night in the hospital for observation. ° °DIET °Breast surgery does not require a specific diet.  However, I have to mention that the healthier you eat the better your body can start healing. It is important to increasing your protein intake.  This means limiting the foods with sugar and carbohydrates.  Focus on vegetables and some meat.  If you have any liposuction during your procedure be sure to drink water.  If your urine is bright yellow, then it is concentrated, and you need to drink more water.  As a general rule after surgery, you should have 8 ounces of water every hour while awake.  If you find you are persistently nauseated or unable to take in liquids let us know.  NO TOBACCO USE or EXPOSURE.  This will slow your healing process and increase the risk of a wound. ° °WOUND CARE °You can shower the day after surgery if you don't have a drain.  Use fragrance free soap.  Dial, Dove and Ivory are usually mild on the skin. If you have a drain clean with baby wipes until the drain is removed.  If you have steri-strips / tape directly attached to your skin leave them in place. It is OK to get these wet.  No baths, pools or hot tubs for two weeks. °We close your incision to leave the smallest and best-looking scar. No ointment or creams on your incisions until given the go ahead.  Especially not Neosporin (Too many skin  reactions with this one).  A few weeks after surgery you can use Mederma and start massaging the scar. °We ask you to wear your binder or sports bra for the first 6 weeks around the clock, including while sleeping. This provides added comfort and helps reduce the fluid accumulation at the surgery site. ° °ACTIVITY °No heavy lifting until cleared by the doctor.  This usually means no more than a half-gallon of milk.  It is OK to walk and climb stairs. In fact, moving your legs is very important to decrease your risk of a blood clot.  It will also help keep you from getting deconditioned.  Every 1 to 2 hours get up and walk for 5 minutes. This will help with a quicker recovery back to normal.  Let pain be your guide so you don't do too much.  NO, you cannot do the spring cleaning and don't plan on taking care of anyone else.  This is your time for TLC.  °You will be more comfortable if you sleep and rest with your head elevated either with a few pillows under you or in a recliner.  No stomach sleeping for a few months. ° °WORK °Everyone returns to work at different times. As a rough guide, most people take at least 1 - 2 weeks off prior to returning to work. If you need documentation for your job, bring the forms to your postoperative follow   up visit.  DRIVING Arrange for someone to bring you home from the hospital.  You may be able to drive a few days after surgery but not while taking any narcotics or valium.  BOWEL MOVEMENTS Constipation can occur after anesthesia and while taking pain medication.  It is important to stay ahead for your comfort.  We recommend taking Milk of Magnesia (2 tablespoons; twice a day) while taking the pain pills.  SEROMA This is fluid your body tried to put in the surgical site.  This is normal but if it creates tight skinny skin let us know.  It usually decreases in a few weeks.  WHEN TO CALL Call your surgeon's office if any of the following occur:  Fever 101 degrees F or  greater  Excessive bleeding or fluid from the incision site.  Pain that increases over time without aid from the medications  Redness, warmth, or pus draining from incision sites  Persistent nausea or inability to take in liquids  Severe misshapen area that underwent the operation.

## 2018-02-10 ENCOUNTER — Other Ambulatory Visit: Payer: Self-pay | Admitting: Physician Assistant

## 2018-02-10 ENCOUNTER — Telehealth: Payer: Self-pay | Admitting: Plastic Surgery

## 2018-02-10 ENCOUNTER — Encounter: Payer: Self-pay | Admitting: General Surgery

## 2018-02-10 DIAGNOSIS — Z421 Encounter for breast reconstruction following mastectomy: Secondary | ICD-10-CM | POA: Diagnosis not present

## 2018-02-10 MED ORDER — HYDROCODONE-ACETAMINOPHEN 5-325 MG PO TABS: 1.0000 | ORAL_TABLET | Freq: Three times a day (TID) | ORAL | 0 refills | Status: DC | PRN

## 2018-02-10 MED ORDER — ONDANSETRON HCL 4 MG PO TABS: 4.0000 mg | ORAL_TABLET | Freq: Three times a day (TID) | ORAL | 1 refills | Status: DC | PRN

## 2018-02-10 MED ORDER — DIAZEPAM 2 MG PO TABS: 2.0000 mg | ORAL_TABLET | Freq: Two times a day (BID) | ORAL | 0 refills | Status: DC | PRN

## 2018-02-10 MED ORDER — CEPHALEXIN 500 MG PO CAPS: 500.0000 mg | ORAL_CAPSULE | Freq: Four times a day (QID) | ORAL | 0 refills | Status: AC

## 2018-02-10 NOTE — Discharge Summary (Signed)
Physician Discharge Summary  Patient ID: Michelle Mcmillan MRN: [id number] DOB/AGE: 1970-10-01 47 y.o.  Admit date: 02/09/2018 Discharge date: 02/10/2018  Admission Diagnoses:   Discharge Diagnoses:  Active Problems:   Acquired absence of left breast   Discharged Condition: good  Hospital Course: The patient is a 47 yrs old female here for treatment of breast cancer.  The patient had right breast cancer and underwent a mastectomy several months.  She decided she wanted a prophylactic mastectomy on the left.  She was taken to the operating room with Dr. Marlou Starks and myself for reconstruction and the mastectomy.  She underwent placement of a right breast expander with flex HD.  Dr. Marlou Starks did a left mastectomy.  Once complete the expander and flex HD was placed on the left side as well.  She has been doing well overnight and would like to go home.  She is voiding, walking and pain is controlled and tolerating food well.  Consults: None  Significant Diagnostic Studies: none  Treatments: surgery  Discharge Exam: Blood pressure 129/75, pulse 71, temperature (!) 97.5 F (36.4 C), temperature source Oral, resp. rate 16, height 5\' 9"  (1.753 m), weight (!) 150.1 kg, SpO2 98 %. General appearance: alert and cooperative  Disposition: Discharge disposition: 01-Home or Self Care        Allergies as of 02/10/2018      Reactions   Nitroglycerin Other (See Comments)   Headache, n/v.      Medication List    TAKE these medications   calcium-vitamin D 500-200 MG-UNIT tablet Take 1 tablet by mouth 2 (two) times daily.   cephALEXin 500 MG capsule Commonly known as:  KEFLEX Take 1 capsule (500 mg total) by mouth 4 (four) times daily for 5 days.   diazepam 2 MG tablet Commonly known as:  VALIUM Take 1 tablet (2 mg total) by mouth every 12 (twelve) hours as needed for muscle spasms.   gabapentin 300 MG capsule Commonly known as:  NEURONTIN Take 1 capsule (300 mg total) by mouth 2 (two)  times daily.   hydrochlorothiazide 25 MG tablet Commonly known as:  HYDRODIURIL Take 25 mg by mouth daily.   HYDROcodone-acetaminophen 5-325 MG tablet Commonly known as:  NORCO/VICODIN Take 1 tablet by mouth every 8 (eight) hours as needed for moderate pain.   ibuprofen 800 MG tablet Commonly known as:  ADVIL,MOTRIN Take 800 mg by mouth every 8 (eight) hours as needed (pain).   Iron 325 (65 Fe) MG Tabs Take 1 tablet (325 mg total) by mouth 2 (two) times daily.   letrozole 2.5 MG tablet Commonly known as:  FEMARA Take 1 tablet (2.5 mg total) by mouth daily.   naproxen sodium 220 MG tablet Commonly known as:  ALEVE Take 440 mg by mouth daily as needed (pain).   omeprazole 20 MG capsule Commonly known as:  PRILOSEC Take 1 capsule (20 mg total) by mouth daily.   ondansetron 4 MG tablet Commonly known as:  ZOFRAN Take 1 tablet (4 mg total) by mouth daily as needed for nausea or vomiting.   potassium chloride SA 20 MEQ tablet Commonly known as:  K-DUR,KLOR-CON Take 1 tablet (20 mEq total) by mouth 2 (two) times daily.   tetrahydrozoline 0.05 % ophthalmic solution Place 2 drops into both eyes daily.      Follow-up Information    Adaleena Mooers, Loel Lofty, DO In 1 week.   Specialty:  Plastic Surgery Contact information: [address] [place] [address] [phone]  Autumn Messing III, MD In 2 weeks.   Specialty:  General Surgery Contact information: [address] [place] [address] [phone]           Signed: Wallace Going 02/10/2018, 10:33 AM

## 2018-02-10 NOTE — Anesthesia Postprocedure Evaluation (Signed)
Anesthesia Post Note  Patient: Michelle Mcmillan  Procedure(s) Performed: LEFT PROPHYLACTIC  MASTECTOMY (Left Breast) BREAST RECONSTRUCTION WITH PLACEMENT OF TISSUE EXPANDER AND FLEX HD (ACELLULAR HYDRATED DERMIS) (Left )  Patient location during evaluation: PACU Anesthesia Type: General Level of consciousness: awake and alert Pain management: pain level controlled Vital Signs Assessment: post-procedure vital signs reviewed and stable Respiratory status: spontaneous breathing, nonlabored ventilation and respiratory function stable Cardiovascular status: blood pressure returned to baseline and stable Postop Assessment: no apparent nausea or vomiting Anesthetic complications: no     Last Vitals:  Vitals:   02/09/18 2047 02/10/18 0512  BP: 121/70 129/75  Pulse: 77 71  Resp: 16 16  Temp: 36.7 C (!) 36.4 C  SpO2: 94% 98%    Last Pain:  Vitals:   02/10/18 0512  TempSrc: Oral  PainSc:                  Durenda Hurt

## 2018-02-10 NOTE — Addendum Note (Signed)
Addended by: Wallace Going on: 02/10/2018 02:14 PM   Modules accepted: Orders

## 2018-02-10 NOTE — Telephone Encounter (Signed)
Patient called stating that her medications have not been filled by her current pharmacy and they are having trouble getting the correct medications. She is requesting to have her valium and Norco called to [facility] on [address] in [place]

## 2018-02-11 LAB — SURGICAL PATHOLOGY

## 2018-02-16 ENCOUNTER — Other Ambulatory Visit: Payer: Self-pay | Admitting: *Deleted

## 2018-02-16 MED ORDER — LETROZOLE 2.5 MG PO TABS: 2.5000 mg | ORAL_TABLET | Freq: Every day | ORAL | 3 refills | Status: DC

## 2018-02-17 ENCOUNTER — Encounter: Payer: Self-pay | Admitting: Plastic Surgery

## 2018-02-17 ENCOUNTER — Ambulatory Visit (INDEPENDENT_AMBULATORY_CARE_PROVIDER_SITE_OTHER): Payer: Medicaid Other | Admitting: Plastic Surgery

## 2018-02-17 VITALS — BP 128/76 | HR 88 | Ht 69.0 in | Wt 323.0 lb

## 2018-02-17 DIAGNOSIS — C50411 Malignant neoplasm of upper-outer quadrant of right female breast: Secondary | ICD-10-CM

## 2018-02-17 DIAGNOSIS — Z9013 Acquired absence of bilateral breasts and nipples: Secondary | ICD-10-CM | POA: Insufficient documentation

## 2018-02-17 DIAGNOSIS — Z9012 Acquired absence of left breast and nipple: Secondary | ICD-10-CM

## 2018-02-17 DIAGNOSIS — Z170 Estrogen receptor positive status [ER+]: Secondary | ICD-10-CM

## 2018-02-17 DIAGNOSIS — N6489 Other specified disorders of breast: Secondary | ICD-10-CM

## 2018-02-17 MED ORDER — KETOROLAC TROMETHAMINE 10 MG PO TABS: 10.0000 mg | ORAL_TABLET | Freq: Three times a day (TID) | ORAL | 0 refills | Status: DC | PRN

## 2018-02-17 NOTE — Progress Notes (Signed)
Subjective:    Patient ID: Michelle Mcmillan, female    DOB: 05/27/1970, 47 y.o.   MRN: [id number]  The patient is a 47-year-old black female here with her husband for a postoperative follow-up after breast reconstruction bilaterally last week.  Her original right mastectomy was with Dr. 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  She had a lumpectomy with radiation in 2017.  She underwent a left prophylactic mastectomy with Dr. Toth last week.  She is 5 feet 9 inches tall, weight is 331 pounds.  Her preop bra = 46 C/D cup.  Her drains are in place and draining well.  There is no hematoma noted on either side.  I was able to get out a little bit of serosanguineous drainage with some massage to the left.  We will leave the drain in for another week and plan on expanding the expanders next week.   Review of Systems  Constitutional: Positive for activity change.  HENT: Negative.   Eyes: Negative.   Respiratory: Negative.   Genitourinary: Negative.   Neurological: Negative.       Objective:   Physical Exam  Constitutional: She is oriented to person, place, and time. She appears well-developed and well-nourished.  HENT:  Head: Normocephalic and atraumatic.  Cardiovascular: Normal rate.  Pulmonary/Chest: Effort normal.  Neurological: She is alert and oriented to person, place, and time.  Psychiatric: She has a normal mood and affect. Her behavior is normal.       Assessment & Plan:  Postoperative breast asymmetry  Acquired absence of left breast  Malignant neoplasm of upper-outer quadrant of right breast in female, estrogen receptor positive (HCC)  S/P mastectomy, bilateral Plan to expand the expanders next week.  I also expressed to the patient that I have concerns about the right side.  She may not be able to expand very much due to the scarring. 

## 2018-02-17 NOTE — Addendum Note (Signed)
Addended by: Wallace Going on: 02/17/2018 02:38 PM   Modules accepted: Orders

## 2018-02-24 ENCOUNTER — Encounter: Payer: Self-pay | Admitting: Plastic Surgery

## 2018-02-24 ENCOUNTER — Ambulatory Visit (INDEPENDENT_AMBULATORY_CARE_PROVIDER_SITE_OTHER): Payer: Medicaid Other | Admitting: Plastic Surgery

## 2018-02-24 VITALS — BP 122/80 | HR 86 | Ht 69.0 in | Wt 325.0 lb

## 2018-02-24 DIAGNOSIS — Z9011 Acquired absence of right breast and nipple: Secondary | ICD-10-CM

## 2018-02-24 DIAGNOSIS — Z170 Estrogen receptor positive status [ER+]: Secondary | ICD-10-CM

## 2018-02-24 DIAGNOSIS — Z9013 Acquired absence of bilateral breasts and nipples: Secondary | ICD-10-CM

## 2018-02-24 DIAGNOSIS — N6489 Other specified disorders of breast: Secondary | ICD-10-CM

## 2018-02-24 DIAGNOSIS — C50411 Malignant neoplasm of upper-outer quadrant of right female breast: Secondary | ICD-10-CM

## 2018-02-24 DIAGNOSIS — Z9012 Acquired absence of left breast and nipple: Secondary | ICD-10-CM

## 2018-02-24 NOTE — Progress Notes (Signed)
   Subjective:    Patient ID: Michelle Mcmillan, female    DOB: 08-Jun-1970, 47 y.o.   MRN: [id number]  The patient is a 47 year old bf here with her husband for follow-up on her breast reconstruction.  She had a right mastectomy by Dr. Burt Knack after having a lumpectomy and radiation in 2017. Her drain output has been minimal and we were able to remove the drains.  She had invasive mammary carcinoma and ductal carcinoma in situ (ER /PR positive, HER-2 negative) Dr. Burt Knack is not in town anymore.  Dr. Marlou Starks in a prophylactic left mastectomy and we did the immediate reconstruction at the same time as we did the reconstruction on the right side with expander and Flex HD placement.  The left side is expanding nicely.  The right side is probably not going to expand much more after today.  She is 5 feet 9 inches tall, weight 331 pounds.       Review of Systems  Constitutional: Negative.  Negative for activity change.  HENT: Negative.   Eyes: Negative.   Respiratory: Negative.   Gastrointestinal: Negative.   Musculoskeletal: Negative.   Skin: Negative.        Objective:   Physical Exam  Constitutional: She appears well-developed and well-nourished.  HENT:  Head: Normocephalic and atraumatic.  Eyes: Pupils are equal, round, and reactive to light.  Cardiovascular: Normal rate.  Pulmonary/Chest: Effort normal.  Neurological: She is alert.  Psychiatric: She has a normal mood and affect. Her behavior is normal. Judgment and thought content normal.       Assessment & Plan:  Malignant neoplasm of upper-outer quadrant of right breast in female, estrogen receptor positive ([place])  Acquired absence of breast and absent nipple, right  Postoperative breast asymmetry  Acquired absence of left breast  S/P mastectomy, bilateral We placed injectable saline in the Expander using a sterile technique: Right: 50 cc  Left: 50 cc  The drains were removed bilaterally.

## 2018-03-06 ENCOUNTER — Encounter: Payer: Self-pay | Admitting: Plastic Surgery

## 2018-03-06 ENCOUNTER — Ambulatory Visit (INDEPENDENT_AMBULATORY_CARE_PROVIDER_SITE_OTHER): Payer: Medicaid Other | Admitting: Plastic Surgery

## 2018-03-06 VITALS — BP 124/80 | HR 80 | Resp 16 | Ht 69.0 in | Wt 319.0 lb

## 2018-03-06 DIAGNOSIS — C50411 Malignant neoplasm of upper-outer quadrant of right female breast: Secondary | ICD-10-CM

## 2018-03-06 DIAGNOSIS — Z9012 Acquired absence of left breast and nipple: Secondary | ICD-10-CM

## 2018-03-06 DIAGNOSIS — Z170 Estrogen receptor positive status [ER+]: Secondary | ICD-10-CM

## 2018-03-06 DIAGNOSIS — N6489 Other specified disorders of breast: Secondary | ICD-10-CM

## 2018-03-06 DIAGNOSIS — Z9013 Acquired absence of bilateral breasts and nipples: Secondary | ICD-10-CM

## 2018-03-06 NOTE — Progress Notes (Signed)
   Subjective:    Patient ID: [name], female    DOB: 11/17/1970, 47 y.o.   MRN: [id number]  Ms. Principato is a 47 year old black female here with her husband for another follow-up on her bilateral breast reconstruction.  She tolerated her fill last week very well.  The left breast has relaxed nicely.  The right breast has relaxed but not enough to be comfortable with a fill today.  There is no sign of hematoma or seroma.  Overall she is doing well and feeling much better.  She is continuing to lose some weight with the keto diet and is very excited about it.     Review of Systems  Constitutional: Negative.   HENT: Negative.   Eyes: Negative.   Respiratory: Negative.   Gastrointestinal: Negative.   Genitourinary: Negative.   Skin: Negative.       Objective:   Physical Exam  Constitutional: She is oriented to person, place, and time. She appears well-developed and well-nourished.  HENT:  Head: Normocephalic.  Cardiovascular: Normal rate.  Pulmonary/Chest: Effort normal.  Neurological: She is alert and oriented to person, place, and time.       Assessment & Plan:  Malignant neoplasm of upper-outer quadrant of right breast in female, estrogen receptor positive ([place])  Acquired absence of left breast  Postoperative breast asymmetry  S/P mastectomy, bilateral We placed injectable saline in the Expander using a sterile technique: Right: none Left: 100 cc for a total of 300 / 535 cc  (from last visit:  Right Left: 50 cc for a total of 200 / 535 cc )

## 2018-03-17 ENCOUNTER — Encounter: Payer: Self-pay | Admitting: Physician Assistant

## 2018-03-17 ENCOUNTER — Ambulatory Visit (INDEPENDENT_AMBULATORY_CARE_PROVIDER_SITE_OTHER): Payer: Medicaid Other | Admitting: Physician Assistant

## 2018-03-17 VITALS — BP 110/80 | HR 91 | Ht 69.0 in | Wt 321.5 lb

## 2018-03-17 DIAGNOSIS — Z9013 Acquired absence of bilateral breasts and nipples: Secondary | ICD-10-CM

## 2018-03-17 DIAGNOSIS — Z9012 Acquired absence of left breast and nipple: Secondary | ICD-10-CM

## 2018-03-17 NOTE — Progress Notes (Addendum)
  Subjective:     Patient ID: Michelle Mcmillan, female   DOB: [birth year], 47 y.o.   MRN: [id number]  HPI Very pleasant 47 year old African-American female presents to the clinic accompanied by her husband.  Patient is to follow-up after bilateral breast reconstruction.  Patient is overall doing well. Left breast tolerated fill last week.  No sign of hematoma or seroma.  Pt is continuing to be on the Keto diet and noting benefit.  She reports she has been able to go down a size in clothing.  Pt has radiation treatment of her right breast and it has not been amenable to filling.  Review of Systems  Constitutional: Negative.   Respiratory: Negative.   Cardiovascular: Negative.   Gastrointestinal: Negative.   Skin: Negative.   Psychiatric/Behavioral: Negative.        Objective:   Physical Exam Constitutional:      Appearance: She is obese.  Neck:     Musculoskeletal: Normal range of motion.  Pulmonary:     Breath sounds: Normal breath sounds.  Abdominal:     Palpations: Abdomen is soft.  Skin:    General: Skin is warm and dry.  Neurological:     Mental Status: She is alert and oriented to person, place, and time.  Psychiatric:        Mood and Affect: Mood normal.        Behavior: Behavior normal.        Thought Content: Thought content normal.        Judgment: Judgment normal.        Assessment:     S/p b/l mastectomy and expander placement    Plan:     We placed injectable saline in the Expander using a sterile technique: Right: none Left: 50 cc for a total of 250 / 535 cc  Dr Marla Roe and pt are discussing Latissimus flap placement for the right side.

## 2018-03-20 NOTE — Addendum Note (Signed)
Addended by: Wallace Going on: 03/20/2018 09:23 PM   Modules accepted: Orders

## 2018-03-27 ENCOUNTER — Ambulatory Visit (INDEPENDENT_AMBULATORY_CARE_PROVIDER_SITE_OTHER): Payer: Medicaid Other | Admitting: Physician Assistant

## 2018-03-27 ENCOUNTER — Encounter: Payer: Self-pay | Admitting: Physician Assistant

## 2018-03-27 VITALS — BP 120/82 | HR 72 | Ht 69.0 in | Wt 321.0 lb

## 2018-03-27 DIAGNOSIS — Z9013 Acquired absence of bilateral breasts and nipples: Secondary | ICD-10-CM

## 2018-03-27 NOTE — Progress Notes (Signed)
  Subjective:     Patient ID: Michelle Mcmillan, female   DOB: 05/03/1970, 47 y.o.   MRN: [id number]  HPI Very pleasant 47 year old African-American female presents to the clinic accompanied by her husband.  Patient is here in follow-up after bilateral breast reconstruction, which included mastectomy and expander placement.  Patient is doing well overall.  She reports only mild discomfort after her fill last week.  She does report when traveling she did notice some discomfort because of the seatbelt.  Even during the holidays overall patient was compliant with eating healthy.  Patient has recently been to [employer] and  fitted with a new bra with prosthesis..  Because of radiation treatment her right breast has not been amenable to filling.  Patient denies needing any further cancer treatment.  She and [name] have discussed possible placement of implant on the left and latissimus on the right in February.  Pt does feel like so has some decreased ROM of her upper extremities especially on the right.    Review of Systems  Constitutional: Negative.   Respiratory: Negative.   Cardiovascular: Negative.   Gastrointestinal: Negative.   Skin: Negative.   Neurological: Negative.   Psychiatric/Behavioral: Negative.        Objective:   Physical Exam Constitutional:      Appearance: She is obese.  Pulmonary:     Effort: Pulmonary effort is normal.  Abdominal:     Palpations: Abdomen is soft.  Skin:    General: Skin is warm and dry.  Neurological:     Mental Status: She is alert and oriented to person, place, and time.  Psychiatric:        Mood and Affect: Mood normal.        Behavior: Behavior normal.        Thought Content: Thought content normal.        Judgment: Judgment normal.        Assessment:     S/p bilateral Mastectomy and Expander placement    Plan:     We placed injectable saline in the Expander using a sterile technique: Right: none Left: 100 cc for a  total of 350 / 535 cc  Pt will return to clinic in 2-3 weeks Today I ordered PT

## 2018-03-31 ENCOUNTER — Other Ambulatory Visit: Payer: Self-pay | Admitting: Physician Assistant

## 2018-03-31 DIAGNOSIS — Z9013 Acquired absence of bilateral breasts and nipples: Secondary | ICD-10-CM

## 2018-04-10 ENCOUNTER — Encounter: Payer: Self-pay | Admitting: Plastic Surgery

## 2018-04-10 ENCOUNTER — Ambulatory Visit (INDEPENDENT_AMBULATORY_CARE_PROVIDER_SITE_OTHER): Payer: Medicaid Other | Admitting: Plastic Surgery

## 2018-04-10 VITALS — BP 132/91 | HR 69 | Temp 98.2°F | Ht 69.0 in | Wt 321.0 lb

## 2018-04-10 DIAGNOSIS — C50411 Malignant neoplasm of upper-outer quadrant of right female breast: Secondary | ICD-10-CM

## 2018-04-10 DIAGNOSIS — Z170 Estrogen receptor positive status [ER+]: Secondary | ICD-10-CM

## 2018-04-10 DIAGNOSIS — Z9013 Acquired absence of bilateral breasts and nipples: Secondary | ICD-10-CM

## 2018-04-10 DIAGNOSIS — Z9012 Acquired absence of left breast and nipple: Secondary | ICD-10-CM

## 2018-04-10 NOTE — Progress Notes (Addendum)
   Subjective:    Patient ID: Michelle Mcmillan, female    DOB: Sep 19, 1970, 48 y.o.   MRN: [id number]  The patient is a 48 year old female here for follow-up on her expansion.  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  There is no sign of infection.  There is no swelling or sign of hematoma.  Her skin is much more relaxed today than I have seen it in the past.  She would like to plan on reconstruction in March when she has a break from school.   Review of Systems  Constitutional: Negative.   HENT: Negative.   Eyes: Negative.   Respiratory: Negative.   Gastrointestinal: Negative.   Genitourinary: Negative.   Musculoskeletal: Negative.   Skin: Negative.        Objective:   Physical Exam Vitals signs and nursing note reviewed.  Constitutional:      Appearance: Normal appearance.  HENT:     Head: Normocephalic and atraumatic.  Cardiovascular:     Rate and Rhythm: Normal rate.  Abdominal:     General: Abdomen is flat.  Skin:    General: Skin is warm.  Neurological:     Mental Status: She is alert.  Psychiatric:        Mood and Affect: Mood normal.        Thought Content: Thought content normal.        Judgment: Judgment normal.           Assessment & Plan:  Malignant neoplasm of upper-outer quadrant of right breast in female, estrogen receptor positive ([place])  Acquired absence of left breast  S/P mastectomy, bilateral  We placed injectable saline in the Expander using a sterile technique: Right: 50 cc for a total of 250 / 535 cc Left: 50 cc for a total of 400 / 535 cc  We will plan for a latissimus muscle flap on the right side in March.  We will talk more about this at her next visit.

## 2018-04-24 ENCOUNTER — Ambulatory Visit (INDEPENDENT_AMBULATORY_CARE_PROVIDER_SITE_OTHER): Payer: Medicaid Other | Admitting: Plastic Surgery

## 2018-04-24 ENCOUNTER — Encounter: Payer: Self-pay | Admitting: Plastic Surgery

## 2018-04-24 VITALS — BP 132/96 | HR 71 | Temp 97.7°F | Ht 69.0 in | Wt 321.0 lb

## 2018-04-24 DIAGNOSIS — Z9013 Acquired absence of bilateral breasts and nipples: Secondary | ICD-10-CM

## 2018-04-24 DIAGNOSIS — C50411 Malignant neoplasm of upper-outer quadrant of right female breast: Secondary | ICD-10-CM

## 2018-04-24 DIAGNOSIS — Z9012 Acquired absence of left breast and nipple: Secondary | ICD-10-CM

## 2018-04-24 DIAGNOSIS — Z170 Estrogen receptor positive status [ER+]: Secondary | ICD-10-CM

## 2018-04-24 NOTE — Progress Notes (Signed)
   Subjective:    Patient ID: Michelle Mcmillan, female    DOB: October 21, 1970, 48 y.o.   MRN: [id number]  The patient is a 48 year old black female here for follow-up on her breast reconstruction and expanders.  She is back in nursing school and would like to arrange her right sided latissimus flap surgery for when she is on spring break.  The right is still tight but the incision is well-healed.  Her pain is minimal.  Left side skin is loose so ready for expansion today.  She has no complaints.  No complaints of pain either.   Review of Systems  Constitutional: Negative.   HENT: Negative.   Eyes: Negative.   Respiratory: Negative.   Gastrointestinal: Negative.   Endocrine: Negative.   Genitourinary: Negative.   Musculoskeletal: Negative.   Skin: Negative.   Psychiatric/Behavioral: Negative.        Objective:   Physical Exam Vitals signs and nursing note reviewed.  Constitutional:      Appearance: Normal appearance.  HENT:     Head: Normocephalic and atraumatic.     Nose: Nose normal.  Cardiovascular:     Rate and Rhythm: Normal rate.  Pulmonary:     Effort: Pulmonary effort is normal.  Abdominal:     General: Abdomen is flat. There is no distension.     Tenderness: There is no abdominal tenderness.  Skin:    General: Skin is warm.  Neurological:     General: No focal deficit present.     Mental Status: She is alert.       Assessment & Plan:  Malignant neoplasm of upper-outer quadrant of right breast in female, estrogen receptor positive (HCC)  S/P mastectomy, bilateral  Acquired absence of left breast We placed injectable saline in the Expander using a sterile technique: Right: Current total of 250 / 535 cc Left: 70 cc for a total of 470 / 535 cc  Pictures taken OR requested

## 2018-04-26 IMAGING — CT CT ABD-PELV W/ CM
2 of 5 series · 16 of 46 positions shown, 18 images · IV contrast (APPLIED)
Comparison: CT abdomen pelvis 05/16/2017.

CLINICAL DATA: Patient with severe diarrhea.  Abdominal pain.

EXAM:
CT ABDOMEN AND PELVIS WITH CONTRAST
TECHNIQUE: Multidetector CT imaging of the abdomen and pelvis was performed
using the standard protocol following bolus administration of
intravenous contrast.
CONTRAST:  125mL RMASS0-VEE IOPAMIDOL (RMASS0-VEE) INJECTION 61%

[Series 2: axial st · axial · 0.98mm/px · z∈[-806,-356]mm · 13 of 102 slices shown, 15 images]
[im 6/102  soft-tissue]
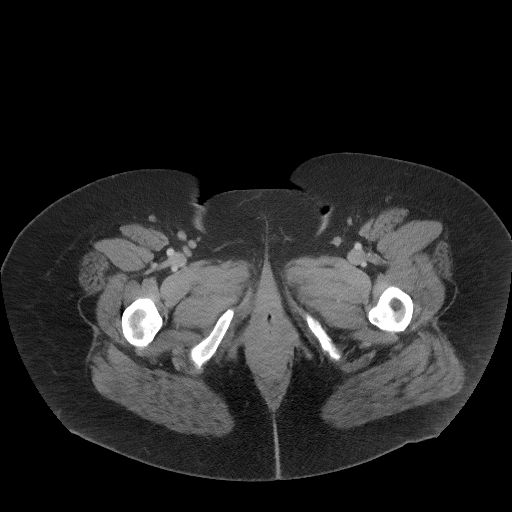
[im 6/102  bone]
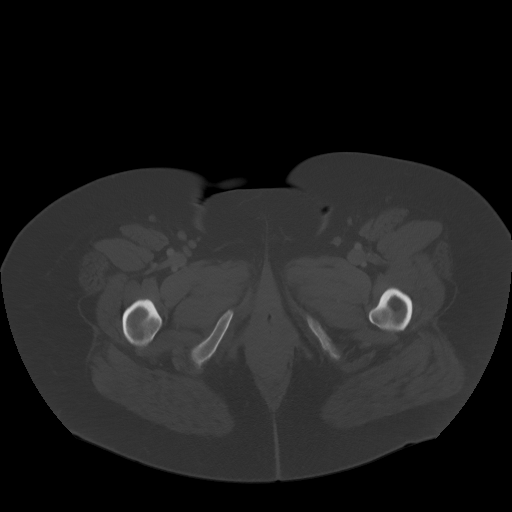
[im 16/102  soft-tissue]
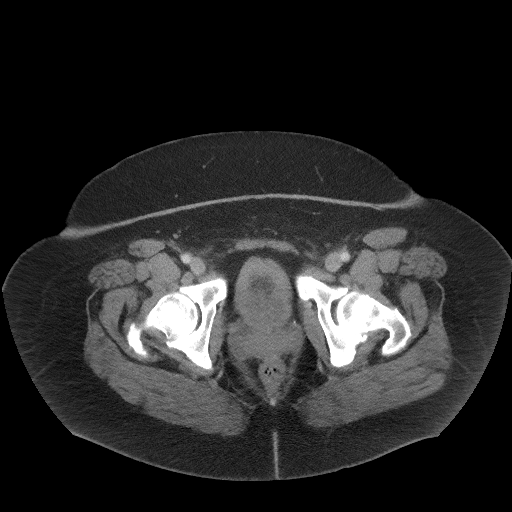
[im 22/102  soft-tissue]
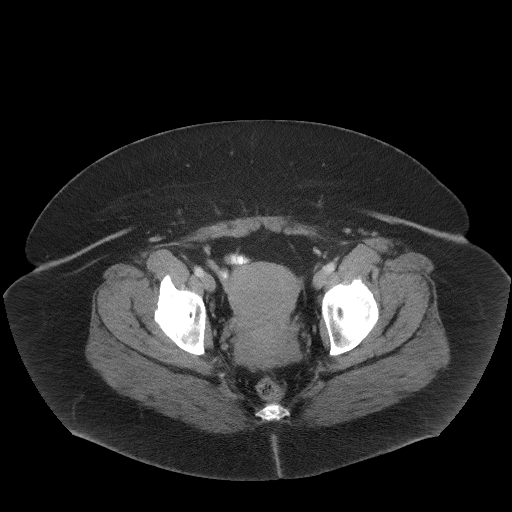
[im 27/102  soft-tissue]
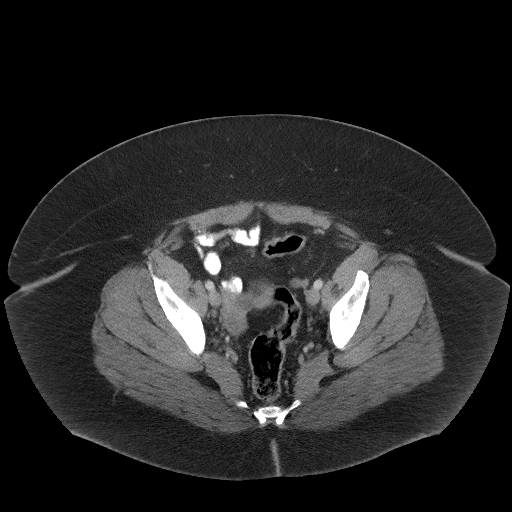
[im 38/102  soft-tissue]
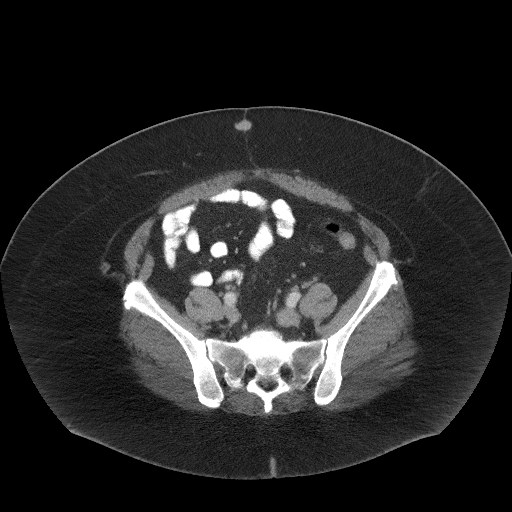
[im 43/102  soft-tissue]
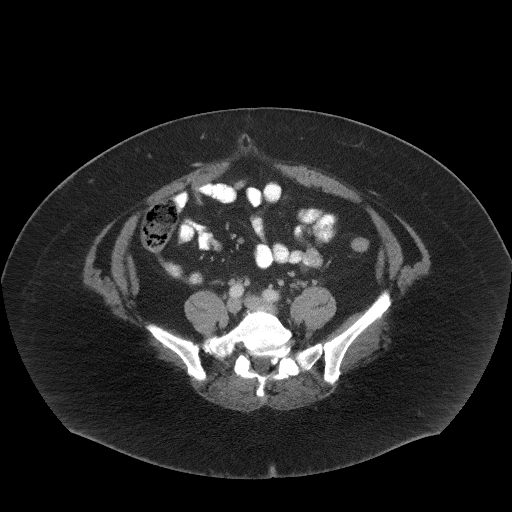
[im 54/102  soft-tissue]
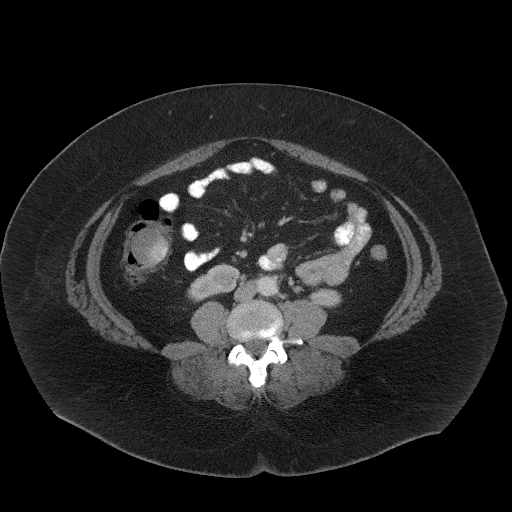
[im 59/102  soft-tissue]
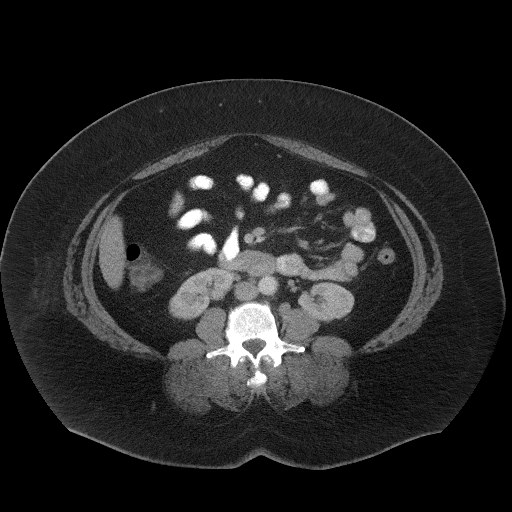
[im 64/102  soft-tissue]
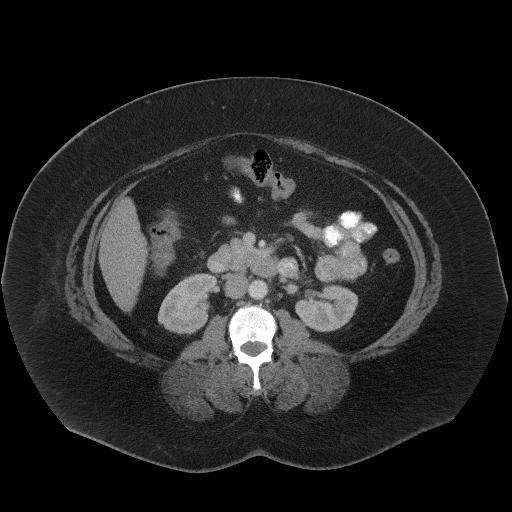
[im 64/102  bone]
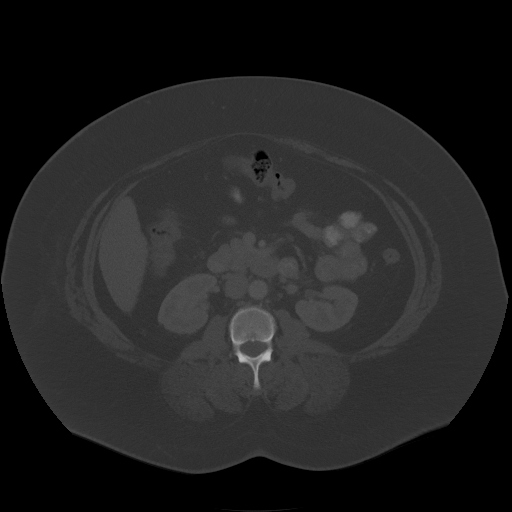
[im 75/102  soft-tissue]
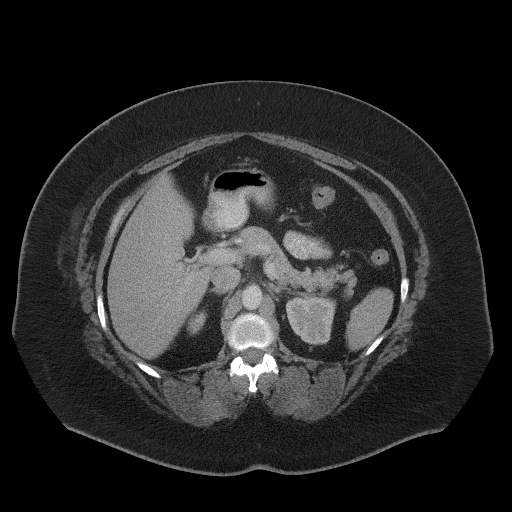
[im 80/102  soft-tissue]
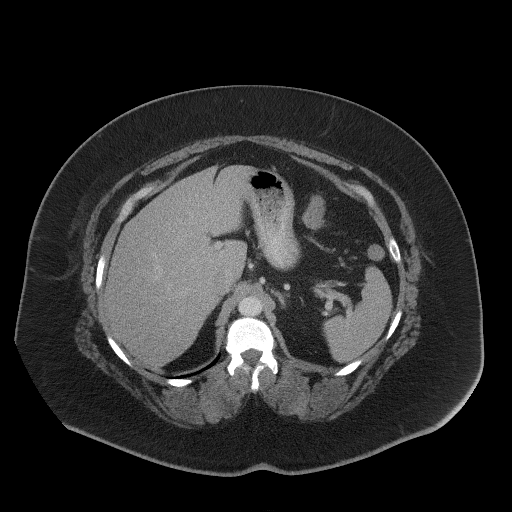
[im 86/102  soft-tissue]
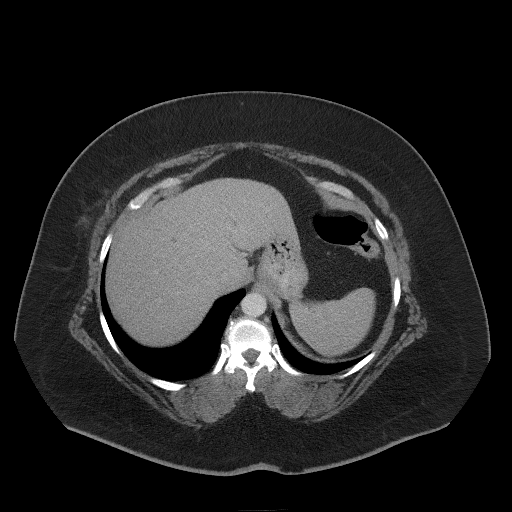
[im 96/102  soft-tissue]
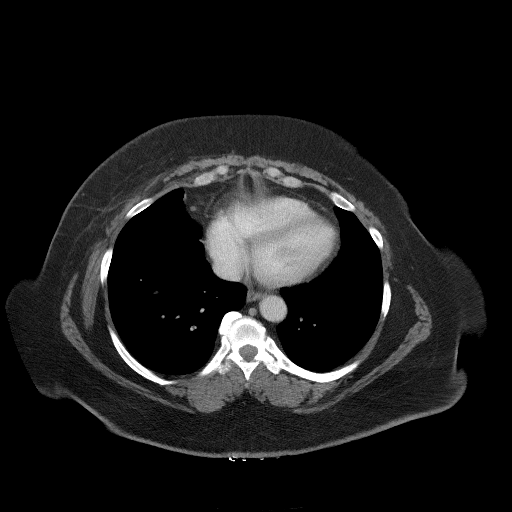

[Series 5: coronal st · coronal · 0.81mm/px · 3 of 117 slices shown]
[im 39/117  soft-tissue]
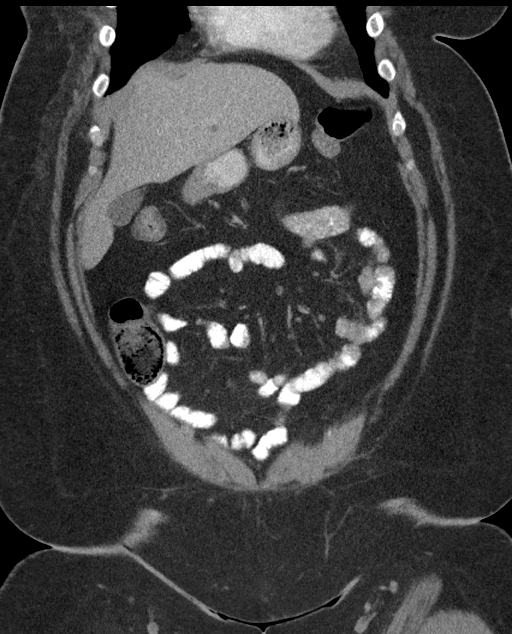
[im 52/117  soft-tissue]
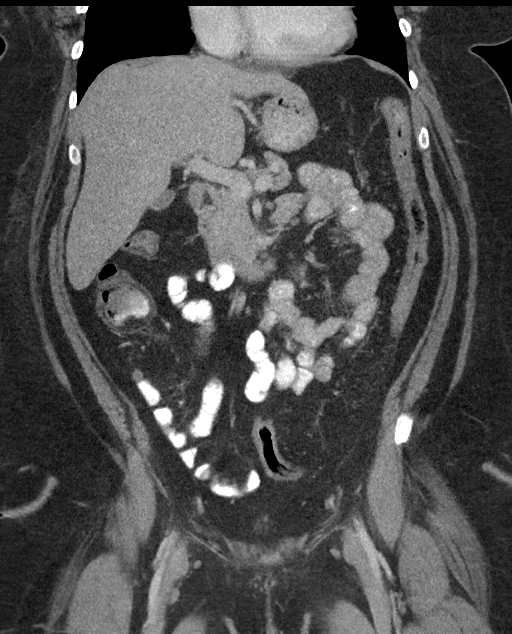
[im 65/117  soft-tissue]
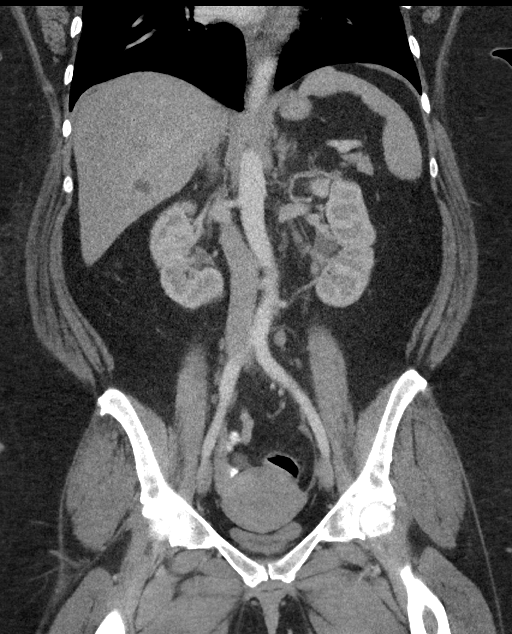

[16 of 46 positions shown; findings below may reference images not displayed]

FINDINGS: Lower chest: Normal heart size. Lung bases are clear. No pleural
effusion.

Hepatobiliary: Liver is normal in size and contour. Gallbladder is
unremarkable. Grossly unchanged 1.6 cm low-attenuation lesion right
hepatic lobe (image 30; series 2). Additional subcentimeter too
small to characterize low-attenuation lesions are similar (image 30;
series 2) (image 17; series 2).

Pancreas: Unremarkable

Spleen: Unremarkable

Adrenals/Urinary Tract: Adrenal glands are normal. Kidneys enhance
symmetrically with contrast. No hydronephrosis. Urinary bladder is
decompressed.

Stomach/Bowel: Colon is decompressed limiting evaluation however
there is suggestion of diffuse colonic wall thickening. No evidence
for bowel obstruction. Small hiatal hernia. Normal morphology of the
stomach. No free fluid or free intraperitoneal air.

Vascular/Lymphatic: Normal caliber abdominal aorta. Peripheral
calcified atherosclerotic plaque. No retroperitoneal
lymphadenopathy.

Reproductive: Uterus and adnexal structures are unremarkable. Right
ovarian cyst.

Other: None.

Musculoskeletal: Lumbar spine degenerative changes. No aggressive or
acute appearing osseous lesions. Bilateral L5 pars defects.
Incompletely visualized postmastectomy/surgical changes inferior
anterior right chest wall.
IMPRESSION: 1. Mild wall thickening of the colon as can be seen with colitis.
2. Re demonstrated 1.6 cm low-attenuation lesion within the right
hepatic lobe, indeterminate on current exam. Recommend further
evaluation with pre and post contrast-enhanced abdominal MRI.

## 2018-04-27 ENCOUNTER — Inpatient Hospital Stay: Payer: Medicaid Other | Admitting: Oncology

## 2018-05-08 ENCOUNTER — Encounter: Payer: Self-pay | Admitting: Physician Assistant

## 2018-05-08 ENCOUNTER — Ambulatory Visit (INDEPENDENT_AMBULATORY_CARE_PROVIDER_SITE_OTHER): Payer: Medicaid Other | Admitting: Physician Assistant

## 2018-05-08 VITALS — BP 136/92 | HR 82 | Temp 97.9°F | Ht 69.0 in | Wt 321.0 lb

## 2018-05-08 DIAGNOSIS — C50911 Malignant neoplasm of unspecified site of right female breast: Secondary | ICD-10-CM

## 2018-05-08 DIAGNOSIS — Z170 Estrogen receptor positive status [ER+]: Secondary | ICD-10-CM

## 2018-05-08 DIAGNOSIS — Z9013 Acquired absence of bilateral breasts and nipples: Secondary | ICD-10-CM

## 2018-05-08 NOTE — Progress Notes (Signed)
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   INTERVAL HISTORY: Patient returns to clinic today for routine evaluation.  She continues to have hot flashes with letrozole, but states these are improved.  Over the past several months she has had intermittent diarrhea associated with abdominal pain.  She also has had occasional fevers and night sweats.  She otherwise feels well.  She continues to have a mild peripheral neuropathy.  She has no other neurologic complaints. She has a good appetite and denies weight loss. She has no chest pain or shortness of breath.  She denies any nausea, vomiting, or constipation.  She has no urinary complaints.  Patient offers no further specific complaints today.  REVIEW OF SYSTEMS:   Review of Systems  Constitutional: Positive for diaphoresis. Negative for fever, malaise/fatigue and weight loss.  Respiratory: Negative.  Negative for cough and shortness of breath.   Cardiovascular: Negative.  Negative for chest pain and leg swelling.  Gastrointestinal: Positive for abdominal pain and diarrhea. Negative for constipation, nausea and vomiting.  Genitourinary: Negative.  Negative for dysuria.  Musculoskeletal: Negative.  Negative for joint pain and myalgias.  Skin: Negative.  Negative for rash.  Neurological: Positive for tingling and sensory change. Negative for focal weakness and weakness.  Psychiatric/Behavioral: The  patient is not nervous/anxious and does not have insomnia.     As per HPI. Otherwise, a complete review of systems is negative.  PAST MEDICAL HISTORY: Past Medical History:  Diagnosis Date  . Anemia    H/O  . Anemia   . Arthritis    RIGHT KNEE  . Breast cancer ([place]) 2017   right breast  . Cancer ([place])    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)  . Dyspnea   . GERD (gastroesophageal reflux disease)    NO MEDS  . Headache    migraines  . Hypertension   . Last menstrual period (LMP) > 10 days ago 08/2015  . Lower extremity edema   . Neuropathy   . Personal history of radiation therapy     PAST SURGICAL HISTORY: Past Surgical History:  Procedure Laterality Date  . BREAST BIOPSY Right 05/08/2017   invasive mamm. ca  . BREAST EXCISIONAL BIOPSY Right 06/15/15   breast cancer  . BREAST LUMPECTOMY WITH NEEDLE LOCALIZATION Right 06/15/2015   Procedure: BREAST LUMPECTOMY WITH NEEDLE LOCALIZATION;  Surgeon: Hubbard Robinson, MD;  Location: [facility] ORS;  Service: General;  Laterality: Right;  . BREAST RECONSTRUCTION WITH PLACEMENT OF TISSUE EXPANDER AND FLEX HD (ACELLULAR HYDRATED DERMIS) Left 02/09/2018   Procedure: BREAST RECONSTRUCTION WITH PLACEMENT OF TISSUE EXPANDER AND FLEX HD (ACELLULAR HYDRATED DERMIS);  Surgeon: Wallace Going, DO;  Location: [facility] ORS;  Service: Plastics;  Laterality: Left;  . BREAST SURGERY    . CESAREAN SECTION  1988   x1  . COLONOSCOPY WITH PROPOFOL N/A 01/23/2017   Procedure: COLONOSCOPY WITH PROPOFOL;  Surgeon: Lin Landsman, MD;  Location: [facility] ENDOSCOPY;  Service: Gastroenterology;  Laterality: N/A;  . ESOPHAGOGASTRODUODENOSCOPY (EGD) WITH PROPOFOL N/A 01/23/2017  Procedure: ESOPHAGOGASTRODUODENOSCOPY (EGD) WITH PROPOFOL;  Surgeon: Vanga, Rohini Reddy, MD;  Location: [facility] ENDOSCOPY;  Service: Gastroenterology;  Laterality: N/A;  . MASS EXCISION N/A 05/20/2016   Procedure: EXCISION CHEST WALL MASS;  Surgeon: Richard E Cooper, MD;  Location: [facility] ORS;   Service: General;  Laterality: N/A;  . SENTINEL NODE BIOPSY Right 06/15/2015   Procedure: SENTINEL NODE BIOPSY;  Surgeon: Catherine L Loflin, MD;  Location: [facility] ORS;  Service: General;  Laterality: Right;  . TOTAL MASTECTOMY Right 05/22/2017   Procedure: TOTAL MASTECTOMY;  Surgeon: Cooper, Richard E, MD;  Location: [facility] ORS;  Service: General;  Laterality: Right;  . TOTAL MASTECTOMY Left 02/09/2018   Procedure: LEFT PROPHYLACTIC  MASTECTOMY;  Surgeon: Toth, Paul III, MD;  Location: [facility] ORS;  Service: General;  Laterality: Left;  . TUBAL LIGATION  2004    FAMILY HISTORY Family History  Problem Relation Age of Onset  . Hypertension Mother   . Hypertension Father   . Hypertension Sister   . Breast cancer Neg Hx        ADVANCED DIRECTIVES:    HEALTH MAINTENANCE: Social History   Tobacco Use  . Smoking status: Never Smoker  . Smokeless tobacco: Never Used  Substance Use Topics  . Alcohol use: Not Currently    Comment: WINE ONCE MONTH  . Drug use: No    Allergies  Allergen Reactions  . Nitroglycerin Other (See Comments)    Headache, n/v.    Current Outpatient Medications  Medication Sig Dispense Refill  . Calcium Carb-Cholecalciferol (CALCIUM-VITAMIN D) 500-200 MG-UNIT tablet Take 1 tablet by mouth 2 (two) times daily.     . Ferrous Sulfate (IRON) 325 (65 Fe) MG TABS Take 1 tablet (325 mg total) by mouth 2 (two) times daily. 60 each 2  . gabapentin (NEURONTIN) 300 MG capsule Take 1 capsule (300 mg total) by mouth 2 (two) times daily. 60 capsule 2  . hydrochlorothiazide (HYDRODIURIL) 25 MG tablet Take 25 mg by mouth daily.    . ibuprofen (ADVIL,MOTRIN) 800 MG tablet Take 800 mg by mouth every 8 (eight) hours as needed (pain).    . letrozole (FEMARA) 2.5 MG tablet Take 1 tablet (2.5 mg total) by mouth daily. 30 tablet 3  . naproxen sodium (ALEVE) 220 MG tablet Take 440 mg by mouth daily as needed (pain).    . omeprazole (PRILOSEC) 20 MG capsule Take 1 capsule (20 mg total)  by mouth daily. 30 capsule 3  . potassium chloride SA (K-DUR,KLOR-CON) 20 MEQ tablet Take 1 tablet (20 mEq total) by mouth 2 (two) times daily. 30 tablet 1  . tetrahydrozoline 0.05 % ophthalmic solution Place 2 drops into both eyes daily.     No current facility-administered medications for this visit.     OBJECTIVE: Vitals:   05/12/18 1434  BP: 127/87  Pulse: 70  Temp: (!) 97.5 F (36.4 C)     Body mass index is 46.22 kg/m.    ECOG FS:0 - Asymptomatic  General: Well-developed, well-nourished, no acute distress. Eyes: Pink conjunctiva, anicteric sclera. HEENT: Normocephalic, moist mucous membranes. Breast: Patient has bilateral mastectomy.  Exam deferred today. Lungs: Clear to auscultation bilaterally. Heart: Regular rate and rhythm. No rubs, murmurs, or gallops. Abdomen: Soft, nontender, nondistended. No organomegaly noted, normoactive bowel sounds. Musculoskeletal: No edema, cyanosis, or clubbing. Neuro: Alert, answering all questions appropriately. Cranial nerves grossly intact. Skin: No rashes or petechiae noted. Psych: Normal affect.  LAB RESULTS:  Lab Results  Component Value Date   NA 137   02/09/2018   K 3.5 02/09/2018   CL 101 02/09/2018   CO2 26 02/09/2018   GLUCOSE 128 (H) 02/09/2018   BUN 19 02/09/2018   CREATININE 0.81 02/09/2018   CALCIUM 8.8 (L) 02/09/2018   PROT 6.9 10/21/2017   ALBUMIN 3.4 (L) 10/21/2017   AST 37 10/21/2017   ALT 48 (H) 10/21/2017   ALKPHOS 79 10/21/2017   BILITOT 0.4 10/21/2017   GFRNONAA >60 02/09/2018   GFRAA >60 02/09/2018    Lab Results  Component Value Date   WBC 10.4 02/09/2018   NEUTROABS 3.7 10/21/2017   HGB 11.6 (L) 02/09/2018   HCT 36.7 02/09/2018   MCV 76.5 (L) 02/09/2018   PLT 221 02/09/2018     STUDIES: No results found.  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 BRCA 1 and 2 negative, Oncotype DX score 17 which is  considered low risk.  PLAN:    1. 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  This occurred while patient was taking Aromasin.  Oncotype was not sent on her second breast cancer.  CT scans and bone scans reviewed independently with no evidence of metastatic disease confirming stage I malignancy.  Patient completed cycle 5 of Taxotere and Cytoxan on September 16, 2017.  Patient received 1 additional cycle secondary to a reaction she had during cycle 1 and did not complete treatment at that time.  Despite hot flashes, patient wishes to continue letrozole at this time.  Baseline bone mineral density on November 12, 2017 was reported as normal.  Repeat in August 2021.  Return to clinic in 6 months for routine evaluation. 2. Genetic testing: Patient was found to be BRCA 1 and 2 negative. She expressed understanding that although her genetic testing was negative, her children are at higher risk for breast cancer than the general population.   3.  Hot flashes: Likely secondary to letrozole.  We once again discussed discontinuing treatment and switching to anastrozole or possibly adding Effexor.  Patient declined both these options and wishes to continue letrozole as prescribed. 4.  Neuropathy: Chronic and unchanged.  Continue gabapentin as prescribed. 5.  Breast reconstruction: Patient has had bilateral mastectomy.  Continue reconstruction with plastic surgery as scheduled. 6.  Diarrhea/abdominal pain: Unclear etiology.  Patient had colonoscopy and EGD in October 2018.  I recommended she recontact her primary GI physician for further evaluation.  I spent a total of 30 minutes face-to-face with the patient of which greater than 50% of the visit was spent in counseling and coordination of care as detailed  above.  Patient expressed understanding and was in agreement with this plan. She also understands that She can call clinic at any time with any questions, concerns, or complaints.    Lloyd Huger, MD 05/14/18 5:29 PM

## 2018-05-08 NOTE — Progress Notes (Signed)
  Subjective:     Patient ID: Michelle Mcmillan, female   DOB: 15-Jan-1971, 48 y.o.   MRN: [id number]  HPI Very pleasant 48 year old female pt present to clinic in f/u.  Pt reports mild discomfort after fill on the left at her last OV.  She said it only lasted a day.  Pt reports doing well in her school classes.  Pt is accompanied by her very supportive husband.  Pt does report some nausea.  [name] believes it may be the result of the letrozole, which is a hormone based chemo medication.  Pt said she would like to be a "C" cup but understands more realistically she may be a "B"  Review of Systems  Constitutional: Negative.   Respiratory: Negative.   Cardiovascular: Negative.   Gastrointestinal: Negative.   Skin: Negative.   Psychiatric/Behavioral: Negative.        Objective:   Physical Exam Constitutional:      Appearance: Normal appearance.  Pulmonary:     Breath sounds: Normal breath sounds.  Abdominal:     Palpations: Abdomen is soft.  Skin:    General: Skin is warm and dry.  Neurological:     Mental Status: She is alert and oriented to person, place, and time.  Psychiatric:        Mood and Affect: Mood normal.        Behavior: Behavior normal.        Thought Content: Thought content normal.        Judgment: Judgment normal.   left breast tissue is soft and amenable to fill today in clinic     Assessment:     S/p mastectomy and expander placemen b/l    Plan:     We placed injectable saline in the Expander using a sterile technique: Right: 0 cc for a total of 250 / 535 cc Left: 50 cc for a total of 520 / 535 cc  We are still planning on the right latissimus flap surgery while pt is on Spring break.  Pt will return to clinic in 2 weeks.

## 2018-05-11 ENCOUNTER — Ambulatory Visit: Payer: Medicaid Other | Admitting: Oncology

## 2018-05-12 ENCOUNTER — Inpatient Hospital Stay: Payer: Medicaid Other

## 2018-05-12 ENCOUNTER — Inpatient Hospital Stay: Payer: Medicaid Other | Attending: Oncology | Admitting: Oncology

## 2018-05-12 ENCOUNTER — Other Ambulatory Visit: Payer: Self-pay

## 2018-05-12 VITALS — BP 127/87 | HR 70 | Temp 97.5°F | Ht 69.0 in | Wt 313.0 lb

## 2018-05-12 DIAGNOSIS — N951 Menopausal and female climacteric states: Secondary | ICD-10-CM | POA: Diagnosis not present

## 2018-05-12 DIAGNOSIS — R232 Flushing: Secondary | ICD-10-CM | POA: Insufficient documentation

## 2018-05-12 DIAGNOSIS — Z9013 Acquired absence of bilateral breasts and nipples: Secondary | ICD-10-CM | POA: Diagnosis not present

## 2018-05-12 DIAGNOSIS — C50411 Malignant neoplasm of upper-outer quadrant of right female breast: Secondary | ICD-10-CM | POA: Insufficient documentation

## 2018-05-12 DIAGNOSIS — R109 Unspecified abdominal pain: Secondary | ICD-10-CM

## 2018-05-12 DIAGNOSIS — Z791 Long term (current) use of non-steroidal anti-inflammatories (NSAID): Secondary | ICD-10-CM | POA: Insufficient documentation

## 2018-05-12 DIAGNOSIS — Z79811 Long term (current) use of aromatase inhibitors: Secondary | ICD-10-CM | POA: Insufficient documentation

## 2018-05-12 DIAGNOSIS — Z421 Encounter for breast reconstruction following mastectomy: Secondary | ICD-10-CM | POA: Insufficient documentation

## 2018-05-12 DIAGNOSIS — G629 Polyneuropathy, unspecified: Secondary | ICD-10-CM | POA: Diagnosis not present

## 2018-05-12 DIAGNOSIS — R197 Diarrhea, unspecified: Secondary | ICD-10-CM | POA: Diagnosis not present

## 2018-05-12 DIAGNOSIS — Z79899 Other long term (current) drug therapy: Secondary | ICD-10-CM | POA: Diagnosis not present

## 2018-05-12 DIAGNOSIS — Z9011 Acquired absence of right breast and nipple: Secondary | ICD-10-CM

## 2018-05-12 DIAGNOSIS — Z170 Estrogen receptor positive status [ER+]: Secondary | ICD-10-CM | POA: Diagnosis not present

## 2018-05-12 DIAGNOSIS — I10 Essential (primary) hypertension: Secondary | ICD-10-CM

## 2018-05-12 DIAGNOSIS — Z923 Personal history of irradiation: Secondary | ICD-10-CM

## 2018-05-12 NOTE — Progress Notes (Signed)
Survivorship Care Plan visit completed.  Treatment summary reviewed and given to patient.  ASCO answers booklet reviewed and given to patient.  CARE program and Cancer Transitions discussed with patient along with other resources cancer center offers to patients and caregivers.  Patient verbalized understanding.    

## 2018-05-12 NOTE — Progress Notes (Signed)
Patient is here today to follow up on her right breast cancer. Patient stated that for the past two months she has had diarrhea on/off. Patient stated that her last time she had diarrhea was last week. Patient stated that it all starts with abdominal pain and then her diarrhea. Patient also stated that she had terrible sweats at night time. Patient also stated that she has had right axilla/chest pain at times. Patient stated that she also has edema on bilateral ankle and feet.

## 2018-05-22 ENCOUNTER — Encounter: Payer: Self-pay | Admitting: Plastic Surgery

## 2018-05-22 ENCOUNTER — Ambulatory Visit (INDEPENDENT_AMBULATORY_CARE_PROVIDER_SITE_OTHER): Payer: Self-pay | Admitting: Plastic Surgery

## 2018-05-22 VITALS — BP 119/84 | HR 61 | Temp 97.9°F | Ht 69.0 in | Wt 314.0 lb

## 2018-05-22 DIAGNOSIS — Z9011 Acquired absence of right breast and nipple: Secondary | ICD-10-CM

## 2018-05-22 DIAGNOSIS — Z9013 Acquired absence of bilateral breasts and nipples: Secondary | ICD-10-CM

## 2018-05-22 DIAGNOSIS — Z9012 Acquired absence of left breast and nipple: Secondary | ICD-10-CM

## 2018-05-22 DIAGNOSIS — N6489 Other specified disorders of breast: Secondary | ICD-10-CM

## 2018-05-23 ENCOUNTER — Encounter: Payer: Self-pay | Admitting: Plastic Surgery

## 2018-05-23 MED ORDER — DIAZEPAM 2 MG PO TABS: 2.0000 mg | ORAL_TABLET | Freq: Four times a day (QID) | ORAL | 0 refills | Status: AC | PRN

## 2018-05-23 MED ORDER — HYDROCODONE-ACETAMINOPHEN 5-325 MG PO TABS: 1.0000 | ORAL_TABLET | Freq: Four times a day (QID) | ORAL | 0 refills | Status: AC | PRN

## 2018-05-23 MED ORDER — CEPHALEXIN 500 MG PO CAPS: 500.0000 mg | ORAL_CAPSULE | Freq: Four times a day (QID) | ORAL | 0 refills | Status: AC

## 2018-05-23 NOTE — H&P (View-Only) (Signed)
Patient ID: Michelle Mcmillan, female    DOB: 01/26/1971, 48 y.o.   MRN: [id number]   Chief Complaint  Patient presents with  . Follow-up    Latissimus flap with expander placement    Patient is a 48 year old black female here with her husband for history and physical for breast reconstruction.  She has bilateral expanders in place.  The left expander has been doing well with expansion.  She had a significant loss of skin on the right and therefore it was not able to expand.  The incisions are healed well and she has no complaints.  There is no sign of infection seroma or hematoma.   Review of Systems  Constitutional: Negative.  Negative for activity change and appetite change.  HENT: Negative.   Eyes: Negative.   Respiratory: Negative.  Negative for chest tightness and shortness of breath.   Cardiovascular: Negative.   Gastrointestinal: Negative.  Negative for abdominal pain.  Endocrine: Negative.   Genitourinary: Negative.   Musculoskeletal: Negative.  Negative for back pain.  Skin: Negative.  Negative for color change and wound.  Psychiatric/Behavioral: Negative.     Past Medical History:  Diagnosis Date  . Anemia    H/O  . Anemia   . Arthritis    RIGHT KNEE  . Breast cancer ([place]) 2017   right breast  . Cancer ([place])    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)  . Dyspnea   . GERD (gastroesophageal reflux disease)    NO MEDS  . Headache    migraines  . Hypertension   . Last menstrual period (LMP) > 10 days ago 08/2015  . Lower extremity edema   . Neuropathy   . Personal history of radiation therapy     Past Surgical History:  Procedure Laterality Date  . BREAST BIOPSY Right 05/08/2017   invasive mamm. ca  . BREAST EXCISIONAL BIOPSY Right 06/15/15   breast cancer  . BREAST LUMPECTOMY WITH NEEDLE LOCALIZATION Right 06/15/2015   Procedure: BREAST LUMPECTOMY WITH NEEDLE LOCALIZATION;  Surgeon: Hubbard Robinson, MD;  Location: [facility] ORS;  Service: General;  Laterality: Right;  .  BREAST RECONSTRUCTION WITH PLACEMENT OF TISSUE EXPANDER AND FLEX HD (ACELLULAR HYDRATED DERMIS) Left 02/09/2018   Procedure: BREAST RECONSTRUCTION WITH PLACEMENT OF TISSUE EXPANDER AND FLEX HD (ACELLULAR HYDRATED DERMIS);  Surgeon: Wallace Going, DO;  Location: [facility] ORS;  Service: Plastics;  Laterality: Left;  . BREAST SURGERY    . CESAREAN SECTION  1988   x1  . COLONOSCOPY WITH PROPOFOL N/A 01/23/2017   Procedure: COLONOSCOPY WITH PROPOFOL;  Surgeon: Lin Landsman, MD;  Location: [facility] ENDOSCOPY;  Service: Gastroenterology;  Laterality: N/A;  . ESOPHAGOGASTRODUODENOSCOPY (EGD) WITH PROPOFOL N/A 01/23/2017   Procedure: ESOPHAGOGASTRODUODENOSCOPY (EGD) WITH PROPOFOL;  Surgeon: Lin Landsman, MD;  Location: [facility] ENDOSCOPY;  Service: Gastroenterology;  Laterality: N/A;  . MASS EXCISION N/A 05/20/2016   Procedure: EXCISION CHEST WALL MASS;  Surgeon: Florene Glen, MD;  Location: [facility] ORS;  Service: General;  Laterality: N/A;  . SENTINEL NODE BIOPSY Right 06/15/2015   Procedure: SENTINEL NODE BIOPSY;  Surgeon: Hubbard Robinson, MD;  Location: [facility] ORS;  Service: General;  Laterality: Right;  . TOTAL MASTECTOMY Right 05/22/2017   Procedure: TOTAL MASTECTOMY;  Surgeon: Florene Glen, MD;  Location: [facility] ORS;  Service: General;  Laterality: Right;  . TOTAL MASTECTOMY Left 02/09/2018   Procedure: LEFT PROPHYLACTIC  MASTECTOMY;  Surgeon: Jovita Kussmaul, MD;  Location: [facility] ORS;  Service: General;  Laterality:  Left;  . TUBAL LIGATION  2004      Current Outpatient Medications:  .  Calcium Carb-Cholecalciferol (CALCIUM-VITAMIN D) 500-200 MG-UNIT tablet, Take 1 tablet by mouth 2 (two) times daily. , Disp: , Rfl:  .  Ferrous Sulfate (IRON) 325 (65 Fe) MG TABS, Take 1 tablet (325 mg total) by mouth 2 (two) times daily., Disp: 60 each, Rfl: 2 .  gabapentin (NEURONTIN) 300 MG capsule, Take 1 capsule (300 mg total) by mouth 2 (two) times daily., Disp: 60 capsule, Rfl: 2 .   hydrochlorothiazide (HYDRODIURIL) 25 MG tablet, Take 25 mg by mouth daily., Disp: , Rfl:  .  ibuprofen (ADVIL,MOTRIN) 800 MG tablet, Take 800 mg by mouth every 8 (eight) hours as needed (pain)., Disp: , Rfl:  .  letrozole (FEMARA) 2.5 MG tablet, Take 1 tablet (2.5 mg total) by mouth daily., Disp: 30 tablet, Rfl: 3 .  Liniments (BLUE-EMU SUPER STRENGTH) CREA, Apply 1 application topically daily as needed (pain)., Disp: , Rfl:  .  naproxen sodium (ALEVE) 220 MG tablet, Take 440 mg by mouth daily as needed (pain)., Disp: , Rfl:  .  omeprazole (PRILOSEC) 20 MG capsule, Take 1 capsule (20 mg total) by mouth daily., Disp: 30 capsule, Rfl: 3 .  potassium chloride SA (K-DUR,KLOR-CON) 20 MEQ tablet, Take 1 tablet (20 mEq total) by mouth 2 (two) times daily., Disp: 30 tablet, Rfl: 1 .  tetrahydrozoline 0.05 % ophthalmic solution, Place 2 drops into both eyes daily., Disp: , Rfl:    Objective:   Vitals:   05/22/18 1344  BP: 119/84  Pulse: 61  Temp: 97.9 F (36.6 C)  SpO2: 99%    Physical Exam Vitals signs and nursing note reviewed.  Constitutional:      Appearance: Normal appearance.  HENT:     Head: Normocephalic.     Nose: Nose normal. No congestion.  Cardiovascular:     Rate and Rhythm: Normal rate.     Pulses: Normal pulses.  Pulmonary:     Effort: Pulmonary effort is normal.  Abdominal:     General: Abdomen is flat. There is no distension.  Skin:    General: Skin is warm.  Neurological:     General: No focal deficit present.     Mental Status: She is alert.  Psychiatric:        Mood and Affect: Mood normal.        Thought Content: Thought content normal.        Judgment: Judgment normal.     Assessment & Plan:  S/P mastectomy, bilateral  Acquired absence of left breast  Postoperative breast asymmetry  Acquired absence of breast and absent nipple, right  Plan for right breast latissimus myocutaneous flap reconstruction.  Right: 250 / 535 cc Left: 520 / 535 cc  The  risk that can be encountered with breast surgery were discussed and include the following but not limited to these:  Breast asymmetry, fluid accumulation, firmness of the breast, skin loss, fat necrosis of the breast tissue, bleeding, infection, healing delay.  Deep vein thrombosis, cardiac and pulmonary complications are risks to any procedure. The implant can have a faulty position or one different from what you had desired.  The implant can have rippling, wrinkling, leakage or rupture. There are risks of anesthesia, changes to skin sensation and injury to nerves or blood vessels.  The muscle can be temporarily or permanently injured.  You may have an allergic reaction to tape, suture, glue, blood products which can result  in skin discoloration, swelling, pain, skin lesions, poor healing.  Any of these can lead to the need for revisonal surgery or stage procedures. Weight changes can effect the outcome over time. Future surgery may be required.  Regular examinations of the breast are required to evaluate the condition of your breasts and implants.  [place], DO

## 2018-05-23 NOTE — Progress Notes (Signed)
Patient ID: Michelle Mcmillan, female    DOB: [birth year], 48 y.o.   MRN: [id number]   Chief Complaint  Patient presents with  . Follow-up    Latissimus flap with expander placement    Patient is a 48 year old black female here with her husband for history and physical for breast reconstruction.  She has bilateral expanders in place.  The left expander has been doing well with expansion.  She had a significant loss of skin on the right and therefore it was not able to expand.  The incisions are healed well and she has no complaints.  There is no sign of infection seroma or hematoma.   Review of Systems  Constitutional: Negative.  Negative for activity change and appetite change.  HENT: Negative.   Eyes: Negative.   Respiratory: Negative.  Negative for chest tightness and shortness of breath.   Cardiovascular: Negative.   Gastrointestinal: Negative.  Negative for abdominal pain.  Endocrine: Negative.   Genitourinary: Negative.   Musculoskeletal: Negative.  Negative for back pain.  Skin: Negative.  Negative for color change and wound.  Psychiatric/Behavioral: Negative.     Past Medical History:  Diagnosis Date  . Anemia    H/O  . Anemia   . Arthritis    RIGHT KNEE  . Breast cancer ([place]) 2017   right breast  . Cancer ([place])    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)  . Dyspnea   . GERD (gastroesophageal reflux disease)    NO MEDS  . Headache    migraines  . Hypertension   . Last menstrual period (LMP) > 10 days ago 08/2015  . Lower extremity edema   . Neuropathy   . Personal history of radiation therapy     Past Surgical History:  Procedure Laterality Date  . BREAST BIOPSY Right 05/08/2017   invasive mamm. ca  . BREAST EXCISIONAL BIOPSY Right 06/15/15   breast cancer  . BREAST LUMPECTOMY WITH NEEDLE LOCALIZATION Right 06/15/2015   Procedure: BREAST LUMPECTOMY WITH NEEDLE LOCALIZATION;  Surgeon: Hubbard Robinson, MD;  Location: [facility] ORS;  Service: General;  Laterality: Right;  .  BREAST RECONSTRUCTION WITH PLACEMENT OF TISSUE EXPANDER AND FLEX HD (ACELLULAR HYDRATED DERMIS) Left 02/09/2018   Procedure: BREAST RECONSTRUCTION WITH PLACEMENT OF TISSUE EXPANDER AND FLEX HD (ACELLULAR HYDRATED DERMIS);  Surgeon: Wallace Going, DO;  Location: [facility];  Service: Plastics;  Laterality: Left;  . BREAST SURGERY    . CESAREAN SECTION  1988   x1  . COLONOSCOPY WITH PROPOFOL N/A 01/23/2017   Procedure: COLONOSCOPY WITH PROPOFOL;  Surgeon: Lin Landsman, MD;  Location: [facility] ENDOSCOPY;  Service: Gastroenterology;  Laterality: N/A;  . ESOPHAGOGASTRODUODENOSCOPY (EGD) WITH PROPOFOL N/A 01/23/2017   Procedure: ESOPHAGOGASTRODUODENOSCOPY (EGD) WITH PROPOFOL;  Surgeon: Lin Landsman, MD;  Location: [facility] ENDOSCOPY;  Service: Gastroenterology;  Laterality: N/A;  . MASS EXCISION N/A 05/20/2016   Procedure: EXCISION CHEST WALL MASS;  Surgeon: Florene Glen, MD;  Location: [facility] OR[facility];  Service: General;  Laterality: N/A;  . SENTINEL NODE BIOPSY Right 06/15/2015   Procedure: SENTINEL NODE BIOPSY;  Surgeon: Hubbard Robinson, MD;  Location: [facility];  Service: General;  Laterality: Right;  . TOTAL MASTECTOMY Right 05/22/2017   Procedure: TOTAL MASTECTOMY;  Surgeon: Florene Glen, MD;  Location: [facility];  Service: General;  Laterality: Right;  . TOTAL MASTECTOMY Left 02/09/2018   Procedure: LEFT PROPHYLACTIC  MASTECTOMY;  Surgeon: Jovita Kussmaul, MD;  Location: [facility] ORS;  Service: General;  Laterality:  Left;  . TUBAL LIGATION  2004      Current Outpatient Medications:  .  Calcium Carb-Cholecalciferol (CALCIUM-VITAMIN D) 500-200 MG-UNIT tablet, Take 1 tablet by mouth 2 (two) times daily. , Disp: , Rfl:  .  Ferrous Sulfate (IRON) 325 (65 Fe) MG TABS, Take 1 tablet (325 mg total) by mouth 2 (two) times daily., Disp: 60 each, Rfl: 2 .  gabapentin (NEURONTIN) 300 MG capsule, Take 1 capsule (300 mg total) by mouth 2 (two) times daily., Disp: 60 capsule, Rfl: 2 .   hydrochlorothiazide (HYDRODIURIL) 25 MG tablet, Take 25 mg by mouth daily., Disp: , Rfl:  .  ibuprofen (ADVIL,MOTRIN) 800 MG tablet, Take 800 mg by mouth every 8 (eight) hours as needed (pain)., Disp: , Rfl:  .  letrozole (FEMARA) 2.5 MG tablet, Take 1 tablet (2.5 mg total) by mouth daily., Disp: 30 tablet, Rfl: 3 .  Liniments (BLUE-EMU SUPER STRENGTH) CREA, Apply 1 application topically daily as needed (pain)., Disp: , Rfl:  .  naproxen sodium (ALEVE) 220 MG tablet, Take 440 mg by mouth daily as needed (pain)., Disp: , Rfl:  .  omeprazole (PRILOSEC) 20 MG capsule, Take 1 capsule (20 mg total) by mouth daily., Disp: 30 capsule, Rfl: 3 .  potassium chloride SA (K-DUR,KLOR-CON) 20 MEQ tablet, Take 1 tablet (20 mEq total) by mouth 2 (two) times daily., Disp: 30 tablet, Rfl: 1 .  tetrahydrozoline 0.05 % ophthalmic solution, Place 2 drops into both eyes daily., Disp: , Rfl:    Objective:   Vitals:   05/22/18 1344  BP: 119/84  Pulse: 61  Temp: 97.9 F (36.6 C)  SpO2: 99%    Physical Exam Vitals signs and nursing note reviewed.  Constitutional:      Appearance: Normal appearance.  HENT:     Head: Normocephalic.     Nose: Nose normal. No congestion.  Cardiovascular:     Rate and Rhythm: Normal rate.     Pulses: Normal pulses.  Pulmonary:     Effort: Pulmonary effort is normal.  Abdominal:     General: Abdomen is flat. There is no distension.  Skin:    General: Skin is warm.  Neurological:     General: No focal deficit present.     Mental Status: She is alert.  Psychiatric:        Mood and Affect: Mood normal.        Thought Content: Thought content normal.        Judgment: Judgment normal.     Assessment & Plan:  S/P mastectomy, bilateral  Acquired absence of left breast  Postoperative breast asymmetry  Acquired absence of breast and absent nipple, right  Plan for right breast latissimus myocutaneous flap reconstruction.  Right: 250 / 535 cc Left: 520 / 535 cc  The  risk that can be encountered with breast surgery were discussed and include the following but not limited to these:  Breast asymmetry, fluid accumulation, firmness of the breast, skin loss, fat necrosis of the breast tissue, bleeding, infection, healing delay.  Deep vein thrombosis, cardiac and pulmonary complications are risks to any procedure. The implant can have a faulty position or one different from what you had desired.  The implant can have rippling, wrinkling, leakage or rupture. There are risks of anesthesia, changes to skin sensation and injury to nerves or blood vessels.  The muscle can be temporarily or permanently injured.  You may have an allergic reaction to tape, suture, glue, blood products which can result  in skin discoloration, swelling, pain, skin lesions, poor healing.  Any of these can lead to the need for revisonal surgery or stage procedures. Weight changes can effect the outcome over time. Future surgery may be required.  Regular examinations of the breast are required to evaluate the condition of your breasts and implants.  [place], DO

## 2018-05-26 NOTE — Pre-Procedure Instructions (Signed)
Michelle Mcmillan  05/26/2018      [facility] [address]), [place] [address] [place] ([place]) [place] [address] Phone: [phone] Fax: [phone]    Your procedure is scheduled on June 01, 2018.  Report to [facility] Entr[facility] "Z"ES 923 AM.  Call this number if you have problems the morning of surgery:  [phone]   Remember:  Do not eat or drink after midnight.    Take these medicines the morning of surgery with A SIP OF WATER  Cephalexin (Keflex) Gabapentin (neurontin) Omeprazole (prilosec) Eye Drops Hydrocodone-acetaminophen (norco)-if needed Diazepam (Valium)-if needed  7 days prior to surgery STOP taking any Aspirin (unless otherwise instructed by your surgeon), Aleve, Naproxen, Ibuprofen, Motrin, Advil, Goody's, BC's, all herbal medications, fish oil, and all vitamins    Do not wear jewelry, make-up or nail polish.  Do not wear lotions, powders, or perfumes, or deodorant.  Do not shave 48 hours prior to surgery.    Do not bring valuables to the hospital.  [facility] is not responsible for any belongings or valuables.  Contacts, dentures or bridgework may not be worn into surgery.  Leave your suitcase in the car.  After surgery it may be brought to your room.  For patients admitted to the hospital, discharge time will be determined by your treatment team.  Patients discharged the day of surgery will not be allowed to drive home.    [place]- Preparing For Surgery  Before surgery, you can play an important role. Because skin is not sterile, your skin needs to be as free of germs as possible. You can reduce the number of germs on your skin by washing with CHG (chlorahexidine gluconate) Soap before surgery.  CHG is an antiseptic cleaner which kills germs and bonds with the skin to continue killing germs even after washing.    Oral Hygiene is also important to reduce your risk of infection.  Remember - BRUSH  YOUR TEETH THE MORNING OF SURGERY WITH YOUR REGULAR TOOTHPASTE  Please do not use if you have an allergy to CHG or antibacterial soaps. If your skin becomes reddened/irritated stop using the CHG.  Do not shave (including legs and underarms) for at least 48 hours prior to first CHG shower. It is OK to shave your face.  Please follow these instructions carefully.   1. Shower the NIGHT BEFORE SURGERY and the MORNING OF SURGERY with CHG.   2. If you chose to wash your hair, wash your hair first as usual with your normal shampoo.  3. After you shampoo, rinse your hair and body thoroughly to remove the shampoo.  4. Use CHG as you would any other liquid soap. You can apply CHG directly to the skin and wash gently with a scrungie or a clean washcloth.   5. Apply the CHG Soap to your body ONLY FROM THE NECK DOWN.  Do not use on open wounds or open sores. Avoid contact with your eyes, ears, mouth and genitals (private parts). Wash Face and genitals (private parts)  with your normal soap.  6. Wash thoroughly, paying special attention to the area where your surgery will be performed.  7. Thoroughly rinse your body with warm water from the neck down.  8. DO NOT shower/wash with your normal soap after using and rinsing off the CHG Soap.  9. Pat yourself dry with a CLEAN TOWEL.  10. Wear CLEAN PAJAMAS to bed the night before surgery, wear  comfortable clothes the morning of surgery  11. Place CLEAN SHEETS on your bed the night of your first shower and DO NOT SLEEP WITH PETS.  Day of Surgery:  Do not apply any deodorants/lotions.  Please wear clean clothes to the hospital/surgery center.   Remember to brush your teeth WITH YOUR REGULAR TOOTHPASTE.   Please read over the following fact sheets that you were given.

## 2018-05-27 ENCOUNTER — Encounter (HOSPITAL_COMMUNITY)
Admission: RE | Admit: 2018-05-27 | Discharge: 2018-05-27 | Disposition: A | Discharge status: Home / Self Care | Payer: Medicaid Other | Source: Ambulatory Visit | Attending: Plastic Surgery | Admitting: Plastic Surgery

## 2018-05-27 ENCOUNTER — Encounter (HOSPITAL_COMMUNITY): Payer: Self-pay

## 2018-05-27 ENCOUNTER — Other Ambulatory Visit: Payer: Self-pay

## 2018-05-27 DIAGNOSIS — Z01812 Encounter for preprocedural laboratory examination: Secondary | ICD-10-CM | POA: Insufficient documentation

## 2018-05-27 LAB — CBC
HEMATOCRIT: 32.1 % — AB (ref 36.0–46.0)
Hemoglobin: 10.9 g/dL — ABNORMAL LOW (ref 12.0–15.0)
MCH: 26.3 pg (ref 26.0–34.0)
MCHC: 34 g/dL (ref 30.0–36.0)
MCV: 77.5 fL — ABNORMAL LOW (ref 80.0–100.0)
PLATELETS: 857 10*3/uL — AB (ref 150–400)
RBC: 4.14 MIL/uL (ref 3.87–5.11)
RDW: 22.2 % — AB (ref 11.5–15.5)
WBC: 5.8 10*3/uL (ref 4.0–10.5)
nRBC: 0 % (ref 0.0–0.2)

## 2018-05-27 NOTE — Progress Notes (Signed)
PCP - Dr. Tomasa Hose Cardiologist - denies  Chest x-ray - N/A EKG - 02/03/18 Stress Test - denies ECHO - denies Cardiac Cath-denies   Sleep Study - has had sleep study- not diagnosed with OSA CPAP - does not use   Aspirin Instructions: Patient instructed to hold all Aspirin, NSAID's, herbal medications, fish oil and vitamins 7 days prior to surgery.   Anesthesia review:   Patient denies shortness of breath, fever, cough and chest pain at PAT appointment   Patient verbalized understanding of instructions that were given to them at the PAT appointment. Patient was also instructed that they will need to review over the PAT instructions again at home before surgery.

## 2018-05-29 MED ORDER — DEXTROSE 5 % IV SOLN
3.0000 g | INTRAVENOUS | Status: AC
  Administered 2018-06-01: 3 g via INTRAVENOUS
  Filled 2018-05-29: qty 3

## 2018-06-01 ENCOUNTER — Encounter (HOSPITAL_COMMUNITY): Payer: Self-pay

## 2018-06-01 ENCOUNTER — Other Ambulatory Visit: Payer: Self-pay

## 2018-06-01 ENCOUNTER — Inpatient Hospital Stay (HOSPITAL_COMMUNITY): Payer: Medicaid Other | Admitting: Certified Registered Nurse Anesthetist

## 2018-06-01 ENCOUNTER — Inpatient Hospital Stay (HOSPITAL_COMMUNITY)
Admission: RE | Admit: 2018-06-01 | Discharge: 2018-06-02 | DRG: 582 | Disposition: A | Discharge status: Home / Self Care | Payer: Medicaid Other | Attending: Plastic Surgery | Admitting: Plastic Surgery

## 2018-06-01 ENCOUNTER — Encounter (HOSPITAL_COMMUNITY)
Admission: RE | Disposition: A | Discharge status: Home / Self Care | Payer: Self-pay | Source: Home / Self Care | Attending: Plastic Surgery

## 2018-06-01 DIAGNOSIS — Z79811 Long term (current) use of aromatase inhibitors: Secondary | ICD-10-CM | POA: Diagnosis not present

## 2018-06-01 DIAGNOSIS — Z923 Personal history of irradiation: Secondary | ICD-10-CM | POA: Diagnosis not present

## 2018-06-01 DIAGNOSIS — K219 Gastro-esophageal reflux disease without esophagitis: Secondary | ICD-10-CM | POA: Diagnosis present

## 2018-06-01 DIAGNOSIS — Z170 Estrogen receptor positive status [ER+]: Secondary | ICD-10-CM | POA: Diagnosis not present

## 2018-06-01 DIAGNOSIS — Z9011 Acquired absence of right breast and nipple: Secondary | ICD-10-CM

## 2018-06-01 DIAGNOSIS — C50411 Malignant neoplasm of upper-outer quadrant of right female breast: Principal | ICD-10-CM | POA: Diagnosis present

## 2018-06-01 DIAGNOSIS — Z6841 Body Mass Index (BMI) 40.0 and over, adult: Secondary | ICD-10-CM | POA: Diagnosis not present

## 2018-06-01 DIAGNOSIS — Z853 Personal history of malignant neoplasm of breast: Secondary | ICD-10-CM

## 2018-06-01 DIAGNOSIS — Z9013 Acquired absence of bilateral breasts and nipples: Secondary | ICD-10-CM | POA: Diagnosis not present

## 2018-06-01 DIAGNOSIS — Z79899 Other long term (current) drug therapy: Secondary | ICD-10-CM

## 2018-06-01 DIAGNOSIS — G629 Polyneuropathy, unspecified: Secondary | ICD-10-CM | POA: Diagnosis present

## 2018-06-01 DIAGNOSIS — Z421 Encounter for breast reconstruction following mastectomy: Secondary | ICD-10-CM | POA: Diagnosis present

## 2018-06-01 DIAGNOSIS — I10 Essential (primary) hypertension: Secondary | ICD-10-CM | POA: Diagnosis present

## 2018-06-01 HISTORY — PX: LATISSIMUS FLAP TO BREAST: SHX5357

## 2018-06-01 HISTORY — PX: RECONSTRUCTION BREAST W/ LATISSIMUS DORSI FLAP: SUR1078

## 2018-06-01 LAB — BASIC METABOLIC PANEL
Anion gap: 10 (ref 5–15)
BUN: 18 mg/dL (ref 6–20)
CO2: 25 mmol/L (ref 22–32)
Calcium: 9.2 mg/dL (ref 8.9–10.3)
Chloride: 104 mmol/L (ref 98–111)
Creatinine, Ser: 0.97 mg/dL (ref 0.44–1.00)
GFR calc Af Amer: >60 mL/min (ref >60)
GFR calc non Af Amer: >60 mL/min (ref >60)
Glucose, Bld: 103 mg/dL — ABNORMAL HIGH (ref 70–99)
Potassium: 3.2 mmol/L — ABNORMAL LOW (ref 3.5–5.1)
Sodium: 139 mmol/L (ref 135–145)

## 2018-06-01 LAB — POCT PREGNANCY, URINE: Preg Test, Ur: NEGATIVE

## 2018-06-01 SURGERY — RECONSTRUCTION, BREAST, USING LATISSIMUS DORSI MYOCUTANEOUS FLAP
Anesthesia: General | Site: Breast | Laterality: Right

## 2018-06-01 MED ORDER — SODIUM CHLORIDE 0.9 % IV SOLN
INTRAVENOUS | Status: AC
  Filled 2018-06-01: qty 500000

## 2018-06-01 MED ORDER — MIDAZOLAM HCL 2 MG/2ML IJ SOLN
INTRAMUSCULAR | Status: AC
  Filled 2018-06-01: qty 2

## 2018-06-01 MED ORDER — FENTANYL CITRATE (PF) 100 MCG/2ML IJ SOLN
25.0000 ug | INTRAMUSCULAR | Status: DC | PRN
  Administered 2018-06-01: 25 ug via INTRAVENOUS
  Administered 2018-06-01 (×2): 50 ug via INTRAVENOUS

## 2018-06-01 MED ORDER — ONDANSETRON HCL 4 MG/2ML IJ SOLN
INTRAMUSCULAR | Status: AC
  Filled 2018-06-01: qty 2

## 2018-06-01 MED ORDER — LIDOCAINE 2% (20 MG/ML) 5 ML SYRINGE
INTRAMUSCULAR | Status: DC | PRN
  Administered 2018-06-01: 100 mg via INTRAVENOUS

## 2018-06-01 MED ORDER — ONDANSETRON HCL 4 MG/2ML IJ SOLN
INTRAMUSCULAR | Status: DC | PRN
  Administered 2018-06-01: 4 mg via INTRAVENOUS

## 2018-06-01 MED ORDER — DIPHENHYDRAMINE HCL 50 MG/ML IJ SOLN: 12.5000 mg | Freq: Four times a day (QID) | INTRAMUSCULAR | Status: DC | PRN

## 2018-06-01 MED ORDER — ONDANSETRON HCL 4 MG/2ML IJ SOLN: 4.0000 mg | Freq: Four times a day (QID) | INTRAMUSCULAR | Status: DC | PRN

## 2018-06-01 MED ORDER — OXYCODONE HCL 5 MG PO TABS
5.0000 mg | ORAL_TABLET | Freq: Once | ORAL | Status: AC | PRN
  Administered 2018-06-01: 5 mg via ORAL

## 2018-06-01 MED ORDER — LIDOCAINE 2% (20 MG/ML) 5 ML SYRINGE
INTRAMUSCULAR | Status: AC
  Filled 2018-06-01: qty 5

## 2018-06-01 MED ORDER — KCL IN DEXTROSE-NACL 20-5-0.45 MEQ/L-%-% IV SOLN
INTRAVENOUS | Status: DC
  Administered 2018-06-01 – 2018-06-02 (×2): via INTRAVENOUS
  Filled 2018-06-01 (×2): qty 1000

## 2018-06-01 MED ORDER — EVICEL 2 ML EX KIT
PACK | CUTANEOUS | Status: DC | PRN
  Administered 2018-06-01 (×2): 2 mL

## 2018-06-01 MED ORDER — POLYETHYLENE GLYCOL 3350 17 G PO PACK: 17.0000 g | PACK | Freq: Every day | ORAL | Status: DC | PRN

## 2018-06-01 MED ORDER — EVICEL 2 ML EX KIT
PACK | CUTANEOUS | Status: AC
  Filled 2018-06-01: qty 1

## 2018-06-01 MED ORDER — NAPROXEN 250 MG PO TABS: 500.0000 mg | ORAL_TABLET | Freq: Two times a day (BID) | ORAL | Status: DC | PRN

## 2018-06-01 MED ORDER — MIDAZOLAM HCL 5 MG/5ML IJ SOLN
INTRAMUSCULAR | Status: DC | PRN
  Administered 2018-06-01: 2 mg via INTRAVENOUS

## 2018-06-01 MED ORDER — FENTANYL CITRATE (PF) 250 MCG/5ML IJ SOLN
INTRAMUSCULAR | Status: AC
  Filled 2018-06-01: qty 5

## 2018-06-01 MED ORDER — HYDROCODONE-ACETAMINOPHEN 5-325 MG PO TABS
1.0000 | ORAL_TABLET | ORAL | Status: DC | PRN
  Administered 2018-06-01 (×2): 1 via ORAL
  Administered 2018-06-02: 2 via ORAL
  Filled 2018-06-01: qty 2
  Filled 2018-06-01: qty 1
  Filled 2018-06-01: qty 2

## 2018-06-01 MED ORDER — ONDANSETRON HCL 4 MG/2ML IJ SOLN: 4.0000 mg | Freq: Once | INTRAMUSCULAR | Status: DC | PRN

## 2018-06-01 MED ORDER — ROCURONIUM BROMIDE 50 MG/5ML IV SOSY
PREFILLED_SYRINGE | INTRAVENOUS | Status: AC
  Filled 2018-06-01: qty 5

## 2018-06-01 MED ORDER — FENTANYL CITRATE (PF) 100 MCG/2ML IJ SOLN
INTRAMUSCULAR | Status: AC
  Administered 2018-06-01: 50 ug via INTRAVENOUS
  Filled 2018-06-01: qty 2

## 2018-06-01 MED ORDER — SODIUM CHLORIDE 0.9 % IV SOLN
INTRAVENOUS | Status: DC | PRN
  Administered 2018-06-01: 500 mL

## 2018-06-01 MED ORDER — SENNA 8.6 MG PO TABS
1.0000 | ORAL_TABLET | Freq: Two times a day (BID) | ORAL | Status: DC
  Administered 2018-06-01: 8.6 mg via ORAL
  Filled 2018-06-01: qty 1

## 2018-06-01 MED ORDER — THROMBIN 5000 UNITS EX SOLR
CUTANEOUS | Status: AC
  Filled 2018-06-01: qty 5000

## 2018-06-01 MED ORDER — OXYCODONE HCL 5 MG/5ML PO SOLN: 5.0000 mg | Freq: Once | ORAL | Status: AC | PRN

## 2018-06-01 MED ORDER — DIPHENHYDRAMINE HCL 12.5 MG/5ML PO ELIX: 12.5000 mg | ORAL_SOLUTION | Freq: Four times a day (QID) | ORAL | Status: DC | PRN

## 2018-06-01 MED ORDER — PROPOFOL 10 MG/ML IV BOLUS
INTRAVENOUS | Status: DC | PRN
  Administered 2018-06-01: 200 mg via INTRAVENOUS

## 2018-06-01 MED ORDER — THROMBIN (RECOMBINANT) 20000 UNITS EX SOLR
CUTANEOUS | Status: AC
  Filled 2018-06-01: qty 20000

## 2018-06-01 MED ORDER — ONDANSETRON 4 MG PO TBDP
4.0000 mg | ORAL_TABLET | Freq: Four times a day (QID) | ORAL | Status: DC | PRN
  Administered 2018-06-01: 4 mg via ORAL
  Filled 2018-06-01: qty 1

## 2018-06-01 MED ORDER — FENTANYL CITRATE (PF) 250 MCG/5ML IJ SOLN
INTRAMUSCULAR | Status: DC | PRN
  Administered 2018-06-01: 50 ug via INTRAVENOUS
  Administered 2018-06-01: 100 ug via INTRAVENOUS
  Administered 2018-06-01 (×7): 50 ug via INTRAVENOUS

## 2018-06-01 MED ORDER — ACETAMINOPHEN 10 MG/ML IV SOLN
INTRAVENOUS | Status: AC
  Filled 2018-06-01: qty 100

## 2018-06-01 MED ORDER — DEXAMETHASONE SODIUM PHOSPHATE 10 MG/ML IJ SOLN
INTRAMUSCULAR | Status: DC | PRN
  Administered 2018-06-01: 10 mg via INTRAVENOUS

## 2018-06-01 MED ORDER — SUGAMMADEX SODIUM 200 MG/2ML IV SOLN
INTRAVENOUS | Status: DC | PRN
  Administered 2018-06-01: 300 mg via INTRAVENOUS

## 2018-06-01 MED ORDER — SUGAMMADEX SODIUM 500 MG/5ML IV SOLN
INTRAVENOUS | Status: AC
  Filled 2018-06-01: qty 5

## 2018-06-01 MED ORDER — CEFAZOLIN SODIUM-DEXTROSE 2-4 GM/100ML-% IV SOLN
2.0000 g | Freq: Three times a day (TID) | INTRAVENOUS | Status: DC
  Administered 2018-06-01 – 2018-06-02 (×3): 2 g via INTRAVENOUS
  Filled 2018-06-01 (×4): qty 100

## 2018-06-01 MED ORDER — THROMBIN (RECOMBINANT) 5000 UNITS EX SOLR
CUTANEOUS | Status: AC
  Filled 2018-06-01: qty 5000

## 2018-06-01 MED ORDER — PROPOFOL 10 MG/ML IV BOLUS
INTRAVENOUS | Status: AC
  Filled 2018-06-01: qty 20

## 2018-06-01 MED ORDER — THROMBIN 5000 UNITS EX SOLR
CUTANEOUS | Status: DC | PRN
  Administered 2018-06-01: 5000 [IU] via TOPICAL

## 2018-06-01 MED ORDER — DEXAMETHASONE SODIUM PHOSPHATE 10 MG/ML IJ SOLN
INTRAMUSCULAR | Status: AC
  Filled 2018-06-01: qty 1

## 2018-06-01 MED ORDER — CHLORHEXIDINE GLUCONATE CLOTH 2 % EX PADS: 6.0000 | MEDICATED_PAD | Freq: Once | CUTANEOUS | Status: DC

## 2018-06-01 MED ORDER — ROCURONIUM BROMIDE 50 MG/5ML IV SOSY
PREFILLED_SYRINGE | INTRAVENOUS | Status: DC | PRN
  Administered 2018-06-01: 50 mg via INTRAVENOUS
  Administered 2018-06-01: 30 mg via INTRAVENOUS
  Administered 2018-06-01 (×2): 20 mg via INTRAVENOUS
  Administered 2018-06-01: 30 mg via INTRAVENOUS

## 2018-06-01 MED ORDER — ACETAMINOPHEN 500 MG PO TABS
500.0000 mg | ORAL_TABLET | Freq: Four times a day (QID) | ORAL | Status: DC
  Administered 2018-06-01: 500 mg via ORAL
  Filled 2018-06-01: qty 1

## 2018-06-01 MED ORDER — FENTANYL CITRATE (PF) 100 MCG/2ML IJ SOLN
INTRAMUSCULAR | Status: AC
  Filled 2018-06-01: qty 2

## 2018-06-01 MED ORDER — BUPIVACAINE-EPINEPHRINE 0.5% -1:200000 IJ SOLN
INTRAMUSCULAR | Status: DC | PRN
  Administered 2018-06-01: 26 mL

## 2018-06-01 MED ORDER — PHENYLEPHRINE 40 MCG/ML (10ML) SYRINGE FOR IV PUSH (FOR BLOOD PRESSURE SUPPORT)
PREFILLED_SYRINGE | INTRAVENOUS | Status: DC | PRN
  Administered 2018-06-01 (×4): 40 ug via INTRAVENOUS

## 2018-06-01 MED ORDER — PHENYLEPHRINE 40 MCG/ML (10ML) SYRINGE FOR IV PUSH (FOR BLOOD PRESSURE SUPPORT)
PREFILLED_SYRINGE | INTRAVENOUS | Status: AC
  Filled 2018-06-01: qty 10

## 2018-06-01 MED ORDER — HYDROMORPHONE HCL 1 MG/ML IJ SOLN
1.0000 mg | INTRAMUSCULAR | Status: DC | PRN
  Administered 2018-06-01 (×2): 1 mg via INTRAVENOUS
  Filled 2018-06-01 (×2): qty 1

## 2018-06-01 MED ORDER — ACETAMINOPHEN 10 MG/ML IV SOLN
INTRAVENOUS | Status: DC | PRN
  Administered 2018-06-01: 1000 mg via INTRAVENOUS

## 2018-06-01 MED ORDER — LACTATED RINGERS IV SOLN
INTRAVENOUS | Status: DC | PRN
  Administered 2018-06-01 (×2): via INTRAVENOUS

## 2018-06-01 MED ORDER — OXYCODONE HCL 5 MG PO TABS
ORAL_TABLET | ORAL | Status: AC
  Administered 2018-06-01: 5 mg via ORAL
  Filled 2018-06-01: qty 1

## 2018-06-01 MED ORDER — DIAZEPAM 2 MG PO TABS
2.0000 mg | ORAL_TABLET | Freq: Two times a day (BID) | ORAL | Status: DC | PRN
  Administered 2018-06-01: 2 mg via ORAL
  Filled 2018-06-01: qty 1

## 2018-06-01 MED ORDER — HYDROCODONE-ACETAMINOPHEN 5-325 MG PO TABS
ORAL_TABLET | ORAL | Status: AC
  Filled 2018-06-01: qty 1

## 2018-06-01 SURGICAL SUPPLY — 55 items
BAG DECANTER FOR FLEXI CONT (MISCELLANEOUS) ×3 IMPLANT
BINDER BREAST XXLRG (GAUZE/BANDAGES/DRESSINGS) ×3 IMPLANT
BIOPATCH RED 1 DISK 7.0 (GAUZE/BANDAGES/DRESSINGS) ×6 IMPLANT
BIOPATCH RED 1IN DISK 7.0MM (GAUZE/BANDAGES/DRESSINGS) ×3
BLADE SURG 15 STRL LF DISP TIS (BLADE) ×2 IMPLANT
BLADE SURG 15 STRL SS (BLADE) ×4
CANISTER SUCT 3000ML PPV (MISCELLANEOUS) ×3 IMPLANT
CHLORAPREP W/TINT 26ML (MISCELLANEOUS) ×6 IMPLANT
CLIP LIGATING EXTRA MED SLVR (CLIP) ×3 IMPLANT
CONNECTOR 5 IN 1 STRAIGHT STRL (MISCELLANEOUS) ×3 IMPLANT
COVER SURGICAL LIGHT HANDLE (MISCELLANEOUS) ×3 IMPLANT
COVER WAND RF STERILE (DRAPES) ×3 IMPLANT
DERMABOND ADVANCED (GAUZE/BANDAGES/DRESSINGS) ×12
DERMABOND ADVANCED .7 DNX12 (GAUZE/BANDAGES/DRESSINGS) ×6 IMPLANT
DRAIN CHANNEL 19F RND (DRAIN) ×9 IMPLANT
DRAPE HALF SHEET 40X57 (DRAPES) ×6 IMPLANT
DRAPE ORTHO SPLIT 77X108 STRL (DRAPES) ×8
DRAPE SURG ORHT 6 SPLT 77X108 (DRAPES) ×4 IMPLANT
DRAPE WARM FLUID 44X44 (DRAPE) ×3 IMPLANT
DRSG MEPILEX BORDER 4X4 (GAUZE/BANDAGES/DRESSINGS) ×3 IMPLANT
DRSG MEPILEX BORDER 4X8 (GAUZE/BANDAGES/DRESSINGS) ×3 IMPLANT
ELECT BLADE 4.0 EZ CLEAN MEGAD (MISCELLANEOUS) ×3
ELECT CAUTERY BLADE 6.4 (BLADE) ×3 IMPLANT
ELECT REM PT RETURN 9FT ADLT (ELECTROSURGICAL) ×3
ELECTRODE BLDE 4.0 EZ CLN MEGD (MISCELLANEOUS) ×1 IMPLANT
ELECTRODE REM PT RTRN 9FT ADLT (ELECTROSURGICAL) ×1 IMPLANT
EVACUATOR SILICONE 100CC (DRAIN) ×9 IMPLANT
GLOVE BIO SURGEON STRL SZ 6.5 (GLOVE) ×12 IMPLANT
GLOVE BIO SURGEONS STRL SZ 6.5 (GLOVE) ×6
GOWN STRL REUS W/ TWL LRG LVL3 (GOWN DISPOSABLE) ×4 IMPLANT
GOWN STRL REUS W/TWL LRG LVL3 (GOWN DISPOSABLE) ×8
KIT BASIN OR (CUSTOM PROCEDURE TRAY) ×3 IMPLANT
NEEDLE 22X1 1/2 (OR ONLY) (NEEDLE) ×3 IMPLANT
NS IRRIG 1000ML POUR BTL (IV SOLUTION) ×6 IMPLANT
PACK GENERAL/GYN (CUSTOM PROCEDURE TRAY) ×3 IMPLANT
PAD ABD 8X10 STRL (GAUZE/BANDAGES/DRESSINGS) ×6 IMPLANT
PAD ARMBOARD 7.5X6 YLW CONV (MISCELLANEOUS) ×9 IMPLANT
PENCIL BUTTON HOLSTER BLD 10FT (ELECTRODE) ×3 IMPLANT
SPONGE LAP 18X18 RF (DISPOSABLE) ×6 IMPLANT
STAPLER VISISTAT 35W (STAPLE) ×3 IMPLANT
SUT ETHILON 2 0 FS 18 (SUTURE) ×6 IMPLANT
SUT MNCRL AB 3-0 PS2 18 (SUTURE) ×6 IMPLANT
SUT MNCRL AB 4-0 PS2 18 (SUTURE) ×6 IMPLANT
SUT MON AB 3-0 SH 27 (SUTURE) ×16
SUT MON AB 3-0 SH27 (SUTURE) ×8 IMPLANT
SUT MON AB 5-0 PS2 18 (SUTURE) ×9 IMPLANT
SUT SILK 2 0 PERMA HAND 18 BK (SUTURE) ×9 IMPLANT
SUT VIC AB 3-0 SH 8-18 (SUTURE) ×3 IMPLANT
SYR CONTROL 10ML LL (SYRINGE) ×3 IMPLANT
TOWEL GREEN STERILE (TOWEL DISPOSABLE) ×3 IMPLANT
TOWEL GREEN STERILE FF (TOWEL DISPOSABLE) ×3 IMPLANT
TRAY FOLEY MTR SLVR 14FR STAT (SET/KITS/TRAYS/PACK) ×3 IMPLANT
TUBE CONNECTING 12'X1/4 (SUCTIONS) ×1
TUBE CONNECTING 12X1/4 (SUCTIONS) ×2 IMPLANT
YANKAUER SUCT BULB TIP NO VENT (SUCTIONS) ×3 IMPLANT

## 2018-06-01 NOTE — Anesthesia Postprocedure Evaluation (Signed)
Anesthesia Post Note  Patient: Michelle Mcmillan  Procedure(s) Performed: RIGHT BREAST RECONSTRUCTION WITH LATISSIMUS MYOCUTANEOUS FLAP (Right Breast)     Patient location during evaluation: PACU Anesthesia Type: General Level of consciousness: awake and alert Pain management: pain level controlled Vital Signs Assessment: post-procedure vital signs reviewed and stable Respiratory status: spontaneous breathing, nonlabored ventilation, respiratory function stable and patient connected to nasal cannula oxygen Cardiovascular status: blood pressure returned to baseline and stable Postop Assessment: no apparent nausea or vomiting Anesthetic complications: no    Last Vitals:  Vitals:   06/01/18 1340 06/01/18 1355  BP: 119/89   Pulse: 61   Resp: 14   Temp:  (!) 36.4 C  SpO2: 92%     Last Pain:  Vitals:   06/01/18 1355  TempSrc:   PainSc: 8                  Stacyann Mcconaughy,W. EDMOND

## 2018-06-01 NOTE — Transfer of Care (Signed)
Immediate Anesthesia Transfer of Care Note  Patient: Michelle Mcmillan  Procedure(s) Performed: RIGHT BREAST RECONSTRUCTION WITH LATISSIMUS MYOCUTANEOUS FLAP (Right Breast)  Patient Location: PACU  Anesthesia Type:General  Level of Consciousness: awake, alert  and oriented  Airway & Oxygen Therapy: Patient Spontanous Breathing  Post-op Assessment: Report given to RN, Post -op Vital signs reviewed and stable and Patient moving all extremities X 4  Post vital signs: Reviewed and stable  Last Vitals:  Vitals Value Taken Time  BP 129/90 06/01/2018 11:40 AM  Temp    Pulse 71 06/01/2018 11:40 AM  Resp 23 06/01/2018 11:40 AM  SpO2 100 % 06/01/2018 11:40 AM  Vitals shown include unvalidated device data.  Last Pain:  Vitals:   06/01/18 0605  TempSrc:   PainSc: 3          Complications: No apparent anesthesia complications

## 2018-06-01 NOTE — Anesthesia Preprocedure Evaluation (Addendum)
Anesthesia Evaluation  Patient identified by MRN, date of birth, ID band Patient awake    Reviewed: Allergy & Precautions, NPO status , Patient's Chart, lab work & pertinent test results  History of Anesthesia Complications Negative for: history of anesthetic complications  Airway Mallampati: I  TM Distance: >3 FB Neck ROM: Full    Dental no notable dental hx. (+) Teeth Intact, Dental Advisory Given,    Pulmonary neg pulmonary ROS,    Pulmonary exam normal breath sounds clear to auscultation       Cardiovascular hypertension, Normal cardiovascular exam Rhythm:Regular Rate:Normal     Neuro/Psych negative neurological ROS  negative psych ROS   GI/Hepatic Neg liver ROS, GERD  ,  Endo/Other  Morbid obesity  Renal/GU negative Renal ROS  negative genitourinary   Musculoskeletal negative musculoskeletal ROS (+)   Abdominal   Peds  Hematology negative hematology ROS (+)   Anesthesia Other Findings 48 yo F for right breast latissimus flap - HTN, GERD, breast cancer, BMI 46  Reproductive/Obstetrics                           Anesthesia Physical Anesthesia Plan  ASA: III  Anesthesia Plan: General   Post-op Pain Management:    Induction: Intravenous  PONV Risk Score and Plan: 3 and Ondansetron, Dexamethasone, Midazolam and Treatment may vary due to age or medical condition  Airway Management Planned: Oral ETT  Additional Equipment: None  Intra-op Plan:   Post-operative Plan: Extubation in OR  Informed Consent: I have reviewed the patients History and Physical, chart, labs and discussed the procedure including the risks, benefits and alternatives for the proposed anesthesia with the patient or authorized representative who has indicated his/her understanding and acceptance.     Dental advisory given  Plan Discussed with:   Anesthesia Plan Comments:        Anesthesia Quick  Evaluation

## 2018-06-01 NOTE — Plan of Care (Signed)
  Problem: Education: Goal: Knowledge of disease or condition will improve Outcome: Progressing   Problem: Activity: Goal: Ability to maintain or regain function will improve Outcome: Progressing   Problem: Clinical Measurements: Goal: Postoperative complications will be avoided or minimized Outcome: Progressing   Problem: Self-Concept: Goal: Ability to verbalize positive feelings about self will improve Outcome: Progressing   Problem: Pain Management: Goal: Expressions of feelings of enhanced comfort will increase Outcome: Progressing   Problem: Skin Integrity: Goal: Demonstration of wound healing without infection will improve Outcome: Progressing

## 2018-06-01 NOTE — Op Note (Signed)
DATE OF OPERATION: 06/01/2018  LOCATION: [facility]  PREOPERATIVE DIAGNOSIS:  1. Breast Cancer Status post right mastectomy. 2.   Acquired absence of right Breast.  POSTOPERATIVE DIAGNOSIS: Same  PROCEDURE: 1. Latissimus myocutaneous flap  to reconstruct the right breast CPT 19361  SURGEON: Lyndee Leo Sanger Skyra Crichlow, DO  ASSISTANT: Bonita Cox, RNFA  EBL: 150 cc  SPECIMEN: None  DRAINS: 3 total 2 blake round drains  CONDITION: Stable  COMPLICATIONS: None  INDICATION: The patient, Michelle Mcmillan, is a 48 y.o. female born on 07-Dec-1970, is here for treatment after a right mastectomy with resulting absence of breast and asymmetry. An expander was already placed and expansion started.  The skin was too tight and expansion could not progress without further tissue.  PROCEDURE DETAILS:  The patient was seen on the morning of her surgery and marked for the location of the flap.  An IV was placed and IV antibiotics were given. The patient was taken to the operating room and placed on the operating room table.  A general anesthetic was administered.  A standard time out was performed and all information was confirmed to be correct by those in the room. Leg compression devices were placed on her legs and a foley catheter placed.  The patient was placed in the left lateral decubitus position on a beanbag.  All key prominent points were padded and an axillary roll was placed in the dependent axilla. The ipsilateral arm was placed on a padded rest anteriosuperiorly.  She was then prepped and draped in the standard sterile fashion. The paddle design and position were confirmed.   The procedure began by incising the skin at the marked skin paddle.  The #10 blade and bovie were used to dissect down to the latissimus muscle.  Anterior and posterior flaps were raised to expose the latissimus muscle edges.  The skin and fat flaps were elevated to the extent necessary for release of the muscle.  The  muscle was released at the superior edge of the inferior angle of the scapula.  This aids in identification of the serratus muscle to prevent lifting it with the latissimus muscle.  The larger caliber perforator were clipped and the smaller vessels controlled with electrocautery.  The dissection continued toward the midline and inferior toward the iliac crest.  The subscapular artery to the thoracodorsal artery was palpated in the axilla.  The branch to the serratus is ligated.  Care was taken to protect the vascular pedicle throughout this portion of the procedure. The paddle and muscle looked healthy throughout the case.  The old mastectomy scar was incised and the flaps on top of the pectoralis muscle were raised.  The posterior and anterior pockets were then connected in the plane above the muscle. The pectoralis and expander were in place.  The muscle from the back and the skin paddle were then rotated into the chest pocket and covered with an ioban dressing. The flap and skin pedicle were inspected and there was no tension. The back pocket was hemostased and Evicel placed. Two #19 blake round drains were place in the anterior skin flap and secured with 3-0 Silk. One drain was positioned inferiorly and one superiorly.  The back incision was closed in layers with buried 3-0 Vicryl, followed by 4-0 Monocryl and 5-0 Monocryl.  Dermabond and a protective dressing was applied.   The patient was repositioned onto her back and the chest was prepped and draped. The breast pocket was inspected and hemostases was achieved  with electrocautery. The muscle was then secured superiorly to the pectoralis muscle with 3-0 Monocryl.  The inferior portion of the muscle was tacked to the inframammary fold with 3-0 Monocryl.  One drain was placed on this side and secured with 3-0 Silk. The flap was then closed with 3-0 Monocryl deep, followed by 4-0 Monocryl and the skin closed with 5-0 Monocryl.  Dermabond, ABDs and a breast  binder was applied.    The patient was allowed to wake up and taken to recovery room in stable condition at the end of the case. The family was notified at the end of the case.   The RNFA assisted throughout the case.  The RNFA was essential in retraction and counter traction when needed to make the case progress smoothly.  This retraction and assistance made it possible to see the tissue plans for the procedure.  The assistance was needed for blood control, tissue re-approximation and assisted with closure of the incision site.

## 2018-06-01 NOTE — Discharge Instructions (Signed)
INSTRUCTIONS FOR AFTER BREAST SURGERY   The following information will help you and your family understand what to expect when you are discharged from the hospital.  Following these guidelines will help ensure a smooth recovery and reduce risks of complications.   Postoperative instructions include information on: diet, wound care, medications and physical activity.  DIET Breast surgery does not require a specific diet.  However, I have to mention that the healthier you eat the better your body can start healing. It is important to increasing your protein intake.  This means limiting the foods with sugar and carbohydrates.  Focus on vegetables and some meat.  If you have any liposuction during your procedure be sure to drink water.  If your urine is bright yellow, then it is concentrated, and you need to drink more water.  As a general rule after surgery, you should have 8 ounces of water every hour while awake.  If you find you are persistently nauseated or unable to take in liquids let us know.  NO TOBACCO USE or EXPOSURE.  This will slow your healing process and increase the risk of a wound.  WOUND CARE When your drains are removed you can shower.  Use fragrance free soap.  Dial, [place] and [place] are usually mild on the skin.  While your drain is in place, use baby wipes.   No baths, pools or hot tubs for two weeks. We close your incision to leave the smallest and best-looking scar. No ointment or creams on your incisions until given the go ahead.  Especially not Neosporin (Too many skin reactions with this one).  A few weeks after surgery you can use Mederma and start massaging the scar. We ask you to wear your binder or sports bra for the first 6 weeks around the clock, including while sleeping. This provides added comfort and helps reduce the fluid accumulation at the surgery site.  ACTIVITY No heavy lifting until cleared by the doctor.  This usually means no more than a half-gallon of milk.  It is  OK to walk and climb stairs. In fact, moving your legs is very important to decrease your risk of a blood clot.  It will also help keep you from getting deconditioned.  Every 1 to 2 hours get up and walk for 5 minutes. This will help with a quicker recovery back to normal.  Let pain be your guide so you don't do too much.  NO, you cannot do the spring cleaning and don't plan on taking care of anyone else.  This is your time for TLC.  You will be more comfortable if you sleep and rest with your head elevated either with a few pillows under you or in a recliner.  No stomach sleeping for a few months.  WORK As a rough guide, most people take at least 1 - 2 weeks off prior to returning to work. If you need documentation for your job, bring the forms to your postoperative follow up visit.  DRIVING Arrange for someone to bring you home from the hospital.  You may be able to drive a few days after surgery but not while taking any narcotics or valium.  BOWEL MOVEMENTS Constipation can occur after anesthesia and while taking pain medication.  It is important to stay ahead for your comfort.  We recommend taking Milk of Magnesia (2 tablespoons; twice a day) while taking the pain pills.  SEROMA This is fluid your body tried to put in the surgical  site.  This is normal but if it creates tight skinny skin let us know.  It usually decreases in a few weeks.  WHEN TO CALL Call your surgeon's office if any of the following occur:  Fever 101 degrees F or greater  Excessive bleeding or fluid from the incision site.  Pain that increases over time without aid from the medications  Redness, warmth, or pus draining from incision sites  Persistent nausea or inability to take in liquids  Severe misshapen area that underwent the operation.  Here are some resources:  1. Plastic surgery website:  https://www.plasticsurgery.org/for-medical-professionals/education-and-resources/publications/breast-reconstruction-magazine 2. Breast Reconstruction Awareness Campaign:  HotelLives[employer]co.nz 3. Plastic surgery Implant information:  https://www.plasticsurgery.org/patient-safety/breast-implant-safety

## 2018-06-01 NOTE — Anesthesia Procedure Notes (Signed)
Procedure Name: Intubation Date/Time: 06/01/2018 7:31 AM Performed by: Harden Mo, CRNA Pre-anesthesia Checklist: Patient identified, Emergency Drugs available, Suction available and Patient being monitored Patient Re-evaluated:Patient Re-evaluated prior to induction Oxygen Delivery Method: Circle System Utilized Preoxygenation: Pre-oxygenation with 100% oxygen Induction Type: IV induction Ventilation: Mask ventilation without difficulty and Oral airway inserted - appropriate to patient size Laryngoscope Size: Sabra Heck and 2 Grade View: Grade I Tube type: Oral Tube size: 7.5 mm Number of attempts: 1 Airway Equipment and Method: Stylet and Oral airway Placement Confirmation: ETT inserted through vocal cords under direct vision,  positive ETCO2 and breath sounds checked- equal and bilateral Secured at: 22 cm Tube secured with: Tape Dental Injury: Teeth and Oropharynx as per pre-operative assessment

## 2018-06-01 NOTE — Interval H&P Note (Signed)
History and Physical Interval Note:  06/01/2018 7:05 AM  Michelle Mcmillan  has presented today for surgery, with the diagnosis of Malignant Neoplasm Of Upper-outer Quadrant Of Right Breast In Female, Estrogen Receptor Positive  The various methods of treatment have been discussed with the patient and family. After consideration of risks, benefits and other options for treatment, the patient has consented to  Procedure(s) with comments: RIGHT LATISSIMUS FLAP TO BREAST WITH EXPANDER PLACEMENT (Right) - adjust case to reflect 180 min as a surgical intervention .  The patient's history has been reviewed, patient examined, no change in status, stable for surgery.  I have reviewed the patient's chart and labs.  Questions were answered to the patient's satisfaction.     Loel Lofty Charna Neeb

## 2018-06-02 ENCOUNTER — Encounter (HOSPITAL_COMMUNITY): Payer: Self-pay | Admitting: Plastic Surgery

## 2018-06-02 LAB — CBC
HCT: 31.6 % — ABNORMAL LOW (ref 36.0–46.0)
Hemoglobin: 9.5 g/dL — ABNORMAL LOW (ref 12.0–15.0)
MCH: 23.3 pg — ABNORMAL LOW (ref 26.0–34.0)
MCHC: 30.1 g/dL (ref 30.0–36.0)
MCV: 77.6 fL — ABNORMAL LOW (ref 80.0–100.0)
Platelets: 216 10*3/uL (ref 150–400)
RBC: 4.07 MIL/uL (ref 3.87–5.11)
RDW: 20.2 % — ABNORMAL HIGH (ref 11.5–15.5)
WBC: 7.9 10*3/uL (ref 4.0–10.5)
nRBC: 0 % (ref 0.0–0.2)

## 2018-06-02 LAB — BASIC METABOLIC PANEL
Anion gap: 9 (ref 5–15)
BUN: 14 mg/dL (ref 6–20)
CO2: 28 mmol/L (ref 22–32)
CREATININE: 1.09 mg/dL — AB (ref 0.44–1.00)
Calcium: 8.3 mg/dL — ABNORMAL LOW (ref 8.9–10.3)
Chloride: 100 mmol/L (ref 98–111)
GFR calc non Af Amer: 60 mL/min — ABNORMAL LOW (ref >60)
Glucose, Bld: 159 mg/dL — ABNORMAL HIGH (ref 70–99)
Potassium: 3.8 mmol/L (ref 3.5–5.1)
Sodium: 137 mmol/L (ref 135–145)

## 2018-06-02 LAB — HIV ANTIBODY (ROUTINE TESTING W REFLEX): HIV Screen 4th Generation wRfx: NONREACTIVE

## 2018-06-02 NOTE — Progress Notes (Signed)
Husband emptied JPs without difficulty, states that he has done this before. Dsg supplies sent home with pt. No questions about DC paperwork.

## 2018-06-02 NOTE — Discharge Summary (Signed)
Physician Discharge Summary  Patient ID: Michelle Mcmillan MRN: [id number] DOB/AGE: 49-Nov-1972 49 y.o.  Admit date: 06/01/2018 Discharge date: 06/02/2018  Admission Diagnoses:  Discharge Diagnoses:  Active Problems:   Acquired absence of right breast   Discharged Condition: good  Hospital Course: The patient was taken to the operating room and underwent a right latissimus muscle flap.  This was for right breast reconstruction.  The patient did well throughout the procedure.  She was managed on the surgery ward.  She was eating, tolerating food well, voiding and walking postop day 1.  The patient was offered another night in the hospital and requested to go home.  She was doing so well this was agreed upon.  Consults: None  Significant Diagnostic Studies: none  Treatments: surgery  Discharge Exam: Blood pressure 114/83, pulse 73, temperature 99 F (37.2 C), temperature source Oral, resp. rate 18, height 5\' 9"  (1.753 m), weight (!) 142.4 kg, SpO2 100 %. General appearance: alert, cooperative and no distress Incision/Wound: The flap had excellent capillary refill and the drains were working appropriately.  Disposition: Discharge disposition: 01-Home or Self Care       Discharge Instructions    Call MD for:  difficulty breathing, headache or visual disturbances   Complete by:  As directed    Call MD for:  persistant nausea and vomiting   Complete by:  As directed    Call MD for:  redness, tenderness, or signs of infection (pain, swelling, redness, odor or green/yellow discharge around incision site)   Complete by:  As directed    Call MD for:  severe uncontrolled pain   Complete by:  As directed    Call MD for:  temperature >100.4   Complete by:  As directed    Diet general   Complete by:  As directed    Discharge wound care:   Complete by:  As directed    Drain care and sink baths   Driving Restrictions   Complete by:  As directed    No driving while on pain medication.    Increase activity slowly   Complete by:  As directed    Lifting restrictions   Complete by:  As directed    No lifting beyond 5 pounds      Follow-up Information    Jaslynne Dahan, Loel Lofty, DO In 1 week.   Specialty:  Plastic Surgery Contact information: [address] [place] [address] [phone]           Signed: Wallace Going 06/02/2018, 7:36 AM

## 2018-06-04 NOTE — Progress Notes (Signed)
  Oncology Nurse Navigator Documentation  Navigator Location: [facility] (06/04/18 1600)   )Navigator Encounter Type: Telephone (06/04/18 1600) Telephone: Symptom Mgt;Outgoing Call;Financial Assistance (06/04/18 1600)                   Patient Visit Type: Follow-up (06/04/18 1600)   Barriers/Navigation Needs: Financial (06/04/18 1600)   Interventions: Psycho-social support;Referrals (06/04/18 1600) Referrals: Home Health (06/04/18 1600)                    Time Spent with Patient: 30 (06/04/18 1600)   Assisting patient with Home care through [facility] post breast reconstructive surgery 06/01/2018.

## 2018-06-05 ENCOUNTER — Encounter: Payer: Self-pay | Admitting: *Deleted

## 2018-06-05 NOTE — Progress Notes (Signed)
  Oncology Nurse Navigator Documentation  Navigator Location: [facility] (06/05/18 1000)   )Navigator Encounter Type: Telephone (06/05/18 1000)                               Referrals: Home Health (06/05/18 1000)                    Time Spent with Patient: 15 (06/05/18 1000)   Called and informed patient of her approval for Home Care Providers assistance from the [facility].  She is to call myself or Webb Silversmith if she does not hear from them in a couple of days.

## 2018-06-09 ENCOUNTER — Encounter: Payer: Self-pay | Admitting: Plastic Surgery

## 2018-06-09 ENCOUNTER — Ambulatory Visit (INDEPENDENT_AMBULATORY_CARE_PROVIDER_SITE_OTHER): Payer: Medicaid Other | Admitting: Plastic Surgery

## 2018-06-09 VITALS — BP 109/75 | HR 84 | Temp 98.2°F | Ht 69.0 in | Wt 314.0 lb

## 2018-06-09 DIAGNOSIS — Z9011 Acquired absence of right breast and nipple: Secondary | ICD-10-CM

## 2018-06-09 DIAGNOSIS — Z9013 Acquired absence of bilateral breasts and nipples: Secondary | ICD-10-CM

## 2018-06-09 DIAGNOSIS — Z9012 Acquired absence of left breast and nipple: Secondary | ICD-10-CM

## 2018-06-09 DIAGNOSIS — N6489 Other specified disorders of breast: Secondary | ICD-10-CM

## 2018-06-09 NOTE — Progress Notes (Signed)
   Subjective:    Patient ID: Michelle Mcmillan, female    DOB: 10-Sep-1970, 48 y.o.   MRN: [id number]  The patient is a 48 year old female here with her husband for follow-up after a right latissimus muscle flap.  She said that she is feeling very good and even better than when she had her original mastectomy surgery.  The drains are in place and working nicely.  There is no sign of infection.  The flap is alive with excellent capillary refill.  There is no sign of seroma or hematoma.    Review of Systems  Constitutional: Negative.  Negative for activity change and appetite change.  HENT: Negative.   Eyes: Negative.   Respiratory: Negative.  Negative for chest tightness.   Cardiovascular: Negative.   Gastrointestinal: Negative.   Genitourinary: Negative.   Musculoskeletal: Negative.        Objective:   Physical Exam Vitals signs and nursing note reviewed.  Constitutional:      Appearance: Normal appearance.  HENT:     Head: Normocephalic and atraumatic.     Mouth/Throat:     Mouth: Mucous membranes are moist.  Eyes:     Extraocular Movements: Extraocular movements intact.  Cardiovascular:     Rate and Rhythm: Normal rate.  Pulmonary:     Effort: Pulmonary effort is normal.  Neurological:     General: No focal deficit present.     Mental Status: She is alert.  Psychiatric:        Mood and Affect: Mood normal.        Thought Content: Thought content normal.        Judgment: Judgment normal.        Assessment & Plan:  Acquired absence of right breast  S/P mastectomy, bilateral  Acquired absence of left breast  Postoperative breast asymmetry  We placed injectable saline in the Expander using a sterile technique: Right: 50 cc for a total of 300 / 535 cc Left: 50 cc for a total of 570 / 535 cc  We will hopefully be able to remove 1 of the posterior drains next week.

## 2018-06-12 ENCOUNTER — Encounter: Payer: Medicaid Other | Admitting: Plastic Surgery

## 2018-06-16 ENCOUNTER — Ambulatory Visit (INDEPENDENT_AMBULATORY_CARE_PROVIDER_SITE_OTHER): Payer: Medicaid Other | Admitting: Plastic Surgery

## 2018-06-16 ENCOUNTER — Encounter: Payer: Self-pay | Admitting: Plastic Surgery

## 2018-06-16 ENCOUNTER — Other Ambulatory Visit: Payer: Self-pay

## 2018-06-16 VITALS — BP 138/95 | HR 65 | Temp 98.2°F | Resp 16

## 2018-06-16 DIAGNOSIS — Z9011 Acquired absence of right breast and nipple: Secondary | ICD-10-CM

## 2018-06-16 DIAGNOSIS — Z9012 Acquired absence of left breast and nipple: Secondary | ICD-10-CM

## 2018-06-16 DIAGNOSIS — Z9013 Acquired absence of bilateral breasts and nipples: Secondary | ICD-10-CM

## 2018-06-16 DIAGNOSIS — N6489 Other specified disorders of breast: Secondary | ICD-10-CM

## 2018-06-16 DIAGNOSIS — Z9889 Other specified postprocedural states: Secondary | ICD-10-CM | POA: Insufficient documentation

## 2018-06-16 MED ORDER — DICLOFENAC SODIUM 1 % TD GEL: 2.0000 g | Freq: Four times a day (QID) | TRANSDERMAL | 1 refills | Status: AC

## 2018-06-16 NOTE — Progress Notes (Signed)
   Subjective:    Patient ID: Michelle Mcmillan, female    DOB: 1971-01-01, 48 y.o.   MRN: [id number]  The patient is a 48 year old female here with her husband for postop follow-up after undergoing right sided breast reconstruction.  She had a latissimus placed over her expander.  The drain output is minimal and as expected.  The posterior incision is healing well.  The flap looks excellent.  The patient expressed some sadness today.  She seems to be a little bit tired.  I tried to encourage her to give herself some leeway emotionally.  She seemed to appreciate this.  She also complained of ulnar sided left hand numbness.  It seems to be coming from the elbow.  It seems to be worse with flexion. She does not have any chest pain.   Review of Systems  Constitutional: Negative.   HENT: Negative.   Respiratory: Negative.   Cardiovascular: Negative.   Genitourinary: Negative.   Musculoskeletal: Negative.   Psychiatric/Behavioral: Positive for agitation.       Objective:   Physical Exam Vitals signs and nursing note reviewed.  Constitutional:      Appearance: Normal appearance.  HENT:     Head: Normocephalic and atraumatic.  Cardiovascular:     Rate and Rhythm: Normal rate.  Pulmonary:     Effort: Pulmonary effort is normal.  Neurological:     Mental Status: She is alert.  Psychiatric:        Mood and Affect: Mood normal.        Behavior: Behavior normal.        Thought Content: Thought content normal.        Assessment & Plan:  Acquired absence of right breast  S/P mastectomy, bilateral  Acquired absence of left breast  Postoperative breast asymmetry  S/P breast reconstruction, bilateral  We placed injectable saline in the Expander using a sterile technique: Right: 50 cc for a total of 350 / 535 cc Left: 50 cc for a total of 620 / 535 cc  The superior back drain was removed. Plan to follow-up in 1 week. Recommend anti-inflammatories for the left ulnar nerve irritation.   I will also try to call in some Voltaren cream for her left elbow pain.

## 2018-06-23 ENCOUNTER — Ambulatory Visit (INDEPENDENT_AMBULATORY_CARE_PROVIDER_SITE_OTHER): Payer: Medicaid Other | Admitting: Plastic Surgery

## 2018-06-23 ENCOUNTER — Encounter: Payer: Self-pay | Admitting: Plastic Surgery

## 2018-06-23 ENCOUNTER — Other Ambulatory Visit: Payer: Self-pay

## 2018-06-23 VITALS — BP 147/93 | HR 79 | Temp 98.3°F | Ht 69.0 in | Wt 310.8 lb

## 2018-06-23 DIAGNOSIS — Z9011 Acquired absence of right breast and nipple: Secondary | ICD-10-CM

## 2018-06-23 DIAGNOSIS — Z9013 Acquired absence of bilateral breasts and nipples: Secondary | ICD-10-CM

## 2018-06-23 DIAGNOSIS — Z9889 Other specified postprocedural states: Secondary | ICD-10-CM

## 2018-06-23 NOTE — Progress Notes (Signed)
   Subjective:    Patient ID: Michelle Mcmillan, female    DOB: 26-Feb-1971, 48 y.o.   MRN: [id number]  The patient is a 48 year old black female here with her husband for follow-up on her breast reconstruction.  She had a latissimus muscle flap placed to her right breast with expander.  She has been doing very good with expansion overall.  Last week she had some complaints of left hand numbness in the ulnar distribution.  She was instructed on positioning.  The incisions are healing nicely.  There is no sign of infection and no redness. Drains working well and minimal output noted.   Review of Systems  Constitutional: Negative.   HENT: Negative.   Eyes: Negative.   Respiratory: Negative.   Cardiovascular: Negative.   Gastrointestinal: Negative.   Endocrine: Negative.   Genitourinary: Negative.   Musculoskeletal: Negative.   Hematological: Negative.   Psychiatric/Behavioral: Negative.        Objective:   Physical Exam Vitals signs and nursing note reviewed.  Constitutional:      Appearance: Normal appearance.  HENT:     Head: Normocephalic and atraumatic.  Cardiovascular:     Rate and Rhythm: Normal rate.  Pulmonary:     Effort: Pulmonary effort is normal.  Neurological:     General: No focal deficit present.     Mental Status: She is alert and oriented to person, place, and time. Mental status is at baseline.  Psychiatric:        Mood and Affect: Mood normal.        Behavior: Behavior normal.        Assessment & Plan:  S/P breast reconstruction, bilateral  S/P mastectomy, bilateral  Acquired absence of right breast  Right breast drain removed.  No fluid placed today. Plan posterior drain removal next week. Right: total of 350 / 535 cc Left: total of 620 / 535 cc no saline placed today.

## 2018-07-02 ENCOUNTER — Other Ambulatory Visit: Payer: Self-pay

## 2018-07-02 ENCOUNTER — Encounter: Payer: Self-pay | Admitting: Plastic Surgery

## 2018-07-02 ENCOUNTER — Ambulatory Visit (INDEPENDENT_AMBULATORY_CARE_PROVIDER_SITE_OTHER): Payer: Medicaid Other | Admitting: Plastic Surgery

## 2018-07-02 VITALS — BP 138/93 | HR 77 | Temp 98.1°F | Ht 69.0 in | Wt 311.0 lb

## 2018-07-02 DIAGNOSIS — Z9011 Acquired absence of right breast and nipple: Secondary | ICD-10-CM

## 2018-07-02 DIAGNOSIS — Z9889 Other specified postprocedural states: Secondary | ICD-10-CM

## 2018-07-02 NOTE — Progress Notes (Signed)
   Subjective:    Patient ID: Michelle Mcmillan, female    DOB: 1970/09/27, 48 y.o.   MRN: [id number]  Patient is a 48 year old female here with her husband for follow-up on her breast reconstruction.  She is doing well overall.  The skin is loosening up very nicely.  All incisions are healing well.  There is no sign of redness or infection.  There is no sign of seroma in the back.  The drain output is minimal.   Review of Systems  Constitutional: Negative.   HENT: Negative.   Eyes: Negative.   Respiratory: Negative.   Cardiovascular: Negative.   Gastrointestinal: Negative.   Genitourinary: Negative.   Musculoskeletal: Negative.   Psychiatric/Behavioral: Negative.        Objective:   Physical Exam Vitals signs and nursing note reviewed.  Constitutional:      Appearance: Normal appearance.  HENT:     Head: Normocephalic and atraumatic.     Nose: Nose normal.  Cardiovascular:     Rate and Rhythm: Normal rate.  Pulmonary:     Effort: Pulmonary effort is normal.  Neurological:     General: No focal deficit present.     Mental Status: She is alert.  Psychiatric:        Mood and Affect: Mood normal.        Behavior: Behavior normal.        Assessment & Plan:  S/P breast reconstruction, bilateral  Acquired absence of right breast  We placed injectable saline in the Expander using a sterile technique: Right: 50 cc for a total of 400 / 535 cc Left: 50 cc for a total of 670 / 535 cc  The back drain was removed.   Follow up in 2 weeks.

## 2018-07-03 ENCOUNTER — Telehealth: Payer: Self-pay | Admitting: *Deleted

## 2018-07-03 MED ORDER — GABAPENTIN 300 MG PO CAPS: 300.0000 mg | ORAL_CAPSULE | Freq: Three times a day (TID) | ORAL | 1 refills | Status: DC

## 2018-07-03 NOTE — Telephone Encounter (Signed)
Patient called reporting that she is still having numbness in her hands ( 2 outer fingers) and in her feet some. She had been having cramps in her hands as well, but her reconstruction surgeon ordered Voltaren cream for her and that has helped with the cramps, but not the numbness. She is questioning a dose adjustment of her gabapentin as discussed at last office visit

## 2018-07-03 NOTE — Telephone Encounter (Signed)
-----   Message from Wallene Dales sent at 07/03/2018  1:56 PM EDT ----- Regarding: Numbness In Hands Contact: [phone] Patient called and stated she is having a lot more numbness in her hands and would like a call about it please.

## 2018-07-03 NOTE — Telephone Encounter (Signed)
Increase gabapentin to 300g TID.  Can do 600mg  at night if it seems to be worse while sleeping.

## 2018-07-03 NOTE — Telephone Encounter (Signed)
Patient informed of doctor recommendations and stated she needs a refill of this medicine to [facility]. Prescription sent

## 2018-07-08 ENCOUNTER — Telehealth: Payer: Self-pay | Admitting: *Deleted

## 2018-07-08 NOTE — Telephone Encounter (Signed)
Received call from the patient and she stated that she feels she is having fluid build-up on the right side where the back drain was removed at her last visit.   She says when moving she can tell that fluid is there.  When she took the binder off she noticed the side was larger.  When compared to the other side it's way bigger.   She said that is no pain.  She mentioned that she remember hearing [name] say something about massaging the area, so she was wondering if that is what she needs to do.  Informed the patient that I will inform Dr. Perfecto Kingdom of this message and we will give her a call back.  Informed Bonita,RN of the message and she spoke with [name] and she would like for the patient to come in the office to have the fluid removed.  Bonita,RN also said that the front office staff will give the patient a call back and schedule her for an appointment for this Friday.//AB/CMA

## 2018-07-09 ENCOUNTER — Other Ambulatory Visit: Payer: Self-pay

## 2018-07-09 ENCOUNTER — Ambulatory Visit (INDEPENDENT_AMBULATORY_CARE_PROVIDER_SITE_OTHER): Payer: Medicaid Other | Admitting: Plastic Surgery

## 2018-07-09 ENCOUNTER — Encounter: Payer: Self-pay | Admitting: Plastic Surgery

## 2018-07-09 VITALS — BP 129/86 | HR 62 | Temp 98.3°F | Ht 69.0 in | Wt 311.0 lb

## 2018-07-09 DIAGNOSIS — Z9011 Acquired absence of right breast and nipple: Secondary | ICD-10-CM

## 2018-07-09 DIAGNOSIS — Z9889 Other specified postprocedural states: Secondary | ICD-10-CM

## 2018-07-09 NOTE — Progress Notes (Signed)
The patient is here for follow-up after drain removal from her back.  She had a latissimus flap for the right breast.  She was concerned there was some fluid buildup.  Today there seems to be the possibility of a fluid wave.  Due to her adipose it is difficult to tell for sure.  I did place an needle and did not get out any fluid or air.  There were no complications.  I am going to have her to continue with the binder and follow-up in 2 weeks.  Call if any change or if she notices any fluid buildup.

## 2018-07-17 ENCOUNTER — Ambulatory Visit: Payer: Medicaid Other | Admitting: Plastic Surgery

## 2018-07-20 ENCOUNTER — Telehealth: Payer: Self-pay | Admitting: Plastic Surgery

## 2018-07-20 NOTE — Telephone Encounter (Signed)
Called patient to confirm appointment scheduled for tomorrow. Patient answered the following questions: °1.Has the patient traveled outside of the state of [place] at all within the past 6 weeks? No °2.Does the patient have a fever or cough at all? No °3.Has the patient been tested for COVID? Had a positive COVID test? No °4. Has the patient been in contact with anyone who has tested positive? No ° °

## 2018-07-21 ENCOUNTER — Encounter: Payer: Self-pay | Admitting: Plastic Surgery

## 2018-07-21 ENCOUNTER — Other Ambulatory Visit: Payer: Self-pay

## 2018-07-21 ENCOUNTER — Ambulatory Visit (INDEPENDENT_AMBULATORY_CARE_PROVIDER_SITE_OTHER): Payer: Medicaid Other | Admitting: Plastic Surgery

## 2018-07-21 VITALS — BP 128/81 | HR 76 | Temp 98.7°F | Ht 69.0 in | Wt 311.0 lb

## 2018-07-21 DIAGNOSIS — Z9889 Other specified postprocedural states: Secondary | ICD-10-CM

## 2018-07-21 DIAGNOSIS — Z9011 Acquired absence of right breast and nipple: Secondary | ICD-10-CM

## 2018-07-21 NOTE — Progress Notes (Signed)
   Subjective:    Patient ID: Michelle Mcmillan, female    DOB: 1970/04/13, 48 y.o.   MRN: [id number]  The patient is a 48 yrs old female here with her husband for follow-up on her breast reconstruction.  Overall she is doing very well and pleased with her progress.  She is tolerating expansion well.  She is a little bigger on the left.  All incisions healing well.  No sign of swelling or seroma.    Review of Systems  Constitutional: Negative.   HENT: Negative.   Respiratory: Negative.   Cardiovascular: Negative.   Gastrointestinal: Negative.   Genitourinary: Negative.   Musculoskeletal: Negative.   Skin: Negative for color change.  Psychiatric/Behavioral: Negative.        Objective:   Physical Exam Vitals signs reviewed.  Constitutional:      Appearance: Normal appearance.  HENT:     Head: Normocephalic and atraumatic.  Cardiovascular:     Rate and Rhythm: Normal rate.     Pulses: Normal pulses.  Pulmonary:     Effort: Pulmonary effort is normal.  Neurological:     General: No focal deficit present.     Mental Status: She is alert and oriented to person, place, and time.  Psychiatric:        Mood and Affect: Mood normal.        Assessment & Plan:  S/P breast reconstruction, bilateral  Acquired absence of right breast  We placed injectable saline in the Expander using a sterile technique: Right: 50 cc for a total of 450 / 535 cc Left: 50 cc for a total of 720 / 535 cc  Follow up in 3 weeks.

## 2018-08-13 ENCOUNTER — Encounter: Payer: Self-pay | Admitting: Plastic Surgery

## 2018-08-13 ENCOUNTER — Other Ambulatory Visit: Payer: Self-pay

## 2018-08-13 ENCOUNTER — Ambulatory Visit (INDEPENDENT_AMBULATORY_CARE_PROVIDER_SITE_OTHER): Payer: Medicaid Other | Admitting: Plastic Surgery

## 2018-08-13 VITALS — BP 133/87 | HR 66 | Temp 98.3°F | Ht 69.0 in | Wt 309.8 lb

## 2018-08-13 DIAGNOSIS — Z9011 Acquired absence of right breast and nipple: Secondary | ICD-10-CM

## 2018-08-13 DIAGNOSIS — Z9889 Other specified postprocedural states: Secondary | ICD-10-CM

## 2018-08-13 DIAGNOSIS — Z170 Estrogen receptor positive status [ER+]: Secondary | ICD-10-CM

## 2018-08-13 DIAGNOSIS — C50411 Malignant neoplasm of upper-outer quadrant of right female breast: Secondary | ICD-10-CM

## 2018-08-13 NOTE — Progress Notes (Signed)
   Subjective:    Patient ID: Michelle Mcmillan, female    DOB: 11/02/1970, 48 y.o.   MRN: [id number]  The patient is a 48 yrs old bf here with her husband for follow up on her bilateral breast reconstruction.  She is pleased with her progress.  The incisions are healing well. The right breast is slightly larger than the left.   Review of Systems  Constitutional: Negative for activity change and appetite change.  HENT: Negative.   Eyes: Negative.   Respiratory: Negative.   Cardiovascular: Negative.  Negative for leg swelling.  Gastrointestinal: Negative.  Negative for abdominal pain.  Endocrine: Negative.   Genitourinary: Negative.   Musculoskeletal: Negative.   Skin: Negative for color change and wound.  Psychiatric/Behavioral: Negative.        Objective:   Physical Exam Vitals signs and nursing note reviewed.  Constitutional:      Appearance: Normal appearance.  HENT:     Head: Normocephalic and atraumatic.     Nose: Nose normal.     Mouth/Throat:     Mouth: Mucous membranes are moist.  Cardiovascular:     Rate and Rhythm: Normal rate.  Pulmonary:     Effort: Pulmonary effort is normal.  Abdominal:     General: Abdomen is flat. There is no distension.     Tenderness: There is no abdominal tenderness.  Neurological:     General: No focal deficit present.     Mental Status: She is alert and oriented to person, place, and time.  Psychiatric:        Behavior: Behavior normal.        Assessment & Plan:  Malignant neoplasm of upper-outer quadrant of right breast in female, estrogen receptor positive (HCC)  S/P breast reconstruction, bilateral  Acquired absence of right breast  We placed injectable saline in the Expander using a sterile technique: Right: 50 cc for a total of 500 / 535 cc No volume placed on the left side today. Plan for removal of bilateral expanders of breast and placement of silicone implants. Pictures placed in the chart with the patient  permission. Orders placed for surgery.

## 2018-08-18 ENCOUNTER — Other Ambulatory Visit: Payer: Self-pay | Admitting: *Deleted

## 2018-08-18 MED ORDER — LETROZOLE 2.5 MG PO TABS: 2.5000 mg | ORAL_TABLET | Freq: Every day | ORAL | 3 refills | Status: DC

## 2018-08-18 MED ORDER — LETROZOLE 2.5 MG PO TABS: 2.5000 mg | ORAL_TABLET | Freq: Every day | ORAL | 1 refills | Status: DC

## 2018-08-18 NOTE — Progress Notes (Signed)
ler

## 2018-08-27 ENCOUNTER — Telehealth: Payer: Self-pay | Admitting: Plastic Surgery

## 2018-08-27 NOTE — Telephone Encounter (Signed)

## 2018-08-28 ENCOUNTER — Encounter: Payer: Self-pay | Admitting: Plastic Surgery

## 2018-08-28 ENCOUNTER — Other Ambulatory Visit: Payer: Self-pay

## 2018-08-28 ENCOUNTER — Ambulatory Visit (INDEPENDENT_AMBULATORY_CARE_PROVIDER_SITE_OTHER): Payer: Medicaid Other | Admitting: Plastic Surgery

## 2018-08-28 VITALS — BP 131/89 | HR 81 | Temp 97.6°F | Ht 69.0 in | Wt 307.2 lb

## 2018-08-28 DIAGNOSIS — G5620 Lesion of ulnar nerve, unspecified upper limb: Secondary | ICD-10-CM | POA: Insufficient documentation

## 2018-08-28 DIAGNOSIS — Z9011 Acquired absence of right breast and nipple: Secondary | ICD-10-CM | POA: Diagnosis not present

## 2018-08-28 DIAGNOSIS — G5622 Lesion of ulnar nerve, left upper limb: Secondary | ICD-10-CM

## 2018-08-28 NOTE — Progress Notes (Signed)
   Subjective:    Patient ID: Michelle Mcmillan, female    DOB: [birth year], 48 y.o.   MRN: [id number]  The patient is a 48 yrs old bf here with her husband for follow-up on her bilateral breast reconstruction.  She had a latissimus muscle flap to the right.  She has expanders on both sides.  She is very pleased with her progress.  She would like to go ahead and schedule a date for her exchange surgery.  She is almost equal in size.  Today she also reminds me of some pain she is having in her left arm.  She complains of numbness in her small and ring finger on the left side.  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  Last visit I had given her some Voltaren cream.  She says this does help some.  There is no swelling.  Or sign of infection in her elbow or hands.   Review of Systems  Constitutional: Negative.   HENT: Negative.   Eyes: Negative.   Respiratory: Negative.   Cardiovascular: Negative.   Gastrointestinal: Negative.   Endocrine: Negative.   Genitourinary: Negative.   Skin: Negative.   Neurological: Positive for numbness.  Psychiatric/Behavioral: Negative.        Objective:   Physical Exam Vitals signs and nursing note reviewed.  Constitutional:      Appearance: Normal appearance.  HENT:     Head: Normocephalic.     Nose: Nose normal.  Cardiovascular:     Pulses: Normal pulses.  Pulmonary:     Effort: Pulmonary effort is normal.  Abdominal:     General: Abdomen is flat. There is no distension.     Tenderness: There is no abdominal tenderness.  Skin:    General: Skin is warm.  Neurological:     General: No focal deficit present.     Mental Status: She is alert.       Assessment & Plan:  Acquired absence of breast and absent nipple, right  Ulnar neuropathy of left upper extremity Plan for bilateral removal of breast expanders and placement of implants.  Will need liposuction laterally for symmetry. Referral to hand  surgeon Dr.John continue in [place] for the left ulnar neuropathy.  Picture taken and placed in the chart with the patient permission. We placed injectable saline in the Expander using a sterile technique: Right: 50 cc for a total of 550 / 535 cc Left: 720 / 535 cc

## 2018-08-31 ENCOUNTER — Encounter: Payer: Self-pay | Admitting: *Deleted

## 2018-09-08 DIAGNOSIS — F331 Major depressive disorder, recurrent, moderate: Secondary | ICD-10-CM | POA: Insufficient documentation

## 2018-09-10 ENCOUNTER — Telehealth: Payer: Self-pay | Admitting: Plastic Surgery

## 2018-09-10 NOTE — Telephone Encounter (Signed)

## 2018-09-11 ENCOUNTER — Encounter: Payer: Self-pay | Admitting: Plastic Surgery

## 2018-09-11 ENCOUNTER — Other Ambulatory Visit: Payer: Self-pay

## 2018-09-11 ENCOUNTER — Ambulatory Visit (INDEPENDENT_AMBULATORY_CARE_PROVIDER_SITE_OTHER): Payer: Medicaid Other | Admitting: Plastic Surgery

## 2018-09-11 VITALS — BP 123/83 | HR 68 | Temp 97.9°F | Wt 306.0 lb

## 2018-09-11 DIAGNOSIS — Z9889 Other specified postprocedural states: Secondary | ICD-10-CM

## 2018-09-11 DIAGNOSIS — Z9013 Acquired absence of bilateral breasts and nipples: Secondary | ICD-10-CM

## 2018-09-11 NOTE — Progress Notes (Signed)
   Subjective:    Patient ID: [name], female    DOB: [birth year], 48 y.o.   MRN: [id number]  The patient is a 48 year old female here with her husband for follow-up on her bilateral breast reconstruction.  She has started some antidepression medicine and her PCP gave her something for sleep.  She is also been in counseling.  She is overall doing much better.  Her incisions all look great.  She is got really good symmetry.  No sign of infection.    Review of Systems  Constitutional: Negative.   HENT: Negative.   Eyes: Negative.   Respiratory: Negative.   Cardiovascular: Negative.   Genitourinary: Negative.   Musculoskeletal: Negative.   Psychiatric/Behavioral: Negative.        Objective:   Physical Exam Vitals signs and nursing note reviewed.  Constitutional:      Appearance: Normal appearance.  HENT:     Head: Normocephalic and atraumatic.  Cardiovascular:     Rate and Rhythm: Normal rate.  Pulmonary:     Effort: Pulmonary effort is normal. No respiratory distress.     Breath sounds: No wheezing.  Neurological:     General: No focal deficit present.     Mental Status: She is alert and oriented to person, place, and time.  Psychiatric:        Mood and Affect: Mood normal.        Behavior: Behavior normal.        Assessment & Plan:     ICD-10-CM   1. S/P breast reconstruction, bilateral  Z98.890   2. S/P mastectomy, bilateral  Z90.13    We placed injectable saline in the Expander using a sterile technique: Right: 50 cc for a total of 600 / 535 cc Left: 50 cc for a total of 770 / 535 cc  I would still like her to see Dr. Fredna Dow for her ulnar nerve neuropathy.

## 2018-09-22 ENCOUNTER — Telehealth: Payer: Self-pay | Admitting: Plastic Surgery

## 2018-09-22 NOTE — Telephone Encounter (Signed)

## 2018-09-23 ENCOUNTER — Ambulatory Visit (INDEPENDENT_AMBULATORY_CARE_PROVIDER_SITE_OTHER): Payer: Medicaid Other | Admitting: Surgical

## 2018-09-23 ENCOUNTER — Encounter: Payer: Self-pay | Admitting: Surgical

## 2018-09-23 ENCOUNTER — Other Ambulatory Visit: Payer: Self-pay

## 2018-09-23 VITALS — BP 144/91 | HR 65 | Temp 98.2°F | Ht 69.0 in | Wt 302.0 lb

## 2018-09-23 DIAGNOSIS — Z9013 Acquired absence of bilateral breasts and nipples: Secondary | ICD-10-CM

## 2018-09-23 DIAGNOSIS — Z9889 Other specified postprocedural states: Secondary | ICD-10-CM

## 2018-09-23 NOTE — Progress Notes (Addendum)
Subjective:     Patient ID: Michelle Mcmillan, female    DOB: 12-26-1970, 48 y.o.   MRN: [id number]  Chief Complaint  Patient presents with  . Follow-up    2 weeks    HPI: The patient is a 48 y.o. yr old female here for follow-up on bilateral breast reconstruction. She has overall been feeling well and had minimal pain after her last expansion. She is comfortable with the current size of her left breast, but would like one more expanion on the right. There are no signs of infection.   Michelle Mcmillan continues to have some symptoms consistent with left ulnar nerve neuropathy.  She has seen Dr. Fredna Dow. She reports that she has a bone spur in her left elbow and is scheduled to have a nerve conduction test.   Review of Systems  Constitutional: Negative.   HENT: Negative.   Respiratory: Negative.   Cardiovascular: Negative.   Musculoskeletal: Negative.   Skin: Negative.   Neurological: Positive for tingling (4th and 5th digits).    Past Medical History: Allergies: Allergies  Allergen Reactions  . Nitroglycerin Other (See Comments)    Headache, n/v.    Current Medications:  Current Outpatient Medications:  .  Calcium Carb-Cholecalciferol (CALCIUM-VITAMIN D) 500-200 MG-UNIT tablet, Take 1 tablet by mouth 2 (two) times daily. , Disp: , Rfl:  .  diazepam (VALIUM) 1 MG/ML solution, Take by mouth every 8 (eight) hours as needed for anxiety., Disp: , Rfl:  .  diclofenac sodium (VOLTAREN) 1 % GEL, Apply 4 g topically 4 (four) times daily., Disp: , Rfl:  .  escitalopram (LEXAPRO) 10 MG tablet, Take 10 mg by mouth daily., Disp: , Rfl:  .  Ferrous Sulfate (IRON) 325 (65 Fe) MG TABS, Take 1 tablet (325 mg total) by mouth 2 (two) times daily., Disp: 60 each, Rfl: 2 .  gabapentin (NEURONTIN) 300 MG capsule, Take 1 capsule (300 mg total) by mouth 3 (three) times daily. May take 2 caps (600 mg) at bedtime if more bothersome at night, Disp: 90 capsule, Rfl: 1 .  hydrochlorothiazide (HYDRODIURIL) 25 MG  tablet, Take 25 mg by mouth daily., Disp: , Rfl:  .  HYDROcodone-acetaminophen (NORCO) 10-325 MG tablet, Take 1 tablet by mouth every 6 (six) hours as needed., Disp: , Rfl:  .  ibuprofen (ADVIL,MOTRIN) 800 MG tablet, Take 800 mg by mouth every 8 (eight) hours as needed (pain)., Disp: , Rfl:  .  letrozole (FEMARA) 2.5 MG tablet, Take 1 tablet (2.5 mg total) by mouth daily., Disp: 30 tablet, Rfl: 3 .  Liniments (BLUE-EMU SUPER STRENGTH) CREA, Apply 1 application topically daily as needed (pain)., Disp: , Rfl:  .  naproxen sodium (ALEVE) 220 MG tablet, Take 440 mg by mouth daily as needed (pain)., Disp: , Rfl:  .  omeprazole (PRILOSEC) 20 MG capsule, Take 1 capsule (20 mg total) by mouth daily., Disp: 30 capsule, Rfl: 3 .  potassium chloride SA (K-DUR,KLOR-CON) 20 MEQ tablet, Take 1 tablet (20 mEq total) by mouth 2 (two) times daily., Disp: 30 tablet, Rfl: 1 .  tetrahydrozoline 0.05 % ophthalmic solution, Place 2 drops into both eyes daily., Disp: , Rfl:  .  traZODone (DESYREL) 50 MG tablet, Take 50 mg by mouth at bedtime., Disp: , Rfl:   Past Medical Problems: Past Medical History:  Diagnosis Date  . Anemia    H/O  . Anemia   . Arthritis    RIGHT KNEE  . Breast cancer ([place]) 2017   right  breast  . Cancer ([place])    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)  . Dyspnea   . GERD (gastroesophageal reflux disease)    NO MEDS  . Headache    migraines  . Hypertension   . Last menstrual period (LMP) > 10 days ago 08/2015  . Lower extremity edema   . Neuropathy   . Personal history of radiation therapy     Past Surgical History: Past Surgical History:  Procedure Laterality Date  . BREAST BIOPSY Right 05/08/2017   invasive mamm. ca  . BREAST EXCISIONAL BIOPSY Right 06/15/15   breast cancer  . BREAST LUMPECTOMY WITH NEEDLE LOCALIZATION Right 06/15/2015   Procedure: BREAST LUMPECTOMY WITH NEEDLE LOCALIZATION;  Surgeon: Hubbard Robinson, MD;  Location: [facility];  Service: General;  Laterality: Right;  .  BREAST RECONSTRUCTION WITH PLACEMENT OF TISSUE EXPANDER AND FLEX HD (ACELLULAR HYDRATED DERMIS) Left 02/09/2018   Procedure: BREAST RECONSTRUCTION WITH PLACEMENT OF TISSUE EXPANDER AND FLEX HD (ACELLULAR HYDRATED DERMIS);  Surgeon: Wallace Going, DO;  Location: [facility];  Service: Plastics;  Laterality: Left;  . BREAST SURGERY    . CESAREAN SECTION  1988   x1  . COLONOSCOPY WITH PROPOFOL N/A 01/23/2017   Procedure: COLONOSCOPY WITH PROPOFOL;  Surgeon: Lin Landsman, MD;  Location: [facility] ENDOSCOPY;  Service: Gastroenterology;  Laterality: N/A;  . ESOPHAGOGASTRODUODENOSCOPY (EGD) WITH PROPOFOL N/A 01/23/2017   Procedure: ESOPHAGOGASTRODUODENOSCOPY (EGD) WITH PROPOFOL;  Surgeon: Lin Landsman, MD;  Location: [facility] ENDOSCOPY;  Service: Gastroenterology;  Laterality: N/A;  . LATISSIMUS FLAP TO BREAST Right 06/01/2018   Procedure: RIGHT BREAST RECONSTRUCTION WITH LATISSIMUS MYOCUTANEOUS FLAP;  Surgeon: Wallace Going, DO;  Location: [place];  Service: Plastics;  Laterality: Right;  . MASS EXCISION N/A 05/20/2016   Procedure: EXCISION CHEST WALL MASS;  Surgeon: Florene Glen, MD;  Location: [facility];  Service: General;  Laterality: N/A;  . RECONSTRUCTION BREAST W/ LATISSIMUS DORSI FLAP Right 06/01/2018  . SENTINEL NODE BIOPSY Right 06/15/2015   Procedure: SENTINEL NODE BIOPSY;  Surgeon: Hubbard Robinson, MD;  Location: [facility];  Service: General;  Laterality: Right;  . TOTAL MASTECTOMY Right 05/22/2017   Procedure: TOTAL MASTECTOMY;  Surgeon: Florene Glen, MD;  Location: [facility];  Service: General;  Laterality: Right;  . TOTAL MASTECTOMY Left 02/09/2018   Procedure: LEFT PROPHYLACTIC  MASTECTOMY;  Surgeon: Jovita Kussmaul, MD;  Location: [facility];  Service: General;  Laterality: Left;  . TUBAL LIGATION  2004     Objective:   Vital Signs BP (!) 144/91 (BP Location: Left Arm, Patient Position: Sitting, Cuff Size: Large)   Pulse 65   Temp 98.2 F (36.8 C) (Oral)   Ht  5\' 9"  (1.753 m)   Wt (!) 302 lb (137 kg)   SpO2 94%   BMI 44.60 kg/m  Vital Signs and Nursing Note Reviewed  Physical Exam  Constitutional: She is oriented to person, place, and time and well-developed, well-nourished, and in no distress.  HENT:  Head: Normocephalic and atraumatic.  Neck: Normal range of motion.  Cardiovascular: Normal rate and regular rhythm.  Pulmonary/Chest: Effort normal.  Symmetric rise and fall of chest wall.   Neurological: She is alert and oriented to person, place, and time. Gait normal.  Skin: Skin is warm and dry. No rash noted. She is not diaphoretic. No erythema.  Psychiatric: Mood and affect normal.      Assessment/Plan:     ICD-10-CM   1. S/P mastectomy, bilateral  Z90.13   2.  S/P breast reconstruction, bilateral  Z98.890     We placed injectable saline in the Expander using a sterile technique: Right: 50 cc for a total of 650 / 535 cc Left: 00 cc for a total of 770 / 535 cc  She is doing well and is scheduled for surgery on October 15, 2018. Continue to eat healthy, drink plenty of fluids and take a multivitamin.    Carola Rhine Olman Yono, PA-C 09/23/2018, 3:31 PM

## 2018-09-25 ENCOUNTER — Ambulatory Visit: Payer: Medicaid Other | Admitting: Plastic Surgery

## 2018-10-07 ENCOUNTER — Encounter (HOSPITAL_BASED_OUTPATIENT_CLINIC_OR_DEPARTMENT_OTHER): Payer: Self-pay | Admitting: *Deleted

## 2018-10-07 ENCOUNTER — Other Ambulatory Visit: Payer: Self-pay

## 2018-10-08 ENCOUNTER — Telehealth: Payer: Self-pay | Admitting: Plastic Surgery

## 2018-10-08 NOTE — Progress Notes (Signed)
Patient ID: Michelle Mcmillan, female    DOB: 1970/05/27, 48 y.o.   MRN: [id number]    ICD-10-CM   1. S/P breast reconstruction, bilateral  Z98.890   2. Acquired absence of right breast  Z90.11   3. Acquired absence of left breast  Z90.12      History of Present Illness: Michelle Mcmillan is a 48 y.o.  female presenting for preoperative history and physical exam with her husband. She is scheduled for removal of bilateral tissue expanders with placement of bilateral breast implants, with possible liposuction to bilateral breasts on 10/15/18 with [name]. At her last visit, we expanded her right breast to 650/535 cc. Her left breast expander is currently sized at 770/535 cc.  She is very pleased with the current size of her expanders. She is aware that the implants may appear smaller despite being the same size cc due to shape/material differences. She would like saline implants.  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  She is waiting to have a nerve conduction test done.   Denies any recent colds, fevers or illnesses.  She does not report any history of issues with anesthesia. She has been quarantining with her husband,   Past Medical History: Allergies: Allergies  Allergen Reactions  . Nitroglycerin Other (See Comments)    Headache, n/v.    Current Medications:  Current Outpatient Medications:  .  diclofenac sodium (VOLTAREN) 1 % GEL, Apply 4 g topically 4 (four) times daily., Disp: , Rfl:  .  escitalopram (LEXAPRO) 10 MG tablet, Take 10 mg by mouth daily., Disp: , Rfl:  .  Ferrous Sulfate (IRON) 325 (65 Fe) MG TABS, Take 1 tablet (325 mg total) by mouth 2 (two) times daily., Disp: 60 each, Rfl: 2 .  gabapentin (NEURONTIN) 300 MG capsule, Take 1 capsule (300 mg total) by mouth 3 (three) times daily. May take 2 caps (600 mg) at bedtime if more bothersome at night, Disp: 90 capsule, Rfl: 1 .  hydrochlorothiazide (HYDRODIURIL) 25 MG tablet, Take  25 mg by mouth daily., Disp: , Rfl:  .  letrozole (FEMARA) 2.5 MG tablet, Take 1 tablet (2.5 mg total) by mouth daily., Disp: 30 tablet, Rfl: 3 .  omeprazole (PRILOSEC) 20 MG capsule, Take 1 capsule (20 mg total) by mouth daily., Disp: 30 capsule, Rfl: 3 .  potassium chloride SA (K-DUR,KLOR-CON) 20 MEQ tablet, Take 1 tablet (20 mEq total) by mouth 2 (two) times daily., Disp: 30 tablet, Rfl: 1 .  tetrahydrozoline 0.05 % ophthalmic solution, Place 2 drops into both eyes daily., Disp: , Rfl:  .  traZODone (DESYREL) 50 MG tablet, Take 50 mg by mouth at bedtime., Disp: , Rfl:   Past Medical Problems: Past Medical History:  Diagnosis Date  . Anemia    H/O  . Anemia   . Arthritis    RIGHT KNEE  . Breast cancer ([place]) 2017   right breast  . Cancer ([place])    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)  . Dyspnea   . GERD (gastroesophageal reflux disease)    NO MEDS  . Headache    migraines  . Hypertension   . Last menstrual period (LMP) > 10 days ago 08/2015  . Lower extremity edema   . Neuropathy   . Personal history of radiation therapy     Past Surgical History: Past Surgical History:  Procedure Laterality Date  . BREAST BIOPSY Right 05/08/2017   invasive mamm. ca  . BREAST EXCISIONAL BIOPSY  Right 06/15/15   breast cancer  . BREAST LUMPECTOMY WITH NEEDLE LOCALIZATION Right 06/15/2015   Procedure: BREAST LUMPECTOMY WITH NEEDLE LOCALIZATION;  Surgeon: Hubbard Robinson, MD;  Location: [facility];  Service: General;  Laterality: Right;  . BREAST RECONSTRUCTION WITH PLACEMENT OF TISSUE EXPANDER AND FLEX HD (ACELLULAR HYDRATED DERMIS) Left 02/09/2018   Procedure: BREAST RECONSTRUCTION WITH PLACEMENT OF TISSUE EXPANDER AND FLEX HD (ACELLULAR HYDRATED DERMIS);  Surgeon: Wallace Going, DO;  Location: [facility];  Service: Plastics;  Laterality: Left;  . BREAST SURGERY    . CESAREAN SECTION  1988   x1  . COLONOSCOPY WITH PROPOFOL N/A 01/23/2017   Procedure: COLONOSCOPY WITH PROPOFOL;  Surgeon: Lin Landsman, MD;  Location[facility] ENDOSCOPY;  Service: Gastroenterology;  Laterality: N/A;  . ESOPHAGOGASTRODUODENOSCOPY (EGD) WITH PROPOFOL N/A 01/23/2017   Procedure: ESOPHAGOGASTRODUODENOSCOPY (EGD) WITH PROPOFOL;  Surgeon: Lin Landsman, MD;  Location: [facility] ENDOSCOPY;  Service: Gastroenterology;  Laterality: N/A;  . LATISSIMUS FLAP TO BREAST Right 06/01/2018   Procedure: RIGHT BREAST RECONSTRUCTION WITH LATISSIMUS MYOCUTANEOUS FLAP;  Surgeon: Wallace Going, DO;  Location: [place];  Service: Plastics;  Laterality: Right;  . MASS EXCISION N/A 05/20/2016   Procedure: EXCISION CHEST WALL MASS;  Surgeon: Florene Glen, MD;  Location: [facility];  Service: General;  Laterality: N/A;  . RECONSTRUCTION BREAST W/ LATISSIMUS DORSI FLAP Right 06/01/2018  . SENTINEL NODE BIOPSY Right 06/15/2015   Procedure: SENTINEL NODE BIOPSY;  Surgeon: Hubbard Robinson, MD;  Location: [facility];  Service: General;  Laterality: Right;  . TOTAL MASTECTOMY Right 05/22/2017   Procedure: TOTAL MASTECTOMY;  Surgeon: Florene Glen, MD;  Location: [facility];  Service: General;  Laterality: Right;  . TOTAL MASTECTOMY Left 02/09/2018   Procedure: LEFT PROPHYLACTIC  MASTECTOMY;  Surgeon: Jovita Kussmaul, MD;  Location: [facility];  Service: General;  Laterality: Left;  . TUBAL LIGATION  2004    Social History: Social History   Socioeconomic History  . Marital status: Married    Spouse name: Not on file  . Number of children: Not on file  . Years of education: Not on file  . Highest education level: Not on file  Occupational History  . Not on file  Social Needs  . Financial resource strain: Not on file  . Food insecurity    Worry: Not on file    Inability: Not on file  . Transportation needs    Medical: Not on file    Non-medical: Not on file  Tobacco Use  . Smoking status: Never Smoker  . Smokeless tobacco: Never Used  Substance and Sexual Activity  . Alcohol use: Not Currently    Comment: WINE ONCE  MONTH  . Drug use: No  . Sexual activity: Not on file  Lifestyle  . Physical activity    Days per week: Not on file    Minutes per session: Not on file  . Stress: Not on file  Relationships  . Social Herbalist on phone: Not on file    Gets together: Not on file    Attends religious service: Not on file    Active member of club or organization: Not on file    Attends meetings of clubs or organizations: Not on file    Relationship status: Not on file  . Intimate partner violence    Fear of current or ex partner: Not on file    Emotionally abused: Not on file    Physically  abused: Not on file    Forced sexual activity: Not on file  Other Topics Concern  . Not on file  Social History Narrative  . Not on file    Family History: Family History  Problem Relation Age of Onset  . Hypertension Mother   . Hypertension Father   . Hypertension Sister   . Breast cancer Neg Hx     Review of Systems: Review of Systems  Constitutional: Negative for chills, diaphoresis and fever.  HENT: Negative.   Eyes: Negative.   Respiratory: Negative.  Negative for cough, shortness of breath and wheezing.   Cardiovascular: Negative.  Negative for chest pain, palpitations and leg swelling.  Gastrointestinal: Negative.   Genitourinary: Negative.   Musculoskeletal: Negative.   Skin: Negative for itching and rash.  Neurological: Negative.   Psychiatric/Behavioral: Negative.     Physical Exam: Vital Signs BP 137/89 (BP Location: Left Arm, Patient Position: Sitting, Cuff Size: Large)   Pulse 63   Temp 98.5 F (36.9 C) (Temporal)   Ht 5\' 9"  (1.753 m)   Wt 297 lb (134.7 kg)   SpO2 97%   BMI 43.86 kg/m  Vitals and Nursing note reviewed.  Physical Exam Exam conducted with a chaperone present.  Constitutional:      General: She is not in acute distress.    Appearance: Normal appearance. She is not ill-appearing.  HENT:     Head: Normocephalic and atraumatic.  Eyes:      Pupils: Pupils are equal, round Neck:     Musculoskeletal: Normal range of motion.  Cardiovascular:     Rate and Rhythm: Normal rate and regular rhythm.     Pulses: Normal pulses.     Heart sounds: Normal heart sounds. No murmur.  Pulmonary:     Effort: Pulmonary effort is normal. No respiratory distress.     Breath sounds: Normal breath sounds. No wheezing.  Abdominal:     General: Abdomen is flat. There is no distension.     Palpations: Abdomen is soft.     Tenderness: There is no abdominal tenderness.  Musculoskeletal: Normal range of motion.  Skin:    General: Skin is warm and dry.     Findings: No erythema or rash.  Neurological:     General: No focal deficit present.     Mental Status: She is alert and oriented to person, place, and time. Mental status is at baseline.     Motor: No weakness.  Psychiatric:        Mood and Affect: Mood normal.        Behavior: Behavior normal.   Assessment/Plan: Plan for removal of bilateral tissue expanders with placement of bilateral breast implants with possible liposuction to bilateral breasts on 10/15/2018 with [name].  The risks, benefits, and alternatives of procedure discussed, questions answered and consent obtained.    Mrs. Leaman is doing well.  All of her incisions from her expander placement are healed well.  She denies any recent illnesses or colds.  She continues to quarantine with her husband.  All of her questions were answered.  Instructed to call with any further questions or concerns prior to surgery.   The risks that can be encountered with and after exchange of breast expander to implants were discussed and include the following but not limited to these: bleeding, infection, delayed healing, anesthesia risks, skin sensation changes, injury to structures including nerves, blood vessels, and muscles which may be temporary or permanent, allergies to tape, suture materials  and glues, blood products, topical preparations  or injected agents, skin contour irregularities, skin discoloration and swelling, deep vein thrombosis, cardiac and pulmonary complications, pain, which may persist, fluid accumulation, wrinkling of the skin, changes in nipple or breast sensation, implant leakage or rupture, faulty position of the expander, persistent pain, formation of tight scar tissue around the implant (capsular contracture), possible need for revisional surgery or staged procedures.   Electronically signed by: Carola Rhine Jeyden Coffelt, PA-C 10/09/2018 10:12 AM

## 2018-10-08 NOTE — Telephone Encounter (Signed)

## 2018-10-08 NOTE — H&P (View-Only) (Signed)
Patient ID: Michelle Mcmillan, female    DOB: [birth year], 48 y.o.   MRN: [id number]    ICD-10-CM   1. S/P breast reconstruction, bilateral  Z98.890   2. Acquired absence of right breast  Z90.11   3. Acquired absence of left breast  Z90.12      History of Present Illness: Michelle Mcmillan is a 48 y.o.  female presenting for preoperative history and physical exam with her husband. She is scheduled for removal of bilateral tissue expanders with placement of bilateral breast implants, with possible liposuction to bilateral breasts on 10/15/18 with [name]. At her last visit, we expanded her right breast to 650/535 cc. Her left breast expander is currently sized at 770/535 cc.  She is very pleased with the current size of her expanders. She is aware that the implants may appear smaller despite being the same size cc due to shape/material differences. She would like saline implants.  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  She is waiting to have a nerve conduction test done.   Denies any recent colds, fevers or illnesses.  She does not report any history of issues with anesthesia. She has been quarantining with her husband,   Past Medical History: Allergies: Allergies  Allergen Reactions  . Nitroglycerin Other (See Comments)    Headache, n/v.    Current Medications:  Current Outpatient Medications:  .  diclofenac sodium (VOLTAREN) 1 % GEL, Apply 4 g topically 4 (four) times daily., Disp: , Rfl:  .  escitalopram (LEXAPRO) 10 MG tablet, Take 10 mg by mouth daily., Disp: , Rfl:  .  Ferrous Sulfate (IRON) 325 (65 Fe) MG TABS, Take 1 tablet (325 mg total) by mouth 2 (two) times daily., Disp: 60 each, Rfl: 2 .  gabapentin (NEURONTIN) 300 MG capsule, Take 1 capsule (300 mg total) by mouth 3 (three) times daily. May take 2 caps (600 mg) at bedtime if more bothersome at night, Disp: 90 capsule, Rfl: 1 .  hydrochlorothiazide (HYDRODIURIL) 25 MG tablet, Take  25 mg by mouth daily., Disp: , Rfl:  .  letrozole (FEMARA) 2.5 MG tablet, Take 1 tablet (2.5 mg total) by mouth daily., Disp: 30 tablet, Rfl: 3 .  omeprazole (PRILOSEC) 20 MG capsule, Take 1 capsule (20 mg total) by mouth daily., Disp: 30 capsule, Rfl: 3 .  potassium chloride SA (K-DUR,KLOR-CON) 20 MEQ tablet, Take 1 tablet (20 mEq total) by mouth 2 (two) times daily., Disp: 30 tablet, Rfl: 1 .  tetrahydrozoline 0.05 % ophthalmic solution, Place 2 drops into both eyes daily., Disp: , Rfl:  .  traZODone (DESYREL) 50 MG tablet, Take 50 mg by mouth at bedtime., Disp: , Rfl:   Past Medical Problems: Past Medical History:  Diagnosis Date  . Anemia    H/O  . Anemia   . Arthritis    RIGHT KNEE  . Breast cancer ([place]) 2017   right breast  . Cancer ([place])    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)  . Dyspnea   . GERD (gastroesophageal reflux disease)    NO MEDS  . Headache    migraines  . Hypertension   . Last menstrual period (LMP) > 10 days ago 08/2015  . Lower extremity edema   . Neuropathy   . Personal history of radiation therapy     Past Surgical History: Past Surgical History:  Procedure Laterality Date  . BREAST BIOPSY Right 05/08/2017   invasive mamm. ca  . BREAST EXCISIONAL BIOPSY  Right 06/15/15   breast cancer  . BREAST LUMPECTOMY WITH NEEDLE LOCALIZATION Right 06/15/2015   Procedure: BREAST LUMPECTOMY WITH NEEDLE LOCALIZATION;  Surgeon: Hubbard Robinson, MD;  Location: [facility];  Service: General;  Laterality: Right;  . BREAST RECONSTRUCTION WITH PLACEMENT OF TISSUE EXPANDER AND FLEX HD (ACELLULAR HYDRATED DERMIS) Left 02/09/2018   Procedure: BREAST RECONSTRUCTION WITH PLACEMENT OF TISSUE EXPANDER AND FLEX HD (ACELLULAR HYDRATED DERMIS);  Surgeon: Wallace Going, DO;  Location: [facility];  Service: Plastics;  Laterality: Left;  . BREAST SURGERY    . CESAREAN SECTION  1988   x1  . COLONOSCOPY WITH PROPOFOL N/A 01/23/2017   Procedure: COLONOSCOPY WITH PROPOFOL;  Surgeon: Lin Landsman, MD;  Location: [facility] END[facility]COPY;  Service: Gastroenterology;  Laterality: N/A;  . ESOPHAGOGASTRODUODENOSCOPY (EGD) WITH PROPOFOL N/A 01/23/2017   Procedure: ESOPHAGOGASTRODUODENOSCOPY (EGD) WITH PROPOFOL;  Surgeon: Lin Landsman, MD;  Location: [facility] ENDOSCOPY;  Service: Gastroenterology;  Laterality: N/A;  . LATISSIMUS FLAP TO BREAST Right 06/01/2018   Procedure: RIGHT BREAST RECONSTRUCTION WITH LATISSIMUS MYOCUTANEOUS FLAP;  Surgeon: Wallace Going, DO;  Location: [place];  Service: Plastics;  Laterality: Right;  . MASS EXCISION N/A 05/20/2016   Procedure: EXCISION CHEST WALL MASS;  Surgeon: Florene Glen, MD;  Location: [facility];  Service: General;  Laterality: N/A;  . RECONSTRUCTION BREAST W/ LATISSIMUS DORSI FLAP Right 06/01/2018  . SENTINEL NODE BIOPSY Right 06/15/2015   Procedure: SENTINEL NODE BIOPSY;  Surgeon: Hubbard Robinson, MD;  Location: [facility];  Service: General;  Laterality: Right;  . TOTAL MASTECTOMY Right 05/22/2017   Procedure: TOTAL MASTECTOMY;  Surgeon: Florene Glen, MD;  Location: [facility];  Service: General;  Laterality: Right;  . TOTAL MASTECTOMY Left 02/09/2018   Procedure: LEFT PROPHYLACTIC  MASTECTOMY;  Surgeon: Jovita Kussmaul, MD;  Location: [facility];  Service: General;  Laterality: Left;  . TUBAL LIGATION  2004    Social History: Social History   Socioeconomic History  . Marital status: Married    Spouse name: Not on file  . Number of children: Not on file  . Years of education: Not on file  . Highest education level: Not on file  Occupational History  . Not on file  Social Needs  . Financial resource strain: Not on file  . Food insecurity    Worry: Not on file    Inability: Not on file  . Transportation needs    Medical: Not on file    Non-medical: Not on file  Tobacco Use  . Smoking status: Never Smoker  . Smokeless tobacco: Never Used  Substance and Sexual Activity  . Alcohol use: Not Currently    Comment: WINE ONCE  MONTH  . Drug use: No  . Sexual activity: Not on file  Lifestyle  . Physical activity    Days per week: Not on file    Minutes per session: Not on file  . Stress: Not on file  Relationships  . Social Herbalist on phone: Not on file    Gets together: Not on file    Attends religious service: Not on file    Active member of club or organization: Not on file    Attends meetings of clubs or organizations: Not on file    Relationship status: Not on file  . Intimate partner violence    Fear of current or ex partner: Not on file    Emotionally abused: Not on file    Physically  abused: Not on file    Forced sexual activity: Not on file  Other Topics Concern  . Not on file  Social History Narrative  . Not on file    Family History: Family History  Problem Relation Age of Onset  . Hypertension Mother   . Hypertension Father   . Hypertension Sister   . Breast cancer Neg Hx     Review of Systems: Review of Systems  Constitutional: Negative for chills, diaphoresis and fever.  HENT: Negative.   Eyes: Negative.   Respiratory: Negative.  Negative for cough, shortness of breath and wheezing.   Cardiovascular: Negative.  Negative for chest pain, palpitations and leg swelling.  Gastrointestinal: Negative.   Genitourinary: Negative.   Musculoskeletal: Negative.   Skin: Negative for itching and rash.  Neurological: Negative.   Psychiatric/Behavioral: Negative.     Physical Exam: Vital Signs BP 137/89 (BP Location: Left Arm, Patient Position: Sitting, Cuff Size: Large)   Pulse 63   Temp 98.5 F (36.9 C) (Temporal)   Ht 5\' 9"  (1.753 m)   Wt 297 lb (134.7 kg)   SpO2 97%   BMI 43.86 kg/m  Vitals and Nursing note reviewed.  Physical Exam Exam conducted with a chaperone present.  Constitutional:      General: She is not in acute distress.    Appearance: Normal appearance. She is not ill-appearing.  HENT:     Head: Normocephalic and atraumatic.  Eyes:      Pupils: Pupils are equal, round Neck:     Musculoskeletal: Normal range of motion.  Cardiovascular:     Rate and Rhythm: Normal rate and regular rhythm.     Pulses: Normal pulses.     Heart sounds: Normal heart sounds. No murmur.  Pulmonary:     Effort: Pulmonary effort is normal. No respiratory distress.     Breath sounds: Normal breath sounds. No wheezing.  Abdominal:     General: Abdomen is flat. There is no distension.     Palpations: Abdomen is soft.     Tenderness: There is no abdominal tenderness.  Musculoskeletal: Normal range of motion.  Skin:    General: Skin is warm and dry.     Findings: No erythema or rash.  Neurological:     General: No focal deficit present.     Mental Status: She is alert and oriented to person, place, and time. Mental status is at baseline.     Motor: No weakness.  Psychiatric:        Mood and Affect: Mood normal.        Behavior: Behavior normal.   Assessment/Plan: Plan for removal of bilateral tissue expanders with placement of bilateral breast implants with possible liposuction to bilateral breasts on 10/15/2018 with [name].  The risks, benefits, and alternatives of procedure discussed, questions answered and consent obtained.    Mrs. Delao is doing well.  All of her incisions from her expander placement are healed well.  She denies any recent illnesses or colds.  She continues to quarantine with her husband.  All of her questions were answered.  Instructed to call with any further questions or concerns prior to surgery.   The risks that can be encountered with and after exchange of breast expander to implants were discussed and include the following but not limited to these: bleeding, infection, delayed healing, anesthesia risks, skin sensation changes, injury to structures including nerves, blood vessels, and muscles which may be temporary or permanent, allergies to tape, suture materials  and glues, blood products, topical preparations  or injected agents, skin contour irregularities, skin discoloration and swelling, deep vein thrombosis, cardiac and pulmonary complications, pain, which may persist, fluid accumulation, wrinkling of the skin, changes in nipple or breast sensation, implant leakage or rupture, faulty position of the expander, persistent pain, formation of tight scar tissue around the implant (capsular contracture), possible need for revisional surgery or staged procedures.   Electronically signed by: Carola Rhine Javoris Star, PA-C 10/09/2018 10:12 AM

## 2018-10-09 ENCOUNTER — Encounter: Payer: Self-pay | Admitting: Surgical

## 2018-10-09 ENCOUNTER — Ambulatory Visit (INDEPENDENT_AMBULATORY_CARE_PROVIDER_SITE_OTHER): Payer: Medicaid Other | Admitting: Surgical

## 2018-10-09 ENCOUNTER — Encounter (HOSPITAL_BASED_OUTPATIENT_CLINIC_OR_DEPARTMENT_OTHER)
Admission: RE | Admit: 2018-10-09 | Discharge: 2018-10-09 | Disposition: A | Discharge status: Home / Self Care | Payer: Medicaid Other | Source: Ambulatory Visit | Attending: Plastic Surgery | Admitting: Plastic Surgery

## 2018-10-09 ENCOUNTER — Other Ambulatory Visit: Payer: Self-pay

## 2018-10-09 VITALS — BP 137/89 | HR 63 | Temp 98.5°F | Ht 69.0 in | Wt 297.0 lb

## 2018-10-09 DIAGNOSIS — Z9011 Acquired absence of right breast and nipple: Secondary | ICD-10-CM

## 2018-10-09 DIAGNOSIS — Z9889 Other specified postprocedural states: Secondary | ICD-10-CM

## 2018-10-09 DIAGNOSIS — Z01812 Encounter for preprocedural laboratory examination: Secondary | ICD-10-CM | POA: Insufficient documentation

## 2018-10-09 DIAGNOSIS — Z9012 Acquired absence of left breast and nipple: Secondary | ICD-10-CM

## 2018-10-09 LAB — BASIC METABOLIC PANEL
Anion gap: 9 (ref 5–15)
BUN: 19 mg/dL (ref 6–20)
CO2: 30 mmol/L (ref 22–32)
Calcium: 9.3 mg/dL (ref 8.9–10.3)
Chloride: 102 mmol/L (ref 98–111)
Creatinine, Ser: 0.96 mg/dL (ref 0.44–1.00)
GFR calc Af Amer: >60 mL/min (ref >60)
GFR calc non Af Amer: >60 mL/min (ref >60)
Glucose, Bld: 91 mg/dL (ref 70–99)
Potassium: 3.9 mmol/L (ref 3.5–5.1)
Sodium: 141 mmol/L (ref 135–145)

## 2018-10-09 LAB — POCT PREGNANCY, URINE: Preg Test, Ur: NEGATIVE

## 2018-10-09 MED ORDER — CEPHALEXIN 500 MG PO CAPS: 500.0000 mg | ORAL_CAPSULE | Freq: Four times a day (QID) | ORAL | 0 refills | Status: AC

## 2018-10-09 MED ORDER — ONDANSETRON HCL 4 MG PO TABS: 4.0000 mg | ORAL_TABLET | Freq: Three times a day (TID) | ORAL | 0 refills | Status: AC | PRN

## 2018-10-09 MED ORDER — HYDROCODONE-ACETAMINOPHEN 5-325 MG PO TABS: 1.0000 | ORAL_TABLET | Freq: Four times a day (QID) | ORAL | 0 refills | Status: DC | PRN

## 2018-10-09 MED ORDER — DIAZEPAM 2 MG PO TABS: 2.0000 mg | ORAL_TABLET | Freq: Two times a day (BID) | ORAL | 0 refills | Status: DC | PRN

## 2018-10-09 NOTE — Addendum Note (Signed)
Addended by: Wallace Going on: 10/09/2018 03:46 PM   Modules accepted: Orders

## 2018-10-09 NOTE — Progress Notes (Signed)
Surgical soap given with instructions, pt verbalized understanding.  

## 2018-10-12 ENCOUNTER — Other Ambulatory Visit: Payer: Self-pay

## 2018-10-12 ENCOUNTER — Other Ambulatory Visit
Admission: RE | Admit: 2018-10-12 | Discharge: 2018-10-12 | Disposition: A | Discharge status: Home / Self Care | Payer: Medicaid Other | Source: Ambulatory Visit | Attending: Plastic Surgery | Admitting: Plastic Surgery

## 2018-10-12 ENCOUNTER — Other Ambulatory Visit (HOSPITAL_COMMUNITY): Payer: Medicaid Other

## 2018-10-12 DIAGNOSIS — Z1159 Encounter for screening for other viral diseases: Secondary | ICD-10-CM | POA: Insufficient documentation

## 2018-10-13 LAB — SARS CORONAVIRUS 2 (TAT 6-24 HRS): SARS Coronavirus 2: NEGATIVE

## 2018-10-15 ENCOUNTER — Encounter (HOSPITAL_BASED_OUTPATIENT_CLINIC_OR_DEPARTMENT_OTHER)
Admission: RE | Disposition: A | Discharge status: Home / Self Care | Payer: Self-pay | Source: Home / Self Care | Attending: Plastic Surgery

## 2018-10-15 ENCOUNTER — Ambulatory Visit (HOSPITAL_BASED_OUTPATIENT_CLINIC_OR_DEPARTMENT_OTHER)
Admission: RE | Admit: 2018-10-15 | Discharge: 2018-10-15 | Disposition: A | Discharge status: Home / Self Care | Payer: Medicaid Other | Attending: Plastic Surgery | Admitting: Plastic Surgery

## 2018-10-15 ENCOUNTER — Encounter (HOSPITAL_BASED_OUTPATIENT_CLINIC_OR_DEPARTMENT_OTHER): Payer: Self-pay | Admitting: Certified Registered"

## 2018-10-15 ENCOUNTER — Ambulatory Visit (HOSPITAL_BASED_OUTPATIENT_CLINIC_OR_DEPARTMENT_OTHER): Payer: Medicaid Other | Admitting: Anesthesiology

## 2018-10-15 DIAGNOSIS — Z791 Long term (current) use of non-steroidal anti-inflammatories (NSAID): Secondary | ICD-10-CM | POA: Diagnosis not present

## 2018-10-15 DIAGNOSIS — G629 Polyneuropathy, unspecified: Secondary | ICD-10-CM | POA: Diagnosis not present

## 2018-10-15 DIAGNOSIS — Z79899 Other long term (current) drug therapy: Secondary | ICD-10-CM | POA: Diagnosis not present

## 2018-10-15 DIAGNOSIS — R0600 Dyspnea, unspecified: Secondary | ICD-10-CM | POA: Diagnosis not present

## 2018-10-15 DIAGNOSIS — K219 Gastro-esophageal reflux disease without esophagitis: Secondary | ICD-10-CM | POA: Insufficient documentation

## 2018-10-15 DIAGNOSIS — Z6841 Body Mass Index (BMI) 40.0 and over, adult: Secondary | ICD-10-CM | POA: Insufficient documentation

## 2018-10-15 DIAGNOSIS — Z8249 Family history of ischemic heart disease and other diseases of the circulatory system: Secondary | ICD-10-CM | POA: Insufficient documentation

## 2018-10-15 DIAGNOSIS — Z421 Encounter for breast reconstruction following mastectomy: Secondary | ICD-10-CM | POA: Insufficient documentation

## 2018-10-15 DIAGNOSIS — Z888 Allergy status to other drugs, medicaments and biological substances status: Secondary | ICD-10-CM | POA: Insufficient documentation

## 2018-10-15 DIAGNOSIS — Z923 Personal history of irradiation: Secondary | ICD-10-CM | POA: Diagnosis not present

## 2018-10-15 DIAGNOSIS — G43909 Migraine, unspecified, not intractable, without status migrainosus: Secondary | ICD-10-CM | POA: Insufficient documentation

## 2018-10-15 DIAGNOSIS — Z853 Personal history of malignant neoplasm of breast: Secondary | ICD-10-CM | POA: Insufficient documentation

## 2018-10-15 DIAGNOSIS — I10 Essential (primary) hypertension: Secondary | ICD-10-CM | POA: Insufficient documentation

## 2018-10-15 DIAGNOSIS — Z9013 Acquired absence of bilateral breasts and nipples: Secondary | ICD-10-CM | POA: Diagnosis not present

## 2018-10-15 DIAGNOSIS — M1711 Unilateral primary osteoarthritis, right knee: Secondary | ICD-10-CM | POA: Diagnosis not present

## 2018-10-15 HISTORY — PX: LIPOSUCTION WITH LIPOFILLING: SHX6436

## 2018-10-15 HISTORY — PX: REMOVAL OF BILATERAL TISSUE EXPANDERS WITH PLACEMENT OF BILATERAL BREAST IMPLANTS: SHX6431

## 2018-10-15 SURGERY — REMOVAL, TISSUE EXPANDER, BREAST, BILATERAL, WITH BILATERAL IMPLANT IMPLANT INSERTION
Anesthesia: General | Site: Breast | Laterality: Bilateral

## 2018-10-15 MED ORDER — ACETAMINOPHEN 325 MG PO TABS: 650.0000 mg | ORAL_TABLET | ORAL | Status: DC | PRN

## 2018-10-15 MED ORDER — EPHEDRINE SULFATE 50 MG/ML IJ SOLN
INTRAMUSCULAR | Status: DC | PRN
  Administered 2018-10-15: 10 mg via INTRAVENOUS

## 2018-10-15 MED ORDER — SUGAMMADEX SODIUM 500 MG/5ML IV SOLN
INTRAVENOUS | Status: AC
  Filled 2018-10-15: qty 5

## 2018-10-15 MED ORDER — CHLORHEXIDINE GLUCONATE CLOTH 2 % EX PADS: 6.0000 | MEDICATED_PAD | Freq: Once | CUTANEOUS | Status: DC

## 2018-10-15 MED ORDER — DEXAMETHASONE SODIUM PHOSPHATE 4 MG/ML IJ SOLN
INTRAMUSCULAR | Status: DC | PRN
  Administered 2018-10-15: 8 mg via INTRAVENOUS

## 2018-10-15 MED ORDER — FENTANYL CITRATE (PF) 100 MCG/2ML IJ SOLN
INTRAMUSCULAR | Status: AC
  Filled 2018-10-15: qty 2

## 2018-10-15 MED ORDER — OXYCODONE HCL 5 MG PO TABS: 5.0000 mg | ORAL_TABLET | ORAL | Status: DC | PRN

## 2018-10-15 MED ORDER — FENTANYL CITRATE (PF) 100 MCG/2ML IJ SOLN
50.0000 ug | INTRAMUSCULAR | Status: AC | PRN
  Administered 2018-10-15 (×2): 50 ug via INTRAVENOUS
  Administered 2018-10-15: 100 ug via INTRAVENOUS

## 2018-10-15 MED ORDER — ROCURONIUM BROMIDE 10 MG/ML (PF) SYRINGE
PREFILLED_SYRINGE | INTRAVENOUS | Status: AC
  Filled 2018-10-15: qty 10

## 2018-10-15 MED ORDER — BUPIVACAINE-EPINEPHRINE 0.25% -1:200000 IJ SOLN
INTRAMUSCULAR | Status: DC | PRN
  Administered 2018-10-15: 14 mL

## 2018-10-15 MED ORDER — SUCCINYLCHOLINE CHLORIDE 200 MG/10ML IV SOSY
PREFILLED_SYRINGE | INTRAVENOUS | Status: AC
  Filled 2018-10-15: qty 10

## 2018-10-15 MED ORDER — LACTATED RINGERS IV SOLN
INTRAVENOUS | Status: DC
  Administered 2018-10-15: 12:00:00 via INTRAVENOUS

## 2018-10-15 MED ORDER — ONDANSETRON HCL 4 MG/2ML IJ SOLN
INTRAMUSCULAR | Status: AC
  Filled 2018-10-15: qty 2

## 2018-10-15 MED ORDER — ROCURONIUM BROMIDE 100 MG/10ML IV SOLN
INTRAVENOUS | Status: DC | PRN
  Administered 2018-10-15: 50 mg via INTRAVENOUS
  Administered 2018-10-15: 10 mg via INTRAVENOUS

## 2018-10-15 MED ORDER — PHENYLEPHRINE 40 MCG/ML (10ML) SYRINGE FOR IV PUSH (FOR BLOOD PRESSURE SUPPORT)
PREFILLED_SYRINGE | INTRAVENOUS | Status: AC
  Filled 2018-10-15: qty 10

## 2018-10-15 MED ORDER — MIDAZOLAM HCL 2 MG/2ML IJ SOLN
1.0000 mg | INTRAMUSCULAR | Status: DC | PRN
  Administered 2018-10-15: 12:00:00 2 mg via INTRAVENOUS

## 2018-10-15 MED ORDER — SUGAMMADEX SODIUM 500 MG/5ML IV SOLN
INTRAVENOUS | Status: DC | PRN
  Administered 2018-10-15: 200 mg via INTRAVENOUS

## 2018-10-15 MED ORDER — SODIUM CHLORIDE 0.9% FLUSH: 3.0000 mL | Freq: Two times a day (BID) | INTRAVENOUS | Status: DC

## 2018-10-15 MED ORDER — CELECOXIB 200 MG PO CAPS
200.0000 mg | ORAL_CAPSULE | Freq: Once | ORAL | Status: AC
  Administered 2018-10-15: 200 mg via ORAL

## 2018-10-15 MED ORDER — EPHEDRINE 5 MG/ML INJ
INTRAVENOUS | Status: AC
  Filled 2018-10-15: qty 10

## 2018-10-15 MED ORDER — CEFAZOLIN SODIUM-DEXTROSE 2-4 GM/100ML-% IV SOLN
INTRAVENOUS | Status: AC
  Filled 2018-10-15: qty 100

## 2018-10-15 MED ORDER — DEXAMETHASONE SODIUM PHOSPHATE 10 MG/ML IJ SOLN
INTRAMUSCULAR | Status: AC
  Filled 2018-10-15: qty 1

## 2018-10-15 MED ORDER — CELECOXIB 200 MG PO CAPS
ORAL_CAPSULE | ORAL | Status: AC
  Filled 2018-10-15: qty 1

## 2018-10-15 MED ORDER — SODIUM CHLORIDE 0.9 % IV SOLN
INTRAVENOUS | Status: DC | PRN
  Administered 2018-10-15: 500 mL

## 2018-10-15 MED ORDER — ACETAMINOPHEN 650 MG RE SUPP: 650.0000 mg | RECTAL | Status: DC | PRN

## 2018-10-15 MED ORDER — SODIUM CHLORIDE 0.9% FLUSH: 3.0000 mL | INTRAVENOUS | Status: DC | PRN

## 2018-10-15 MED ORDER — PROMETHAZINE HCL 25 MG/ML IJ SOLN: 6.2500 mg | INTRAMUSCULAR | Status: DC | PRN

## 2018-10-15 MED ORDER — ACETAMINOPHEN 500 MG PO TABS
1000.0000 mg | ORAL_TABLET | Freq: Once | ORAL | Status: AC
  Administered 2018-10-15: 1000 mg via ORAL

## 2018-10-15 MED ORDER — GABAPENTIN 300 MG PO CAPS: 300.0000 mg | ORAL_CAPSULE | Freq: Once | ORAL | Status: DC

## 2018-10-15 MED ORDER — ONDANSETRON HCL 4 MG/2ML IJ SOLN
INTRAMUSCULAR | Status: DC | PRN
  Administered 2018-10-15: 4 mg via INTRAVENOUS

## 2018-10-15 MED ORDER — LIDOCAINE 2% (20 MG/ML) 5 ML SYRINGE
INTRAMUSCULAR | Status: AC
  Filled 2018-10-15: qty 5

## 2018-10-15 MED ORDER — ACETAMINOPHEN 500 MG PO TABS
ORAL_TABLET | ORAL | Status: AC
  Filled 2018-10-15: qty 2

## 2018-10-15 MED ORDER — FENTANYL CITRATE (PF) 100 MCG/2ML IJ SOLN
25.0000 ug | INTRAMUSCULAR | Status: DC | PRN
  Administered 2018-10-15: 50 ug via INTRAVENOUS

## 2018-10-15 MED ORDER — LIDOCAINE HCL 1 % IJ SOLN
INTRAVENOUS | Status: DC | PRN
  Administered 2018-10-15: 200 mL

## 2018-10-15 MED ORDER — DEXTROSE 5 % IV SOLN
3.0000 g | INTRAVENOUS | Status: AC
  Administered 2018-10-15 (×2): 3 g via INTRAVENOUS

## 2018-10-15 MED ORDER — PROPOFOL 10 MG/ML IV BOLUS
INTRAVENOUS | Status: DC | PRN
  Administered 2018-10-15: 150 mg via INTRAVENOUS
  Administered 2018-10-15: 50 mg via INTRAVENOUS

## 2018-10-15 MED ORDER — SCOPOLAMINE 1 MG/3DAYS TD PT72
MEDICATED_PATCH | TRANSDERMAL | Status: AC
  Filled 2018-10-15: qty 1

## 2018-10-15 MED ORDER — SCOPOLAMINE 1 MG/3DAYS TD PT72
1.0000 | MEDICATED_PATCH | Freq: Once | TRANSDERMAL | Status: DC
  Administered 2018-10-15: 1.5 mg via TRANSDERMAL

## 2018-10-15 MED ORDER — LIDOCAINE HCL (CARDIAC) PF 100 MG/5ML IV SOSY
PREFILLED_SYRINGE | INTRAVENOUS | Status: DC | PRN
  Administered 2018-10-15: 100 mg via INTRAVENOUS

## 2018-10-15 MED ORDER — SODIUM CHLORIDE 0.9 % IV SOLN: 250.0000 mL | INTRAVENOUS | Status: DC | PRN

## 2018-10-15 MED ORDER — MIDAZOLAM HCL 2 MG/2ML IJ SOLN
INTRAMUSCULAR | Status: AC
  Filled 2018-10-15: qty 2

## 2018-10-15 SURGICAL SUPPLY — 94 items
BAG DECANTER FOR FLEXI CONT (MISCELLANEOUS) ×3 IMPLANT
BINDER ABDOMINAL  9 SM 30-45 (SOFTGOODS)
BINDER ABDOMINAL 10 UNV 27-48 (MISCELLANEOUS) IMPLANT
BINDER ABDOMINAL 12 SM 30-45 (SOFTGOODS) IMPLANT
BINDER ABDOMINAL 9 SM 30-45 (SOFTGOODS) IMPLANT
BINDER BREAST 3XL (GAUZE/BANDAGES/DRESSINGS) ×3 IMPLANT
BINDER BREAST LRG (GAUZE/BANDAGES/DRESSINGS) IMPLANT
BINDER BREAST MEDIUM (GAUZE/BANDAGES/DRESSINGS) IMPLANT
BINDER BREAST XLRG (GAUZE/BANDAGES/DRESSINGS) IMPLANT
BINDER BREAST XXLRG (GAUZE/BANDAGES/DRESSINGS) IMPLANT
BIOPATCH RED 1 DISK 7.0 (GAUZE/BANDAGES/DRESSINGS) IMPLANT
BIOPATCH RED 1IN DISK 7.0MM (GAUZE/BANDAGES/DRESSINGS)
BLADE 10 SAFETY STRL DISP (BLADE) ×9 IMPLANT
BLADE HEX COATED 2.75 (ELECTRODE) ×3 IMPLANT
BLADE SURG 15 STRL LF DISP TIS (BLADE) ×2 IMPLANT
BLADE SURG 15 STRL SS (BLADE) ×4
BNDG GAUZE ELAST 4 BULKY (GAUZE/BANDAGES/DRESSINGS) IMPLANT
CANISTER SUCT 1200ML W/VALVE (MISCELLANEOUS) ×9 IMPLANT
CHLORAPREP W/TINT 26 (MISCELLANEOUS) ×3 IMPLANT
CORD BIPOLAR FORCEPS 12FT (ELECTRODE) IMPLANT
COVER BACK TABLE REUSABLE LG (DRAPES) ×3 IMPLANT
COVER MAYO STAND REUSABLE (DRAPES) ×6 IMPLANT
COVER WAND RF STERILE (DRAPES) IMPLANT
DECANTER SPIKE VIAL GLASS SM (MISCELLANEOUS) IMPLANT
DERMABOND ADVANCED (GAUZE/BANDAGES/DRESSINGS) ×4
DERMABOND ADVANCED .7 DNX12 (GAUZE/BANDAGES/DRESSINGS) ×2 IMPLANT
DRAIN CHANNEL 19F RND (DRAIN) IMPLANT
DRAPE LAPAROSCOPIC ABDOMINAL (DRAPES) ×3 IMPLANT
DRSG PAD ABDOMINAL 8X10 ST (GAUZE/BANDAGES/DRESSINGS) ×6 IMPLANT
ELECT BLADE 4.0 EZ CLEAN MEGAD (MISCELLANEOUS) ×6
ELECT REM PT RETURN 9FT ADLT (ELECTROSURGICAL) ×3
ELECTRODE BLDE 4.0 EZ CLN MEGD (MISCELLANEOUS) ×2 IMPLANT
ELECTRODE REM PT RTRN 9FT ADLT (ELECTROSURGICAL) ×1 IMPLANT
EVACUATOR SILICONE 100CC (DRAIN) IMPLANT
EXTRACTOR CANIST REVOLVE STRL (CANNISTER) IMPLANT
GAUZE SPONGE 4X4 12PLY STRL LF (GAUZE/BANDAGES/DRESSINGS) IMPLANT
GLOVE BIO SURGEON STRL SZ 6.5 (GLOVE) ×14 IMPLANT
GLOVE BIO SURGEON STRL SZ7.5 (GLOVE) ×3 IMPLANT
GLOVE BIO SURGEONS STRL SZ 6.5 (GLOVE) ×7
GOWN STRL REUS W/ TWL LRG LVL3 (GOWN DISPOSABLE) ×2 IMPLANT
GOWN STRL REUS W/TWL LRG LVL3 (GOWN DISPOSABLE) ×4
IMPL BREAST 800CC (Breast) ×1 IMPLANT
IMPL GEL HP 590CC (Breast) ×1 IMPLANT
IMPLANT BREAST 800CC (Breast) ×3 IMPLANT
IMPLANT GEL HP 590CC (Breast) ×3 IMPLANT
IV LACTATED RINGERS 1000ML (IV SOLUTION) ×6 IMPLANT
IV NS 1000ML (IV SOLUTION)
IV NS 1000ML BAXH (IV SOLUTION) IMPLANT
IV NS 500ML (IV SOLUTION)
IV NS 500ML BAXH (IV SOLUTION) IMPLANT
KIT FILL SYSTEM UNIVERSAL (SET/KITS/TRAYS/PACK) IMPLANT
LINER CANISTER 1000CC FLEX (MISCELLANEOUS) ×6 IMPLANT
NDL SAFETY ECLIPSE 18X1.5 (NEEDLE) ×4 IMPLANT
NEEDLE FILTER BLUNT 18X 1/2SAF (NEEDLE) ×2
NEEDLE FILTER BLUNT 18X1 1/2 (NEEDLE) ×1 IMPLANT
NEEDLE HYPO 18GX1.5 SHARP (NEEDLE) ×8
NEEDLE HYPO 25X1 1.5 SAFETY (NEEDLE) IMPLANT
PACK BASIN DAY SURGERY FS (CUSTOM PROCEDURE TRAY) ×3 IMPLANT
PAD ALCOHOL SWAB (MISCELLANEOUS) ×3 IMPLANT
PENCIL BUTTON HOLSTER BLD 10FT (ELECTRODE) ×3 IMPLANT
PIN SAFETY STERILE (MISCELLANEOUS) IMPLANT
SIZER BREAST REUSE 590CC (SIZER) ×3
SIZER BREAST REUSE 650CC (SIZER) ×2
SIZER BREAST REUSE 750CC (SIZER) ×2
SIZER BREAST SIL REUSE 800CC (SIZER) ×3
SIZER BRST REUSE 590CC (SIZER) ×1 IMPLANT
SIZER BRST REUSE P6.4 650CC (SIZER) ×1 IMPLANT
SIZER BRST REUSE P6.6 750CC (SIZER) ×1 IMPLANT
SIZER BRST SIL REUSE 800CC (SIZER) ×1 IMPLANT
SLEEVE SCD COMPRESS KNEE MED (MISCELLANEOUS) ×3 IMPLANT
SPONGE LAP 18X18 RF (DISPOSABLE) ×9 IMPLANT
STRIP SUTURE WOUND CLOSURE 1/2 (SUTURE) ×9 IMPLANT
SUT MNCRL AB 4-0 PS2 18 (SUTURE) ×3 IMPLANT
SUT MON AB 3-0 SH 27 (SUTURE) ×4
SUT MON AB 3-0 SH27 (SUTURE) ×2 IMPLANT
SUT MON AB 5-0 PS2 18 (SUTURE) ×6 IMPLANT
SUT PDS AB 2-0 CT2 27 (SUTURE) IMPLANT
SUT VIC AB 3-0 SH 27 (SUTURE)
SUT VIC AB 3-0 SH 27X BRD (SUTURE) IMPLANT
SUT VICRYL 4-0 PS2 18IN ABS (SUTURE) ×6 IMPLANT
SYR 10ML LL (SYRINGE) ×12 IMPLANT
SYR 3ML 18GX1 1/2 (SYRINGE) IMPLANT
SYR 50ML LL SCALE MARK (SYRINGE) ×6 IMPLANT
SYR 5ML LUER SLIP (SYRINGE) ×3 IMPLANT
SYR BULB IRRIGATION 50ML (SYRINGE) ×3 IMPLANT
SYR CONTROL 10ML LL (SYRINGE) ×3 IMPLANT
SYR TOOMEY 50ML (SYRINGE) ×6 IMPLANT
TOWEL GREEN STERILE FF (TOWEL DISPOSABLE) ×6 IMPLANT
TUBE CONNECTING 20'X1/4 (TUBING) ×1
TUBE CONNECTING 20X1/4 (TUBING) ×2 IMPLANT
TUBING INFILTRATION IT-10001 (TUBING) ×3 IMPLANT
TUBING SET GRADUATE ASPIR 12FT (MISCELLANEOUS) ×3 IMPLANT
UNDERPAD 30X30 (UNDERPADS AND DIAPERS) ×6 IMPLANT
YANKAUER SUCT BULB TIP NO VENT (SUCTIONS) ×3 IMPLANT

## 2018-10-15 NOTE — Anesthesia Procedure Notes (Signed)
Procedure Name: Intubation Performed by: Willa Frater, CRNA Pre-anesthesia Checklist: Patient identified, Emergency Drugs available, Suction available, Patient being monitored and Timeout performed Patient Re-evaluated:Patient Re-evaluated prior to induction Oxygen Delivery Method: Circle system utilized Preoxygenation: Pre-oxygenation with 100% oxygen Induction Type: IV induction Ventilation: Mask ventilation without difficulty Laryngoscope Size: Mac and 4 Grade View: Grade I Tube type: Oral Tube size: 7.0 mm Number of attempts: 1 Airway Equipment and Method: Stylet Placement Confirmation: ETT inserted through vocal cords under direct vision,  positive ETCO2,  CO2 detector and breath sounds checked- equal and bilateral Tube secured with: Tape Dental Injury: Teeth and Oropharynx as per pre-operative assessment

## 2018-10-15 NOTE — Discharge Instructions (Signed)
No Tylenol until 6:15pm, no ibuprofen until 8:15pm  INSTRUCTIONS FOR AFTER SURGERY   You are having surgery.  You will likely have some questions about what to expect following your operation.  The following information will help you and your family understand what to expect when you are discharged from the hospital.  Following these guidelines will help ensure a smooth recovery and reduce risks of complications.   Postoperative instructions include information on: diet, wound care, medications and physical activity.  AFTER SURGERY Expect to go home after the procedure.  In some cases, you may need to spend one night in the hospital for observation.  DIET This surgery does not require a specific diet.  However, I have to mention that the healthier you eat the better your body can start healing. It is important to increasing your protein intake.  This means limiting the foods with sugar and carbohydrates.  Focus on vegetables and some meat.  If you have any liposuction during your procedure be sure to drink water.  If your urine is bright yellow, then it is concentrated, and you need to drink more water.  As a general rule after surgery, you should have 8 ounces of water every hour while awake.  If you find you are persistently nauseated or unable to take in liquids let us know.  NO TOBACCO USE or EXPOSURE.  This will slow your healing process and increase the risk of a wound.  WOUND CARE If you don't have a drain:You can shower the day after surgery  Use fragrance free soap.  Dial, [place] and [place] are usually mild on the skin.  If you have steri-strips / tape directly attached to your skin leave them in place. It is OK to get these wet.  No baths, pools or hot tubs for two weeks. We close your incision to leave the smallest and best-looking scar. No ointment or creams on your incisions until given the go ahead.  Especially not Neosporin (Too many skin reactions with this one).  A few weeks after  surgery you can use Mederma and start massaging the scar. We ask you to wear your binder or sports bra for the first 6 weeks around the clock, including while sleeping. This provides added comfort and helps reduce the fluid accumulation at the surgery site.  ACTIVITY No heavy lifting until cleared by the doctor.  It is OK to walk and climb stairs. In fact, moving your legs is very important to decrease your risk of a blood clot.  It will also help keep you from getting deconditioned.  Every 1 to 2 hours get up and walk for 5 minutes. This will help with a quicker recovery back to normal.  Let pain be your guide so you don't do too much.  NO, you cannot do the spring cleaning and don't plan on taking care of anyone else.  This is your time for TLC.  You will be more comfortable if you sleep and rest with your head elevated either with a few pillows under you or in a recliner.  No stomach sleeping for a few months.  WORK Everyone returns to work at different times. As a rough guide, most people take at least 1 - 2 weeks off prior to returning to work. If you need documentation for your job, bring the forms to your postoperative follow up visit.  DRIVING Arrange for someone to bring you home from the hospital.  You may be able to drive a  few days after surgery but not while taking any narcotics or valium.  BOWEL MOVEMENTS Constipation can occur after anesthesia and while taking pain medication.  It is important to stay ahead for your comfort.  We recommend taking Milk of Magnesia (2 tablespoons; twice a day) while taking the pain pills.  SEROMA This is fluid your body tried to put in the surgical site.  This is normal but if it creates tight skinny skin let us know.  It usually decreases in a few weeks.  WHEN TO CALL Call your surgeon's office if any of the following occur:  Fever 101 degrees F or greater  Excessive bleeding or fluid from the incision site.  Pain that increases over time  without aid from the medications  Redness, warmth, or pus draining from incision sites  Persistent nausea or inability to take in liquids  Severe misshapen area that underwent the operation.    Post Anesthesia Home Care Instructions  Activity: Get plenty of rest for the remainder of the day. A responsible individual must stay with you for 24 hours following the procedure.  For the next 24 hours, DO NOT: -Drive a car -Paediatric nurse -Drink alcoholic beverages -Take any medication unless instructed by your physician -Make any legal decisions or sign important papers.  Meals: Start with liquid foods such as gelatin or soup. Progress to regular foods as tolerated. Avoid greasy, spicy, heavy foods. If nausea and/or vomiting occur, drink only clear liquids until the nausea and/or vomiting subsides. Call your physician if vomiting continues.  Special Instructions/Symptoms: Your throat may feel dry or sore from the anesthesia or the breathing tube placed in your throat during surgery. If this causes discomfort, gargle with warm salt water. The discomfort should disappear within 24 hours.  If you had a scopolamine patch placed behind your ear for the management of post- operative nausea and/or vomiting:  1. The medication in the patch is effective for 72 hours, after which it should be removed.  Wrap patch in a tissue and discard in the trash. Wash hands thoroughly with soap and water. 2. You may remove the patch earlier than 72 hours if you experience unpleasant side effects which may include dry mouth, dizziness or visual disturbances. 3. Avoid touching the patch. Wash your hands with soap and water after contact with the patch.

## 2018-10-15 NOTE — Anesthesia Preprocedure Evaluation (Addendum)
Anesthesia Evaluation  Patient identified by MRN, date of birth, ID band Patient awake    Reviewed: Allergy & Precautions, NPO status , Patient's Chart, lab work & pertinent test results  Airway Mallampati: II  TM Distance: >3 FB Neck ROM: Full    Dental  (+) Teeth Intact, Dental Advisory Given, Missing,    Pulmonary neg pulmonary ROS,    Pulmonary exam normal breath sounds clear to auscultation       Cardiovascular hypertension, Pt. on medications (-) angina(-) CAD, (-) Past MI and (-) CHF Normal cardiovascular exam Rhythm:Regular Rate:Normal     Neuro/Psych  Headaches,  Neuromuscular disease    GI/Hepatic Neg liver ROS, GERD  Medicated and Controlled,  Endo/Other  Morbid obesity  Renal/GU negative Renal ROS     Musculoskeletal  (+) Arthritis ,   Abdominal   Peds  Hematology negative hematology ROS (+)   Anesthesia Other Findings Day of surgery medications reviewed with the patient.  Malignant Neoplasm Of Upper-outer Quadrant Of Right Breast In Female, Estrogen Receptor Positive; S/P breast reconstruction, bilateral; Acquired absence of right breast  Reproductive/Obstetrics                            Anesthesia Physical Anesthesia Plan  ASA: III  Anesthesia Plan: General   Post-op Pain Management:    Induction: Intravenous  PONV Risk Score and Plan: 4 or greater and Diphenhydramine, Scopolamine patch - Pre-op, Midazolam, Dexamethasone and Ondansetron  Airway Management Planned: Oral ETT  Additional Equipment:   Intra-op Plan:   Post-operative Plan: Extubation in OR  Informed Consent: I have reviewed the patients History and Physical, chart, labs and discussed the procedure including the risks, benefits and alternatives for the proposed anesthesia with the patient or authorized representative who has indicated his/her understanding and acceptance.     Dental advisory  given  Plan Discussed with: CRNA  Anesthesia Plan Comments:         Anesthesia Quick Evaluation

## 2018-10-15 NOTE — Transfer of Care (Signed)
Immediate Anesthesia Transfer of Care Note  Patient: Michelle Mcmillan  Procedure(s) Performed: REMOVAL OF BILATERAL TISSUE EXPANDERS WITH PLACEMENT OF BILATERAL BREAST IMPLANTS (Bilateral Breast) LIPOSUCTION TO BILATERAL BREASTS (Bilateral Breast)  Patient Location: PACU  Anesthesia Type:General  Level of Consciousness: awake, alert , oriented and patient cooperative  Airway & Oxygen Therapy: Patient Spontanous Breathing and Patient connected to nasal cannula oxygen  Post-op Assessment: Report given to RN and Post -op Vital signs reviewed and stable  Post vital signs: Reviewed and stable  Last Vitals:  Vitals Value Taken Time  BP    Temp    Pulse 88 10/15/18 1511  Resp 8 10/15/18 1511  SpO2 100 % 10/15/18 1511  Vitals shown include unvalidated device data.  Last Pain:  Vitals:   10/15/18 1132  TempSrc: (P) Oral         Complications: No apparent anesthesia complications

## 2018-10-15 NOTE — Interval H&P Note (Signed)
History and Physical Interval Note:  10/15/2018 11:51 AM  Michelle Mcmillan  has presented today for surgery, with the diagnosis of Malignant Neoplasm Of Upper-outer Quadrant Of Right Breast In Female, Estrogen Receptor Positive; S/P breast reconstruction, bilateral; Acquired absence of right breast.  The various methods of treatment have been discussed with the patient and family. After consideration of risks, benefits and other options for treatment, the patient has consented to  Procedure(s): REMOVAL OF BILATERAL TISSUE EXPANDERS WITH PLACEMENT OF BILATERAL BREAST IMPLANTS (Bilateral) LIPOSUCTION TO BILATERAL BREASTS (Bilateral) as a surgical intervention.  The patient's history has been reviewed, patient examined, no change in status, stable for surgery.  I have reviewed the patient's chart and labs.  Questions were answered to the patient's satisfaction.     Michelle Mcmillan

## 2018-10-15 NOTE — Op Note (Signed)
Op report Bilateral Exchange   DATE OF OPERATION: 10/15/2018  LOCATION: [place]  SURGICAL DIVISION: Plastic Surgery  PREOPERATIVE DIAGNOSES:  1.History of breast cancer.  2. Acquired absence of bilateral breast.   POSTOPERATIVE DIAGNOSES:  1. History of breast cancer.  2. Acquired absence of bilateral breast.   PROCEDURE:  1. Bilateral exchange of tissue expanders for implants.  2. Bilateral capsulotomies for implant respositioning.  SURGEON: Claire Sanger Dillingham, DO  ASSISTANT: [place], RNFA, Roetta Sessions, [place]  ANESTHESIA:  General.   COMPLICATIONS: None.   IMPLANTS: Left - Mentor Smooth Round High Profile Gel 800 cc. Ref #[id number].  Serial Number [id number] Right - Mentor Smooth Round High Profile Gel 590cc. Ref #[id number].  Serial Number [id number]  INDICATIONS FOR PROCEDURE:  The patient, Michelle Mcmillan, is a 48 y.o. female born on July 17, 1970, is here for treatment after bilateral mastectomies.  She had tissue expanders placed at the time of mastectomies. She now presents for exchange of her expanders for implants.  She requires capsulotomies to better position the implants. She has a latissimus flap on one side since it was radiated.  Goal is for symmetry. MRN: [id number]  CONSENT:  Informed consent was obtained directly from the patient. Risks, benefits and alternatives were fully discussed. Specific risks including but not limited to bleeding, infection, hematoma, seroma, scarring, pain, implant infection, implant extrusion, capsular contracture, asymmetry, wound healing problems, and need for further surgery were all discussed. The patient did have an ample opportunity to have her questions answered to her satisfaction.   DESCRIPTION OF PROCEDURE:  The patient was taken to the operating room. SCDs were placed and IV antibiotics were given. The patient's chest was prepped and draped in a sterile fashion. A time out was performed and  the implants to be used were identified.    On the right breast: One percent Lidocaine with epinephrine was used to infiltrate at the incision site. The old mastectomy scar was incised at the inferior portion of the latissimus flap.  The mastectomy flap at the inferior flaps was raised over the latissimus flap to the inferior portion of the flap near the inframammary fold. The muscle was split to expose and remove the tissue expander.  Inspection of the pocket showed a normal healthy capsule and good integration of the biologic matrix.  The pocket was irrigated with antibiotic solution.   Circumferential capsulotomies were performed to allow for breast pocket expansion.  Measurements were made and a sizer used to confirm adequate pocket size for the implant dimensions.  Hemostasis was ensured with electrocautery. New gloves were placed. The implant was soaked in antibiotic solution and then placed in the pocket and oriented appropriately. Several sizers were tried and we finally settled on the 590 cc for closure and symmetry.The latissimus muscle and capsule on the anterior surface were closed with a 3-0 Monocryl suture. The remaining skin was closed with 4-0 Monocryl deep dermal and 5-0 Monocryl subcuticular stitches.   On the left breast: The old mastectomy scar was incised.  The mastectomy flaps from the superior and inferior flaps were raised over the pectoralis major muscle for several centimeters to minimize tension for the closure. The pectoralis was split inferior to the skin incision to expose and remove the tissue expander.  Inspection of the pocket showed a normal healthy capsule and good integration of the biologic matrix.   Circumferential capsulotomies were performed to allow for breast pocket expansion.  Measurements were made and a sizer utilized  to confirm adequate pocket size for the implant dimensions.  Hemostasis was ensured with the electrocautery.  New gloves were applied. The implant  was soaked in antibiotic solution and placed in the pocket and oriented appropriately. The pectoralis major muscle and capsule on the anterior surface were re-closed with a 3-0 Monocryl suture. The remaining skin was closed with 4-0 Monocryl deep dermal and 5-0 Monocryl subcuticular stitches.  Dermabond was applied to the incision site. A breast binder and ABDs were placed.  The patient was allowed to wake from anesthesia and taken to the recovery room in satisfactory condition.   The RNFA assisted throughout the case.  The RNFA was essential in retraction and counter traction when needed to make the case progress smoothly.  This retraction and assistance made it possible to see the tissue plans for the procedure.  The assistance was needed for blood control, tissue re-approximation and assisted with closure of the incision site.

## 2018-10-15 NOTE — Anesthesia Postprocedure Evaluation (Signed)
Anesthesia Post Note  Patient: Michelle Mcmillan  Procedure(s) Performed: REMOVAL OF BILATERAL TISSUE EXPANDERS WITH PLACEMENT OF BILATERAL BREAST IMPLANTS (Bilateral Breast) LIPOSUCTION TO BILATERAL BREASTS (Bilateral Breast)     Patient location during evaluation: PACU Anesthesia Type: General Level of consciousness: awake and alert Pain management: pain level controlled Vital Signs Assessment: post-procedure vital signs reviewed and stable Respiratory status: spontaneous breathing, nonlabored ventilation and respiratory function stable Cardiovascular status: blood pressure returned to baseline and stable Postop Assessment: no apparent nausea or vomiting Anesthetic complications: no    Last Vitals:  Vitals:   10/15/18 1545 10/15/18 1600  BP: 115/66 119/67  Pulse: 60 (!) 59  Resp: 14 12  Temp:    SpO2: 100% 98%    Last Pain:  Vitals:   10/15/18 1600  TempSrc:   PainSc: 2                  Michelle Mcmillan,W. EDMOND

## 2018-10-16 ENCOUNTER — Encounter (HOSPITAL_BASED_OUTPATIENT_CLINIC_OR_DEPARTMENT_OTHER): Payer: Self-pay | Admitting: Plastic Surgery

## 2018-10-22 ENCOUNTER — Telehealth: Payer: Self-pay | Admitting: Plastic Surgery

## 2018-10-22 NOTE — Telephone Encounter (Signed)

## 2018-10-23 ENCOUNTER — Ambulatory Visit (INDEPENDENT_AMBULATORY_CARE_PROVIDER_SITE_OTHER): Payer: Medicaid Other | Admitting: Plastic Surgery

## 2018-10-23 ENCOUNTER — Other Ambulatory Visit: Payer: Self-pay

## 2018-10-23 ENCOUNTER — Encounter: Payer: Self-pay | Admitting: Plastic Surgery

## 2018-10-23 VITALS — BP 111/79 | HR 69 | Temp 97.3°F | Ht 69.0 in | Wt 302.8 lb

## 2018-10-23 DIAGNOSIS — Z9013 Acquired absence of bilateral breasts and nipples: Secondary | ICD-10-CM

## 2018-10-23 DIAGNOSIS — N6489 Other specified disorders of breast: Secondary | ICD-10-CM

## 2018-10-23 DIAGNOSIS — Z9889 Other specified postprocedural states: Secondary | ICD-10-CM

## 2018-10-23 MED ORDER — FLUCONAZOLE 150 MG PO TABS: 150.0000 mg | ORAL_TABLET | Freq: Once | ORAL | 0 refills | Status: AC

## 2018-10-23 NOTE — Progress Notes (Signed)
   Subjective:    Patient ID: Michelle Mcmillan, female    DOB: 1970-09-15, 48 y.o.   MRN: [id number]  The patient is a 48 year old female here for follow-up after undergoing bilateral breast reconstruction with expander removal and implant placement.  She is with her husband again.  Everything is healing very nicely.  All of the incisions are closed.  There is no sign of seroma or hematoma.  She has some laxity on the left lateral aspect.  This is most likely related to her weight loss.  She is pleased with the results.    Review of Systems  Constitutional: Negative.   HENT: Negative.   Respiratory: Negative.   Cardiovascular: Negative.   Musculoskeletal: Negative.   Psychiatric/Behavioral: Negative.        Objective:   Physical Exam Vitals signs and nursing note reviewed.  Constitutional:      Appearance: Normal appearance.  HENT:     Head: Normocephalic and atraumatic.  Cardiovascular:     Rate and Rhythm: Normal rate.     Pulses: Normal pulses.  Pulmonary:     Effort: Pulmonary effort is normal.  Neurological:     General: No focal deficit present.     Mental Status: She is alert. Mental status is at baseline.  Psychiatric:        Mood and Affect: Mood normal.        Behavior: Behavior normal.         Assessment & Plan:     ICD-10-CM   1. Postoperative breast asymmetry  N64.89   2. S/P breast reconstruction, bilateral  Z98.890   3. S/P mastectomy, bilateral  Z90.13      Continue with massage of the implants and a sports bra.  Follow up in 1 month.  She mentioned having a yeast infection due to the antibiotic.  I will send some Diflucan in for her.

## 2018-11-13 NOTE — Progress Notes (Deleted)
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   INTERVAL HISTORY: Patient returns to clinic today for routine evaluation.  She continues to have hot flashes with letrozole, but states these are improved.  Over the past several months she has had intermittent diarrhea associated with abdominal pain.  She also has had occasional fevers and night sweats.  She otherwise feels well.  She continues to have a mild peripheral neuropathy.  She has no other neurologic complaints. She has a good appetite and denies weight loss. She has no chest pain or shortness of breath.  She denies any nausea, vomiting, or constipation.  She has no urinary complaints.  Patient offers no further specific complaints today.  REVIEW OF SYSTEMS:   Review of Systems  Constitutional: Positive for diaphoresis. Negative for fever, malaise/fatigue and weight loss.  Respiratory: Negative.  Negative for cough and shortness of breath.   Cardiovascular: Negative.  Negative for chest pain and leg swelling.  Gastrointestinal: Positive for abdominal pain and diarrhea. Negative for constipation, nausea and vomiting.  Genitourinary: Negative.  Negative for dysuria.  Musculoskeletal: Negative.  Negative for joint pain and myalgias.  Skin: Negative.  Negative for rash.  Neurological: Positive for tingling and sensory change. Negative for focal weakness and weakness.  Psychiatric/Behavioral: The  patient is not nervous/anxious and does not have insomnia.     As per HPI. Otherwise, a complete review of systems is negative.  PAST MEDICAL HISTORY: Past Medical History:  Diagnosis Date   Anemia    H/O   Anemia    Arthritis    RIGHT KNEE   Breast cancer ([place]) 2017   right breast   Cancer ([place])    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)   Dyspnea    GERD (gastroesophageal reflux disease)    NO MEDS   Headache    migraines   Hypertension    Last menstrual period (LMP) > 10 days ago 08/2015   Lower extremity edema    Neuropathy    Personal history of radiation therapy     PAST SURGICAL HISTORY: Past Surgical History:  Procedure Laterality Date   BREAST BIOPSY Right 05/08/2017   invasive mamm. ca   BREAST EXCISIONAL BIOPSY Right 06/15/15   breast cancer   BREAST LUMPECTOMY WITH NEEDLE LOCALIZATION Right 06/15/2015   Procedure: BREAST LUMPECTOMY WITH NEEDLE LOCALIZATION;  Surgeon: Hubbard Robinson, MD;  Location: [facility];  Service: General;  Laterality: Right;   BREAST RECONSTRUCTION WITH PLACEMENT OF TISSUE EXPANDER AND FLEX HD (ACELLULAR HYDRATED DERMIS) Left 02/09/2018   Procedure: BREAST RECONSTRUCTION WITH PLACEMENT OF TISSUE EXPANDER AND FLEX HD (ACELLULAR HYDRATED DERMIS);  Surgeon: Wallace Going, DO;  Location: [facility];  Service: Plastics;  Laterality: Left;   BREAST SURGERY     CESAREAN SECTION  1988   x1   COLONOSCOPY WITH PROPOFOL N/A 01/23/2017   Procedure: COLONOSCOPY WITH PROPOFOL;  Surgeon: Lin Landsman, MD;  Location: [facility] ENDOSCOPY;  Service: Gastroenterology;  Laterality: N/A;   ESOPHAGOGASTRODUODENOSCOPY (EGD) WITH PROPOFOL N/A 01/23/2017  Procedure: ESOPHAGOGASTRODUODENOSCOPY (EGD) WITH PROPOFOL;  Surgeon: Lin Landsman, MD;  [facility];  Service: Gastroenterology;  Laterality: N/A;   LATISSIMUS FLAP TO BREAST Right 06/01/2018   Procedure: RIGHT BREAST RECONSTRUCTION WITH LATISSIMUS MYOCUTANEOUS FLAP;   Surgeon: Wallace Going, DO;  Location: [place];  Service: Plastics;  Laterality: Right;   LIPOSUCTION WITH LIPOFILLING Bilateral 10/15/2018   Procedure: LIPOSUCTION TO BILATERAL BREASTS;  Surgeon: Wallace Going, DO;  Location: [place];  Service: Plastics;  Laterality: Bilateral;   MASS EXCISION N/A 05/20/2016   Procedure: EXCISION CHEST WALL MASS;  Surgeon: Florene Glen, MD;  Location: [facility];  Service: General;  Laterality: N/A;   RECONSTRUCTION BREAST W/ LATISSIMUS DORSI FLAP Right 06/01/2018   REMOVAL OF BILATERAL TISSUE EXPANDERS WITH PLACEMENT OF BILATERAL BREAST IMPLANTS Bilateral 10/15/2018   Procedure: REMOVAL OF BILATERAL TISSUE EXPANDERS WITH PLACEMENT OF BILATERAL BREAST IMPLANTS;  Surgeon: Wallace Going, DO;  Location: [place];  Service: Plastics;  Laterality: Bilateral;   SENTINEL NODE BIOPSY Right 06/15/2015   Procedure: SENTINEL NODE BIOPSY;  Surgeon: Hubbard Robinson, MD;  Location: [facility];  Service: General;  Laterality: Right;   TOTAL MASTECTOMY Right 05/22/2017   Procedure: TOTAL MASTECTOMY;  Surgeon: Florene Glen, MD;  Location: [facility];  Service: General;  Laterality: Right;   TOTAL MASTECTOMY Left 02/09/2018   Procedure: LEFT PROPHYLACTIC  MASTECTOMY;  Surgeon: Jovita Kussmaul, MD;  Location: [facility];  Service: General;  Laterality: Left;   TUBAL LIGATION  2004    FAMILY HISTORY Family History  Problem Relation Age of Onset   Hypertension Mother    Hypertension Father    Hypertension Sister    Breast cancer Neg Hx        ADVANCED DIRECTIVES:    HEALTH MAINTENANCE: Social History   Tobacco Use   Smoking status: Never Smoker   Smokeless tobacco: Never Used  Substance Use Topics   Alcohol use: Not Currently    Comment: WINE ONCE MONTH   Drug use: No    Allergies  Allergen Reactions   Nitroglycerin Other (See Comments)    Headache, n/v.    Current Outpatient Medications   Medication Sig Dispense Refill   diazepam (VALIUM) 2 MG tablet Take 1 tablet (2 mg total) by mouth every 12 (twelve) hours as needed for anxiety or muscle spasms. 30 tablet 0   diclofenac sodium (VOLTAREN) 1 % GEL Apply 4 g topically 4 (four) times daily.     escitalopram (LEXAPRO) 10 MG tablet Take 10 mg by mouth daily.     Ferrous Sulfate (IRON) 325 (65 Fe) MG TABS Take 1 tablet (325 mg total) by mouth 2 (two) times daily. 60 each 2   gabapentin (NEURONTIN) 300 MG capsule Take 1 capsule (300 mg total) by mouth 3 (three) times daily. May take 2 caps (600 mg) at bedtime if more bothersome at night 90 capsule 1   hydrochlorothiazide (HYDRODIURIL) 25 MG tablet Take 25 mg by mouth daily.     HYDROcodone-acetaminophen (NORCO) 5-325 MG tablet Take 1 tablet by mouth every 6 (six) hours as needed for moderate pain. 30 tablet 0   letrozole (FEMARA) 2.5 MG tablet Take 1 tablet (2.5 mg total) by mouth daily. 30 tablet 3   omeprazole (PRILOSEC) 20 MG capsule Take 1 capsule (20 mg total) by mouth daily. 30 capsule 3   potassium chloride SA (K-DUR,KLOR-CON) 20 MEQ tablet Take 1 tablet (20 mEq total) by mouth 2 (  two) times daily. 30 tablet 1   tetrahydrozoline 0.05 % ophthalmic solution Place 2 drops into both eyes daily.     traZODone (DESYREL) 50 MG tablet Take 50 mg by mouth at bedtime.     No current facility-administered medications for this visit.     OBJECTIVE: There were no vitals filed for this visit.   There is no height or weight on file to calculate BMI.    ECOG FS:0 - Asymptomatic  General: Well-developed, well-nourished, no acute distress. Eyes: Pink conjunctiva, anicteric sclera. HEENT: Normocephalic, moist mucous membranes. Breast: Patient has bilateral mastectomy.  Exam deferred today. Lungs: Clear to auscultation bilaterally. Heart: Regular rate and rhythm. No rubs, murmurs, or gallops. Abdomen: Soft, nontender, nondistended. No organomegaly noted, normoactive bowel  sounds. Musculoskeletal: No edema, cyanosis, or clubbing. Neuro: Alert, answering all questions appropriately. Cranial nerves grossly intact. Skin: No rashes or petechiae noted. Psych: Normal affect.  LAB RESULTS:  Lab Results  Component Value Date   NA 141 10/09/2018   K 3.9 10/09/2018   CL 102 10/09/2018   CO2 30 10/09/2018   GLUCOSE 91 10/09/2018   BUN 19 10/09/2018   CREATININE 0.96 10/09/2018   CALCIUM 9.3 10/09/2018   PROT 6.9 10/21/2017   ALBUMIN 3.4 (L) 10/21/2017   AST 37 10/21/2017   ALT 48 (H) 10/21/2017   ALKPHOS 79 10/21/2017   BILITOT 0.4 10/21/2017   GFRNONAA >60 10/09/2018   GFRAA >60 10/09/2018    Lab Results  Component Value Date   WBC 7.9 06/02/2018   NEUTROABS 3.7 10/21/2017   HGB 9.5 (L) 06/02/2018   HCT 31.6 (L) 06/02/2018   MCV 77.6 (L) 06/02/2018   PLT 216 06/02/2018     STUDIES: No results found.  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 BRCA 1 and 2 negative, Oncotype DX score 17 which is considered low risk.  PLAN:    1. 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  This occurred while patient was taking Aromasin.  Oncotype was not sent on her second breast cancer.  CT scans and bone scans reviewed independently with no evidence of metastatic disease confirming stage I malignancy.  Patient completed cycle 5 of Taxotere and Cytoxan on September 16, 2017.  Patient received 1 additional cycle secondary to a reaction she had during cycle 1 and did not complete treatment at that time.  Despite hot flashes, patient wishes to continue letrozole at this time.  Baseline bone mineral density on November 12, 2017 was reported as normal.  Repeat in August 2021.   Return to clinic in 6 months for routine evaluation. 2. Genetic testing: Patient was found to be BRCA 1 and 2 negative. She expressed understanding that although her genetic testing was negative, her children are at higher risk for breast cancer than the general population.   3.  Hot flashes: Likely secondary to letrozole.  We once again discussed discontinuing treatment and switching to anastrozole or possibly adding Effexor.  Patient declined both these options and wishes to continue letrozole as prescribed. 4.  Neuropathy: Chronic and unchanged.  Continue gabapentin as prescribed. 5.  Breast reconstruction: Patient has had bilateral mastectomy.  Continue reconstruction with plastic surgery as scheduled. 6.  Diarrhea/abdominal pain: Unclear  etiology.  Patient had colonoscopy and EGD in October 2018.  I recommended she recontact her primary GI physician for further evaluation.  I spent a total of 30 minutes face-to-face with the patient of which greater than 50% of the visit was spent in counseling and coordination of care as detailed above.  Patient expressed understanding and was in agreement with this plan. She also understands that She can call clinic at any time with any questions, concerns, or complaints.    Lloyd Huger, MD 11/13/18 4:08 PM

## 2018-11-16 ENCOUNTER — Ambulatory Visit
Admission: RE | Admit: 2018-11-16 | Discharge: 2018-11-16 | Disposition: A | Discharge status: Home / Self Care | Payer: Medicaid Other | Source: Ambulatory Visit | Attending: Oncology | Admitting: Oncology

## 2018-11-16 ENCOUNTER — Other Ambulatory Visit: Payer: Self-pay

## 2018-11-16 DIAGNOSIS — C50411 Malignant neoplasm of upper-outer quadrant of right female breast: Secondary | ICD-10-CM | POA: Diagnosis not present

## 2018-11-16 DIAGNOSIS — Z170 Estrogen receptor positive status [ER+]: Secondary | ICD-10-CM | POA: Insufficient documentation

## 2018-11-19 ENCOUNTER — Encounter: Payer: Self-pay | Admitting: Oncology

## 2018-11-19 ENCOUNTER — Inpatient Hospital Stay: Payer: Medicaid Other | Admitting: Oncology

## 2018-12-03 ENCOUNTER — Other Ambulatory Visit: Payer: Self-pay | Admitting: Orthopedic Surgery

## 2018-12-03 DIAGNOSIS — G5622 Lesion of ulnar nerve, left upper limb: Secondary | ICD-10-CM

## 2018-12-17 ENCOUNTER — Other Ambulatory Visit: Payer: Self-pay | Admitting: Orthopedic Surgery

## 2018-12-17 ENCOUNTER — Telehealth: Payer: Self-pay | Admitting: *Deleted

## 2018-12-17 NOTE — Telephone Encounter (Signed)
I have not seen her since feb.  Please put her on my schedule in the week.  Thanks.

## 2018-12-17 NOTE — Telephone Encounter (Signed)
Patient called reporting that she has a lump on her chest and is asking if she needs to be seen or what needs to be done. Please advise

## 2018-12-17 NOTE — Progress Notes (Signed)
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   INTERVAL HISTORY: Patient returns to clinic today as add-on after having noticed a new increased subcutaneous swelling midline near her sternum.  It is nontender.  She otherwise feels well.  She does not complain of peripheral neuropathy today and has no other neurologic complaints.  She denies any pain.  She has a good appetite and denies weight loss.  She has no chest pain, shortness of breath, cough, or hemoptysis.  She denies any nausea, vomiting, constipation, or diarrhea.  She has no urinary complaints.  Patient offers no further specific complaints today.  REVIEW OF SYSTEMS:   Review of Systems  Constitutional: Negative.  Negative for diaphoresis, fever, malaise/fatigue and weight loss.  Respiratory: Negative.  Negative for cough and shortness of breath.   Cardiovascular: Negative.  Negative for chest pain and leg swelling.  Gastrointestinal: Negative.  Negative for abdominal pain, constipation, diarrhea, nausea and vomiting.  Genitourinary: Negative.  Negative for dysuria.  Musculoskeletal: Negative.  Negative for joint pain and myalgias.  Skin: Negative.  Negative for rash.  Neurological: Negative.  Negative for tingling, sensory change, focal weakness and weakness.  Psychiatric/Behavioral: Negative.  The patient is not nervous/anxious and does not have insomnia.     As per HPI. Otherwise, a  complete review of systems is negative.  PAST MEDICAL HISTORY: Past Medical History:  Diagnosis Date   Anemia    H/O   Anemia    Arthritis    RIGHT KNEE   Breast cancer ([place]) 2017   right breast   Cancer ([place])    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)   Dyspnea    GERD (gastroesophageal reflux disease)    NO MEDS   Headache    migraines   Hypertension    Last menstrual period (LMP) > 10 days ago 08/2015   Lower extremity edema    Neuropathy    Personal history of radiation therapy     PAST SURGICAL HISTORY: Past Surgical History:  Procedure Laterality Date   BREAST BIOPSY Right 05/08/2017   invasive mamm. ca   BREAST EXCISIONAL BIOPSY Right 06/15/15   breast cancer   BREAST LUMPECTOMY WITH NEEDLE LOCALIZATION Right 06/15/2015   Procedure: BREAST LUMPECTOMY WITH NEEDLE LOCALIZATION;  Surgeon: Hubbard Robinson, MD;  Location: [facility]S;  Service: General;  Laterality: Right;   BREAST RECONSTRUCTION WITH PLACEMENT OF TISSUE EXPANDER AND FLEX HD (ACELLULAR HYDRATED DERMIS) Left 02/09/2018   Procedure: BREAST RECONSTRUCTION WITH PLACEMENT OF TISSUE EXPANDER AND FLEX HD (ACELLULAR HYDRATED DERMIS);  Surgeon: Wallace Going, DO;  Location: [facility];  Service: Plastics;  Laterality: Left;   BREAST SURGERY     CESAREAN SECTION  1988   x1   COLONOSCOPY WITH PROPOFOL N/A 01/23/2017   Procedure: COLONOSCOPY WITH PROPOFOL;  Surgeon: Lin Landsman, MD;  Location: [facility] ENDOSCOPY;  Service: Gastroenterology;  Laterality: N/A;   ESOPHAGOGASTRODUODENOSCOPY (EGD) WITH PROPOFOL N/A 01/23/2017   Procedure: ESOPHAGOGASTRODUODENOSCOPY (EGD) WITH PROPOFOL;  Surgeon: Lin Landsman, MD;  Location: [facility]  ENDOSCOPY;  Service: Gastroenterology;  Laterality: N/A;   LATISSIMUS FLAP TO BREAST Right 06/01/2018   Procedure: RIGHT BREAST RECONSTRUCTION WITH LATISSIMUS MYOCUTANEOUS FLAP;  Surgeon: Wallace Going, DO;  Location: [place];  Service: Plastics;  Laterality: Right;    LIPOSUCTION WITH LIPOFILLING Bilateral 10/15/2018   Procedure: LIPOSUCTION TO BILATERAL BREASTS;  Surgeon: Wallace Going, DO;  Location: [place];  Service: Plastics;  Laterality: Bilateral;   MASS EXCISION N/A 05/20/2016   Procedure: EXCISION CHEST WALL MASS;  Surgeon: Florene Glen, MD;  Location: [facility];  Service: General;  Laterality: N/A;   RECONSTRUCTION BREAST W/ LATISSIMUS DORSI FLAP Right 06/01/2018   REMOVAL OF BILATERAL TISSUE EXPANDERS WITH PLACEMENT OF BILATERAL BREAST IMPLANTS Bilateral 10/15/2018   Procedure: REMOVAL OF BILATERAL TISSUE EXPANDERS WITH PLACEMENT OF BILATERAL BREAST IMPLANTS;  Surgeon: Wallace Going, DO;  Location: [place];  Service: Plastics;  Laterality: Bilateral;   SENTINEL NODE BIOPSY Right 06/15/2015   Procedure: SENTINEL NODE BIOPSY;  Surgeon: Hubbard Robinson, MD;  Location: [facility];  Service: General;  Laterality: Right;   TOTAL MASTECTOMY Right 05/22/2017   Procedure: TOTAL MASTECTOMY;  Surgeon: Florene Glen, MD;  Location: [facility];  Service: General;  Laterality: Right;   TOTAL MASTECTOMY Left 02/09/2018   Procedure: LEFT PROPHYLACTIC  MASTECTOMY;  Surgeon: Jovita Kussmaul, MD;  Location: [facility];  Service: General;  Laterality: Left;   TUBAL LIGATION  2004    FAMILY HISTORY Family History  Problem Relation Age of Onset   Hypertension Mother    Hypertension Father    Hypertension Sister    Breast cancer Neg Hx        ADVANCED DIRECTIVES:    HEALTH MAINTENANCE: Social History   Tobacco Use   Smoking status: Never Smoker   Smokeless tobacco: Never Used  Substance Use Topics   Alcohol use: Not Currently    Comment: WINE ONCE MONTH   Drug use: No    Allergies  Allergen Reactions   Nitroglycerin Other (See Comments)    Headache, n/v.    Current Outpatient Medications  Medication Sig Dispense Refill   diazepam (VALIUM) 2 MG tablet Take 1 tablet (2 mg total) by  mouth every 12 (twelve) hours as needed for anxiety or muscle spasms. 30 tablet 0   diclofenac sodium (VOLTAREN) 1 % GEL Apply 4 g topically 4 (four) times daily.     escitalopram (LEXAPRO) 10 MG tablet Take 10 mg by mouth daily.     Ferrous Sulfate (IRON) 325 (65 Fe) MG TABS Take 1 tablet (325 mg total) by mouth 2 (two) times daily. 60 each 2   gabapentin (NEURONTIN) 300 MG capsule Take 1 capsule (300 mg total) by mouth 3 (three) times daily. May take 2 caps (600 mg) at bedtime if more bothersome at night 90 capsule 1   hydrochlorothiazide (HYDRODIURIL) 25 MG tablet Take 25 mg by mouth daily.     HYDROcodone-acetaminophen (NORCO) 5-325 MG tablet Take 1 tablet by mouth every 6 (six) hours as needed for moderate pain. 30 tablet 0   letrozole (FEMARA) 2.5 MG tablet Take 1 tablet (2.5 mg total) by mouth daily. 30 tablet 3   omeprazole (PRILOSEC) 20 MG capsule Take 1 capsule (20 mg total) by mouth daily. 30 capsule 3   potassium chloride SA (K-DUR,KLOR-CON) 20 MEQ tablet Take 1 tablet (20 mEq total) by mouth 2 (two) times daily. 30 tablet 1   tetrahydrozoline 0.05 % ophthalmic solution Place  2 drops into both eyes daily.     traZODone (DESYREL) 50 MG tablet Take 50 mg by mouth at bedtime.     No current facility-administered medications for this visit.     OBJECTIVE: Vitals:   12/21/18 1557  BP: 125/78  Pulse: (!) 59  Temp: (!) 97.3 F (36.3 C)     Body mass index is 44.45 kg/m.    ECOG FS:0 - Asymptomatic  General: Well-developed, well-nourished, no acute distress. Eyes: Pink conjunctiva, anicteric sclera. HEENT: Normocephalic, moist mucous membranes. Breast: Bilateral mastectomy with reconstruction. Chest wall: 3 cm diameter nodule palpated midline above sternum. Lungs: Clear to auscultation bilaterally. Heart: Regular rate and rhythm. No rubs, murmurs, or gallops. Abdomen: Soft, nontender, nondistended. No organomegaly noted, normoactive bowel sounds. Musculoskeletal: No  edema, cyanosis, or clubbing. Neuro: Alert, answering all questions appropriately. Cranial nerves grossly intact. Skin: No rashes or petechiae noted. Psych: Normal affect.  LAB RESULTS:  Lab Results  Component Value Date   NA 141 10/09/2018   K 3.9 10/09/2018   CL 102 10/09/2018   CO2 30 10/09/2018   GLUCOSE 91 10/09/2018   BUN 19 10/09/2018   CREATININE 0.96 10/09/2018   CALCIUM 9.3 10/09/2018   PROT 6.9 10/21/2017   ALBUMIN 3.4 (L) 10/21/2017   AST 37 10/21/2017   ALT 48 (H) 10/21/2017   ALKPHOS 79 10/21/2017   BILITOT 0.4 10/21/2017   GFRNONAA >60 10/09/2018   GFRAA >60 10/09/2018    Lab Results  Component Value Date   WBC 7.9 06/02/2018   NEUTROABS 3.7 10/21/2017   HGB 9.5 (L) 06/02/2018   HCT 31.6 (L) 06/02/2018   MCV 77.6 (L) 06/02/2018   PLT 216 06/02/2018     STUDIES: [place] Complete Joint Space Structure Up Left  Result Date: 12/18/2018 CLINICAL DATA:  Left ulnar neuropathy. EXAM: ULTRASOUND left UPPER EXTREMITY COMPLETE TECHNIQUE: Ultrasound examination was performed including evaluation of the muscles, tendons, joint, and adjacent soft tissues. COMPARISON:  None. FINDINGS: I performed ultrasound over the ulnar nerve at the wrist and into the hand. The ulnar nerve is identified and appears normal as it passes through Guyon's canal. There is no ganglion cyst or mass. The nerve is not abnormally enlarged. IMPRESSION: No abnormality of the left ulnar nerve or adjacent soft tissues at the wrist and hand. Electronically Signed   By: Lorriane Shire M.D.   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 BRCA 1 and 2 negative, Oncotype DX score 17 which is considered low risk.  PLAN:    1. 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  This occurred while patient was taking Aromasin.  Oncotype was not sent on her second breast cancer.  CT scans and bone scans reviewed independently with no evidence of metastatic disease confirming stage I malignancy.  Patient completed cycle 5 of Taxotere and Cytoxan on September 16, 2017.  Patient received 1 additional cycle secondary to a reaction she had during cycle 1 and did not complete treatment at that time.  Continue letrozole for minimum 5 years completing in June 2024.  Return to clinic in 6 months for routine evaluation.   2. Genetic testing: Patient was found to be BRCA 1  and 2 negative. She expressed understanding that although her genetic testing was negative, her children are at higher risk for breast cancer than the general population.   3.  Neuropathy: Patient does not complain of this today.  Continue gabapentin as prescribed. 4.  Breast reconstruction: Patient has had bilateral mastectomy.  Patient states she has follow-up with surgery on Friday. 5.  Subcutaneous nodule: Midline just above sternum, nontender.  Although appears to be a subcutaneous lipoma, given patient's history of breast cancer x2, will get a CT scan of the chest to further evaluate.  Plan is to schedule CT prior to Friday when patient has follow-up with her surgery so that if it is concerning, a biopsy can be performed.  I spent a total of 30 minutes face-to-face with the patient of which greater than 50% of the visit was spent in counseling and coordination of care as detailed above.  Patient expressed understanding and was in agreement with this plan. She also understands that She can call clinic at any time with any questions, concerns, or complaints.    Lloyd Huger, MD 12/22/18 6:47 AM

## 2018-12-17 NOTE — Telephone Encounter (Signed)
Called pt and scheduled an appt.

## 2018-12-18 ENCOUNTER — Ambulatory Visit
Admission: RE | Admit: 2018-12-18 | Discharge: 2018-12-18 | Disposition: A | Discharge status: Home / Self Care | Payer: Medicaid Other | Source: Ambulatory Visit | Attending: Orthopedic Surgery | Admitting: Orthopedic Surgery

## 2018-12-18 DIAGNOSIS — G5622 Lesion of ulnar nerve, left upper limb: Secondary | ICD-10-CM

## 2018-12-21 ENCOUNTER — Other Ambulatory Visit: Payer: Self-pay

## 2018-12-21 ENCOUNTER — Encounter: Payer: Self-pay | Admitting: Oncology

## 2018-12-21 ENCOUNTER — Inpatient Hospital Stay: Payer: Medicaid Other | Attending: Oncology | Admitting: Oncology

## 2018-12-21 VITALS — BP 125/78 | HR 59 | Temp 97.3°F | Wt 301.0 lb

## 2018-12-21 DIAGNOSIS — Z923 Personal history of irradiation: Secondary | ICD-10-CM | POA: Diagnosis not present

## 2018-12-21 DIAGNOSIS — Z9013 Acquired absence of bilateral breasts and nipples: Secondary | ICD-10-CM | POA: Insufficient documentation

## 2018-12-21 DIAGNOSIS — Z170 Estrogen receptor positive status [ER+]: Secondary | ICD-10-CM | POA: Diagnosis not present

## 2018-12-21 DIAGNOSIS — C50411 Malignant neoplasm of upper-outer quadrant of right female breast: Secondary | ICD-10-CM

## 2018-12-21 DIAGNOSIS — Z79811 Long term (current) use of aromatase inhibitors: Secondary | ICD-10-CM | POA: Insufficient documentation

## 2018-12-21 DIAGNOSIS — R222 Localized swelling, mass and lump, trunk: Secondary | ICD-10-CM | POA: Diagnosis not present

## 2018-12-21 DIAGNOSIS — G629 Polyneuropathy, unspecified: Secondary | ICD-10-CM | POA: Diagnosis not present

## 2018-12-21 DIAGNOSIS — R229 Localized swelling, mass and lump, unspecified: Secondary | ICD-10-CM | POA: Diagnosis not present

## 2018-12-21 DIAGNOSIS — Z9221 Personal history of antineoplastic chemotherapy: Secondary | ICD-10-CM | POA: Diagnosis not present

## 2018-12-22 ENCOUNTER — Ambulatory Visit: Payer: Medicaid Other

## 2018-12-24 ENCOUNTER — Ambulatory Visit: Payer: Medicaid Other | Admitting: Oncology

## 2018-12-24 ENCOUNTER — Telehealth: Payer: Self-pay

## 2018-12-24 NOTE — Telephone Encounter (Signed)

## 2018-12-24 NOTE — Progress Notes (Signed)
Subjective:     Patient ID: Michelle Mcmillan, female    DOB: April 23, 1970, 48 y.o.   MRN: [id number]  Chief Complaint  Patient presents with  . Follow-up    2 mos for (B) removal of breast expanders and placement of implants    HPI: The patient is a 48 y.o. female here for follow-up on bilateral breast reconstruction.  On 10/15/2018 she underwent bilateral change of tissue expanders for implants along with bilateral capsulectomies for implant repositioning.  She is here with her husband.  She recently saw her oncologist (2 days ago) who ordered a CT scan to evaluate subcutaneous nodule/mass at the midline just above her sternum. Patient unsure when CT will be performed.  She reports she noticed this raised area shortly after surgery.   She is doing really well, incisions have healed very nicely and are all c/d/i.  Michelle Mcmillan had some questions about future surgical intervention for breast asymmetry.  She is also interested in abdominal surgery for removal of excessive skin after weight loss.  She reports that she has seen Dr. Fredna Dow in regards bilateral hand pain, right sided carpal tunnel and left-sided all pinched ulnar nerve.  She is going to plan for surgical intervention for the pinched ulnar nerve followed by carpal tunnel release on the right in the future.  No fevers, chills, nausea, vomiting.  No chest pain.  Review of Systems  Constitutional: Negative for chills, diaphoresis, fever and weight loss.  HENT: Negative.   Respiratory: Negative.   Cardiovascular: Negative.   Genitourinary: Negative.   Musculoskeletal: Negative.   Skin: Negative for itching and rash.  Neurological: Negative.   Psychiatric/Behavioral: Negative.      Objective:   Vital Signs BP 128/85 (BP Location: Left Arm, Patient Position: Sitting, Cuff Size: Large)   Pulse 71   Temp (!) 97.1 F (36.2 C) (Temporal)   Ht 5\' 9"  (1.753 m)   Wt (!) 301 lb 3.2 oz (136.6 kg)   SpO2 95%   BMI 44.48 kg/m   Vital Signs and Nursing Note Reviewed Chaperone present Physical Exam  Constitutional: She is oriented to person, place, and time and well-developed, well-nourished, and in no distress.  HENT:  Head: Normocephalic and atraumatic.  Cardiovascular: Normal rate.  Pulmonary/Chest: Effort normal. She exhibits mass and tenderness. She exhibits no edema.    Abdominal: Soft. She exhibits no distension. There is no abdominal tenderness.  Musculoskeletal: Normal range of motion.        General: No tenderness, deformity or edema.  Neurological: She is alert and oriented to person, place, and time. Gait normal.  Skin: Skin is warm and dry. No rash noted. She is not diaphoretic. No erythema. No pallor.    Assessment/Plan:     ICD-10-CM   1. S/P mastectomy, bilateral  Z90.13   2. Postoperative breast asymmetry  N64.89   3. Malignant neoplasm of upper-outer quadrant of right breast in female, estrogen receptor positive ([place])  C50.411    Z17.0     From post-surgical stance, Michelle Mcmillan is doing really well. Continue eating healthy to promote good healing, weight loss and take a multivitamin.  In regards to the sternal area, it may possibly be the ribs protruding farther than expected due to expander being in place and with expansion, the ribs more lateral have become depressed, causing the area of concern to be more prominent and noticeable. Await CT results.  In regards to lipo-filling for symmetry and abdominoplasty, we would wait at minimum  3 months to allow for optimal healing time, implant settling and recovery. Due to her under going surgical intervention for her wrist, would expect surgery for lipo-filling and abdominoplasty/panniculectomy at the earliest in December.   Carola Rhine Laurance Heide, PA-C 12/25/2018, 11:45 AM

## 2018-12-25 ENCOUNTER — Encounter: Payer: Self-pay | Admitting: Surgical

## 2018-12-25 ENCOUNTER — Other Ambulatory Visit: Payer: Self-pay

## 2018-12-25 ENCOUNTER — Ambulatory Visit (INDEPENDENT_AMBULATORY_CARE_PROVIDER_SITE_OTHER): Payer: Medicaid Other | Admitting: Surgical

## 2018-12-25 VITALS — BP 128/85 | HR 71 | Temp 97.1°F | Ht 69.0 in | Wt 301.2 lb

## 2018-12-25 DIAGNOSIS — N6489 Other specified disorders of breast: Secondary | ICD-10-CM

## 2018-12-25 DIAGNOSIS — Z9013 Acquired absence of bilateral breasts and nipples: Secondary | ICD-10-CM

## 2018-12-25 DIAGNOSIS — Z170 Estrogen receptor positive status [ER+]: Secondary | ICD-10-CM

## 2018-12-25 DIAGNOSIS — C50411 Malignant neoplasm of upper-outer quadrant of right female breast: Secondary | ICD-10-CM

## 2019-01-05 ENCOUNTER — Other Ambulatory Visit: Payer: Self-pay | Admitting: *Deleted

## 2019-01-05 DIAGNOSIS — C50411 Malignant neoplasm of upper-outer quadrant of right female breast: Secondary | ICD-10-CM

## 2019-01-05 DIAGNOSIS — R229 Localized swelling, mass and lump, unspecified: Secondary | ICD-10-CM

## 2019-01-06 ENCOUNTER — Ambulatory Visit
Admission: RE | Admit: 2019-01-06 | Discharge: 2019-01-06 | Disposition: A | Discharge status: Home / Self Care | Payer: Medicaid Other | Source: Ambulatory Visit | Attending: Oncology | Admitting: Oncology

## 2019-01-06 DIAGNOSIS — C50411 Malignant neoplasm of upper-outer quadrant of right female breast: Secondary | ICD-10-CM | POA: Insufficient documentation

## 2019-01-06 DIAGNOSIS — R229 Localized swelling, mass and lump, unspecified: Secondary | ICD-10-CM | POA: Diagnosis not present

## 2019-01-10 ENCOUNTER — Other Ambulatory Visit: Payer: Self-pay | Admitting: Oncology

## 2019-01-12 ENCOUNTER — Telehealth: Payer: Self-pay | Admitting: *Deleted

## 2019-01-12 NOTE — Telephone Encounter (Signed)
Can we now pursue the CT chest I originally wanted?

## 2019-01-12 NOTE — Telephone Encounter (Signed)
Patient called asking for results of her CXR from last week and asking if any other tests need to be done. Please advise.  EXAM: CHEST - 2 VIEW  COMPARISON:  05/12/2017  FINDINGS: No focal opacity or pleural effusion. Normal cardiomediastinal silhouette. No pneumothorax. Postsurgical changes of the bilateral breasts with surgical clips. Ovoid opacity projects over the upper abdomen on lateral view and may reflect potential pill fragment.  IMPRESSION: No active cardiopulmonary disease.   Electronically Signed   By: Donavan Foil M.D.   On: 01/06/2019 22:54

## 2019-01-13 NOTE — Telephone Encounter (Signed)
Dr. Grayland Ormond, Hassan Rowan wants to know what to tell the patient for her X-ray result. We are still waiting for Mrs. Michelle Mcmillan to tell us about patient's CT approval.

## 2019-01-13 NOTE — Telephone Encounter (Signed)
I called patient and let her know her CXR results and that Jerene Pitch will call her regarding her CT appt

## 2019-01-13 NOTE — Telephone Encounter (Signed)
cxr was completely normal.

## 2019-01-13 NOTE — Telephone Encounter (Signed)
I will call her with results now and tell her you will call her about the CT scan

## 2019-01-13 NOTE — Telephone Encounter (Signed)
Good Morning, I r\s the CT scan for 10/20. If we could please work on authorization please. The X-ry they were wanting was completed. Thank you Jerene Pitch

## 2019-01-13 NOTE — Telephone Encounter (Signed)
I can call with the appt details, I just wanted to make sure someone had communicated her xray results first.

## 2019-01-18 ENCOUNTER — Telehealth: Payer: Self-pay

## 2019-01-18 NOTE — Telephone Encounter (Signed)

## 2019-01-19 ENCOUNTER — Other Ambulatory Visit: Payer: Self-pay | Admitting: Oncology

## 2019-01-19 ENCOUNTER — Ambulatory Visit
Admission: RE | Admit: 2019-01-19 | Discharge: 2019-01-19 | Disposition: A | Discharge status: Home / Self Care | Payer: Medicaid Other | Source: Ambulatory Visit | Attending: Oncology | Admitting: Oncology

## 2019-01-19 ENCOUNTER — Other Ambulatory Visit: Payer: Self-pay

## 2019-01-19 ENCOUNTER — Ambulatory Visit (INDEPENDENT_AMBULATORY_CARE_PROVIDER_SITE_OTHER): Payer: Medicaid Other | Admitting: Plastic Surgery

## 2019-01-19 DIAGNOSIS — R229 Localized swelling, mass and lump, unspecified: Secondary | ICD-10-CM | POA: Insufficient documentation

## 2019-01-19 DIAGNOSIS — Z9889 Other specified postprocedural states: Secondary | ICD-10-CM

## 2019-01-19 DIAGNOSIS — Z9013 Acquired absence of bilateral breasts and nipples: Secondary | ICD-10-CM

## 2019-01-19 LAB — POCT I-STAT CREATININE: Creatinine, Ser: 1 mg/dL (ref 0.44–1.00)

## 2019-01-19 MED ORDER — IOHEXOL 300 MG/ML  SOLN
75.0000 mL | Freq: Once | INTRAMUSCULAR | Status: AC | PRN
  Administered 2019-01-19: 08:00:00 75 mL via INTRAVENOUS

## 2019-01-19 NOTE — Progress Notes (Signed)
   Subjective:    Patient ID: Michelle Mcmillan, female    DOB: April 09, 1970, 48 y.o.   MRN: [id number]  The patient is a 48 yrs old female here for follow up after her bilateral breast reconstruction.  She has silicone implants in each breast.  She noticed a nodule over her sternal area that has her very concerned.  A CT scan was done and we should have the results back in the next day or 2.  All incisions healing well no sign of concern from her breast standpoint.     Review of Systems  Constitutional: Negative.   HENT: Negative.   Eyes: Negative.   Respiratory: Negative.   Cardiovascular: Negative.   Gastrointestinal: Negative.   Genitourinary: Negative.   Musculoskeletal: Negative.   Hematological: Negative.   Psychiatric/Behavioral: Negative.        Objective:   Physical Exam Constitutional:      Appearance: Normal appearance.  Cardiovascular:     Rate and Rhythm: Normal rate.  Pulmonary:     Effort: Pulmonary effort is normal.  Neurological:     General: No focal deficit present.     Mental Status: She is alert and oriented to person, place, and time.  Psychiatric:        Mood and Affect: Mood normal.        Behavior: Behavior normal.         Assessment & Plan:     ICD-10-CM   1. S/P mastectomy, bilateral  Z90.13   2. S/P breast reconstruction, bilateral  Z98.890     Plan to talk after the CT results.

## 2019-01-21 ENCOUNTER — Other Ambulatory Visit: Payer: Self-pay

## 2019-01-21 ENCOUNTER — Telehealth: Payer: Self-pay | Admitting: *Deleted

## 2019-01-21 NOTE — Telephone Encounter (Signed)
Patient called reporting that her PCP gave her CT results and told her she needs to have a PET scan done, She is calling our office to ask if it has been ordered and scheduled yet.  IMPRESSION: 2.5 cm soft tissue density in left anterior chest wall subcutaneous tissues, which abuts the medial aspect of the left pectoralis muscle. This is nonspecific, and differential diagnosis includes postop fibrosis and chest wall recurrence of breast carcinoma. Consider PET-CT scan for further evaluation.  No other significant abnormality within the thorax.   Electronically Signed   By: Marlaine Hind M.D.   On: 01/19/2019 08:22

## 2019-01-21 NOTE — Telephone Encounter (Signed)
Yes and I can do a video visit tomorrow to discuss?  Thanks!

## 2019-01-22 ENCOUNTER — Inpatient Hospital Stay: Payer: Medicaid Other | Attending: Oncology | Admitting: Oncology

## 2019-01-22 ENCOUNTER — Encounter: Payer: Self-pay | Admitting: Oncology

## 2019-01-22 DIAGNOSIS — R222 Localized swelling, mass and lump, trunk: Secondary | ICD-10-CM

## 2019-01-22 NOTE — Progress Notes (Signed)
MD to call pt for virtual visit regarding recent CT scan results.  Pt scheduled to have surgery on left hand January 29, 2019.

## 2019-01-23 NOTE — Progress Notes (Signed)
[facility]  [phone] Fax:([phone]) [phone]  ID: Michelle Mcmillan OB: 04/13/1970  MR#: [id number]  TGG#:[id number]  Patient Care Team: Donnie Coffin, MD as PCP - General (Family Medicine) Rico Junker, RN as Registered Nurse Theodore Demark, RN as Registered Nurse Lloyd Huger, MD as Consulting Physician (Oncology) Florene Glen, MD (Surgery) Dillingham, Loel Lofty, DO as Attending Physician (Plastic Surgery)  I connected with Michelle Mcmillan on 01/23/19 at  3:30 PM EDT by video enabled telemedicine visit and verified that I am speaking with the correct person using two identifiers.   I discussed the limitations, risks, security and privacy concerns of performing an evaluation and management service by telemedicine and the availability of in-person appointments. I also discussed with the patient that there may be a patient responsible charge related to this service. The patient expressed understanding and agreed to proceed.   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   INTERVAL HISTORY: Patient agreed to video enabled telemedicine visit to discuss her imaging results and additional diagnostic planning.  Subcutaneous swelling midline near her sternum remains nontender and is unchanged in size.  She otherwise feels well and is asymptomatic. She does not complain of peripheral neuropathy today and has no other neurologic complaints.  She denies any pain.  She has a good appetite and denies weight loss.  She has no chest pain, shortness of breath, cough, or hemoptysis.  She denies any nausea, vomiting, constipation, or diarrhea.  She has no urinary complaints.  Patient offers no further specific complaints today.  REVIEW OF  SYSTEMS:   Review of Systems  Constitutional: Negative.  Negative for diaphoresis, fever, malaise/fatigue and weight loss.  Respiratory: Negative.  Negative for cough and shortness of breath.   Cardiovascular: Negative.  Negative for chest pain and leg swelling.  Gastrointestinal: Negative.  Negative for abdominal pain, constipation, diarrhea, nausea and vomiting.  Genitourinary: Negative.  Negative for dysuria.  Musculoskeletal: Negative.  Negative for joint pain and myalgias.  Skin: Negative.  Negative for rash.  Neurological: Negative.  Negative for tingling, sensory change, focal weakness and weakness.  Psychiatric/Behavioral: Negative.  The patient is not nervous/anxious and does not have insomnia.     As per HPI. Otherwise, a complete review of systems is negative.  PAST MEDICAL HISTORY: Past Medical History:  Diagnosis Date   Anemia    H/O   Anemia    Arthritis    RIGHT KNEE   Breast cancer ([place]) 2017   right breast   Cancer ([place])    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)   Dyspnea    GERD (gastroesophageal reflux disease)    NO MEDS   Headache    migraines   Hypertension    Last menstrual period (LMP) > 10 days ago 08/2015   Lower extremity edema    Neuropathy    Personal history of radiation therapy     PAST SURGICAL HISTORY: Past Surgical History:  Procedure Laterality Date   BREAST BIOPSY Right 05/08/2017   invasive mamm. ca   BREAST EXCISIONAL BIOPSY Right 06/15/15   breast cancer   BREAST LUMPECTOMY WITH NEEDLE LOCALIZATION Right 06/15/2015   Procedure: BREAST LUMPECTOMY WITH NEEDLE LOCALIZATION;  Surgeon: Hubbard Robinson, MD;  Location: [facility];  Service: General;  Laterality: Right;  BREAST RECONSTRUCTION WITH PLACEMENT OF TISSUE EXPANDER AND FLEX HD (ACELLULAR HYDRATED DERMIS) Left 02/09/2018   Procedure: BREAST RECONSTRUCTION WITH PLACEMENT OF TISSUE EXPANDER AND FLEX HD (ACELLULAR HYDRATED DERMIS);  Surgeon: Wallace Going, DO;  Location:  [facility];  Service: Plastics;  Laterality: Left;   BREAST SURGERY     CESAREAN SECTION  1988   x1   COLONOSCOPY WITH PROPOFOL N/A 01/23/2017   Procedure: COLONOSCOPY WITH PROPOFOL;  Surgeon: Lin Landsman, MD;  Location[facility] END[facility]COPY;  Service: Gastroenterology;  Laterality: N/A;   ESOPHAGOGASTRODUODENOSCOPY (EGD) WITH PROPOFOL N/A 01/23/2017   Procedure: ESOPHAGOGASTRODUODENOSCOPY (EGD) WITH PROPOFOL;  Surgeon: Lin Landsman, MD;  Location: [facility] ENDOSCOPY;  Service: Gastroenterology;  Laterality: N/A;   LATISSIMUS FLAP TO BREAST Right 06/01/2018   Procedure: RIGHT BREAST RECONSTRUCTION WITH LATISSIMUS MYOCUTANEOUS FLAP;  Surgeon: Wallace Going, DO;  Location: [place];  Service: Plastics;  Laterality: Right;   LIPOSUCTION WITH LIPOFILLING Bilateral 10/15/2018   Procedure: LIPOSUCTION TO BILATERAL BREASTS;  Surgeon: Wallace Going, DO;  Location: [place];  Service: Plastics;  Laterality: Bilateral;   MASS EXCISION N/A 05/20/2016   Procedure: EXCISION CHEST WALL MASS;  Surgeon: Florene Glen, MD;  Location: [facility];  Service: General;  Laterality: N/A;   RECONSTRUCTION BREAST W/ LATISSIMUS DORSI FLAP Right 06/01/2018   REMOVAL OF BILATERAL TISSUE EXPANDERS WITH PLACEMENT OF BILATERAL BREAST IMPLANTS Bilateral 10/15/2018   Procedure: REMOVAL OF BILATERAL TISSUE EXPANDERS WITH PLACEMENT OF BILATERAL BREAST IMPLANTS;  Surgeon: Wallace Going, DO;  Location: [place];  Service: Plastics;  Laterality: Bilateral;   SENTINEL NODE BIOPSY Right 06/15/2015   Procedure: SENTINEL NODE BIOPSY;  Surgeon: Hubbard Robinson, MD;  Location: [facility];  Service: General;  Laterality: Right;   TOTAL MASTECTOMY Right 05/22/2017   Procedure: TOTAL MASTECTOMY;  Surgeon: Florene Glen, MD;  Location: [facility];  Service: General;  Laterality: Right;   TOTAL MASTECTOMY Left 02/09/2018   Procedure: LEFT PROPHYLACTIC  MASTECTOMY;  Surgeon: Jovita Kussmaul, MD;  Location: [facility];  Service: General;  Laterality: Left;   TUBAL LIGATION  2004    FAMILY HISTORY Family History  Problem Relation Age of Onset   Hypertension Mother    Hypertension Father    Hypertension Sister    Breast cancer Neg Hx        ADVANCED DIRECTIVES:    HEALTH MAINTENANCE: Social History   Tobacco Use   Smoking status: Never Smoker   Smokeless tobacco: Never Used  Substance Use Topics   Alcohol use: Not Currently    Comment: WINE ONCE MONTH   Drug use: No    Allergies  Allergen Reactions   Nitroglycerin Other (See Comments)    Headache, n/v.    Current Outpatient Medications  Medication Sig Dispense Refill   diazepam (VALIUM) 2 MG tablet Take 1 tablet (2 mg total) by mouth every 12 (twelve) hours as needed for anxiety or muscle spasms. 30 tablet 0   diclofenac sodium (VOLTAREN) 1 % GEL Apply 4 g topically 4 (four) times daily.     escitalopram (LEXAPRO) 20 MG tablet TAKE 1 TABLET BY MOUTH ONCE DAILY FOR MOOD     Ferrous Sulfate (IRON) 325 (65 Fe) MG TABS Take 1 tablet (325 mg total) by mouth 2 (two) times daily. 60 each 2   gabapentin (NEURONTIN) 300 MG capsule TAKE 1 CAPSULE BY MOUTH THREE TIMES DAILY .MAY  TAKE 2 CAPSULE AT  BEDTIME IF MORE BOTHERSOME  AT NIGHT 90 capsule 3   hydrochlorothiazide (HYDRODIURIL) 25 MG tablet Take 25 mg by mouth daily.     ibuprofen (ADVIL) 800 MG tablet TAKE 1 TABLET BY MOUTH EVERY 8 HOURS WITH FOOD AS NEEDED FOR PAIN     letrozole (FEMARA) 2.5 MG tablet Take 1 tablet by mouth once daily 90 tablet 3   omeprazole (PRILOSEC) 20 MG capsule Take 1 capsule (20 mg total) by mouth daily. 30 capsule 3   potassium chloride SA (K-DUR,KLOR-CON) 20 MEQ tablet Take 1 tablet (20 mEq total) by mouth 2 (two) times daily. 30 tablet 1   tetrahydrozoline 0.05 % ophthalmic solution Place 2 drops into both eyes daily.     traZODone (DESYREL) 50 MG tablet Take 50 mg by mouth at bedtime.     No current  facility-administered medications for this visit.     OBJECTIVE: There were no vitals filed for this visit.   There is no height or weight on file to calculate BMI.    ECOG FS:0 - Asymptomatic  General: Well-developed, well-nourished, no acute distress. HEENT: Normocephalic. Neuro: Alert, answering all questions appropriately. Cranial nerves grossly intact. Psych: Normal affect.  LAB RESULTS:  Lab Results  Component Value Date   NA 141 10/09/2018   K 3.9 10/09/2018   CL 102 10/09/2018   CO2 30 10/09/2018   GLUCOSE 91 10/09/2018   BUN 19 10/09/2018   CREATININE 1.00 01/19/2019   CALCIUM 9.3 10/09/2018   PROT 6.9 10/21/2017   ALBUMIN 3.4 (L) 10/21/2017   AST 37 10/21/2017   ALT 48 (H) 10/21/2017   ALKPHOS 79 10/21/2017   BILITOT 0.4 10/21/2017   GFRNONAA >60 10/09/2018   GFRAA >60 10/09/2018    Lab Results  Component Value Date   WBC 7.9 06/02/2018   NEUTROABS 3.7 10/21/2017   HGB 9.5 (L) 06/02/2018   HCT 31.6 (L) 06/02/2018   MCV 77.6 (L) 06/02/2018   PLT 216 06/02/2018     STUDIES: Dg Chest 2 View  Result Date: 01/06/2019 CLINICAL DATA:  Subcutaneous nodule EXAM: CHEST - 2 VIEW COMPARISON:  05/12/2017 FINDINGS: No focal opacity or pleural effusion. Normal cardiomediastinal silhouette. No pneumothorax. Postsurgical changes of the bilateral breasts with surgical clips. Ovoid opacity projects over the upper abdomen on lateral view and may reflect potential pill fragment. IMPRESSION: No active cardiopulmonary disease. Electronically Signed   By: Donavan Foil M.D.   On: 01/06/2019 22:54   Ct Chest W Contrast  Result Date: 01/19/2019 CLINICAL DATA:  Palpable abnormality in anterior chest wall. Previous bilateral mastectomies for breast carcinoma. EXAM: CT CHEST WITH CONTRAST TECHNIQUE: Multidetector CT imaging of the chest was performed during intravenous contrast administration. CONTRAST:  80m OMNIPAQUE IOHEXOL 300 MG/ML  SOLN COMPARISON:  None FINDINGS:  Cardiovascular:  No acute findings. Mediastinum/Nodes: No masses or pathologically enlarged lymph nodes identified. Lungs/Pleura: No pulmonary infiltrate or mass identified. No effusion present. Upper Abdomen:  Unremarkable. Musculoskeletal: No suspicious bone lesions. Bilateral subpectoral breast implants are seen. An asymmetric soft tissue density is seen in the subcutaneous tissues of the left paramedian anterior chest wall which abuts the medial aspect of the left pectoralis muscle. This measures 2.5 x 2.0 cm on image 47/2, and is nonspecific. IMPRESSION: 2.5 cm soft tissue density in left anterior chest wall subcutaneous tissues, which abuts the medial aspect of the left pectoralis muscle. This is nonspecific, and differential diagnosis includes postop fibrosis and chest wall recurrence of breast carcinoma. Consider PET-CT scan for further evaluation. No other significant  abnormality within the thorax. Electronically Signed   By: Marlaine Hind M.D.   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 BRCA 1 and 2 negative, Oncotype DX score 17 which is considered low risk.  PLAN:    1. 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  This occurred while patient was taking Aromasin.  Oncotype was not sent on her second breast cancer.  Previously, CT scans and bone scans reviewed independently with no evidence of metastatic disease confirming stage I malignancy.  Patient completed cycle 5 of Taxotere and Cytoxan on September 16, 2017.  Patient received 1 additional cycle secondary to a reaction she had during cycle 1 and did not complete treatment at that time.  Continue letrozole for minimum 5 years  completing in June 2024.  Patient has been instructed to keep her previously scheduled follow-up as scheduled. 2. Genetic testing: Patient was found to be BRCA 1 and 2 negative. She expressed understanding that although her genetic testing was negative, her children are at higher risk for breast cancer than the general population.   3.  Neuropathy: Patient does not complain of this today.  Continue gabapentin as prescribed. 4.  Breast reconstruction: Patient has had bilateral mastectomy.  Continue follow-up with plastic surgery on Tuesday, February 02, 2019 as scheduled. 5.  Chest wall mass: CT scan results from January 19, 2019 reviewed independently and reported as above with nonspecific 2.5 cm chest wall mass.  Recommendation was to assess lesion with PET scan to determine whether this is fibrous tissue or recurrence of disease.  If PET scan is positive, patient will require biopsy for further evaluation.  I provided 25 minutes of face-to-face video visit time during this encounter, and > 50% was spent counseling as documented under my assessment & plan.  Patient expressed understanding and was in agreement with this plan. She also understands that She can call clinic at any time with any questions, concerns, or complaints.    Lloyd Huger, MD 01/23/19 8:16 AM

## 2019-01-25 ENCOUNTER — Encounter (HOSPITAL_BASED_OUTPATIENT_CLINIC_OR_DEPARTMENT_OTHER)
Admission: RE | Admit: 2019-01-25 | Discharge: 2019-01-25 | Disposition: A | Discharge status: Home / Self Care | Payer: Medicaid Other | Source: Ambulatory Visit | Attending: Orthopedic Surgery | Admitting: Orthopedic Surgery

## 2019-01-25 DIAGNOSIS — Z01812 Encounter for preprocedural laboratory examination: Secondary | ICD-10-CM | POA: Insufficient documentation

## 2019-01-25 LAB — BASIC METABOLIC PANEL
Anion gap: 9 (ref 5–15)
BUN: 18 mg/dL (ref 6–20)
CO2: 30 mmol/L (ref 22–32)
Calcium: 9.3 mg/dL (ref 8.9–10.3)
Chloride: 102 mmol/L (ref 98–111)
Creatinine, Ser: 1.07 mg/dL — ABNORMAL HIGH (ref 0.44–1.00)
GFR calc Af Amer: >60 mL/min (ref >60)
GFR calc non Af Amer: >60 mL/min (ref >60)
Glucose, Bld: 88 mg/dL (ref 70–99)
Potassium: 3.8 mmol/L (ref 3.5–5.1)
Sodium: 141 mmol/L (ref 135–145)

## 2019-01-25 LAB — POCT PREGNANCY, URINE: Preg Test, Ur: NEGATIVE

## 2019-01-25 NOTE — Progress Notes (Signed)

## 2019-01-26 ENCOUNTER — Other Ambulatory Visit
Admission: RE | Admit: 2019-01-26 | Discharge: 2019-01-26 | Disposition: A | Discharge status: Home / Self Care | Payer: Medicaid Other | Source: Ambulatory Visit | Attending: Orthopedic Surgery | Admitting: Orthopedic Surgery

## 2019-01-26 ENCOUNTER — Other Ambulatory Visit: Payer: Self-pay

## 2019-01-26 DIAGNOSIS — Z20828 Contact with and (suspected) exposure to other viral communicable diseases: Secondary | ICD-10-CM | POA: Insufficient documentation

## 2019-01-26 DIAGNOSIS — Z01812 Encounter for preprocedural laboratory examination: Secondary | ICD-10-CM | POA: Diagnosis not present

## 2019-01-26 LAB — SARS CORONAVIRUS 2 (TAT 6-24 HRS): SARS Coronavirus 2: NEGATIVE

## 2019-01-27 ENCOUNTER — Other Ambulatory Visit: Payer: Self-pay | Admitting: Orthopedic Surgery

## 2019-01-29 ENCOUNTER — Ambulatory Visit (HOSPITAL_BASED_OUTPATIENT_CLINIC_OR_DEPARTMENT_OTHER): Payer: Medicaid Other | Admitting: Anesthesiology

## 2019-01-29 ENCOUNTER — Ambulatory Visit (HOSPITAL_BASED_OUTPATIENT_CLINIC_OR_DEPARTMENT_OTHER)
Admission: RE | Admit: 2019-01-29 | Discharge: 2019-01-29 | Disposition: A | Discharge status: Home / Self Care | Payer: Medicaid Other | Attending: Orthopedic Surgery | Admitting: Orthopedic Surgery

## 2019-01-29 ENCOUNTER — Other Ambulatory Visit: Payer: Self-pay

## 2019-01-29 ENCOUNTER — Encounter (HOSPITAL_BASED_OUTPATIENT_CLINIC_OR_DEPARTMENT_OTHER): Payer: Self-pay

## 2019-01-29 ENCOUNTER — Encounter (HOSPITAL_BASED_OUTPATIENT_CLINIC_OR_DEPARTMENT_OTHER)
Admission: RE | Disposition: A | Discharge status: Home / Self Care | Payer: Self-pay | Source: Home / Self Care | Attending: Orthopedic Surgery

## 2019-01-29 DIAGNOSIS — Z79811 Long term (current) use of aromatase inhibitors: Secondary | ICD-10-CM | POA: Insufficient documentation

## 2019-01-29 DIAGNOSIS — I10 Essential (primary) hypertension: Secondary | ICD-10-CM | POA: Diagnosis not present

## 2019-01-29 DIAGNOSIS — M1711 Unilateral primary osteoarthritis, right knee: Secondary | ICD-10-CM | POA: Insufficient documentation

## 2019-01-29 DIAGNOSIS — G629 Polyneuropathy, unspecified: Secondary | ICD-10-CM | POA: Diagnosis not present

## 2019-01-29 DIAGNOSIS — G5622 Lesion of ulnar nerve, left upper limb: Secondary | ICD-10-CM | POA: Diagnosis not present

## 2019-01-29 DIAGNOSIS — K219 Gastro-esophageal reflux disease without esophagitis: Secondary | ICD-10-CM | POA: Insufficient documentation

## 2019-01-29 DIAGNOSIS — Z888 Allergy status to other drugs, medicaments and biological substances status: Secondary | ICD-10-CM | POA: Insufficient documentation

## 2019-01-29 DIAGNOSIS — Z853 Personal history of malignant neoplasm of breast: Secondary | ICD-10-CM | POA: Insufficient documentation

## 2019-01-29 DIAGNOSIS — Z79899 Other long term (current) drug therapy: Secondary | ICD-10-CM | POA: Diagnosis not present

## 2019-01-29 HISTORY — PX: ULNAR NERVE TRANSPOSITION: SHX2595

## 2019-01-29 SURGERY — ULNAR NERVE DECOMPRESSION/TRANSPOSITION
Anesthesia: General | Site: Wrist | Laterality: Left

## 2019-01-29 MED ORDER — BUPIVACAINE HCL (PF) 0.25 % IJ SOLN
INTRAMUSCULAR | Status: DC | PRN
  Administered 2019-01-29: 10 mL

## 2019-01-29 MED ORDER — KETOROLAC TROMETHAMINE 30 MG/ML IJ SOLN
30.0000 mg | Freq: Once | INTRAMUSCULAR | Status: AC | PRN
  Administered 2019-01-29: 30 mg via INTRAVENOUS

## 2019-01-29 MED ORDER — BUPIVACAINE HCL (PF) 0.25 % IJ SOLN
INTRAMUSCULAR | Status: AC
  Filled 2019-01-29: qty 30

## 2019-01-29 MED ORDER — CHLORHEXIDINE GLUCONATE 4 % EX LIQD: 60.0000 mL | Freq: Once | CUTANEOUS | Status: DC

## 2019-01-29 MED ORDER — PROMETHAZINE HCL 25 MG/ML IJ SOLN: 6.2500 mg | INTRAMUSCULAR | Status: DC | PRN

## 2019-01-29 MED ORDER — DEXAMETHASONE SODIUM PHOSPHATE 10 MG/ML IJ SOLN
INTRAMUSCULAR | Status: DC | PRN
  Administered 2019-01-29: 10 mg via INTRAVENOUS

## 2019-01-29 MED ORDER — CEFAZOLIN SODIUM-DEXTROSE 2-4 GM/100ML-% IV SOLN: 2.0000 g | INTRAVENOUS | Status: DC

## 2019-01-29 MED ORDER — FENTANYL CITRATE (PF) 100 MCG/2ML IJ SOLN
50.0000 ug | INTRAMUSCULAR | Status: AC | PRN
  Administered 2019-01-29 (×4): 25 ug via INTRAVENOUS

## 2019-01-29 MED ORDER — ONDANSETRON HCL 4 MG/2ML IJ SOLN
INTRAMUSCULAR | Status: AC
  Filled 2019-01-29: qty 2

## 2019-01-29 MED ORDER — FENTANYL CITRATE (PF) 100 MCG/2ML IJ SOLN
INTRAMUSCULAR | Status: AC
  Filled 2019-01-29: qty 2

## 2019-01-29 MED ORDER — DEXAMETHASONE SODIUM PHOSPHATE 10 MG/ML IJ SOLN
INTRAMUSCULAR | Status: AC
  Filled 2019-01-29: qty 1

## 2019-01-29 MED ORDER — LIDOCAINE 2% (20 MG/ML) 5 ML SYRINGE
INTRAMUSCULAR | Status: AC
  Filled 2019-01-29: qty 5

## 2019-01-29 MED ORDER — HYDROCODONE-ACETAMINOPHEN 5-325 MG PO TABS: ORAL_TABLET | ORAL | 0 refills | Status: DC

## 2019-01-29 MED ORDER — CEFAZOLIN SODIUM-DEXTROSE 2-4 GM/100ML-% IV SOLN
INTRAVENOUS | Status: AC
  Filled 2019-01-29: qty 100

## 2019-01-29 MED ORDER — KETOROLAC TROMETHAMINE 30 MG/ML IJ SOLN
INTRAMUSCULAR | Status: AC
  Filled 2019-01-29: qty 1

## 2019-01-29 MED ORDER — LIDOCAINE HCL (CARDIAC) PF 100 MG/5ML IV SOSY
PREFILLED_SYRINGE | INTRAVENOUS | Status: DC | PRN
  Administered 2019-01-29: 100 mg via INTRAVENOUS

## 2019-01-29 MED ORDER — LACTATED RINGERS IV SOLN
INTRAVENOUS | Status: DC
  Administered 2019-01-29: 14:00:00 via INTRAVENOUS

## 2019-01-29 MED ORDER — MIDAZOLAM HCL 2 MG/2ML IJ SOLN: 1.0000 mg | INTRAMUSCULAR | Status: DC | PRN

## 2019-01-29 MED ORDER — FENTANYL CITRATE (PF) 100 MCG/2ML IJ SOLN
25.0000 ug | INTRAMUSCULAR | Status: DC | PRN
  Administered 2019-01-29: 25 ug via INTRAVENOUS

## 2019-01-29 MED ORDER — EPHEDRINE SULFATE 50 MG/ML IJ SOLN
INTRAMUSCULAR | Status: DC | PRN
  Administered 2019-01-29: 15 mg via INTRAVENOUS

## 2019-01-29 MED ORDER — GLYCOPYRROLATE 0.2 MG/ML IJ SOLN
INTRAMUSCULAR | Status: DC | PRN
  Administered 2019-01-29: 0.2 mg via INTRAVENOUS

## 2019-01-29 MED ORDER — PROPOFOL 10 MG/ML IV BOLUS
INTRAVENOUS | Status: DC | PRN
  Administered 2019-01-29: 200 mg via INTRAVENOUS

## 2019-01-29 MED ORDER — SUCCINYLCHOLINE CHLORIDE 200 MG/10ML IV SOSY
PREFILLED_SYRINGE | INTRAVENOUS | Status: AC
  Filled 2019-01-29: qty 10

## 2019-01-29 MED ORDER — CEFAZOLIN SODIUM-DEXTROSE 2-3 GM-%(50ML) IV SOLR
INTRAVENOUS | Status: DC | PRN
  Administered 2019-01-29: 3 g via INTRAVENOUS

## 2019-01-29 MED ORDER — PROPOFOL 10 MG/ML IV BOLUS
INTRAVENOUS | Status: AC
  Filled 2019-01-29: qty 40

## 2019-01-29 SURGICAL SUPPLY — 54 items
BLADE MINI RND TIP GREEN BEAV (BLADE) IMPLANT
BLADE SURG 15 STRL LF DISP TIS (BLADE) ×2 IMPLANT
BLADE SURG 15 STRL SS (BLADE) ×4
BNDG ELASTIC 3X5.8 VLCR STR LF (GAUZE/BANDAGES/DRESSINGS) ×4 IMPLANT
BNDG ELASTIC 4X5.8 VLCR STR LF (GAUZE/BANDAGES/DRESSINGS) ×1 IMPLANT
BNDG ESMARK 4X9 LF (GAUZE/BANDAGES/DRESSINGS) ×3 IMPLANT
BNDG GAUZE ELAST 4 BULKY (GAUZE/BANDAGES/DRESSINGS) ×3 IMPLANT
CHLORAPREP W/TINT 26 (MISCELLANEOUS) ×3 IMPLANT
CORD BIPOLAR FORCEPS 12FT (ELECTRODE) ×3 IMPLANT
COVER BACK TABLE REUSABLE LG (DRAPES) ×3 IMPLANT
COVER MAYO STAND REUSABLE (DRAPES) ×3 IMPLANT
COVER WAND RF STERILE (DRAPES) IMPLANT
CUFF TOURN SGL QUICK 18X3 (MISCELLANEOUS) ×3 IMPLANT
CUFF TOURN SGL QUICK 18X4 (TOURNIQUET CUFF) IMPLANT
DECANTER SPIKE VIAL GLASS SM (MISCELLANEOUS) ×2 IMPLANT
DRAPE EXTREMITY T 121X128X90 (DISPOSABLE) ×3 IMPLANT
DRAPE SURG 17X23 STRL (DRAPES) ×3 IMPLANT
DRSG PAD ABDOMINAL 8X10 ST (GAUZE/BANDAGES/DRESSINGS) ×3 IMPLANT
GAUZE 4X4 16PLY RFD (DISPOSABLE) IMPLANT
GAUZE SPONGE 4X4 12PLY STRL (GAUZE/BANDAGES/DRESSINGS) ×3 IMPLANT
GAUZE XEROFORM 1X8 LF (GAUZE/BANDAGES/DRESSINGS) ×3 IMPLANT
GLOVE BIO SURGEON STRL SZ7.5 (GLOVE) ×5 IMPLANT
GLOVE BIOGEL PI IND STRL 8 (GLOVE) ×1 IMPLANT
GLOVE BIOGEL PI IND STRL 8.5 (GLOVE) ×1 IMPLANT
GLOVE BIOGEL PI INDICATOR 8 (GLOVE) ×2
GLOVE BIOGEL PI INDICATOR 8.5 (GLOVE) ×2
GLOVE SURG ORTHO 8.0 STRL STRW (GLOVE) ×3 IMPLANT
GOWN STRL REUS W/ TWL LRG LVL3 (GOWN DISPOSABLE) ×1 IMPLANT
GOWN STRL REUS W/TWL LRG LVL3 (GOWN DISPOSABLE) ×2
GOWN STRL REUS W/TWL XL LVL3 (GOWN DISPOSABLE) ×6 IMPLANT
NDL HYPO 25X1 1.5 SAFETY (NEEDLE) IMPLANT
NEEDLE HYPO 25X1 1.5 SAFETY (NEEDLE) IMPLANT
NS IRRIG 1000ML POUR BTL (IV SOLUTION) ×3 IMPLANT
PACK BASIN DAY SURGERY FS (CUSTOM PROCEDURE TRAY) ×3 IMPLANT
PAD CAST 3X4 CTTN HI CHSV (CAST SUPPLIES) ×1 IMPLANT
PAD CAST 4YDX4 CTTN HI CHSV (CAST SUPPLIES) ×1 IMPLANT
PADDING CAST ABS 4INX4YD NS (CAST SUPPLIES) ×2
PADDING CAST ABS COTTON 4X4 ST (CAST SUPPLIES) ×1 IMPLANT
PADDING CAST COTTON 3X4 STRL (CAST SUPPLIES) ×2
PADDING CAST COTTON 4X4 STRL (CAST SUPPLIES) ×2
SLEEVE SCD COMPRESS KNEE MED (MISCELLANEOUS) ×3 IMPLANT
SPLINT FAST PLASTER 5X30 (CAST SUPPLIES)
SPLINT PLASTER CAST FAST 5X30 (CAST SUPPLIES) IMPLANT
SPLINT PLASTER CAST XFAST 3X15 (CAST SUPPLIES) IMPLANT
SPLINT PLASTER XTRA FASTSET 3X (CAST SUPPLIES)
STOCKINETTE 4X48 STRL (DRAPES) ×3 IMPLANT
SUT ETHILON 4 0 PS 2 18 (SUTURE) ×3 IMPLANT
SUT VIC AB 2-0 SH 27 (SUTURE) ×2
SUT VIC AB 2-0 SH 27XBRD (SUTURE) ×1 IMPLANT
SUT VICRYL 4-0 PS2 18IN ABS (SUTURE) IMPLANT
SYR BULB 3OZ (MISCELLANEOUS) ×3 IMPLANT
SYR CONTROL 10ML LL (SYRINGE) IMPLANT
TOWEL GREEN STERILE FF (TOWEL DISPOSABLE) ×3 IMPLANT
UNDERPAD 30X36 HEAVY ABSORB (UNDERPADS AND DIAPERS) ×3 IMPLANT

## 2019-01-29 NOTE — Transfer of Care (Signed)
Immediate Anesthesia Transfer of Care Note  Patient: Michelle Mcmillan  Procedure(s) Performed: LEFT ULNAR NERVE DECOMPRESSION/TRANSPOSITION (Left Wrist)  Patient Location: PACU  Anesthesia Type:General  Level of Consciousness: awake, alert  and oriented  Airway & Oxygen Therapy: Patient Spontanous Breathing and Patient connected to face mask oxygen  Post-op Assessment: Report given to RN and Post -op Vital signs reviewed and stable  Post vital signs: Reviewed and stable  Last Vitals:  Vitals Value Taken Time  BP    Temp    Pulse    Resp    SpO2      Last Pain:  Vitals:   01/29/19 1233  TempSrc: Oral  PainSc: 0-No pain         Complications: No apparent anesthesia complications

## 2019-01-29 NOTE — Anesthesia Preprocedure Evaluation (Signed)
Anesthesia Evaluation  Patient identified by MRN, date of birth, ID band Patient awake    Reviewed: Allergy & Precautions, NPO status , Patient's Chart, lab work & pertinent test results  Airway Mallampati: II  TM Distance: >3 FB Neck ROM: Full    Dental no notable dental hx.    Pulmonary neg pulmonary ROS,    Pulmonary exam normal breath sounds clear to auscultation       Cardiovascular hypertension, Normal cardiovascular exam Rhythm:Regular Rate:Normal     Neuro/Psych negative neurological ROS  negative psych ROS   GI/Hepatic negative GI ROS, Neg liver ROS,   Endo/Other  Morbid obesity  Renal/GU negative Renal ROS  negative genitourinary   Musculoskeletal negative musculoskeletal ROS (+)   Abdominal   Peds negative pediatric ROS (+)  Hematology negative hematology ROS (+)   Anesthesia Other Findings   Reproductive/Obstetrics negative OB ROS                             Anesthesia Physical Anesthesia Plan  ASA: III  Anesthesia Plan: General   Post-op Pain Management:    Induction: Intravenous  PONV Risk Score and Plan: 3 and Ondansetron, Dexamethasone and Treatment may vary due to age or medical condition  Airway Management Planned: LMA  Additional Equipment:   Intra-op Plan:   Post-operative Plan: Extubation in OR  Informed Consent: I have reviewed the patients History and Physical, chart, labs and discussed the procedure including the risks, benefits and alternatives for the proposed anesthesia with the patient or authorized representative who has indicated his/her understanding and acceptance.     Dental advisory given  Plan Discussed with: CRNA and Surgeon  Anesthesia Plan Comments:         Anesthesia Quick Evaluation

## 2019-01-29 NOTE — Anesthesia Procedure Notes (Signed)
Procedure Name: LMA Insertion Performed by: Verita Lamb, CRNA Pre-anesthesia Checklist: Patient identified, Emergency Drugs available, Suction available and Patient being monitored Patient Re-evaluated:Patient Re-evaluated prior to induction Oxygen Delivery Method: Circle system utilized Preoxygenation: Pre-oxygenation with 100% oxygen Induction Type: IV induction Ventilation: Mask ventilation without difficulty LMA: LMA inserted LMA Size: 5.0 Tube type: Oral Number of attempts: 1 Airway Equipment and Method: Bite block Placement Confirmation: positive ETCO2,  CO2 detector and breath sounds checked- equal and bilateral Tube secured with: Tape Dental Injury: Teeth and Oropharynx as per pre-operative assessment

## 2019-01-29 NOTE — H&P (Signed)
Michelle Mcmillan is an 48 y.o. female.   Chief Complaint: left ulnar nerve compression at wrist HPI: 48 yo female with numbness and tingling left ring and small fingers.  Positive nerve conduction studies.  She wishes to have left ulnar nerve decompression at the wrist.  Allergies:  Allergies  Allergen Reactions  . Nitroglycerin Other (See Comments)    Headache, n/v.    Past Medical History:  Diagnosis Date  . Anemia    H/O  . Anemia   . Arthritis    RIGHT KNEE  . Breast cancer ([place]) 2017   right breast  . Cancer ([place])    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)  . Dyspnea   . GERD (gastroesophageal reflux disease)    NO MEDS  . Headache    migraines  . Hypertension   . Last menstrual period (LMP) > 10 days ago 08/2015  . Lower extremity edema   . Neuropathy   . Personal history of radiation therapy     Past Surgical History:  Procedure Laterality Date  . BREAST BIOPSY Right 05/08/2017   invasive mamm. ca  . BREAST EXCISIONAL BIOPSY Right 06/15/15   breast cancer  . BREAST LUMPECTOMY WITH NEEDLE LOCALIZATION Right 06/15/2015   Procedure: BREAST LUMPECTOMY WITH NEEDLE LOCALIZATION;  Surgeon: Hubbard Robinson, MD;  Location: [facility];  Service: General;  Laterality: Right;  . BREAST RECONSTRUCTION WITH PLACEMENT OF TISSUE EXPANDER AND FLEX HD (ACELLULAR HYDRATED DERMIS) Left 02/09/2018   Procedure: BREAST RECONSTRUCTION WITH PLACEMENT OF TISSUE EXPANDER AND FLEX HD (ACELLULAR HYDRATED DERMIS);  Surgeon: Wallace Going, DO;  Location: [facility];  Service: Plastics;  Laterality: Left;  . BREAST SURGERY    . CESAREAN SECTION  1988   x1  . COLONOSCOPY WITH PROPOFOL N/A 01/23/2017   Procedure: COLONOSCOPY WITH PROPOFOL;  Surgeon: Lin Landsman, MD;  Location: [facility] ENDOSCOPY;  Service: Gastroenterology;  Laterality: N/A;  . ESOPHAGOGASTRODUODENOSCOPY (EGD) WITH PROPOFOL N/A 01/23/2017   Procedure: ESOPHAGOGASTRODUODENOSCOPY (EGD) WITH PROPOFOL;  Surgeon: Lin Landsman, MD;   Location: [facility] ENDOSCOPY;  Service: Gastroenterology;  Laterality: N/A;  . LATISSIMUS FLAP TO BREAST Right 06/01/2018   Procedure: RIGHT BREAST RECONSTRUCTION WITH LATISSIMUS MYOCUTANEOUS FLAP;  Surgeon: Wallace Going, DO;  Location: [place];  Service: Plastics;  Laterality: Right;  . LIPOSUCTION WITH LIPOFILLING Bilateral 10/15/2018   Procedure: LIPOSUCTION TO BILATERAL BREASTS;  Surgeon: Wallace Going, DO;  Location: [place];  Service: Plastics;  Laterality: Bilateral;  . MASS EXCISION N/A 05/20/2016   Procedure: EXCISION CHEST WALL MASS;  Surgeon: Florene Glen, MD;  Location: [facility];  Service: General;  Laterality: N/A;  . RECONSTRUCTION BREAST W/ LATISSIMUS DORSI FLAP Right 06/01/2018  . REMOVAL OF BILATERAL TISSUE EXPANDERS WITH PLACEMENT OF BILATERAL BREAST IMPLANTS Bilateral 10/15/2018   Procedure: REMOVAL OF BILATERAL TISSUE EXPANDERS WITH PLACEMENT OF BILATERAL BREAST IMPLANTS;  Surgeon: Wallace Going, DO;  Location: [place];  Service: Plastics;  Laterality: Bilateral;  . SENTINEL NODE BIOPSY Right 06/15/2015   Procedure: SENTINEL NODE BIOPSY;  Surgeon: Hubbard Robinson, MD;  Location: [facility];  Service: General;  Laterality: Right;  . TOTAL MASTECTOMY Right 05/22/2017   Procedure: TOTAL MASTECTOMY;  Surgeon: Florene Glen, MD;  Location: [facility];  Service: General;  Laterality: Right;  . TOTAL MASTECTOMY Left 02/09/2018   Procedure: LEFT PROPHYLACTIC  MASTECTOMY;  Surgeon: Jovita Kussmaul, MD;  Location: [facility];  Service: General;  Laterality: Left;  . TUBAL LIGATION  2004    Family History: Family History  Problem Relation Age of Onset  . Hypertension Mother   . Hypertension Father   . Hypertension Sister   . Breast cancer Neg Hx     Social History:   reports that she has never smoked. She has never used smokeless tobacco. She reports previous alcohol use. She reports that she does not use  drugs.  Medications: Medications Prior to Admission  Medication Sig Dispense Refill  . diazepam (VALIUM) 2 MG tablet Take 1 tablet (2 mg total) by mouth every 12 (twelve) hours as needed for anxiety or muscle spasms. 30 tablet 0  . diclofenac sodium (VOLTAREN) 1 % GEL Apply 4 g topically 4 (four) times daily.    Marland Kitchen escitalopram (LEXAPRO) 20 MG tablet TAKE 1 TABLET BY MOUTH ONCE DAILY FOR MOOD    . Ferrous Sulfate (IRON) 325 (65 Fe) MG TABS Take 1 tablet (325 mg total) by mouth 2 (two) times daily. 60 each 2  . gabapentin (NEURONTIN) 300 MG capsule TAKE 1 CAPSULE BY MOUTH THREE TIMES DAILY .MAY  TAKE 2 CAPSULE AT  BEDTIME IF MORE BOTHERSOME AT NIGHT 90 capsule 3  . hydrochlorothiazide (HYDRODIURIL) 25 MG tablet Take 25 mg by mouth daily.    Marland Kitchen ibuprofen (ADVIL) 800 MG tablet TAKE 1 TABLET BY MOUTH EVERY 8 HOURS WITH FOOD AS NEEDED FOR PAIN    . letrozole (FEMARA) 2.5 MG tablet Take 1 tablet by mouth once daily 90 tablet 3  . omeprazole (PRILOSEC) 20 MG capsule Take 1 capsule (20 mg total) by mouth daily. 30 capsule 3  . potassium chloride SA (K-DUR,KLOR-CON) 20 MEQ tablet Take 1 tablet (20 mEq total) by mouth 2 (two) times daily. 30 tablet 1  . tetrahydrozoline 0.05 % ophthalmic solution Place 2 drops into both eyes daily.    . traZODone (DESYREL) 50 MG tablet Take 50 mg by mouth at bedtime.      No results found for this or any previous visit (from the past 48 hour(s)).  No results found.   A comprehensive review of systems was negative.  Height 5\' 9"  (1.753 m), weight (!) 137 kg.  General appearance: alert, cooperative and appears stated age Head: Normocephalic, without obvious abnormality, atraumatic Neck: supple, symmetrical, trachea midline Cardio: regular rate and rhythm Resp: clear to auscultation bilaterally Extremities: Intact sensation and capillary refill all digits but decreased in ring and small of left hand.  +epl/fpl/io.  No wounds.  Pulses: 2+ and symmetric Skin: Skin  color, texture, turgor normal. 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  Plan left ulnar nerve decompression at the wrist.  Non operative and operative treatment options have been discussed with the patient and patient wishes to proceed with operative treatment. Risks, benefits, and alternatives of surgery have been discussed and the patient agrees with the plan of care.   Leanora Cover 01/29/2019, 11:56 AM

## 2019-01-29 NOTE — Discharge Instructions (Addendum)
No Ibuprofen until 9:30pm if needed  [facility] Instructions Hand Surgery  Wound Care: Keep your hand elevated above the level of your heart.  Do not allow it to dangle by your side.  Keep the dressing dry and do not remove it unless your doctor advises you to do so.  He will usually change it at the time of your post-op visit.  Moving your fingers is advised to stimulate circulation but will depend on the site of your surgery.  If you have a splint applied, your doctor will advise you regarding movement.  Activity: Do not drive or operate machinery today.  Rest today and then you may return to your normal activity and work as indicated by your physician.  Diet:  Drink liquids today or eat a light diet.  You may resume a regular diet tomorrow.    General expectations: Pain for two to three days. Fingers may become slightly swollen.  Call your doctor if any of the following occur: Severe pain not relieved by pain medication. Elevated temperature. Dressing soaked with blood. Inability to move fingers. White or bluish color to fingers.   Post Anesthesia Home Care Instructions  Activity: Get plenty of rest for the remainder of the day. A responsible individual must stay with you for 24 hours following the procedure.  For the next 24 hours, DO NOT: -Drive a car -Paediatric nurse -Drink alcoholic beverages -Take any medication unless instructed by your physician -Make any legal decisions or sign important papers.  Meals: Start with liquid foods such as gelatin or soup. Progress to regular foods as tolerated. Avoid greasy, spicy, heavy foods. If nausea and/or vomiting occur, drink only clear liquids until the nausea and/or vomiting subsides. Call your physician if vomiting continues.  Special Instructions/Symptoms: Your throat may feel dry or sore from the anesthesia or the breathing tube placed in your throat during surgery. If this causes discomfort, gargle with warm salt water.  The discomfort should disappear within 24 hours.  If you had a scopolamine patch placed behind your ear for the management of post- operative nausea and/or vomiting:  1. The medication in the patch is effective for 72 hours, after which it should be removed.  Wrap patch in a tissue and discard in the trash. Wash hands thoroughly with soap and water. 2. You may remove the patch earlier than 72 hours if you experience unpleasant side effects which may include dry mouth, dizziness or visual disturbances. 3. Avoid touching the patch. Wash your hands with soap and water after contact with the patch.

## 2019-01-29 NOTE — Op Note (Signed)
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   ASSISTANT: Leverne Humbles, [facility]   ANESTHESIA:  General   INTRAVENOUS FLUIDS:  Per anesthesia flow sheet.   ESTIMATED BLOOD LOSS:  Minimal.   COMPLICATIONS:  None.   SPECIMENS:  none   TOURNIQUET TIME:    Total Tourniquet Time Documented: Forearm (Left) - 35 minutes Total: Forearm (Left) - 35 minutes    DISPOSITION:  Stable to PACU.   INDICATIONS: 48 year old female with numbness and tingling of left ring and small fingers.  Positive nerve conduction studies for compression of the ulnar nerve at the wrist.  She wishes to proceed with operative decompression of the ulnar nerve at the wrist. Risks, benefits and alternatives of surgery were discussed including the risks of blood loss, infection, damage to nerves, vessels, tendons, ligaments, bone for surgery, need for additional surgery, complications with wound healing, continued pain, stiffness.  She voiced understanding of these risks and elected to proceed.  OPERATIVE COURSE:  After being identified preoperatively by myself,  the patient and I agreed on the procedure and site of the procedure.  The surgical site was marked.  Surgical consent had been signed. She was given IV antibiotics as preoperative antibiotic prophylaxis. She was transferred to the operating room and placed on the operating table in supine position with the Left upper extremity on an arm board.  General anesthesia was induced by the anesthesiologist.  Left upper extremity was prepped and draped in normal sterile orthopedic  fashion.  A surgical pause was performed between the surgeons, anesthesia, and operating room staff and all were in agreement as to the patient, procedure, and site of procedure.  Tourniquet at the proximal aspect of the forearm was inflated to 250 mmHg after exsanguination of the arm with an Esmarch bandage.    Incision was made over the ulnar nerve in the palm and carried into the subcutaneous tissues by spreading technique.  This was extended proximal to the wrist crease to aid in identification of the ulnar nerve.  Bipolar electrocautery was used to obtain hemostasis.  The ulnar nerve was identified proximally.  Was then carefully decompressed distally.  There was a thick band of fascia and muscle over the nerve at and just distal to the Pisoform.  This was carefully decompressed.  The nerve was decompressed into the mid palm.  The motor branch was identified.  It was then carefully decompressed as well.  There was a fascial band going over top of the motor branch as well.  This was released.  The nerve was then inspected.  There were no noted remaining points of compression.  The wound was copiously irrigated with sterile saline and closed with 4-0 nylon in a horizontal mattress fashion.  It was injected with quarter percent plain Marcaine to aid in postoperative analgesia.  Was then dressed with sterile Xeroform 4 x 4's and ABD and wrapped with Kerlix and an Ace bandage.  The tourniquet was deflated at 35 minutes.  Fingertips were pink with brisk capillary refill after deflation of tourniquet.  The operative  drapes were  broken down.  The patient was awoken from anesthesia safely.  She was transferred back to the stretcher and taken to PACU in stable condition.  I will see her back in the office in 1 week for postoperative followup.  I will give her a prescription for Norco 5/325 1-2 tabs PO q6 hours prn pain, dispense # 20.   Leanora Cover, MD Electronically signed, 01/29/19

## 2019-02-01 ENCOUNTER — Ambulatory Visit: Payer: Medicaid Other

## 2019-02-01 ENCOUNTER — Encounter (HOSPITAL_BASED_OUTPATIENT_CLINIC_OR_DEPARTMENT_OTHER): Payer: Self-pay | Admitting: Orthopedic Surgery

## 2019-02-01 NOTE — Anesthesia Postprocedure Evaluation (Signed)
Anesthesia Post Note  Patient: Michelle Mcmillan  Procedure(s) Performed: LEFT ULNAR NERVE DECOMPRESSION/TRANSPOSITION (Left Wrist)     Patient location during evaluation: PACU Anesthesia Type: General Level of consciousness: awake and alert Pain management: pain level controlled Vital Signs Assessment: post-procedure vital signs reviewed and stable Respiratory status: spontaneous breathing, nonlabored ventilation, respiratory function stable and patient connected to nasal cannula oxygen Cardiovascular status: blood pressure returned to baseline and stable Postop Assessment: no apparent nausea or vomiting Anesthetic complications: no    Last Vitals:  Vitals:   01/29/19 1530 01/29/19 1551  BP: (!) 141/92 (!) 145/94  Pulse: 64 66  Resp: 14 16  Temp:  37.1 C  SpO2: 100% 100%    Last Pain:  Vitals:   01/29/19 1551  TempSrc:   PainSc: 2                  Montez Hageman

## 2019-02-02 ENCOUNTER — Other Ambulatory Visit: Payer: Self-pay

## 2019-02-02 ENCOUNTER — Ambulatory Visit (INDEPENDENT_AMBULATORY_CARE_PROVIDER_SITE_OTHER): Payer: Medicaid Other | Admitting: Plastic Surgery

## 2019-02-02 ENCOUNTER — Encounter: Payer: Self-pay | Admitting: Plastic Surgery

## 2019-02-02 DIAGNOSIS — Z9013 Acquired absence of bilateral breasts and nipples: Secondary | ICD-10-CM

## 2019-02-02 DIAGNOSIS — Z9889 Other specified postprocedural states: Secondary | ICD-10-CM

## 2019-02-02 NOTE — Progress Notes (Signed)
   Subjective:    Patient ID: Michelle Mcmillan, female    DOB: April 15, 1970, 48 y.o.   MRN: [id number]  The patient is a 48 yrs old female joining me by televisit.  She underwent bilateral breast reconstruction.  In July 2020 she had her exchange of expanders to implants.  She had a CT due to a nodule at the sternal area and it was concerning.  She is scheduled for a PET scan tomorrow.  She underwent surgery with Dr. Fredna Dow this week and is doing well in recovery.  She is interested in an abdominoplasty.       Review of Systems  Constitutional: Negative.   HENT: Negative.   Eyes: Negative.   Respiratory: Negative.   Cardiovascular: Negative.   Endocrine: Negative.   Genitourinary: Negative.   Musculoskeletal: Negative.   Hematological: Negative.   Psychiatric/Behavioral: Negative.        Objective:   Physical Exam Neurological:     Mental Status: She is alert.        Assessment & Plan:     ICD-10-CM   1. S/P mastectomy, bilateral  Z90.13   2. S/P breast reconstruction, bilateral  Z98.890     The patient gave consent to have this visit done by telemedicine / virtual visit.  This is also consent for access the chart and treat the patient via this visit. The patient is located at home.  I, the provider, am at the office.  We spent 5 minutes together for the visit.   I recommend waiting until she has the hand surgery complete and the PET scan before planning on any elective surgery.  She is in agreement.  I will plan to talk to her at the end of the week once we have the PET results.

## 2019-02-03 ENCOUNTER — Other Ambulatory Visit: Payer: Self-pay

## 2019-02-03 ENCOUNTER — Ambulatory Visit
Admission: RE | Admit: 2019-02-03 | Discharge: 2019-02-03 | Disposition: A | Discharge status: Home / Self Care | Payer: Medicaid Other | Source: Ambulatory Visit | Attending: Oncology | Admitting: Oncology

## 2019-02-03 DIAGNOSIS — R222 Localized swelling, mass and lump, trunk: Secondary | ICD-10-CM

## 2019-02-03 DIAGNOSIS — Z9013 Acquired absence of bilateral breasts and nipples: Secondary | ICD-10-CM | POA: Diagnosis not present

## 2019-02-03 LAB — GLUCOSE, CAPILLARY: Glucose-Capillary: 86 mg/dL (ref 70–99)

## 2019-02-03 MED ORDER — FLUDEOXYGLUCOSE F - 18 (FDG) INJECTION
16.0000 | Freq: Once | INTRAVENOUS | Status: AC | PRN
  Administered 2019-02-03: 16.35 via INTRAVENOUS

## 2019-02-11 ENCOUNTER — Encounter: Payer: Self-pay | Admitting: Emergency Medicine

## 2019-02-11 ENCOUNTER — Telehealth: Payer: Self-pay

## 2019-02-11 NOTE — Telephone Encounter (Signed)
PET scan is negative without evidence of recurrent or progressive disease.  I thought her plastic surgeon would discuss the results, but I believe the PET scan occurred after that appointment.  No intervention or biopsy is needed at this time.  Please instruct patient to keep follow-up appointment scheduled for June 23, 2019.  If she wants to be evaluated sooner to further discuss, we can arrange a video visit in the next several weeks.

## 2019-02-11 NOTE — Telephone Encounter (Signed)
Patient calling for PET scan results.

## 2019-02-11 NOTE — Telephone Encounter (Signed)
I relayed this message to the patient

## 2019-02-12 ENCOUNTER — Other Ambulatory Visit: Payer: Self-pay

## 2019-02-12 ENCOUNTER — Encounter: Payer: Self-pay | Admitting: Surgical

## 2019-02-12 ENCOUNTER — Ambulatory Visit (INDEPENDENT_AMBULATORY_CARE_PROVIDER_SITE_OTHER): Payer: Medicaid Other | Admitting: Surgical

## 2019-02-12 DIAGNOSIS — Z9889 Other specified postprocedural states: Secondary | ICD-10-CM

## 2019-02-12 DIAGNOSIS — Z9013 Acquired absence of bilateral breasts and nipples: Secondary | ICD-10-CM

## 2019-02-12 NOTE — Progress Notes (Signed)
Michelle Mcmillan is a 48 year old female who presented to the clinic today with questions about her recent PET scan results.  The results of her PET scan were provided to her via print out of the radiologist report.  According to the report there is no finding suspicious for recurrent or metastatic disease.  She is very pleased to hear this.   She would like to move forward with bilateral breast lipofilling and liposuction.  She has not had any recent fevers, chills, nausea, vomiting.  She is overall feeling really well and is in good spirits today.

## 2019-03-02 ENCOUNTER — Ambulatory Visit (INDEPENDENT_AMBULATORY_CARE_PROVIDER_SITE_OTHER): Payer: Medicaid Other | Admitting: Plastic Surgery

## 2019-03-02 ENCOUNTER — Other Ambulatory Visit: Payer: Self-pay

## 2019-03-02 ENCOUNTER — Encounter: Payer: Self-pay | Admitting: Plastic Surgery

## 2019-03-02 DIAGNOSIS — Z9889 Other specified postprocedural states: Secondary | ICD-10-CM

## 2019-03-02 NOTE — Progress Notes (Signed)
   Subjective:    Patient ID: Michelle Mcmillan, female    DOB: [birth year], 48 y.o.   MRN: [id number]  The patient is a 48 old female telemetry visit.  She is doing much better.  She got the results of her scans.  And there were some questions we talked about that today.  She is also going to talk with her primary care doc about follow-up.  Her breasts are doing well no signs of infection.  Overall she is feeling much better.  She still has a little bit of numbness in her fingers but her small finger has markedly improved from her hand surgery.     Review of Systems  Constitutional: Negative.  Negative for activity change.  HENT: Negative.   Respiratory: Negative.   Cardiovascular: Negative.   Musculoskeletal: Negative.   Psychiatric/Behavioral: Negative.        Objective:   Physical Exam  Virtual visit      Assessment & Plan:     ICD-10-CM   1. S/P breast reconstruction, bilateral  Z98.890     Plan to follow-up at the first of the year.  Call with any questions or concerns. The patient gave consent to have this visit done by telemedicine / virtual visit.  This is also consent for access the chart and treat the patient via this visit. We spent 10 minutes on the phone together.

## 2019-03-16 DIAGNOSIS — G8929 Other chronic pain: Secondary | ICD-10-CM | POA: Insufficient documentation

## 2019-04-13 ENCOUNTER — Encounter: Payer: Self-pay | Admitting: Plastic Surgery

## 2019-04-13 ENCOUNTER — Ambulatory Visit (INDEPENDENT_AMBULATORY_CARE_PROVIDER_SITE_OTHER): Payer: Medicaid Other | Admitting: Plastic Surgery

## 2019-04-13 ENCOUNTER — Other Ambulatory Visit: Payer: Self-pay

## 2019-04-13 VITALS — BP 138/95 | HR 72 | Temp 97.5°F | Ht 69.0 in | Wt 311.4 lb

## 2019-04-13 DIAGNOSIS — Z170 Estrogen receptor positive status [ER+]: Secondary | ICD-10-CM

## 2019-04-13 DIAGNOSIS — Z9889 Other specified postprocedural states: Secondary | ICD-10-CM

## 2019-04-13 DIAGNOSIS — Z9013 Acquired absence of bilateral breasts and nipples: Secondary | ICD-10-CM

## 2019-04-13 DIAGNOSIS — C50411 Malignant neoplasm of upper-outer quadrant of right female breast: Secondary | ICD-10-CM

## 2019-04-13 MED ORDER — CEPHALEXIN 500 MG PO CAPS: 500.0000 mg | ORAL_CAPSULE | Freq: Four times a day (QID) | ORAL | 0 refills | Status: AC

## 2019-04-13 MED ORDER — HYDROCODONE-ACETAMINOPHEN 5-325 MG PO TABS: 1.0000 | ORAL_TABLET | Freq: Three times a day (TID) | ORAL | 0 refills | Status: DC | PRN

## 2019-04-13 NOTE — H&P (View-Only) (Signed)
No diagnosis found.    Patient ID: Michelle Mcmillan, female    DOB: [birth year], 49 y.o.   MRN: [id number]   History of Present Illness: Michelle Mcmillan is a 49 y.o.  female  with a history of bilateral breast reconstruction after bilateral mastectomies due to a history of breast cancer.  She presents with her husband for preoperative evaluation for upcoming procedure, bilateral excision of scar contractures and lipofilling, scheduled for 04/22/2019 with [name].  Today she reports she is feeling very well.  Expresses desire to move forward with excision of scar contractures and life of failing.  She denies a personal history of blood clots, her grandmother has a history of blood clots.  She denies any recent trauma, current swelling of legs, Hx of MI, Stroke, lung disease, or IBD. Had wrist surgery on 01/29/2019. She does not smoke.  The patient has not had problems with anesthesia.  Past Medical History: Allergies: Allergies  Allergen Reactions  . Nitroglycerin Other (See Comments)    Headache, n/v.    Current Medications:  Current Outpatient Medications:  .  buPROPion (WELLBUTRIN XL) 150 MG 24 hr tablet, Take 150 mg by mouth daily., Disp: , Rfl:  .  diclofenac sodium (VOLTAREN) 1 % GEL, Apply 4 g topically 4 (four) times daily., Disp: , Rfl:  .  escitalopram (LEXAPRO) 20 MG tablet, TAKE 1 TABLET BY MOUTH ONCE DAILY FOR MOOD, Disp: , Rfl:  .  Ferrous Sulfate (IRON) 325 (65 Fe) MG TABS, Take 1 tablet (325 mg total) by mouth 2 (two) times daily., Disp: 60 each, Rfl: 2 .  gabapentin (NEURONTIN) 300 MG capsule, TAKE 1 CAPSULE BY MOUTH THREE TIMES DAILY .MAY  TAKE 2 CAPSULE AT  BEDTIME IF MORE BOTHERSOME AT NIGHT, Disp: 90 capsule, Rfl: 3 .  HYDROcodone-acetaminophen (NORCO) 5-325 MG tablet, 1-2 tabs po q6 hours prn pain, Disp: 20 tablet, Rfl: 0 .  ibuprofen (ADVIL) 800 MG tablet, TAKE 1 TABLET BY MOUTH EVERY 8 HOURS WITH FOOD AS NEEDED FOR PAIN, Disp: , Rfl:  .  letrozole (FEMARA) 2.5  MG tablet, Take 1 tablet by mouth once daily, Disp: 90 tablet, Rfl: 3 .  omeprazole (PRILOSEC) 20 MG capsule, Take 1 capsule (20 mg total) by mouth daily., Disp: 30 capsule, Rfl: 3 .  potassium chloride SA (K-DUR,KLOR-CON) 20 MEQ tablet, Take 1 tablet (20 mEq total) by mouth 2 (two) times daily., Disp: 30 tablet, Rfl: 1 .  tetrahydrozoline 0.05 % ophthalmic solution, Place 2 drops into both eyes daily., Disp: , Rfl:  .  traZODone (DESYREL) 50 MG tablet, Take 50 mg by mouth at bedtime., Disp: , Rfl:   Past Medical Problems: Past Medical History:  Diagnosis Date  . Anemia    H/O  . Anemia   . Arthritis    RIGHT KNEE  . Breast cancer ([place]) 2017   right breast  . Cancer ([place])    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)  . Dyspnea   . GERD (gastroesophageal reflux disease)    NO MEDS  . Headache    migraines  . Hypertension   . Last menstrual period (LMP) > 10 days ago 08/2015  . Lower extremity edema   . Neuropathy   . Personal history of radiation therapy     Past Surgical History: Past Surgical History:  Procedure Laterality Date  . BREAST BIOPSY Right 05/08/2017   invasive mamm. ca  . BREAST EXCISIONAL BIOPSY Right 06/15/15   breast cancer  . BREAST LUMPECTOMY WITH NEEDLE LOCALIZATION  Right 06/15/2015   Procedure: BREAST LUMPECTOMY WITH NEEDLE LOCALIZATION;  Surgeon: Hubbard Robinson, MD;  Location: [facility];  Service: General;  Laterality: Right;  . BREAST RECONSTRUCTION WITH PLACEMENT OF TISSUE EXPANDER AND FLEX HD (ACELLULAR HYDRATED DERMIS) Left 02/09/2018   Procedure: BREAST RECONSTRUCTION WITH PLACEMENT OF TISSUE EXPANDER AND FLEX HD (ACELLULAR HYDRATED DERMIS);  Surgeon: Wallace Going, DO;  Location: [facility];  Service: Plastics;  Laterality: Left;  . BREAST SURGERY    . CESAREAN SECTION  1988   x1  . COLONOSCOPY WITH PROPOFOL N/A 01/23/2017   Procedure: COLONOSCOPY WITH PROPOFOL;  Surgeon: Lin Landsman, MD;  Location: [facility];  Service: Gastroenterology;   Laterality: N/A;  . ESOPHAGOGASTRODUODENOSCOPY (EGD) WITH PROPOFOL N/A 01/23/2017   Procedure: ESOPHAGOGASTRODUODENOSCOPY (EGD) WITH PROPOFOL;  Surgeon: Lin Landsman, MD;  Location: [facility]COPY;  Service: Gastroenterology;  Laterality: N/A;  . LATISSIMUS FLAP TO BREAST Right 06/01/2018   Procedure: RIGHT BREAST RECONSTRUCTION WITH LATISSIMUS MYOCUTANEOUS FLAP;  Surgeon: Wallace Going, DO;  Location: [place];  Service: Plastics;  Laterality: Right;  . LIPOSUCTION WITH LIPOFILLING Bilateral 10/15/2018   Procedure: LIPOSUCTION TO BILATERAL BREASTS;  Surgeon: Wallace Going, DO;  Location: [place];  Service: Plastics;  Laterality: Bilateral;  . MASS EXCISION N/A 05/20/2016   Procedure: EXCISION CHEST WALL MASS;  Surgeon: Florene Glen, MD;  Location: [facility];  Service: General;  Laterality: N/A;  . RECONSTRUCTION BREAST W/ LATISSIMUS DORSI FLAP Right 06/01/2018  . REMOVAL OF BILATERAL TISSUE EXPANDERS WITH PLACEMENT OF BILATERAL BREAST IMPLANTS Bilateral 10/15/2018   Procedure: REMOVAL OF BILATERAL TISSUE EXPANDERS WITH PLACEMENT OF BILATERAL BREAST IMPLANTS;  Surgeon: Wallace Going, DO;  Location: [place];  Service: Plastics;  Laterality: Bilateral;  . SENTINEL NODE BIOPSY Right 06/15/2015   Procedure: SENTINEL NODE BIOPSY;  Surgeon: Hubbard Robinson, MD;  Location: [facility];  Service: General;  Laterality: Right;  . TOTAL MASTECTOMY Right 05/22/2017   Procedure: TOTAL MASTECTOMY;  Surgeon: Florene Glen, MD;  Location: [facility];  Service: General;  Laterality: Right;  . TOTAL MASTECTOMY Left 02/09/2018   Procedure: LEFT PROPHYLACTIC  MASTECTOMY;  Surgeon: Jovita Kussmaul, MD;  Location: [facility];  Service: General;  Laterality: Left;  . TUBAL LIGATION  2004  . ULNAR NERVE TRANSPOSITION Left 01/29/2019   Procedure: LEFT ULNAR NERVE DECOMPRESSION/TRANSPOSITION;  Surgeon: Leanora Cover, MD;  Location: [place];  Service:  Orthopedics;  Laterality: Left;    Social History: Social History   Socioeconomic History  . Marital status: Married    Spouse name: Not on file  . Number of children: Not on file  . Years of education: Not on file  . Highest education level: Not on file  Occupational History  . Not on file  Tobacco Use  . Smoking status: Never Smoker  . Smokeless tobacco: Never Used  Substance and Sexual Activity  . Alcohol use: Not Currently    Comment: WINE ONCE MONTH  . Drug use: No  . Sexual activity: Not on file  Other Topics Concern  . Not on file  Social History Narrative  . Not on file   Social Determinants of Health   Financial Resource Strain:   . Difficulty of Paying Living Expenses: Not on file  Food Insecurity:   . Worried About Charity fundraiser in the Last Year: Not on file  . Ran Out of Food in the Last Year: Not on file  Transportation Needs:   . Film/video editor (Medical): Not on file  . Lack of Transportation (Non-Medical): Not on file  Physical Activity:   . Days of Exercise per Week: Not on file  . Minutes of Exercise per Session: Not on file  Stress:   . Feeling of Stress : Not on file  Social Connections:   . Frequency of Communication with Friends and Family: Not on file  . Frequency of Social Gatherings with Friends and Family: Not on file  . Attends Religious Services: Not on file  . Active Member of Clubs or Organizations: Not on file  . Attends Archivist Meetings: Not on file  . Marital Status: Not on file  Intimate Partner Violence:   . Fear of Current or Ex-Partner: Not on file  . Emotionally Abused: Not on file  . Physically Abused: Not on file  . Sexually Abused: Not on file    Family History: Family History  Problem Relation Age of Onset  . Hypertension Mother   . Hypertension Father   . Hypertension Sister   . Breast cancer Neg Hx     Review of Systems: Review of Systems  Constitutional: Negative for chills and  fever.  HENT: Negative for congestion and sore throat.   Respiratory: Negative for cough and shortness of breath.   Cardiovascular: Negative for chest pain.  Gastrointestinal: Negative for abdominal pain, nausea and vomiting.  Skin: Negative for itching and rash.  Neurological:       Some numbness and pain in the fingers of her left hand.    Physical Exam: Vital Signs BP (!) 138/95 (BP Location: Left Arm, Patient Position: Sitting, Cuff Size: Large)   Pulse 72   Temp (!) 97.5 F (36.4 C) (Temporal)   Ht 5\' 9"  (1.753 m)   Wt (!) 311 lb 6.4 oz (141.3 kg)   SpO2 99%   BMI 45.99 kg/m  Physical Exam Constitutional:      Appearance: Normal appearance. She is obese.  HENT:     Head: Normocephalic and atraumatic.  Eyes:     Extraocular Movements: Extraocular movements intact.  Cardiovascular:     Rate and Rhythm: Normal rate and regular rhythm.     Pulses: Normal pulses.     Heart sounds: Normal heart sounds.  Pulmonary:     Effort: Pulmonary effort is normal.     Breath sounds: Normal breath sounds. No wheezing, rhonchi or rales.  Chest:       Comments: Incisions are well healed. Right side latissimus flap. Scar contracture present on medial aspect of both breasts.  Abdominal:     General: Bowel sounds are normal.     Palpations: Abdomen is soft.  Musculoskeletal:        General: No swelling. Normal range of motion.     Cervical back: Normal range of motion.  Skin:    General: Skin is warm and dry.     Coloration: Skin is not pale.     Findings: No erythema or rash.  Neurological:     General: No focal deficit present.     Mental Status: She is alert and oriented to person, place, and time.  Psychiatric:        Mood and Affect: Mood normal.        Behavior: Behavior normal.        Thought Content: Thought content normal.        Judgment: Judgment normal.     Assessment/Plan:  She  is doing well. All incisions are healed, c/d/i. Scar contracture present medially  on both breasts. She denies any recent illness. Covid test scheduled. She verbalized understanding of need to self quarantine after her test until surgery.   Mrs. Pizano scheduled for bilateral excision of scar contracture with lipofilling with [name]. The consent was obtained with risks and complications reviewed which included bleeding, pain, scar, infection and the risk of anesthesia.  The patients questions were answered to the patients expressed satisfaction.  Caprini Risk:  High Risks include 49 year old obese female, planned length of procedure, hx of breast cancer, family history of blood clots (grandmother).  Recommend mechanical and pharmocological prophylaxis.   Rx sent to pharmacy for Keflex and Norco.   Photos in chart from 01/19/2019.  The [place] was signed into law in 2016 which includes the topic of electronic health records.  This provides immediate access to information in MyChart.  This includes consultation notes, operative notes, office notes, lab results and pathology reports.  If you have any questions about what you read please let us know at your next visit or call us at the office.  We are right here with you.   Electronically signed by: Threasa Heads, PA-C 04/13/2019 4:25 PM

## 2019-04-13 NOTE — Progress Notes (Signed)
No diagnosis found.    Patient ID: Michelle Mcmillan, female    DOB: Feb 19, 1971, 49 y.o.   MRN: [id number]   History of Present Illness: Michelle Mcmillan is a 49 y.o.  female  with a history of bilateral breast reconstruction after bilateral mastectomies due to a history of breast cancer.  She presents with her husband for preoperative evaluation for upcoming procedure, bilateral excision of scar contractures and lipofilling, scheduled for 04/22/2019 with [name].  Today she reports she is feeling very well.  Expresses desire to move forward with excision of scar contractures and life of failing.  She denies a personal history of blood clots, her grandmother has a history of blood clots.  She denies any recent trauma, current swelling of legs, Hx of MI, Stroke, lung disease, or IBD. Had wrist surgery on 01/29/2019. She does not smoke.  The patient has not had problems with anesthesia.  Past Medical History: Allergies: Allergies  Allergen Reactions  . Nitroglycerin Other (See Comments)    Headache, n/v.    Current Medications:  Current Outpatient Medications:  .  buPROPion (WELLBUTRIN XL) 150 MG 24 hr tablet, Take 150 mg by mouth daily., Disp: , Rfl:  .  diclofenac sodium (VOLTAREN) 1 % GEL, Apply 4 g topically 4 (four) times daily., Disp: , Rfl:  .  escitalopram (LEXAPRO) 20 MG tablet, TAKE 1 TABLET BY MOUTH ONCE DAILY FOR MOOD, Disp: , Rfl:  .  Ferrous Sulfate (IRON) 325 (65 Fe) MG TABS, Take 1 tablet (325 mg total) by mouth 2 (two) times daily., Disp: 60 each, Rfl: 2 .  gabapentin (NEURONTIN) 300 MG capsule, TAKE 1 CAPSULE BY MOUTH THREE TIMES DAILY .MAY  TAKE 2 CAPSULE AT  BEDTIME IF MORE BOTHERSOME AT NIGHT, Disp: 90 capsule, Rfl: 3 .  HYDROcodone-acetaminophen (NORCO) 5-325 MG tablet, 1-2 tabs po q6 hours prn pain, Disp: 20 tablet, Rfl: 0 .  ibuprofen (ADVIL) 800 MG tablet, TAKE 1 TABLET BY MOUTH EVERY 8 HOURS WITH FOOD AS NEEDED FOR PAIN, Disp: , Rfl:  .  letrozole (FEMARA) 2.5  MG tablet, Take 1 tablet by mouth once daily, Disp: 90 tablet, Rfl: 3 .  omeprazole (PRILOSEC) 20 MG capsule, Take 1 capsule (20 mg total) by mouth daily., Disp: 30 capsule, Rfl: 3 .  potassium chloride SA (K-DUR,KLOR-CON) 20 MEQ tablet, Take 1 tablet (20 mEq total) by mouth 2 (two) times daily., Disp: 30 tablet, Rfl: 1 .  tetrahydrozoline 0.05 % ophthalmic solution, Place 2 drops into both eyes daily., Disp: , Rfl:  .  traZODone (DESYREL) 50 MG tablet, Take 50 mg by mouth at bedtime., Disp: , Rfl:   Past Medical Problems: Past Medical History:  Diagnosis Date  . Anemia    H/O  . Anemia   . Arthritis    RIGHT KNEE  . Breast cancer ([place]) 2017   right breast  . Cancer ([place])    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)  . Dyspnea   . GERD (gastroesophageal reflux disease)    NO MEDS  . Headache    migraines  . Hypertension   . Last menstrual period (LMP) > 10 days ago 08/2015  . Lower extremity edema   . Neuropathy   . Personal history of radiation therapy     Past Surgical History: Past Surgical History:  Procedure Laterality Date  . BREAST BIOPSY Right 05/08/2017   invasive mamm. ca  . BREAST EXCISIONAL BIOPSY Right 06/15/15   breast cancer  . BREAST LUMPECTOMY WITH NEEDLE LOCALIZATION  Right 06/15/2015   Procedure: BREAST LUMPECTOMY WITH NEEDLE LOCALIZATION;  Surgeon: Hubbard Robinson, MD;  Location: [facility];  Service: General;  Laterality: Right;  . BREAST RECONSTRUCTION WITH PLACEMENT OF TISSUE EXPANDER AND FLEX HD (ACELLULAR HYDRATED DERMIS) Left 02/09/2018   Procedure: BREAST RECONSTRUCTION WITH PLACEMENT OF TISSUE EXPANDER AND FLEX HD (ACELLULAR HYDRATED DERMIS);  Surgeon: Wallace Going, DO;  Location: [facility];  Service: Plastics;  Laterality: Left;  . BREAST SURGERY    . CESAREAN SECTION  1988   x1  . COLONOSCOPY WITH PROPOFOL N/A 01/23/2017   Procedure: COLONOSCOPY WITH PROPOFOL;  Surgeon: Lin Landsman, MD;  Location: [facility] ENDOSCOPY;  Service: Gastroenterology;   Laterality: N/A;  . ESOPHAGOGASTRODUODENOSCOPY (EGD) WITH PROPOFOL N/A 01/23/2017   Procedure: ESOPHAGOGASTRODUODENOSCOPY (EGD) WITH PROPOFOL;  Surgeon: Lin Landsman, MD;  Location: [facility];  Service: Gastroenterology;  Laterality: N/A;  . LATISSIMUS FLAP TO BREAST Right 06/01/2018   Procedure: RIGHT BREAST RECONSTRUCTION WITH LATISSIMUS MYOCUTANEOUS FLAP;  Surgeon: Wallace Going, DO;  Location: [place];  Service: Plastics;  Laterality: Right;  . LIPOSUCTION WITH LIPOFILLING Bilateral 10/15/2018   Procedure: LIPOSUCTION TO BILATERAL BREASTS;  Surgeon: Wallace Going, DO;  Location: [place];  Service: Plastics;  Laterality: Bilateral;  . MASS EXCISION N/A 05/20/2016   Procedure: EXCISION CHEST WALL MASS;  Surgeon: Florene Glen, MD;  Location: [facility];  Service: General;  Laterality: N/A;  . RECONSTRUCTION BREAST W/ LATISSIMUS DORSI FLAP Right 06/01/2018  . REMOVAL OF BILATERAL TISSUE EXPANDERS WITH PLACEMENT OF BILATERAL BREAST IMPLANTS Bilateral 10/15/2018   Procedure: REMOVAL OF BILATERAL TISSUE EXPANDERS WITH PLACEMENT OF BILATERAL BREAST IMPLANTS;  Surgeon: Wallace Going, DO;  Location: [place];  Service: Plastics;  Laterality: Bilateral;  . SENTINEL NODE BIOPSY Right 06/15/2015   Procedure: SENTINEL NODE BIOPSY;  Surgeon: Hubbard Robinson, MD;  Location: [facility];  Service: General;  Laterality: Right;  . TOTAL MASTECTOMY Right 05/22/2017   Procedure: TOTAL MASTECTOMY;  Surgeon: Florene Glen, MD;  Location: [facility];  Service: General;  Laterality: Right;  . TOTAL MASTECTOMY Left 02/09/2018   Procedure: LEFT PROPHYLACTIC  MASTECTOMY;  Surgeon: Jovita Kussmaul, MD;  Location: [facility];  Service: General;  Laterality: Left;  . TUBAL LIGATION  2004  . ULNAR NERVE TRANSPOSITION Left 01/29/2019   Procedure: LEFT ULNAR NERVE DECOMPRESSION/TRANSPOSITION;  Surgeon: Leanora Cover, MD;  Location: [place];  Service:  Orthopedics;  Laterality: Left;    Social History: Social History   Socioeconomic History  . Marital status: Married    Spouse name: Not on file  . Number of children: Not on file  . Years of education: Not on file  . Highest education level: Not on file  Occupational History  . Not on file  Tobacco Use  . Smoking status: Never Smoker  . Smokeless tobacco: Never Used  Substance and Sexual Activity  . Alcohol use: Not Currently    Comment: WINE ONCE MONTH  . Drug use: No  . Sexual activity: Not on file  Other Topics Concern  . Not on file  Social History Narrative  . Not on file   Social Determinants of Health   Financial Resource Strain:   . Difficulty of Paying Living Expenses: Not on file  Food Insecurity:   . Worried About Charity fundraiser in the Last Year: Not on file  . Ran Out of Food in the Last Year: Not on file  Transportation Needs:   . Film/video editor (Medical): Not on file  . Lack of Transportation (Non-Medical): Not on file  Physical Activity:   . Days of Exercise per Week: Not on file  . Minutes of Exercise per Session: Not on file  Stress:   . Feeling of Stress : Not on file  Social Connections:   . Frequency of Communication with Friends and Family: Not on file  . Frequency of Social Gatherings with Friends and Family: Not on file  . Attends Religious Services: Not on file  . Active Member of Clubs or Organizations: Not on file  . Attends Archivist Meetings: Not on file  . Marital Status: Not on file  Intimate Partner Violence:   . Fear of Current or Ex-Partner: Not on file  . Emotionally Abused: Not on file  . Physically Abused: Not on file  . Sexually Abused: Not on file    Family History: Family History  Problem Relation Age of Onset  . Hypertension Mother   . Hypertension Father   . Hypertension Sister   . Breast cancer Neg Hx     Review of Systems: Review of Systems  Constitutional: Negative for chills and  fever.  HENT: Negative for congestion and sore throat.   Respiratory: Negative for cough and shortness of breath.   Cardiovascular: Negative for chest pain.  Gastrointestinal: Negative for abdominal pain, nausea and vomiting.  Skin: Negative for itching and rash.  Neurological:       Some numbness and pain in the fingers of her left hand.    Physical Exam: Vital Signs BP (!) 138/95 (BP Location: Left Arm, Patient Position: Sitting, Cuff Size: Large)   Pulse 72   Temp (!) 97.5 F (36.4 C) (Temporal)   Ht 5\' 9"  (1.753 m)   Wt (!) 311 lb 6.4 oz (141.3 kg)   SpO2 99%   BMI 45.99 kg/m  Physical Exam Constitutional:      Appearance: Normal appearance. She is obese.  HENT:     Head: Normocephalic and atraumatic.  Eyes:     Extraocular Movements: Extraocular movements intact.  Cardiovascular:     Rate and Rhythm: Normal rate and regular rhythm.     Pulses: Normal pulses.     Heart sounds: Normal heart sounds.  Pulmonary:     Effort: Pulmonary effort is normal.     Breath sounds: Normal breath sounds. No wheezing, rhonchi or rales.  Chest:       Comments: Incisions are well healed. Right side latissimus flap. Scar contracture present on medial aspect of both breasts.  Abdominal:     General: Bowel sounds are normal.     Palpations: Abdomen is soft.  Musculoskeletal:        General: No swelling. Normal range of motion.     Cervical back: Normal range of motion.  Skin:    General: Skin is warm and dry.     Coloration: Skin is not pale.     Findings: No erythema or rash.  Neurological:     General: No focal deficit present.     Mental Status: She is alert and oriented to person, place, and time.  Psychiatric:        Mood and Affect: Mood normal.        Behavior: Behavior normal.        Thought Content: Thought content normal.        Judgment: Judgment normal.     Assessment/Plan:  She  is doing well. All incisions are healed, c/d/i. Scar contracture present medially  on both breasts. She denies any recent illness. Covid test scheduled. She verbalized understanding of need to self quarantine after her test until surgery.   Mrs. Hineman scheduled for bilateral excision of scar contracture with lipofilling with [name]. The consent was obtained with risks and complications reviewed which included bleeding, pain, scar, infection and the risk of anesthesia.  The patients questions were answered to the patients expressed satisfaction.  Caprini Risk:  High Risks include 49 year old obese female, planned length of procedure, hx of breast cancer, family history of blood clots (grandmother).  Recommend mechanical and pharmocological prophylaxis.   Rx sent to pharmacy for Keflex and Norco.   Photos in chart from 01/19/2019.  The [place] was signed into law in 2016 which includes the topic of electronic health records.  This provides immediate access to information in MyChart.  This includes consultation notes, operative notes, office notes, lab results and pathology reports.  If you have any questions about what you read please let us know at your next visit or call us at the office.  We are right here with you.   Electronically signed by: Threasa Heads, PA-C 04/13/2019 4:25 PM

## 2019-04-14 ENCOUNTER — Encounter (HOSPITAL_BASED_OUTPATIENT_CLINIC_OR_DEPARTMENT_OTHER): Payer: Self-pay | Admitting: Plastic Surgery

## 2019-04-14 ENCOUNTER — Other Ambulatory Visit: Payer: Self-pay

## 2019-04-19 ENCOUNTER — Other Ambulatory Visit: Payer: Medicaid Other

## 2019-04-19 ENCOUNTER — Other Ambulatory Visit (HOSPITAL_COMMUNITY): Payer: Medicaid Other

## 2019-04-19 ENCOUNTER — Other Ambulatory Visit
Admission: RE | Admit: 2019-04-19 | Discharge: 2019-04-19 | Disposition: A | Discharge status: Home / Self Care | Payer: Medicaid Other | Source: Ambulatory Visit | Attending: Plastic Surgery | Admitting: Plastic Surgery

## 2019-04-19 DIAGNOSIS — Z01812 Encounter for preprocedural laboratory examination: Secondary | ICD-10-CM | POA: Insufficient documentation

## 2019-04-19 DIAGNOSIS — Z20822 Contact with and (suspected) exposure to covid-19: Secondary | ICD-10-CM | POA: Diagnosis not present

## 2019-04-19 LAB — SARS CORONAVIRUS 2 (TAT 6-24 HRS): SARS Coronavirus 2: NEGATIVE

## 2019-04-21 NOTE — Anesthesia Preprocedure Evaluation (Addendum)
Anesthesia Evaluation  Patient identified by MRN, date of birth, ID band Patient awake    Reviewed: Allergy & Precautions, NPO status , Patient's Chart, lab work & pertinent test results  History of Anesthesia Complications Negative for: history of anesthetic complications  Airway Mallampati: II  TM Distance: >3 FB Neck ROM: Full    Dental  (+) Missing,    Pulmonary neg pulmonary ROS,    Pulmonary exam normal        Cardiovascular hypertension, Normal cardiovascular exam     Neuro/Psych negative neurological ROS  negative psych ROS   GI/Hepatic Neg liver ROS, GERD  Medicated and Controlled,  Endo/Other  Morbid obesity (BMI 45)  Renal/GU negative Renal ROS  negative genitourinary   Musculoskeletal negative musculoskeletal ROS (+)   Abdominal   Peds  Hematology negative hematology ROS (+)   Anesthesia Other Findings Hx of breast ca  Reproductive/Obstetrics negative OB ROS                            Anesthesia Physical Anesthesia Plan  ASA: III  Anesthesia Plan: General   Post-op Pain Management:    Induction: Intravenous  PONV Risk Score and Plan: 4 or greater and Treatment may vary due to age or medical condition, Ondansetron, Dexamethasone, Midazolam and Scopolamine patch - Pre-op  Airway Management Planned: Oral ETT  Additional Equipment: None  Intra-op Plan:   Post-operative Plan: Extubation in OR  Informed Consent: I have reviewed the patients History and Physical, chart, labs and discussed the procedure including the risks, benefits and alternatives for the proposed anesthesia with the patient or authorized representative who has indicated his/her understanding and acceptance.     Dental advisory given  Plan Discussed with: CRNA  Anesthesia Plan Comments:        Anesthesia Quick Evaluation

## 2019-04-22 ENCOUNTER — Other Ambulatory Visit: Payer: Self-pay

## 2019-04-22 ENCOUNTER — Encounter (HOSPITAL_BASED_OUTPATIENT_CLINIC_OR_DEPARTMENT_OTHER): Payer: Self-pay | Admitting: Plastic Surgery

## 2019-04-22 ENCOUNTER — Ambulatory Visit (HOSPITAL_BASED_OUTPATIENT_CLINIC_OR_DEPARTMENT_OTHER): Payer: Medicaid Other | Admitting: Anesthesiology

## 2019-04-22 ENCOUNTER — Encounter (HOSPITAL_BASED_OUTPATIENT_CLINIC_OR_DEPARTMENT_OTHER)
Admission: RE | Disposition: A | Discharge status: Home / Self Care | Payer: Self-pay | Source: Home / Self Care | Attending: Plastic Surgery

## 2019-04-22 ENCOUNTER — Ambulatory Visit (HOSPITAL_BASED_OUTPATIENT_CLINIC_OR_DEPARTMENT_OTHER)
Admission: RE | Admit: 2019-04-22 | Discharge: 2019-04-22 | Disposition: A | Discharge status: Home / Self Care | Payer: Medicaid Other | Attending: Plastic Surgery | Admitting: Plastic Surgery

## 2019-04-22 DIAGNOSIS — T8544XA Capsular contracture of breast implant, initial encounter: Secondary | ICD-10-CM | POA: Diagnosis not present

## 2019-04-22 DIAGNOSIS — Z6841 Body Mass Index (BMI) 40.0 and over, adult: Secondary | ICD-10-CM | POA: Insufficient documentation

## 2019-04-22 DIAGNOSIS — K219 Gastro-esophageal reflux disease without esophagitis: Secondary | ICD-10-CM | POA: Insufficient documentation

## 2019-04-22 DIAGNOSIS — I10 Essential (primary) hypertension: Secondary | ICD-10-CM | POA: Diagnosis not present

## 2019-04-22 DIAGNOSIS — Z79899 Other long term (current) drug therapy: Secondary | ICD-10-CM | POA: Insufficient documentation

## 2019-04-22 DIAGNOSIS — Z9889 Other specified postprocedural states: Secondary | ICD-10-CM | POA: Diagnosis not present

## 2019-04-22 DIAGNOSIS — Y828 Other medical devices associated with adverse incidents: Secondary | ICD-10-CM | POA: Diagnosis not present

## 2019-04-22 DIAGNOSIS — Z888 Allergy status to other drugs, medicaments and biological substances status: Secondary | ICD-10-CM | POA: Insufficient documentation

## 2019-04-22 DIAGNOSIS — Z9013 Acquired absence of bilateral breasts and nipples: Secondary | ICD-10-CM | POA: Diagnosis not present

## 2019-04-22 DIAGNOSIS — Z923 Personal history of irradiation: Secondary | ICD-10-CM | POA: Insufficient documentation

## 2019-04-22 DIAGNOSIS — N6489 Other specified disorders of breast: Secondary | ICD-10-CM | POA: Diagnosis not present

## 2019-04-22 DIAGNOSIS — C50911 Malignant neoplasm of unspecified site of right female breast: Secondary | ICD-10-CM | POA: Diagnosis not present

## 2019-04-22 DIAGNOSIS — G629 Polyneuropathy, unspecified: Secondary | ICD-10-CM | POA: Insufficient documentation

## 2019-04-22 DIAGNOSIS — Z79811 Long term (current) use of aromatase inhibitors: Secondary | ICD-10-CM | POA: Insufficient documentation

## 2019-04-22 HISTORY — PX: LIPOSUCTION WITH LIPOFILLING: SHX6436

## 2019-04-22 HISTORY — PX: SCAR REVISION: SHX5285

## 2019-04-22 SURGERY — REVISION, SCAR
Anesthesia: General | Site: Breast | Laterality: Bilateral

## 2019-04-22 MED ORDER — SODIUM CHLORIDE 0.9% FLUSH: 3.0000 mL | INTRAVENOUS | Status: DC | PRN

## 2019-04-22 MED ORDER — MIDAZOLAM HCL 2 MG/2ML IJ SOLN
INTRAMUSCULAR | Status: AC
  Filled 2019-04-22: qty 2

## 2019-04-22 MED ORDER — BACITRACIN ZINC 500 UNIT/GM EX OINT
TOPICAL_OINTMENT | CUTANEOUS | Status: AC
  Filled 2019-04-22: qty 0.9

## 2019-04-22 MED ORDER — SCOPOLAMINE 1 MG/3DAYS TD PT72
MEDICATED_PATCH | TRANSDERMAL | Status: AC
  Filled 2019-04-22: qty 1

## 2019-04-22 MED ORDER — LACTATED RINGERS IV SOLN
INTRAVENOUS | Status: AC | PRN
  Administered 2019-04-22: 1000 mL

## 2019-04-22 MED ORDER — ACETAMINOPHEN 650 MG RE SUPP: 650.0000 mg | RECTAL | Status: DC | PRN

## 2019-04-22 MED ORDER — EPINEPHRINE PF 1 MG/ML IJ SOLN
INTRAMUSCULAR | Status: AC
  Filled 2019-04-22: qty 1

## 2019-04-22 MED ORDER — LIDOCAINE HCL 1 % IJ SOLN
INTRAMUSCULAR | Status: DC | PRN
  Administered 2019-04-22: 50 mL

## 2019-04-22 MED ORDER — ACETAMINOPHEN 325 MG PO TABS: 650.0000 mg | ORAL_TABLET | ORAL | Status: DC | PRN

## 2019-04-22 MED ORDER — SCOPOLAMINE 1 MG/3DAYS TD PT72
1.0000 | MEDICATED_PATCH | Freq: Once | TRANSDERMAL | Status: DC
  Administered 2019-04-22: 1.5 mg via TRANSDERMAL

## 2019-04-22 MED ORDER — PROPOFOL 10 MG/ML IV BOLUS
INTRAVENOUS | Status: DC | PRN
  Administered 2019-04-22: 200 mg via INTRAVENOUS

## 2019-04-22 MED ORDER — DEXAMETHASONE SODIUM PHOSPHATE 4 MG/ML IJ SOLN
INTRAMUSCULAR | Status: DC | PRN
  Administered 2019-04-22: 5 mg via INTRAVENOUS

## 2019-04-22 MED ORDER — OXYCODONE HCL 5 MG/5ML PO SOLN: 5.0000 mg | Freq: Once | ORAL | Status: AC | PRN

## 2019-04-22 MED ORDER — FENTANYL CITRATE (PF) 100 MCG/2ML IJ SOLN
INTRAMUSCULAR | Status: AC
  Filled 2019-04-22: qty 2

## 2019-04-22 MED ORDER — SUCCINYLCHOLINE CHLORIDE 200 MG/10ML IV SOSY
PREFILLED_SYRINGE | INTRAVENOUS | Status: AC
  Filled 2019-04-22: qty 10

## 2019-04-22 MED ORDER — DEXTROSE 5 % IV SOLN: 3.0000 g | INTRAVENOUS | Status: DC

## 2019-04-22 MED ORDER — SODIUM CHLORIDE 0.9 % IV SOLN: 250.0000 mL | INTRAVENOUS | Status: DC | PRN

## 2019-04-22 MED ORDER — EPINEPHRINE PF 1 MG/ML IJ SOLN
INTRAMUSCULAR | Status: DC | PRN
  Administered 2019-04-22: 1 mg

## 2019-04-22 MED ORDER — LIDOCAINE-EPINEPHRINE 1 %-1:100000 IJ SOLN
INTRAMUSCULAR | Status: DC | PRN
  Administered 2019-04-22: 15 mL

## 2019-04-22 MED ORDER — PROMETHAZINE HCL 25 MG/ML IJ SOLN: 6.2500 mg | INTRAMUSCULAR | Status: DC | PRN

## 2019-04-22 MED ORDER — DEXAMETHASONE SODIUM PHOSPHATE 10 MG/ML IJ SOLN
INTRAMUSCULAR | Status: AC
  Filled 2019-04-22: qty 1

## 2019-04-22 MED ORDER — CEFAZOLIN SODIUM-DEXTROSE 2-4 GM/100ML-% IV SOLN
INTRAVENOUS | Status: AC
  Filled 2019-04-22: qty 100

## 2019-04-22 MED ORDER — PHENYLEPHRINE HCL (PRESSORS) 10 MG/ML IV SOLN: INTRAVENOUS | Status: DC | PRN

## 2019-04-22 MED ORDER — SODIUM CHLORIDE 0.9% FLUSH: 3.0000 mL | Freq: Two times a day (BID) | INTRAVENOUS | Status: DC

## 2019-04-22 MED ORDER — PHENYLEPHRINE 40 MCG/ML (10ML) SYRINGE FOR IV PUSH (FOR BLOOD PRESSURE SUPPORT)
PREFILLED_SYRINGE | INTRAVENOUS | Status: AC
  Filled 2019-04-22: qty 10

## 2019-04-22 MED ORDER — BUPIVACAINE HCL (PF) 0.25 % IJ SOLN
INTRAMUSCULAR | Status: AC
  Filled 2019-04-22: qty 30

## 2019-04-22 MED ORDER — FENTANYL CITRATE (PF) 100 MCG/2ML IJ SOLN: 25.0000 ug | INTRAMUSCULAR | Status: DC | PRN

## 2019-04-22 MED ORDER — EPHEDRINE 5 MG/ML INJ
INTRAVENOUS | Status: AC
  Filled 2019-04-22: qty 10

## 2019-04-22 MED ORDER — CEFAZOLIN SODIUM-DEXTROSE 1-4 GM/50ML-% IV SOLN
INTRAVENOUS | Status: AC
  Filled 2019-04-22: qty 50

## 2019-04-22 MED ORDER — FENTANYL CITRATE (PF) 100 MCG/2ML IJ SOLN
25.0000 ug | INTRAMUSCULAR | Status: DC | PRN
  Administered 2019-04-22 (×3): 50 ug via INTRAVENOUS

## 2019-04-22 MED ORDER — ONDANSETRON HCL 4 MG/2ML IJ SOLN
INTRAMUSCULAR | Status: DC | PRN
  Administered 2019-04-22: 4 mg via INTRAVENOUS

## 2019-04-22 MED ORDER — LIDOCAINE HCL (PF) 1 % IJ SOLN
INTRAMUSCULAR | Status: AC
  Filled 2019-04-22: qty 30

## 2019-04-22 MED ORDER — LACTATED RINGERS IV SOLN: INTRAVENOUS | Status: DC

## 2019-04-22 MED ORDER — SCOPOLAMINE 1 MG/3DAYS TD PT72
1.0000 | MEDICATED_PATCH | TRANSDERMAL | Status: DC
  Administered 2019-04-22: 1.5 mg via TRANSDERMAL

## 2019-04-22 MED ORDER — MIDAZOLAM HCL 2 MG/2ML IJ SOLN
1.0000 mg | INTRAMUSCULAR | Status: DC | PRN
  Administered 2019-04-22: 09:00:00 2 mg via INTRAVENOUS

## 2019-04-22 MED ORDER — ACETAMINOPHEN 500 MG PO TABS
1000.0000 mg | ORAL_TABLET | Freq: Once | ORAL | Status: AC
  Administered 2019-04-22: 1000 mg via ORAL

## 2019-04-22 MED ORDER — EPHEDRINE SULFATE 50 MG/ML IJ SOLN
INTRAMUSCULAR | Status: DC | PRN
  Administered 2019-04-22: 15 mg via INTRAVENOUS

## 2019-04-22 MED ORDER — LIDOCAINE-EPINEPHRINE 1 %-1:100000 IJ SOLN
INTRAMUSCULAR | Status: AC
  Filled 2019-04-22: qty 1

## 2019-04-22 MED ORDER — SODIUM CHLORIDE 0.9 % IV SOLN
INTRAVENOUS | Status: AC
  Filled 2019-04-22: qty 500000

## 2019-04-22 MED ORDER — ONDANSETRON HCL 4 MG/2ML IJ SOLN
INTRAMUSCULAR | Status: AC
  Filled 2019-04-22: qty 2

## 2019-04-22 MED ORDER — OXYCODONE HCL 5 MG PO TABS
ORAL_TABLET | ORAL | Status: AC
  Filled 2019-04-22: qty 1

## 2019-04-22 MED ORDER — OXYCODONE HCL 5 MG PO TABS
5.0000 mg | ORAL_TABLET | Freq: Once | ORAL | Status: AC | PRN
  Administered 2019-04-22: 5 mg via ORAL

## 2019-04-22 MED ORDER — LIDOCAINE HCL (CARDIAC) PF 100 MG/5ML IV SOSY
PREFILLED_SYRINGE | INTRAVENOUS | Status: DC | PRN
  Administered 2019-04-22: 100 mg via INTRAVENOUS

## 2019-04-22 MED ORDER — CHLORHEXIDINE GLUCONATE CLOTH 2 % EX PADS: 6.0000 | MEDICATED_PAD | Freq: Once | CUTANEOUS | Status: DC

## 2019-04-22 MED ORDER — FENTANYL CITRATE (PF) 100 MCG/2ML IJ SOLN
50.0000 ug | INTRAMUSCULAR | Status: DC | PRN
  Administered 2019-04-22 (×2): 50 ug via INTRAVENOUS

## 2019-04-22 MED ORDER — LIDOCAINE 2% (20 MG/ML) 5 ML SYRINGE
INTRAMUSCULAR | Status: AC
  Filled 2019-04-22: qty 5

## 2019-04-22 MED ORDER — OXYCODONE HCL 5 MG PO TABS: 5.0000 mg | ORAL_TABLET | ORAL | Status: DC | PRN

## 2019-04-22 MED ORDER — ACETAMINOPHEN 500 MG PO TABS
ORAL_TABLET | ORAL | Status: AC
  Filled 2019-04-22: qty 2

## 2019-04-22 SURGICAL SUPPLY — 92 items
BINDER ABDOMINAL  9 SM 30-45 (SOFTGOODS)
BINDER ABDOMINAL 10 UNV 27-48 (MISCELLANEOUS) IMPLANT
BINDER ABDOMINAL 12 SM 30-45 (SOFTGOODS) IMPLANT
BINDER ABDOMINAL 12 XL 75-84 (SOFTGOODS) ×3 IMPLANT
BINDER ABDOMINAL 9 SM 30-45 (SOFTGOODS) IMPLANT
BINDER BREAST 3XL (GAUZE/BANDAGES/DRESSINGS) ×3 IMPLANT
BINDER BREAST LRG (GAUZE/BANDAGES/DRESSINGS) IMPLANT
BINDER BREAST MEDIUM (GAUZE/BANDAGES/DRESSINGS) IMPLANT
BINDER BREAST XLRG (GAUZE/BANDAGES/DRESSINGS) IMPLANT
BINDER BREAST XXLRG (GAUZE/BANDAGES/DRESSINGS) IMPLANT
BLADE CLIPPER SURG (BLADE) IMPLANT
BLADE HEX COATED 2.75 (ELECTRODE) IMPLANT
BLADE SURG 15 STRL LF DISP TIS (BLADE) ×1 IMPLANT
BLADE SURG 15 STRL SS (BLADE) ×2
BNDG CONFORM 2 STRL LF (GAUZE/BANDAGES/DRESSINGS) IMPLANT
BNDG ELASTIC 2X5.8 VLCR STR LF (GAUZE/BANDAGES/DRESSINGS) IMPLANT
BNDG GAUZE ELAST 4 BULKY (GAUZE/BANDAGES/DRESSINGS) ×6 IMPLANT
CANISTER SUCT 1200ML W/VALVE (MISCELLANEOUS) IMPLANT
CLOSURE STERI-STRIP 1/2X4 (GAUZE/BANDAGES/DRESSINGS)
CLOSURE WOUND 1/2 X4 (GAUZE/BANDAGES/DRESSINGS)
CLSR STERI-STRIP ANTIMIC 1/2X4 (GAUZE/BANDAGES/DRESSINGS) IMPLANT
CORD BIPOLAR FORCEPS 12FT (ELECTRODE) IMPLANT
COVER BACK TABLE 60X90IN (DRAPES) ×3 IMPLANT
COVER MAYO STAND STRL (DRAPES) ×3 IMPLANT
COVER WAND RF STERILE (DRAPES) IMPLANT
DECANTER SPIKE VIAL GLASS SM (MISCELLANEOUS) IMPLANT
DERMABOND ADVANCED (GAUZE/BANDAGES/DRESSINGS) ×2
DERMABOND ADVANCED .7 DNX12 (GAUZE/BANDAGES/DRESSINGS) ×1 IMPLANT
DRAPE HALF SHEET 70X43 (DRAPES) IMPLANT
DRAPE LAPAROSCOPIC ABDOMINAL (DRAPES) ×3 IMPLANT
DRAPE LAPAROTOMY 100X72 PEDS (DRAPES) IMPLANT
DRAPE U-SHAPE 76X120 STRL (DRAPES) IMPLANT
DRSG PAD ABDOMINAL 8X10 ST (GAUZE/BANDAGES/DRESSINGS) ×6 IMPLANT
ELECT COATED BLADE 2.86 ST (ELECTRODE) IMPLANT
ELECT NEEDLE BLADE 2-5/6 (NEEDLE) ×3 IMPLANT
ELECT REM PT RETURN 9FT ADLT (ELECTROSURGICAL) ×3
ELECT REM PT RETURN 9FT PED (ELECTROSURGICAL)
ELECTRODE REM PT RETRN 9FT PED (ELECTROSURGICAL) IMPLANT
ELECTRODE REM PT RTRN 9FT ADLT (ELECTROSURGICAL) ×1 IMPLANT
EXTRACTOR CANIST REVOLVE STRL (CANNISTER) ×3 IMPLANT
GAUZE SPONGE 4X4 12PLY STRL LF (GAUZE/BANDAGES/DRESSINGS) IMPLANT
GAUZE XEROFORM 1X8 LF (GAUZE/BANDAGES/DRESSINGS) IMPLANT
GLOVE BIO SURGEON STRL SZ 6.5 (GLOVE) ×12 IMPLANT
GLOVE BIO SURGEONS STRL SZ 6.5 (GLOVE) ×6
GOWN STRL REUS W/ TWL LRG LVL3 (GOWN DISPOSABLE) ×5 IMPLANT
GOWN STRL REUS W/TWL LRG LVL3 (GOWN DISPOSABLE) ×10
IV LACTATED RINGERS 1000ML (IV SOLUTION) ×6 IMPLANT
LINER CANISTER 1000CC FLEX (MISCELLANEOUS) ×3 IMPLANT
NDL SAFETY ECLIPSE 18X1.5 (NEEDLE) ×1 IMPLANT
NEEDLE HYPO 18GX1.5 SHARP (NEEDLE) ×2
NEEDLE HYPO 25X1 1.5 SAFETY (NEEDLE) IMPLANT
NEEDLE HYPO 30GX1 BEV (NEEDLE) IMPLANT
NEEDLE PRECISIONGLIDE 27X1.5 (NEEDLE) IMPLANT
NS IRRIG 1000ML POUR BTL (IV SOLUTION) IMPLANT
PACK BASIN DAY SURGERY FS (CUSTOM PROCEDURE TRAY) ×3 IMPLANT
PAD ALCOHOL SWAB (MISCELLANEOUS) ×3 IMPLANT
PENCIL SMOKE EVACUATOR (MISCELLANEOUS) ×3 IMPLANT
SLEEVE SCD COMPRESS KNEE MED (MISCELLANEOUS) ×3 IMPLANT
SPONGE GAUZE 2X2 8PLY STER LF (GAUZE/BANDAGES/DRESSINGS)
SPONGE GAUZE 2X2 8PLY STRL LF (GAUZE/BANDAGES/DRESSINGS) IMPLANT
SPONGE LAP 18X18 RF (DISPOSABLE) ×3 IMPLANT
STRIP CLOSURE SKIN 1/2X4 (GAUZE/BANDAGES/DRESSINGS) IMPLANT
STRIP SUTURE WOUND CLOSURE 1/2 (MISCELLANEOUS) IMPLANT
SUCTION FRAZIER HANDLE 10FR (MISCELLANEOUS)
SUCTION TUBE FRAZIER 10FR DISP (MISCELLANEOUS) IMPLANT
SUT CHROMIC 4 0 P 3 18 (SUTURE) IMPLANT
SUT ETHILON 4 0 PS 2 18 (SUTURE) IMPLANT
SUT ETHILON 5 0 P 3 18 (SUTURE)
SUT MNCRL 6-0 UNDY P1 1X18 (SUTURE) IMPLANT
SUT MNCRL AB 4-0 PS2 18 (SUTURE) IMPLANT
SUT MON AB 5-0 P3 18 (SUTURE) IMPLANT
SUT MON AB 5-0 PS2 18 (SUTURE) ×6 IMPLANT
SUT MONOCRYL 6-0 P1 1X18 (SUTURE)
SUT NYLON ETHILON 5-0 P-3 1X18 (SUTURE) IMPLANT
SUT PLAIN 5 0 P 3 18 (SUTURE) IMPLANT
SUT VIC AB 5-0 P-3 18X BRD (SUTURE) IMPLANT
SUT VIC AB 5-0 P3 18 (SUTURE)
SUT VICRYL 4-0 PS2 18IN ABS (SUTURE) IMPLANT
SUT VICRYL 6 0 P 1 18 (SUTURE) IMPLANT
SYR 10ML LL (SYRINGE) ×9 IMPLANT
SYR 3ML 18GX1 1/2 (SYRINGE) IMPLANT
SYR 50ML LL SCALE MARK (SYRINGE) ×3 IMPLANT
SYR BULB 3OZ (MISCELLANEOUS) IMPLANT
SYR CONTROL 10ML LL (SYRINGE) ×3 IMPLANT
SYR TOOMEY 50ML (SYRINGE) IMPLANT
TOWEL GREEN STERILE FF (TOWEL DISPOSABLE) ×6 IMPLANT
TRAY DSU PREP LF (CUSTOM PROCEDURE TRAY) ×3 IMPLANT
TUBE CONNECTING 20'X1/4 (TUBING) ×1
TUBE CONNECTING 20X1/4 (TUBING) ×2 IMPLANT
TUBING INFILTRATION IT-10001 (TUBING) ×3 IMPLANT
TUBING SET GRADUATE ASPIR 12FT (MISCELLANEOUS) ×3 IMPLANT
UNDERPAD 30X36 HEAVY ABSORB (UNDERPADS AND DIAPERS) ×6 IMPLANT

## 2019-04-22 NOTE — Anesthesia Procedure Notes (Signed)
Procedure Name: LMA Insertion Date/Time: 04/22/2019 9:17 AM Performed by: Willa Frater, CRNA Pre-anesthesia Checklist: Patient identified, Emergency Drugs available, Suction available and Patient being monitored Patient Re-evaluated:Patient Re-evaluated prior to induction Oxygen Delivery Method: Circle system utilized Preoxygenation: Pre-oxygenation with 100% oxygen Induction Type: IV induction Ventilation: Mask ventilation without difficulty LMA: LMA inserted LMA Size: 4.0 Number of attempts: 1 Airway Equipment and Method: Bite block Placement Confirmation: positive ETCO2 Tube secured with: Tape Dental Injury: Teeth and Oropharynx as per pre-operative assessment

## 2019-04-22 NOTE — Transfer of Care (Signed)
Immediate Anesthesia Transfer of Care Note  Patient: Michelle Mcmillan  Procedure(s) Performed: Excision of bilateral scar contracture (Bilateral Breast) fat grafting/liposuction and lipofilling bilateral breasts (Bilateral Breast)  Patient Location: PACU  Anesthesia Type:General  Level of Consciousness: awake, alert  and oriented  Airway & Oxygen Therapy: Patient Spontanous Breathing and Patient connected to face mask oxygen  Post-op Assessment: Report given to RN and Post -op Vital signs reviewed and stable  Post vital signs: Reviewed and stable  Last Vitals:  Vitals Value Taken Time  BP    Temp    Pulse 79 04/22/19 1042  Resp    SpO2 100 % 04/22/19 1042  Vitals shown include unvalidated device data.  Last Pain:  Vitals:   04/22/19 0754  TempSrc: Temporal  PainSc: 0-No pain      Patients Stated Pain Goal: 5 ([id number])  Complications: No apparent anesthesia complications

## 2019-04-22 NOTE — Interval H&P Note (Signed)
History and Physical Interval Note:  04/22/2019 8:38 AM  [name]  has presented today for surgery, with the diagnosis of status post bilateral mastectomy.  The various methods of treatment have been discussed with the patient and family. After consideration of risks, benefits and other options for treatment, the patient has consented to  Procedure(s) with comments: Excision of bilateral scar contracture (Bilateral) - total case 90 min fat grafting/liposuction and lipofilling bilateral breasts (Bilateral) as a surgical intervention.  The patient's history has been reviewed, patient examined, no change in status, stable for surgery.  I have reviewed the patient's chart and labs.  Questions were answered to the patient's satisfaction.     Michelle Mcmillan Michelle Mcmillan

## 2019-04-22 NOTE — Anesthesia Postprocedure Evaluation (Signed)
Anesthesia Post Note  Patient: Michelle Mcmillan  Procedure(s) Performed: Excision of bilateral scar contracture (Bilateral Breast) fat grafting/liposuction and lipofilling bilateral breasts (Bilateral Breast)     Patient location during evaluation: PACU Anesthesia Type: General Level of consciousness: awake and alert and oriented Pain management: pain level controlled Vital Signs Assessment: post-procedure vital signs reviewed and stable Respiratory status: spontaneous breathing, nonlabored ventilation and respiratory function stable Cardiovascular status: blood pressure returned to baseline Postop Assessment: no apparent nausea or vomiting Anesthetic complications: no    Last Vitals:  Vitals:   04/22/19 1044 04/22/19 1045  BP:  (!) 141/95  Pulse: 72 66  Resp: 13 12  Temp:    SpO2: 98% 100%    Last Pain:  Vitals:   04/22/19 1053  TempSrc:   PainSc: [place]

## 2019-04-22 NOTE — Op Note (Addendum)
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  SURGEON: Adonay Scheier [place] Rameen Gohlke, DO  ASSISTANT: Phoebe Sharps, PA  EBL: 5 cc  CONDITION: Stable  COMPLICATIONS: None  INDICATION: The patient, Michelle Mcmillan, is a 49 y.o. female born on 1970-04-19, is here for treatment of acquired absence of bilateral breasts.  She had a contracture of the lateral left breast.  The right breast has relaxed a great deal  PROCEDURE DETAILS:  The patient was seen prior to surgery and marked.  The IV antibiotics were given. The patient was taken to the operating room and given a general anesthetic. A standard time out was performed and all information was confirmed by those in the room. SCDs were placed.   The patient was prepped and draped with betadine.  The local was placed in the abdomen above the umbilicus.  The 15 blade was used to make a small incision.  The tumescent was infused in the upper and lateral abdominal area.  Once the local had time to take effect the liposuction was performed.  The lipofilling was collected in the revolve system.  The fat was washed according to the manufacture guidelines 3 times.  The fat was then collected in 10 cc syringes.  The medial and lateral aspect of the breast was injected with local for intraoperative hemostasis and postop pain management.  The fat was then injected into the subcutaneous area of the right upper breast.  The right breast was injected with 70 cc.  Liposuction was then performed on the lateral aspect of the right to help with improving the contour.  All those incisions were closed with 5-0 Monocryl.  Attention was then turned to the left breast.  Local was placed in the medial and lateral aspect.  An area of 3 x 6 cm that was  contracted on the left breast was excised using a #15 blade.  Hemostasis was achieved with electrocautery.  The deep layer was closed with 4-0 Monocryl followed by 5-0 Monocryl for the running subcuticular closure.  A small incision was placed on the medial aspect.  The medial and superior medial portion of the left breast at 170 cc of fat injected for improvement in the asymmetry.  There were several areas where there was contracture on the medial aspect of both breasts and this was released with the pickle fork.  A small amount of liposuction was done in the lateral aspect to improve symmetry and contour.  The incision was closed with the 5-0 Monocryl. The patient was allowed to wake up and taken to recovery room in stable condition at the end of the case. The family was notified at the end of the case.   The advanced practice practitioner (APP) assisted throughout the case.  The APP was essential in retraction and counter traction when needed to make the case progress smoothly.  This retraction and assistance made it possible to see the tissue plans for the procedure.  The assistance was needed for blood control, tissue re-approximation and assisted with closure of the incision site.   The [place] was signed into law in 2016 which includes the topic of electronic health records.  This provides immediate access to information in MyChart.  This includes consultation notes, operative notes, office notes, lab results and pathology reports.  If  you have any questions about what you read please let us know at your next visit or call us at the office.  We are right here with you.

## 2019-04-22 NOTE — Discharge Instructions (Signed)
No Tylenol until 2pm! 5mg  oxycodone given at 12:41pm    INSTRUCTIONS FOR AFTER SURGERY   You will likely have some questions about what to expect following your operation.  The following information will help you and your family understand what to expect when you are discharged from the hospital.  Following these guidelines will help ensure a smooth recovery and reduce risks of complications.  Postoperative instructions include information on: diet, wound care, medications and physical activity.  AFTER SURGERY Expect to go home after the procedure.  In some cases, you may need to spend one night in the hospital for observation.  DIET This surgery does not require a specific diet.  However, I have to mention that the healthier you eat the better your body can start healing. It is important to increasing your protein intake.  This means limiting the foods with added sugar.  Focus on fruits and vegetables and some meat.  If you have any liposuction during your procedure be sure to drink water.  If your urine is bright yellow, then it is concentrated, and you need to drink more water.  As a general rule after surgery, you should have 8 ounces of water every hour while awake.  If you find you are persistently nauseated or unable to take in liquids let us know.  NO TOBACCO USE or EXPOSURE.  This will slow your healing process and increase the risk of a wound.  WOUND CARE If you don't have a drain: You can shower the day after surgery.  Use fragrance free soap.  Dial, Justice, [place] and Cetaphil are usually mild on the skin.   If you have steri-strips / tape directly attached to your skin leave them in place. It is OK to get these wet.  No baths, pools or hot tubs for two weeks. We close your incision to leave the smallest and best-looking scar. No ointment or creams on your incisions until given the go ahead.  Especially not Neosporin (Too many skin reactions with this one).  A few weeks after surgery you  can use Mederma and start massaging the scar. We ask you to wear your binder or sports bra for the first 6 weeks around the clock, including while sleeping. This provides added comfort and helps reduce the fluid accumulation at the surgery site.  ACTIVITY No heavy lifting until cleared by the doctor.  It is OK to walk and climb stairs. In fact, moving your legs is very important to decrease your risk of a blood clot.  It will also help keep you from getting deconditioned.  Every 1 to 2 hours get up and walk for 5 minutes. This will help with a quicker recovery back to normal.  Let pain be your guide so you don't do too much.  NO, you cannot do the spring cleaning and don't plan on taking care of anyone else.  This is your time for TLC.   WORK Everyone returns to work at different times. As a rough guide, most people take at least 1 - 2 weeks off prior to returning to work. If you need documentation for your job, bring the forms to your postoperative follow up visit.  DRIVING Arrange for someone to bring you home from the hospital.  You may be able to drive a few days after surgery but not while taking any narcotics or valium.  BOWEL MOVEMENTS Constipation can occur after anesthesia and while taking pain medication.  It is important to stay ahead  for your comfort.  We recommend taking Milk of Magnesia (2 tablespoons; twice a day) while taking the pain pills.  SEROMA This is fluid your body tried to put in the surgical site.  This is normal but if it creates excessive pain and swelling let us know.  It usually decreases in a few weeks.  MEDICATIONS and PAIN CONTROL At your preoperative visit for you history and physical you were given the following medications: 1. An antibiotic: Start this medication when you get home and take according to the instructions on the bottle. 2. Zofran 4 mg:  This is to treat nausea and vomiting.  You can take this every 6 hours as needed and only if needed. 3. Norco  (hydrocodone/acetaminophen) 5/325 mg:  This is only to be used after you have taken the motrin or the tylenol. Every 8 hours as needed. Over the counter Medication to take: 4. Ibuprofen (Motrin) 600 mg:  Take this every 6 hours.  If you have additional pain then take 500 mg of the tylenol.  Only take the Norco after you have tried these two. 5. Miralax or stool softener of choice: Take this according to the bottle if you take the [place] Call your surgeon's office if any of the following occur: . Fever 101 degrees F or greater . Excessive bleeding or fluid from the incision site. . Pain that increases over time without aid from the medications . Redness, warmth, or pus draining from incision sites . Persistent nausea or inability to take in liquids . Severe misshapen area that underwent the operation.     Post Anesthesia Home Care Instructions  Activity: Get plenty of rest for the remainder of the day. A responsible individual must stay with you for 24 hours following the procedure.  For the next 24 hours, DO NOT: -Drive a car -Paediatric nurse -Drink alcoholic beverages -Take any medication unless instructed by your physician -Make any legal decisions or sign important papers.  Meals: Start with liquid foods such as gelatin or soup. Progress to regular foods as tolerated. Avoid greasy, spicy, heavy foods. If nausea and/or vomiting occur, drink only clear liquids until the nausea and/or vomiting subsides. Call your physician if vomiting continues.  Special Instructions/Symptoms: Your throat may feel dry or sore from the anesthesia or the breathing tube placed in your throat during surgery. If this causes discomfort, gargle with warm salt water. The discomfort should disappear within 24 hours.  If you had a scopolamine patch placed behind your ear for the management of post- operative nausea and/or vomiting:  1. The medication in the patch is effective for 72 hours,  after which it should be removed.  Wrap patch in a tissue and discard in the trash. Wash hands thoroughly with soap and water. 2. You may remove the patch earlier than 72 hours if you experience unpleasant side effects which may include dry mouth, dizziness or visual disturbances. 3. Avoid touching the patch. Wash your hands with soap and water after contact with the patch.

## 2019-04-23 ENCOUNTER — Encounter: Payer: Self-pay | Admitting: *Deleted

## 2019-04-29 ENCOUNTER — Telehealth: Payer: Self-pay

## 2019-04-29 NOTE — Telephone Encounter (Signed)

## 2019-04-30 ENCOUNTER — Encounter: Payer: Self-pay | Admitting: Plastic Surgery

## 2019-04-30 ENCOUNTER — Other Ambulatory Visit: Payer: Self-pay

## 2019-04-30 ENCOUNTER — Ambulatory Visit (INDEPENDENT_AMBULATORY_CARE_PROVIDER_SITE_OTHER): Payer: Medicaid Other | Admitting: Plastic Surgery

## 2019-04-30 VITALS — BP 147/89 | HR 71 | Temp 98.2°F | Ht 69.0 in | Wt 308.0 lb

## 2019-04-30 DIAGNOSIS — Z9889 Other specified postprocedural states: Secondary | ICD-10-CM

## 2019-04-30 NOTE — Progress Notes (Signed)
Ms. Westervelt is here after excision of left breast contracture 3 x 6 cm and lipofilling of bilateral breasts to improve breast asymmetry on 04/22/2019 with [name].  Today she reports she is doing well.  Denies chest pain, shortness of breath, constipation.  Denies pain, reports some mild muscle spasms.  Incisions are healing well, C/D/I.  No signs of redness, drainage, seroma/hematoma, or signs of infection.  Follow-up scheduled with [name] on 05/11/2019.  Call office with any questions or concerns.  The [place] was signed into law in 2016 which includes the topic of electronic health records.  This provides immediate access to information in MyChart.  This includes consultation notes, operative notes, office notes, lab results and pathology reports.  If you have any questions about what you read please let us know at your next visit or call us at the office.  We are right here with you.

## 2019-05-10 ENCOUNTER — Telehealth: Payer: Self-pay | Admitting: Plastic Surgery

## 2019-05-10 NOTE — Telephone Encounter (Signed)

## 2019-05-11 ENCOUNTER — Ambulatory Visit (INDEPENDENT_AMBULATORY_CARE_PROVIDER_SITE_OTHER): Payer: Medicaid Other | Admitting: Plastic Surgery

## 2019-05-11 ENCOUNTER — Other Ambulatory Visit: Payer: Self-pay

## 2019-05-11 ENCOUNTER — Encounter: Payer: Self-pay | Admitting: Plastic Surgery

## 2019-05-11 VITALS — BP 142/100 | HR 73 | Temp 96.9°F | Ht 69.0 in | Wt 317.6 lb

## 2019-05-11 DIAGNOSIS — Z9011 Acquired absence of right breast and nipple: Secondary | ICD-10-CM

## 2019-05-11 DIAGNOSIS — Z9889 Other specified postprocedural states: Secondary | ICD-10-CM

## 2019-05-11 NOTE — Progress Notes (Signed)
The patient is a 49 year old female here for follow-up after undergoing revision of her bilateral breast reconstruction.  She had some surgery for fat grafting and excision.  Overall she is doing extremely well.  It looks much better.  There is no sign of hematoma or seroma.  The incisions are healing nicely.  No sign of infection or redness. She is ready for nipple areolar tattoo placement. I will request a time for her. I would like to see her back in 6 months.  Call with any questions.  She can go into a sports bra now.   Pictures were obtained of the patient and placed in the chart with the patient's or guardian's permission.

## 2019-05-26 ENCOUNTER — Telehealth: Payer: Self-pay | Admitting: *Deleted

## 2019-05-26 NOTE — Telephone Encounter (Addendum)
Received DME Standard Written Order on (05/24/19) via of fax from [facility] to [place] to be signed and faxed back.  Given to provider.    Orders signed and faxed back on (05/26/19).  Confirmation received.//AB/CMA

## 2019-06-18 NOTE — Progress Notes (Signed)
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   INTERVAL HISTORY: Patient returns to clinic today for routine 42-monthevaluation.  She continues to tolerate letrozole well with only occasional hot flashes that did not affect her day-to-day activity. The subcutaneous swelling midline near her sternum remains nontender and is unchanged in size.  She does not complain of peripheral neuropathy today and has no other neurologic complaints.  She denies any pain.  She has a good appetite and denies weight loss.  She has no chest pain, shortness of breath, cough, or hemoptysis.  She denies any nausea, vomiting, constipation, or diarrhea.  She has no urinary complaints.  Patient offers no further specific complaints today.  REVIEW OF SYSTEMS:   Review of Systems  Constitutional: Negative.  Negative for diaphoresis, fever, malaise/fatigue and weight loss.  Respiratory: Negative.  Negative for cough and shortness of breath.   Cardiovascular: Negative.  Negative for chest pain and leg swelling.  Gastrointestinal: Negative.  Negative for abdominal pain, constipation, diarrhea, nausea and vomiting.  Genitourinary: Negative.  Negative for dysuria.  Musculoskeletal: Negative.  Negative for joint pain and myalgias.  Skin: Negative.  Negative for rash.  Neurological: Positive for sensory change. Negative for tingling, focal weakness and weakness.  Psychiatric/Behavioral:  Negative.  The patient is not nervous/anxious and does not have insomnia.     As per HPI. Otherwise, a complete review of systems is negative.  PAST MEDICAL HISTORY: Past Medical History:  Diagnosis Date  . Anemia    H/O  . Anemia   . Arthritis    RIGHT KNEE  . [place]   right breast  . Cancer ([place]    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)  . Dyspnea   . GERD (gastroesophageal reflux disease)    NO MEDS  . Headache    migraines  . Hypertension   . Last menstrual period (LMP) > 10 days ago 08/2015  . Lower extremity edema   . Neuropathy   . Personal history of radiation therapy     PAST SURGICAL HISTORY: Past Surgical History:  Procedure Laterality Date  . BREAST BIOPSY Right 05/08/2017   invasive mamm. ca  . BREAST EXCISIONAL BIOPSY Right 06/15/15   breast cancer  . BREAST LUMPECTOMY WITH NEEDLE LOCALIZATION Right 06/15/2015   Procedure: BREAST LUMPECTOMY WITH NEEDLE LOCALIZATION;  Surgeon: CHubbard Robinson MD;  Location: [facility];  Service: General;  Laterality: Right;  . BREAST RECONSTRUCTION WITH PLACEMENT OF TISSUE EXPANDER AND FLEX HD (ACELLULAR HYDRATED DERMIS) Left 02/09/2018   Procedure: BREAST RECONSTRUCTION WITH PLACEMENT OF TISSUE EXPANDER AND FLEX HD (ACELLULAR HYDRATED DERMIS);  Surgeon: DWallace Going DO;  Location: [facility];  Service: Plastics;  Laterality: Left;  . BREAST SURGERY    . CESAREAN SECTION  1988   x1  . COLONOSCOPY WITH PROPOFOL N/A 01/23/2017   Procedure: COLONOSCOPY WITH PROPOFOL;  Surgeon: VLin Landsman MD;  [facility];  Service: Gastroenterology;  Laterality: N/A;  . ESOPHAGOGASTRODUODENOSCOPY (EGD) WITH PROPOFOL N/A 01/23/2017  Procedure: ESOPHAGOGASTRODUODENOSCOPY (EGD) WITH PROPOFOL;  Surgeon: Lin Landsman, MD;  [facility];  Service: Gastroenterology;  Laterality: N/A;  . LATISSIMUS FLAP TO BREAST Right 06/01/2018   Procedure: RIGHT BREAST RECONSTRUCTION WITH LATISSIMUS MYOCUTANEOUS  FLAP;  Surgeon: Wallace Going, DO;  Location: [place];  Service: Plastics;  Laterality: Right;  . LIPOSUCTION WITH LIPOFILLING Bilateral 10/15/2018   Procedure: LIPOSUCTION TO BILATERAL BREASTS;  Surgeon: Wallace Going, DO;  Location: [place];  Service: Plastics;  Laterality: Bilateral;  . LIPOSUCTION WITH LIPOFILLING Bilateral 04/22/2019   Procedure: fat grafting/liposuction and lipofilling bilateral breasts;  Surgeon: Wallace Going, DO;  Location: [place];  Service: Plastics;  Laterality: Bilateral;  . MASS EXCISION N/A 05/20/2016   Procedure: EXCISION CHEST WALL MASS;  Surgeon: Florene Glen, MD;  Location: [facility];  Service: General;  Laterality: N/A;  . RECONSTRUCTION BREAST W/ LATISSIMUS DORSI FLAP Right 06/01/2018  . REMOVAL OF BILATERAL TISSUE EXPANDERS WITH PLACEMENT OF BILATERAL BREAST IMPLANTS Bilateral 10/15/2018   Procedure: REMOVAL OF BILATERAL TISSUE EXPANDERS WITH PLACEMENT OF BILATERAL BREAST IMPLANTS;  Surgeon: Wallace Going, DO;  Location: [place];  Service: Plastics;  Laterality: Bilateral;  . SCAR REVISION Bilateral 04/22/2019   Procedure: Excision of bilateral scar contracture;  Surgeon: Wallace Going, DO;  Location: [place];  Service: Plastics;  Laterality: Bilateral;  total case 90 min  . SENTINEL NODE BIOPSY Right 06/15/2015   Procedure: SENTINEL NODE BIOPSY;  Surgeon: Hubbard Robinson, MD;  Location: [facility];  Service: General;  Laterality: Right;  . TOTAL MASTECTOMY Right 05/22/2017   Procedure: TOTAL MASTECTOMY;  Surgeon: Florene Glen, MD;  Location: [facility];  Service: General;  Laterality: Right;  . TOTAL MASTECTOMY Left 02/09/2018   Procedure: LEFT PROPHYLACTIC  MASTECTOMY;  Surgeon: Jovita Kussmaul, MD;  Location: [facility];  Service: General;  Laterality: Left;  . TUBAL LIGATION  2004  . ULNAR NERVE TRANSPOSITION Left 01/29/2019   Procedure: LEFT ULNAR NERVE  DECOMPRESSION/TRANSPOSITION;  Surgeon: Leanora Cover, MD;  Location: [place];  Service: Orthopedics;  Laterality: Left;    FAMILY HISTORY Family History  Problem Relation Age of Onset  . Hypertension Mother   . Hypertension Father   . Hypertension Sister   . Breast cancer Neg Hx        ADVANCED DIRECTIVES:    HEALTH MAINTENANCE: Social History   Tobacco Use  . Smoking status: Never Smoker  . Smokeless tobacco: Never Used  Substance Use Topics  . Alcohol use: Not Currently    Comment: WINE ONCE MONTH  . Drug use: No    Allergies  Allergen Reactions  . Nitroglycerin Other (See Comments)    Headache, n/v.    Current Outpatient Medications  Medication Sig Dispense Refill  . buPROPion (WELLBUTRIN XL) 150 MG 24 hr tablet Take 150 mg by mouth daily.    . calcium-vitamin D (OSCAL WITH D) 500-200 MG-UNIT TABS tablet Take 1 tablet by mouth 2 (two) times daily. With meals    . diclofenac sodium (VOLTAREN) 1 % GEL Apply 4 g topically 4 (four) times daily.    . Ferrous Sulfate (IRON) 325 (65 Fe) MG TABS Take 1 tablet (325 mg total) by mouth 2 (two) times daily. 60 each 2  . gabapentin (NEURONTIN) 300 MG capsule TAKE 1 CAPSULE BY MOUTH THREE TIMES DAILY .MAY  TAKE 2 CAPSULE AT  BEDTIME IF MORE BOTHERSOME AT NIGHT 90 capsule  3  . ibuprofen (ADVIL) 800 MG tablet TAKE 1 TABLET BY MOUTH EVERY 8 HOURS WITH FOOD AS NEEDED FOR PAIN    . letrozole (FEMARA) 2.5 MG tablet Take 1 tablet by mouth once daily 90 tablet 3  . omeprazole (PRILOSEC) 20 MG capsule Take 1 capsule (20 mg total) by mouth daily. 30 capsule 3  . potassium chloride SA (K-DUR,KLOR-CON) 20 MEQ tablet Take 1 tablet (20 mEq total) by mouth 2 (two) times daily. 30 tablet 1  . tetrahydrozoline 0.05 % ophthalmic solution Place 2 drops into both eyes daily.    . traZODone (DESYREL) 50 MG tablet Take 50 mg by mouth at bedtime.     No current facility-administered medications for this visit.    OBJECTIVE: Vitals:    06/23/19 1120  BP: 126/89  Pulse: 69  Temp: (!) 96.2 F (35.7 C)     Body mass index is 45.84 kg/m.    ECOG FS:0 - Asymptomatic  General: Well-developed, well-nourished, no acute distress. Eyes: Pink conjunctiva, anicteric sclera. HEENT: Normocephalic, moist mucous membranes. Breast: Bilateral mastectomy. Lungs: No audible wheezing or coughing. Heart: Regular rate and rhythm. Abdomen: Soft, nontender, no obvious distention. Musculoskeletal: No edema, cyanosis, or clubbing. Neuro: Alert, answering all questions appropriately. Cranial nerves grossly intact. Skin: No rashes or petechiae noted. Psych: Normal affect.  LAB RESULTS:  Lab Results  Component Value Date   NA 141 01/25/2019   K 3.8 01/25/2019   CL 102 01/25/2019   CO2 30 01/25/2019   GLUCOSE 88 01/25/2019   BUN 18 01/25/2019   CREATININE 1.07 (H) 01/25/2019   CALCIUM 9.3 01/25/2019   PROT 6.9 10/21/2017   ALBUMIN 3.4 (L) 10/21/2017   AST 37 10/21/2017   ALT 48 (H) 10/21/2017   ALKPHOS 79 10/21/2017   BILITOT 0.4 10/21/2017   GFRNONAA >60 01/25/2019   GFRAA >60 01/25/2019    Lab Results  Component Value Date   WBC 7.9 06/02/2018   NEUTROABS 3.7 10/21/2017   HGB 9.5 (L) 06/02/2018   HCT 31.6 (L) 06/02/2018   MCV 77.6 (L) 06/02/2018   PLT 216 06/02/2018     STUDIES: No results found.  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 BRCA 1 and 2 negative, Oncotype DX score 17 which is considered low risk.  PLAN:    1. 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  This occurred while patient was taking Aromasin.  Oncotype was not sent on her second breast cancer.  Previously, CT scans and bone scans reviewed  independently with no evidence of metastatic disease confirming stage I malignancy.  Patient completed cycle 5 of Taxotere and Cytoxan on September 16, 2017.  Patient received 1 additional cycle secondary to a reaction she had during cycle 1 and did not complete treatment at that time.  Continue letrozole for minimum 5 years completing in June 2024.  Return to clinic in 6 months for routine evaluation. 2. Genetic testing: Patient was found to be BRCA 1 and 2 negative. She expressed understanding that although her genetic testing was negative, her children are at higher risk for breast cancer than the general population.   3.  Neuropathy: Patient does not complain of this today.  Continue gabapentin as prescribed. 4.  Breast reconstruction: Patient has had bilateral mastectomy.  Continue follow-up with plastic surgery as scheduled. 5.  Chest wall mass: CT scan results from January 19, 2019 reviewed independently and reported as above with nonspecific 2.5 cm chest wall mass.  Subsequent PET scan on February 03, 2019 did not reveal any hypermetabolism.  No further interventions are needed.   6.  Bone health: Patient's most recent bone mineral density on November 16, 2018 reported T score of 0.8 which is considered normal.  Repeat in August 2021.  Patient expressed understanding and was in agreement with this plan. She also understands that She can call clinic at any time with any questions, concerns, or complaints.    Lloyd Huger, MD 06/23/19 12:06 PM

## 2019-06-22 ENCOUNTER — Encounter: Payer: Self-pay | Admitting: Oncology

## 2019-06-22 ENCOUNTER — Other Ambulatory Visit: Payer: Self-pay

## 2019-06-22 NOTE — Progress Notes (Signed)
Patient pre screened for office appointment, no questions or concerns today. Patient reminded of upcoming appointment time and date. 

## 2019-06-23 ENCOUNTER — Telehealth: Payer: Self-pay | Admitting: Emergency Medicine

## 2019-06-23 ENCOUNTER — Inpatient Hospital Stay: Payer: Medicaid Other | Attending: Oncology | Admitting: Oncology

## 2019-06-23 ENCOUNTER — Encounter: Payer: Self-pay | Admitting: Oncology

## 2019-06-23 VITALS — BP 126/89 | HR 69 | Temp 96.2°F | Wt 310.4 lb

## 2019-06-23 DIAGNOSIS — Z9013 Acquired absence of bilateral breasts and nipples: Secondary | ICD-10-CM | POA: Diagnosis not present

## 2019-06-23 DIAGNOSIS — C50411 Malignant neoplasm of upper-outer quadrant of right female breast: Secondary | ICD-10-CM | POA: Diagnosis not present

## 2019-06-23 DIAGNOSIS — I10 Essential (primary) hypertension: Secondary | ICD-10-CM | POA: Diagnosis not present

## 2019-06-23 DIAGNOSIS — G629 Polyneuropathy, unspecified: Secondary | ICD-10-CM | POA: Insufficient documentation

## 2019-06-23 DIAGNOSIS — Z79899 Other long term (current) drug therapy: Secondary | ICD-10-CM | POA: Diagnosis not present

## 2019-06-23 DIAGNOSIS — Z79811 Long term (current) use of aromatase inhibitors: Secondary | ICD-10-CM | POA: Insufficient documentation

## 2019-06-23 DIAGNOSIS — Z170 Estrogen receptor positive status [ER+]: Secondary | ICD-10-CM | POA: Insufficient documentation

## 2019-06-23 NOTE — Telephone Encounter (Signed)
Error

## 2019-06-23 NOTE — Progress Notes (Signed)
Patient states she is having a slight headache today and rates pain at a 5.

## 2019-06-30 ENCOUNTER — Ambulatory Visit: Payer: Medicaid Other | Attending: Internal Medicine

## 2019-06-30 DIAGNOSIS — Z23 Encounter for immunization: Secondary | ICD-10-CM

## 2019-06-30 NOTE — Progress Notes (Signed)
   Covid-19 Vaccination Clinic  Name:  Michelle Mcmillan    MRN: [id number] DOB: 1970-12-07  06/30/2019  [name] was observed post Covid-19 immunization for 15 minutes without incident. She was provided with Vaccine Information Sheet and instruction to access the V-Safe system.   [name] was instructed to call 911 with any severe reactions post vaccine: Marland Kitchen Difficulty breathing  . Swelling of face and throat  . A fast heartbeat  . A bad rash all over body  . Dizziness and weakness   Immunizations Administered    Name Date Dose VIS Date Route   Pfizer COVID-19 Vaccine 06/30/2019  3:57 PM 0.3 mL 03/12/2019 Intramuscular   Manufacturer: [place]   Lot: [id number]   [place]: [id number]

## 2019-07-19 ENCOUNTER — Other Ambulatory Visit: Payer: Self-pay | Admitting: Oncology

## 2019-07-22 ENCOUNTER — Telehealth: Payer: Self-pay | Admitting: *Deleted

## 2019-07-22 NOTE — Telephone Encounter (Addendum)
Received DME Standard Written Order via of fax from [facility] to [place].  Requesting signature and returned.  Given to provider to sign.   Order signed and faxed back to [facility] to [place].  Confirmation received.Copy scanned into the chart./AB/CMA

## 2019-07-25 ENCOUNTER — Ambulatory Visit: Payer: Medicaid Other | Attending: Internal Medicine

## 2019-07-25 DIAGNOSIS — Z23 Encounter for immunization: Secondary | ICD-10-CM

## 2019-07-25 NOTE — Progress Notes (Signed)
   Covid-19 Vaccination Clinic  Name:  Michelle Mcmillan    MRN: [id number] DOB: 02/01/1971  07/25/2019  [name] was observed post Covid-19 immunization for 15 minutes without incident. She was provided with Vaccine Information Sheet and instruction to access the V-Safe system.   [name] was instructed to call 911 with any severe reactions post vaccine: Marland Kitchen Difficulty breathing  . Swelling of face and throat  . A fast heartbeat  . A bad rash all over body  . Dizziness and weakness   Immunizations Administered    Name Date Dose VIS Date Route   Pfizer COVID-19 Vaccine 07/25/2019 10:08 AM 0.3 mL 05/26/2018 Intramuscular   Manufacturer: [employer]   Lot: [id number]   [place]: [id number]

## 2019-08-21 ENCOUNTER — Encounter: Payer: Self-pay | Admitting: Oncology

## 2019-08-23 NOTE — Telephone Encounter (Signed)
Would you mind calling her to see if she when she would like to come in this week?  Faythe Casa, NP 08/23/2019 11:37 AM

## 2019-08-26 ENCOUNTER — Encounter: Payer: Self-pay | Admitting: Oncology

## 2019-08-26 ENCOUNTER — Inpatient Hospital Stay: Payer: Medicaid Other | Attending: Oncology | Admitting: Hospice and Palliative Medicine

## 2019-08-26 ENCOUNTER — Other Ambulatory Visit: Payer: Self-pay

## 2019-08-26 VITALS — BP 137/103 | HR 74 | Temp 97.0°F | Resp 18 | Wt 312.7 lb

## 2019-08-26 DIAGNOSIS — K1379 Other lesions of oral mucosa: Secondary | ICD-10-CM | POA: Diagnosis present

## 2019-08-26 DIAGNOSIS — Z9011 Acquired absence of right breast and nipple: Secondary | ICD-10-CM | POA: Insufficient documentation

## 2019-08-26 DIAGNOSIS — Z170 Estrogen receptor positive status [ER+]: Secondary | ICD-10-CM | POA: Diagnosis not present

## 2019-08-26 DIAGNOSIS — Z923 Personal history of irradiation: Secondary | ICD-10-CM | POA: Insufficient documentation

## 2019-08-26 DIAGNOSIS — K1370 Unspecified lesions of oral mucosa: Secondary | ICD-10-CM | POA: Diagnosis not present

## 2019-08-26 DIAGNOSIS — C50911 Malignant neoplasm of unspecified site of right female breast: Secondary | ICD-10-CM | POA: Diagnosis not present

## 2019-08-26 DIAGNOSIS — G629 Polyneuropathy, unspecified: Secondary | ICD-10-CM | POA: Diagnosis not present

## 2019-08-26 DIAGNOSIS — C50411 Malignant neoplasm of upper-outer quadrant of right female breast: Secondary | ICD-10-CM | POA: Diagnosis not present

## 2019-08-26 DIAGNOSIS — Z79811 Long term (current) use of aromatase inhibitors: Secondary | ICD-10-CM | POA: Diagnosis not present

## 2019-08-26 MED ORDER — TRIAMCINOLONE ACETONIDE 0.1 % MT PSTE: 1.0000 "application " | PASTE | Freq: Two times a day (BID) | OROMUCOSAL | 12 refills | Status: DC

## 2019-08-26 NOTE — Addendum Note (Signed)
Addended by: Mila Merry on: 08/26/2019 11:01 AM   Modules accepted: Orders

## 2019-08-26 NOTE — Progress Notes (Signed)
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  Breast cancer was initially diagnosed in 2017 and patient underwent local excision and adjuvant whole breast radiation.  She had recurrence in February 2019 and underwent mastectomy.  Pathology was felt to likely reflect a secondary primary occurring while patient was taking Aromasin.  Patient is status post Taxotere/Cytoxan completed in June 2019.  She continues to receive letrozole with expected completion June 2024.  PMH also significant for peripheral neuropathy on gabapentin, status post breast reconstruction, history of a PET negative chest wall mass  Patient presents the clinic today for evaluation of a white bump beneath her tongue.  Patient saw her dentist 2 weeks ago who incidentally found a lesion beneath patient's tongue.  Patient was told that it could be an ulcer in which case it would resolve on its own but patient was also referred to ENT.  Patient called ENT but was told there was some issue with her insurance so she presented to the clinic today for evaluation.  Patient denies tobacco or EtOH use.  Has chronic GERD but is managed with daily PPI.  No recent antibiotics.  No fever or chills.  No swelling in nodes of the neck.  No recent infections, although patient does say that she had a cold sore for the  first time about 2 weeks ago.  Denies any neurologic complaints. Denies recent fevers or illnesses. Denies any easy bleeding or bruising. Reports good appetite and denies weight loss. Denies chest pain. Denies any nausea, vomiting, constipation, or diarrhea. Denies urinary complaints. Patient offers no further specific complaints today.  PAST MEDICAL HISTORY: Past Medical History:  Diagnosis Date  . Anemia    H/O  . Anemia   . Arthritis    RIGHT KNEE  . Breast cancer ([place]) 2017   right breast  . Cancer ([place])    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)  . Dyspnea   . GERD (gastroesophageal reflux disease)    NO MEDS  . Headache    migraines  . Hypertension   . Last menstrual period (LMP) > 10 days ago 08/2015  . Lower extremity edema   . Neuropathy   . Personal history of radiation therapy     PAST SURGICAL HISTORY:  Past Surgical History:  Procedure Laterality Date  . BREAST BIOPSY Right 05/08/2017   invasive mamm. ca  . BREAST EXCISIONAL BIOPSY Right 06/15/15   breast cancer  . BREAST LUMPECTOMY WITH NEEDLE LOCALIZATION Right 06/15/2015   Procedure: BREAST LUMPECTOMY WITH NEEDLE LOCALIZATION;  Surgeon: Hubbard Robinson, MD;  Location: [facility];  Service: General;  Laterality: Right;  . BREAST RECONSTRUCTION WITH PLACEMENT OF TISSUE EXPANDER AND FLEX HD (ACELLULAR HYDRATED DERMIS) Left 02/09/2018   Procedure: BREAST RECONSTRUCTION WITH PLACEMENT OF TISSUE EXPANDER AND FLEX HD (ACELLULAR HYDRATED DERMIS);  Surgeon: Wallace Going, DO;  Location: [facility];  Service: Plastics;  Laterality: Left;  . BREAST SURGERY    .  CESAREAN SECTION  1988   x1  . COLONOSCOPY WITH PROPOFOL N/A 01/23/2017   Procedure: COLONOSCOPY WITH PROPOFOL;  Surgeon: Lin Landsman, MD;  Location: [facility] ENDOSCOPY;  Service: Gastroenterology;  Laterality: N/A;  . ESOPHAGOGASTRODUODENOSCOPY (EGD) WITH PROPOFOL N/A 01/23/2017   Procedure: ESOPHAGOGASTRODUODENOSCOPY (EGD) WITH PROPOFOL;  Surgeon: Lin Landsman, MD;  Location: [facility] ENDOSCOPY;  Service: Gastroenterology;  Laterality: N/A;  . LATISSIMUS FLAP TO BREAST Right 06/01/2018   Procedure: RIGHT BREAST RECONSTRUCTION WITH LATISSIMUS MYOCUTANEOUS FLAP;  Surgeon: Wallace Going, DO;  Location: [place];  Service: Plastics;  Laterality: Right;  . LIPOSUCTION WITH LIPOFILLING Bilateral 10/15/2018   Procedure: LIPOSUCTION TO BILATERAL BREASTS;  Surgeon: Wallace Going, DO;  Location: [place];  Service: Plastics;  Laterality: Bilateral;  . LIPOSUCTION WITH LIPOFILLING Bilateral 04/22/2019   Procedure: fat grafting/liposuction and lipofilling bilateral breasts;  Surgeon: Wallace Going, DO;  Location: [place];  Service: Plastics;  Laterality: Bilateral;  . MASS EXCISION N/A 05/20/2016   Procedure: EXCISION CHEST WALL MASS;  Surgeon: Florene Glen, MD;  Location: [facility];  Service: General;  Laterality: N/A;  . RECONSTRUCTION BREAST W/ LATISSIMUS DORSI FLAP Right 06/01/2018  . REMOVAL OF BILATERAL TISSUE EXPANDERS WITH PLACEMENT OF BILATERAL BREAST IMPLANTS Bilateral 10/15/2018   Procedure: REMOVAL OF BILATERAL TISSUE EXPANDERS WITH PLACEMENT OF BILATERAL BREAST IMPLANTS;  Surgeon: Wallace Going, DO;  Location: [place];  Service: Plastics;  Laterality: Bilateral;  . SCAR REVISION Bilateral 04/22/2019   Procedure: Excision of bilateral scar contracture;  Surgeon: Wallace Going, DO;  Location: [place];  Service: Plastics;  Laterality: Bilateral;  total case 90 min  . SENTINEL NODE BIOPSY Right 06/15/2015   Procedure: SENTINEL NODE BIOPSY;  Surgeon: Hubbard Robinson, MD;  Location: [facility];  Service: General;  Laterality: Right;  . TOTAL MASTECTOMY Right 05/22/2017   Procedure: TOTAL MASTECTOMY;  Surgeon: Florene Glen, MD;  Location: [facility];  Service: General;  Laterality: Right;  . TOTAL MASTECTOMY Left 02/09/2018   Procedure: LEFT PROPHYLACTIC   MASTECTOMY;  Surgeon: Jovita Kussmaul, MD;  Location: [facility];  Service: General;  Laterality: Left;  . TUBAL LIGATION  2004  . ULNAR NERVE TRANSPOSITION Left 01/29/2019   Procedure: LEFT ULNAR NERVE DECOMPRESSION/TRANSPOSITION;  Surgeon: Leanora Cover, MD;  Location: [place];  Service: Orthopedics;  Laterality: Left;    HEMATOLOGY/ONCOLOGY HISTORY:  Oncology History   No history exists.    ALLERGIES:  is allergic to nitroglycerin.  MEDICATIONS:  Current Outpatient Medications  Medication Sig Dispense Refill  . buPROPion (WELLBUTRIN XL) 150 MG 24 hr tablet Take 150 mg by mouth daily.    . calcium-vitamin D (OSCAL WITH D) 500-200 MG-UNIT TABS tablet Take 1 tablet by mouth 2 (two) times daily. With meals    . diclofenac sodium (VOLTAREN) 1 % GEL Apply 4 g topically 4 (four) times daily.    . Ferrous Sulfate (IRON) 325 (65 Fe) MG TABS Take 1 tablet (325 mg total) by mouth 2 (two) times daily. 60 each 2  . gabapentin (NEURONTIN) 300 MG capsule TAKE 1 CAPSULE BY MOUTH THREE TIMES DAILY .MAY  TAKE 2 CAPSULE AT  BEDTIME IF MORE BOTHERSOME AT NIGHT 90 capsule 0  . ibuprofen (ADVIL) 800 MG tablet TAKE 1 TABLET BY MOUTH EVERY 8 HOURS WITH FOOD AS NEEDED FOR PAIN    . letrozole (FEMARA) 2.5 MG tablet Take 1 tablet by mouth once daily  90 tablet 3  . omeprazole (PRILOSEC) 20 MG capsule Take 1 capsule (20 mg total) by mouth daily. 30 capsule 3  . potassium chloride SA (K-DUR,KLOR-CON) 20 MEQ tablet Take 1 tablet (20 mEq total) by mouth 2 (two) times daily. 30 tablet 1  . tetrahydrozoline 0.05 % ophthalmic solution Place 2 drops into both eyes daily.    . traZODone (DESYREL) 50 MG tablet Take 50 mg by mouth at bedtime.     No current facility-administered medications for this visit.    VITAL SIGNS: There were no vitals taken for this visit. There were no vitals filed for this visit.  Estimated body mass index is 45.84 kg/m as calculated from the following:   Height as of 05/11/19:  _0  (1.753 m).   Weight as of 06/23/19: 310 lb 6.4 oz (140.8 kg).  LABS: CBC:    Component Value Date/Time   WBC 7.9 06/02/2018 0203   HGB 9.5 (L) 06/02/2018 0203   HCT 31.6 (L) 06/02/2018 0203   PLT 216 06/02/2018 0203   MCV 77.6 (L) 06/02/2018 0203   NEUTROABS 3.7 10/21/2017 0944   LYMPHSABS 1.0 10/21/2017 0944   MONOABS 0.4 10/21/2017 0944   EOSABS 0.2 10/21/2017 0944   BASOSABS 0.0 10/21/2017 0944   Comprehensive Metabolic Panel:    Component Value Date/Time   NA 141 01/25/2019 1100   K 3.8 01/25/2019 1100   CL 102 01/25/2019 1100   CO2 30 01/25/2019 1100   BUN 18 01/25/2019 1100   CREATININE 1.07 (H) 01/25/2019 1100   GLUCOSE 88 01/25/2019 1100   CALCIUM 9.3 01/25/2019 1100   AST 37 10/21/2017 0944   ALT 48 (H) 10/21/2017 0944   ALKPHOS 79 10/21/2017 0944   BILITOT 0.4 10/21/2017 0944   PROT 6.9 10/21/2017 0944   ALBUMIN 3.4 (L) 10/21/2017 0944    RADIOGRAPHIC STUDIES: No results found.  PERFORMANCE STATUS (ECOG) : 0 - Asymptomatic  Review of Systems Unless otherwise noted, a complete review of systems is negative.  Physical Exam General: NAD HEENT: Small circular white lesion to left tongue base.  No thrush.  No lymphadenopathy to neck or chest.  See image Cardiovascular: regular rate and rhythm Pulmonary: clear anterior/posterior fields Abdomen: soft, nontender, + bowel sounds GU: no suprapubic tenderness Extremities: no edema, no joint deformities Skin: no rashes Neurological: Weakness but otherwise nonfocal      Assessment and Plan- Patient is a 49 y.o. female with recurrent stage Ia breast cancer status post chemo/XRT/mastectomy and currently managed on letrozole, who presents to the [facility] for evaluation of a painless tongue lesion present for 2 weeks.  Tongue lesion -could be an aphthous ulcer, although I would have expected this to have resolved over 2 weeks.  Patient has pending ENT referral.  Case and plan discussed with Dr. Grayland Ormond who also  reviewed the images.  Will prescribe Kenalog Orabase for patient to use topically while she waits to be evaluated by ENT. Patient to continue her letrozole and follow-up with Dr. Grayland Ormond here in September 2021 as previously scheduled.   Patient expressed understanding and was in agreement with this plan. She also understands that She can call clinic at any time with any questions, concerns, or complaints.   Thank you for allowing me to participate in the care of this very pleasant patient.   Time Total: 15 minutes  Visit consisted of counseling and education dealing with the complex and emotionally intense issues of symptom management in the setting of serious and potentially  life-threatening illness.Greater than 50%  of this time was spent counseling and coordinating care related to the above assessment and plan.  Signed by: Altha Harm, PhD, NP-C

## 2019-09-09 ENCOUNTER — Encounter: Payer: Self-pay | Admitting: Oncology

## 2019-09-09 NOTE — Telephone Encounter (Signed)
Sounds good. Thanks for your help

## 2019-09-09 NOTE — Telephone Encounter (Signed)
Does anyone know about her referral for ENT?  Faythe Casa, NP 09/09/2019 9:46 AM

## 2019-09-10 ENCOUNTER — Telehealth: Payer: Self-pay | Admitting: Emergency Medicine

## 2019-09-10 NOTE — Telephone Encounter (Signed)
Sounds good- Thanks for your help  Faythe Casa, NP 09/10/2019 1:31 PM

## 2019-09-10 NOTE — Telephone Encounter (Signed)
Called patient to let her know that we had contacted ENT office about pending referral, and had been told that due to her insurance she would need to be referred from primary care office. Verified who patient's primary care MD is so that our office can contact them to send referral. Pt verbalized understanding and didn't have any further questions or concerns.

## 2019-09-13 ENCOUNTER — Telehealth: Payer: Self-pay | Admitting: Emergency Medicine

## 2019-09-13 NOTE — Telephone Encounter (Signed)
Called PCP office in reference to PCP needing to send referral to ENT for tongue lesion. Was unable to get anyone on phone, LVM asking for them to call me back.

## 2019-09-14 DIAGNOSIS — K148 Other diseases of tongue: Secondary | ICD-10-CM | POA: Insufficient documentation

## 2019-09-20 ENCOUNTER — Other Ambulatory Visit: Payer: Self-pay

## 2019-09-20 ENCOUNTER — Ambulatory Visit (INDEPENDENT_AMBULATORY_CARE_PROVIDER_SITE_OTHER): Payer: Medicaid Other

## 2019-09-20 VITALS — BP 123/85 | HR 67 | Temp 98.2°F | Wt 310.0 lb

## 2019-09-20 DIAGNOSIS — Z9889 Other specified postprocedural states: Secondary | ICD-10-CM | POA: Diagnosis not present

## 2019-09-20 DIAGNOSIS — Z9013 Acquired absence of bilateral breasts and nipples: Secondary | ICD-10-CM

## 2019-09-21 NOTE — Patient Instructions (Signed)
Pt will f/u in 6 weeks for touch-up She is reminded to keep area covered with vaseline & gauze dressing She will call for any concerns

## 2019-09-21 NOTE — Progress Notes (Signed)
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 Lyndee Leo Sanger   ANESTHESIA:  EMLA  COMPLICATIONS: None.  JUSTIFICATION FOR PROCEDURE:  [name] is a 49 y.o. female with a history of breast cancer status post  Bilateral breast reconstruction. The patient presents for nipple areolar complex tattoo. Risks, benefits, indications, and alternatives of the above described procedures were discussed with the patient and all the patient's questions were answered.   DESCRIPTION OF PROCEDURE: After informed consent was obtained and proper identification of patient and surgical site was made, the patient was taken to the procedure room. Pre-procedure photos were obtained & entered into chart. Placement, size & colors/shades were decided by myself & pt.  . A time out was performed to confirm patient's identity and surgical site.  The patient was placed supine on the operating room table.  She was prepped and draped in the usual sterile fashion.   Using a #7 & a #9 tattoo head, pigment was instilled to the designed nipple areolar complex. Once adequate pigment had been applied to the nipple areolar complex, a post-procedure photo was obtained & entered into chart.  vaseline & gauze dressing was applied.  The patient tolerated the procedure well.  Post-procedure instructions were reviewed & copy given to pt. She is reminded to call for any concerns.  Tattoo Ink used: [employer] Forklot# [id number] / exp. 03/10/2022 Chipper Oman- lot# [id number] / exp. 03/10/2022 Dark Mink- lot# [id number] / exp. 03/10/2022 Cool Mink- lot# [id number] / 03/10/2022 [name] Skin- lot# [id number]/11/14/2022 Dark Honey- lot# [id number] / exp. 03/10/2022 [name]- lot#[id number]  / exp. 06/22/2022 Sterile eyewash solution was used to dilute/mix colors as needed

## 2019-09-23 ENCOUNTER — Encounter: Payer: Self-pay | Admitting: Oncology

## 2019-09-28 ENCOUNTER — Other Ambulatory Visit: Payer: Self-pay | Admitting: Oncology

## 2019-10-01 ENCOUNTER — Other Ambulatory Visit: Payer: Self-pay | Admitting: Pharmacist

## 2019-10-06 ENCOUNTER — Other Ambulatory Visit: Payer: Self-pay | Admitting: Physician Assistant

## 2019-10-08 LAB — SURGICAL PATHOLOGY

## 2019-11-01 ENCOUNTER — Other Ambulatory Visit: Payer: Self-pay

## 2019-11-01 ENCOUNTER — Ambulatory Visit (INDEPENDENT_AMBULATORY_CARE_PROVIDER_SITE_OTHER): Payer: Medicare Other

## 2019-11-01 VITALS — BP 138/85 | HR 87 | Temp 98.2°F

## 2019-11-01 DIAGNOSIS — Z9011 Acquired absence of right breast and nipple: Secondary | ICD-10-CM | POA: Diagnosis not present

## 2019-11-01 DIAGNOSIS — Z9889 Other specified postprocedural states: Secondary | ICD-10-CM

## 2019-11-01 DIAGNOSIS — G43909 Migraine, unspecified, not intractable, without status migrainosus: Secondary | ICD-10-CM | POA: Insufficient documentation

## 2019-11-01 NOTE — Patient Instructions (Signed)
Pt will call for any questions or concerns Use vaseline/gauze for approx. 2 weeks F/u in 6 weeks for touch-up if needed

## 2019-11-01 NOTE — Progress Notes (Signed)
NIPPLE AREOLAR TATTOO PROCEDURE  PREOPERATIVE DIAGNOSIS:  Acquired absence of (BILATERAL) nipple areolar   POSTOPERATIVE DIAGNOSIS: Acquired absence of (BILATERAL) nipple areolar    PROCEDURES: (BILATERAL) nipple areolar tattoo   ATTENDING SURGEON: Dr. Windy Canny  ANESTHESIA:  EMLA  COMPLICATIONS: None.  JUSTIFICATION FOR PROCEDURE:  Michelle Mcmillan is a 49 y.o. female with a history of breast cancer status post bilateral breast reconstruction.  The patient presents for bilateral nipple areolar complex tattoo touch-up  Risks, benefits, indications, and alternatives of the above described procedures were discussed with the patient and all the patient's questions were answered.   DESCRIPTION OF PROCEDURE: After informed consent  and proper identification of patient and surgical site was obtained -pt was taken to the procedure room.  pre-procedure photos were obtained & entered into her chart. The patient was placed supine on the operating room table.  colors and shades were chosen for touch-up of bilateral NAC.  The patient was prepped and draped in the usual sterile fashion.  Using a #7 tattoo head, pigment was instilled to the designed nipple areolar complex with [employer] tattoo machine.  Once adequate pigment had been applied to the nipple areolar complex, post-procedure photos were taken & entered into chart  vaseline & gauze dressing was applied.  The patient tolerated the procedure well.  Post-care instructions were reviewed Ink used: [name] Skin- lot#[id number]/exp 11/14/2022 [facility]: lot# [id number]/ exp 04/06/2022 Equinox- lot# [id number]/ exp 06/23/2022 Jeni Salles- lot# [id number]/ 03/11/2022 Cool Honey- XJD#[id number]/ exp 12/29/2021 Cool Mink- QAE#[id number] / exp 03/10/2022 Sterile eyewash was used for dilution for colors/shades Duration topical anesthesia used as needed Pt understands to f/u in 6 weeks

## 2019-11-05 ENCOUNTER — Other Ambulatory Visit: Payer: Self-pay | Admitting: *Deleted

## 2019-11-05 MED ORDER — LETROZOLE 2.5 MG PO TABS: 2.5000 mg | ORAL_TABLET | Freq: Every day | ORAL | 0 refills | Status: DC

## 2019-11-09 ENCOUNTER — Other Ambulatory Visit: Payer: Self-pay

## 2019-11-09 ENCOUNTER — Ambulatory Visit (INDEPENDENT_AMBULATORY_CARE_PROVIDER_SITE_OTHER): Payer: Medicare Other | Admitting: Plastic Surgery

## 2019-11-09 ENCOUNTER — Encounter: Payer: Self-pay | Admitting: Plastic Surgery

## 2019-11-09 DIAGNOSIS — Z9013 Acquired absence of bilateral breasts and nipples: Secondary | ICD-10-CM

## 2019-11-09 DIAGNOSIS — Z9011 Acquired absence of right breast and nipple: Secondary | ICD-10-CM

## 2019-11-09 DIAGNOSIS — Z9889 Other specified postprocedural states: Secondary | ICD-10-CM

## 2019-11-09 DIAGNOSIS — Z9012 Acquired absence of left breast and nipple: Secondary | ICD-10-CM

## 2019-11-09 NOTE — Progress Notes (Signed)
   Subjective:    Patient ID: Michelle Mcmillan, female    DOB: 21-Oct-1970, 49 y.o.   MRN: [id number]  The patient is a 49 year old female here for follow-up on her breast reconstruction.  She is doing so well.  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  She showed me some examples and she is truly amazing.  Overall she is doing really well.  There is no sign of infection, hematoma or seroma in either breast.  She has had her nipple areolar tattoos.  They look great.  The left side was touched up about a week ago and has a little bit of scab.  The patient is very pleased with all of her looks.  I do not see any areas of concern.  She has had improvement in her upper extremities where she had the carpal tunnel.  There is less numbness and better movement.  She has a brace on her left foot and is planning to go to [place] clinic for a lower extremity consult.     Review of Systems  Constitutional: Negative.  Negative for activity change.  HENT: Negative.   Eyes: Negative.   Respiratory: Negative.  Negative for chest tightness and shortness of breath.   Cardiovascular: Negative for leg swelling.  Gastrointestinal: Negative for abdominal distention and abdominal pain.  Endocrine: Negative.   Genitourinary: Negative.   Musculoskeletal: Negative.   Neurological: Negative.   Hematological: Negative.   Psychiatric/Behavioral: Negative.        Objective:   Physical Exam Vitals and nursing note reviewed.  Constitutional:      Appearance: Normal appearance.  HENT:     Head: Normocephalic and atraumatic.  Cardiovascular:     Rate and Rhythm: Normal rate.     Pulses: Normal pulses.  Pulmonary:     Effort: Pulmonary effort is normal.  Abdominal:     General: Abdomen is flat. There is no distension.  Skin:    General: Skin is warm.     Capillary Refill: Capillary refill takes less than 2 seconds.  Neurological:     General: No focal deficit present.      Mental Status: She is alert and oriented to person, place, and time.  Psychiatric:        Mood and Affect: Mood normal.        Behavior: Behavior normal.       Assessment & Plan:     ICD-10-CM   1. Morbid (severe) obesity due to excess calories (HCC)  E66.01   2. Acquired absence of breast and absent nipple, right  Z90.11   3. Acquired absence of right breast  Z90.11   4. S/P breast reconstruction, bilateral  Z98.890   5. S/P mastectomy, bilateral  Z90.13   6. Acquired absence of left breast  Z90.12    Continue massage to implants and follow-up in 1 year.  She is eligible for an ultrasound in the next 2 to 3 years.  She could certainly have it done sooner if there is any cause for concern. Pictures were obtained of the patient and placed in the chart with the patient's or guardian's permission.

## 2019-11-12 ENCOUNTER — Encounter: Payer: Self-pay | Admitting: Oncology

## 2019-11-16 ENCOUNTER — Other Ambulatory Visit: Payer: Medicaid Other

## 2019-11-17 ENCOUNTER — Other Ambulatory Visit: Payer: Self-pay | Admitting: Oncology

## 2019-12-01 ENCOUNTER — Ambulatory Visit
Admission: RE | Admit: 2019-12-01 | Discharge: 2019-12-01 | Disposition: A | Discharge status: Home / Self Care | Payer: Medicare Other | Source: Ambulatory Visit | Attending: Oncology | Admitting: Oncology

## 2019-12-01 ENCOUNTER — Other Ambulatory Visit: Payer: Self-pay

## 2019-12-01 DIAGNOSIS — Z923 Personal history of irradiation: Secondary | ICD-10-CM | POA: Diagnosis not present

## 2019-12-01 DIAGNOSIS — Z13820 Encounter for screening for osteoporosis: Secondary | ICD-10-CM | POA: Diagnosis present

## 2019-12-01 DIAGNOSIS — Z170 Estrogen receptor positive status [ER+]: Secondary | ICD-10-CM

## 2019-12-01 DIAGNOSIS — Z853 Personal history of malignant neoplasm of breast: Secondary | ICD-10-CM | POA: Insufficient documentation

## 2019-12-01 DIAGNOSIS — Z780 Asymptomatic menopausal state: Secondary | ICD-10-CM | POA: Diagnosis not present

## 2019-12-01 DIAGNOSIS — Z9221 Personal history of antineoplastic chemotherapy: Secondary | ICD-10-CM | POA: Diagnosis not present

## 2019-12-19 ENCOUNTER — Other Ambulatory Visit: Payer: Self-pay | Admitting: Oncology

## 2019-12-19 NOTE — Progress Notes (Deleted)
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   INTERVAL HISTORY: Patient returns to clinic today for routine 50-monthevaluation.  She continues to tolerate letrozole well with only occasional hot flashes that did not affect her day-to-day activity. The subcutaneous swelling midline near her sternum remains nontender and is unchanged in size.  She does not complain of peripheral neuropathy today and has no other neurologic complaints.  She denies any pain.  She has a good appetite and denies weight loss.  She has no chest pain, shortness of breath, cough, or hemoptysis.  She denies any nausea, vomiting, constipation, or diarrhea.  She has no urinary complaints.  Patient offers no further specific complaints today.  REVIEW OF SYSTEMS:   Review of Systems  Constitutional: Negative.  Negative for diaphoresis, fever, malaise/fatigue and weight loss.  Respiratory: Negative.  Negative for cough and shortness of breath.   Cardiovascular: Negative.  Negative for chest pain and leg swelling.  Gastrointestinal: Negative.  Negative for abdominal pain, constipation, diarrhea, nausea and vomiting.  Genitourinary: Negative.  Negative for dysuria.  Musculoskeletal: Negative.  Negative for joint pain and myalgias.  Skin: Negative.  Negative for rash.  Neurological: Positive for sensory change. Negative for tingling, focal weakness and weakness.  Psychiatric/Behavioral: Negative.  The patient is not  nervous/anxious and does not have insomnia.     As per HPI. Otherwise, a complete review of systems is negative.  PAST MEDICAL HISTORY: Past Medical History:  Diagnosis Date  . Anemia    H/O  . Anemia   . Arthritis    RIGHT KNEE  . Breast cancer ([place]   [place] breast  . [place]    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)  . Dyspnea   . GERD (gastroesophageal reflux disease)    NO MEDS  . Headache    migraines  . Hypertension   . Last menstrual period (LMP) > 10 days ago 08/2015  . Lower extremity edema   . Neuropathy   . Personal history of radiation therapy     PAST SURGICAL HISTORY: Past Surgical History:  Procedure Laterality Date  . BREAST BIOPSY Right 05/08/2017   invasive mamm. ca  . BREAST EXCISIONAL BIOPSY Right 06/15/15   breast cancer  . BREAST LUMPECTOMY WITH NEEDLE LOCALIZATION Right 06/15/2015   Procedure: BREAST LUMPECTOMY WITH NEEDLE LOCALIZATION;  Surgeon: CHubbard Robinson MD;  Location: [facility];  Service: General;  Laterality: Right;  . BREAST RECONSTRUCTION WITH PLACEMENT OF TISSUE EXPANDER AND FLEX HD (ACELLULAR HYDRATED DERMIS) Left 02/09/2018   Procedure: BREAST RECONSTRUCTION WITH PLACEMENT OF TISSUE EXPANDER AND FLEX HD (ACELLULAR HYDRATED DERMIS);  Surgeon: DWallace Going DO;  Location: [facility];  Service: Plastics;  Laterality: Left;  . BREAST SURGERY    . CESAREAN SECTION  1988   x1  . COLONOSCOPY WITH PROPOFOL N/A 01/23/2017   Procedure: COLONOSCOPY WITH PROPOFOL;  Surgeon: VLin Landsman MD;  [facility];  Service: Gastroenterology;  Laterality: N/A;  . ESOPHAGOGASTRODUODENOSCOPY (EGD) WITH PROPOFOL N/A 01/23/2017   Procedure: ESOPHAGOGASTRODUODENOSCOPY (EGD) WITH  PROPOFOL;  Surgeon: Lin Landsman, MD;  [facility];  Service: Gastroenterology;  Laterality: N/A;  . LATISSIMUS FLAP TO BREAST Right 06/01/2018   Procedure: RIGHT BREAST RECONSTRUCTION WITH LATISSIMUS MYOCUTANEOUS FLAP;  Surgeon: Wallace Going, DO;  Location: [place];  Service: Plastics;  Laterality: Right;  . LIPOSUCTION WITH LIPOFILLING Bilateral 10/15/2018   Procedure: LIPOSUCTION TO BILATERAL BREASTS;  Surgeon: Wallace Going, DO;  Location: [place];  Service: Plastics;  Laterality: Bilateral;  . LIPOSUCTION WITH LIPOFILLING Bilateral 04/22/2019   Procedure: fat grafting/liposuction and lipofilling bilateral breasts;  Surgeon: Wallace Going, DO;  Location: [place];  Service: Plastics;  Laterality: Bilateral;  . MASS EXCISION N/A 05/20/2016   Procedure: EXCISION CHEST WALL MASS;  Surgeon: Florene Glen, MD;  Location: [facility];  Service: General;  Laterality: N/A;  . RECONSTRUCTION BREAST W/ LATISSIMUS DORSI FLAP Right 06/01/2018  . REMOVAL OF BILATERAL TISSUE EXPANDERS WITH PLACEMENT OF BILATERAL BREAST IMPLANTS Bilateral 10/15/2018   Procedure: REMOVAL OF BILATERAL TISSUE EXPANDERS WITH PLACEMENT OF BILATERAL BREAST IMPLANTS;  Surgeon: Wallace Going, DO;  Location: [place];  Service: Plastics;  Laterality: Bilateral;  . SCAR REVISION Bilateral 04/22/2019   Procedure: Excision of bilateral scar contracture;  Surgeon: Wallace Going, DO;  Location: [place];  Service: Plastics;  Laterality: Bilateral;  total case 90 min  . SENTINEL NODE BIOPSY Right 06/15/2015   Procedure: SENTINEL NODE BIOPSY;  Surgeon: Hubbard Robinson, MD;  Location: [facility];  Service: General;  Laterality: Right;  . TOTAL MASTECTOMY Right 05/22/2017   Procedure: TOTAL MASTECTOMY;  Surgeon: Florene Glen, MD;  Location: [facility];  Service: General;  Laterality: Right;  . TOTAL MASTECTOMY Left 02/09/2018   Procedure: LEFT PROPHYLACTIC  MASTECTOMY;  Surgeon: Jovita Kussmaul, MD;  Location: [facility];  Service: General;  Laterality: Left;  . TUBAL LIGATION  2004  . ULNAR NERVE TRANSPOSITION Left 01/29/2019   Procedure: LEFT ULNAR NERVE  DECOMPRESSION/TRANSPOSITION;  Surgeon: Leanora Cover, MD;  Location: [place];  Service: Orthopedics;  Laterality: Left;    FAMILY HISTORY Family History  Problem Relation Age of Onset  . Hypertension Mother   . Hypertension Father   . Hypertension Sister   . Breast cancer Neg Hx        ADVANCED DIRECTIVES:    HEALTH MAINTENANCE: Social History   Tobacco Use  . Smoking status: Never Smoker  . Smokeless tobacco: Never Used  Vaping Use  . Vaping Use: Never used  Substance Use Topics  . Alcohol use: Not Currently    Comment: WINE ONCE MONTH  . Drug use: No    Allergies  Allergen Reactions  . Nitroglycerin Other (See Comments)    Headache, n/v.    Current Outpatient Medications  Medication Sig Dispense Refill  . BAC 50-325-40 MG tablet Take 1-2 tablets by mouth every 4 (four) hours as needed.    Marland Kitchen buPROPion (WELLBUTRIN XL) 150 MG 24 hr tablet Take 150 mg by mouth daily.    . calcium-vitamin D (OSCAL WITH D) 500-200 MG-UNIT TABS tablet Take 1 tablet by mouth 2 (two) times daily. With meals    . diclofenac sodium (VOLTAREN) 1 % GEL Apply 4 g topically 4 (four) times daily.    . Ferrous Sulfate (IRON) 325 (65 Fe) MG TABS Take 1 tablet (325 mg total) by mouth 2 (two) times daily. 60 each 2  . gabapentin (NEURONTIN) 300 MG  capsule TAKE 1 CAPSULE BY MOUTH 3  TIMES DAILY AND MAY TAKE 2  CAPSULES AT BEDTIME IF MORE BOTHERSOME AT NIGHT 360 capsule 4  . ibuprofen (ADVIL) 800 MG tablet TAKE 1 TABLET BY MOUTH EVERY 8 HOURS WITH FOOD AS NEEDED FOR PAIN    . letrozole (FEMARA) 2.5 MG tablet Take 1 tablet (2.5 mg total) by mouth daily. 90 tablet 0  . omeprazole (PRILOSEC) 20 MG capsule Take 1 capsule (20 mg total) by mouth daily. 30 capsule 3  . Polyethyl Glycol-Propyl Glycol 0.4-0.3 % SOLN Apply to eye.    . potassium chloride SA (K-DUR,KLOR-CON) 20 MEQ tablet Take 1 tablet (20 mEq total) by mouth 2 (two) times daily. 30 tablet 1  . rizatriptan (MAXALT) 10 MG tablet  SMARTSIG:1 Tablet(s) By Mouth 1 to 2 Times Daily    . tetrahydrozoline 0.05 % ophthalmic solution Place 2 drops into both eyes daily.     . traZODone (DESYREL) 50 MG tablet Take 50 mg by mouth at bedtime.    . triamcinolone (KENALOG) 0.1 % paste Use as directed 1 application in the mouth or throat 2 (two) times daily. 5 g 12   No current facility-administered medications for this visit.    OBJECTIVE: There were no vitals filed for this visit.   There is no height or weight on file to calculate BMI.    ECOG FS:0 - Asymptomatic  General: Well-developed, well-nourished, no acute distress. Eyes: Pink conjunctiva, anicteric sclera. HEENT: Normocephalic, moist mucous membranes. Breast: Bilateral mastectomy. Lungs: No audible wheezing or coughing. Heart: Regular rate and rhythm. Abdomen: Soft, nontender, no obvious distention. Musculoskeletal: No edema, cyanosis, or clubbing. Neuro: Alert, answering all questions appropriately. Cranial nerves grossly intact. Skin: No rashes or petechiae noted. Psych: Normal affect.  LAB RESULTS:  Lab Results  Component Value Date   NA 141 01/25/2019   K 3.8 01/25/2019   CL 102 01/25/2019   CO2 30 01/25/2019   GLUCOSE 88 01/25/2019   BUN 18 01/25/2019   CREATININE 1.07 (H) 01/25/2019   CALCIUM 9.3 01/25/2019   PROT 6.9 10/21/2017   ALBUMIN 3.4 (L) 10/21/2017   AST 37 10/21/2017   ALT 48 (H) 10/21/2017   ALKPHOS 79 10/21/2017   BILITOT 0.4 10/21/2017   GFRNONAA >60 01/25/2019   GFRAA >60 01/25/2019    Lab Results  Component Value Date   WBC 7.9 06/02/2018   NEUTROABS 3.7 10/21/2017   HGB 9.5 (L) 06/02/2018   HCT 31.6 (L) 06/02/2018   MCV 77.6 (L) 06/02/2018   PLT 216 06/02/2018     STUDIES: DG Bone Density  Result Date: 12/01/2019 EXAM: DUAL X-RAY ABSORPTIOMETRY (DXA) FOR BONE MINERAL DENSITY IMPRESSION: Your patient Vannary Greening completed a BMD test on 12/01/2019 using the [employer] (software version: 14.10)  manufactured by [employer]. The following summarizes the results of our evaluation. Technologist: [facility] PATIENT BIOGRAPHICAL: Name: Michelle, Mcmillan Patient ID:  [id number] Birth Date: [birth year] Height:     69.0 in. Gender: Female Exam Date: 12/01/2019 Weight:     320.4 lbs. Indications: High Risk Meds, History of Breast Cancer, History of Chemo, History of Radiation, Postmenopausal Fractures: Treatments: Calcium, Femara, Vitamin D DENSITOMETRY RESULTS: Site Region Measured Date Measured Age WHO classification Young Adult T-score BMD %Change vs. Previous  Significant Change (*) AP Spine L1-L4 12/01/2019 50.0 Normal 0.7 1.282 g/cm2 -14.5%  Yes AP Spine L1-L4 09/04/2016 46.8 Normal 2.5 1.499 g/cm2 - - DualFemur Neck Left 12/01/2019 50.0 Normal 0.9 1.158 g/cm2 -6.3% Yes DualFemur Neck Left 11/16/2018 49.0 Normal 1.4 1.236 g/cm2 -0.3% - DualFemur Neck Left 11/12/2017 48.0 Normal 1.5 1.240 g/cm2 0.3% - DualFemur Neck Left 09/04/2016 46.8 Normal 1.4 1.236 g/cm2 - - DualFemur Total Mean 12/01/2019 50.0 Normal 1.8 1.231 g/cm2 -1.9% - DualFemur Total Mean 11/16/2018 49.0 Normal 2.0 1.255 g/cm2 -2.4% - DualFemur Total Mean 11/12/2017 48.0 Normal 2.2 1.286 g/cm2 1.5% - DualFemur Total Mean 09/04/2016 46.8 Normal 2.1 1.267 g/cm2 - - ASSESSMENT: The BMD measured at AP Spine L1-L4 is 1.282 g/cm2 with a T-score of 0.7. This patient is considered normal according to [facility]) criteria. The scan quality is good. Compared with prior study, there has been significant decrease in the spine. Compared with prior study, there has been no significant change in the total hip. World Pharmacologist [facility]) criteria for post-menopausal, Caucasian Women: Normal:                   T-score at or above -1 SD Osteopenia/low bone mass: T-score between -1 and -2.5 SD Osteoporosis:             T-score at or below -2.5 SD RECOMMENDATIONS: 1. All patients should optimize calcium and  vitamin D intake. 2. Consider FDA-approved medical therapies in postmenopausal women and men aged 90 years and older, based on the following: a. A hip or vertebral(clinical or morphometric) fracture b. T-score < -2.5 at the femoral neck or spine after appropriate evaluation to exclude secondary causes c. Low bone mass (T-score between -1.0 and -2.5 at the femoral neck or spine) and a 10-year probability of a hip fracture > 3% or a 10-year probability of a major osteoporosis-related fracture > 20% based on the [place]-adapted WHO algorithm 3. Clinician judgment and/or patient preferences may indicate treatment for people with 10-year fracture probabilities above or below these levels FOLLOW-UP: People with diagnosed cases of osteoporosis or at high risk for fracture should have regular bone mineral density tests. For patients eligible for Medicare, routine testing is allowed once every 2 years. The testing frequency can be increased to one year for patients who have rapidly progressing disease, those who are receiving or discontinuing medical therapy to restore bone mass, or have additional risk factors. I have reviewed this report, and agree with the above findings. Peggye Fothergill, MD [facility] Electronically Signed   By: Evangeline Dakin M.D.   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 BRCA 1 and 2 negative, Oncotype DX score 17 which is considered low risk.  PLAN:    1. 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  This occurred while patient was taking Aromasin.  Oncotype was not sent on her second breast cancer.  Previously, CT scans and bone scans reviewed independently  with no evidence of metastatic disease confirming stage I malignancy.  Patient completed cycle 5 of Taxotere and Cytoxan on September 16, 2017.  Patient received 1 additional cycle secondary to a reaction she had during cycle 1 and did not complete treatment at that time.  Continue letrozole for minimum 5 years  completing in June 2024.  Return to clinic in 6 months for routine evaluation. 2. Genetic testing: Patient was found to be BRCA 1 and 2 negative. She expressed understanding that although her genetic testing was negative, her children are at higher risk for breast cancer than the general population.   3.  Neuropathy: Patient does not complain of this today.  Continue gabapentin as prescribed. 4.  Breast reconstruction: Patient has had bilateral mastectomy.  Continue follow-up with plastic surgery as scheduled. 5.  Chest wall mass: CT scan results from January 19, 2019 reviewed independently and reported as above with nonspecific 2.5 cm chest wall mass.  Subsequent PET scan on February 03, 2019 did not reveal any hypermetabolism.  No further interventions are needed.   6.  Bone health: Patient's most recent bone mineral density on November 16, 2018 reported T score of 0.8 which is considered normal.  Repeat in August 2021.  Patient expressed understanding and was in agreement with this plan. She also understands that She can call clinic at any time with any questions, concerns, or complaints.    Lloyd Huger, MD 12/19/19 10:23 PM

## 2019-12-23 ENCOUNTER — Inpatient Hospital Stay: Payer: Medicare Other | Admitting: Oncology

## 2019-12-24 NOTE — Progress Notes (Signed)
[place]  [phone] [phone]) [phone]  ID: Michelle Mcmillan OB: 20-Oct-1970  MR#: [id number]  ION#:[id number]  Patient Care Team: Donnie Coffin, MD as PCP - General (Family Medicine) Rico Junker, RN as Registered Nurse Theodore Demark, RN as Registered Nurse Lloyd Huger, MD as Consulting Physician (Oncology) Dillingham, Loel Lofty, DO as Attending Physician (Plastic Surgery)  I connected with Michelle Mcmillan on 12/30/19 at  2:15 PM EDT by video enabled telemedicine visit and verified that I am speaking with the correct person using two identifiers.   I discussed the limitations, risks, security and privacy concerns of performing an evaluation and management service by telemedicine and the availability of in-person appointments. I also discussed with the patient that there may be a patient responsible charge related to this service. The patient expressed understanding and agreed to proceed.   Other persons participating in the visit and their role in the encounter: Patient, MD.  Patient's location: Home. Provider's location: Clinic.  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   INTERVAL HISTORY: Patient agreed to video assisted telemedicine visit for her routine 7-monthevaluation.  She currently feels well and at her baseline.  She continues to have occasional hot flashes with letrozole, but otherwise is tolerating treatment well.  She does not complain of peripheral neuropathy or other neurologic complaints.  She denies any pain. She has a good appetite and denies weight loss.  She has no chest pain, shortness of breath, cough, or hemoptysis.  She denies any nausea, vomiting, constipation, or diarrhea.  She has no urinary complaints.  Patient offers further no specific complaints today.   REVIEW OF SYSTEMS:   Review of Systems  Constitutional: Negative.   Negative for diaphoresis, fever, malaise/fatigue and weight loss.  Respiratory: Negative.  Negative for cough and shortness of breath.   Cardiovascular: Negative.  Negative for chest pain and leg swelling.  Gastrointestinal: Negative.  Negative for abdominal pain, constipation, diarrhea, nausea and vomiting.  Genitourinary: Negative.  Negative for dysuria.  Musculoskeletal: Negative.  Negative for joint pain and myalgias.  Skin: Negative.  Negative for rash.  Neurological: Positive for sensory change. Negative for tingling, focal weakness and weakness.  Psychiatric/Behavioral: Negative.  The patient is not nervous/anxious and does not have insomnia.     As per HPI. Otherwise, a complete review of systems is negative.  PAST MEDICAL HISTORY: Past Medical History:  Diagnosis Date  . Anemia    H/O  . Anemia   . Arthritis    RIGHT KNEE  . Breast [place]   [place] breast  . [place]    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)  . Dyspnea   . GERD (gastroesophageal reflux disease)    NO MEDS  . Headache    migraines  . Hypertension   . Last menstrual period (LMP) > 10 days ago 08/2015  . Lower extremity edema   . Neuropathy   . Personal history of radiation therapy     PAST SURGICAL HISTORY: Past Surgical History:  Procedure Laterality Date  . BREAST BIOPSY Right 05/08/2017   invasive mamm. ca  . BREAST EXCISIONAL BIOPSY Right 06/15/15   breast cancer  . BREAST LUMPECTOMY WITH NEEDLE LOCALIZATION Right 06/15/2015   Procedure: BREAST LUMPECTOMY WITH NEEDLE LOCALIZATION;  Surgeon: CHubbard Robinson MD;  Location: [facility];  Service: General;  Laterality: Right;  . BREAST RECONSTRUCTION WITH PLACEMENT OF TISSUE EXPANDER AND  FLEX HD (ACELLULAR HYDRATED DERMIS) Left 02/09/2018   Procedure: BREAST RECONSTRUCTION WITH PLACEMENT OF TISSUE EXPANDER AND FLEX HD (ACELLULAR HYDRATED DERMIS);  Surgeon: Wallace Going, DO;  Location: [facility];  Service: Plastics;  Laterality: Left;  .  BREAST SURGERY    . CESAREAN SECTION  1988   x1  . COLONOSCOPY WITH PROPOFOL N/A 01/23/2017   Procedure: COLONOSCOPY WITH PROPOFOL;  Surgeon: Lin Landsman, MD;  Location[facility];  Service: Gastroenterology;  Laterality: N/A;  . ESOPHAGOGASTRODUODENOSCOPY (EGD) WITH PROPOFOL N/A 01/23/2017   Procedure: ESOPHAGOGASTRODUODENOSCOPY (EGD) WITH PROPOFOL;  Surgeon: Lin Landsman, MD;  Location: [facility] ENDOSCOPY;  Service: Gastroenterology;  Laterality: N/A;  . LATISSIMUS FLAP TO BREAST Right 06/01/2018   Procedure: RIGHT BREAST RECONSTRUCTION WITH LATISSIMUS MYOCUTANEOUS FLAP;  Surgeon: Wallace Going, DO;  Location: [place];  Service: Plastics;  Laterality: Right;  . LIPOSUCTION WITH LIPOFILLING Bilateral 10/15/2018   Procedure: LIPOSUCTION TO BILATERAL BREASTS;  Surgeon: Wallace Going, DO;  Location: [place];  Service: Plastics;  Laterality: Bilateral;  . LIPOSUCTION WITH LIPOFILLING Bilateral 04/22/2019   Procedure: fat grafting/liposuction and lipofilling bilateral breasts;  Surgeon: Wallace Going, DO;  Location: [place];  Service: Plastics;  Laterality: Bilateral;  . MASS EXCISION N/A 05/20/2016   Procedure: EXCISION CHEST WALL MASS;  Surgeon: Florene Glen, MD;  Location: [facility];  Service: General;  Laterality: N/A;  . RECONSTRUCTION BREAST W/ LATISSIMUS DORSI FLAP Right 06/01/2018  . REMOVAL OF BILATERAL TISSUE EXPANDERS WITH PLACEMENT OF BILATERAL BREAST IMPLANTS Bilateral 10/15/2018   Procedure: REMOVAL OF BILATERAL TISSUE EXPANDERS WITH PLACEMENT OF BILATERAL BREAST IMPLANTS;  Surgeon: Wallace Going, DO;  Location: [place];  Service: Plastics;  Laterality: Bilateral;  . SCAR REVISION Bilateral 04/22/2019   Procedure: Excision of bilateral scar contracture;  Surgeon: Wallace Going, DO;  Location: [place];  Service: Plastics;  Laterality: Bilateral;  total case 90 min  .  SENTINEL NODE BIOPSY Right 06/15/2015   Procedure: SENTINEL NODE BIOPSY;  Surgeon: Hubbard Robinson, MD;  Location: [facility];  Service: General;  Laterality: Right;  . TOTAL MASTECTOMY Right 05/22/2017   Procedure: TOTAL MASTECTOMY;  Surgeon: Florene Glen, MD;  Location: [facility];  Service: General;  Laterality: Right;  . TOTAL MASTECTOMY Left 02/09/2018   Procedure: LEFT PROPHYLACTIC  MASTECTOMY;  Surgeon: Jovita Kussmaul, MD;  Location: [facility];  Service: General;  Laterality: Left;  . TUBAL LIGATION  2004  . ULNAR NERVE TRANSPOSITION Left 01/29/2019   Procedure: LEFT ULNAR NERVE DECOMPRESSION/TRANSPOSITION;  Surgeon: Leanora Cover, MD;  Location: [place];  Service: Orthopedics;  Laterality: Left;    FAMILY HISTORY Family History  Problem Relation Age of Onset  . Hypertension Mother   . Hypertension Father   . Hypertension Sister   . Breast cancer Neg Hx        ADVANCED DIRECTIVES:    HEALTH MAINTENANCE: Social History   Tobacco Use  . Smoking status: Never Smoker  . Smokeless tobacco: Never Used  Vaping Use  . Vaping Use: Never used  Substance Use Topics  . Alcohol use: Not Currently    Comment: WINE ONCE MONTH  . Drug use: No    Allergies  Allergen Reactions  . Nitroglycerin Other (See Comments)    Headache, n/v.    Current Outpatient Medications  Medication Sig Dispense Refill  . BAC 50-325-40 MG tablet Take 1-2 tablets by mouth every 4 (  four) hours as needed.    Marland Kitchen buPROPion (WELLBUTRIN XL) 150 MG 24 hr tablet Take 150 mg by mouth daily.    . butalbital-aspirin-caffeine (FIORINAL) 50-325-40 MG capsule Take 1 capsule by mouth as needed for headache.    . calcium-vitamin D (OSCAL WITH D) 500-200 MG-UNIT TABS tablet Take 1 tablet by mouth 2 (two) times daily. With meals    . diclofenac sodium (VOLTAREN) 1 % GEL Apply 4 g topically 4 (four) times daily.    . dorzolamide-timolol (COSOPT) 22.3-6.8 MG/ML ophthalmic solution Place 1 drop into both  eyes every morning.    . Ferrous Sulfate (IRON) 325 (65 Fe) MG TABS Take 1 tablet (325 mg total) by mouth 2 (two) times daily. 60 each 2  . gabapentin (NEURONTIN) 300 MG capsule TAKE 1 CAPSULE BY MOUTH 3  TIMES DAILY AND MAY TAKE 2  CAPSULES AT BEDTIME IF MORE BOTHERSOME AT NIGHT 360 capsule 4  . ibuprofen (ADVIL) 800 MG tablet TAKE 1 TABLET BY MOUTH EVERY 8 HOURS WITH FOOD AS NEEDED FOR PAIN    . latanoprost (XALATAN) 0.005 % ophthalmic solution Place 1 drop into both eyes at bedtime.    Marland Kitchen letrozole (FEMARA) 2.5 MG tablet TAKE 1 TABLET BY MOUTH  DAILY 90 tablet 3  . omeprazole (PRILOSEC) 20 MG capsule Take 1 capsule (20 mg total) by mouth daily. 30 capsule 3  . Polyethyl Glycol-Propyl Glycol 0.4-0.3 % SOLN Apply to eye.    . potassium chloride SA (K-DUR,KLOR-CON) 20 MEQ tablet Take 1 tablet (20 mEq total) by mouth 2 (two) times daily. 30 tablet 1  . rizatriptan (MAXALT) 10 MG tablet SMARTSIG:1 Tablet(s) By Mouth 1 to 2 Times Daily    . tetrahydrozoline 0.05 % ophthalmic solution Place 2 drops into both eyes daily.     . traZODone (DESYREL) 50 MG tablet Take 50 mg by mouth at bedtime.    . triamcinolone (KENALOG) 0.1 % paste Use as directed 1 application in the mouth or throat 2 (two) times daily. 5 g 12   No current facility-administered medications for this visit.    OBJECTIVE: There were no vitals filed for this visit.   There is no height or weight on file to calculate BMI.    ECOG FS:0 - Asymptomatic  General: Well-developed, well-nourished, no acute distress. HEENT: Normocephalic. Neuro: Alert, answering all questions appropriately. Cranial nerves grossly intact. Psych: Normal affect.  LAB RESULTS:  Lab Results  Component Value Date   NA 141 01/25/2019   K 3.8 01/25/2019   CL 102 01/25/2019   CO2 30 01/25/2019   GLUCOSE 88 01/25/2019   BUN 18 01/25/2019   CREATININE 1.07 (H) 01/25/2019   CALCIUM 9.3 01/25/2019   PROT 6.9 10/21/2017   ALBUMIN 3.4 (L) 10/21/2017   AST 37  10/21/2017   ALT 48 (H) 10/21/2017   ALKPHOS 79 10/21/2017   BILITOT 0.4 10/21/2017   GFRNONAA >60 01/25/2019   GFRAA >60 01/25/2019    Lab Results  Component Value Date   WBC 7.9 06/02/2018   NEUTROABS 3.7 10/21/2017   HGB 9.5 (L) 06/02/2018   HCT 31.6 (L) 06/02/2018   MCV 77.6 (L) 06/02/2018   PLT 216 06/02/2018     STUDIES: DG Bone Density  Result Date: 12/01/2019 EXAM: DUAL X-RAY ABSORPTIOMETRY (DXA) FOR BONE MINERAL DENSITY IMPRESSION: Your patient Michelle Mcmillan completed a BMD test on 12/01/2019 using the [place] (software version: 14.10) manufactured by [employer]. The following summarizes the results  of our evaluation. Technologist: [facility] PATIENT BIOGRAPHICAL: Name: Michelle, Mcmillan Patient ID:  [id number] Birth Date: December 27, 1970 Height:     69.0 in. Gender: Female Exam Date: 12/01/2019 Weight:     320.4 lbs. Indications: High Risk Meds, History of Breast Cancer, History of Chemo, History of Radiation, Postmenopausal Fractures: Treatments: Calcium, Femara, Vitamin D DENSITOMETRY RESULTS: Site Region Measured Date Measured Age WHO classification Young Adult T-score BMD %Change vs. Previous  Significant Change (*) AP Spine L1-L4 12/01/2019 50.0 Normal 0.7 1.282 g/cm2 -14.5%                                   Yes AP Spine L1-L4 09/04/2016 46.8 Normal 2.5 1.499 g/cm2 - - DualFemur Neck Left 12/01/2019 50.0 Normal 0.9 1.158 g/cm2 -6.3% Yes DualFemur Neck Left 11/16/2018 49.0 Normal 1.4 1.236 g/cm2 -0.3% - DualFemur Neck Left 11/12/2017 48.0 Normal 1.5 1.240 g/cm2 0.3% - DualFemur Neck Left 09/04/2016 46.8 Normal 1.4 1.236 g/cm2 - - DualFemur Total Mean 12/01/2019 50.0 Normal 1.8 1.231 g/cm2 -1.9% - DualFemur Total Mean 11/16/2018 49.0 Normal 2.0 1.255 g/cm2 -2.4% - DualFemur Total Mean 11/12/2017 48.0 Normal 2.2 1.286 g/cm2 1.5% - DualFemur Total Mean 09/04/2016 46.8 Normal 2.1 1.267 g/cm2 - - ASSESSMENT: The BMD measured at AP Spine L1-L4 is 1.282 g/cm2 with a T-score  of 0.7. This patient is considered normal according to [place]) criteria. The scan quality is good. Compared with prior study, there has been significant decrease in the spine. Compared with prior study, there has been no significant change in the total hip. World Pharmacologist [facility]) criteria for post-menopausal, Caucasian Women: Normal:                   T-score at or above -1 SD Osteopenia/low bone mass: T-score between -1 and -2.5 SD Osteoporosis:             T-score at or below -2.5 SD RECOMMENDATIONS: 1. All patients should optimize calcium and vitamin D intake. 2. Consider FDA-approved medical therapies in postmenopausal women and men aged 36 years and older, based on the following: a. A hip or vertebral(clinical or morphometric) fracture b. T-score < -2.5 at the femoral neck or spine after appropriate evaluation to exclude secondary causes c. Low bone mass (T-score between -1.0 and -2.5 at the femoral neck or spine) and a 10-year probability of a hip fracture > 3% or a 10-year probability of a major osteoporosis-related fracture > 20% based on the [place]-adapted WHO algorithm 3. Clinician judgment and/or patient preferences may indicate treatment for people with 10-year fracture probabilities above or below these levels FOLLOW-UP: People with diagnosed cases of osteoporosis or at high risk for fracture should have regular bone mineral density tests. For patients eligible for Medicare, routine testing is allowed once every 2 years. The testing frequency can be increased to one year for patients who have rapidly progressing disease, those who are receiving or discontinuing medical therapy to restore bone mass, or have additional risk factors. I have reviewed this report, and agree with the above findings. Peggye Fothergill, MD [facility] Electronically Signed   By: Evangeline Dakin M.D.   On: 12/01/2019 20:31    ASSESSMENT:  Pathologic stage Ia ER/PR positive, HER-2  negative adenocarcinoma of the upper outer quadrant of the right breast. BRCA 1 and 2 negative, Oncotype DX score 17 which is considered low risk.  PLAN:  1. 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  This occurred while patient was taking Aromasin. Oncotype was not sent on her second breast cancer.  Previously, CT scans and bone scans reviewed independently with no evidence of metastatic disease confirming stage I malignancy.  Patient completed cycle 5 of Taxotere and Cytoxan on September 16, 2017.  She received 1 additional cycle secondary to a reaction she had during cycle 1 and did not complete treatment at that time.  She has now completed reconstruction.  Patient does not require additional mammograms given her bilateral mastectomy.  Continue letrozole for a minimum of 5 years through June 2024.  Return to clinic in 6 months with video assisted telemedicine visit.  2. Genetic testing: Patient was found to be BRCA 1 and 2 negative. She expressed understanding that although her genetic testing was negative, her children are at higher risk for breast cancer than the general population.   3.  Neuropathy: Patient does not complain of this today.  Continue gabapentin as prescribed. 4.  Chest wall mass: CT scan results from January 19, 2019 reviewed independently with a nonspecific 2.5 cm chest wall mass.  Subsequent PET scan on February 03, 2019 did not reveal any hypermetabolism.  No further interventions are needed.   6.  Bone health: Patient's most recent bone mineral density on December 01, 2019 reported T score of 0.7 which continues to be within normal limits.  Continue calcium and vitamin D supplementation.  Repeat in September 2023.  I provided 20 minutes of face-to-face video visit time during  this encounter which included chart review, counseling, and coordination of care as documented above.   Patient expressed understanding and was in agreement with this plan. She also understands that She can call clinic at any time with any questions, concerns, or complaints.    Lloyd Huger, MD 12/30/19 3:26 PM

## 2019-12-30 ENCOUNTER — Inpatient Hospital Stay: Payer: Medicare Other | Attending: Oncology | Admitting: Oncology

## 2019-12-30 DIAGNOSIS — Z170 Estrogen receptor positive status [ER+]: Secondary | ICD-10-CM | POA: Diagnosis not present

## 2019-12-30 DIAGNOSIS — C50411 Malignant neoplasm of upper-outer quadrant of right female breast: Secondary | ICD-10-CM | POA: Diagnosis not present

## 2019-12-30 NOTE — Progress Notes (Signed)
Patient denies any concerns. Just has questions about Bone Density Results.

## 2020-01-07 ENCOUNTER — Other Ambulatory Visit: Payer: Self-pay

## 2020-01-07 ENCOUNTER — Ambulatory Visit (INDEPENDENT_AMBULATORY_CARE_PROVIDER_SITE_OTHER): Payer: Medicare Other

## 2020-01-07 VITALS — BP 135/90 | HR 80 | Temp 98.2°F | Ht 67.0 in | Wt 306.0 lb

## 2020-01-07 DIAGNOSIS — Z9011 Acquired absence of right breast and nipple: Secondary | ICD-10-CM

## 2020-01-07 DIAGNOSIS — Z9889 Other specified postprocedural states: Secondary | ICD-10-CM | POA: Diagnosis not present

## 2020-02-10 NOTE — Progress Notes (Signed)
NIPPLE AREOLAR TATTOO PROCEDURE  PREOPERATIVE DIAGNOSIS:  Acquired absence of (BILATERAL) nipple areolar   POSTOPERATIVE DIAGNOSIS: Acquired absence of (BILATERAL) nipple areolar    PROCEDURES: (BILATERAL) nipple areolar tattoo touch-up  ATTENDING SURGEON: Dr. Lyndee Leo Dillingham  ANESTHESIA:  EMLA  COMPLICATIONS: None.  JUSTIFICATION FOR PROCEDURE:  Ms. Treloar is a 49 y.o. female with a history of breast cancer status post bilateral breast reconstruction.  The patient presents for nipple areolar complex tattoo touch-up.  Risks, benefits, indications, and alternatives of the above described procedures were discussed with the patient and all the patient's questions were answered.   DESCRIPTION OF PROCEDURE: After informed consent was obtained and proper identification of patient and surgical site was made, the patient was taken to the procedure room and  pre-procedure photos were obtained. Pt was placed supine on the operating room table. The patient was prepped and draped in the usual sterile fashion.  Using a # 7 tattoo head on the [employer] tattoo machine pigment was instilled to the designed nipple areolar complex.  Once adequate pigment had been applied to the nipple areolar complex, a post-procedure photo was taken. vaseline/ xeroform & gauze dressing was applied.  The patient tolerated the procedure well.  Post-procedure care was reviewed. World Famous Tattoo Ink used: Lot #'s and exp dates on file for ink Cool Mink/Cool Honey/[place]/Warm Honey Duration used as needed for pain.

## 2020-02-10 NOTE — Patient Instructions (Signed)
Pt will use vaseline/xeroform & gauze dressing for approx. 2 weeks Avoid sun/harsh cleansers/scrubs Call for any concerns F/U in  6 weeks for touch-up

## 2020-03-08 ENCOUNTER — Ambulatory Visit
Admission: RE | Admit: 2020-03-08 | Discharge: 2020-03-08 | Disposition: A | Discharge status: Home / Self Care | Payer: Medicare Other | Source: Ambulatory Visit | Attending: Family Medicine | Admitting: Family Medicine

## 2020-03-08 ENCOUNTER — Other Ambulatory Visit: Payer: Self-pay | Admitting: Family Medicine

## 2020-03-08 DIAGNOSIS — M940 Chondrocostal junction syndrome [Tietze]: Secondary | ICD-10-CM | POA: Insufficient documentation

## 2020-03-13 ENCOUNTER — Telehealth: Payer: Self-pay | Admitting: *Deleted

## 2020-03-13 ENCOUNTER — Telehealth: Payer: Self-pay

## 2020-03-13 ENCOUNTER — Inpatient Hospital Stay: Payer: Medicare Other | Attending: Nurse Practitioner | Admitting: Nurse Practitioner

## 2020-03-13 ENCOUNTER — Encounter: Payer: Self-pay | Admitting: Nurse Practitioner

## 2020-03-13 ENCOUNTER — Other Ambulatory Visit: Payer: Self-pay

## 2020-03-13 VITALS — BP 140/77 | HR 62 | Temp 98.0°F | Resp 20 | Wt 330.0 lb

## 2020-03-13 DIAGNOSIS — R0789 Other chest pain: Secondary | ICD-10-CM | POA: Insufficient documentation

## 2020-03-13 DIAGNOSIS — Z9013 Acquired absence of bilateral breasts and nipples: Secondary | ICD-10-CM | POA: Insufficient documentation

## 2020-03-13 DIAGNOSIS — Z9221 Personal history of antineoplastic chemotherapy: Secondary | ICD-10-CM | POA: Insufficient documentation

## 2020-03-13 DIAGNOSIS — Z170 Estrogen receptor positive status [ER+]: Secondary | ICD-10-CM | POA: Diagnosis not present

## 2020-03-13 DIAGNOSIS — Z79811 Long term (current) use of aromatase inhibitors: Secondary | ICD-10-CM | POA: Diagnosis not present

## 2020-03-13 DIAGNOSIS — C50411 Malignant neoplasm of upper-outer quadrant of right female breast: Secondary | ICD-10-CM | POA: Diagnosis not present

## 2020-03-13 NOTE — Telephone Encounter (Signed)
Patient called reporting that she is having pain in her right chest area where her cancer was and she wants it evaluated. Please advise

## 2020-03-13 NOTE — Progress Notes (Signed)
Symptom Management [place]  [phone] [phone]) [phone]  Patient Care Team: Donnie Coffin, MD as PCP - General (Family Medicine) Rico Junker, RN as Registered Nurse Theodore Demark, RN as Registered Nurse Lloyd Huger, MD as Consulting Physician (Oncology) Dillingham, Loel Lofty, DO as Attending Physician (Plastic Surgery)   Name of the patient: Michelle Mcmillan  [id number]  22-Oct-1970   Date of visit: 03/13/20  Diagnosis-   Chief complaint/ Reason for visit-right chest wall pain  Heme/Onc history:  Right Breast Cancer - 05/10/2015- 10:00. ER/PR positive, HER2 negative. Underwent lumpectomy followed by radiation. Started on Aromasin.   05/08/2017-second primary right breast cancer ER/PR positive HER-2/neu negative.  Underwent bilateral mastectomy with reconstruction. Received adjuvant chemotherapy followed by letrozole.  Oncology History   No history exists.    Interval history- Michelle Mcmillan, 49 year old female with above history of breast cancer status postmastectomy with reconstruction presents to symptom management clinic for concerns of right chest wall pain.  Pain started in October and has progressively worsened since that time.  Localizes to deep right chest.  Describes as tight, pulling, sharp, aching and throbbing.  Pain exacerbated by movement and interrupt sleep.  She has been taking ibuprofen without improvement.  Saw her PCP who performed x-rays which were negative by her report and prescribed steroids without symptom improvement.  Her PCP recommended that she contact her oncologist.  Otherwise feels well.  She continues letrozole.  No headaches or weakness.  No fevers or hot flashes.   ECOG FS:1 - Symptomatic but completely ambulatory  Review of systems- Review of Systems  Constitutional: Negative for chills, fever, malaise/fatigue and weight loss.  HENT: Negative for hearing loss, nosebleeds, sore throat and  tinnitus.   Eyes: Negative for blurred vision and double vision.  Respiratory: Negative for cough, hemoptysis, shortness of breath and wheezing.   Cardiovascular: Positive for chest pain. Negative for palpitations and leg swelling.  Gastrointestinal: Negative for abdominal pain, blood in stool, constipation, diarrhea, melena, nausea and vomiting.  Genitourinary: Negative for dysuria and urgency.  Musculoskeletal: Negative for back pain, falls, joint pain and myalgias.  Skin: Negative for itching and rash.  Neurological: Negative for dizziness, tingling, sensory change, loss of consciousness, weakness and headaches.  Endo/Heme/Allergies: Negative for environmental allergies. Does not bruise/bleed easily.  Psychiatric/Behavioral: Negative for depression. The patient is not nervous/anxious and does not have insomnia.      Current treatment-letrozole  Allergies  Allergen Reactions  . Nitroglycerin Other (See Comments)    Headache, n/v.    Past Medical History:  Diagnosis Date  . Anemia    H/O  . Anemia   . Arthritis    RIGHT KNEE  . Breast cancer ([place]) 2017   right breast  . Cancer ([place])    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)  . Dyspnea   . GERD (gastroesophageal reflux disease)    NO MEDS  . Headache    migraines  . Hypertension   . Last menstrual period (LMP) > 10 days ago 08/2015  . Lower extremity edema   . Neuropathy   . Personal history of radiation therapy     Past Surgical History:  Procedure Laterality Date  . BREAST BIOPSY Right 05/08/2017   invasive mamm. ca  . BREAST EXCISIONAL BIOPSY Right 06/15/15   breast cancer  . BREAST LUMPECTOMY WITH NEEDLE LOCALIZATION Right 06/15/2015   Procedure: BREAST LUMPECTOMY WITH NEEDLE LOCALIZATION;  Surgeon: Hubbard Robinson, MD;  Location: [facility];  Service:  General;  Laterality: Right;  . BREAST RECONSTRUCTION WITH PLACEMENT OF TISSUE EXPANDER AND FLEX HD (ACELLULAR HYDRATED DERMIS) Left 02/09/2018   Procedure: BREAST  RECONSTRUCTION WITH PLACEMENT OF TISSUE EXPANDER AND FLEX HD (ACELLULAR HYDRATED DERMIS);  Surgeon: Wallace Going, DO;  Location: [facility];  Service: Plastics;  Laterality: Left;  . BREAST SURGERY    . CESAREAN SECTION  1988   x1  . COLONOSCOPY WITH PROPOFOL N/A 01/23/2017   Procedure: COLONOSCOPY WITH PROPOFOL;  Surgeon: Lin Landsman, MD;  Location: [facility] END[facility];  Service: Gastroenterology;  Laterality: N/A;  . ESOPHAGOGASTRODUODENOSCOPY (EGD) WITH PROPOFOL N/A 01/23/2017   Procedure: ESOPHAGOGASTRODUODENOSCOPY (EGD) WITH PROPOFOL;  Surgeon: Lin Landsman, MD;  Location: [facility] ENDOSCOPY;  Service: Gastroenterology;  Laterality: N/A;  . LATISSIMUS FLAP TO BREAST Right 06/01/2018   Procedure: RIGHT BREAST RECONSTRUCTION WITH LATISSIMUS MYOCUTANEOUS FLAP;  Surgeon: Wallace Going, DO;  Location: [place];  Service: Plastics;  Laterality: Right;  . LIPOSUCTION WITH LIPOFILLING Bilateral 10/15/2018   Procedure: LIPOSUCTION TO BILATERAL BREASTS;  Surgeon: Wallace Going, DO;  Location: [place];  Service: Plastics;  Laterality: Bilateral;  . LIPOSUCTION WITH LIPOFILLING Bilateral 04/22/2019   Procedure: fat grafting/liposuction and lipofilling bilateral breasts;  Surgeon: Wallace Going, DO;  Location: [place];  Service: Plastics;  Laterality: Bilateral;  . MASS EXCISION N/A 05/20/2016   Procedure: EXCISION CHEST WALL MASS;  Surgeon: Florene Glen, MD;  Location: [facility];  Service: General;  Laterality: N/A;  . RECONSTRUCTION BREAST W/ LATISSIMUS DORSI FLAP Right 06/01/2018  . REMOVAL OF BILATERAL TISSUE EXPANDERS WITH PLACEMENT OF BILATERAL BREAST IMPLANTS Bilateral 10/15/2018   Procedure: REMOVAL OF BILATERAL TISSUE EXPANDERS WITH PLACEMENT OF BILATERAL BREAST IMPLANTS;  Surgeon: Wallace Going, DO;  Location: [place];  Service: Plastics;  Laterality: Bilateral;  . SCAR REVISION Bilateral 04/22/2019    Procedure: Excision of bilateral scar contracture;  Surgeon: Wallace Going, DO;  Location: [place];  Service: Plastics;  Laterality: Bilateral;  total case 90 min  . SENTINEL NODE BIOPSY Right 06/15/2015   Procedure: SENTINEL NODE BIOPSY;  Surgeon: Hubbard Robinson, MD;  Location: [facility];  Service: General;  Laterality: Right;  . TOTAL MASTECTOMY Right 05/22/2017   Procedure: TOTAL MASTECTOMY;  Surgeon: Florene Glen, MD;  Location: [facility];  Service: General;  Laterality: Right;  . TOTAL MASTECTOMY Left 02/09/2018   Procedure: LEFT PROPHYLACTIC  MASTECTOMY;  Surgeon: Jovita Kussmaul, MD;  Location: [facility];  Service: General;  Laterality: Left;  . TUBAL LIGATION  2004  . ULNAR NERVE TRANSPOSITION Left 01/29/2019   Procedure: LEFT ULNAR NERVE DECOMPRESSION/TRANSPOSITION;  Surgeon: Leanora Cover, MD;  Location: [place];  Service: Orthopedics;  Laterality: Left;    Social History   Socioeconomic History  . Marital status: Married    Spouse name: Not on file  . Number of children: Not on file  . Years of education: Not on file  . Highest education level: Not on file  Occupational History  . Not on file  Tobacco Use  . Smoking status: Never Smoker  . Smokeless tobacco: Never Used  Vaping Use  . Vaping Use: Never used  Substance and Sexual Activity  . Alcohol use: Not Currently    Comment: WINE ONCE MONTH  . Drug use: No  . Sexual activity: Not on file  Other Topics Concern  . Not on file  Social History Narrative  . Not on  file   Social Determinants of Health   Financial Resource Strain: Not on file  Food Insecurity: Not on file  Transportation Needs: Not on file  Physical Activity: Not on file  Stress: Not on file  Social Connections: Not on file  Intimate Partner Violence: Not on file    Family History  Problem Relation Age of Onset  . Hypertension Mother   . Hypertension Father   . Hypertension Sister   . Breast  cancer Neg Hx      Current Outpatient Medications:  .  BAC 50-325-40 MG tablet, Take 1-2 tablets by mouth every 4 (four) hours as needed., Disp: , Rfl:  .  buPROPion (WELLBUTRIN XL) 150 MG 24 hr tablet, Take 150 mg by mouth daily., Disp: , Rfl:  .  butalbital-aspirin-caffeine (FIORINAL) 50-325-40 MG capsule, Take 1 capsule by mouth as needed for headache., Disp: , Rfl:  .  calcium-vitamin D (OSCAL WITH D) 500-200 MG-UNIT TABS tablet, Take 1 tablet by mouth 2 (two) times daily. With meals, Disp: , Rfl:  .  diclofenac sodium (VOLTAREN) 1 % GEL, Apply 4 g topically 4 (four) times daily., Disp: , Rfl:  .  dorzolamide-timolol (COSOPT) 22.3-6.8 MG/ML ophthalmic solution, Place 1 drop into both eyes every morning., Disp: , Rfl:  .  Ferrous Sulfate (IRON) 325 (65 Fe) MG TABS, Take 1 tablet (325 mg total) by mouth 2 (two) times daily., Disp: 60 each, Rfl: 2 .  gabapentin (NEURONTIN) 300 MG capsule, TAKE 1 CAPSULE BY MOUTH 3  TIMES DAILY AND MAY TAKE 2  CAPSULES AT BEDTIME IF MORE BOTHERSOME AT NIGHT, Disp: 360 capsule, Rfl: 4 .  ibuprofen (ADVIL) 800 MG tablet, TAKE 1 TABLET BY MOUTH EVERY 8 HOURS WITH FOOD AS NEEDED FOR PAIN, Disp: , Rfl:  .  latanoprost (XALATAN) 0.005 % ophthalmic solution, Place 1 drop into both eyes at bedtime., Disp: , Rfl:  .  letrozole (FEMARA) 2.5 MG tablet, TAKE 1 TABLET BY MOUTH  DAILY, Disp: 90 tablet, Rfl: 3 .  omeprazole (PRILOSEC) 20 MG capsule, Take 1 capsule (20 mg total) by mouth daily., Disp: 30 capsule, Rfl: 3 .  Polyethyl Glycol-Propyl Glycol 0.4-0.3 % SOLN, Apply to eye., Disp: , Rfl:  .  potassium chloride SA (K-DUR,KLOR-CON) 20 MEQ tablet, Take 1 tablet (20 mEq total) by mouth 2 (two) times daily., Disp: 30 tablet, Rfl: 1 .  rizatriptan (MAXALT) 10 MG tablet, SMARTSIG:1 Tablet(s) By Mouth 1 to 2 Times Daily, Disp: , Rfl:  .  traZODone (DESYREL) 50 MG tablet, Take 50 mg by mouth at bedtime., Disp: , Rfl:  .  tetrahydrozoline 0.05 % ophthalmic solution, Place 2  drops into both eyes daily.  (Patient not taking: Reported on 03/13/2020), Disp: , Rfl:   Physical exam:  Vitals:   03/13/20 1444  BP: 140/77  Pulse: 62  Resp: 20  Temp: 98 F (36.7 C)  TempSrc: Tympanic  Weight: (!) 330 lb (149.7 kg)   Physical Exam Constitutional:      General: She is not in acute distress.    Comments: Accompanied  HENT:     Head: Normocephalic and atraumatic.  Pulmonary:     Effort: Pulmonary effort is normal.     Breath sounds: No wheezing.  Chest:     Comments: S/p mastectomy w/ reconstruction. Pain reproducible at 1:00, ~5 cm from nipple, deep. Tissue thickening palpated but no discrete mass. Exam limited due to post operative changes.  Pain also at post operative incision in right axilla.  No palpable masses.  Neurological:     Mental Status: She is alert.      CMP Latest Ref Rng & Units 01/25/2019  Glucose 70 - 99 mg/dL 88  BUN 6 - 20 mg/dL 18  Creatinine 0.44 - 1.00 mg/dL 1.07(H)  Sodium 135 - 145 mmol/L 141  Potassium 3.5 - 5.1 mmol/L 3.8  Chloride 98 - 111 mmol/L 102  CO2 22 - 32 mmol/L 30  Calcium 8.9 - 10.3 mg/dL 9.3  Total Protein 6.5 - 8.1 g/dL -  Total Bilirubin 0.3 - 1.2 mg/dL -  Alkaline Phos 38 - 126 U/L -  AST 15 - 41 U/L -  ALT 0 - 44 U/L -   CBC Latest Ref Rng & Units 06/02/2018  WBC 4.0 - 10.5 K/uL 7.9  Hemoglobin 12.0 - 15.0 g/dL 9.5(L)  Hematocrit 36.0 - 46.0 % 31.6(L)  Platelets 150 - 400 K/uL 216    No images are attached to the encounter.  DG Ribs Unilateral Right  Result Date: 03/09/2020 CLINICAL DATA:  Rib pain.  History of breast cancer EXAM: RIGHT RIBS - 2 VIEW; LEFT RIBS AND CHEST - 3+ VIEW COMPARISON:  CT 01/19/2019 FINDINGS: No fracture or other bone lesions are seen involving the bilateral ribs. Heart size is normal. Lungs are clear. Surgical clips within the bilateral chest wall soft tissues. IMPRESSION: Negative. Electronically Signed   By: Davina Poke D.O.   On: 03/09/2020 14:58   DG Ribs Unilateral  W/Chest Left  Result Date: 03/09/2020 CLINICAL DATA:  Rib pain.  History of breast cancer EXAM: RIGHT RIBS - 2 VIEW; LEFT RIBS AND CHEST - 3+ VIEW COMPARISON:  CT 01/19/2019 FINDINGS: No fracture or other bone lesions are seen involving the bilateral ribs. Heart size is normal. Lungs are clear. Surgical clips within the bilateral chest wall soft tissues. IMPRESSION: Negative. Electronically Signed   By: Davina Poke D.O.   On: 03/09/2020 14:58    Assessment and plan- Patient is a 49 y.o. 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  She had a second primary breast cancer while on Aromasin.  She presents to symptom management clinic for complaints of right chest wall pain.  1.  Chest wall pain-etiology unclear.  Pain reproducible.  No discernible mass but exam limited due to reconstruction. Recommend imaging to evaluate further. Recent negative xrays. Discussed recommendation for imaging with Radiology who recommends MRI Chest with and without contrast given patient's history of mastectomy and reconstruction to evaluate for possible chest wall recurrence vs post-operative changes vs other. In the interim, alternate tylenol and ibuprofen for pain.   Patient advised to notify the clinic if there is no improvement in symptoms or if symptoms worsen. Follow up based on results.    Visit Diagnosis 1. Right-sided chest wall pain   2. Malignant neoplasm of upper-outer quadrant of right breast in female, estrogen receptor positive ([place])     Patient expressed understanding and was in agreement with this plan. She also understands that She can call clinic at any time with any questions, concerns, or complaints.   Thank you for allowing me to participate in the care of this very pleasant patient.   Beckey Rutter,  DNP, AGNP-C [place] [facility] [place]

## 2020-03-13 NOTE — Telephone Encounter (Signed)
Patient is experiencing pain in her right chest area.  Her doctor saw surgical clips showing up on an x-ray on each side.  Patient would like to talk with someone about this.  Please call.

## 2020-03-13 NOTE — Progress Notes (Signed)
Patient here today for acute add on regarding right chest, breast pain. Patient had xrays performed by pcp, normal per patient report. She was also given steroids that did not improve pain. PCP recommended follow up with oncology.

## 2020-03-13 NOTE — Telephone Encounter (Signed)
I can see her in Symptom Management

## 2020-03-13 NOTE — Telephone Encounter (Signed)
Symptom Management Clinic Nurse is supposed to call and schedule patients for clinic

## 2020-03-13 NOTE — Telephone Encounter (Signed)
Michelle Mcmillan, are you going to call her and have her come in?

## 2020-03-16 NOTE — Telephone Encounter (Signed)
Called and spoke with the patient on (03/14/20) regarding her message below.  She stated again that she have been having chest pain since around Oct and the pain has gotten worse.  Her primary care doctor did a chest x-ray and it showed the surgical clips.   She stated that her primary doctor and herself thought that after having the double mastectomy everything was taking out.  Patient stated that her primary doctor wants to do a MRI but was concerned about the clips.    Called the patient back and informed her that I spoke with [name] regarding her message and she stated that the clips were placed in by her Gen Surgeon and they are to stay in.  The clips were put in to help stop the bleeding.  The clips will never come out.   Informed the patient that if she wants a little more information on it she can reach out to her Archivist.  Patient verbalized understanding and agreed.//AB/CMA

## 2020-03-20 ENCOUNTER — Telehealth: Payer: Self-pay | Admitting: Oncology

## 2020-03-20 NOTE — Telephone Encounter (Signed)
Left pt VM that MRI had to canceled due to insurance authorization. Informed pt that follow-up w\ Dr. Grayland Ormond would be rescheduled once scan was approved. Informed to come to scheduled appt on 12/22 and 12/30.

## 2020-03-22 ENCOUNTER — Other Ambulatory Visit: Payer: Self-pay

## 2020-03-22 ENCOUNTER — Ambulatory Visit
Admission: RE | Admit: 2020-03-22 | Discharge: 2020-03-22 | Disposition: A | Discharge status: Home / Self Care | Payer: Medicare Other | Source: Ambulatory Visit | Attending: Nurse Practitioner | Admitting: Nurse Practitioner

## 2020-03-22 ENCOUNTER — Ambulatory Visit: Payer: Medicare Other

## 2020-03-22 DIAGNOSIS — R0789 Other chest pain: Secondary | ICD-10-CM | POA: Insufficient documentation

## 2020-03-22 MED ORDER — GADOBUTROL 1 MMOL/ML IV SOLN
10.0000 mL | Freq: Once | INTRAVENOUS | Status: AC | PRN
  Administered 2020-03-22: 10:00:00 10 mL via INTRAVENOUS

## 2020-03-26 NOTE — Progress Notes (Deleted)
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   INTERVAL HISTORY: Patient agreed to video assisted telemedicine visit for her routine 58-monthevaluation.  She currently feels well and at her baseline.  She continues to have occasional hot flashes with letrozole, but otherwise is tolerating treatment well.  She does not complain of peripheral neuropathy or other neurologic complaints.  She denies any pain. She has a good appetite and denies weight loss.  She has no chest pain, shortness of breath, cough, or hemoptysis.  She denies any nausea, vomiting, constipation, or diarrhea.  She has no urinary complaints.  Patient offers further no specific complaints today.   REVIEW OF SYSTEMS:   Review of Systems  Constitutional: Negative.  Negative for diaphoresis, fever, malaise/fatigue and weight loss.  Respiratory: Negative.  Negative for cough and shortness of breath.   Cardiovascular: Negative.  Negative for chest pain and leg swelling.  Gastrointestinal: Negative.  Negative for abdominal pain, constipation, diarrhea, nausea and vomiting.  Genitourinary: Negative.  Negative for dysuria.  Musculoskeletal: Negative.  Negative for joint pain and myalgias.  Skin: Negative.  Negative for rash.  Neurological: Positive for sensory change. Negative for tingling, focal weakness and weakness.  Psychiatric/Behavioral: Negative.  The patient is not nervous/anxious and does not have insomnia.     As  per HPI. Otherwise, a complete review of systems is negative.  PAST MEDICAL HISTORY: Past Medical History:  Diagnosis Date  . Anemia    H/O  . Anemia   . Arthritis    RIGHT KNEE  . [place]   [place] breast  . [place]    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)  . Dyspnea   . GERD (gastroesophageal reflux disease)    NO MEDS  . Headache    migraines  . Hypertension   . Last menstrual period (LMP) > 10 days ago 08/2015  . Lower extremity edema   . Neuropathy   . Personal history of radiation therapy     PAST SURGICAL HISTORY: Past Surgical History:  Procedure Laterality Date  . BREAST BIOPSY Right 05/08/2017   invasive mamm. ca  . BREAST EXCISIONAL BIOPSY Right 06/15/15   breast cancer  . BREAST LUMPECTOMY WITH NEEDLE LOCALIZATION Right 06/15/2015   Procedure: BREAST LUMPECTOMY WITH NEEDLE LOCALIZATION;  Surgeon: CHubbard Robinson MD;  Location: [facility];  Service: General;  Laterality: Right;  . BREAST RECONSTRUCTION WITH PLACEMENT OF TISSUE EXPANDER AND FLEX HD (ACELLULAR HYDRATED DERMIS) Left 02/09/2018   Procedure: BREAST RECONSTRUCTION WITH PLACEMENT OF TISSUE EXPANDER AND FLEX HD (ACELLULAR HYDRATED DERMIS);  Surgeon: DWallace Going DO;  Location: [facility];  Service: Plastics;  Laterality: Left;  . BREAST SURGERY    . CESAREAN SECTION  1988   x1  . COLONOSCOPY WITH PROPOFOL N/A 01/23/2017   Procedure: COLONOSCOPY WITH PROPOFOL;  Surgeon: VLin Landsman MD;  [facility];  Service: Gastroenterology;  Laterality: N/A;  . ESOPHAGOGASTRODUODENOSCOPY (EGD) WITH PROPOFOL N/A 01/23/2017   Procedure: ESOPHAGOGASTRODUODENOSCOPY (EGD) WITH PROPOFOL;  Surgeon: VLin Landsman MD;  Location: [facility];  Service: Gastroenterology;  Laterality: N/A;  . LATISSIMUS FLAP TO BREAST Right 06/01/2018   Procedure: RIGHT BREAST RECONSTRUCTION WITH LATISSIMUS MYOCUTANEOUS FLAP;  Surgeon: Wallace Going, DO;  Location: [place];  Service: Plastics;   Laterality: Right;  . LIPOSUCTION WITH LIPOFILLING Bilateral 10/15/2018   Procedure: LIPOSUCTION TO BILATERAL BREASTS;  Surgeon: Wallace Going, DO;  Location: [place];  Service: Plastics;  Laterality: Bilateral;  . LIPOSUCTION WITH LIPOFILLING Bilateral 04/22/2019   Procedure: fat grafting/liposuction and lipofilling bilateral breasts;  Surgeon: Wallace Going, DO;  Location: [place];  Service: Plastics;  Laterality: Bilateral;  . MASS EXCISION N/A 05/20/2016   Procedure: EXCISION CHEST WALL MASS;  Surgeon: Florene Glen, MD;  Location: [facility];  Service: General;  Laterality: N/A;  . RECONSTRUCTION BREAST W/ LATISSIMUS DORSI FLAP Right 06/01/2018  . REMOVAL OF BILATERAL TISSUE EXPANDERS WITH PLACEMENT OF BILATERAL BREAST IMPLANTS Bilateral 10/15/2018   Procedure: REMOVAL OF BILATERAL TISSUE EXPANDERS WITH PLACEMENT OF BILATERAL BREAST IMPLANTS;  Surgeon: Wallace Going, DO;  Location: [place];  Service: Plastics;  Laterality: Bilateral;  . SCAR REVISION Bilateral 04/22/2019   Procedure: Excision of bilateral scar contracture;  Surgeon: Wallace Going, DO;  Location: [place];  Service: Plastics;  Laterality: Bilateral;  total case 90 min  . SENTINEL NODE BIOPSY Right 06/15/2015   Procedure: SENTINEL NODE BIOPSY;  Surgeon: Hubbard Robinson, MD;  Location: [facility];  Service: General;  Laterality: Right;  . TOTAL MASTECTOMY Right 05/22/2017   Procedure: TOTAL MASTECTOMY;  Surgeon: Florene Glen, MD;  Location: [facility];  Service: General;  Laterality: Right;  . TOTAL MASTECTOMY Left 02/09/2018   Procedure: LEFT PROPHYLACTIC  MASTECTOMY;  Surgeon: Jovita Kussmaul, MD;  Location: [facility];  Service: General;  Laterality: Left;  . TUBAL LIGATION  2004  . ULNAR NERVE TRANSPOSITION Left 01/29/2019   Procedure: LEFT ULNAR NERVE DECOMPRESSION/TRANSPOSITION;  Surgeon: Leanora Cover, MD;  Location: [place];  Service: Orthopedics;  Laterality: Left;    FAMILY HISTORY Family History  Problem Relation Age of Onset  . Hypertension Mother   . Hypertension Father   . Hypertension Sister   . Breast cancer Neg Hx        ADVANCED DIRECTIVES:    HEALTH MAINTENANCE: Social History   Tobacco Use  . Smoking status: Never Smoker  . Smokeless tobacco: Never Used  Vaping Use  . Vaping Use: Never used  Substance Use Topics  . Alcohol use: Not Currently    Comment: WINE ONCE MONTH  . Drug use: No    Allergies  Allergen Reactions  . Nitroglycerin Other (See Comments)    Headache, n/v.    Current Outpatient Medications  Medication Sig Dispense Refill  . BAC 50-325-40 MG tablet Take 1-2 tablets by mouth every 4 (four) hours as needed.    Marland Kitchen buPROPion (WELLBUTRIN XL) 150 MG 24 hr tablet Take 150 mg by mouth daily.    . butalbital-aspirin-caffeine (FIORINAL) 50-325-40 MG capsule Take 1 capsule by mouth as needed for headache.    . calcium-vitamin D (OSCAL WITH D) 500-200 MG-UNIT TABS tablet Take 1 tablet by mouth 2 (two) times daily. With meals    . diclofenac sodium (VOLTAREN) 1 % GEL Apply 4 g topically 4 (four) times daily.    . dorzolamide-timolol (COSOPT) 22.3-6.8 MG/ML ophthalmic solution Place 1 drop into both eyes every morning.    . Ferrous  Sulfate (IRON) 325 (65 Fe) MG TABS Take 1 tablet (325 mg total) by mouth 2 (two) times daily. 60 each 2  . gabapentin (NEURONTIN) 300 MG capsule TAKE 1 CAPSULE BY MOUTH 3  TIMES DAILY AND MAY TAKE 2  CAPSULES AT BEDTIME IF MORE BOTHERSOME AT NIGHT 360 capsule 4  . ibuprofen (ADVIL) 800 MG tablet TAKE 1 TABLET BY MOUTH EVERY 8 HOURS WITH FOOD AS NEEDED FOR PAIN    . latanoprost (XALATAN) 0.005 % ophthalmic solution Place 1 drop into both eyes at bedtime.    Marland Kitchen letrozole (FEMARA) 2.5 MG tablet TAKE 1 TABLET BY MOUTH  DAILY 90 tablet 3  . omeprazole (PRILOSEC) 20 MG capsule Take 1 capsule (20 mg total) by mouth daily. 30 capsule 3  .  Polyethyl Glycol-Propyl Glycol 0.4-0.3 % SOLN Apply to eye.    . potassium chloride SA (K-DUR,KLOR-CON) 20 MEQ tablet Take 1 tablet (20 mEq total) by mouth 2 (two) times daily. 30 tablet 1  . rizatriptan (MAXALT) 10 MG tablet SMARTSIG:1 Tablet(s) By Mouth 1 to 2 Times Daily    . tetrahydrozoline 0.05 % ophthalmic solution Place 2 drops into both eyes daily.  (Patient not taking: Reported on 03/13/2020)    . traZODone (DESYREL) 50 MG tablet Take 50 mg by mouth at bedtime.     No current facility-administered medications for this visit.    OBJECTIVE: There were no vitals filed for this visit.   There is no height or weight on file to calculate BMI.    ECOG FS:0 - Asymptomatic  General: Well-developed, well-nourished, no acute distress. HEENT: Normocephalic. Neuro: Alert, answering all questions appropriately. Cranial nerves grossly intact. Psych: Normal affect.  LAB RESULTS:  Lab Results  Component Value Date   NA 141 01/25/2019   K 3.8 01/25/2019   CL 102 01/25/2019   CO2 30 01/25/2019   GLUCOSE 88 01/25/2019   BUN 18 01/25/2019   CREATININE 1.07 (H) 01/25/2019   CALCIUM 9.3 01/25/2019   PROT 6.9 10/21/2017   ALBUMIN 3.4 (L) 10/21/2017   AST 37 10/21/2017   ALT 48 (H) 10/21/2017   ALKPHOS 79 10/21/2017   BILITOT 0.4 10/21/2017   GFRNONAA >60 01/25/2019   GFRAA >60 01/25/2019    Lab Results  Component Value Date   WBC 7.9 06/02/2018   NEUTROABS 3.7 10/21/2017   HGB 9.5 (L) 06/02/2018   HCT 31.6 (L) 06/02/2018   MCV 77.6 (L) 06/02/2018   PLT 216 06/02/2018     STUDIES: DG Ribs Unilateral Right  Result Date: 03/09/2020 CLINICAL DATA:  Rib pain.  History of breast cancer EXAM: RIGHT RIBS - 2 VIEW; LEFT RIBS AND CHEST - 3+ VIEW COMPARISON:  CT 01/19/2019 FINDINGS: No fracture or other bone lesions are seen involving the bilateral ribs. Heart size is normal. Lungs are clear. Surgical clips within the bilateral chest wall soft tissues. IMPRESSION: Negative. Electronically  Signed   By: Davina Poke D.O.   On: 03/09/2020 14:58   DG Ribs Unilateral W/Chest Left  Result Date: 03/09/2020 CLINICAL DATA:  Rib pain.  History of breast cancer EXAM: RIGHT RIBS - 2 VIEW; LEFT RIBS AND CHEST - 3+ VIEW COMPARISON:  CT 01/19/2019 FINDINGS: No fracture or other bone lesions are seen involving the bilateral ribs. Heart size is normal. Lungs are clear. Surgical clips within the bilateral chest wall soft tissues. IMPRESSION: Negative. Electronically Signed   By: Davina Poke D.O.   On: 03/09/2020 14:58   MR CHEST W WO CONTRAST  Result Date: 03/22/2020 CLINICAL DATA:  Hervey Ard right chest wall pain for the past 2 months. No injury. History of breast cancer with reconstruction in 2019-2020. EXAM: MR CHEST WITH AND WITHOUT CONTRAST TECHNIQUE: Multiplanar, multisequence MR imaging of the right chest was performed before and after the administration of intravenous contrast. CONTRAST:  48m GADAVIST GADOBUTROL 1 MMOL/ML IV SOLN COMPARISON:  PET-CT dated February 03, 2019. CT chest dated January 19, 2019. FINDINGS: Bones/Joint/Cartilage No marrow signal abnormality.  No glenohumeral joint effusion. Muscles and Tendons Intact.  No muscle edema or atrophy. Soft tissue Prior right mastectomy with retropectoral silicone implant reconstruction. No abnormal enhancement or soft tissue mass to suggest recurrence. No enlarged axillary lymph nodes. No fluid collection or hematoma. Few subcentimeter cysts in the liver. IMPRESSION: 1. Prior right mastectomy with implant reconstruction. No evidence of recurrence. No explanation for the patient's pain. Electronically Signed   By: WTitus DubinM.D.   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 BRCA 1 and 2 negative, Oncotype DX score 17 which is considered low risk.  PLAN:    1. Pathologic stage Ia ER/PR positive, HER-2 negative adenocarcinoma  of the upper outer quadrant of the right breast: Pathology from mastectomy on May 22, 2017 indicates that this was most likely a second primary and not a local recurrence of disease.  This occurred while patient was taking Aromasin. Oncotype was not sent on her second breast cancer.  Previously, CT scans and bone scans reviewed independently with no evidence of metastatic disease confirming stage I malignancy.  Patient completed cycle 5 of Taxotere and Cytoxan on September 16, 2017.  She received 1 additional cycle secondary to a reaction she had during cycle 1 and did not complete treatment at that time.  She has now completed reconstruction.  Patient does not require additional mammograms given her bilateral mastectomy.  Continue letrozole for a minimum of 5 years through June 2024.  Return to clinic in 6 months with video assisted telemedicine visit.  2. Genetic testing: Patient was found to be BRCA 1 and 2 negative. She expressed understanding that although her genetic testing was negative, her children are at higher risk for breast cancer than the general population.   3.  Neuropathy: Patient does not complain of this today.  Continue gabapentin as prescribed. 4.  Chest wall mass: CT scan results from January 19, 2019 reviewed independently with a nonspecific 2.5 cm chest wall mass.  Subsequent PET scan on February 03, 2019 did not reveal any hypermetabolism.  No further interventions are needed.   6.  Bone health: Patient's most recent bone mineral density on December 01, 2019 reported T score of 0.7 which continues to be within normal limits.  Continue calcium and vitamin D supplementation.  Repeat in September 2023.    Patient expressed understanding and was in agreement with this plan. She also understands that She can call clinic at any time with any questions, concerns, or complaints.    TLloyd Huger MD 03/26/20 9:57 AM

## 2020-03-30 ENCOUNTER — Inpatient Hospital Stay: Payer: Medicare Other | Admitting: Oncology

## 2020-03-30 ENCOUNTER — Ambulatory Visit: Payer: Medicare Other | Admitting: Oncology

## 2020-04-03 ENCOUNTER — Ambulatory Visit: Payer: Medicare Other

## 2020-04-11 ENCOUNTER — Telehealth: Payer: Self-pay | Admitting: *Deleted

## 2020-04-11 NOTE — Telephone Encounter (Signed)
Patient called asking if she can take Vitamin C, Zinc and Elderberry as she is trying to build up her immunity. Please advise

## 2020-04-11 NOTE — Telephone Encounter (Signed)
That's fine

## 2020-04-11 NOTE — Telephone Encounter (Signed)
Thank you :)

## 2020-04-11 NOTE — Telephone Encounter (Signed)
Call returned to patient and advised of doctor response she then went on to tell me that she also has headache and other symptoms of COVID so I recommended that she call her PCP to arrange appointment for [facility] for tresting and evaluation. She has agreed to this

## 2020-06-29 NOTE — Progress Notes (Signed)
[place]  [phone] [phone]  ID: Michelle Mcmillan OB: [birth year]  MR#: [id number]  RFF#:[id number]  Patient Care Team: Donnie Coffin, MD as PCP - General (Family Medicine) Michelle Junker, RN as Registered Nurse Michelle Demark, RN as Registered Nurse Michelle Huger, MD as Consulting Physician (Oncology) Dillingham, Loel Lofty, DO as Attending Physician (Plastic Surgery)  I connected with Michelle Mcmillan on 06/30/20 at  2:30 PM EDT by video enabled telemedicine visit and verified that I am speaking with the correct person using two identifiers.   I discussed the limitations, risks, security and privacy concerns of performing an evaluation and management service by telemedicine and the availability of in-person appointments. I also discussed with the patient that there may be a patient responsible charge related to this service. The patient expressed understanding and agreed to proceed.   Other persons participating in the visit and their role in the encounter: Patient, MD.  Patient's location: Home. Provider's location: Clinic.  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   INTERVAL HISTORY:  [name] presents today for routine 69-monthfollow-up.  She was last seen in clinic on 12/30/2019.  In the interim, she was evaluated for a tongue lesion who was subsequently seen by ENT and lesion was removed.  [facility].  Had an x-ray which was unremarkable prompting an MRI which did not reveal any explanation for her pain.  Today, patient states she is doing fair.  She continues to have hot flashes secondary to letrozole.  They have improved quite a bit and she is having them daily  but much less.  Endorses worsening hair loss that she noticed over the past couple months.  She recently purchased a product to help  stimulate hair growth which she thinks is helping some.  Reports migraine headaches for which she has been seen by her PCP but appear to be worsening in intensity and frequency.  Has significant light sensitivity, nausea and occasional vomiting.  She has been taking Fioricet without any relief.  Endorses anywhere between 2 and 3 migraines weekly.  Endorses swelling in both arms and occasional twinges of pain.  Chest wall pain appears to be improving.  She is taking gabapentin twice daily.  REVIEW OF SYSTEMS:   Review of Systems  Constitutional: Positive for malaise/fatigue. Negative for chills, fever and weight loss.  HENT: Negative for congestion, ear pain and tinnitus.   Eyes: Negative.  Negative for blurred vision and double vision.  Respiratory: Negative.  Negative for cough, sputum production and shortness of breath.   Cardiovascular: Negative.  Negative for chest pain, palpitations and leg swelling.  Gastrointestinal: Positive for nausea and vomiting. Negative for abdominal pain, constipation and diarrhea.  Genitourinary: Negative for dysuria, frequency and urgency.  Musculoskeletal: Positive for myalgias. Negative for back pain and falls.  Skin: Negative.  Negative for rash.  Neurological: Positive for sensory change and headaches. Negative for weakness.  Endo/Heme/Allergies: Negative.  Does not bruise/bleed easily.  Psychiatric/Behavioral: Negative for depression. The patient has insomnia. The patient is not nervous/anxious.      As per HPI. Otherwise, a complete review of systems is negative.  PAST MEDICAL HISTORY: Past Medical History:  Diagnosis Date  . Anemia    H/O  . Anemia   . Arthritis    RIGHT KNEE  . [place]  [place] breast  . Cancer ([place])    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)  . Dyspnea   . GERD (gastroesophageal reflux disease)    NO MEDS  . Headache    migraines  . Hypertension   . Last menstrual period (LMP) > 10 days ago 08/2015  . Lower extremity  edema   . Neuropathy   . Personal history of radiation therapy     PAST SURGICAL HISTORY: Past Surgical History:  Procedure Laterality Date  . BREAST BIOPSY Right 05/08/2017   invasive mamm. ca  . BREAST EXCISIONAL BIOPSY Right 06/15/15   breast cancer  . BREAST LUMPECTOMY WITH NEEDLE LOCALIZATION Right 06/15/2015   Procedure: BREAST LUMPECTOMY WITH NEEDLE LOCALIZATION;  Surgeon: Hubbard Robinson, MD;  Location: [facility];  Service: General;  Laterality: Right;  . BREAST RECONSTRUCTION WITH PLACEMENT OF TISSUE EXPANDER AND FLEX HD (ACELLULAR HYDRATED DERMIS) Left 02/09/2018   Procedure: BREAST RECONSTRUCTION WITH PLACEMENT OF TISSUE EXPANDER AND FLEX HD (ACELLULAR HYDRATED DERMIS);  Surgeon: Wallace Going, DO;  Location: [facility];  Service: Plastics;  Laterality: Left;  . BREAST SURGERY    . CESAREAN SECTION  1988   x1  . COLONOSCOPY WITH PROPOFOL N/A 01/23/2017   Procedure: COLONOSCOPY WITH PROPOFOL;  Surgeon: Lin Landsman, MD;  Location: [facility] ENDOSCOPY;  Service: Gastroenterology;  Laterality: N/A;  . ESOPHAGOGASTRODUODENOSCOPY (EGD) WITH PROPOFOL N/A 01/23/2017   Procedure: ESOPHAGOGASTRODUODENOSCOPY (EGD) WITH PROPOFOL;  Surgeon: Lin Landsman, MD;  Location: [facility] ENDOSCOPY;  Service: Gastroenterology;  Laterality: N/A;  . LATISSIMUS FLAP TO BREAST Right 06/01/2018   Procedure: RIGHT BREAST RECONSTRUCTION WITH LATISSIMUS MYOCUTANEOUS FLAP;  Surgeon: Wallace Going, DO;  Location: [place];  Service: Plastics;  Laterality: Right;  . LIPOSUCTION WITH LIPOFILLING Bilateral 10/15/2018   Procedure: LIPOSUCTION TO BILATERAL BREASTS;  Surgeon: Wallace Going, DO;  Location: [place];  Service: Plastics;  Laterality: Bilateral;  . LIPOSUCTION WITH LIPOFILLING Bilateral 04/22/2019   Procedure: fat grafting/liposuction and lipofilling bilateral breasts;  Surgeon: Wallace Going, DO;  Location: [place];  Service: Plastics;   Laterality: Bilateral;  . MASS EXCISION N/A 05/20/2016   Procedure: EXCISION CHEST WALL MASS;  Surgeon: Florene Glen, MD;  Location: [facility];  Service: General;  Laterality: N/A;  . RECONSTRUCTION BREAST W/ LATISSIMUS DORSI FLAP Right 06/01/2018  . REMOVAL OF BILATERAL TISSUE EXPANDERS WITH PLACEMENT OF BILATERAL BREAST IMPLANTS Bilateral 10/15/2018   Procedure: REMOVAL OF BILATERAL TISSUE EXPANDERS WITH PLACEMENT OF BILATERAL BREAST IMPLANTS;  Surgeon: Wallace Going, DO;  Location: [place];  Service: Plastics;  Laterality: Bilateral;  . SCAR REVISION Bilateral 04/22/2019   Procedure: Excision of bilateral scar contracture;  Surgeon: Wallace Going, DO;  Location: [place];  Service: Plastics;  Laterality: Bilateral;  total case 90 min  . SENTINEL NODE BIOPSY Right 06/15/2015   Procedure: SENTINEL NODE BIOPSY;  Surgeon: Hubbard Robinson, MD;  Location: [facility];  Service: General;  Laterality: Right;  . TOTAL MASTECTOMY Right 05/22/2017   Procedure: TOTAL MASTECTOMY;  Surgeon: Florene Glen, MD;  Location: [facility];  Service: General;  Laterality: Right;  . TOTAL MASTECTOMY Left 02/09/2018   Procedure: LEFT PROPHYLACTIC  MASTECTOMY;  Surgeon: Jovita Kussmaul, MD;  Location: [facility];  Service: General;  Laterality: Left;  . TUBAL LIGATION  2004  . ULNAR NERVE TRANSPOSITION Left 01/29/2019   Procedure: LEFT ULNAR NERVE DECOMPRESSION/TRANSPOSITION;  Surgeon: Leanora Cover, MD;  Location: [place];  Service: Orthopedics;  Laterality: Left;    FAMILY HISTORY Family History  Problem Relation Age of Onset  . Hypertension Mother   . Hypertension Father   . Hypertension Sister   . Breast cancer Neg Hx        ADVANCED DIRECTIVES:    HEALTH MAINTENANCE: Social History   Tobacco Use  . Smoking status: Never Smoker  . Smokeless tobacco: Never Used  Vaping Use  . Vaping Use: Never used  Substance Use Topics  . Alcohol  use: Not Currently    Comment: WINE ONCE MONTH  . Drug use: No    Allergies  Allergen Reactions  . Nitroglycerin Other (See Comments)    Headache, n/v.    Current Outpatient Medications  Medication Sig Dispense Refill  . BAC 50-325-40 MG tablet Take 1-2 tablets by mouth every 4 (four) hours as needed.    Marland Kitchen buPROPion (WELLBUTRIN XL) 150 MG 24 hr tablet Take 150 mg by mouth daily.    . butalbital-aspirin-caffeine (FIORINAL) 50-325-40 MG capsule Take 1 capsule by mouth as needed for headache.    . calcium-vitamin D (OSCAL WITH D) 500-200 MG-UNIT TABS tablet Take 1 tablet by mouth 2 (two) times daily. With meals    . diclofenac sodium (VOLTAREN) 1 % GEL Apply 4 g topically 4 (four) times daily.    . dorzolamide-timolol (COSOPT) 22.3-6.8 MG/ML ophthalmic solution Place 1 drop into both eyes every morning.    . Ferrous Sulfate (IRON) 325 (65 Fe) MG TABS Take 1 tablet (325 mg total) by mouth 2 (two) times daily. 60 each 2  . gabapentin (NEURONTIN) 300 MG capsule TAKE 1 CAPSULE BY MOUTH 3  TIMES DAILY AND MAY TAKE 2  CAPSULES AT BEDTIME IF MORE BOTHERSOME AT NIGHT 360 capsule 4  . ibuprofen (ADVIL) 800 MG tablet TAKE 1 TABLET BY MOUTH EVERY 8 HOURS WITH FOOD AS NEEDED FOR PAIN    . latanoprost (XALATAN) 0.005 % ophthalmic solution Place 1 drop into both eyes at bedtime.    Marland Kitchen letrozole (FEMARA) 2.5 MG tablet TAKE 1 TABLET BY MOUTH  DAILY 90 tablet 3  . omeprazole (PRILOSEC) 20 MG capsule Take 1 capsule (20 mg total) by mouth daily. 30 capsule 3  . Polyethyl Glycol-Propyl Glycol 0.4-0.3 % SOLN Apply to eye.    . potassium chloride SA (K-DUR,KLOR-CON) 20 MEQ tablet Take 1 tablet (20 mEq total) by mouth 2 (two) times daily. 30 tablet 1  . rizatriptan (MAXALT) 10 MG tablet SMARTSIG:1 Tablet(s) By Mouth 1 to 2 Times Daily    . tetrahydrozoline 0.05 % ophthalmic solution Place 2 drops into both eyes daily.  (Patient not taking: Reported on 03/13/2020)    . traZODone (DESYREL) 50 MG tablet Take 50 mg by  mouth at bedtime.     No current facility-administered medications for this visit.    OBJECTIVE: There were no vitals filed for this visit.   There is no height or weight on file to calculate BMI.    ECOG FS:0 - Asymptomatic  General: Well-developed, well-nourished, no acute distress. HEENT: Normocephalic. Neuro: Alert, answering all questions appropriately. Cranial nerves grossly intact. Psych: Normal affect.  LAB RESULTS:  Lab Results  Component Value Date   NA 141 01/25/2019   K 3.8 01/25/2019   CL 102 01/25/2019   CO2 30 01/25/2019   GLUCOSE 88 01/25/2019   BUN 18 01/25/2019   CREATININE 1.07 (H) 01/25/2019   CALCIUM 9.3 01/25/2019   PROT 6.9 10/21/2017   ALBUMIN 3.4 (L)  10/21/2017   AST 37 10/21/2017   ALT 48 (H) 10/21/2017   ALKPHOS 79 10/21/2017   BILITOT 0.4 10/21/2017   GFRNONAA >60 01/25/2019   GFRAA >60 01/25/2019    Lab Results  Component Value Date   WBC 7.9 06/02/2018   NEUTROABS 3.7 10/21/2017   HGB 9.5 (L) 06/02/2018   HCT 31.6 (L) 06/02/2018   MCV 77.6 (L) 06/02/2018   PLT 216 06/02/2018     STUDIES: No results found.  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 BRCA 1 and 2 negative, Oncotype DX score 17 which is considered low risk.  PLAN:    1. 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  Completed adjuvant XRT on 09/19/2015.  Started on Aromasin in June 2017. -Had mammogram on 05/06/2017 which showed suspicious right breast mass.  -Had prophylactic bilateral mastectomy on 05/22/2017-pathology revealed second primary -Oncotype was not sent on second breast cancer. -CT scan (07/01/17) and bone scan (11/12/17) without evidence of  metastatic disease. -She completed 5 cycles of Taxotere and Cytoxan on 09/16/2017. -She received 1 additional cycle secondary to a reaction she had with cycle 1.  -She completed reconstructive surgery for both breast with [name] on 02/09/18. -Does not require mammograms given bilateral mastectomy. -She is currently on letrozole which she will complete in June 2024. -RTC in 6 months.   2. Genetic testing:  -BRCA1 and BRCA2 negative.  3.  Neuropathy:  -Stable on gabapentin BID.  4.  Chest wall mass/pain: -CT scan from 01/19/2019 with nonspecific 2.5 cm chest wall mass. -PET scan on 02/03/2019 did not reveal any hypermetabolism. -Referal to lymphedema clinic. -Referral to Care program.  5.  Bone health: -Most recent DEXA from 12/01/2019 reported T score of 0.7 which is normal. -Continue calcium and vitamin D supplementation.  -Repeat in September 2023.  6. Hot flashes -Improving. -Daily but are tolerable.  7. 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 -Noticed over the past couple months -She has recently started to treat with OTC products. -She would like to hold off on switching aromatase inhibitors and will trial OTC products to help stimulate hair growth.  8.  Migraine headaches: -She is currently followed by PCP but feels her migraines are worsening. -Reports 2-3 migraines weekly. -Currently on Fioricet which does not appear to be helping. -Also reports intermittent confusion and " foggy " feeling.  -Referral to neuro oncology.   Disposition: -Referral to lymphedema clinic.  -Referral to the care program. -Referral to neuro oncology for migraines, confusion, brain fog -Repeat DEXA in 6 months. -RTC in 6 months with follow-up.  I provided 20 minutes of face-to-face video visit time during this encounter which included chart review, counseling, and coordination of care as documented above.  Patient expressed understanding and was in agreement with this plan. She  also understands that She can call clinic at any time with any questions, concerns, or complaints.    Jacquelin Hawking, NP 06/30/20 3:40 PM

## 2020-06-30 ENCOUNTER — Inpatient Hospital Stay: Payer: Medicare Other | Attending: Oncology | Admitting: Oncology

## 2020-06-30 DIAGNOSIS — G43119 Migraine with aura, intractable, without status migrainosus: Secondary | ICD-10-CM

## 2020-06-30 DIAGNOSIS — Z79899 Other long term (current) drug therapy: Secondary | ICD-10-CM | POA: Insufficient documentation

## 2020-06-30 DIAGNOSIS — Z170 Estrogen receptor positive status [ER+]: Secondary | ICD-10-CM | POA: Insufficient documentation

## 2020-06-30 DIAGNOSIS — C50411 Malignant neoplasm of upper-outer quadrant of right female breast: Secondary | ICD-10-CM | POA: Insufficient documentation

## 2020-06-30 DIAGNOSIS — Z9012 Acquired absence of left breast and nipple: Secondary | ICD-10-CM | POA: Insufficient documentation

## 2020-06-30 DIAGNOSIS — G43819 Other migraine, intractable, without status migrainosus: Secondary | ICD-10-CM | POA: Diagnosis not present

## 2020-06-30 DIAGNOSIS — R0789 Other chest pain: Secondary | ICD-10-CM

## 2020-06-30 DIAGNOSIS — G43009 Migraine without aura, not intractable, without status migrainosus: Secondary | ICD-10-CM | POA: Insufficient documentation

## 2020-07-21 ENCOUNTER — Inpatient Hospital Stay (HOSPITAL_BASED_OUTPATIENT_CLINIC_OR_DEPARTMENT_OTHER): Payer: Medicare Other | Admitting: Internal Medicine

## 2020-07-21 ENCOUNTER — Other Ambulatory Visit: Payer: Self-pay

## 2020-07-21 ENCOUNTER — Encounter: Payer: Self-pay | Admitting: Internal Medicine

## 2020-07-21 VITALS — BP 136/102 | HR 79 | Temp 97.8°F | Resp 20 | Wt 335.3 lb

## 2020-07-21 DIAGNOSIS — G43009 Migraine without aura, not intractable, without status migrainosus: Secondary | ICD-10-CM | POA: Insufficient documentation

## 2020-07-21 DIAGNOSIS — G4730 Sleep apnea, unspecified: Secondary | ICD-10-CM | POA: Diagnosis not present

## 2020-07-21 DIAGNOSIS — C50411 Malignant neoplasm of upper-outer quadrant of right female breast: Secondary | ICD-10-CM | POA: Diagnosis not present

## 2020-07-21 DIAGNOSIS — Z79899 Other long term (current) drug therapy: Secondary | ICD-10-CM | POA: Diagnosis not present

## 2020-07-21 DIAGNOSIS — Z170 Estrogen receptor positive status [ER+]: Secondary | ICD-10-CM | POA: Diagnosis not present

## 2020-07-21 DIAGNOSIS — Z9012 Acquired absence of left breast and nipple: Secondary | ICD-10-CM | POA: Diagnosis not present

## 2020-07-21 NOTE — Progress Notes (Signed)
Patient states she is having pain in her bones and at least one day out the week she is having very bad pain in her bones. Patient states no new concerns at the moment.

## 2020-07-21 NOTE — Progress Notes (Signed)
[facility] [place], [place] [address] [phone]   New Patient Evaluation  Date of Service: 07/21/20 Patient Name: Michelle Mcmillan Patient MRN: [id number] Patient DOB: 1971/01/10 Provider: Ventura Sellers, MD  Identifying Statement:  Michelle Mcmillan is a 50 y.o. female with migrain headaches who presents for initial consultation and evaluation regarding cancer associated neurologic deficits.    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   History of Present Illness: The patient's records from the referring physician were obtained and reviewed and the patient interviewed to confirm this HPI.  Michelle Mcmillan presents today to discuss headache syndrome.  She describes 1 year history of lateralized pressure/pounding type pain, lasting for hours, associated with photophobia, phonophobia, nausea and vomiting.  Frequency is 2-4x per week, often in the morning, but not always.  She typically doses fioricet, maxalt, or ibuprofen for breakthrough pain, with mixed success.  She does describe history of migraines in her 20's and 30's, but less severe and less frequent.  Does complain of poor sleep, frequent awakenings, loud snoring, and excessive daytime sleepiness.  She was titrated for CPAP back in 2016 after a sleep study but never initiated therapy.   Medications: Current Outpatient Medications on File Prior to Visit  Medication Sig Dispense Refill  . BAC 50-325-40 MG tablet Take 1-2 tablets by mouth every 4 (four) hours as needed.    Marland Kitchen buPROPion (WELLBUTRIN XL) 150 MG 24 hr tablet Take 150 mg by mouth daily.    . butalbital-aspirin-caffeine (FIORINAL) 50-325-40 MG capsule Take 1 capsule by mouth as needed for headache.    . calcium-vitamin D (OSCAL WITH D) 500-200  MG-UNIT TABS tablet Take 1 tablet by mouth 2 (two) times daily. With meals    . diclofenac sodium (VOLTAREN) 1 % GEL Apply 4 g topically 4 (four) times daily.    . dorzolamide-timolol (COSOPT) 22.3-6.8 MG/ML ophthalmic solution Place 1 drop into both eyes every morning.    . Ferrous Sulfate (IRON) 325 (65 Fe) MG TABS Take 1 tablet (325 mg total) by mouth 2 (two) times daily. 60 each 2  . gabapentin (NEURONTIN) 300 MG capsule TAKE 1 CAPSULE BY MOUTH 3  TIMES DAILY AND MAY TAKE 2  CAPSULES AT BEDTIME IF MORE BOTHERSOME AT NIGHT 360 capsule 4  . ibuprofen (ADVIL) 800 MG tablet TAKE 1 TABLET BY MOUTH EVERY 8 HOURS WITH FOOD AS NEEDED FOR PAIN    . latanoprost (XALATAN) 0.005 % ophthalmic solution Place 1 drop into both eyes at bedtime.    Marland Kitchen letrozole (FEMARA) 2.5 MG tablet TAKE 1 TABLET BY MOUTH  DAILY 90 tablet 3  . omeprazole (PRILOSEC) 20 MG capsule Take 1 capsule (20 mg total) by mouth daily. 30 capsule 3  . Polyethyl Glycol-Propyl Glycol 0.4-0.3 % SOLN Apply to eye.    . potassium chloride SA (K-DUR,KLOR-CON) 20 MEQ tablet Take 1 tablet (20 mEq total) by mouth 2 (two) times daily. 30 tablet 1  . rizatriptan (MAXALT) 10 MG tablet SMARTSIG:1 Tablet(s) By Mouth 1 to 2 Times Daily    . traZODone (DESYREL) 50 MG tablet Take 50 mg by mouth at bedtime.    Marland Kitchen zinc gluconate 50 MG tablet Take 50 mg by mouth daily.    Marland Kitchen tetrahydrozoline 0.05 % ophthalmic solution Place 2 drops into  both eyes daily.  (Patient not taking: No sig reported)     No current facility-administered medications on file prior to visit.    Allergies:  Allergies  Allergen Reactions  . Nitroglycerin Other (See Comments)    Headache, n/v.   Past Medical History:  Past Medical History:  Diagnosis Date  . Anemia    H/O  . Anemia   . Arthritis    RIGHT KNEE  . Breast cancer ([facility]) 2017   right breast  . Cancer ([place])    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)  . Dyspnea   . GERD (gastroesophageal reflux disease)    NO MEDS  . Headache     migraines  . Hypertension   . Last menstrual period (LMP) > 10 days ago 08/2015  . Lower extremity edema   . Neuropathy   . Personal history of radiation therapy    Past Surgical History:  Past Surgical History:  Procedure Laterality Date  . BREAST BIOPSY Right 05/08/2017   invasive mamm. ca  . BREAST EXCISIONAL BIOPSY Right 06/15/15   breast cancer  . BREAST LUMPECTOMY WITH NEEDLE LOCALIZATION Right 06/15/2015   Procedure: BREAST LUMPECTOMY WITH NEEDLE LOCALIZATION;  Surgeon: Hubbard Robinson, MD;  Location: [facility];  Service: General;  Laterality: Right;  . BREAST RECONSTRUCTION WITH PLACEMENT OF TISSUE EXPANDER AND FLEX HD (ACELLULAR HYDRATED DERMIS) Left 02/09/2018   Procedure: BREAST RECONSTRUCTION WITH PLACEMENT OF TISSUE EXPANDER AND FLEX HD (ACELLULAR HYDRATED DERMIS);  Surgeon: Wallace Going, DO;  Location: [facility];  Service: Plastics;  Laterality: Left;  . BREAST SURGERY    . CESAREAN SECTION  1988   x1  . COLONOSCOPY WITH PROPOFOL N/A 01/23/2017   Procedure: COLONOSCOPY WITH PROPOFOL;  Surgeon: Lin Landsman, MD;  [facility];  Service: Gastroenterology;  Laterality: N/A;  . ESOPHAGOGASTRODUODENOSCOPY (EGD) WITH PROPOFOL N/A 01/23/2017   Procedure: ESOPHAGOGASTRODUODENOSCOPY (EGD) WITH PROPOFOL;  Surgeon: Lin Landsman, MD;  Location: [facility] ENDOSCOPY;  Service: Gastroenterology;  Laterality: N/A;  . LATISSIMUS FLAP TO BREAST Right 06/01/2018   Procedure: RIGHT BREAST RECONSTRUCTION WITH LATISSIMUS MYOCUTANEOUS FLAP;  Surgeon: Wallace Going, DO;  Location: [facility];  Service: Plastics;  Laterality: Right;  . LIPOSUCTION WITH LIPOFILLING Bilateral 10/15/2018   Procedure: LIPOSUCTION TO BILATERAL BREASTS;  Surgeon: Wallace Going, DO;  Location: [facility];  Service: Plastics;  Laterality: Bilateral;  . LIPOSUCTION WITH LIPOFILLING Bilateral 04/22/2019   Procedure: fat grafting/liposuction and lipofilling bilateral  breasts;  Surgeon: Wallace Going, DO;  Location: [facility];  Service: Plastics;  Laterality: Bilateral;  . MASS EXCISION N/A 05/20/2016   Procedure: EXCISION CHEST WALL MASS;  Surgeon: Florene Glen, MD;  Location: [facility];  Service: General;  Laterality: N/A;  . RECONSTRUCTION BREAST W/ LATISSIMUS DORSI FLAP Right 06/01/2018  . REMOVAL OF BILATERAL TISSUE EXPANDERS WITH PLACEMENT OF BILATERAL BREAST IMPLANTS Bilateral 10/15/2018   Procedure: REMOVAL OF BILATERAL TISSUE EXPANDERS WITH PLACEMENT OF BILATERAL BREAST IMPLANTS;  Surgeon: Wallace Going, DO;  Location: [facility] [facility];  Service: Plastics;  Laterality: Bilateral;  . SCAR REVISION Bilateral 04/22/2019   Procedure: Excision of bilateral scar contracture;  Surgeon: Wallace Going, DO;  Location: [facility];  Service: Plastics;  Laterality: Bilateral;  total case 90 min  . SENTINEL NODE BIOPSY Right 06/15/2015   Procedure: SENTINEL NODE BIOPSY;  Surgeon: Hubbard Robinson, MD;  Location: [facility];  Service: General;  Laterality: Right;  . TOTAL MASTECTOMY Right  05/22/2017   Procedure: TOTAL MASTECTOMY;  Surgeon: Florene Glen, MD;  Location: [facility];  Service: General;  Laterality: Right;  . TOTAL MASTECTOMY Left 02/09/2018   Procedure: LEFT PROPHYLACTIC  MASTECTOMY;  Surgeon: Jovita Kussmaul, MD;  Location: [facility];  Service: General;  Laterality: Left;  . TUBAL LIGATION  2004  . ULNAR NERVE TRANSPOSITION Left 01/29/2019   Procedure: LEFT ULNAR NERVE DECOMPRESSION/TRANSPOSITION;  Surgeon: Leanora Cover, MD;  Location: [facility];  Service: Orthopedics;  Laterality: Left;   Social History:  Social History   Socioeconomic History  . Marital status: Married    Spouse name: Not on file  . Number of children: Not on file  . Years of education: Not on file  . Highest education level: Not on file  Occupational History  . Not on file  Tobacco Use  . Smoking  status: Never Smoker  . Smokeless tobacco: Never Used  Vaping Use  . Vaping Use: Never used  Substance and Sexual Activity  . Alcohol use: Not Currently    Comment: WINE ONCE MONTH  . Drug use: No  . Sexual activity: Not on file  Other Topics Concern  . Not on file  Social History Narrative  . Not on file   Social Determinants of Health   Financial Resource Strain: Not on file  Food Insecurity: Not on file  Transportation Needs: Not on file  Physical Activity: Not on file  Stress: Not on file  Social Connections: Not on file  Intimate Partner Violence: Not on file   Family History:  Family History  Problem Relation Age of Onset  . Hypertension Mother   . Hypertension Father   . Hypertension Sister   . Breast cancer Neg Hx     Review of Systems: Constitutional: Doesn't report fevers, chills or abnormal weight loss Eyes: Doesn't report blurriness of vision Ears, nose, mouth, throat, and face: Doesn't report sore throat Respiratory: Doesn't report cough, dyspnea or wheezes Cardiovascular: Doesn't report palpitation, chest discomfort  Gastrointestinal:  Doesn't report nausea, constipation, diarrhea GU: Doesn't report incontinence Skin: Doesn't report skin rashes Neurological: Per HPI Musculoskeletal: Doesn't report joint pain Behavioral/Psych: Doesn't report anxiety  Physical Exam: Vitals:   07/21/20 1009  BP: (!) 136/102  Pulse: 79  Resp: 20  Temp: 97.8 F (36.6 C)  SpO2: 100%   KPS: 90. General: Alert, cooperative, pleasant, in no acute distress Head: Normal EENT: No conjunctival injection or scleral icterus.  Lungs: Resp effort normal Cardiac: Regular rate Abdomen: Non-distended abdomen Skin: No rashes cyanosis or petechiae. Extremities: No clubbing or edema  Neurologic Exam: Mental Status: Awake, alert, attentive to examiner. Oriented to self and environment. Language is fluent with intact comprehension.  Cranial Nerves: Visual acuity is grossly  normal. Visual fields are full. Extra-ocular movements intact. No ptosis. Face is symmetric Motor: Tone and bulk are normal. Power is full in both arms and legs. Reflexes are symmetric, no pathologic reflexes present.  Sensory: Intact to light touch Gait: Normal.   Labs: I have reviewed the data as listed    Component Value Date/Time   NA 141 01/25/2019 1100   K 3.8 01/25/2019 1100   CL 102 01/25/2019 1100   CO2 30 01/25/2019 1100   GLUCOSE 88 01/25/2019 1100   BUN 18 01/25/2019 1100   CREATININE 1.07 (H) 01/25/2019 1100   CALCIUM 9.3 01/25/2019 1100   PROT 6.9 10/21/2017 0944   ALBUMIN 3.4 (L) 10/21/2017 0944   AST 37 10/21/2017 0944  ALT 48 (H) 10/21/2017 0944   ALKPHOS 79 10/21/2017 0944   BILITOT 0.4 10/21/2017 0944   GFRNONAA >60 01/25/2019 1100   GFRAA >60 01/25/2019 1100   Lab Results  Component Value Date   WBC 7.9 06/02/2018   NEUTROABS 3.7 10/21/2017   HGB 9.5 (L) 06/02/2018   HCT 31.6 (L) 06/02/2018   MCV 77.6 (L) 06/02/2018   PLT 216 06/02/2018    Assessment/Plan Sleep-disordered breathing - Plan: Polysomnography 4 or more parameters  Migraine without aura and without status migrainosus, not intractable  [name] presents with clinical syndrome consistent with migraine without aura.  We have strong suspicion that underlying etiology is sleep-disordered breathing, based on clinical and objective history.    Patient is agreeable to repeating sleep study (prior was 5-6 years ago, before cancer treatment), and understands that we will not recommend more advanced pharmacologic intervention until this information is obtained.    In the meantime, she can continue to treat breakthrough headaches with maxalt or ibuprofen, preferred over Fioricet due to rebound effect with caffeine. She should treat headaches earlier in the pain episode as well.  We also counseled her on lifestyle interventions; diet/food, aerobic exercise, mindfulness.   We spent twenty  additional minutes teaching regarding the natural history, biology, and historical experience in the treatment of neurologic complications of cancer.   We appreciate the opportunity to participate in the care of Ayiana Winslett.  She should follow up with [place] after sleep study and CPAP titration/intervention if she continues to have migraines.   All questions were answered. The patient knows to call the clinic with any problems, questions or concerns. No barriers to learning were detected.  The total time spent in the encounter was 40 minutes and more than 50% was on counseling and review of test results   Ventura Sellers, MD Medical Director of Neuro-Oncology [facility] [place] 07/21/20 11:39 AM

## 2020-09-28 ENCOUNTER — Ambulatory Visit (INDEPENDENT_AMBULATORY_CARE_PROVIDER_SITE_OTHER): Payer: Medicare Other | Admitting: Pulmonary Disease

## 2020-09-28 ENCOUNTER — Encounter: Payer: Self-pay | Admitting: Pulmonary Disease

## 2020-09-28 ENCOUNTER — Other Ambulatory Visit: Payer: Self-pay

## 2020-09-28 VITALS — BP 130/66 | HR 67 | Temp 98.4°F | Ht 69.0 in | Wt 322.6 lb

## 2020-09-28 DIAGNOSIS — R0683 Snoring: Secondary | ICD-10-CM | POA: Diagnosis not present

## 2020-09-28 DIAGNOSIS — G4721 Circadian rhythm sleep disorder, delayed sleep phase type: Secondary | ICD-10-CM

## 2020-09-28 NOTE — Progress Notes (Signed)
[facility]  Chief Complaint  Patient presents with   Follow-up    Sleep study-    Constitutional:  BP 130/66 (BP Location: Left Arm, Patient Position: Sitting, Cuff Size: Normal)   Pulse 67   Temp 98.4 F (36.9 C) (Oral)   Ht 5\' 9"  (1.753 m)   Wt (!) 322 lb 9.6 oz (146.3 kg)   SpO2 98%   BMI 47.64 kg/m   Past Medical History:  Anemia, OA, Rt breast cancer 2017, GERD, Migraine headache, HTN, Neuropathy  Past Surgical History:  She  has a past surgical history that includes Cesarean section (1988); Tubal ligation (2004); Mass excision (N/A, 05/20/2016); Breast lumpectomy with needle localization (Right, 06/15/2015); Sentinel node biopsy (Right, 06/15/2015); Breast surgery; Colonoscopy with propofol (N/A, 01/23/2017); Esophagogastroduodenoscopy (egd) with propofol (N/A, 01/23/2017); Breast excisional biopsy (Right, 06/15/15); Breast biopsy (Right, 05/08/2017); Total mastectomy (Right, 05/22/2017); Total mastectomy (Left, 02/09/2018); Breast reconstruction with placement of tissue expander and flex hd (acellular hydrated dermis) (Left, 02/09/2018); Reconstruction breast w/ latissimus dorsi flap (Right, 06/01/2018); Latissimus flap to breast (Right, 06/01/2018); Removal of bilateral tissue expanders with placement of bilateral breast implants (Bilateral, 10/15/2018); Liposuction with lipofilling (Bilateral, 10/15/2018); Ulnar nerve transposition (Left, 01/29/2019); Scar revision (Bilateral, 04/22/2019); and Liposuction with lipofilling (Bilateral, 04/22/2019).  Brief Summary:  Michelle Mcmillan is a 50 y.o. female with snoring.      Subjective:   She had sleep study several years ago and was told she had positional apnea.  She has been noticing more trouble with her sleep and getting more frequent headaches.  She snores and wakes up feeling choked.  She gets frequent leg cramps at night.  She is tired during the day and can fall asleep when sitting  quiet.  She takes trazodone at 930 pm.  She won't feel sleepy until 230 am.  She wants to get up at 8 am, but doesn't feel like she is fully awake until 10 am.  She uses the bathroom several times at night.  She drinks coffee to help stay awake.  She denies sleep walking, sleep talking, bruxism, or nightmares.  There is no history of restless legs.  She denies sleep hallucinations, sleep paralysis, or cataplexy.  The Epworth score is 10 out of 24.   Physical Exam:   Appearance - well kempt   ENMT - no sinus tenderness, no oral exudate, no LAN, Mallampati 3 airway, no stridor  Respiratory - equal breath sounds bilaterally, no wheezing or rales  CV - s1s2 regular rate and rhythm, no murmurs  Ext - no clubbing, no edema  Skin - no rashes  Psych - normal mood and affect   Sleep Tests:    Social History:  She  reports that she has never smoked. She has never used smokeless tobacco. She reports previous alcohol use. She reports that she does not use drugs.  Family History:  Her family history includes Hypertension in her father, mother, and sister.    Discussion:  She has snoring, sleep disruption, apnea, and daytime sleepiness.  Her BMI is > 35.  She has history of hypertension.  I am concerned she could have obstructive sleep apnea.  She likely also has delayed sleep phase contributing symptoms of insomnia.  Assessment/Plan:   Snoring with excessive daytime sleepiness. - will need to arrange for a home sleep study  Delayed sleep phase. - advised her to maintain a regular sleep/wake schedule (1 am to 8 am) - avoid bright light  at night and get bright light in the morning - can try melatonin 3 mg 30 minutes before bedtime - okay to continue trazodone for now  Obesity. - discussed how weight can impact sleep and risk for sleep disordered breathing - discussed options to assist with weight loss: combination of diet modification, cardiovascular and strength training  exercises  Cardiovascular risk. - had an extensive discussion regarding the adverse health consequences related to untreated sleep disordered breathing - specifically discussed the risks for hypertension, coronary artery disease, cardiac dysrhythmias, cerebrovascular disease, and diabetes - lifestyle modification discussed  Safe driving practices. - discussed how sleep disruption can increase risk of accidents, particularly when driving - safe driving practices were discussed  Therapies for obstructive sleep apnea. - if the sleep study shows significant sleep apnea, then various therapies for treatment were reviewed: CPAP, oral appliance, and surgical interventions   Time Spent Involved in Patient Care on Day of Examination:  32 minutes  Follow up:   Patient Instructions  Avoid bright light sources close to bed time. Maintain a regular time of when you go to bed and when you wake up. Can use melatonin 3 mg about 30 minutes before your desired bedtime. Get natural light source early in the morning after you wake up. Avoid napping during the day as able. Will arrange for home sleep study. Will call to arrange for follow up after sleep study reviewed.  Medication List:   Allergies as of 09/28/2020       Reactions   Nitroglycerin Other (See Comments)   Headache, n/v.        Medication List        Accurate as of September 28, 2020 11:32 AM. If you have any questions, ask your nurse or doctor.          Bac 50-325-40 MG tablet Generic drug: butalbital-acetaminophen-caffeine Take 1-2 tablets by mouth every 4 (four) hours as needed.   buPROPion 150 MG 24 hr tablet Commonly known as: WELLBUTRIN XL Take 150 mg by mouth daily.   butalbital-aspirin-caffeine 50-325-40 MG capsule Commonly known as: FIORINAL Take 1 capsule by mouth as needed for headache.   calcium-vitamin D 500-200 MG-UNIT Tabs tablet Commonly known as: OSCAL WITH D Take 1 tablet by mouth 2 (two) times  daily. With meals   diclofenac sodium 1 % Gel Commonly known as: VOLTAREN Apply 4 g topically 4 (four) times daily.   dorzolamide-timolol 22.3-6.8 MG/ML ophthalmic solution Commonly known as: COSOPT Place 1 drop into both eyes every morning.   gabapentin 300 MG capsule Commonly known as: NEURONTIN TAKE 1 CAPSULE BY MOUTH 3  TIMES DAILY AND MAY TAKE 2  CAPSULES AT BEDTIME IF MORE BOTHERSOME AT NIGHT   ibuprofen 800 MG tablet Commonly known as: ADVIL TAKE 1 TABLET BY MOUTH EVERY 8 HOURS WITH FOOD AS NEEDED FOR PAIN   Iron 325 (65 Fe) MG Tabs Take 1 tablet (325 mg total) by mouth 2 (two) times daily.   latanoprost 0.005 % ophthalmic solution Commonly known as: XALATAN Place 1 drop into both eyes at bedtime.   letrozole 2.5 MG tablet Commonly known as: FEMARA TAKE 1 TABLET BY MOUTH  DAILY   omeprazole 20 MG capsule Commonly known as: PRILOSEC Take 1 capsule (20 mg total) by mouth daily.   Polyethyl Glycol-Propyl Glycol 0.4-0.3 % Soln Apply to eye.   potassium chloride SA 20 MEQ tablet Commonly known as: KLOR-CON Take 1 tablet (20 mEq total) by mouth 2 (two) times daily.  rizatriptan 10 MG tablet Commonly known as: MAXALT SMARTSIG:1 Tablet(s) By Mouth 1 to 2 Times Daily   tetrahydrozoline 0.05 % ophthalmic solution Place 2 drops into both eyes daily.   traZODone 50 MG tablet Commonly known as: DESYREL Take 50 mg by mouth at bedtime.   zinc gluconate 50 MG tablet Take 50 mg by mouth daily.        Signature:  Chesley Mires, MD [place] Pager - [phone] 09/28/2020, 11:32 AM

## 2020-09-28 NOTE — Patient Instructions (Signed)
Avoid bright light sources close to bed time. Maintain a regular time of when you go to bed and when you wake up. Can use melatonin 3 mg about 30 minutes before your desired bedtime. Get natural light source early in the morning after you wake up. Avoid napping during the day as able. Will arrange for home sleep study. Will call to arrange for follow up after sleep study reviewed.

## 2020-10-16 ENCOUNTER — Other Ambulatory Visit: Payer: Self-pay | Admitting: Oncology

## 2020-11-24 ENCOUNTER — Ambulatory Visit: Payer: Medicare Other

## 2020-11-24 ENCOUNTER — Other Ambulatory Visit: Payer: Self-pay

## 2020-11-24 DIAGNOSIS — R0683 Snoring: Secondary | ICD-10-CM

## 2020-11-24 DIAGNOSIS — G4733 Obstructive sleep apnea (adult) (pediatric): Secondary | ICD-10-CM | POA: Diagnosis not present

## 2020-11-29 ENCOUNTER — Telehealth: Payer: Self-pay | Admitting: Pulmonary Disease

## 2020-11-29 DIAGNOSIS — G4733 Obstructive sleep apnea (adult) (pediatric): Secondary | ICD-10-CM | POA: Diagnosis not present

## 2020-11-29 NOTE — Telephone Encounter (Signed)
HST 11/26/20 >> AHI 28.1, SpO2 low 72%   Please inform her that her sleep study shows moderate obstructive sleep apnea.  Please arrange for ROV with me or NP to discuss treatment options.

## 2020-12-01 NOTE — Telephone Encounter (Signed)
ATC patient, LMTCB 

## 2020-12-06 NOTE — Telephone Encounter (Signed)
Called and spoke with patient to let her know about HST results from [facility] and get her scheduled for follow up. Patient expressed understanding and is now scheduled for follow up at [facility] office with Dr. Halford Chessman. Nothing further needed at this time.  Next Appt With Pulmonology [facility], MD)01/11/2021 at  9:30 AM

## 2021-01-06 ENCOUNTER — Other Ambulatory Visit: Payer: Self-pay | Admitting: Oncology

## 2021-01-09 ENCOUNTER — Telehealth: Payer: Self-pay | Admitting: *Deleted

## 2021-01-09 NOTE — Telephone Encounter (Signed)
Received on (12/29/2020) via of fax DME Standard Written Order.  Requesting signature and return.  Given to provider to sign.    Order signed and faxed back to [facility].  Confirmation received and copy scanned into the chart.//AB/CMA

## 2021-01-11 ENCOUNTER — Other Ambulatory Visit: Payer: Self-pay

## 2021-01-11 ENCOUNTER — Ambulatory Visit: Payer: Medicare Other | Admitting: Pulmonary Disease

## 2021-01-11 ENCOUNTER — Ambulatory Visit (INDEPENDENT_AMBULATORY_CARE_PROVIDER_SITE_OTHER): Payer: Medicare Other | Admitting: Pulmonary Disease

## 2021-01-11 ENCOUNTER — Encounter: Payer: Self-pay | Admitting: Pulmonary Disease

## 2021-01-11 VITALS — BP 130/78 | HR 78 | Temp 97.5°F | Ht 69.0 in | Wt 327.6 lb

## 2021-01-11 DIAGNOSIS — G4733 Obstructive sleep apnea (adult) (pediatric): Secondary | ICD-10-CM

## 2021-01-11 DIAGNOSIS — G4721 Circadian rhythm sleep disorder, delayed sleep phase type: Secondary | ICD-10-CM | POA: Diagnosis not present

## 2021-01-11 DIAGNOSIS — G4730 Sleep apnea, unspecified: Secondary | ICD-10-CM

## 2021-01-11 DIAGNOSIS — Z7189 Other specified counseling: Secondary | ICD-10-CM | POA: Diagnosis not present

## 2021-01-11 DIAGNOSIS — E669 Obesity, unspecified: Secondary | ICD-10-CM

## 2021-01-11 NOTE — Progress Notes (Signed)
[facility]  Chief Complaint  Patient presents with   Follow-up    F/u after home sleep study-    Past Surgical History:  She  has a past surgical history that includes Cesarean section (1988); Tubal ligation (2004); Mass excision (N/A, 05/20/2016); Breast lumpectomy with needle localization (Right, 06/15/2015); Sentinel node biopsy (Right, 06/15/2015); Breast surgery; Colonoscopy with propofol (N/A, 01/23/2017); Esophagogastroduodenoscopy (egd) with propofol (N/A, 01/23/2017); Breast excisional biopsy (Right, 06/15/15); Breast biopsy (Right, 05/08/2017); Total mastectomy (Right, 05/22/2017); Total mastectomy (Left, 02/09/2018); Breast reconstruction with placement of tissue expander and flex hd (acellular hydrated dermis) (Left, 02/09/2018); Reconstruction breast w/ latissimus dorsi flap (Right, 06/01/2018); Latissimus flap to breast (Right, 06/01/2018); Removal of bilateral tissue expanders with placement of bilateral breast implants (Bilateral, 10/15/2018); Liposuction with lipofilling (Bilateral, 10/15/2018); Ulnar nerve transposition (Left, 01/29/2019); Scar revision (Bilateral, 04/22/2019); and Liposuction with lipofilling (Bilateral, 04/22/2019).  Past Medical History:  Anemia, OA, Rt breast cancer 2017, GERD, Migraine headache, HTN, Neuropathy  Constitutional:  BP 130/78 (BP Location: Left Arm, Patient Position: Sitting, Cuff Size: Normal)   Pulse 78   Temp (!) 97.5 F (36.4 C) (Temporal)   Ht 5\' 9"  (1.753 m)   Wt (!) 327 lb 9.6 oz (148.6 kg)   SpO2 98%   BMI 48.38 kg/m   Brief Summary:  Michelle Mcmillan is a 50 y.o. female with obstructive sleep apnea and delayed sleep phase.      Subjective:   She had home sleep study in August.  Showed moderate sleep apnea.  She has appointment with her dentist to get a partial implant.  She tried keto diet before and lost 60 lbs.  She was sleeping much better then.  She had different things come up, and  wasn't able to keep up with her diet.  As a result she regained the weight.   Physical Exam:   Appearance - well kempt   ENMT - no sinus tenderness, no oral exudate, no LAN, Mallampati 3 airway, no stridor, missing some teeth  Respiratory - equal breath sounds bilaterally, no wheezing or rales  CV - s1s2 regular rate and rhythm, no murmurs  Ext - no clubbing, no edema  Skin - no rashes  Psych - normal mood and affect    Sleep Tests:  HST 11/26/20 >> AHI 28.1, SpO2 low 72%  Social History:  She  reports that she has never smoked. She has never used smokeless tobacco. She reports that she does not currently use alcohol. She reports that she does not use drugs.  Family History:  Her family history includes Hypertension in her father, mother, and sister.     Assessment/Plan:   Obstructive sleep apnea. - reviewed her sleep study results - discussed treatment options - reviewed how sleep apnea can impact her health - will arrange for auto CPAP set up - she will discuss with her dentist about whether she is a candidate for an oral appliance  Delayed sleep phase. - advised her to maintain a regular sleep/wake schedule (1 am to 8 am) - avoid bright light at night and get bright light in the morning - can try melatonin 3 mg 30 minutes before bedtime - okay to continue trazodone for now - will reassess after she is established on therapy for sleep apnea  Obesity. - provided her with contact information for [place] weight loss program - she might try keto diet again   Time Spent Involved in Patient Care on Day of  Examination:  25 minutes  Follow up:   Patient Instructions  Ask your dentist about whether using an oral appliance is an option for treating your sleep apnea  Will provide you with contact information for [place] weight loss program  Will arrange for auto CPAP set up  Follow up in 4 to 5 months  Medication List:   Allergies as of 01/11/2021        Reactions   Nitroglycerin Other (See Comments)   Headache, n/v.        Medication List        Accurate as of January 11, 2021 12:28 PM. If you have any questions, ask your nurse or doctor.          STOP taking these medications    tetrahydrozoline 0.05 % ophthalmic solution Stopped by: Chesley Mires, MD       TAKE these medications    Bac 50-325-40 MG tablet Generic drug: butalbital-acetaminophen-caffeine Take 1-2 tablets by mouth every 4 (four) hours as needed.   buPROPion 150 MG 24 hr tablet Commonly known as: WELLBUTRIN XL Take 150 mg by mouth daily.   butalbital-aspirin-caffeine 50-325-40 MG capsule Commonly known as: FIORINAL Take 1 capsule by mouth as needed for headache.   calcium-vitamin D 500-200 MG-UNIT Tabs tablet Commonly known as: OSCAL WITH D Take 1 tablet by mouth 2 (two) times daily. With meals   diclofenac sodium 1 % Gel Commonly known as: VOLTAREN Apply 4 g topically 4 (four) times daily.   dorzolamide-timolol 22.3-6.8 MG/ML ophthalmic solution Commonly known as: COSOPT Place 1 drop into both eyes every morning.   ELDERBERRY PO Take by mouth.   gabapentin 300 MG capsule Commonly known as: NEURONTIN TAKE 1 CAPSULE BY MOUTH 3  TIMES DAILY AND MAY TAKE 2  CAPSULES BY MOUTH AT  BEDTIME IF MORE BOTHERSOME  AT NIGHT   ibuprofen 800 MG tablet Commonly known as: ADVIL TAKE 1 TABLET BY MOUTH EVERY 8 HOURS WITH FOOD AS NEEDED FOR PAIN   Iron 325 (65 Fe) MG Tabs Take 1 tablet (325 mg total) by mouth 2 (two) times daily.   latanoprost 0.005 % ophthalmic solution Commonly known as: XALATAN Place 1 drop into both eyes at bedtime.   letrozole 2.5 MG tablet Commonly known as: FEMARA TAKE 1 TABLET BY MOUTH  DAILY   omeprazole 20 MG capsule Commonly known as: PRILOSEC Take 1 capsule (20 mg total) by mouth daily.   Polyethyl Glycol-Propyl Glycol 0.4-0.3 % Soln Apply to eye.   potassium chloride SA 20 MEQ tablet Commonly known as:  KLOR-CON Take 1 tablet (20 mEq total) by mouth 2 (two) times daily.   rizatriptan 10 MG tablet Commonly known as: MAXALT SMARTSIG:1 Tablet(s) By Mouth 1 to 2 Times Daily   traZODone 50 MG tablet Commonly known as: DESYREL Take 50 mg by mouth at bedtime.   vitamin C 500 MG tablet Commonly known as: ASCORBIC ACID Take 500 mg by mouth daily.   zinc gluconate 50 MG tablet Take 50 mg by mouth daily.        Signature:  Chesley Mires, MD [place] Pager - [phone] 01/11/2021, 12:28 PM

## 2021-01-11 NOTE — Patient Instructions (Signed)
Ask your dentist about whether using an oral appliance is an option for treating your sleep apnea  Will provide you with contact information for [place] weight loss program  Will arrange for auto CPAP set up  Follow up in 4 to 5 months

## 2021-01-24 DIAGNOSIS — G4733 Obstructive sleep apnea (adult) (pediatric): Secondary | ICD-10-CM | POA: Insufficient documentation

## 2021-03-13 ENCOUNTER — Telehealth: Payer: Self-pay | Admitting: Pulmonary Disease

## 2021-03-13 NOTE — Telephone Encounter (Signed)
ATC patient to get her scheduled for an apptointment now that she has been set up with her CPAP machine, LMTCB   When patient calls back please schedule her for an OV in [place] with either Dr. Halford Chessman or APP.

## 2021-03-20 NOTE — Telephone Encounter (Signed)
Spoke to patient and scheduled OV for 04/20/21 at 10:00. Nothing further needed.

## 2021-04-20 ENCOUNTER — Encounter: Payer: Self-pay | Admitting: Primary Care

## 2021-04-20 ENCOUNTER — Ambulatory Visit (INDEPENDENT_AMBULATORY_CARE_PROVIDER_SITE_OTHER): Payer: Medicare Other | Admitting: Primary Care

## 2021-04-20 ENCOUNTER — Other Ambulatory Visit: Payer: Self-pay

## 2021-04-20 VITALS — BP 120/82 | HR 56 | Temp 97.3°F | Ht 69.0 in | Wt 331.6 lb

## 2021-04-20 DIAGNOSIS — G4733 Obstructive sleep apnea (adult) (pediatric): Secondary | ICD-10-CM | POA: Insufficient documentation

## 2021-04-20 DIAGNOSIS — Z9989 Dependence on other enabling machines and devices: Secondary | ICD-10-CM

## 2021-04-20 NOTE — Assessment & Plan Note (Addendum)
-   HST 11/26/20 >> AHI 28.1, SpO2 low 72%. Patient is 100% compliant with CPAP use > 4 hours and reports benefit from wearing. She is having some mild issues with airleaks. Pressure 5-15cm h20; Residual AHI 2.4. Airleaks 4.8L/min. We will place an order to see if her DME company[employer] can provide her with mask liners. Encourage weight loss efforts. Advised against driving if experiencing excessive daytime sleepiness. FU in 1 year or sooner if needed.

## 2021-04-20 NOTE — Progress Notes (Signed)
@Patient  ID: Michelle Mcmillan, female    DOB: 1970-05-15, 51 y.o.   MRN: [id number]  Chief Complaint  Patient presents with   Follow-up    Referring provider: Donnie Coffin, MD  HPI: 51 year old female, never smoked. PMH significant for OSA, HTN, GERD, breast cancer s/p mastectomy, morbid obesity. HST 11/26/20 >> AHI 28.1, SpO2 low 72%.   04/20/2021 Patient presents today for 3 month follow-up after CPAP start. She is doing well today. No acute complaints. She is 100% compliant with CPAP use. She is sleeping better at night and not as tired during the day. No issues with pressure setting or mask fit other than occasional airleak. She is using a hybrid full face mask size medium. She recently has started to exercise and is working on weight loss.   Airview download 03/19/21-04/17/21 Usage 30/30 days; 100% > 4 hours Average usage 8 hours 24 mins Pressure 5-15cm h20 (14.3cm h20-95%) Airleaks 4.8L/min (95%) AHI 2.4  Allergies  Allergen Reactions   Nitroglycerin Other (See Comments)    Headache, n/v.    Immunization History  Administered Date(s) Administered   Influenza,inj,Quad PF,6+ Mos 02/10/2018   PFIZER(Purple Top)SARS-COV-2 Vaccination 06/30/2019, 07/25/2019    Past Medical History:  Diagnosis Date   Anemia    H/O   Anemia    Arthritis    RIGHT KNEE   Breast cancer ([place]) 2017   right breast   Cancer ([place])    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)   Dyspnea    GERD (gastroesophageal reflux disease)    NO MEDS   Headache    migraines   Hypertension    Last menstrual period (LMP) > 10 days ago 08/2015   Lower extremity edema    Neuropathy    OSA (obstructive sleep apnea)    Personal history of radiation therapy     Tobacco History: Social History   Tobacco Use  Smoking Status Never  Smokeless Tobacco Never   Counseling given: Not Answered   Outpatient Medications Prior to Visit  Medication Sig Dispense Refill   BAC 50-325-40 MG tablet Take 1-2 tablets by mouth  every 4 (four) hours as needed.     buPROPion (WELLBUTRIN XL) 150 MG 24 hr tablet Take 150 mg by mouth daily.     butalbital-aspirin-caffeine (FIORINAL) 50-325-40 MG capsule Take 1 capsule by mouth as needed for headache.     calcium-vitamin D (OSCAL WITH D) 500-200 MG-UNIT TABS tablet Take 1 tablet by mouth 2 (two) times daily. With meals     diclofenac sodium (VOLTAREN) 1 % GEL Apply 4 g topically 4 (four) times daily.     dorzolamide-timolol (COSOPT) 22.3-6.8 MG/ML ophthalmic solution Place 1 drop into both eyes every morning.     ELDERBERRY PO Take by mouth.     Ferrous Sulfate (IRON) 325 (65 Fe) MG TABS Take 1 tablet (325 mg total) by mouth 2 (two) times daily. 60 each 2   gabapentin (NEURONTIN) 300 MG capsule TAKE 1 CAPSULE BY MOUTH 3  TIMES DAILY AND MAY TAKE 2  CAPSULES BY MOUTH AT  BEDTIME IF MORE BOTHERSOME  AT NIGHT 450 capsule 0   ibuprofen (ADVIL) 800 MG tablet TAKE 1 TABLET BY MOUTH EVERY 8 HOURS WITH FOOD AS NEEDED FOR PAIN     latanoprost (XALATAN) 0.005 % ophthalmic solution Place 1 drop into both eyes at bedtime.     letrozole (FEMARA) 2.5 MG tablet TAKE 1 TABLET BY MOUTH  DAILY 90 tablet 3   omeprazole (PRILOSEC)  20 MG capsule Take 1 capsule (20 mg total) by mouth daily. 30 capsule 3   Polyethyl Glycol-Propyl Glycol 0.4-0.3 % SOLN Apply to eye.     potassium chloride SA (K-DUR,KLOR-CON) 20 MEQ tablet Take 1 tablet (20 mEq total) by mouth 2 (two) times daily. 30 tablet 1   rizatriptan (MAXALT) 10 MG tablet SMARTSIG:1 Tablet(s) By Mouth 1 to 2 Times Daily     traZODone (DESYREL) 50 MG tablet Take 50 mg by mouth at bedtime.     vitamin C (ASCORBIC ACID) 500 MG tablet Take 500 mg by mouth daily.     zinc gluconate 50 MG tablet Take 50 mg by mouth daily.     No facility-administered medications prior to visit.   Review of Systems  Review of Systems  Constitutional: Negative.  Negative for fatigue.  HENT: Negative.    Respiratory: Negative.    Psychiatric/Behavioral:  Negative.  Negative for sleep disturbance.     Physical Exam  BP 120/82 (BP Location: Left Arm, Patient Position: Sitting, Cuff Size: Normal)    Pulse (!) 56    Temp (!) 97.3 F (36.3 C) (Oral)    Ht 5\' 9"  (1.753 m)    Wt (!) 331 lb 9.6 oz (150.4 kg)    SpO2 98%    BMI 48.97 kg/m  Physical Exam Constitutional:      Appearance: Normal appearance.  HENT:     Head: Normocephalic and atraumatic.     Mouth/Throat:     Mouth: Mucous membranes are moist.     Pharynx: Oropharynx is clear.  Cardiovascular:     Rate and Rhythm: Normal rate and regular rhythm.  Pulmonary:     Effort: Pulmonary effort is normal.     Breath sounds: Normal breath sounds. No wheezing, rhonchi or rales.  Musculoskeletal:        General: Normal range of motion.  Skin:    General: Skin is warm and dry.  Neurological:     General: No focal deficit present.     Mental Status: She is alert and oriented to person, place, and time. Mental status is at baseline.  Psychiatric:        Mood and Affect: Mood normal.        Behavior: Behavior normal.        Thought Content: Thought content normal.        Judgment: Judgment normal.     Lab Results:  CBC    Component Value Date/Time   WBC 7.9 06/02/2018 0203   RBC 4.07 06/02/2018 0203   HGB 9.5 (L) 06/02/2018 0203   HCT 31.6 (L) 06/02/2018 0203   PLT 216 06/02/2018 0203   MCV 77.6 (L) 06/02/2018 0203   MCH 23.3 (L) 06/02/2018 0203   MCHC 30.1 06/02/2018 0203   RDW 20.2 (H) 06/02/2018 0203   LYMPHSABS 1.0 10/21/2017 0944   MONOABS 0.4 10/21/2017 0944   EOSABS 0.2 10/21/2017 0944   BASOSABS 0.0 10/21/2017 0944    BMET    Component Value Date/Time   NA 141 01/25/2019 1100   K 3.8 01/25/2019 1100   CL 102 01/25/2019 1100   CO2 30 01/25/2019 1100   GLUCOSE 88 01/25/2019 1100   BUN 18 01/25/2019 1100   CREATININE 1.07 (H) 01/25/2019 1100   CALCIUM 9.3 01/25/2019 1100   GFRNONAA >60 01/25/2019 1100   GFRAA >60 01/25/2019 1100    BNP No results found  for: BNP  ProBNP No results found for: PROBNP  Imaging: No  results found.   Assessment & Plan:   OSA on CPAP - HST 11/26/20 >> AHI 28.1, SpO2 low 72%. Patient is 100% compliant with CPAP use > 4 hours and reports benefit from wearing. She is having some mild issues with airleaks. Pressure 5-15cm h20; Residual AHI 2.4. Airleaks 4.8L/min. We will place an order to see if her DME company[employer] can provide her with mask liners. Encourage weight loss efforts. Advised against driving if experiencing excessive daytime sleepiness. FU in 1 year or sooner if needed.    Martyn Ehrich, NP 04/20/2021

## 2021-04-20 NOTE — Patient Instructions (Addendum)
Recommendations: - Continue to wear CPAP every night for min 4-6 hours or longer - Work on weight loss, do not drive if tired  - Look into getting CPAP liners either through [employer] or [employer] (I placed DME order) - If your water chamber runs out at night it will still work but you will longer be getting humidification, try lowering your settings to 3.   Follow-up: - 1 year with Dr. Halford Chessman or sooner if needed   CPAP and BIPAP Information CPAP and BIPAP are methods that use air pressure to keep your airways open and to help you breathe well. CPAP and BIPAP use different amounts of pressure. Your health care provider will tell you whether CPAP or BIPAP would be more helpful for you. CPAP stands for "continuous positive airway pressure." With CPAP, the amount of pressure stays the same while you breathe in (inhale) and out (exhale). BIPAP stands for "bi-level positive airway pressure." With BIPAP, the amount of pressure will be higher when you inhale and lower when you exhale. This allows you to take larger breaths. CPAP or BIPAP may be used in the hospital, or your health care provider may want you to use it at home. You may need to have a sleep study before your health care provider can order a machine for you to use at home. What are the advantages? CPAP or BIPAP can be helpful if you have: Sleep apnea. Chronic obstructive pulmonary disease (COPD). Heart failure. Medical conditions that cause muscle weakness, including muscular dystrophy or amyotrophic lateral sclerosis (ALS). Other problems that cause breathing to be shallow, weak, abnormal, or difficult. CPAP and BIPAP are most commonly used for obstructive sleep apnea (OSA) to keep the airways from collapsing when the muscles relax during sleep. What are the risks? Generally, this is a safe treatment. However, problems may occur, including: Irritated skin or skin sores if the mask does not fit properly. Dry or stuffy nose or  nosebleeds. Dry mouth. Feeling gassy or bloated. Sinus or lung infection if the equipment is not cleaned properly. When should CPAP or BIPAP be used? In most cases, the mask only needs to be worn during sleep. Generally, the mask needs to be worn throughout the night and during any daytime naps. People with certain medical conditions may also need to wear the mask at other times, such as when they are awake. Follow instructions from your health care provider about when to use the machine. What happens during CPAP or BIPAP? Both CPAP and BIPAP are provided by a small machine with a flexible plastic tube that attaches to a plastic mask that you wear. Air is blown through the mask into your nose or mouth. The amount of pressure that is used to blow the air can be adjusted on the machine. Your health care provider will set the pressure setting and help you find the best mask for you. Tips for using the mask Because the mask needs to be snug, some people feel trapped or closed-in (claustrophobic) when first using the mask. If you feel this way, you may need to get used to the mask. One way to do this is to hold the mask loosely over your nose or mouth and then gradually apply the mask more snugly. You can also gradually increase the amount of time that you use the mask. Masks are available in various types and sizes. If your mask does not fit well, talk with your health care provider about getting a different one.  Some common types of masks include: Full face masks, which fit over the mouth and nose. Nasal masks, which fit over the nose. Nasal pillow or prong masks, which fit into the nostrils. If you are using a mask that fits over your nose and you tend to breathe through your mouth, a chin strap may be applied to help keep your mouth closed. Use a skin barrier to protect your skin as told by your health care provider. Some CPAP and BIPAP machines have alarms that may sound if the mask comes off or  develops a leak. If you have trouble with the mask, it is very important that you talk with your health care provider about finding a way to make the mask easier to tolerate. Do not stop using the mask. There could be a negative impact on your health if you stop using the mask. Tips for using the machine Place your CPAP or BIPAP machine on a secure table or stand near an electrical outlet. Know where the on/off switch is on the machine. Follow instructions from your health care provider about how to set the pressure on your machine and when you should use it. Do not eat or drink while the CPAP or BIPAP machine is on. Food or fluids could get pushed into your lungs by the pressure of the CPAP or BIPAP. For home use, CPAP and BIPAP machines can be rented or purchased through home health care companies. Many different brands of machines are available. Renting a machine before purchasing may help you find out which particular machine works well for you. Your health insurance company may also decide which machine you may get. Keep the CPAP or BIPAP machine and attachments clean. Ask your health care provider for specific instructions. Check the humidifier if you have a dry stuffy nose or nosebleeds. Make sure it is working correctly. Follow these instructions at home: Take over-the-counter and prescription medicines only as told by your health care provider. Ask if you can take sinus medicine if your sinuses are blocked. Do not use any products that contain nicotine or tobacco. These products include cigarettes, chewing tobacco, and vaping devices, such as e-cigarettes. If you need help quitting, ask your health care provider. Keep all follow-up visits. This is important. Contact a health care provider if: You have redness or pressure sores on your head, face, mouth, or nose from the mask or head gear. You have trouble using the CPAP or BIPAP machine. You cannot tolerate wearing the CPAP or BIPAP  mask. Someone tells you that you snore even when wearing your CPAP or BIPAP. Get help right away if: You have trouble breathing. You feel confused. Summary CPAP and BIPAP are methods that use air pressure to keep your airways open and to help you breathe well. If you have trouble with the mask, it is very important that you talk with your health care provider about finding a way to make the mask easier to tolerate. Do not stop using the mask. There could be a negative impact to your health if you stop using the mask. Follow instructions from your health care provider about when to use the machine. This information is not intended to replace advice given to you by your health care provider. Make sure you discuss any questions you have with your health care provider. Document Revised: 10/25/2020 Document Reviewed: 02/25/2020 Elsevier Patient Education  2022 [employer].

## 2021-04-23 NOTE — Progress Notes (Signed)
Reviewed and agree with assessment/plan.   Chesley Mires, MD [facility] Pulmonary/Critical Care 04/23/2021, 8:51 AM Pager:  [phone]

## 2021-06-13 ENCOUNTER — Encounter: Payer: Self-pay | Admitting: Oncology

## 2021-06-20 ENCOUNTER — Ambulatory Visit (INDEPENDENT_AMBULATORY_CARE_PROVIDER_SITE_OTHER): Payer: Medicare Other | Admitting: Bariatrics

## 2021-06-20 ENCOUNTER — Encounter (INDEPENDENT_AMBULATORY_CARE_PROVIDER_SITE_OTHER): Payer: Self-pay | Admitting: Bariatrics

## 2021-06-20 ENCOUNTER — Encounter: Payer: Self-pay | Admitting: Oncology

## 2021-06-20 ENCOUNTER — Other Ambulatory Visit: Payer: Self-pay

## 2021-06-20 VITALS — BP 148/86 | HR 61 | Temp 98.1°F | Ht 69.0 in | Wt 333.0 lb

## 2021-06-20 DIAGNOSIS — G43009 Migraine without aura, not intractable, without status migrainosus: Secondary | ICD-10-CM

## 2021-06-20 DIAGNOSIS — Z1331 Encounter for screening for depression: Secondary | ICD-10-CM | POA: Diagnosis not present

## 2021-06-20 DIAGNOSIS — G4733 Obstructive sleep apnea (adult) (pediatric): Secondary | ICD-10-CM

## 2021-06-20 DIAGNOSIS — Z6841 Body Mass Index (BMI) 40.0 and over, adult: Secondary | ICD-10-CM

## 2021-06-20 DIAGNOSIS — R7303 Prediabetes: Secondary | ICD-10-CM

## 2021-06-20 DIAGNOSIS — R0602 Shortness of breath: Secondary | ICD-10-CM | POA: Diagnosis not present

## 2021-06-20 DIAGNOSIS — E559 Vitamin D deficiency, unspecified: Secondary | ICD-10-CM

## 2021-06-20 DIAGNOSIS — K219 Gastro-esophageal reflux disease without esophagitis: Secondary | ICD-10-CM | POA: Diagnosis not present

## 2021-06-20 DIAGNOSIS — I10 Essential (primary) hypertension: Secondary | ICD-10-CM | POA: Diagnosis not present

## 2021-06-20 DIAGNOSIS — E66813 Obesity, class 3: Secondary | ICD-10-CM

## 2021-06-20 DIAGNOSIS — Z9989 Dependence on other enabling machines and devices: Secondary | ICD-10-CM

## 2021-06-20 DIAGNOSIS — E668 Other obesity: Secondary | ICD-10-CM

## 2021-06-20 DIAGNOSIS — R5383 Other fatigue: Secondary | ICD-10-CM | POA: Diagnosis not present

## 2021-06-20 DIAGNOSIS — D509 Iron deficiency anemia, unspecified: Secondary | ICD-10-CM

## 2021-06-21 ENCOUNTER — Encounter (INDEPENDENT_AMBULATORY_CARE_PROVIDER_SITE_OTHER): Payer: Self-pay | Admitting: Bariatrics

## 2021-06-21 DIAGNOSIS — R7303 Prediabetes: Secondary | ICD-10-CM | POA: Insufficient documentation

## 2021-06-21 DIAGNOSIS — R7989 Other specified abnormal findings of blood chemistry: Secondary | ICD-10-CM | POA: Insufficient documentation

## 2021-06-21 LAB — COMPREHENSIVE METABOLIC PANEL
ALT: 13 IU/L (ref 0–32)
AST: 18 IU/L (ref 0–40)
Albumin/Globulin Ratio: 1.4 (ref 1.2–2.2)
Albumin: 3.7 g/dL — ABNORMAL LOW (ref 3.8–4.9)
Alkaline Phosphatase: 111 IU/L (ref 44–121)
BUN/Creatinine Ratio: 15 (ref 9–23)
BUN: 16 mg/dL (ref 6–24)
Bilirubin Total: <0.2 mg/dL (ref 0.0–1.2)
CO2: 27 mmol/L (ref 20–29)
Calcium: 9 mg/dL (ref 8.7–10.2)
Chloride: 103 mmol/L (ref 96–106)
Creatinine, Ser: 1.07 mg/dL — ABNORMAL HIGH (ref 0.57–1.00)
Globulin, Total: 2.6 g/dL (ref 1.5–4.5)
Glucose: 94 mg/dL (ref 70–99)
Potassium: 4.1 mmol/L (ref 3.5–5.2)
Sodium: 143 mmol/L (ref 134–144)
Total Protein: 6.3 g/dL (ref 6.0–8.5)
eGFR: 63 mL/min/{1.73_m2} (ref >59)

## 2021-06-21 LAB — LIPID PANEL WITH LDL/HDL RATIO
Cholesterol, Total: 179 mg/dL (ref 100–199)
HDL: 65 mg/dL (ref >39)
LDL Chol Calc (NIH): 105 mg/dL — ABNORMAL HIGH (ref 0–99)
LDL/HDL Ratio: 1.6 ratio (ref 0.0–3.2)
Triglycerides: 47 mg/dL (ref 0–149)
VLDL Cholesterol Cal: 9 mg/dL (ref 5–40)

## 2021-06-21 LAB — TSH+T4F+T3FREE
Free T4: 1.14 ng/dL (ref 0.82–1.77)
T3, Free: 2.9 pg/mL (ref 2.0–4.4)
TSH: 2.02 u[IU]/mL (ref 0.450–4.500)

## 2021-06-21 LAB — VITAMIN D 25 HYDROXY (VIT D DEFICIENCY, FRACTURES): Vit D, 25-Hydroxy: 45.2 ng/mL (ref 30.0–100.0)

## 2021-06-21 LAB — ANEMIA PANEL
Ferritin: 366 ng/mL — ABNORMAL HIGH (ref 15–150)
Folate, Hemolysate: 314 ng/mL
Folate, RBC: 865 ng/mL (ref >498)
Hematocrit: 36.3 % (ref 34.0–46.6)
Iron Saturation: 21 % (ref 15–55)
Iron: 47 ug/dL (ref 27–159)
Retic Ct Pct: 1.7 % (ref 0.6–2.6)
Total Iron Binding Capacity: 222 ug/dL — ABNORMAL LOW (ref 250–450)
UIBC: 175 ug/dL (ref 131–425)
Vitamin B-12: 572 pg/mL (ref 232–1245)

## 2021-06-21 LAB — INSULIN, RANDOM: INSULIN: 11.8 u[IU]/mL (ref 2.6–24.9)

## 2021-06-21 LAB — HEMOGLOBIN A1C
Est. average glucose Bld gHb Est-mCnc: 126 mg/dL
Hgb A1c MFr Bld: 6 % — ABNORMAL HIGH (ref 4.8–5.6)

## 2021-06-23 NOTE — Progress Notes (Signed)
? ? ? ?Chief Complaint:  ? ?OBESITY ?Michelle Mcmillan (MR# [id number]) is a 51 y.o. female who presents for evaluation and treatment of obesity and related comorbidities. Current BMI is Body mass index is 49.18 kg/m?Marland Kitchen Michelle Mcmillan has been struggling with her weight for many years and has been unsuccessful in either losing weight, maintaining weight loss, or reaching her healthy weight goal. ? ?Michelle Mcmillan does like to cook but notes obstacles, such as breaking old eating habits.  ? ?Michelle Mcmillan is currently in the action stage of change and ready to dedicate time achieving and maintaining a healthier weight. Michelle Mcmillan is interested in becoming our patient and working on intensive lifestyle modifications including (but not limited to) diet and exercise for weight loss. ? ?Michelle Mcmillan's habits were reviewed today and are as follows: Her family eats meals together, she thinks her family will eat healthier with her, she struggles with family and or coworkers weight loss sabotage, her desired weight loss is 123 pounds, she has been heavy most of her life, she started gaining weight after pregnancies and chemo, her heaviest weight ever was 358 pounds, she is a picky eater and doesn't like to eat healthier foods, she has significant food cravings issues, she snacks frequently in the evenings, she skips meals frequently, she is frequently drinking liquids with calories, she frequently makes poor food choices, she frequently eats larger portions than normal, and she struggles with emotional eating. ? ?Depression Screen ?Michelle Mcmillan's Food and Mood (modified PHQ-9) score was 18. ? ? ?  06/20/2021  ?  7:54 AM  ?Depression screen PHQ 2/9  ?Decreased Interest 2  ?Down, Depressed, Hopeless 3  ?PHQ - 2 Score 5  ?Altered sleeping 2  ?Tired, decreased energy 3  ?Change in appetite 2  ?Feeling bad or failure about yourself  3  ?Trouble concentrating 2  ?Moving slowly or fidgety/restless 1  ?Suicidal thoughts 0  ?PHQ-9 Score 18  ?Difficult doing work/chores  Somewhat difficult  ? ?Subjective:  ? ?1. Other fatigue ?Michelle Mcmillan will continue activities. Michelle Mcmillan admits to daytime somnolence and admits to waking up still tired. Patient has a history of symptoms of morning fatigue and morning headache. Michelle Mcmillan generally gets 7 hours of sleep per night, and states that she has difficulty falling asleep and generally restful sleep. Snoring is present. Apneic episodes are not present. Epworth Sleepiness Score is 7.   ? ?2. SOB (shortness of breath) on exertion ?Michelle Mcmillan will continue activities. Michelle Mcmillan notes increasing shortness of breath with exercising and seems to be worsening over time with weight gain. She notes getting out of breath sooner with activity than she used to. This has not gotten worse recently. Michelle Mcmillan denies shortness of breath at rest or orthopnea.  ? ?3. Primary hypertension ?Michelle Mcmillan notes low blood pressures with medications. She is currently not on medications. She has been on amlodipine and HCTZ in the past.  ? ?4. OSA on CPAP ?Michelle Mcmillan uses a CPAP for restful sleep at night.  ? ?5. Gastroesophageal reflux disease without esophagitis ?Michelle Mcmillan is taking Omeprazole currently.  ? ?6. Migraine without aura and without status migrainosus, not intractable ?Michelle Mcmillan is currently taking Rizatriptan and Fiorinal. ? ?7. Pre-diabetes ?Michelle Mcmillan is not on medications currently.  ? ?8. Vitamin D deficiency ?Michelle Mcmillan is not on Vitamin D currently. ? ?9. Microcytic anemia ?Michelle Mcmillan is currently taking iron.  ? ?Assessment/Plan:  ? ?1. Other fatigue ?Michelle Mcmillan will gradually incrase activities over time. We will check thyroid panel. Michelle Mcmillan does feel that her weight is causing her energy to  be lower than it should be. Fatigue may be related to obesity, depression or many other causes. Labs will be ordered, and in the meanwhile, Michelle Mcmillan will focus on self care including making healthy food choices, increasing physical activity and focusing on stress reduction.  ?- EKG 12-Lead ?-  TSH+T4F+T3Free ? ?2. SOB (shortness of breath) on exertion ?Michelle Mcmillan will gradually increase activities exercise over time. We will check thyroid panel. Michelle Mcmillan does feel that she gets out of breath more easily that she used to when she exercises. Michelle Mcmillan's shortness of breath appears to be obesity related and exercise induced. She has agreed to work on weight loss and gradually increase exercise to treat her exercise induced shortness of breath. Will continue to monitor closely.  ?- TSH+T4F+T3Free ? ?3. Primary hypertension ?We will watch over time. We will check CMP today. Michelle Mcmillan is working on healthy weight loss and exercise to improve blood pressure control. We will watch for signs of hypotension as she continues her lifestyle modifications. ? ?- Comprehensive metabolic panel ? ?4. OSA on CPAP ?Michelle Mcmillan will continue to use her CPAP at night. Intensive lifestyle modifications are the first line treatment for this issue. We discussed several lifestyle modifications today and she will continue to work on diet, exercise and weight loss efforts. We will continue to monitor. Orders and follow up as documented in patient record.   ? ?5. Gastroesophageal reflux disease without esophagitis ?Intensive lifestyle modifications are the first line treatment for this issue. Michelle Mcmillan will continue taking Omeprazole. We discussed several lifestyle modifications today and she will continue to work on diet, exercise and weight loss efforts. Orders and follow up as documented in patient record.  ? ?Counseling ?If a person has gastroesophageal reflux disease (GERD), food and stomach acid move back up into the esophagus and cause symptoms or problems such as damage to the esophagus. ?Anti-reflux measures include: raising the head of the bed, avoiding tight clothing or belts, avoiding eating late at night, not lying down shortly after mealtime, and achieving weight loss. ?Avoid ASA, NSAID's, caffeine, alcohol, and tobacco.  ?OTC Pepcid  and/or Tums are often very helpful for as needed use.  ?However, for persisting chronic or daily symptoms, stronger medications like Omeprazole may be needed. ?You may need to avoid foods and drinks such as: ?Coffee and tea (with or without caffeine). ?Drinks that contain alcohol. ?Energy drinks and sports drinks. ?Bubbly (carbonated) drinks or sodas. ?Chocolate and cocoa. ?Peppermint and mint flavorings. ?Garlic and onions. ?Horseradish. ?Spicy and acidic foods. These include peppers, chili powder, curry powder, vinegar, hot sauces, and BBQ sauce. ?Citrus fruit juices and citrus fruits, such as oranges, lemons, and limes. ?Tomato-based foods. These include red sauce, chili, salsa, and pizza with red sauce. ?Fried and fatty foods. These include donuts, french fries, potato chips, and high-fat dressings. ?High-fat meats. These include hot dogs, rib eye steak, sausage, ham, and bacon.  ? ?6. Migraine without aura and without status migrainosus, not intractable ?Michelle Mcmillan will continue taking her medications.  ? ?7. Pre-diabetes ?We will check A1C, insulin, Lipids, and CMP today. Michelle Mcmillan will continue to work on weight loss, exercise, and decreasing simple carbohydrates to help decrease the risk of diabetes.  ? ?- Insulin, random ?- Hemoglobin A1c ?- Lipid Panel With LDL/HDL Ratio ?- Comprehensive metabolic panel ? ?8. Vitamin D deficiency ?Low Vitamin D level contributes to fatigue and are associated with obesity, breast, and colon cancer. We will check Vitamin D today and Michelle Mcmillan will follow-up for routine testing of Vitamin  D, at least 2-3 times per year to avoid over-replacement. ?- VITAMIN D 25 Hydroxy (Vit-D Deficiency, Fractures) ? ?9. Microcytic anemia ?Orders and follow up as documented in patient record. We will check anemia panel today.  ? ?Counseling ?Iron is essential for our bodies to make red blood cells.  Reasons that someone may be deficient include: an iron-deficient diet (more likely in those following  vegan or vegetarian diets), women with heavy menses, patients with GI disorders or poor absorption, patients that have had bariatric surgery, frequent blood donors, patients with cancer, and patients wi

## 2021-06-25 DIAGNOSIS — Z6841 Body Mass Index (BMI) 40.0 and over, adult: Secondary | ICD-10-CM | POA: Insufficient documentation

## 2021-06-25 DIAGNOSIS — E66813 Obesity, class 3: Secondary | ICD-10-CM | POA: Insufficient documentation

## 2021-07-04 ENCOUNTER — Encounter (INDEPENDENT_AMBULATORY_CARE_PROVIDER_SITE_OTHER): Payer: Self-pay | Admitting: Bariatrics

## 2021-07-04 ENCOUNTER — Telehealth: Payer: Self-pay | Admitting: *Deleted

## 2021-07-04 ENCOUNTER — Ambulatory Visit (INDEPENDENT_AMBULATORY_CARE_PROVIDER_SITE_OTHER): Payer: Medicare Other | Admitting: Bariatrics

## 2021-07-04 VITALS — BP 140/80 | HR 61 | Temp 98.0°F | Ht 69.0 in | Wt 324.0 lb

## 2021-07-04 DIAGNOSIS — K5909 Other constipation: Secondary | ICD-10-CM

## 2021-07-04 DIAGNOSIS — R7303 Prediabetes: Secondary | ICD-10-CM

## 2021-07-04 DIAGNOSIS — R7989 Other specified abnormal findings of blood chemistry: Secondary | ICD-10-CM | POA: Diagnosis not present

## 2021-07-04 DIAGNOSIS — D509 Iron deficiency anemia, unspecified: Secondary | ICD-10-CM | POA: Diagnosis not present

## 2021-07-04 DIAGNOSIS — Z6841 Body Mass Index (BMI) 40.0 and over, adult: Secondary | ICD-10-CM

## 2021-07-04 DIAGNOSIS — E669 Obesity, unspecified: Secondary | ICD-10-CM

## 2021-07-04 NOTE — Telephone Encounter (Signed)
?Patient called reporting that she saw another doctor today who was concerned about her labs and she was told to contact us for an appointment ASAP. Patient last see a year ago and the disposition looks like it was not handled ? ? ?Follow-up and Disposition History ? ?06/30/2020 1527 - Jacquelin Hawking, NP  ?Check-out note: Disposition:  ?-Referral to lymphedema clinic.  ?-Referral to neuro oncology for migraines, confusion, brain fog  ?-Repeat DEXA in 6 months.  ?-RTC in 6 months with follow-up and MD assessment .   ?Anemia panel ?Order: [id number] ?Status: Final result    ?Visible to patient: Yes (seen)    ?Next appt: 07/19/2021 at 03:30 PM in Family Medicine (Esaw Grandchild, NP)    ?Dx: Microcytic anemia    ?0 Result Notes ?          ?Component Ref Range & Units 2 wk ago ?(06/20/21) 3 yr ago ?(06/02/18) 3 yr ago ?(05/27/18) 3 yr ago ?(02/09/18) 3 yr ago ?(02/03/18) 3 yr ago ?(10/21/17) 3 yr ago ?(09/16/17)  ?Total Iron Binding Capacity 250 - 450 ug/dL 222 Low          ?UIBC 131 - 425 ug/dL 175         ?Iron 27 - 159 ug/dL 47         ?Iron Saturation 15 - 55 % 21         ?Vitamin B-12 232 - 1,245 pg/mL 572         ?Folate, Hemolysate Not Estab. ng/mL 314.0         ?Hematocrit 34.0 - 46.6 % 36.3  31.6 Low  R  32.1 Low  R  36.7 R  37.9 R  33.3 Low  R  32.6 Low  R   ?Folate, RBC >498 ng/mL 865         ?Ferritin 15 - 150 ng/mL 366 High          ?Retic Ct Pct 0.6 - 2.6 % 1.7         ?Resulting Agency  [facility]  ?  ? ?  ?Narrative ?Performed by: Maryan Puls ?Performed at:  [facility]  ?[address], [place], [place]  [id number]  ?Lab Director: Rush Farmer MD, Phone:  [phone]  ?  ?Specimen Collected: 06/20/21 09:13 Last Resulted: 06/21/21 13:36  ?  ?  Lab Flowsheet   ? Order Details   ? View Encounter   ? Lab and Collection Details   ? Routing   ? Result History    ?View Encounter Conversation    ?  ?R=Reference range differs from displayed  range    ?  ?Result Care Coordination ? ? ?Patient Communication ? ? Add Comments   Seen Back to Top  ?  ?  ? ?Other Results from 06/20/2021 ? ?Insulin, random ?Order: [id number] ?Status: Final result    ?Visible to patient: Yes (seen)    ?Next appt: 07/19/2021 at 03:30 PM in Family Medicine (Esaw Grandchild, NP)    ?Dx: Pre-diabetes    ?0 Result Notes ?    ?Component Ref Range & Units 2 wk ago  ?INSULIN 2.6 - 24.9 uIU/mL 11.8   ?Resulting Agency  [facility]  ?  ? ?  ?Narrative ?Performed by: Maryan Puls ?Performed at:  [facility]  ?[address] [place], [place]  [id number]  ?Lab Director: Rush Farmer MD, Phone:  [phone]  ?  ?Specimen Collected: 06/20/21 09:13 Last Resulted: 06/21/21 13:36  ?  ?  Lab Flowsheet   ? Order Details   ? View Encounter   ? Lab and Collection Details   ? Routing   ? Result History    ?View Encounter Conversation    ?  ?  ?Result Care Coordination ? ? ?Patient Communication ? ? Add Comments   Seen Back to Top  ?  ?  ? ?  ? Contains abnormal data Hemoglobin A1c ?Order: [id number] ?Status: Final result    ?Visible to patient: Yes (seen)    ?Next appt: 07/19/2021 at 03:30 PM in Family Medicine (Esaw Grandchild, NP)    ?Dx: Pre-diabetes    ?0 Result Notes ?    ?Component Ref Range & Units 2 wk ago  ?Hgb A1c MFr Bld 4.8 - 5.6 % 6.0 High    ?Comment:          Prediabetes: 5.7 - 6.4  ?         Diabetes: >6.4  ?         Glycemic control for adults with diabetes: <7.0   ?Est. average glucose Bld gHb Est-mCnc mg/dL 126   ?Resulting Agency  [facility]  ?  ? ?  ?Narrative ?Performed by: Maryan Puls ?Performed at:  [facility]  ?[address], [place], [place]  [id number]  ?Lab Director: Rush Farmer MD, Phone:  [phone]  ?  ?Specimen Collected: 06/20/21 09:13 Last Resulted: 06/21/21 13:36  ?  ?  Lab Flowsheet   ? Order Details   ? View Encounter   ? Lab and Collection Details   ? Routing   ? Result History    ?View Encounter Conversation    ?  ?  ?Result Care Coordination ? ? ?Patient  Communication ? ? Add Comments   Seen Back to Top  ?  ?  ? ?  ? Contains abnormal data Lipid Panel With LDL/HDL Ratio ?Order: [id number] ?Status: Final result    ?Visible to patient: Yes (seen)    ?Next appt: 07/19/2021 at 03:30 PM in Family Medicine (Esaw Grandchild, NP)    ?Dx: Pre-diabetes    ?0 Result Notes ?    ?Component Ref Range & Units 2 wk ago  ?Cholesterol, Total 100 - 199 mg/dL 179   ?Triglycerides 0 - 149 mg/dL 47   ?HDL >39 mg/dL 65   ?VLDL Cholesterol Cal 5 - 40 mg/dL 9   ?LDL Chol Calc (NIH) 0 - 99 mg/dL 105 High    ?LDL/HDL Ratio 0.0 - 3.2 ratio 1.6   ?Comment:                                     LDL/HDL Ratio  ?                                            Men  Women  ?                              1/2 Avg.Risk  1.0    1.5  ?  Avg.Risk  3.6    3.2  ?                               2X Avg.Risk  6.2    5.0  ?                               3X Avg.Risk  8.0    6.1   ?Resulting Agency  [facility]  ?  ? ?  ?Narrative ?Performed by: Maryan Puls ?Performed at:  [facility]  ?[address], [place], [place]  [id number]  ?Lab Director: Rush Farmer MD, Phone:  [phone]  ?  ?Specimen Collected: 06/20/21 09:13 Last Resulted: 06/21/21 13:36  ?  ?  Lab Flowsheet   ? Order Details   ? View Encounter   ? Lab and Collection Details   ? Routing   ? Result History    ?View Encounter Conversation    ?  ?  ?Result Care Coordination ? ? ?Patient Communication ? ? Add Comments   Seen Back to Top  ?  ?  ? ?  ?TSH+T4F+T3Free ?Order: [id number] ?Status: Final result    ?Visible to patient: Yes (not seen)    ?Next appt: 07/19/2021 at 03:30 PM in Family Medicine (Esaw Grandchild, NP)    ?Dx: Other fatigue; SOB (shortness of brea...    ?0 Result Notes ?    ?Component Ref Range & Units 2 wk ago  ?TSH 0.450 - 4.500 uIU/mL 2.020   ?T3, Free 2.0 - 4.4 pg/mL 2.9   ?Free T4 0.82 - 1.77 ng/dL 1.14   ?Resulting Agency  [facility]  ?  ? ?  ?Narrative ?Performed by: Maryan Puls ?Performed at:  [facility]  ?[address] [place], [place]  [id number]  ?Lab Director: Rush Farmer MD, Phone:  [phone]  ?  ?Specimen Collected: 06/20/21 09:13 Last Resulted: 06/21/21 13:36  ?  ?  Lab Flowsheet   ? Order Details   ? View Encounter   ? Lab and Collection Details   ? Routing   ? Result History    ?View Encounter Conversation    ?  ?  ?Result Care Coordination ? ? ?Patient Communication ? ? Add Comments   Add Notifications  Back to Top  ?  ?  ? ?  ?VITAMIN D 25 Hydroxy (Vit-D Deficiency, Fractures) ?Order: [id number] ?Status: Final result    ?Visible to patient: Yes (seen)    ?Next appt: 07/19/2021 at 03:30 PM in Family Medicine (Esaw Grandchild, NP)    ?Dx: Vitamin D deficiency    ?0 Result Notes ?    ?Component Ref Range & Units 2 wk ago  ?Vit D, 25-Hydroxy 30.0 - 100.0 ng/mL 45.2   ?Comment: Vitamin D deficiency has been defined by the Institute of  ?Medicine and an Endocrine Society practice guideline as a  ?level of serum 25-OH vitamin D less than 20 ng/mL (1,2).  ?The Endocrine Society went on to further define vitamin D  ?insufficiency as a level between 21 and 29 ng/mL (2).  ?1. IOM (Institute of Medicine). 2010. Dietary reference  ?   intakes for calcium and D. [place]: The  ?   [employer].  ?2. Holick MF, [place], Bischoff-Ferrari HA, et al.  ?   Evaluation, treatment, and prevention of vitamin D  ?   deficiency: an  Endocrine Society clinical practice  ?   guideline. JCEM. 2011 Jul; 96(7):1911-30.   ?Resulting Agency  [facility]  ?  ? ?  ?Narrative ?Performed by: Maryan Puls ?Performed at:  [facility]  ?[address], [place], [place]  [id number]  ?Lab Director: Rush Farmer MD, Phone:  [phone]  ?  ?Specimen Collected: 06/20/21 09:13 Last Resulted: 06/21/21 13:36  ?  ?  Lab Flowsheet   ? Order Details   ? View Encounter   ? Lab and Collection Details   ? Routing   ? Result History    ?View Encounter Conversation    ?  ?  ?Result Care Coordination ? ? ?Patient  Communication ? ? Add Comments   Seen Back to Top  ?  ?  ? ?  ? Contains abnormal data Comprehensive metabolic panel ?Order: [id number] ?Status: Final result    ?Visible to patient: Yes (seen)    ?Next appt: 04/20/

## 2021-07-05 ENCOUNTER — Encounter: Payer: Self-pay | Admitting: Oncology

## 2021-07-05 ENCOUNTER — Telehealth: Payer: Medicare Other

## 2021-07-05 ENCOUNTER — Other Ambulatory Visit: Payer: Self-pay | Admitting: Emergency Medicine

## 2021-07-05 ENCOUNTER — Encounter (INDEPENDENT_AMBULATORY_CARE_PROVIDER_SITE_OTHER): Payer: Self-pay | Admitting: Bariatrics

## 2021-07-05 DIAGNOSIS — Z170 Estrogen receptor positive status [ER+]: Secondary | ICD-10-CM

## 2021-07-05 NOTE — Telephone Encounter (Signed)
Please review

## 2021-07-05 NOTE — Progress Notes (Signed)
? ? ? ?Chief Complaint:  ? ?OBESITY ?Raini is here to discuss her progress with her obesity treatment plan along with follow-up of her obesity related diagnoses. Shaundrea is on the Category 3 Plan and states she is following her eating plan approximately 100% of the time. Jailee states she is doing Spin class for 20 minutes 3 times per week and weights for 20 minutes 2 times per week. ? ?Today's visit was #: 2 ?Starting weight: 333 lbs ?Starting date: 06/20/2021 ?Today's weight: 324 lbs ?Today's date: 07/04/2021 ?Total lbs lost to date: 9 lbs ?Total lbs lost since last in-office visit: 9 lbs ? ?Interim History: Shane is down 9 lbs since her last visit. She did work the plan well.  ? ?Subjective:  ? ?1. High serum ferritin ?Kiasia is taking iron currently.Her ferritin lever was high which could be related to inflammation.  ?2. Pre-diabetes ?Roxan is not on medications.  ? ?3. Microcytic anemia ?Jozie's hemoglobin was 9.5/HCT=36.3. She is taking iron and increase ferritin.  ? ?4. Other constipation ?Lala will increase protein.  ? ?Assessment/Plan:  ? ?1. High serum ferritin. ? ? ?2. Pre-diabetes ?Jacinta will work on the plan and exercise. We discussed Metformin and handouts was provided today. She will continue to work on weight loss, exercise, and decreasing simple carbohydrates to help decrease the risk of diabetes.  ? ?3. Microcytic anemia ?Myrian will call Oncologist/Hematologist and get a follow up appointment.  ? ?4. Other constipation ?Anureet will increase her water intake. She will have Murelax most days per week. was informed that a decrease in bowel movement frequency is normal while losing weight, but stools should not be hard or painful. Orders and follow up as documented in patient record.  ? ?Counseling ?Getting to Good Bowel Health: Your goal is to have one soft bowel movement each day. Drink at least 8 glasses of water each day. Eat plenty of fiber (goal is over 25 grams each day). It is  best to get most of your fiber from dietary sources which includes leafy green vegetables, fresh fruit, and whole grains. You may need to add fiber with the help of OTC fiber supplements. These include Metamucil, Citrucel, and Flaxseed. If you are still having trouble, try adding Miralax or Magnesium Citrate. If all of these changes do not work, Cabin crew.  ? ?5. Obesity, current BMI 47.9 ?Joeleen is currently in the action stage of change. As such, her goal is to continue with weight loss efforts. She has agreed to the Category 3 Plan.  ? ?Margia will continue meal planning and she will continue intentional eating. We reviewed labs from 06/20/2021 CMP, Lipids, anemia panel, Vitamin D, B 12, A1C, insulin, and thyroid panel. She will keep protein and water intake high.  ? ?Exercise goals: As is. For substantial health benefits, adults should do at least 150 minutes (2 hours and 30 minutes) a week of moderate-intensity, or 75 minutes (1 hour and 15 minutes) a week of vigorous-intensity aerobic physical activity, or an equivalent combination of moderate- and vigorous-intensity aerobic activity. Aerobic activity should be performed in episodes of at least 10 minutes, and preferably, it should be spread throughout the week. ? ?Behavioral modification strategies: increasing lean protein intake, decreasing simple carbohydrates, increasing vegetables, increasing water intake, decreasing eating out, no skipping meals, meal planning and cooking strategies, keeping healthy foods in the home, and planning for success. ? ?Tiernan has agreed to follow-up with our clinic in 2 weeks. She was informed of the  importance of frequent follow-up visits to maximize her success with intensive lifestyle modifications for her multiple health conditions.  ? ?Objective:  ? ?Blood pressure 140/80, pulse 61, temperature 98 ?F (36.7 ?C), height '5\' 9"'$  (1.753 m), weight (!) 324 lb (147 kg), SpO2 98 %. ?Body mass index is 47.85  kg/m?. ? ?General: Cooperative, alert, well developed, in no acute distress. ?HEENT: Conjunctivae and lids unremarkable. ?Cardiovascular: Regular rhythm.  ?Lungs: Normal work of breathing. ?Neurologic: No focal deficits.  ? ?Lab Results  ?Component Value Date  ? CREATININE 1.07 (H) 06/20/2021  ? BUN 16 06/20/2021  ? NA 143 06/20/2021  ? K 4.1 06/20/2021  ? CL 103 06/20/2021  ? CO2 27 06/20/2021  ? ?Lab Results  ?Component Value Date  ? ALT 13 06/20/2021  ? AST 18 06/20/2021  ? ALKPHOS 111 06/20/2021  ? BILITOT <0.2 06/20/2021  ? ?Lab Results  ?Component Value Date  ? HGBA1C 6.0 (H) 06/20/2021  ? ?Lab Results  ?Component Value Date  ? INSULIN 11.8 06/20/2021  ? ?Lab Results  ?Component Value Date  ? TSH 2.020 06/20/2021  ? ?Lab Results  ?Component Value Date  ? CHOL 179 06/20/2021  ? HDL 65 06/20/2021  ? LDLCALC 105 (H) 06/20/2021  ? TRIG 47 06/20/2021  ? ?Lab Results  ?Component Value Date  ? VD25OH 45.2 06/20/2021  ? ?Lab Results  ?Component Value Date  ? WBC 7.9 06/02/2018  ? HGB 9.5 (L) 06/02/2018  ? HCT 36.3 06/20/2021  ? MCV 77.6 (L) 06/02/2018  ? PLT 216 06/02/2018  ? ?Lab Results  ?Component Value Date  ? IRON 47 06/20/2021  ? TIBC 222 (L) 06/20/2021  ? FERRITIN 366 (H) 06/20/2021  ? ?Attestation Statements:  ? ?Reviewed by clinician on day of visit: allergies, medications, problem list, medical history, surgical history, family history, social history, and previous encounter notes. ? ?I, Lizbeth Bark, RMA, am acting as transcriptionist for [employer], DO. ? ?I have reviewed the above documentation for accuracy and completeness, and I agree with the above. Jearld Lesch, DO ? ?

## 2021-07-09 ENCOUNTER — Encounter (INDEPENDENT_AMBULATORY_CARE_PROVIDER_SITE_OTHER): Payer: Self-pay | Admitting: Bariatrics

## 2021-07-13 ENCOUNTER — Encounter: Payer: Self-pay | Admitting: Oncology

## 2021-07-17 ENCOUNTER — Encounter: Payer: Self-pay | Admitting: Oncology

## 2021-07-19 ENCOUNTER — Ambulatory Visit (INDEPENDENT_AMBULATORY_CARE_PROVIDER_SITE_OTHER): Payer: Medicare Other | Admitting: Adult Health

## 2021-07-19 VITALS — BP 141/89 | HR 62 | Temp 98.1°F | Ht 69.0 in | Wt 324.0 lb

## 2021-07-19 DIAGNOSIS — R7989 Other specified abnormal findings of blood chemistry: Secondary | ICD-10-CM | POA: Diagnosis not present

## 2021-07-19 DIAGNOSIS — Z6841 Body Mass Index (BMI) 40.0 and over, adult: Secondary | ICD-10-CM | POA: Diagnosis not present

## 2021-07-19 DIAGNOSIS — E669 Obesity, unspecified: Secondary | ICD-10-CM

## 2021-07-19 DIAGNOSIS — M2560 Stiffness of unspecified joint, not elsewhere classified: Secondary | ICD-10-CM | POA: Diagnosis not present

## 2021-08-01 ENCOUNTER — Encounter: Payer: Self-pay | Admitting: Physical Therapy

## 2021-08-01 ENCOUNTER — Ambulatory Visit: Payer: Medicare Other | Attending: Adult Health | Admitting: Physical Therapy

## 2021-08-01 DIAGNOSIS — M6281 Muscle weakness (generalized): Secondary | ICD-10-CM | POA: Insufficient documentation

## 2021-08-01 DIAGNOSIS — M25611 Stiffness of right shoulder, not elsewhere classified: Secondary | ICD-10-CM | POA: Insufficient documentation

## 2021-08-01 DIAGNOSIS — G8929 Other chronic pain: Secondary | ICD-10-CM | POA: Insufficient documentation

## 2021-08-01 DIAGNOSIS — M2560 Stiffness of unspecified joint, not elsewhere classified: Secondary | ICD-10-CM | POA: Diagnosis not present

## 2021-08-01 DIAGNOSIS — M25511 Pain in right shoulder: Secondary | ICD-10-CM | POA: Insufficient documentation

## 2021-08-01 NOTE — Therapy (Addendum)
?OUTPATIENT PHYSICAL THERAPY SHOULDER EVALUATION ? ? ?Patient Name: Michelle Mcmillan ?MRN: [id number] ?DOB:11-Apr-1970, 51 y.o., female ?Today's Date: 08/03/2021 ? ? PT End of Session - 08/02/21 1631   ? ? Visit Number 1   ? Number of Visits 13   ? Date for PT Re-Evaluation 09/13/21   ? Authorization Type UHC Medicare/Medicaid, VL based on medical necessity   ? Progress Note Due on Visit 10   ? PT Start Time 0930   ? PT Stop Time 1015   ? PT Time Calculation (min) 45 min   ? Activity Tolerance Patient tolerated treatment well   ? Behavior During Therapy [facility] for tasks assessed/performed   ? ?  ?  ? ?  ? ? ?Past Medical History:  ?Diagnosis Date  ? Anemia   ? H/O  ? Anemia   ? Arthritis   ? RIGHT KNEE  ? Back pain   ? Breast cancer ([place]) 2017  ? right breast  ? Cancer ([place])   ? 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)  ? Carpal tunnel syndrome   ? Constipation   ? Depression   ? Dyspnea   ? GERD (gastroesophageal reflux disease)   ? NO MEDS  ? Headache   ? migraines  ? Hypertension   ? Joint pain   ? Last menstrual period (LMP) > 10 days ago 08/2015  ? Lower extremity edema   ? Migraines   ? Neuropathy   ? OSA (obstructive sleep apnea)   ? Personal history of radiation therapy   ? Prediabetes   ? SOB (shortness of breath)   ? ?Past Surgical History:  ?Procedure Laterality Date  ? BREAST BIOPSY Right 05/08/2017  ? invasive mamm. ca  ? BREAST EXCISIONAL BIOPSY Right 06/15/15  ? breast cancer  ? BREAST LUMPECTOMY WITH NEEDLE LOCALIZATION Right 06/15/2015  ? Procedure: BREAST LUMPECTOMY WITH NEEDLE LOCALIZATION;  Surgeon: Hubbard Robinson, MD;  Location: [facility];  Service: General;  Laterality: Right;  ? BREAST RECONSTRUCTION WITH PLACEMENT OF TISSUE EXPANDER AND FLEX HD (ACELLULAR HYDRATED DERMIS) Left 02/09/2018  ? Procedure: BREAST RECONSTRUCTION WITH PLACEMENT OF TISSUE EXPANDER AND FLEX HD (ACELLULAR HYDRATED DERMIS);  Surgeon: Wallace Going, DO;  Location: [facility];  Service: Plastics;  Laterality: Left;  ? BREAST SURGERY    ?  [place]  ? x1  ? COLONOSCOPY WITH PROPOFOL N/A 01/23/2017  ? Procedure: COLONOSCOPY WITH PROPOFOL;  Surgeon: Lin Landsman, MD;  Location[facility] ENDOSCOPY;  Service: Gastroenterology;  Laterality: N/A;  ? ESOPHAGOGASTRODUODENOSCOPY (EGD) WITH PROPOFOL N/A 01/23/2017  ? Procedure: ESOPHAGOGASTRODUODENOSCOPY (EGD) WITH PROPOFOL;  Surgeon: Lin Landsman, MD;  Location: [facility] ENDOSCOPY;  Service: Gastroenterology;  Laterality: N/A;  ? LATISSIMUS FLAP TO BREAST Right 06/01/2018  ? Procedure: RIGHT BREAST RECONSTRUCTION WITH LATISSIMUS MYOCUTANEOUS FLAP;  Surgeon: Wallace Going, DO;  Location: [place];  Service: Plastics;  Laterality: Right;  ? LIPOSUCTION WITH LIPOFILLING Bilateral 10/15/2018  ? Procedure: LIPOSUCTION TO BILATERAL BREASTS;  Surgeon: Wallace Going, DO;  Location: [place];  Service: Plastics;  Laterality: Bilateral;  ? LIPOSUCTION WITH LIPOFILLING Bilateral 04/22/2019  ? Procedure: fat grafting/liposuction and lipofilling bilateral breasts;  Surgeon: Wallace Going, DO;  Location: [place];  Service: Plastics;  Laterality: Bilateral;  ? MASS EXCISION N/A 05/20/2016  ? Procedure: EXCISION CHEST WALL MASS;  Surgeon: Florene Glen, MD;  Location: [facility];  Service: General;  Laterality: N/A;  ? RECONSTRUCTION BREAST W/ LATISSIMUS DORSI FLAP Right 06/01/2018  ?  REMOVAL OF BILATERAL TISSUE EXPANDERS WITH PLACEMENT OF BILATERAL BREAST IMPLANTS Bilateral 10/15/2018  ? Procedure: REMOVAL OF BILATERAL TISSUE EXPANDERS WITH PLACEMENT OF BILATERAL BREAST IMPLANTS;  Surgeon: Wallace Going, DO;  Location: [place];  Service: Plastics;  Laterality: Bilateral;  ? SCAR REVISION Bilateral 04/22/2019  ? Procedure: Excision of bilateral scar contracture;  Surgeon: Wallace Going, DO;  Location: [place];  Service: Plastics;  Laterality: Bilateral;  total case 90 min  ? SENTINEL NODE BIOPSY Right 06/15/2015   ? Procedure: SENTINEL NODE BIOPSY;  Surgeon: Hubbard Robinson, MD;  Location: [facility];  Service: General;  Laterality: Right;  ? TOTAL MASTECTOMY Right 05/22/2017  ? Procedure: TOTAL MASTECTOMY;  Surgeon: Florene Glen, MD;  Location: [facility];  Service: General;  Laterality: Right;  ? TOTAL MASTECTOMY Left 02/09/2018  ? Procedure: LEFT PROPHYLACTIC  MASTECTOMY;  Surgeon: Jovita Kussmaul, MD;  Location: [facility];  Service: General;  Laterality: Left;  ? TUBAL LIGATION  2004  ? ULNAR NERVE TRANSPOSITION Left 01/29/2019  ? Procedure: LEFT ULNAR NERVE DECOMPRESSION/TRANSPOSITION;  Surgeon: Leanora Cover, MD;  Location: [place];  Service: Orthopedics;  Laterality: Left;  ? ?Patient Active Problem List  ? Diagnosis Date Noted  ? Limited joint range of motion (ROM) 07/19/2021  ? Class 3 severe obesity due to excess calories with body mass index (BMI) of 45.0 to 49.9 in adult [facility]) 06/25/2021  ? Prediabetes 06/21/2021  ? Elevated ferritin level 06/21/2021  ? OSA on CPAP 04/20/2021  ? Migraine headache without aura 07/21/2020  ? Ulnar neuropathy 08/28/2018  ? S/P breast reconstruction, bilateral 06/16/2018  ? Acquired absence of right breast 06/01/2018  ? S/P mastectomy, bilateral 02/17/2018  ? Acquired absence of left breast 02/09/2018  ? Acquired absence of breast and absent nipple, right 01/02/2018  ? Postoperative breast asymmetry 01/02/2018  ? Intractable nausea and vomiting 08/11/2017  ? Breast cancer ([place]) 05/22/2017  ? Morbid (severe) obesity due to excess calories ([place]) 04/15/2017  ? Microcytic anemia 04/15/2017  ? GERD (gastroesophageal reflux disease) 04/15/2017  ? Chest wall mass   ? Malignant neoplasm of upper-outer quadrant of right female breast ([place]) 05/25/2015  ? HTN (hypertension) 05/24/2015  ? ? ?PCP: Donnie Coffin, MD ? ?REFERRING PROVIDER: Esaw Grandchild, NP ? ?REFERRING DIAG: M25.60 (ICD-10-CM) - Limited joint range of motion (ROM) ? ?THERAPY DIAG:  ?Chronic right shoulder  pain ? ?Stiffness of right shoulder, not elsewhere classified ? ?Muscle weakness (generalized) ? ? ?ONSET DATE: 05/22/2017 (change in status with R total mastectomy) ? ?SUBJECTIVE:                                                                                                                                                                                     ? ?  SUBJECTIVE STATEMENT: ?Patient is a 51 year old female with R shoulder mobility deficits and R hand weakness following R breast cancer and R total mastectomy in 2019.  ? ?PERTINENT HISTORY: ?Patient is a 51 year old female with R shoulder mobility deficits and R hand weakness. Pt reports undergoing mastectomy on her R side in 2019 and undergoing lymphedema therapy. She reports difficulty with accessing overhead motion on her R arm since then. Patient reports having to re-position arm when reaching overhead - she . She reports difficulty with her hands "locking up." Patient reports this is worse in her L hand presently. Pt has Hx of L ulnar nerve transposition in 2020 and Hx of carpel tunnel syndrome on Med Hx. She reports frequently dropping items when holding onto pen or kitchen items/spices. Patient is R hand dominant. Pt is in remission for breast cancer - since her surgery/chemo in fall of 2019. Pt reports fall off her front steps 9 months ago. Patient reports no imaging at the time; mainly integumentary injuries/scrapes. Pt has intermittent episodes of dizziness - pt describes it as "woozy" feeling and blurry vision; pt does not describe room spinning sensation. No unexplained weight loss. Pt reports intermittent disturbed sleep due to R hip pain.  ? ?PAIN:  ?Are you having pain? No ?No pain at rest, soreness along middle deltoid at rest ? ? ?PRECAUTIONS: None ? ?WEIGHT BEARING RESTRICTIONS No ? ?FALLS:  ?Has patient fallen in last 6 months? No ? ?LIVING ENVIRONMENT: ?Lives with: lives with her spouse, her neighbors are available to help ?Lives in:  House/apartment ? ? ?OCCUPATION: ?Disability  ? ?PLOF: Independent ? ?PATIENT GOALS  Pt wants more mobility in her R arm  ? ?OBJECTIVE:  ? ?DIAGNOSTIC FINDINGS:  ?None for R shoulder or R upper limb recently  ? ?P

## 2021-08-01 NOTE — Progress Notes (Signed)
? ? ? ?Chief Complaint:  ? ?OBESITY ?Michelle Mcmillan is here to discuss her progress with her obesity treatment plan along with follow-up of her obesity related diagnoses. Michelle Mcmillan is on the Category 3 Plan and states she is following her eating plan approximately 95% of the time. Michelle Mcmillan states she is doing cardio and bike spinning 60 minutes 3 times per week. ? ?Today's visit was #: 3 ?Starting weight: 333 lbs ?Starting date: 06/20/2021 ?Today's weight: 324 lbs ?Today's date: 07/19/21 ?Total lbs lost to date: 9 ?Total lbs lost since last in-office visit: 0 ? ?Interim History:  ?After chemotherapy tx for Breast Ca, it became more difficult to consume water, per Michelle Mcmillan.  ?She has had R breast Ca in 2017 and 2019.  ?She had a bilateral mastectomy in March 2019. ? ?Of Note: End of April/early May -obtain Iron /anemia profile.  ? ?Subjective:  ? ?1. Elevated ferritin level ?Michelle Mcmillan is currently taking OTC Elderberry 50 mg daily, Ferrous Sulfate 325 mg.  ?She has stopped taking Ascorbic Acid after 06/20/21, labs- ferritin level elevated: 366 (range 15-150). ? ?2. Limited joint range of motion (ROM) ?Michelle Mcmillan complains of increase right shoulder pain. ?She has been experiencing R shoulder stiffness and limited ROM intermittently since 2021.  ? ?Assessment/Plan:  ? ?1. Elevated ferritin level ?Michelle Mcmillan will remain off of Ascorbic Acid and we will check labs at end os April/early May. ? ?2. Limited joint range of motion (ROM) ?Referral was placed today. ? ?- Ambulatory referral to Physical Therapy ? ?3. Obesity, current BMI 47.9 ?Michelle Mcmillan is currently in the action stage of change. As such, her goal is to continue with weight loss efforts. She has agreed to the Category 3 Plan.  ? ?Exercise goals: As is. ? ?Behavioral modification strategies: increasing lean protein intake, decreasing simple carbohydrates, meal planning and cooking strategies, keeping healthy foods in the home, and planning for success. ? ?Michelle Mcmillan has agreed to  follow-up with our clinic in 3 weeks. She was informed of the importance of frequent follow-up visits to maximize her success with intensive lifestyle modifications for her multiple health conditions.  ? ?Objective:  ? ?Blood pressure (!) 141/89, pulse 62, temperature 98.1 ?F (36.7 ?C), height '5\' 9"'$  (1.753 m), weight (!) 324 lb (147 kg), SpO2 99 %. ?Body mass index is 47.85 kg/m?. ? ?General: Cooperative, alert, well developed, in no acute distress. ?HEENT: Conjunctivae and lids unremarkable. ?Cardiovascular: Regular rhythm.  ?Lungs: Normal work of breathing. ?Neurologic: No focal deficits.  ? ?Lab Results  ?Component Value Date  ? CREATININE 1.07 (H) 06/20/2021  ? BUN 16 06/20/2021  ? NA 143 06/20/2021  ? K 4.1 06/20/2021  ? CL 103 06/20/2021  ? CO2 27 06/20/2021  ? ?Lab Results  ?Component Value Date  ? ALT 13 06/20/2021  ? AST 18 06/20/2021  ? ALKPHOS 111 06/20/2021  ? BILITOT <0.2 06/20/2021  ? ?Lab Results  ?Component Value Date  ? HGBA1C 6.0 (H) 06/20/2021  ? ?Lab Results  ?Component Value Date  ? INSULIN 11.8 06/20/2021  ? ?Lab Results  ?Component Value Date  ? TSH 2.020 06/20/2021  ? ?Lab Results  ?Component Value Date  ? CHOL 179 06/20/2021  ? HDL 65 06/20/2021  ? LDLCALC 105 (H) 06/20/2021  ? TRIG 47 06/20/2021  ? ?Lab Results  ?Component Value Date  ? VD25OH 45.2 06/20/2021  ? ?Lab Results  ?Component Value Date  ? WBC 7.9 06/02/2018  ? HGB 9.5 (L) 06/02/2018  ? HCT 36.3 06/20/2021  ? MCV  77.6 (L) 06/02/2018  ? PLT 216 06/02/2018  ? ?Lab Results  ?Component Value Date  ? IRON 47 06/20/2021  ? TIBC 222 (L) 06/20/2021  ? FERRITIN 366 (H) 06/20/2021  ? ? ?Attestation Statements:  ? ?Reviewed by clinician on day of visit: allergies, medications, problem list, medical history, surgical history, family history, social history, and previous encounter notes. ? ?I, Michelle Mcmillan, RMA, am acting as transcriptionist for Mina Marble, NP. ? ?I have reviewed the above documentation for accuracy and completeness, and I  agree with the above. -  Michelle Sing d. Nikola Blackston, NP-C ?

## 2021-08-06 ENCOUNTER — Ambulatory Visit (INDEPENDENT_AMBULATORY_CARE_PROVIDER_SITE_OTHER): Payer: Medicare Other | Admitting: Bariatrics

## 2021-08-06 ENCOUNTER — Ambulatory Visit: Payer: Medicare Other | Admitting: Physical Therapy

## 2021-08-06 ENCOUNTER — Encounter: Payer: Self-pay | Admitting: Oncology

## 2021-08-06 ENCOUNTER — Encounter (INDEPENDENT_AMBULATORY_CARE_PROVIDER_SITE_OTHER): Payer: Self-pay

## 2021-08-06 DIAGNOSIS — M25511 Pain in right shoulder: Secondary | ICD-10-CM | POA: Diagnosis not present

## 2021-08-06 DIAGNOSIS — M25611 Stiffness of right shoulder, not elsewhere classified: Secondary | ICD-10-CM

## 2021-08-06 DIAGNOSIS — G8929 Other chronic pain: Secondary | ICD-10-CM

## 2021-08-06 DIAGNOSIS — M6281 Muscle weakness (generalized): Secondary | ICD-10-CM

## 2021-08-06 NOTE — Therapy (Signed)
?OUTPATIENT PHYSICAL THERAPY TREATMENT NOTE ? ? ?Patient Name: Michelle Mcmillan ?MRN: [id number] ?DOB:1971/01/15, 51 y.o., female ?Today's Date: 08/08/2021 ? ?PCP: Donnie Coffin, MD ?REFERRING PROVIDER: Esaw Grandchild, NP ? ?END OF SESSION:  ? PT End of Session - 08/08/21 6387   ? ? Visit Number 2   ? Number of Visits 13   ? Date for PT Re-Evaluation 09/13/21   ? Authorization Type UHC Medicare/Medicaid, VL based on medical necessity   ? Progress Note Due on Visit 10   ? PT Start Time 5643   ? PT Stop Time 1850   ? PT Time Calculation (min) 52 min   ? Activity Tolerance Patient tolerated treatment well   ? Behavior During Therapy [facility] for tasks assessed/performed   ? ?  ?  ? ?  ? ? ?Past Medical History:  ?Diagnosis Date  ? Anemia   ? H/O  ? Anemia   ? Arthritis   ? RIGHT KNEE  ? Back pain   ? Breast cancer ([place]) 2017  ? right breast  ? Cancer ([place])   ? 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)  ? Carpal tunnel syndrome   ? Constipation   ? Depression   ? Dyspnea   ? GERD (gastroesophageal reflux disease)   ? NO MEDS  ? Headache   ? migraines  ? Hypertension   ? Joint pain   ? Last menstrual period (LMP) > 10 days ago 08/2015  ? Lower extremity edema   ? Migraines   ? Neuropathy   ? OSA (obstructive sleep apnea)   ? Personal history of radiation therapy   ? Prediabetes   ? SOB (shortness of breath)   ? ?Past Surgical History:  ?Procedure Laterality Date  ? BREAST BIOPSY Right 05/08/2017  ? invasive mamm. ca  ? BREAST EXCISIONAL BIOPSY Right 06/15/15  ? breast cancer  ? BREAST LUMPECTOMY WITH NEEDLE LOCALIZATION Right 06/15/2015  ? Procedure: BREAST LUMPECTOMY WITH NEEDLE LOCALIZATION;  Surgeon: Hubbard Robinson, MD;  Location: [facility];  Service: General;  Laterality: Right;  ? BREAST RECONSTRUCTION WITH PLACEMENT OF TISSUE EXPANDER AND FLEX HD (ACELLULAR HYDRATED DERMIS) Left 02/09/2018  ? Procedure: BREAST RECONSTRUCTION WITH PLACEMENT OF TISSUE EXPANDER AND FLEX HD (ACELLULAR HYDRATED DERMIS);  Surgeon: Wallace Going, DO;   Location: [facility];  Service: Plastics;  Laterality: Left;  ? BREAST SURGERY    ? [place]  ? x1  ? COLONOSCOPY WITH PROPOFOL N/A 01/23/2017  ? Procedure: COLONOSCOPY WITH PROPOFOL;  Surgeon: Lin Landsman, MD;  Location: [facility] ENDOSCOPY;  Service: Gastroenterology;  Laterality: N/A;  ? ESOPHAGOGASTRODUODENOSCOPY (EGD) WITH PROPOFOL N/A 01/23/2017  ? Procedure: ESOPHAGOGASTRODUODENOSCOPY (EGD) WITH PROPOFOL;  Surgeon: Lin Landsman, MD;  Location: [facility] ENDOSCOPY;  Service: Gastroenterology;  Laterality: N/A;  ? LATISSIMUS FLAP TO BREAST Right 06/01/2018  ? Procedure: RIGHT BREAST RECONSTRUCTION WITH LATISSIMUS MYOCUTANEOUS FLAP;  Surgeon: Wallace Going, DO;  Location: [place];  Service: Plastics;  Laterality: Right;  ? LIPOSUCTION WITH LIPOFILLING Bilateral 10/15/2018  ? Procedure: LIPOSUCTION TO BILATERAL BREASTS;  Surgeon: Wallace Going, DO;  Location: [place];  Service: Plastics;  Laterality: Bilateral;  ? LIPOSUCTION WITH LIPOFILLING Bilateral 04/22/2019  ? Procedure: fat grafting/liposuction and lipofilling bilateral breasts;  Surgeon: Wallace Going, DO;  Location: [place];  Service: Plastics;  Laterality: Bilateral;  ? MASS EXCISION N/A 05/20/2016  ? Procedure: EXCISION CHEST WALL MASS;  Surgeon: Florene Glen, MD;  Location: [facility];  Service: General;  Laterality: N/A;  ? RECONSTRUCTION BREAST W/ LATISSIMUS DORSI FLAP Right 06/01/2018  ? REMOVAL OF BILATERAL TISSUE EXPANDERS WITH PLACEMENT OF BILATERAL BREAST IMPLANTS Bilateral 10/15/2018  ? Procedure: REMOVAL OF BILATERAL TISSUE EXPANDERS WITH PLACEMENT OF BILATERAL BREAST IMPLANTS;  Surgeon: Wallace Going, DO;  Location: [place];  Service: Plastics;  Laterality: Bilateral;  ? SCAR REVISION Bilateral 04/22/2019  ? Procedure: Excision of bilateral scar contracture;  Surgeon: Wallace Going, DO;  Location: [place];  Service: Plastics;   Laterality: Bilateral;  total case 90 min  ? SENTINEL NODE BIOPSY Right 06/15/2015  ? Procedure: SENTINEL NODE BIOPSY;  Surgeon: Hubbard Robinson, MD;  Location: [facility];  Service: General;  Laterality: Right;  ? TOTAL MASTECTOMY Right 05/22/2017  ? Procedure: TOTAL MASTECTOMY;  Surgeon: Florene Glen, MD;  Location: [facility];  Service: General;  Laterality: Right;  ? TOTAL MASTECTOMY Left 02/09/2018  ? Procedure: LEFT PROPHYLACTIC  MASTECTOMY;  Surgeon: Jovita Kussmaul, MD;  Location: [facility];  Service: General;  Laterality: Left;  ? TUBAL LIGATION  2004  ? ULNAR NERVE TRANSPOSITION Left 01/29/2019  ? Procedure: LEFT ULNAR NERVE DECOMPRESSION/TRANSPOSITION;  Surgeon: Leanora Cover, MD;  Location: [place];  Service: Orthopedics;  Laterality: Left;  ? ?Patient Active Problem List  ? Diagnosis Date Noted  ? Limited joint range of motion (ROM) 07/19/2021  ? Class 3 severe obesity due to excess calories with body mass index (BMI) of 45.0 to 49.9 in adult [facility]) 06/25/2021  ? Prediabetes 06/21/2021  ? Elevated ferritin level 06/21/2021  ? OSA on CPAP 04/20/2021  ? Migraine headache without aura 07/21/2020  ? Ulnar neuropathy 08/28/2018  ? S/P breast reconstruction, bilateral 06/16/2018  ? Acquired absence of right breast 06/01/2018  ? S/P mastectomy, bilateral 02/17/2018  ? Acquired absence of left breast 02/09/2018  ? Acquired absence of breast and absent nipple, right 01/02/2018  ? Postoperative breast asymmetry 01/02/2018  ? Intractable nausea and vomiting 08/11/2017  ? Breast cancer ([place]) 05/22/2017  ? Morbid (severe) obesity due to excess calories ([place]) 04/15/2017  ? Microcytic anemia 04/15/2017  ? GERD (gastroesophageal reflux disease) 04/15/2017  ? Chest wall mass   ? Malignant neoplasm of upper-outer quadrant of right female breast ([place]) 05/25/2015  ? HTN (hypertension) 05/24/2015  ? ? ?REFERRING DIAG: M25.60 (ICD-10-CM) - Limited joint range of motion (ROM) ? ?THERAPY DIAG:  ?Chronic right  shoulder pain ? ?Stiffness of right shoulder, not elsewhere classified ? ?Muscle weakness (generalized) ? ?PERTINENT HISTORY: Patient is a 51 year old female with R shoulder mobility deficits and R hand weakness. Pt reports undergoing mastectomy on her R side in 2019 and undergoing lymphedema therapy. She reports difficulty with accessing overhead motion on her R arm since then. Patient reports having to re-position arm when reaching overhead - she . She reports difficulty with her hands "locking up." Patient reports this is worse in her L hand presently. Pt has Hx of L ulnar nerve transposition in 2020 and Hx of carpel tunnel syndrome on Med Hx. She reports frequently dropping items when holding onto pen or kitchen items/spices. Patient is R hand dominant. Pt is in remission for breast cancer - since her surgery/chemo in fall of 2019. Pt reports fall off her front steps 9 months ago. Patient reports no imaging at the time; mainly integumentary injuries/scrapes. Pt has intermittent episodes of dizziness - pt describes it as "woozy" feeling and blurry vision; pt does not describe room  spinning sensation. No unexplained weight loss. Pt reports intermittent disturbed sleep due to R hip pain ? ?PRECAUTIONS: None ? ?SUBJECTIVE: Patient reports pain along lateral R upper arm and describes shooting that radiates down to R wrist. She denies paresthesias. Patient reports compliance with initial HEP and good tolerance of initial exercises performed.  ? ?PAIN:  ?Are you having pain? Yes: NPRS scale: 3/10 ?Pain location: R shoulder joint per patient ? ? ?TREATMENT TODAY ? ?Manual Therapy - for symptom modulation, soft tissue sensitivity and mobility, joint mobility, ROM  ?   ?  (In supine) ?STM R middle deltoid, R pectoralis major ?Pin and stretch technique with pressure onto outer pec major ?Glenohumeral joint mobilization gr II for pain control, posterior and inferior glides ? ?R Scapular mobilization with focus on  protraction and upward rotation in L sidelying ? ?Gentle R shoulder PROM witihin patient tolerance to check ROM ? ? ? ?Therapeutic Exercise - for improved soft tissue flexibility and extensibility as needed for ROM

## 2021-08-08 ENCOUNTER — Encounter (INDEPENDENT_AMBULATORY_CARE_PROVIDER_SITE_OTHER): Payer: Self-pay | Admitting: Family Medicine

## 2021-08-08 ENCOUNTER — Ambulatory Visit: Payer: Medicare Other | Admitting: Physical Therapy

## 2021-08-08 ENCOUNTER — Encounter: Payer: Self-pay | Admitting: Physical Therapy

## 2021-08-08 ENCOUNTER — Ambulatory Visit (INDEPENDENT_AMBULATORY_CARE_PROVIDER_SITE_OTHER): Payer: Medicare Other | Admitting: Family Medicine

## 2021-08-08 VITALS — BP 145/85 | Temp 98.0°F | Ht 69.0 in | Wt 320.0 lb

## 2021-08-08 DIAGNOSIS — M25611 Stiffness of right shoulder, not elsewhere classified: Secondary | ICD-10-CM

## 2021-08-08 DIAGNOSIS — R7989 Other specified abnormal findings of blood chemistry: Secondary | ICD-10-CM

## 2021-08-08 DIAGNOSIS — M25511 Pain in right shoulder: Secondary | ICD-10-CM | POA: Diagnosis not present

## 2021-08-08 DIAGNOSIS — E669 Obesity, unspecified: Secondary | ICD-10-CM | POA: Diagnosis not present

## 2021-08-08 DIAGNOSIS — G8929 Other chronic pain: Secondary | ICD-10-CM

## 2021-08-08 DIAGNOSIS — I10 Essential (primary) hypertension: Secondary | ICD-10-CM | POA: Insufficient documentation

## 2021-08-08 DIAGNOSIS — Z6841 Body Mass Index (BMI) 40.0 and over, adult: Secondary | ICD-10-CM | POA: Diagnosis not present

## 2021-08-08 DIAGNOSIS — M6281 Muscle weakness (generalized): Secondary | ICD-10-CM

## 2021-08-08 NOTE — Therapy (Signed)
?OUTPATIENT PHYSICAL THERAPY TREATMENT NOTE ? ? ?Patient Name: Michelle Mcmillan ?MRN: [id number] ?DOB:[birth year], 51 y.o., female ?Today's Date: 08/08/2021 ? ?PCP: Donnie Coffin, MD ?REFERRING PROVIDER: Esaw Grandchild, NP ? ?END OF SESSION:  ? PT End of Session - 08/08/21 1923   ? ? Visit Number 3   ? Number of Visits 13   ? Date for PT Re-Evaluation 09/13/21   ? Authorization Type UHC Medicare/Medicaid, VL based on medical necessity   ? Progress Note Due on Visit 10   ? PT Start Time 1501   ? PT Stop Time 1856   ? PT Time Calculation (min) 44 min   ? Activity Tolerance Patient tolerated treatment well   ? Behavior During Therapy [facility] for tasks assessed/performed   ? ?  ?  ? ?  ? ? ? ?Past Medical History:  ?Diagnosis Date  ? Anemia   ? H/O  ? Anemia   ? Arthritis   ? RIGHT KNEE  ? Back pain   ? Breast cancer ([place]) 2017  ? right breast  ? Cancer ([place])   ? 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)  ? Carpal tunnel syndrome   ? Constipation   ? Depression   ? Dyspnea   ? GERD (gastroesophageal reflux disease)   ? NO MEDS  ? Headache   ? migraines  ? Hypertension   ? Joint pain   ? Last menstrual period (LMP) > 10 days ago 08/2015  ? Lower extremity edema   ? Migraines   ? Neuropathy   ? OSA (obstructive sleep apnea)   ? Personal history of radiation therapy   ? Prediabetes   ? SOB (shortness of breath)   ? ?Past Surgical History:  ?Procedure Laterality Date  ? BREAST BIOPSY Right 05/08/2017  ? invasive mamm. ca  ? BREAST EXCISIONAL BIOPSY Right 06/15/15  ? breast cancer  ? BREAST LUMPECTOMY WITH NEEDLE LOCALIZATION Right 06/15/2015  ? Procedure: BREAST LUMPECTOMY WITH NEEDLE LOCALIZATION;  Surgeon: Hubbard Robinson, MD;  Location: [facility];  Service: General;  Laterality: Right;  ? BREAST RECONSTRUCTION WITH PLACEMENT OF TISSUE EXPANDER AND FLEX HD (ACELLULAR HYDRATED DERMIS) Left 02/09/2018  ? Procedure: BREAST RECONSTRUCTION WITH PLACEMENT OF TISSUE EXPANDER AND FLEX HD (ACELLULAR HYDRATED DERMIS);  Surgeon: Wallace Going, DO;   Location: [facility];  Service: Plastics;  Laterality: Left;  ? BREAST SURGERY    ? [place]  ? x1  ? COLONOSCOPY WITH PROPOFOL N/A 01/23/2017  ? Procedure: COLONOSCOPY WITH PROPOFOL;  Surgeon: Lin Landsman, MD;  Location: [facility] ENDOSCOPY;  Service: Gastroenterology;  Laterality: N/A;  ? ESOPHAGOGASTRODUODENOSCOPY (EGD) WITH PROPOFOL N/A 01/23/2017  ? Procedure: ESOPHAGOGASTRODUODENOSCOPY (EGD) WITH PROPOFOL;  Surgeon: Lin Landsman, MD;  Location: [facility] ENDOSCOPY;  Service: Gastroenterology;  Laterality: N/A;  ? LATISSIMUS FLAP TO BREAST Right 06/01/2018  ? Procedure: RIGHT BREAST RECONSTRUCTION WITH LATISSIMUS MYOCUTANEOUS FLAP;  Surgeon: Wallace Going, DO;  Location: [place];  Service: Plastics;  Laterality: Right;  ? LIPOSUCTION WITH LIPOFILLING Bilateral 10/15/2018  ? Procedure: LIPOSUCTION TO BILATERAL BREASTS;  Surgeon: Wallace Going, DO;  Location: [place];  Service: Plastics;  Laterality: Bilateral;  ? LIPOSUCTION WITH LIPOFILLING Bilateral 04/22/2019  ? Procedure: fat grafting/liposuction and lipofilling bilateral breasts;  Surgeon: Wallace Going, DO;  Location: [place];  Service: Plastics;  Laterality: Bilateral;  ? MASS EXCISION N/A 05/20/2016  ? Procedure: EXCISION CHEST WALL MASS;  Surgeon: Florene Glen, MD;  Location: [facility]  ORS;  Service: General;  Laterality: N/A;  ? RECONSTRUCTION BREAST W/ LATISSIMUS DORSI FLAP Right 06/01/2018  ? REMOVAL OF BILATERAL TISSUE EXPANDERS WITH PLACEMENT OF BILATERAL BREAST IMPLANTS Bilateral 10/15/2018  ? Procedure: REMOVAL OF BILATERAL TISSUE EXPANDERS WITH PLACEMENT OF BILATERAL BREAST IMPLANTS;  Surgeon: Wallace Going, DO;  Location: [place];  Service: Plastics;  Laterality: Bilateral;  ? SCAR REVISION Bilateral 04/22/2019  ? Procedure: Excision of bilateral scar contracture;  Surgeon: Wallace Going, DO;  Location: [place];  Service: Plastics;   Laterality: Bilateral;  total case 90 min  ? SENTINEL NODE BIOPSY Right 06/15/2015  ? Procedure: SENTINEL NODE BIOPSY;  Surgeon: Hubbard Robinson, MD;  Location: [facility];  Service: General;  Laterality: Right;  ? TOTAL MASTECTOMY Right 05/22/2017  ? Procedure: TOTAL MASTECTOMY;  Surgeon: Florene Glen, MD;  Location: [facility];  Service: General;  Laterality: Right;  ? TOTAL MASTECTOMY Left 02/09/2018  ? Procedure: LEFT PROPHYLACTIC  MASTECTOMY;  Surgeon: Jovita Kussmaul, MD;  Location: [facility];  Service: General;  Laterality: Left;  ? TUBAL LIGATION  2004  ? ULNAR NERVE TRANSPOSITION Left 01/29/2019  ? Procedure: LEFT ULNAR NERVE DECOMPRESSION/TRANSPOSITION;  Surgeon: Leanora Cover, MD;  Location: [place];  Service: Orthopedics;  Laterality: Left;  ? ?Patient Active Problem List  ? Diagnosis Date Noted  ? Elevated blood pressure reading in office with white coat syndrome, with diagnosis of hypertension 08/08/2021  ? Limited joint range of motion (ROM) 07/19/2021  ? Class 3 severe obesity due to excess calories with body mass index (BMI) of 45.0 to 49.9 in adult [facility]) 06/25/2021  ? Prediabetes 06/21/2021  ? Elevated ferritin level 06/21/2021  ? OSA on CPAP 04/20/2021  ? Migraine headache without aura 07/21/2020  ? Ulnar neuropathy 08/28/2018  ? S/P breast reconstruction, bilateral 06/16/2018  ? Acquired absence of right breast 06/01/2018  ? S/P mastectomy, bilateral 02/17/2018  ? Acquired absence of left breast 02/09/2018  ? Acquired absence of breast and absent nipple, right 01/02/2018  ? Postoperative breast asymmetry 01/02/2018  ? Intractable nausea and vomiting 08/11/2017  ? Breast cancer ([place]) 05/22/2017  ? Morbid (severe) obesity due to excess calories ([place]) 04/15/2017  ? Microcytic anemia 04/15/2017  ? GERD (gastroesophageal reflux disease) 04/15/2017  ? Chest wall mass   ? Malignant neoplasm of upper-outer quadrant of right female breast ([place]) 05/25/2015  ? HTN (hypertension) 05/24/2015   ? ? ?REFERRING DIAG: M25.60 (ICD-10-CM) - Limited joint range of motion (ROM) ? ?THERAPY DIAG:  ?Chronic right shoulder pain ? ?Stiffness of right shoulder, not elsewhere classified ? ?Muscle weakness (generalized) ? ?PERTINENT HISTORY: Patient is a 51 year old female with R shoulder mobility deficits and R hand weakness. Pt reports undergoing mastectomy on her R side in 2019 and undergoing lymphedema therapy. She reports difficulty with accessing overhead motion on her R arm since then. Patient reports having to re-position arm when reaching overhead - she . She reports difficulty with her hands "locking up." Patient reports this is worse in her L hand presently. Pt has Hx of L ulnar nerve transposition in 2020 and Hx of carpel tunnel syndrome on Med Hx. She reports frequently dropping items when holding onto pen or kitchen items/spices. Patient is R hand dominant. Pt is in remission for breast cancer - since her surgery/chemo in fall of 2019. Pt reports fall off her front steps 9 months ago. Patient reports no imaging at the time; mainly integumentary injuries/scrapes. Pt has  intermittent episodes of dizziness - pt describes it as "woozy" feeling and blurry vision; pt does not describe room spinning sensation. No unexplained weight loss. Pt reports intermittent disturbed sleep due to R hip pain ? ?PRECAUTIONS: None ? ?SUBJECTIVE: Continues to report tenderness to lateral upper right arm. Pt has a bone density test scheduled in June. Shooting pain into right wrist has eased up. Is compliant with HEP; no questions or concerns at this time.  ? ?PAIN:  ?Are you having pain? Yes: NPRS scale: 2/10 ?Pain location: R shoulder joint per patient ? ? ?TREATMENT TODAY ? ?Manual Therapy - for symptom modulation, soft tissue sensitivity and mobility, joint mobility, ROM  ?   ?  (In supine) ?STM and TP release to R middle deltoid, anterior deltoid, R pectoralis major ?Desensitization to R middle and anterior deltoid utilizing  graded pressure, light to moderate.  ?Pin and stretch technique with pressure onto outer pec major ?Glenohumeral joint mobilization gr II for pain control, posterior and inferior glides ?R shoulder PROM

## 2021-08-13 ENCOUNTER — Ambulatory Visit: Payer: Medicare Other

## 2021-08-13 DIAGNOSIS — M25511 Pain in right shoulder: Secondary | ICD-10-CM | POA: Diagnosis not present

## 2021-08-13 DIAGNOSIS — G8929 Other chronic pain: Secondary | ICD-10-CM

## 2021-08-13 DIAGNOSIS — M25611 Stiffness of right shoulder, not elsewhere classified: Secondary | ICD-10-CM

## 2021-08-13 DIAGNOSIS — M6281 Muscle weakness (generalized): Secondary | ICD-10-CM

## 2021-08-13 NOTE — Therapy (Signed)
?OUTPATIENT PHYSICAL THERAPY TREATMENT NOTE ? ? ?Patient Name: Michelle Mcmillan ?MRN: [id number] ?DOB:1970-09-29, 51 y.o., female ?Today's Date: 08/13/2021 ? ?PCP: Donnie Coffin, MD ?REFERRING PROVIDER: Esaw Grandchild, NP ? ?END OF SESSION:  ? PT End of Session - 08/13/21 3244   ? ? Visit Number 4   ? Number of Visits 13   ? Date for PT Re-Evaluation 09/13/21   ? Authorization Type UHC Medicare/Medicaid, VL based on medical necessity   ? Progress Note Due on Visit 10   ? PT Start Time [phone]   ? PT Stop Time 0930   ? PT Time Calculation (min) 35 min   ? Activity Tolerance Patient tolerated treatment well   ? Behavior During Therapy [facility] for tasks assessed/performed   ? ?  ?  ? ?  ? ? ?Past Medical History:  ?Diagnosis Date  ? Anemia   ? H/O  ? Anemia   ? Arthritis   ? RIGHT KNEE  ? Back pain   ? Breast cancer ([place]) 2017  ? right breast  ? Cancer ([place])   ? 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)  ? Carpal tunnel syndrome   ? Constipation   ? Depression   ? Dyspnea   ? GERD (gastroesophageal reflux disease)   ? NO MEDS  ? Headache   ? migraines  ? Hypertension   ? Joint pain   ? Last menstrual period (LMP) > 10 days ago 08/2015  ? Lower extremity edema   ? Migraines   ? Neuropathy   ? OSA (obstructive sleep apnea)   ? Personal history of radiation therapy   ? Prediabetes   ? SOB (shortness of breath)   ? ?Past Surgical History:  ?Procedure Laterality Date  ? BREAST BIOPSY Right 05/08/2017  ? invasive mamm. ca  ? BREAST EXCISIONAL BIOPSY Right 06/15/15  ? breast cancer  ? BREAST LUMPECTOMY WITH NEEDLE LOCALIZATION Right 06/15/2015  ? Procedure: BREAST LUMPECTOMY WITH NEEDLE LOCALIZATION;  Surgeon: Hubbard Robinson, MD;  Location: [facility];  Service: General;  Laterality: Right;  ? BREAST RECONSTRUCTION WITH PLACEMENT OF TISSUE EXPANDER AND FLEX HD (ACELLULAR HYDRATED DERMIS) Left 02/09/2018  ? Procedure: BREAST RECONSTRUCTION WITH PLACEMENT OF TISSUE EXPANDER AND FLEX HD (ACELLULAR HYDRATED DERMIS);  Surgeon: Wallace Going, DO;   Location: [facility];  Service: Plastics;  Laterality: Left;  ? BREAST SURGERY    ? [place]  ? x1  ? COLONOSCOPY WITH PROPOFOL N/A 01/23/2017  ? Procedure: COLONOSCOPY WITH PROPOFOL;  Surgeon: Lin Landsman, MD;  Location: [facility] ENDOSCOPY;  Service: Gastroenterology;  Laterality: N/A;  ? ESOPHAGOGASTRODUODENOSCOPY (EGD) WITH PROPOFOL N/A 01/23/2017  ? Procedure: ESOPHAGOGASTRODUODENOSCOPY (EGD) WITH PROPOFOL;  Surgeon: Lin Landsman, MD;  Location: [facility] ENDOSCOPY;  Service: Gastroenterology;  Laterality: N/A;  ? LATISSIMUS FLAP TO BREAST Right 06/01/2018  ? Procedure: RIGHT BREAST RECONSTRUCTION WITH LATISSIMUS MYOCUTANEOUS FLAP;  Surgeon: Wallace Going, DO;  Location: [place];  Service: Plastics;  Laterality: Right;  ? LIPOSUCTION WITH LIPOFILLING Bilateral 10/15/2018  ? Procedure: LIPOSUCTION TO BILATERAL BREASTS;  Surgeon: Wallace Going, DO;  Location: [place];  Service: Plastics;  Laterality: Bilateral;  ? LIPOSUCTION WITH LIPOFILLING Bilateral 04/22/2019  ? Procedure: fat grafting/liposuction and lipofilling bilateral breasts;  Surgeon: Wallace Going, DO;  Location: [place];  Service: Plastics;  Laterality: Bilateral;  ? MASS EXCISION N/A 05/20/2016  ? Procedure: EXCISION CHEST WALL MASS;  Surgeon: Florene Glen, MD;  Location: [facility];  Service: General;  Laterality: N/A;  ? RECONSTRUCTION BREAST W/ LATISSIMUS DORSI FLAP Right 06/01/2018  ? REMOVAL OF BILATERAL TISSUE EXPANDERS WITH PLACEMENT OF BILATERAL BREAST IMPLANTS Bilateral 10/15/2018  ? Procedure: REMOVAL OF BILATERAL TISSUE EXPANDERS WITH PLACEMENT OF BILATERAL BREAST IMPLANTS;  Surgeon: Wallace Going, DO;  Location: [place];  Service: Plastics;  Laterality: Bilateral;  ? SCAR REVISION Bilateral 04/22/2019  ? Procedure: Excision of bilateral scar contracture;  Surgeon: Wallace Going, DO;  Location: [place];  Service: Plastics;   Laterality: Bilateral;  total case 90 min  ? SENTINEL NODE BIOPSY Right 06/15/2015  ? Procedure: SENTINEL NODE BIOPSY;  Surgeon: Hubbard Robinson, MD;  Location: [facility];  Service: General;  Laterality: Right;  ? TOTAL MASTECTOMY Right 05/22/2017  ? Procedure: TOTAL MASTECTOMY;  Surgeon: Florene Glen, MD;  Location: [facility];  Service: General;  Laterality: Right;  ? TOTAL MASTECTOMY Left 02/09/2018  ? Procedure: LEFT PROPHYLACTIC  MASTECTOMY;  Surgeon: Jovita Kussmaul, MD;  Location: [facility];  Service: General;  Laterality: Left;  ? TUBAL LIGATION  2004  ? ULNAR NERVE TRANSPOSITION Left 01/29/2019  ? Procedure: LEFT ULNAR NERVE DECOMPRESSION/TRANSPOSITION;  Surgeon: Leanora Cover, MD;  Location: [place];  Service: Orthopedics;  Laterality: Left;  ? ?Patient Active Problem List  ? Diagnosis Date Noted  ? Elevated blood pressure reading in office with white coat syndrome, with diagnosis of hypertension 08/08/2021  ? Limited joint range of motion (ROM) 07/19/2021  ? Class 3 severe obesity due to excess calories with body mass index (BMI) of 45.0 to 49.9 in adult [facility]) 06/25/2021  ? Prediabetes 06/21/2021  ? Elevated ferritin level 06/21/2021  ? OSA on CPAP 04/20/2021  ? Migraine headache without aura 07/21/2020  ? Ulnar neuropathy 08/28/2018  ? S/P breast reconstruction, bilateral 06/16/2018  ? Acquired absence of right breast 06/01/2018  ? S/P mastectomy, bilateral 02/17/2018  ? Acquired absence of left breast 02/09/2018  ? Acquired absence of breast and absent nipple, right 01/02/2018  ? Postoperative breast asymmetry 01/02/2018  ? Intractable nausea and vomiting 08/11/2017  ? Breast cancer ([place]) 05/22/2017  ? Morbid (severe) obesity due to excess calories ([place]) 04/15/2017  ? Microcytic anemia 04/15/2017  ? GERD (gastroesophageal reflux disease) 04/15/2017  ? Chest wall mass   ? Malignant neoplasm of upper-outer quadrant of right female breast ([place]) 05/25/2015  ? HTN (hypertension) 05/24/2015   ? ? ?REFERRING DIAG: M25.60 (ICD-10-CM) - Limited joint range of motion (ROM) ? ?THERAPY DIAG:  ?Chronic right shoulder pain ? ?Stiffness of right shoulder, not elsewhere classified ? ?Muscle weakness (generalized) ? ?PERTINENT HISTORY: Patient is a 51 year old female with R shoulder mobility deficits and R hand weakness. Pt reports undergoing mastectomy on her R side in 2019 and undergoing lymphedema therapy. She reports difficulty with accessing overhead motion on her R arm since then. Patient reports having to re-position arm when reaching overhead - she . She reports difficulty with her hands "locking up." Patient reports this is worse in her L hand presently. Pt has Hx of L ulnar nerve transposition in 2020 and Hx of carpel tunnel syndrome on Med Hx. She reports frequently dropping items when holding onto pen or kitchen items/spices. Patient is R hand dominant. Pt is in remission for breast cancer - since her surgery/chemo in fall of 2019. Pt reports fall off her front steps 9 months ago. Patient reports no imaging at the time; mainly integumentary injuries/scrapes. Pt has intermittent episodes  of dizziness - pt describes it as "woozy" feeling and blurry vision; pt does not describe room spinning sensation. No unexplained weight loss. Pt reports intermittent disturbed sleep due to R hip pain ? ?PRECAUTIONS: None ? ?OBJECTIVE: (objective measures completed at initial evaluation unless otherwise dated) ? ?  ?DIAGNOSTIC FINDINGS:  ?None for R shoulder or R upper limb recently  ?  ?PATIENT SURVEYS:  ?FOTO 62 ?  ?COGNITION: ?          Overall cognitive status: Within functional limits for tasks assessed ?                                 ?SENSATION: ?WFL ?  ?POSTURE: ?Rounded shoulders, anteriorly tilted scapulae, increased thoracic kyphosis ?  ?UPPER EXTREMITY ROM:  ?  ?Active ROM Right ?08/01/2021 Left ?08/01/2021  ?Shoulder flexion 113* 130  ?Shoulder extension      ?Shoulder abduction 84* 149  ?Shoulder adduction       ?Shoulder internal rotation      ?Shoulder external rotation 78 83  ?Elbow flexion WNL WNL  ?Elbow extension -8 -4  ?Wrist flexion WNL WNL  ?Wrist extension WNL WNL  ?Wrist ulnar deviation WNL WNL  ?W

## 2021-08-15 ENCOUNTER — Ambulatory Visit: Payer: Medicare Other | Admitting: Physical Therapy

## 2021-08-15 ENCOUNTER — Encounter: Payer: Self-pay | Admitting: Physical Therapy

## 2021-08-15 DIAGNOSIS — M25611 Stiffness of right shoulder, not elsewhere classified: Secondary | ICD-10-CM

## 2021-08-15 DIAGNOSIS — G8929 Other chronic pain: Secondary | ICD-10-CM

## 2021-08-15 DIAGNOSIS — M6281 Muscle weakness (generalized): Secondary | ICD-10-CM

## 2021-08-15 DIAGNOSIS — M25511 Pain in right shoulder: Secondary | ICD-10-CM | POA: Diagnosis not present

## 2021-08-15 NOTE — Therapy (Signed)
?OUTPATIENT PHYSICAL THERAPY TREATMENT NOTE ? ? ?Patient Name: Michelle Mcmillan ?MRN: [id number] ?DOB:15-Feb-1971, 51 y.o., female ?Today's Date: 08/15/2021 ? ?PCP: Donnie Coffin, MD ?REFERRING PROVIDER: Esaw Grandchild, NP ? ?END OF SESSION:  ? PT End of Session - 08/15/21 0946   ? ? Visit Number 5   ? Number of Visits 13   ? Date for PT Re-Evaluation 09/13/21   ? Authorization Type UHC Medicare/Medicaid, [place] based on medical necessity   ? Progress Note Due on Visit 10   ? PT Start Time [phone]   ? PT Stop Time 0930   ? PT Time Calculation (min) 40 min   ? Activity Tolerance Patient tolerated treatment well   ? Behavior During Therapy [facility] for tasks assessed/performed   ? ?  ?  ? ?  ? ? ? ?Past Medical History:  ?Diagnosis Date  ? Anemia   ? H/O  ? Anemia   ? Arthritis   ? RIGHT KNEE  ? Back pain   ? Breast cancer ([place]) 2017  ? right breast  ? Cancer ([place])   ? 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)  ? Carpal tunnel syndrome   ? Constipation   ? Depression   ? Dyspnea   ? GERD (gastroesophageal reflux disease)   ? NO MEDS  ? Headache   ? migraines  ? Hypertension   ? Joint pain   ? Last menstrual period (LMP) > 10 days ago 08/2015  ? Lower extremity edema   ? Migraines   ? Neuropathy   ? OSA (obstructive sleep apnea)   ? Personal history of radiation therapy   ? Prediabetes   ? SOB (shortness of breath)   ? ?Past Surgical History:  ?Procedure Laterality Date  ? BREAST BIOPSY Right 05/08/2017  ? invasive mamm. ca  ? BREAST EXCISIONAL BIOPSY Right 06/15/15  ? breast cancer  ? BREAST LUMPECTOMY WITH NEEDLE LOCALIZATION Right 06/15/2015  ? Procedure: BREAST LUMPECTOMY WITH NEEDLE LOCALIZATION;  Surgeon: Hubbard Robinson, MD;  Location: [facility] ORS;  Service: General;  Laterality: Right;  ? BREAST RECONSTRUCTION WITH PLACEMENT OF TISSUE EXPANDER AND FLEX HD (ACELLULAR HYDRATED DERMIS) Left 02/09/2018  ? Procedure: BREAST RECONSTRUCTION WITH PLACEMENT OF TISSUE EXPANDER AND FLEX HD (ACELLULAR HYDRATED DERMIS);  Surgeon: Wallace Going, DO;   Location: [facility];  Service: Plastics;  Laterality: Left;  ? BREAST SURGERY    ? [place]  ? x1  ? COLONOSCOPY WITH PROPOFOL N/A 01/23/2017  ? Procedure: COLONOSCOPY WITH PROPOFOL;  Surgeon: Lin Landsman, MD;  Location: [facility] ENDOSCOPY;  Service: Gastroenterology;  Laterality: N/A;  ? ESOPHAGOGASTRODUODENOSCOPY (EGD) WITH PROPOFOL N/A 01/23/2017  ? Procedure: ESOPHAGOGASTRODUODENOSCOPY (EGD) WITH PROPOFOL;  Surgeon: Lin Landsman, MD;  Location: [facility] ENDOSCOPY;  Service: Gastroenterology;  Laterality: N/A;  ? LATISSIMUS FLAP TO BREAST Right 06/01/2018  ? Procedure: RIGHT BREAST RECONSTRUCTION WITH LATISSIMUS MYOCUTANEOUS FLAP;  Surgeon: Wallace Going, DO;  Location: [place];  Service: Plastics;  Laterality: Right;  ? LIPOSUCTION WITH LIPOFILLING Bilateral 10/15/2018  ? Procedure: LIPOSUCTION TO BILATERAL BREASTS;  Surgeon: Wallace Going, DO;  Location: [place];  Service: Plastics;  Laterality: Bilateral;  ? LIPOSUCTION WITH LIPOFILLING Bilateral 04/22/2019  ? Procedure: fat grafting/liposuction and lipofilling bilateral breasts;  Surgeon: Wallace Going, DO;  Location: [place];  Service: Plastics;  Laterality: Bilateral;  ? MASS EXCISION N/A 05/20/2016  ? Procedure: EXCISION CHEST WALL MASS;  Surgeon: Florene Glen, MD;  Location: [facility]  ORS;  Service: General;  Laterality: N/A;  ? RECONSTRUCTION BREAST W/ LATISSIMUS DORSI FLAP Right 06/01/2018  ? REMOVAL OF BILATERAL TISSUE EXPANDERS WITH PLACEMENT OF BILATERAL BREAST IMPLANTS Bilateral 10/15/2018  ? Procedure: REMOVAL OF BILATERAL TISSUE EXPANDERS WITH PLACEMENT OF BILATERAL BREAST IMPLANTS;  Surgeon: Wallace Going, DO;  Location: [place];  Service: Plastics;  Laterality: Bilateral;  ? SCAR REVISION Bilateral 04/22/2019  ? Procedure: Excision of bilateral scar contracture;  Surgeon: Wallace Going, DO;  Location: [place];  Service: Plastics;   Laterality: Bilateral;  total case 90 min  ? SENTINEL NODE BIOPSY Right 06/15/2015  ? Procedure: SENTINEL NODE BIOPSY;  Surgeon: Hubbard Robinson, MD;  Location: [facility] ORS;  Service: General;  Laterality: Right;  ? TOTAL MASTECTOMY Right 05/22/2017  ? Procedure: TOTAL MASTECTOMY;  Surgeon: Florene Glen, MD;  Location: [facility] ORS;  Service: General;  Laterality: Right;  ? TOTAL MASTECTOMY Left 02/09/2018  ? Procedure: LEFT PROPHYLACTIC  MASTECTOMY;  Surgeon: Jovita Kussmaul, MD;  Location: [facility];  Service: General;  Laterality: Left;  ? TUBAL LIGATION  2004  ? ULNAR NERVE TRANSPOSITION Left 01/29/2019  ? Procedure: LEFT ULNAR NERVE DECOMPRESSION/TRANSPOSITION;  Surgeon: Leanora Cover, MD;  Location: [place];  Service: Orthopedics;  Laterality: Left;  ? ?Patient Active Problem List  ? Diagnosis Date Noted  ? Elevated blood pressure reading in office with white coat syndrome, with diagnosis of hypertension 08/08/2021  ? Limited joint range of motion (ROM) 07/19/2021  ? Class 3 severe obesity due to excess calories with body mass index (BMI) of 45.0 to 49.9 in adult [facility]) 06/25/2021  ? Prediabetes 06/21/2021  ? Elevated ferritin level 06/21/2021  ? OSA on CPAP 04/20/2021  ? Migraine headache without aura 07/21/2020  ? Ulnar neuropathy 08/28/2018  ? S/P breast reconstruction, bilateral 06/16/2018  ? Acquired absence of right breast 06/01/2018  ? S/P mastectomy, bilateral 02/17/2018  ? Acquired absence of left breast 02/09/2018  ? Acquired absence of breast and absent nipple, right 01/02/2018  ? Postoperative breast asymmetry 01/02/2018  ? Intractable nausea and vomiting 08/11/2017  ? Breast cancer ([place]) 05/22/2017  ? Morbid (severe) obesity due to excess calories ([place]) 04/15/2017  ? Microcytic anemia 04/15/2017  ? GERD (gastroesophageal reflux disease) 04/15/2017  ? Chest wall mass   ? Malignant neoplasm of upper-outer quadrant of right female breast ([place]) 05/25/2015  ? HTN (hypertension) 05/24/2015   ? ? ?REFERRING DIAG: M25.60 (ICD-10-CM) - Limited joint range of motion (ROM) ? ?THERAPY DIAG:  ?Chronic right shoulder pain ? ?Stiffness of right shoulder, not elsewhere classified ? ?Muscle weakness (generalized) ? ?PERTINENT HISTORY: Patient is a 51 year old female with R shoulder mobility deficits and R hand weakness. Pt reports undergoing mastectomy on her R side in 2019 and undergoing lymphedema therapy. She reports difficulty with accessing overhead motion on her R arm since then. Patient reports having to re-position arm when reaching overhead - she . She reports difficulty with her hands "locking up." Patient reports this is worse in her L hand presently. Pt has Hx of L ulnar nerve transposition in 2020 and Hx of carpel tunnel syndrome on Med Hx. She reports frequently dropping items when holding onto pen or kitchen items/spices. Patient is R hand dominant. Pt is in remission for breast cancer - since her surgery/chemo in fall of 2019. Pt reports fall off her front steps 9 months ago. Patient reports no imaging at the time; mainly integumentary injuries/scrapes. Pt has  intermittent episodes of dizziness - pt describes it as "woozy" feeling and blurry vision; pt does not describe room spinning sensation. No unexplained weight loss. Pt reports intermittent disturbed sleep due to R hip pain ? ?PRECAUTIONS: None ? ?OBJECTIVE: (objective measures completed at initial evaluation unless otherwise dated) ? ?  ?DIAGNOSTIC FINDINGS:  ?None for R shoulder or R upper limb recently  ?  ?PATIENT SURVEYS:  ?FOTO 32 ?  ?COGNITION: ?          Overall cognitive status: Within functional limits for tasks assessed ?                                 ?SENSATION: ?WFL ?  ?POSTURE: ?Rounded shoulders, anteriorly tilted scapulae, increased thoracic kyphosis ?  ?UPPER EXTREMITY ROM:  ?  ?Active ROM Right ?08/01/2021 Left ?08/01/2021  ?Shoulder flexion 113* 130  ?Shoulder extension      ?Shoulder abduction 84* 149  ?Shoulder adduction       ?Shoulder internal rotation      ?Shoulder external rotation 78 83  ?Elbow flexion WNL WNL  ?Elbow extension -8 -4  ?Wrist flexion WNL WNL  ?Wrist extension WNL WNL  ?Wrist ulnar deviation WNL WNL

## 2021-08-17 NOTE — Progress Notes (Signed)
Chief Complaint:   OBESITY Michelle Mcmillan is here to discuss her progress with her obesity treatment plan along with follow-up of her obesity related diagnoses. Michelle Mcmillan is on the Category 3 Plan and states she is following her eating plan approximately 90 to 95% of the time. Michelle Mcmillan states she has not been exercising.  Today's visit was #: 4 Starting weight: 333 lbs Starting date: 06/20/2021 Today's weight: 320 lbs Today's date: 08/08/2021 Total lbs lost to date: 13 Total lbs lost since last in-office visit: 4  Interim History: Michelle Mcmillan is here for a follow up office visit. We reviewed her meal plan and all questions were answered. Patient's food recall appears to be accurate and consistent with what is on plan when she is following it. When eating on plan, her hunger and cravings are well controlled. Michelle Mcmillan is a patient of Dr Owens Shark and this is her 1st office visit with me.    Subjective:   1. Elevated blood pressure reading in office with white coat syndrome, with diagnosis of hypertension Michelle Mcmillan's blood pressure at home is 120/70's-80's. She checks her blood pressure every day. Michelle Mcmillan is asymptomatic and denies concerns. She is not on blood pressure medicine.  2.  Elevated ferritin level [place] checked with her hematology/oncology doctor and they didn't have a problem with her levels. She discontinued vitamin C, but otherwise, she has a follow up with them in June. We reviewed her Anemia panel.  Assessment/Plan:  No orders of the defined types were placed in this encounter.   There are no discontinued medications.   No orders of the defined types were placed in this encounter.    1. Elevated blood pressure reading in office with white coat syndrome, with diagnosis of hypertension Michelle Mcmillan agrees to continue checking her home blood pressure monitoring, following her PNP, decreasing salt intake, and weight loss.  2.  Elevated ferritin level I recommend follow up labs with  her hematology/oncology doctor as they feel are indicated.  3. Obesity, Current BMI 47.4 Michelle Mcmillan is currently in the action stage of change. As such, her goal is to continue with weight loss efforts. She has agreed to the Category 3 Plan with lunch options.   Exercise goals:  As is.  Behavioral modification strategies: increasing lean protein intake and planning for success.  Michelle Mcmillan has agreed to follow-up with our clinic in 2 weeks. She was informed of the importance of frequent follow-up visits to maximize her success with intensive lifestyle modifications for her multiple health conditions.   Objective:   Blood pressure (!) 145/85, temperature 98 F (36.7 C), height '5\' 9"'$  (1.753 m), weight (!) 320 lb (145.2 kg), SpO2 99 %. Body mass index is 47.26 kg/m.  General: Cooperative, alert, well developed, in no acute distress. HEENT: Conjunctivae and lids unremarkable. Cardiovascular: Regular rhythm.  Lungs: Normal work of breathing. Neurologic: No focal deficits.   Lab Results  Component Value Date   CREATININE 1.07 (H) 06/20/2021   BUN 16 06/20/2021   NA 143 06/20/2021   K 4.1 06/20/2021   CL 103 06/20/2021   CO2 27 06/20/2021   Lab Results  Component Value Date   ALT 13 06/20/2021   AST 18 06/20/2021   ALKPHOS 111 06/20/2021   BILITOT <0.2 06/20/2021   Lab Results  Component Value Date   HGBA1C 6.0 (H) 06/20/2021   Lab Results  Component Value Date   INSULIN 11.8 06/20/2021   Lab Results  Component Value Date   TSH 2.020  06/20/2021   Lab Results  Component Value Date   CHOL 179 06/20/2021   HDL 65 06/20/2021   LDLCALC 105 (H) 06/20/2021   TRIG 47 06/20/2021   Lab Results  Component Value Date   VD25OH 45.2 06/20/2021   Lab Results  Component Value Date   WBC 7.9 06/02/2018   HGB 9.5 (L) 06/02/2018   HCT 36.3 06/20/2021   MCV 77.6 (L) 06/02/2018   PLT 216 06/02/2018   Lab Results  Component Value Date   IRON 47 06/20/2021   TIBC 222 (L)  06/20/2021   FERRITIN 366 (H) 06/20/2021    Obesity Behavioral Intervention:   Approximately 15 minutes were spent on the discussion below.  ASK: We discussed the diagnosis of obesity with Michelle Mcmillan today and Michelle Mcmillan agreed to give [place] permission to discuss obesity behavioral modification therapy today.  ASSESS: Michelle Mcmillan has the diagnosis of obesity and her BMI today is 47.4. Michelle Mcmillan is in the action stage of change.   ADVISE: Michelle Mcmillan was educated on the multiple health risks of obesity as well as the benefit of weight loss to improve her health. She was advised of the need for long term treatment and the importance of lifestyle modifications to improve her current health and to decrease her risk of future health problems.  AGREE: Multiple dietary modification options and treatment options were discussed and Michelle Mcmillan agreed to follow the recommendations documented in the above note.  ARRANGE: Michelle Mcmillan was educated on the importance of frequent visits to treat obesity as outlined per CMS and USPSTF guidelines and agreed to schedule her next follow up appointment today.  Attestation Statements:   Reviewed by clinician on day of visit: allergies, medications, problem list, medical history, surgical history, family history, social history, and previous encounter notes.  Time spent on visit including pre-visit chart review and post-visit care and charting was 30 minutes.   IMarcille Blanco, CMA, am acting as transcriptionist for [employer], DO  I have reviewed the above documentation for accuracy and completeness, and I agree with the above. [name], D.O.  The [place] was signed into law in 2016 which includes the topic of electronic health records.  This provides immediate access to information in MyChart.  This includes consultation notes, operative notes, office notes, lab results and pathology reports.  If you have any questions about what you read please let  us know at your next visit so we can discuss your concerns and take corrective action if need be.  We are right here with you.

## 2021-08-20 ENCOUNTER — Encounter: Payer: Medicare Other | Admitting: Physical Therapy

## 2021-08-21 ENCOUNTER — Encounter (INDEPENDENT_AMBULATORY_CARE_PROVIDER_SITE_OTHER): Payer: Self-pay | Admitting: Adult Health

## 2021-08-21 ENCOUNTER — Ambulatory Visit (INDEPENDENT_AMBULATORY_CARE_PROVIDER_SITE_OTHER): Payer: Medicare Other | Admitting: Adult Health

## 2021-08-21 VITALS — BP 145/96 | HR 66 | Temp 98.2°F | Ht 69.0 in | Wt 317.0 lb

## 2021-08-21 DIAGNOSIS — E669 Obesity, unspecified: Secondary | ICD-10-CM | POA: Diagnosis not present

## 2021-08-21 DIAGNOSIS — Z6841 Body Mass Index (BMI) 40.0 and over, adult: Secondary | ICD-10-CM | POA: Diagnosis not present

## 2021-08-21 DIAGNOSIS — I10 Essential (primary) hypertension: Secondary | ICD-10-CM

## 2021-08-21 DIAGNOSIS — R7989 Other specified abnormal findings of blood chemistry: Secondary | ICD-10-CM | POA: Diagnosis not present

## 2021-08-22 ENCOUNTER — Encounter: Payer: Medicare Other | Admitting: Physical Therapy

## 2021-08-23 ENCOUNTER — Ambulatory Visit: Payer: Medicare Other | Admitting: Physical Therapy

## 2021-08-23 ENCOUNTER — Encounter: Payer: Self-pay | Admitting: Physical Therapy

## 2021-08-23 DIAGNOSIS — M6281 Muscle weakness (generalized): Secondary | ICD-10-CM

## 2021-08-23 DIAGNOSIS — G8929 Other chronic pain: Secondary | ICD-10-CM

## 2021-08-23 DIAGNOSIS — M25511 Pain in right shoulder: Secondary | ICD-10-CM | POA: Diagnosis not present

## 2021-08-23 DIAGNOSIS — M25611 Stiffness of right shoulder, not elsewhere classified: Secondary | ICD-10-CM

## 2021-08-23 NOTE — Therapy (Signed)
OUTPATIENT PHYSICAL THERAPY TREATMENT NOTE   Patient Name: Michelle Mcmillan MRN: [id number] DOB:1970-04-08, 51 y.o., female Today's Date: 08/23/2021  PCP: Donnie Coffin, MD REFERRING PROVIDER: Esaw Grandchild, NP  END OF SESSION:   PT End of Session - 08/23/21 1457     Visit Number 6    Number of Visits 13    Date for PT Re-Evaluation 09/13/21    Authorization Type UHC Medicare/Medicaid, VL based on medical necessity    Progress Note Due on Visit 10    PT Start Time 1300    PT Stop Time 1345    PT Time Calculation (min) 45 min    Activity Tolerance Patient tolerated treatment well    Behavior During Therapy [facility] for tasks assessed/performed               Past Medical History:  Diagnosis Date   Anemia    H/O   Anemia    Arthritis    RIGHT KNEE   Back pain    Breast cancer ([place]) 2017   right breast   Cancer ([place])    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)   Carpal tunnel syndrome    Constipation    Depression    Dyspnea    GERD (gastroesophageal reflux disease)    NO MEDS   Headache    migraines   Hypertension    Joint pain    Last menstrual period (LMP) > 10 days ago 08/2015   Lower extremity edema    Migraines    Neuropathy    OSA (obstructive sleep apnea)    Personal history of radiation therapy    Prediabetes    SOB (shortness of breath)    Past Surgical History:  Procedure Laterality Date   BREAST BIOPSY Right 05/08/2017   invasive mamm. ca   BREAST EXCISIONAL BIOPSY Right 06/15/15   breast cancer   BREAST LUMPECTOMY WITH NEEDLE LOCALIZATION Right 06/15/2015   Procedure: BREAST LUMPECTOMY WITH NEEDLE LOCALIZATION;  Surgeon: Hubbard Robinson, MD;  Location: [facility];  Service: General;  Laterality: Right;   BREAST RECONSTRUCTION WITH PLACEMENT OF TISSUE EXPANDER AND FLEX HD (ACELLULAR HYDRATED DERMIS) Left 02/09/2018   Procedure: BREAST RECONSTRUCTION WITH PLACEMENT OF TISSUE EXPANDER AND FLEX HD (ACELLULAR HYDRATED DERMIS);  Surgeon: Wallace Going, DO;   Location: [facility];  Service: Plastics;  Laterality: Left;   BREAST SURGERY     CESAREAN SECTION  1988   x1   COLONOSCOPY WITH PROPOFOL N/A 01/23/2017   Procedure: COLONOSCOPY WITH PROPOFOL;  Surgeon: Lin Landsman, MD;  Location: [facility] ENDOSCOPY;  Service: Gastroenterology;  Laterality: N/A;   ESOPHAGOGASTRODUODENOSCOPY (EGD) WITH PROPOFOL N/A 01/23/2017   Procedure: ESOPHAGOGASTRODUODENOSCOPY (EGD) WITH PROPOFOL;  Surgeon: Lin Landsman, MD;  Location: [facility] ENDOSCOPY;  Service: Gastroenterology;  Laterality: N/A;   LATISSIMUS FLAP TO BREAST Right 06/01/2018   Procedure: RIGHT BREAST RECONSTRUCTION WITH LATISSIMUS MYOCUTANEOUS FLAP;  Surgeon: Wallace Going, DO;  Location: [place];  Service: Plastics;  Laterality: Right;   LIPOSUCTION WITH LIPOFILLING Bilateral 10/15/2018   Procedure: LIPOSUCTION TO BILATERAL BREASTS;  Surgeon: Wallace Going, DO;  Location: [place];  Service: Plastics;  Laterality: Bilateral;   LIPOSUCTION WITH LIPOFILLING Bilateral 04/22/2019   Procedure: fat grafting/liposuction and lipofilling bilateral breasts;  Surgeon: Wallace Going, DO;  Location: [place];  Service: Plastics;  Laterality: Bilateral;   MASS EXCISION N/A 05/20/2016   Procedure: EXCISION CHEST WALL MASS;  Surgeon: Florene Glen, MD;  Location:  [facility];  Service: General;  Laterality: N/A;   RECONSTRUCTION BREAST W/ LATISSIMUS DORSI FLAP Right 06/01/2018   REMOVAL OF BILATERAL TISSUE EXPANDERS WITH PLACEMENT OF BILATERAL BREAST IMPLANTS Bilateral 10/15/2018   Procedure: REMOVAL OF BILATERAL TISSUE EXPANDERS WITH PLACEMENT OF BILATERAL BREAST IMPLANTS;  Surgeon: Wallace Going, DO;  Location: [place];  Service: Plastics;  Laterality: Bilateral;   SCAR REVISION Bilateral 04/22/2019   Procedure: Excision of bilateral scar contracture;  Surgeon: Wallace Going, DO;  Location: [place];  Service: Plastics;   Laterality: Bilateral;  total case 90 min   SENTINEL NODE BIOPSY Right 06/15/2015   Procedure: SENTINEL NODE BIOPSY;  Surgeon: Hubbard Robinson, MD;  Location: [facility];  Service: General;  Laterality: Right;   TOTAL MASTECTOMY Right 05/22/2017   Procedure: TOTAL MASTECTOMY;  Surgeon: Florene Glen, MD;  Location: [facility];  Service: General;  Laterality: Right;   TOTAL MASTECTOMY Left 02/09/2018   Procedure: LEFT PROPHYLACTIC  MASTECTOMY;  Surgeon: Jovita Kussmaul, MD;  Location: [facility];  Service: General;  Laterality: Left;   TUBAL LIGATION  2004   ULNAR NERVE TRANSPOSITION Left 01/29/2019   Procedure: LEFT ULNAR NERVE DECOMPRESSION/TRANSPOSITION;  Surgeon: Leanora Cover, MD;  Location: [place];  Service: Orthopedics;  Laterality: Left;   Patient Active Problem List   Diagnosis Date Noted   Elevated blood pressure reading in office with white coat syndrome, with diagnosis of hypertension 08/08/2021   Limited joint range of motion (ROM) 07/19/2021   Class 3 severe obesity due to excess calories with body mass index (BMI) of 45.0 to 49.9 in adult ([place]) 06/25/2021   Prediabetes 06/21/2021   Elevated ferritin level 06/21/2021   OSA on CPAP 04/20/2021   Migraine headache without aura 07/21/2020   Ulnar neuropathy 08/28/2018   S/P breast reconstruction, bilateral 06/16/2018   Acquired absence of right breast 06/01/2018   S/P mastectomy, bilateral 02/17/2018   Acquired absence of left breast 02/09/2018   Acquired absence of breast and absent nipple, right 01/02/2018   Postoperative breast asymmetry 01/02/2018   Intractable nausea and vomiting 08/11/2017   Breast cancer ([place]) 05/22/2017   Morbid (severe) obesity due to excess calories ([place]) 04/15/2017   Microcytic anemia 04/15/2017   GERD (gastroesophageal reflux disease) 04/15/2017   Chest wall mass    Malignant neoplasm of upper-outer quadrant of right female breast ([place]) 05/25/2015   HTN (hypertension) 05/24/2015     REFERRING DIAG: M25.60 (ICD-10-CM) - Limited joint range of motion (ROM)  THERAPY DIAG:  Chronic right shoulder pain  Stiffness of right shoulder, not elsewhere classified  Muscle weakness (generalized)  PERTINENT HISTORY: Patient is a 51 year old female with R shoulder mobility deficits and R hand weakness. Pt reports undergoing mastectomy on her R side in 2019 and undergoing lymphedema therapy. She reports difficulty with accessing overhead motion on her R arm since then. Patient reports having to re-position arm when reaching overhead - she . She reports difficulty with her hands "locking up." Patient reports this is worse in her L hand presently. Pt has Hx of L ulnar nerve transposition in 2020 and Hx of carpel tunnel syndrome on Med Hx. She reports frequently dropping items when holding onto pen or kitchen items/spices. Patient is R hand dominant. Pt is in remission for breast cancer - since her surgery/chemo in fall of 2019. Pt reports fall off her front steps 9 months ago. Patient reports no imaging at the time; mainly integumentary injuries/scrapes. Pt  has intermittent episodes of dizziness - pt describes it as "woozy" feeling and blurry vision; pt does not describe room spinning sensation. No unexplained weight loss. Pt reports intermittent disturbed sleep due to R hip pain  PRECAUTIONS: None  OBJECTIVE: (objective measures completed at initial evaluation unless otherwise dated)    DIAGNOSTIC FINDINGS:  None for R shoulder or R upper limb recently    PATIENT SURVEYS:  FOTO 62   COGNITION:           Overall cognitive status: Within functional limits for tasks assessed                                  SENSATION: WFL   POSTURE: Rounded shoulders, anteriorly tilted scapulae, increased thoracic kyphosis   UPPER EXTREMITY ROM:    Active ROM Right 08/01/2021 Left 08/01/2021  Shoulder flexion 113* 130  Shoulder extension      Shoulder abduction 84* 149  Shoulder adduction       Shoulder internal rotation      Shoulder external rotation 78 83  Elbow flexion WNL WNL  Elbow extension -8 -4  Wrist flexion WNL WNL  Wrist extension WNL WNL  Wrist ulnar deviation WNL WNL  Wrist radial deviation WNL WNL  Wrist pronation WNL WNL  Wrist supination WNL WNL  (Blank rows = not tested)       Passive ROM Right 08/01/2021 Left 08/01/2021  Shoulder flexion 130*    Shoulder extension      Shoulder abduction 90*    Shoulder adduction      Shoulder internal rotation [facility]    Shoulder external rotation WFL    (Blank rows = not tested) *Indicates pain     UPPER EXTREMITY MMT:   MMT Right 08/03/2021 Left 08/03/2021  Shoulder flexion -4 4  Shoulder extension      Shoulder abduction 4 4+  Shoulder adduction      Shoulder internal rotation 4+* 5  Shoulder external rotation 4+ 5  Middle trapezius      Lower trapezius      Elbow flexion 4+ 5  Elbow extension 4+ 5  Wrist flexion 4    Wrist extension 5    Wrist ulnar deviation 5    Wrist radial deviation 5    Wrist pronation 5*    Wrist supination 5    Grip strength (lbs) with JAMAR dynamometer (best of 3) 53.9 lb 81.3 lb  (Blank rows = not tested)   SHOULDER SPECIAL TESTS:            Impingement tests: Neer impingement test: positive , Hawkins/Kennedy impingement test: positive , and Painful arc test: positive                 Rotator cuff assessment: Empty Can Positive, External rotation lag sign Negative   WRIST SPECIAL TESTS Tinel's sign (carpel tunnel): negative, Phalen's negative, Reverse Phalen's negative   08/06/21 ULTT's: pain with R median and ulnar nerve glide, no NT or referred pain to wrist/hand      JOINT MOBILITY TESTING:  Glenohumeral joint: Moderate restriction posterior and inferior 08/06/21 Scapulothoracic: decreased protraction and upward rotation of scapula   PALPATION:  Tenderness to palpation along R middle deltoid, R bicipital groove, R lateral pectoralis major      TREATMENT  TODAY  SUBJECTIVE: Patient reports she is doing well today. Right upper trap is achy after she drove to [place] and back  and helped both her son and daughter move since her last visit. She states she is seeing "floaters" in her vision more frequently than typical (has been seeing floaters since high school) after having her eyes dilated at the eye doctor yesterday. She has bone density scan on 6/5. States she has not felt nerve pain in RUE since nerve glides last session.   PAIN:  Are you having pain? No Pain location: R shoulder joint    Manual Therapy -   STM to right upper trap, x8 minutes  Supine gentle R shoulder PROM witihin patient tolerance x3 minutes Supine R shoulder GH AP mobilizations with shoulder at varying angles, grade II-III, 30s/bout x multiple bouts; Supine R shoulder GH inferior mobilizations with shoulder at varying angles and available end range ER, grade II-III, 30s/bout x multiple bouts; Supine upper trap and levator scap stretch, x30 seconds each     *not performed this date* L sidelying R scapular upward rotation mobilizations, grade III, 30s/bout x 2 bouts; Radial, ulnar and median nerve glides to test for nerve pain patient has been feeling. Discovered reproduction of symptoms with median nerve. Taught median nerve glides and performed x12 within session    Therapeutic Exercise -   Serratus protraction in modified push-up position (hands on table) 2 x 10; Seated R shoulder flexion with 1# DB x10, 2# DB x10; Seated R shoulder abduction with 1# DB x10, 2# DB x10; Seated R shoulder ER with YellowTB 2 x 10; Seated R upper trap and levator scap stretch, 2x30 seconds each      PATIENT EDUCATION: Education details: Pt educated throughout session about proper posture and technique with exercises. Improved exercise technique, movement at target joints, use of target muscles after min to mod verbal, visual, tactile cues.  Person educated: Patient Education  method: Explanation and demonstration Education comprehension: verbalized understanding and returned demonstration     HOME EXERCISE PROGRAM: Access Code [email or web address]  Added to HEP: resisted GH flexion, abduction and ER.  Encouraged upper trap and levator scap stretching tonight as follow up to [facility] performed this date.                ASSESSMENT:   CLINICAL IMPRESSION: Pt is pleasant and motivated throughout session. Palpable trigger point identified in right upper trap; STM with graded pressure and trigger point release performed for symptom modulation. Increased crepitus and occasional "popping" in right shoulder noted with PROM, not typical per previous encounters; this improved following joint mobs as did ROM. Progressed GH strengthening into sitting position and using 1-2# DB; pain free range only. No need for median nerve gliding after pt reported absent nerve irritation following last session. Pt will benefit from PT services to address deficits in strength, range of motion, and pain in order to return to full function at home.       OBJECTIVE IMPAIRMENTS decreased ROM, decreased strength, hypomobility, impaired flexibility, impaired UE functional use, postural dysfunction, and pain.    ACTIVITY LIMITATIONS cleaning and meal prep.    PERSONAL FACTORS 3+ comorbidities: (obesity, Hx of breast cancer and mastectomy, depression, HTN, Hx of carpel tunnel syndrome)   and time since onset of pt's condition are also affecting patient's functional outcome.      REHAB POTENTIAL: Good   CLINICAL DECISION MAKING: Evolving/moderate complexity   EVALUATION COMPLEXITY: Moderate     GOALS:     SHORT TERM GOALS: Target date: 08/23/2021   Patient will be compliant and independent with given HEP as needed  to augment PT intervention for carryover of in-clinic intervention  Baseline: 08/01/21: Baseline HEP initiated (see [employer] Access Code) Goal status: INITIAL     LONG TERM GOALS: Target date:  09/14/2021   Patient will demonstrate improved function as evidenced by a score of 66 on FOTO measure for full participation in activities at home and in the community.   Baseline: 08/01/21: 62 Goal status: INITIAL   2.  Patient will have R shoulder flexion and abduction within 10 degrees of contralateral shoulder indicative of improved ability to elevate R upper limb as needed for functional reaching, self-care ADLs, overhead activity Baseline: 08/01/21: Shoulder AROM R/L: Flexion 113*/130, Abduction 84*/149 (*Indicates pain) Goal status: INITIAL   3.  Patient will improve R grip strength to at least 79.5 lbs reflective of at least having grip strength within standard error of contralateral upper limb (based on baseline measures) indicative of improved ability to perform prehensile tasks and hold items in R hand Baseline: 08/01/21: Grip strength (JAMAR): R 53.9, L 81.3 Goal status: INITIAL   4.  Patient will improve shoulder and elbow MMTs by at least 1/2 MMT grade indicative of improved strength as needed for ability to perform daily functional lifting tasks Baseline: 08/01/21: MMT R/L Shoulder flexion 4-/4, shoulder abduction 4/4+, shoulder ER 4+/5, shoulder IR 4+/5 Goal status: INITIAL     PLAN: PT FREQUENCY: 2x/week   PT DURATION: 6 weeks   PLANNED INTERVENTIONS: Therapeutic exercises, Therapeutic activity, Neuromuscular re-education, Patient/Family education, Joint mobilization, Cryotherapy, Moist heat, and Manual therapy   PLAN FOR NEXT SESSION: Manual therapy for glenohumeral and scapulothoracic joint mobility, STM (middle deltoid, R pectoralis major), progressive shoulder ROM without pushing repetitively into painful arc, periscapular strengthening/postural re-edu, update HEP as needed with progression of exercises/activities in PT      Patrina Levering PT, DPT

## 2021-08-23 NOTE — Progress Notes (Signed)
Chief Complaint:   OBESITY Michelle Mcmillan is here to discuss her progress with her obesity treatment plan along with follow-up of her obesity related diagnoses. Michelle Mcmillan is on the Category 3 Plan with lunch options and states she is following her eating plan approximately 95% of the time. Michelle Mcmillan states she is biking and using her 5 pound weights for 20 to 45 minutes 2-3 times per week.  Today's visit was #: 5 Starting weight: 333 lbs Starting date: 06/20/2021 Today's weight: 317 lbs Today's date: 08/21/21 Total lbs lost to date: 16 Total lbs lost since last in-office visit: -3  Interim History:  She has increased daily water drinking. Goal: 6 cups/day-typical water intake 4 to 5 cups/day.  Subjective:   1. Elevated ferritin level [place] checked with her hematology/oncology team- they were not overly concerned- F/U with them in June 2023. She discontinued vitamin C. We reviewed her Anemia panel. Iron/TIBC/Ferritin/ %Sat    Component Value Date/Time   IRON 47 06/20/2021 0913   TIBC 222 (L) 06/20/2021 0913   FERRITIN 366 (H) 06/20/2021 0913   IRONPCTSAT 21 06/20/2021 0913    2. Elevated blood pressure reading in office with white coat syndrome, with diagnosis of hypertension Previously on 3 antihypertensives.   Weight loss due to cancer treatment - she lost 60 pounds.   PCP slowly reduced prescription regimen, has been off all antihypertensives since about 2018.   Home reading systolic blood pressure 224M, diastolic blood pressure 25O She denies acute cardiac sx's.  Assessment/Plan:   1. Elevated ferritin level Remain off oral vitamin C. Follow-up with hematology/oncology in June.  2. Elevated blood pressure reading in office with white coat syndrome, with diagnosis of hypertension Continue with weight loss efforts.   Monitor BP.  3. Obesity, Current BMI 46.8 Michelle Mcmillan is currently in the action stage of change. As such, her goal is to continue with weight loss efforts.  She has agreed to Category 3 Plan with lunch options.   Exercise goals: as is.  Behavioral modification strategies: increasing lean protein intake, decreasing simple carbohydrates, meal planning and cooking strategies, keeping healthy foods in the home, and planning for success.  Michelle Mcmillan has agreed to follow-up with our clinic in 2-3 weeks with Dr. Owens Shark. She was informed of the importance of frequent follow-up visits to maximize her success with intensive lifestyle modifications for her multiple health conditions.   Objective:   Blood pressure (!) 145/96, pulse 66, temperature 98.2 F (36.8 C), height '5\' 9"'$  (1.753 m), weight (!) 317 lb (143.8 kg), SpO2 98 %. Body mass index is 46.81 kg/m.  General: Cooperative, alert, well developed, in no acute distress. HEENT: Conjunctivae and lids unremarkable. Cardiovascular: Regular rhythm.  Lungs: Normal work of breathing. Neurologic: No focal deficits.   Lab Results  Component Value Date   CREATININE 1.07 (H) 06/20/2021   BUN 16 06/20/2021   NA 143 06/20/2021   K 4.1 06/20/2021   CL 103 06/20/2021   CO2 27 06/20/2021   Lab Results  Component Value Date   ALT 13 06/20/2021   AST 18 06/20/2021   ALKPHOS 111 06/20/2021   BILITOT <0.2 06/20/2021   Lab Results  Component Value Date   HGBA1C 6.0 (H) 06/20/2021   Lab Results  Component Value Date   INSULIN 11.8 06/20/2021   Lab Results  Component Value Date   TSH 2.020 06/20/2021   Lab Results  Component Value Date   CHOL 179 06/20/2021   HDL 65 06/20/2021  LDLCALC 105 (H) 06/20/2021   TRIG 47 06/20/2021   Lab Results  Component Value Date   VD25OH 45.2 06/20/2021   Lab Results  Component Value Date   WBC 7.9 06/02/2018   HGB 9.5 (L) 06/02/2018   HCT 36.3 06/20/2021   MCV 77.6 (L) 06/02/2018   PLT 216 06/02/2018   Lab Results  Component Value Date   IRON 47 06/20/2021   TIBC 222 (L) 06/20/2021   FERRITIN 366 (H) 06/20/2021    Attestation Statements:    Reviewed by clinician on day of visit: allergies, medications, problem list, medical history, surgical history, family history, social history, and previous encounter notes.  Time spent on visit including pre-visit chart review and post-visit care and charting was 28 minutes.   I, Georgianne Fick, FNP, am acting as Location manager for Mina Marble, NP.  I have reviewed the above documentation for accuracy and completeness, and I agree with the above. -  Hubbard Seldon d. Sylvester Salonga, NP-C

## 2021-08-29 ENCOUNTER — Ambulatory Visit: Payer: Medicare Other | Admitting: Physical Therapy

## 2021-08-29 ENCOUNTER — Encounter: Payer: Self-pay | Admitting: Physical Therapy

## 2021-08-29 DIAGNOSIS — M25611 Stiffness of right shoulder, not elsewhere classified: Secondary | ICD-10-CM

## 2021-08-29 DIAGNOSIS — G8929 Other chronic pain: Secondary | ICD-10-CM

## 2021-08-29 DIAGNOSIS — M25511 Pain in right shoulder: Secondary | ICD-10-CM | POA: Diagnosis not present

## 2021-08-29 DIAGNOSIS — M6281 Muscle weakness (generalized): Secondary | ICD-10-CM

## 2021-08-29 NOTE — Therapy (Signed)
OUTPATIENT PHYSICAL THERAPY TREATMENT NOTE   Patient Name: Michelle Mcmillan MRN: [id number] DOB:Dec 10, 1970, 51 y.o., female Today's Date: 08/29/2021  PCP: Donnie Coffin, MD REFERRING PROVIDER: Esaw Grandchild, NP  END OF SESSION:   PT End of Session - 08/29/21 1218     Visit Number 7    Number of Visits 13    Date for PT Re-Evaluation 09/13/21    Authorization Type UHC Medicare/Medicaid, VL based on medical necessity    Progress Note Due on Visit 10    PT Start Time [phone]    PT Stop Time 1017    PT Time Calculation (min) 39 min    Activity Tolerance Patient tolerated treatment well    Behavior During Therapy [facility] for tasks assessed/performed                Past Medical History:  Diagnosis Date   Anemia    H/O   Anemia    Arthritis    RIGHT KNEE   Back pain    Breast cancer ([place]) 2017   right breast   Cancer ([place])    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)   Carpal tunnel syndrome    Constipation    Depression    Dyspnea    GERD (gastroesophageal reflux disease)    NO MEDS   Headache    migraines   Hypertension    Joint pain    Last menstrual period (LMP) > 10 days ago 08/2015   Lower extremity edema    Migraines    Neuropathy    OSA (obstructive sleep apnea)    Personal history of radiation therapy    Prediabetes    SOB (shortness of breath)    Past Surgical History:  Procedure Laterality Date   BREAST BIOPSY Right 05/08/2017   invasive mamm. ca   BREAST EXCISIONAL BIOPSY Right 06/15/15   breast cancer   BREAST LUMPECTOMY WITH NEEDLE LOCALIZATION Right 06/15/2015   Procedure: BREAST LUMPECTOMY WITH NEEDLE LOCALIZATION;  Surgeon: Hubbard Robinson, MD;  Location: [facility];  Service: General;  Laterality: Right;   BREAST RECONSTRUCTION WITH PLACEMENT OF TISSUE EXPANDER AND FLEX HD (ACELLULAR HYDRATED DERMIS) Left 02/09/2018   Procedure: BREAST RECONSTRUCTION WITH PLACEMENT OF TISSUE EXPANDER AND FLEX HD (ACELLULAR HYDRATED DERMIS);  Surgeon: Wallace Going, DO;   Location: [facility];  Service: Plastics;  Laterality: Left;   BREAST SURGERY     CESAREAN SECTION  1988   x1   COLONOSCOPY WITH PROPOFOL N/A 01/23/2017   Procedure: COLONOSCOPY WITH PROPOFOL;  Surgeon: Lin Landsman, MD;  Location: [facility]COPY;  Service: Gastroenterology;  Laterality: N/A;   ESOPHAGOGASTRODUODENOSCOPY (EGD) WITH PROPOFOL N/A 01/23/2017   Procedure: ESOPHAGOGASTRODUODENOSCOPY (EGD) WITH PROPOFOL;  Surgeon: Lin Landsman, MD;  Location: [facility] ENDOSCOPY;  Service: Gastroenterology;  Laterality: N/A;   LATISSIMUS FLAP TO BREAST Right 06/01/2018   Procedure: RIGHT BREAST RECONSTRUCTION WITH LATISSIMUS MYOCUTANEOUS FLAP;  Surgeon: Wallace Going, DO;  Location: [place];  Service: Plastics;  Laterality: Right;   LIPOSUCTION WITH LIPOFILLING Bilateral 10/15/2018   Procedure: LIPOSUCTION TO BILATERAL BREASTS;  Surgeon: Wallace Going, DO;  Location: [place];  Service: Plastics;  Laterality: Bilateral;   LIPOSUCTION WITH LIPOFILLING Bilateral 04/22/2019   Procedure: fat grafting/liposuction and lipofilling bilateral breasts;  Surgeon: Wallace Going, DO;  Location: [place];  Service: Plastics;  Laterality: Bilateral;   MASS EXCISION N/A 05/20/2016   Procedure: EXCISION CHEST WALL MASS;  Surgeon: Florene Glen, MD;  Location: [facility];  Service: General;  Laterality: N/A;   RECONSTRUCTION BREAST W/ LATISSIMUS DORSI FLAP Right 06/01/2018   REMOVAL OF BILATERAL TISSUE EXPANDERS WITH PLACEMENT OF BILATERAL BREAST IMPLANTS Bilateral 10/15/2018   Procedure: REMOVAL OF BILATERAL TISSUE EXPANDERS WITH PLACEMENT OF BILATERAL BREAST IMPLANTS;  Surgeon: Wallace Going, DO;  Location: [place];  Service: Plastics;  Laterality: Bilateral;   SCAR REVISION Bilateral 04/22/2019   Procedure: Excision of bilateral scar contracture;  Surgeon: Wallace Going, DO;  Location: [place];  Service: Plastics;   Laterality: Bilateral;  total case 90 min   SENTINEL NODE BIOPSY Right 06/15/2015   Procedure: SENTINEL NODE BIOPSY;  Surgeon: Hubbard Robinson, MD;  Location: [facility];  Service: General;  Laterality: Right;   TOTAL MASTECTOMY Right 05/22/2017   Procedure: TOTAL MASTECTOMY;  Surgeon: Florene Glen, MD;  Location: [facility];  Service: General;  Laterality: Right;   TOTAL MASTECTOMY Left 02/09/2018   Procedure: LEFT PROPHYLACTIC  MASTECTOMY;  Surgeon: Jovita Kussmaul, MD;  Location: [facility];  Service: General;  Laterality: Left;   TUBAL LIGATION  2004   ULNAR NERVE TRANSPOSITION Left 01/29/2019   Procedure: LEFT ULNAR NERVE DECOMPRESSION/TRANSPOSITION;  Surgeon: Leanora Cover, MD;  Location: [place];  Service: Orthopedics;  Laterality: Left;   Patient Active Problem List   Diagnosis Date Noted   Elevated blood pressure reading in office with white coat syndrome, with diagnosis of hypertension 08/08/2021   Limited joint range of motion (ROM) 07/19/2021   Class 3 severe obesity due to excess calories with body mass index (BMI) of 45.0 to 49.9 in adult ([place]) 06/25/2021   Prediabetes 06/21/2021   Elevated ferritin level 06/21/2021   OSA on CPAP 04/20/2021   Migraine headache without aura 07/21/2020   Ulnar neuropathy 08/28/2018   S/P breast reconstruction, bilateral 06/16/2018   Acquired absence of right breast 06/01/2018   S/P mastectomy, bilateral 02/17/2018   Acquired absence of left breast 02/09/2018   Acquired absence of breast and absent nipple, right 01/02/2018   Postoperative breast asymmetry 01/02/2018   Intractable nausea and vomiting 08/11/2017   Breast cancer ([place]) 05/22/2017   Morbid (severe) obesity due to excess calories ([place]) 04/15/2017   Microcytic anemia 04/15/2017   GERD (gastroesophageal reflux disease) 04/15/2017   Chest wall mass    Malignant neoplasm of upper-outer quadrant of right female breast ([place]) 05/25/2015   HTN (hypertension) 05/24/2015     REFERRING DIAG: M25.60 (ICD-10-CM) - Limited joint range of motion (ROM)  THERAPY DIAG:  Chronic right shoulder pain  Muscle weakness (generalized)  Stiffness of right shoulder, not elsewhere classified  PERTINENT HISTORY: Patient is a 51 year old female with R shoulder mobility deficits and R hand weakness. Pt reports undergoing mastectomy on her R side in 2019 and undergoing lymphedema therapy. She reports difficulty with accessing overhead motion on her R arm since then. Patient reports having to re-position arm when reaching overhead - she . She reports difficulty with her hands "locking up." Patient reports this is worse in her L hand presently. Pt has Hx of L ulnar nerve transposition in 2020 and Hx of carpel tunnel syndrome on Med Hx. She reports frequently dropping items when holding onto pen or kitchen items/spices. Patient is R hand dominant. Pt is in remission for breast cancer - since her surgery/chemo in fall of 2019. Pt reports fall off her front steps 9 months ago. Patient reports no imaging at the time; mainly integumentary injuries/scrapes.  Pt has intermittent episodes of dizziness - pt describes it as "woozy" feeling and blurry vision; pt does not describe room spinning sensation. No unexplained weight loss. Pt reports intermittent disturbed sleep due to R hip pain  PRECAUTIONS: None  OBJECTIVE: (objective measures completed at initial evaluation unless otherwise dated)    DIAGNOSTIC FINDINGS:  None for R shoulder or R upper limb recently    PATIENT SURVEYS:  FOTO 62   COGNITION:           Overall cognitive status: Within functional limits for tasks assessed                                  SENSATION: WFL   POSTURE: Rounded shoulders, anteriorly tilted scapulae, increased thoracic kyphosis   UPPER EXTREMITY ROM:    Active ROM Right 08/01/2021 Left 08/01/2021  Shoulder flexion 113* 130  Shoulder extension      Shoulder abduction 84* 149  Shoulder adduction       Shoulder internal rotation      Shoulder external rotation 78 83  Elbow flexion WNL WNL  Elbow extension -8 -4  Wrist flexion WNL WNL  Wrist extension WNL WNL  Wrist ulnar deviation WNL WNL  Wrist radial deviation WNL WNL  Wrist pronation WNL WNL  Wrist supination WNL WNL  (Blank rows = not tested)       Passive ROM Right 08/01/2021 Left 08/01/2021  Shoulder flexion 130*    Shoulder extension      Shoulder abduction 90*    Shoulder adduction      Shoulder internal rotation [facility]    Shoulder external rotation WFL    (Blank rows = not tested) *Indicates pain     UPPER EXTREMITY MMT:   MMT Right 08/03/2021 Left 08/03/2021  Shoulder flexion -4 4  Shoulder extension      Shoulder abduction 4 4+  Shoulder adduction      Shoulder internal rotation 4+* 5  Shoulder external rotation 4+ 5  Middle trapezius      Lower trapezius      Elbow flexion 4+ 5  Elbow extension 4+ 5  Wrist flexion 4    Wrist extension 5    Wrist ulnar deviation 5    Wrist radial deviation 5    Wrist pronation 5*    Wrist supination 5    Grip strength (lbs) with JAMAR dynamometer (best of 3) 53.9 lb 81.3 lb  (Blank rows = not tested)   SHOULDER SPECIAL TESTS:            Impingement tests: Neer impingement test: positive , Hawkins/Kennedy impingement test: positive , and Painful arc test: positive                 Rotator cuff assessment: Empty Can Positive, External rotation lag sign Negative   WRIST SPECIAL TESTS Tinel's sign (carpel tunnel): negative, Phalen's negative, Reverse Phalen's negative   08/06/21 ULTT's: pain with R median and ulnar nerve glide, no NT or referred pain to wrist/hand      JOINT MOBILITY TESTING:  Glenohumeral joint: Moderate restriction posterior and inferior 08/06/21 Scapulothoracic: decreased protraction and upward rotation of scapula   PALPATION:  Tenderness to palpation along R middle deltoid, R bicipital groove, R lateral pectoralis major      TREATMENT  TODAY  SUBJECTIVE: Patient reports she is doing well today. She reports no pain with overhead reaching as of lately. She  has noticed increased clicking and popping in right shoulder and pain with combination of IR and abduction (pain in lateral deltoid). She has a bone density scan on 09/03/21.    PAIN:  Are you having pain? 5/10 Pain location: R shoulder joint, lateral upper arm    Manual Therapy -   STM to right anterior and middle deltoid, x8 minutes  Supine gentle R shoulder PROM witihin patient tolerance x2 minutes Supine R shoulder GH AP mobilizations with shoulder at varying angles, grade II-III, 30s/bout x multiple bouts; Supine R shoulder GH inferior mobilizations with shoulder at varying angles and available end range ER, grade II-III, 30s/bout x multiple bouts;     *not performed this date* L sidelying R scapular upward rotation mobilizations, grade III, 30s/bout x 2 bouts; Radial, ulnar and median nerve glides to test for nerve pain patient has been feeling. Discovered reproduction of symptoms with median nerve. Taught median nerve glides and performed x12 within session    Therapeutic Exercise -   Seated upper trap and levator scap stretch, x30 seconds each  Serratus protraction in modified push-up position (hands on table) 2 x 10; Scapular retraction, 2 x 10 Seated R shoulder flexion to 90d with 2# DB 2x10; Seated R shoulder abduction to 90d with 2# DB 2x10; R shoulder abduction with elbow at 90d, neutral rotation with 2# DB 2x10; Seated R upper trap and levator scap stretch, 2x30 seconds each     *not today* Seated R shoulder ER with YellowTB 2 x 10;    PATIENT EDUCATION: Education details: Pt educated throughout session about proper posture and technique with exercises. Improved exercise technique, movement at target joints, use of target muscles after min to mod verbal, visual, tactile cues.  Person educated: Patient Education method: Explanation and  demonstration Education comprehension: verbalized understanding and returned demonstration     HOME EXERCISE PROGRAM: Access Code [email or web address]                 ASSESSMENT:   CLINICAL IMPRESSION: Pt is pleasant and motivated throughout session. No palpable trigger point in upper trap however 2 defined points in riht anterior and middle deltoid. STM performed to this area with significant reduction in pain following. She continues to present with clicking/popping in right [facility] joint with passive and active ROM greater than 110d in both flexion and abduction however primarily flexion. PT instructed pt to perform strengthening through pain-free and crepitus-free range. Progressed resistance to 2# DB with correct technique. Pt no longer reporting pain with abduction/IR combination at end of session. Will plan to continue progressing as able. Pt will benefit from PT services to address deficits in strength, range of motion, and pain in order to return to full function at home.       OBJECTIVE IMPAIRMENTS decreased ROM, decreased strength, hypomobility, impaired flexibility, impaired UE functional use, postural dysfunction, and pain.    ACTIVITY LIMITATIONS cleaning and meal prep.    PERSONAL FACTORS 3+ comorbidities: (obesity, Hx of breast cancer and mastectomy, depression, HTN, Hx of carpel tunnel syndrome)   and time since onset of pt's condition are also affecting patient's functional outcome.      REHAB POTENTIAL: Good   CLINICAL DECISION MAKING: Evolving/moderate complexity   EVALUATION COMPLEXITY: Moderate     GOALS:     SHORT TERM GOALS: Target date: 08/23/2021   Patient will be compliant and independent with given HEP as needed to augment PT intervention for carryover of in-clinic intervention  Baseline: 08/01/21: Baseline HEP  initiated (see [employer] Access Code) Goal status: INITIAL     LONG TERM GOALS: Target date: 09/14/2021   Patient will demonstrate improved function as  evidenced by a score of 66 on FOTO measure for full participation in activities at home and in the community.   Baseline: 08/01/21: 62 Goal status: INITIAL   2.  Patient will have R shoulder flexion and abduction within 10 degrees of contralateral shoulder indicative of improved ability to elevate R upper limb as needed for functional reaching, self-care ADLs, overhead activity Baseline: 08/01/21: Shoulder AROM R/L: Flexion 113*/130, Abduction 84*/149 (*Indicates pain) Goal status: INITIAL   3.  Patient will improve R grip strength to at least 79.5 lbs reflective of at least having grip strength within standard error of contralateral upper limb (based on baseline measures) indicative of improved ability to perform prehensile tasks and hold items in R hand Baseline: 08/01/21: Grip strength (JAMAR): R 53.9, L 81.3 Goal status: INITIAL   4.  Patient will improve shoulder and elbow MMTs by at least 1/2 MMT grade indicative of improved strength as needed for ability to perform daily functional lifting tasks Baseline: 08/01/21: MMT R/L Shoulder flexion 4-/4, shoulder abduction 4/4+, shoulder ER 4+/5, shoulder IR 4+/5 Goal status: INITIAL     PLAN: PT FREQUENCY: 2x/week   PT DURATION: 6 weeks   PLANNED INTERVENTIONS: Therapeutic exercises, Therapeutic activity, Neuromuscular re-education, Patient/Family education, Joint mobilization, Cryotherapy, Moist heat, and Manual therapy   PLAN FOR NEXT SESSION: Manual therapy for glenohumeral and scapulothoracic joint mobility, STM (middle deltoid, R pectoralis major), progressive shoulder ROM without pushing repetitively into painful arc, periscapular strengthening/postural re-edu, update HEP as needed with progression of exercises/activities in PT      Patrina Levering PT, DPT

## 2021-09-03 ENCOUNTER — Encounter: Payer: Self-pay | Admitting: Physical Therapy

## 2021-09-03 ENCOUNTER — Ambulatory Visit: Payer: Medicare Other | Attending: Adult Health | Admitting: Physical Therapy

## 2021-09-03 ENCOUNTER — Ambulatory Visit
Admission: RE | Admit: 2021-09-03 | Discharge: 2021-09-03 | Disposition: A | Discharge status: Home / Self Care | Payer: Medicare Other | Source: Ambulatory Visit | Attending: Oncology | Admitting: Oncology

## 2021-09-03 DIAGNOSIS — Z170 Estrogen receptor positive status [ER+]: Secondary | ICD-10-CM | POA: Diagnosis present

## 2021-09-03 DIAGNOSIS — Z780 Asymptomatic menopausal state: Secondary | ICD-10-CM | POA: Diagnosis not present

## 2021-09-03 DIAGNOSIS — C50411 Malignant neoplasm of upper-outer quadrant of right female breast: Secondary | ICD-10-CM | POA: Diagnosis present

## 2021-09-03 DIAGNOSIS — Z13820 Encounter for screening for osteoporosis: Secondary | ICD-10-CM | POA: Diagnosis not present

## 2021-09-03 DIAGNOSIS — M6281 Muscle weakness (generalized): Secondary | ICD-10-CM | POA: Insufficient documentation

## 2021-09-03 DIAGNOSIS — Z9221 Personal history of antineoplastic chemotherapy: Secondary | ICD-10-CM | POA: Insufficient documentation

## 2021-09-03 DIAGNOSIS — M25511 Pain in right shoulder: Secondary | ICD-10-CM | POA: Diagnosis present

## 2021-09-03 DIAGNOSIS — M25611 Stiffness of right shoulder, not elsewhere classified: Secondary | ICD-10-CM | POA: Insufficient documentation

## 2021-09-03 DIAGNOSIS — G8929 Other chronic pain: Secondary | ICD-10-CM | POA: Insufficient documentation

## 2021-09-03 DIAGNOSIS — Z923 Personal history of irradiation: Secondary | ICD-10-CM | POA: Diagnosis not present

## 2021-09-03 NOTE — Therapy (Signed)
OUTPATIENT PHYSICAL THERAPY TREATMENT NOTE   Patient Name: Michelle Mcmillan MRN: [id number] DOB:07-19-1970, 51 y.o., female Today's Date: 09/03/2021  PCP: Donnie Coffin, MD REFERRING PROVIDER: Esaw Grandchild, NP  END OF SESSION:   PT End of Session - 09/03/21 1130     Visit Number 8    Number of Visits 13    Date for PT Re-Evaluation 09/13/21    Authorization Type UHC Medicare/Medicaid, VL based on medical necessity    Progress Note Due on Visit 10    PT Start Time 0938    PT Stop Time 1016    PT Time Calculation (min) 38 min    Activity Tolerance Patient tolerated treatment well    Behavior During Therapy [facility] for tasks assessed/performed                 Past Medical History:  Diagnosis Date   Anemia    H/O   Anemia    Arthritis    RIGHT KNEE   Back pain    Breast cancer ([place]) 2017   right breast   Cancer ([place])    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)   Carpal tunnel syndrome    Constipation    Depression    Dyspnea    GERD (gastroesophageal reflux disease)    NO MEDS   Headache    migraines   Hypertension    Joint pain    Last menstrual period (LMP) > 10 days ago 08/2015   Lower extremity edema    Migraines    Neuropathy    OSA (obstructive sleep apnea)    Personal history of radiation therapy    Prediabetes    SOB (shortness of breath)    Past Surgical History:  Procedure Laterality Date   BREAST BIOPSY Right 05/08/2017   invasive mamm. ca   BREAST EXCISIONAL BIOPSY Right 06/15/15   breast cancer   BREAST LUMPECTOMY WITH NEEDLE LOCALIZATION Right 06/15/2015   Procedure: BREAST LUMPECTOMY WITH NEEDLE LOCALIZATION;  Surgeon: Hubbard Robinson, MD;  Location: [facility];  Service: General;  Laterality: Right;   BREAST RECONSTRUCTION WITH PLACEMENT OF TISSUE EXPANDER AND FLEX HD (ACELLULAR HYDRATED DERMIS) Left 02/09/2018   Procedure: BREAST RECONSTRUCTION WITH PLACEMENT OF TISSUE EXPANDER AND FLEX HD (ACELLULAR HYDRATED DERMIS);  Surgeon: Wallace Going,  DO;  Location: [facility];  Service: Plastics;  Laterality: Left;   BREAST SURGERY     CESAREAN SECTION  1988   x1   COLONOSCOPY WITH PROPOFOL N/A 01/23/2017   Procedure: COLONOSCOPY WITH PROPOFOL;  Surgeon: Lin Landsman, MD;  Location: [facility]COPY;  Service: Gastroenterology;  Laterality: N/A;   ESOPHAGOGASTRODUODENOSCOPY (EGD) WITH PROPOFOL N/A 01/23/2017   Procedure: ESOPHAGOGASTRODUODENOSCOPY (EGD) WITH PROPOFOL;  Surgeon: Lin Landsman, MD;  Location: [facility] ENDOSCOPY;  Service: Gastroenterology;  Laterality: N/A;   LATISSIMUS FLAP TO BREAST Right 06/01/2018   Procedure: RIGHT BREAST RECONSTRUCTION WITH LATISSIMUS MYOCUTANEOUS FLAP;  Surgeon: Wallace Going, DO;  Location: [place];  Service: Plastics;  Laterality: Right;   LIPOSUCTION WITH LIPOFILLING Bilateral 10/15/2018   Procedure: LIPOSUCTION TO BILATERAL BREASTS;  Surgeon: Wallace Going, DO;  Location: [place];  Service: Plastics;  Laterality: Bilateral;   LIPOSUCTION WITH LIPOFILLING Bilateral 04/22/2019   Procedure: fat grafting/liposuction and lipofilling bilateral breasts;  Surgeon: Wallace Going, DO;  Location: [place];  Service: Plastics;  Laterality: Bilateral;   MASS EXCISION N/A 05/20/2016   Procedure: EXCISION CHEST WALL MASS;  Surgeon: Florene Glen, MD;  Location: [facility];  Service: General;  Laterality: N/A;   RECONSTRUCTION BREAST W/ LATISSIMUS DORSI FLAP Right 06/01/2018   REMOVAL OF BILATERAL TISSUE EXPANDERS WITH PLACEMENT OF BILATERAL BREAST IMPLANTS Bilateral 10/15/2018   Procedure: REMOVAL OF BILATERAL TISSUE EXPANDERS WITH PLACEMENT OF BILATERAL BREAST IMPLANTS;  Surgeon: Wallace Going, DO;  Location: [place];  Service: Plastics;  Laterality: Bilateral;   SCAR REVISION Bilateral 04/22/2019   Procedure: Excision of bilateral scar contracture;  Surgeon: Wallace Going, DO;  Location: [place];  Service:  Plastics;  Laterality: Bilateral;  total case 90 min   SENTINEL NODE BIOPSY Right 06/15/2015   Procedure: SENTINEL NODE BIOPSY;  Surgeon: Hubbard Robinson, MD;  Location: [facility];  Service: General;  Laterality: Right;   TOTAL MASTECTOMY Right 05/22/2017   Procedure: TOTAL MASTECTOMY;  Surgeon: Florene Glen, MD;  Location: [facility];  Service: General;  Laterality: Right;   TOTAL MASTECTOMY Left 02/09/2018   Procedure: LEFT PROPHYLACTIC  MASTECTOMY;  Surgeon: Jovita Kussmaul, MD;  Location: [facility];  Service: General;  Laterality: Left;   TUBAL LIGATION  2004   ULNAR NERVE TRANSPOSITION Left 01/29/2019   Procedure: LEFT ULNAR NERVE DECOMPRESSION/TRANSPOSITION;  Surgeon: Leanora Cover, MD;  Location: [place];  Service: Orthopedics;  Laterality: Left;   Patient Active Problem List   Diagnosis Date Noted   Elevated blood pressure reading in office with white coat syndrome, with diagnosis of hypertension 08/08/2021   Limited joint range of motion (ROM) 07/19/2021   Class 3 severe obesity due to excess calories with body mass index (BMI) of 45.0 to 49.9 in adult ([place]) 06/25/2021   Prediabetes 06/21/2021   Elevated ferritin level 06/21/2021   OSA on CPAP 04/20/2021   Migraine headache without aura 07/21/2020   Ulnar neuropathy 08/28/2018   S/P breast reconstruction, bilateral 06/16/2018   Acquired absence of right breast 06/01/2018   S/P mastectomy, bilateral 02/17/2018   Acquired absence of left breast 02/09/2018   Acquired absence of breast and absent nipple, right 01/02/2018   Postoperative breast asymmetry 01/02/2018   Intractable nausea and vomiting 08/11/2017   Breast cancer ([place]) 05/22/2017   Morbid (severe) obesity due to excess calories ([place]) 04/15/2017   Microcytic anemia 04/15/2017   GERD (gastroesophageal reflux disease) 04/15/2017   Chest wall mass    Malignant neoplasm of upper-outer quadrant of right female breast ([place]) 05/25/2015   HTN (hypertension)  05/24/2015    REFERRING DIAG: M25.60 (ICD-10-CM) - Limited joint range of motion (ROM)  THERAPY DIAG:  Chronic right shoulder pain  Muscle weakness (generalized)  PERTINENT HISTORY: Patient is a 51 year old female with R shoulder mobility deficits and R hand weakness. Pt reports undergoing mastectomy on her R side in 2019 and undergoing lymphedema therapy. She reports difficulty with accessing overhead motion on her R arm since then. Patient reports having to re-position arm when reaching overhead - she . She reports difficulty with her hands "locking up." Patient reports this is worse in her L hand presently. Pt has Hx of L ulnar nerve transposition in 2020 and Hx of carpel tunnel syndrome on Med Hx. She reports frequently dropping items when holding onto pen or kitchen items/spices. Patient is R hand dominant. Pt is in remission for breast cancer - since her surgery/chemo in fall of 2019. Pt reports fall off her front steps 9 months ago. Patient reports no imaging at the time; mainly integumentary injuries/scrapes. Pt has intermittent episodes of dizziness - pt  describes it as "woozy" feeling and blurry vision; pt does not describe room spinning sensation. No unexplained weight loss. Pt reports intermittent disturbed sleep due to R hip pain  PRECAUTIONS: None  OBJECTIVE: (objective measures completed at initial evaluation unless otherwise dated)    DIAGNOSTIC FINDINGS:  None for R shoulder or R upper limb recently    PATIENT SURVEYS:  FOTO 62   COGNITION:           Overall cognitive status: Within functional limits for tasks assessed                                  SENSATION: WFL   POSTURE: Rounded shoulders, anteriorly tilted scapulae, increased thoracic kyphosis   UPPER EXTREMITY ROM:    Active ROM Right 08/01/2021 Left 08/01/2021  Shoulder flexion 113* 130  Shoulder extension      Shoulder abduction 84* 149  Shoulder adduction      Shoulder internal rotation      Shoulder  external rotation 78 83  Elbow flexion WNL WNL  Elbow extension -8 -4  Wrist flexion WNL WNL  Wrist extension WNL WNL  Wrist ulnar deviation WNL WNL  Wrist radial deviation WNL WNL  Wrist pronation WNL WNL  Wrist supination WNL WNL  (Blank rows = not tested)       Passive ROM Right 08/01/2021 Left 08/01/2021  Shoulder flexion 130*    Shoulder extension      Shoulder abduction 90*    Shoulder adduction      Shoulder internal rotation [facility]    Shoulder external rotation WFL    (Blank rows = not tested) *Indicates pain     UPPER EXTREMITY MMT:   MMT Right 08/03/2021 Left 08/03/2021  Shoulder flexion -4 4  Shoulder extension      Shoulder abduction 4 4+  Shoulder adduction      Shoulder internal rotation 4+* 5  Shoulder external rotation 4+ 5  Middle trapezius      Lower trapezius      Elbow flexion 4+ 5  Elbow extension 4+ 5  Wrist flexion 4    Wrist extension 5    Wrist ulnar deviation 5    Wrist radial deviation 5    Wrist pronation 5*    Wrist supination 5    Grip strength (lbs) with JAMAR dynamometer (best of 3) 53.9 lb 81.3 lb  (Blank rows = not tested)   SHOULDER SPECIAL TESTS:            Impingement tests: Neer impingement test: positive , Hawkins/Kennedy impingement test: positive , and Painful arc test: positive                 Rotator cuff assessment: Empty Can Positive, External rotation lag sign Negative   WRIST SPECIAL TESTS Tinel's sign (carpel tunnel): negative, Phalen's negative, Reverse Phalen's negative   08/06/21 ULTT's: pain with R median and ulnar nerve glide, no NT or referred pain to wrist/hand      JOINT MOBILITY TESTING:  Glenohumeral joint: Moderate restriction posterior and inferior 08/06/21 Scapulothoracic: decreased protraction and upward rotation of scapula   PALPATION:  Tenderness to palpation along R middle deltoid, R bicipital groove, R lateral pectoralis major      TREATMENT TODAY  SUBJECTIVE: Patient reports she is doing  well today. She graduated from [employer] on Friday and had a graduation party on Saturday. States she spent yesterday recovering from  all the events. She reports no new updates. Does state deltoid has felt better since STM last session. She has a bone density scan today at 11am and appointment with oncologist on Thursday.       PAIN:  Are you having pain? 2/10 Pain location: R shoulder     Manual Therapy -   STM to right pectoral, primary tenderness at insertion, x8 minutes  Supine gentle R shoulder PROM to check ROM, x1 minute Supine R shoulder GH AP mobilizations with shoulder at varying angles, grade II-III, 30s/bout x multiple bouts; Supine R shoulder GH inferior mobilizations with shoulder at varying angles and available end range ER, grade II-III, 30s/bout x multiple bouts; Supine pectoral stretch with hands behind head and light OP at elbows, 2x30 seconds      *not performed this date* L sidelying R scapular upward rotation mobilizations, grade III, 30s/bout x 2 bouts; Radial, ulnar and median nerve glides to test for nerve pain patient has been feeling. Discovered reproduction of symptoms with median nerve. Taught median nerve glides and performed x12 within session    Therapeutic Exercise -   Seated row BUE, GreenTB, 2x10 Standing R shoulder flexion to 90d with 3# DB 2x10; Standing R shoulder abduction to 90d with 3# DB 2x10; Seated R upper trap and levator scap stretch, x30 seconds each  Standing pectoral stretch at doorway, x30 seconds RUE    *not today* Seated R shoulder ER with YellowTB 2 x 10; R shoulder abduction with elbow at 90d, neutral rotation with 2# DB 2x10; Serratus protraction in modified push-up position (hands on table) 2 x 10;    PATIENT EDUCATION: Education details: Pt educated throughout session about proper posture and technique with exercises. Improved exercise technique, movement at target joints, use of target muscles after min to mod verbal,  visual, tactile cues.  Person educated: Patient Education method: Explanation and demonstration Education comprehension: verbalized understanding and returned demonstration     HOME EXERCISE PROGRAM: Access Code [email or web address]                 ASSESSMENT:   CLINICAL IMPRESSION: Pt is pleasant and motivated throughout session. Strengthening interventions limited by late arrival. Tenderness (noted by grimacing) at insertion point of right pec major with diffuse pain in lateral/distal muscle. Tissue quality improved following STM however tenderness remains. Was able to progress resistance used in [facility] strengthening to 3# DB with no increase in pain or strain. Added a pec stretch to interventions and taught pt how to perform in doorway for home stretch; pt demonstrates good understanding and technique. Limited PROM this session to avoid excessive clicking/popping; none was experienced with the few reps performed to check ROM. Will plan to continue progressing as able. Pt will benefit from PT services to address deficits in strength, range of motion, and pain in order to return to full function at home.       OBJECTIVE IMPAIRMENTS decreased ROM, decreased strength, hypomobility, impaired flexibility, impaired UE functional use, postural dysfunction, and pain.    ACTIVITY LIMITATIONS cleaning and meal prep.    PERSONAL FACTORS 3+ comorbidities: (obesity, Hx of breast cancer and mastectomy, depression, HTN, Hx of carpel tunnel syndrome)   and time since onset of pt's condition are also affecting patient's functional outcome.      REHAB POTENTIAL: Good   CLINICAL DECISION MAKING: Evolving/moderate complexity   EVALUATION COMPLEXITY: Moderate     GOALS:     SHORT TERM GOALS: Target date: 08/23/2021   Patient  will be compliant and independent with given HEP as needed to augment PT intervention for carryover of in-clinic intervention  Baseline: 08/01/21: Baseline HEP initiated (see [employer] Access  Code) Goal status: INITIAL     LONG TERM GOALS: Target date: 09/14/2021   Patient will demonstrate improved function as evidenced by a score of 66 on FOTO measure for full participation in activities at home and in the community.   Baseline: 08/01/21: 62 Goal status: INITIAL   2.  Patient will have R shoulder flexion and abduction within 10 degrees of contralateral shoulder indicative of improved ability to elevate R upper limb as needed for functional reaching, self-care ADLs, overhead activity Baseline: 08/01/21: Shoulder AROM R/L: Flexion 113*/130, Abduction 84*/149 (*Indicates pain) Goal status: INITIAL   3.  Patient will improve R grip strength to at least 79.5 lbs reflective of at least having grip strength within standard error of contralateral upper limb (based on baseline measures) indicative of improved ability to perform prehensile tasks and hold items in R hand Baseline: 08/01/21: Grip strength (JAMAR): R 53.9, L 81.3 Goal status: INITIAL   4.  Patient will improve shoulder and elbow MMTs by at least 1/2 MMT grade indicative of improved strength as needed for ability to perform daily functional lifting tasks Baseline: 08/01/21: MMT R/L Shoulder flexion 4-/4, shoulder abduction 4/4+, shoulder ER 4+/5, shoulder IR 4+/5 Goal status: INITIAL     PLAN: PT FREQUENCY: 2x/week   PT DURATION: 6 weeks   PLANNED INTERVENTIONS: Therapeutic exercises, Therapeutic activity, Neuromuscular re-education, Patient/Family education, Joint mobilization, Cryotherapy, Moist heat, and Manual therapy   PLAN FOR NEXT SESSION: Manual therapy for glenohumeral and scapulothoracic joint mobility, STM (middle deltoid, R pectoralis major), progressive shoulder ROM without pushing repetitively into painful arc, periscapular strengthening/postural re-edu, update HEP as needed with progression of exercises/activities in PT      Patrina Levering PT, DPT

## 2021-09-04 ENCOUNTER — Encounter (INDEPENDENT_AMBULATORY_CARE_PROVIDER_SITE_OTHER): Payer: Self-pay | Admitting: Bariatrics

## 2021-09-04 ENCOUNTER — Ambulatory Visit (INDEPENDENT_AMBULATORY_CARE_PROVIDER_SITE_OTHER): Payer: Medicare Other | Admitting: Bariatrics

## 2021-09-04 VITALS — BP 150/94 | HR 63 | Temp 97.7°F | Ht 69.0 in | Wt 315.0 lb

## 2021-09-04 DIAGNOSIS — E669 Obesity, unspecified: Secondary | ICD-10-CM | POA: Diagnosis not present

## 2021-09-04 DIAGNOSIS — R7303 Prediabetes: Secondary | ICD-10-CM | POA: Diagnosis not present

## 2021-09-04 DIAGNOSIS — I10 Essential (primary) hypertension: Secondary | ICD-10-CM

## 2021-09-04 DIAGNOSIS — Z6841 Body Mass Index (BMI) 40.0 and over, adult: Secondary | ICD-10-CM

## 2021-09-04 MED ORDER — HYDROCHLOROTHIAZIDE 12.5 MG PO CAPS: 12.5000 mg | ORAL_CAPSULE | Freq: Every day | ORAL | 0 refills | Status: DC

## 2021-09-05 ENCOUNTER — Encounter: Payer: Self-pay | Admitting: Physical Therapy

## 2021-09-05 ENCOUNTER — Ambulatory Visit: Payer: Medicare Other | Admitting: Physical Therapy

## 2021-09-05 DIAGNOSIS — M25611 Stiffness of right shoulder, not elsewhere classified: Secondary | ICD-10-CM

## 2021-09-05 DIAGNOSIS — M6281 Muscle weakness (generalized): Secondary | ICD-10-CM

## 2021-09-05 DIAGNOSIS — G8929 Other chronic pain: Secondary | ICD-10-CM

## 2021-09-05 DIAGNOSIS — M25511 Pain in right shoulder: Secondary | ICD-10-CM | POA: Diagnosis not present

## 2021-09-05 NOTE — Therapy (Signed)
OUTPATIENT PHYSICAL THERAPY TREATMENT NOTE   Patient Name: Michelle Mcmillan MRN: [id number] DOB:[birth year], 51 y.o., female Today's Date: 09/05/2021  PCP: Donnie Coffin, MD REFERRING PROVIDER: Esaw Grandchild, NP  END OF SESSION:   PT End of Session - 09/05/21 1000     Visit Number 9    Number of Visits 13    Date for PT Re-Evaluation 09/13/21    Authorization Type UHC Medicare/Medicaid, VL based on medical necessity    Progress Note Due on Visit 10    PT Start Time 0937    PT Stop Time 1020    PT Time Calculation (min) 43 min    Activity Tolerance Patient tolerated treatment well    Behavior During Therapy [facility] for tasks assessed/performed             Past Medical History:  Diagnosis Date   Anemia    H/O   Anemia    Arthritis    RIGHT KNEE   Back pain    Breast cancer ([place]) 2017   right breast   Cancer ([place])    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)   Carpal tunnel syndrome    Constipation    Depression    Dyspnea    GERD (gastroesophageal reflux disease)    NO MEDS   Headache    migraines   Hypertension    Joint pain    Last menstrual period (LMP) > 10 days ago 08/2015   Lower extremity edema    Migraines    Neuropathy    OSA (obstructive sleep apnea)    Personal history of radiation therapy    Prediabetes    SOB (shortness of breath)    Past Surgical History:  Procedure Laterality Date   BREAST BIOPSY Right 05/08/2017   invasive mamm. ca   BREAST EXCISIONAL BIOPSY Right 06/15/15   breast cancer   BREAST LUMPECTOMY WITH NEEDLE LOCALIZATION Right 06/15/2015   Procedure: BREAST LUMPECTOMY WITH NEEDLE LOCALIZATION;  Surgeon: Hubbard Robinson, MD;  Location: [facility];  Service: General;  Laterality: Right;   BREAST RECONSTRUCTION WITH PLACEMENT OF TISSUE EXPANDER AND FLEX HD (ACELLULAR HYDRATED DERMIS) Left 02/09/2018   Procedure: BREAST RECONSTRUCTION WITH PLACEMENT OF TISSUE EXPANDER AND FLEX HD (ACELLULAR HYDRATED DERMIS);  Surgeon: Wallace Going, DO;   Location: [facility];  Service: Plastics;  Laterality: Left;   BREAST SURGERY     CESAREAN SECTION  1988   x1   COLONOSCOPY WITH PROPOFOL N/A 01/23/2017   Procedure: COLONOSCOPY WITH PROPOFOL;  Surgeon: Lin Landsman, MD;  Location: [facility];  Service: Gastroenterology;  Laterality: N/A;   ESOPHAGOGASTRODUODENOSCOPY (EGD) WITH PROPOFOL N/A 01/23/2017   Procedure: ESOPHAGOGASTRODUODENOSCOPY (EGD) WITH PROPOFOL;  Surgeon: Lin Landsman, MD;  Location: [facility] END[facility]COPY;  Service: Gastroenterology;  Laterality: N/A;   LATISSIMUS FLAP TO BREAST Right 06/01/2018   Procedure: RIGHT BREAST RECONSTRUCTION WITH LATISSIMUS MYOCUTANEOUS FLAP;  Surgeon: Wallace Going, DO;  Location: [place];  Service: Plastics;  Laterality: Right;   LIPOSUCTION WITH LIPOFILLING Bilateral 10/15/2018   Procedure: LIPOSUCTION TO BILATERAL BREASTS;  Surgeon: Wallace Going, DO;  Location: [place];  Service: Plastics;  Laterality: Bilateral;   LIPOSUCTION WITH LIPOFILLING Bilateral 04/22/2019   Procedure: fat grafting/liposuction and lipofilling bilateral breasts;  Surgeon: Wallace Going, DO;  Location: [place];  Service: Plastics;  Laterality: Bilateral;   MASS EXCISION N/A 05/20/2016   Procedure: EXCISION CHEST WALL MASS;  Surgeon: Florene Glen, MD;  Location: [facility];  Service: General;  Laterality: N/A;   RECONSTRUCTION BREAST W/ LATISSIMUS DORSI FLAP Right 06/01/2018   REMOVAL OF BILATERAL TISSUE EXPANDERS WITH PLACEMENT OF BILATERAL BREAST IMPLANTS Bilateral 10/15/2018   Procedure: REMOVAL OF BILATERAL TISSUE EXPANDERS WITH PLACEMENT OF BILATERAL BREAST IMPLANTS;  Surgeon: Wallace Going, DO;  Location: [place];  Service: Plastics;  Laterality: Bilateral;   SCAR REVISION Bilateral 04/22/2019   Procedure: Excision of bilateral scar contracture;  Surgeon: Wallace Going, DO;  Location: [place];  Service: Plastics;   Laterality: Bilateral;  total case 90 min   SENTINEL NODE BIOPSY Right 06/15/2015   Procedure: SENTINEL NODE BIOPSY;  Surgeon: Hubbard Robinson, MD;  Location: [facility];  Service: General;  Laterality: Right;   TOTAL MASTECTOMY Right 05/22/2017   Procedure: TOTAL MASTECTOMY;  Surgeon: Florene Glen, MD;  Location: [facility];  Service: General;  Laterality: Right;   TOTAL MASTECTOMY Left 02/09/2018   Procedure: LEFT PROPHYLACTIC  MASTECTOMY;  Surgeon: Jovita Kussmaul, MD;  Location: [facility];  Service: General;  Laterality: Left;   TUBAL LIGATION  2004   ULNAR NERVE TRANSPOSITION Left 01/29/2019   Procedure: LEFT ULNAR NERVE DECOMPRESSION/TRANSPOSITION;  Surgeon: Leanora Cover, MD;  Location: [place];  Service: Orthopedics;  Laterality: Left;   Patient Active Problem List   Diagnosis Date Noted   Elevated blood pressure reading in office with white coat syndrome, with diagnosis of hypertension 08/08/2021   Limited joint range of motion (ROM) 07/19/2021   Class 3 severe obesity due to excess calories with body mass index (BMI) of 45.0 to 49.9 in adult ([place]) 06/25/2021   Prediabetes 06/21/2021   Elevated ferritin level 06/21/2021   OSA on CPAP 04/20/2021   Migraine headache without aura 07/21/2020   Ulnar neuropathy 08/28/2018   S/P breast reconstruction, bilateral 06/16/2018   Acquired absence of right breast 06/01/2018   S/P mastectomy, bilateral 02/17/2018   Acquired absence of left breast 02/09/2018   Acquired absence of breast and absent nipple, right 01/02/2018   Postoperative breast asymmetry 01/02/2018   Intractable nausea and vomiting 08/11/2017   Breast cancer ([place]) 05/22/2017   Morbid (severe) obesity due to excess calories ([place]) 04/15/2017   Microcytic anemia 04/15/2017   GERD (gastroesophageal reflux disease) 04/15/2017   Chest wall mass    Malignant neoplasm of upper-outer quadrant of right female breast ([place]) 05/25/2015   HTN (hypertension) 05/24/2015       REFERRING DIAG: M25.60 (ICD-10-CM) - Limited joint range of motion (ROM)   THERAPY DIAG:  Chronic right shoulder pain   Muscle weakness (generalized)   PERTINENT HISTORY: Patient is a 51 year old female with R shoulder mobility deficits and R hand weakness. Pt reports undergoing mastectomy on her R side in 2019 and undergoing lymphedema therapy. She reports difficulty with accessing overhead motion on her R arm since then. Patient reports having to re-position arm when reaching overhead - she . She reports difficulty with her hands "locking up." Patient reports this is worse in her L hand presently. Pt has Hx of L ulnar nerve transposition in 2020 and Hx of carpel tunnel syndrome on Med Hx. She reports frequently dropping items when holding onto pen or kitchen items/spices. Patient is R hand dominant. Pt is in remission for breast cancer - since her surgery/chemo in fall of 2019. Pt reports fall off her front steps 9 months ago. Patient reports no imaging at the time; mainly integumentary injuries/scrapes. Pt has intermittent episodes of dizziness -  pt describes it as "woozy" feeling and blurry vision; pt does not describe room spinning sensation. No unexplained weight loss. Pt reports intermittent disturbed sleep due to R hip pain   PRECAUTIONS: None   OBJECTIVE: (objective measures completed at initial evaluation unless otherwise dated)     DIAGNOSTIC FINDINGS:  None for R shoulder or R upper limb recently    PATIENT SURVEYS:  FOTO 62   COGNITION:           Overall cognitive status: Within functional limits for tasks assessed                                  SENSATION: WFL   POSTURE: Rounded shoulders, anteriorly tilted scapulae, increased thoracic kyphosis   UPPER EXTREMITY ROM:    Active ROM Right 08/01/2021 Left 08/01/2021  Shoulder flexion 113* 130  Shoulder extension      Shoulder abduction 84* 149  Shoulder adduction      Shoulder internal rotation      Shoulder  external rotation 78 83  Elbow flexion WNL WNL  Elbow extension -8 -4  Wrist flexion WNL WNL  Wrist extension WNL WNL  Wrist ulnar deviation WNL WNL  Wrist radial deviation WNL WNL  Wrist pronation WNL WNL  Wrist supination WNL WNL  (Blank rows = not tested)       Passive ROM Right 08/01/2021 Left 08/01/2021  Shoulder flexion 130*    Shoulder extension      Shoulder abduction 90*    Shoulder adduction      Shoulder internal rotation [facility]    Shoulder external rotation WFL    (Blank rows = not tested) *Indicates pain     UPPER EXTREMITY MMT:   MMT Right 08/03/2021 Left 08/03/2021  Shoulder flexion -4 4  Shoulder extension      Shoulder abduction 4 4+  Shoulder adduction      Shoulder internal rotation 4+* 5  Shoulder external rotation 4+ 5  Middle trapezius      Lower trapezius      Elbow flexion 4+ 5  Elbow extension 4+ 5  Wrist flexion 4    Wrist extension 5    Wrist ulnar deviation 5    Wrist radial deviation 5    Wrist pronation 5*    Wrist supination 5    Grip strength (lbs) with JAMAR dynamometer (best of 3) 53.9 lb 81.3 lb  (Blank rows = not tested)   SHOULDER SPECIAL TESTS:            Impingement tests: Neer impingement test: positive , Hawkins/Kennedy impingement test: positive , and Painful arc test: positive                 Rotator cuff assessment: Empty Can Positive, External rotation lag sign Negative   WRIST SPECIAL TESTS Tinel's sign (carpel tunnel): negative, Phalen's negative, Reverse Phalen's negative               08/06/21 ULTT's: pain with R median and ulnar nerve glide, no NT or referred pain to wrist/hand       JOINT MOBILITY TESTING:  Glenohumeral joint: Moderate restriction posterior and inferior 08/06/21 Scapulothoracic: decreased protraction and upward rotation of scapula   PALPATION:  Tenderness to palpation along R middle deltoid, R bicipital groove, R lateral pectoralis major         TREATMENT TODAY   SUBJECTIVE: Pt reports  that overhead reaching  is better. She reports intermittently dropping items with her R hand. She reports that her R arm is still sore to the touch. Patient reports intermittent difficulty with gripping c R hand - some difficulty with turning knobs. No paresthesias currently.          PAIN:  Are you having pain? 2/10 Pain location: R upper arm      Manual Therapy -    STM/DTM to right pectoralis major, primary tenderness at insertion, x6 minutes  STM to R middle deltoid; x 3 minutes Supine gentle R shoulder PROM to check ROM, x1 minute Supine R shoulder GH AP mobilizations with shoulder at varying angles, grade II-III, 30s/bout x multiple bouts; Supine R shoulder GH inferior mobilizations with shoulder at varying angles and available end range ER, grade II-III, 30s/bout x multiple bouts;          *not performed this date* Supine pectoral stretch with hands behind head and light OP at elbows, 2x30 seconds  L sidelying R scapular upward rotation mobilizations, grade III, 30s/bout x 2 bouts; Radial, ulnar and median nerve glides to test for nerve pain patient has been feeling. Discovered reproduction of symptoms with median nerve. Taught median nerve glides and performed x12 within session       Therapeutic Exercise -    Standing row BUE, GreenTB, 2x10, tactile cueing for scapular retraction/depression Standing R shoulder flexion to 90d with 3# DB 2x10; Standing R shoulder abduction to 90d with 3# DB 2x10; Seated R upper trap and levator scap stretch, x30 seconds each  Supine pectoral stretch on table; arms 90/90 position, 3x30 seconds bilateral UE Serratus slide at wall with foam roller; 2x8  Pt edu: HEP update and review (see MedBridge Access Code), added serratus slide at wall and standing row     *not today* Seated R shoulder ER with YellowTB 2 x 10; R shoulder abduction with elbow at 90d, neutral rotation with 2# DB 2x10; Serratus protraction in modified push-up position  (hands on table) 2 x 10;      PATIENT EDUCATION: Education details: Pt educated throughout session about proper posture and technique with exercises. Improved exercise technique, movement at target joints, use of target muscles after min to mod verbal, visual, tactile cues. HEP updated today.  Person educated: Patient Education method: Explanation and demonstration Education comprehension: verbalized understanding and returned demonstration     HOME EXERCISE PROGRAM: Access Code [email or web address]                   ASSESSMENT:   CLINICAL IMPRESSION: Pt demonstrates significantly improving shoulder elevation without reproduction of pain. She does have remaining tightness and TrPs along R middle deltoid and outer R pectoralis major. Decreased sensitivity to palpation noted following STM for pec major and middle deltoid today. Patient does have remaining pain, though significantly improved NPRS compared to outset of PT. Patient has made good progress overall to date. Pt will benefit from PT services to address deficits in strength, range of motion, and pain in order to return to full function at home.          OBJECTIVE IMPAIRMENTS decreased ROM, decreased strength, hypomobility, impaired flexibility, impaired UE functional use, postural dysfunction, and pain.    ACTIVITY LIMITATIONS cleaning and meal prep.    PERSONAL FACTORS 3+ comorbidities: (obesity, Hx of breast cancer and mastectomy, depression, HTN, Hx of carpel tunnel syndrome)   and time since onset of pt's condition are also affecting patient's functional outcome.  REHAB POTENTIAL: Good   CLINICAL DECISION MAKING: Evolving/moderate complexity   EVALUATION COMPLEXITY: Moderate     GOALS:     SHORT TERM GOALS: Target date: 08/23/2021   Patient will be compliant and independent with given HEP as needed to augment PT intervention for carryover of in-clinic intervention  Baseline: 08/01/21: Baseline HEP initiated (see MedBridge  Access Code) Goal status: INITIAL     LONG TERM GOALS: Target date: 09/14/2021   Patient will demonstrate improved function as evidenced by a score of 66 on FOTO measure for full participation in activities at home and in the community.   Baseline: 08/01/21: 62 Goal status: INITIAL   2.  Patient will have R shoulder flexion and abduction within 10 degrees of contralateral shoulder indicative of improved ability to elevate R upper limb as needed for functional reaching, self-care ADLs, overhead activity Baseline: 08/01/21: Shoulder AROM R/L: Flexion 113*/130, Abduction 84*/149 (*Indicates pain) Goal status: INITIAL   3.  Patient will improve R grip strength to at least 79.5 lbs reflective of at least having grip strength within standard error of contralateral upper limb (based on baseline measures) indicative of improved ability to perform prehensile tasks and hold items in R hand Baseline: 08/01/21: Grip strength (JAMAR): R 53.9, L 81.3 Goal status: INITIAL   4.  Patient will improve shoulder and elbow MMTs by at least 1/2 MMT grade indicative of improved strength as needed for ability to perform daily functional lifting tasks Baseline: 08/01/21: MMT R/L Shoulder flexion 4-/4, shoulder abduction 4/4+, shoulder ER 4+/5, shoulder IR 4+/5 Goal status: INITIAL     PLAN: PT FREQUENCY: 2x/week   PT DURATION: 6 weeks   PLANNED INTERVENTIONS: Therapeutic exercises, Therapeutic activity, Neuromuscular re-education, Patient/Family education, Joint mobilization, Cryotherapy, Moist heat, and Manual therapy   PLAN FOR NEXT SESSION: Manual therapy for glenohumeral and scapulothoracic joint mobility, STM (middle deltoid, R pectoralis major), progressive shoulder ROM without pushing repetitively into painful arc, periscapular strengthening/postural re-edu, update HEP as needed with progression of exercises/activities in PT     Valentina Gu, PT, DPT #[id number]  Eilleen Kempf, PT 09/05/2021, 1:14 PM

## 2021-09-05 NOTE — Progress Notes (Signed)
Chief Complaint:   OBESITY Michelle Mcmillan is here to discuss her progress with her obesity treatment plan along with follow-up of her obesity related diagnoses. Michelle Mcmillan is on the Category 3 Plan with breakfast options and states she is following her eating plan approximately 90% of the time. Michelle Mcmillan states she is doing weights and spin bike for 45 minutes 2 times per week.  Today's visit was #: 6 Starting weight: 333 lbs Starting date: 06/20/2021 Today's weight: 315 lbs Today's date: 09/04/2021 Total lbs lost to date: 18 lbs Total lbs lost since last in-office visit: 2 lbs  Interim History: Michelle Mcmillan is down additional 2 lbs and has done well with her plan. She has increased her water intake.   Subjective:   1. Prediabetes Michelle Mcmillan is not on medications currently.   2. Essential hypertension 08/21/2021 Michelle Mcmillan's previously blood pressure reading was 146/96. 08/08/2021 was 146/88. Her blood pressure today is elevated at 150/94.She states her blood pressure at home ranges in the 130's/70's.  Assessment/Plan:   1. Prediabetes Michelle Mcmillan will keep all carbohydrates low (starches and sugars). She will continue to work on weight loss, exercise, and decreasing simple carbohydrates to help decrease the risk of diabetes.   2. Essential hypertension Michelle Mcmillan is working on healthy weight loss and exercise to improve blood pressure control. We will refill HCTZ 12.5 mg for 3 months with no refills. We will watch for signs of hypotension as she continues her lifestyle modifications.  - hydrochlorothiazide (MICROZIDE) 12.5 MG capsule; Take 1 capsule (12.5 mg total) by mouth daily.  Dispense: 90 capsule; Refill: 0  3. Obesity, Current BMI 46.6 Michelle Mcmillan is currently in the action stage of change. As such, her goal is to continue with weight loss efforts. She has agreed to the Category 3 Plan with lunch options.   Exercise goals:  As is.   Behavioral modification strategies: increasing lean protein intake,  decreasing simple carbohydrates, increasing vegetables, increasing water intake, decreasing eating out, no skipping meals, meal planning and cooking strategies, keeping healthy foods in the home, and planning for success.  Michelle Mcmillan has agreed to follow-up with our clinic in 3 weeks. She was informed of the importance of frequent follow-up visits to maximize her success with intensive lifestyle modifications for her multiple health conditions.   Objective:   Blood pressure (!) 150/94, pulse 63, temperature 97.7 F (36.5 C), height '5\' 9"'$  (1.753 m), weight (!) 315 lb (142.9 kg), SpO2 96 %. Body mass index is 46.52 kg/m.  General: Cooperative, alert, well developed, in no acute distress. HEENT: Conjunctivae and lids unremarkable. Cardiovascular: Regular rhythm.  Lungs: Normal work of breathing. Neurologic: No focal deficits.   Lab Results  Component Value Date   CREATININE 1.07 (H) 06/20/2021   BUN 16 06/20/2021   NA 143 06/20/2021   K 4.1 06/20/2021   CL 103 06/20/2021   CO2 27 06/20/2021   Lab Results  Component Value Date   ALT 13 06/20/2021   AST 18 06/20/2021   ALKPHOS 111 06/20/2021   BILITOT <0.2 06/20/2021   Lab Results  Component Value Date   HGBA1C 6.0 (H) 06/20/2021   Lab Results  Component Value Date   INSULIN 11.8 06/20/2021   Lab Results  Component Value Date   TSH 2.020 06/20/2021   Lab Results  Component Value Date   CHOL 179 06/20/2021   HDL 65 06/20/2021   LDLCALC 105 (H) 06/20/2021   TRIG 47 06/20/2021   Lab Results  Component Value Date  VD25OH 45.2 06/20/2021   Lab Results  Component Value Date   WBC 7.9 06/02/2018   HGB 9.5 (L) 06/02/2018   HCT 36.3 06/20/2021   MCV 77.6 (L) 06/02/2018   PLT 216 06/02/2018   Lab Results  Component Value Date   IRON 47 06/20/2021   TIBC 222 (L) 06/20/2021   FERRITIN 366 (H) 06/20/2021   Attestation Statements:   Reviewed by clinician on day of visit: allergies, medications, problem list, medical  history, surgical history, family history, social history, and previous encounter notes.  I, Lizbeth Bark, RMA, am acting as Location manager for [employer], DO.  I have reviewed the above documentation for accuracy and completeness, and I agree with the above. Jearld Lesch, DO

## 2021-09-06 ENCOUNTER — Inpatient Hospital Stay: Payer: Medicare Other

## 2021-09-06 ENCOUNTER — Ambulatory Visit: Payer: Medicaid Other | Admitting: Oncology

## 2021-09-06 ENCOUNTER — Encounter: Payer: Self-pay | Admitting: Oncology

## 2021-09-06 ENCOUNTER — Encounter: Payer: Self-pay | Admitting: Nurse Practitioner

## 2021-09-06 ENCOUNTER — Other Ambulatory Visit: Payer: Self-pay

## 2021-09-06 ENCOUNTER — Inpatient Hospital Stay: Payer: Medicare Other | Attending: Oncology | Admitting: Nurse Practitioner

## 2021-09-06 VITALS — BP 126/85 | HR 59 | Temp 98.2°F | Resp 16 | Ht 69.0 in | Wt 320.0 lb

## 2021-09-06 DIAGNOSIS — G43909 Migraine, unspecified, not intractable, without status migrainosus: Secondary | ICD-10-CM | POA: Diagnosis not present

## 2021-09-06 DIAGNOSIS — L659 Nonscarring hair loss, unspecified: Secondary | ICD-10-CM | POA: Diagnosis not present

## 2021-09-06 DIAGNOSIS — M25552 Pain in left hip: Secondary | ICD-10-CM | POA: Diagnosis not present

## 2021-09-06 DIAGNOSIS — C50411 Malignant neoplasm of upper-outer quadrant of right female breast: Secondary | ICD-10-CM | POA: Diagnosis present

## 2021-09-06 DIAGNOSIS — R222 Localized swelling, mass and lump, trunk: Secondary | ICD-10-CM | POA: Insufficient documentation

## 2021-09-06 DIAGNOSIS — C50911 Malignant neoplasm of unspecified site of right female breast: Secondary | ICD-10-CM | POA: Insufficient documentation

## 2021-09-06 DIAGNOSIS — Z08 Encounter for follow-up examination after completed treatment for malignant neoplasm: Secondary | ICD-10-CM

## 2021-09-06 DIAGNOSIS — G629 Polyneuropathy, unspecified: Secondary | ICD-10-CM | POA: Insufficient documentation

## 2021-09-06 DIAGNOSIS — Z79811 Long term (current) use of aromatase inhibitors: Secondary | ICD-10-CM | POA: Diagnosis not present

## 2021-09-06 DIAGNOSIS — M25551 Pain in right hip: Secondary | ICD-10-CM | POA: Insufficient documentation

## 2021-09-06 DIAGNOSIS — Z1379 Encounter for other screening for genetic and chromosomal anomalies: Secondary | ICD-10-CM

## 2021-09-06 DIAGNOSIS — Z170 Estrogen receptor positive status [ER+]: Secondary | ICD-10-CM | POA: Insufficient documentation

## 2021-09-06 DIAGNOSIS — Z9011 Acquired absence of right breast and nipple: Secondary | ICD-10-CM | POA: Diagnosis not present

## 2021-09-06 DIAGNOSIS — Z853 Personal history of malignant neoplasm of breast: Secondary | ICD-10-CM

## 2021-09-06 DIAGNOSIS — R7989 Other specified abnormal findings of blood chemistry: Secondary | ICD-10-CM | POA: Diagnosis not present

## 2021-09-06 LAB — CBC WITH DIFFERENTIAL/PLATELET
Abs Immature Granulocytes: 0.03 10*3/uL (ref 0.00–0.07)
Basophils Absolute: 0 10*3/uL (ref 0.0–0.1)
Basophils Relative: 1 %
Eosinophils Absolute: 0.2 10*3/uL (ref 0.0–0.5)
Eosinophils Relative: 2 %
HCT: 41 % (ref 36.0–46.0)
Hemoglobin: 12.7 g/dL (ref 12.0–15.0)
Immature Granulocytes: 0 %
Lymphocytes Relative: 24 %
Lymphs Abs: 1.7 10*3/uL (ref 0.7–4.0)
MCH: 26.1 pg (ref 26.0–34.0)
MCHC: 31 g/dL (ref 30.0–36.0)
MCV: 84.4 fL (ref 80.0–100.0)
Monocytes Absolute: 0.4 10*3/uL (ref 0.1–1.0)
Monocytes Relative: 6 %
Neutro Abs: 4.8 10*3/uL (ref 1.7–7.7)
Neutrophils Relative %: 67 %
Platelets: 239 10*3/uL (ref 150–400)
RBC: 4.86 MIL/uL (ref 3.87–5.11)
RDW: 17.2 % — ABNORMAL HIGH (ref 11.5–15.5)
WBC: 7.1 10*3/uL (ref 4.0–10.5)
nRBC: 0 % (ref 0.0–0.2)

## 2021-09-06 LAB — COMPREHENSIVE METABOLIC PANEL
ALT: 27 U/L (ref 0–44)
AST: 28 U/L (ref 15–41)
Albumin: 3.8 g/dL (ref 3.5–5.0)
Alkaline Phosphatase: 95 U/L (ref 38–126)
Anion gap: 9 (ref 5–15)
BUN: 21 mg/dL — ABNORMAL HIGH (ref 6–20)
CO2: 30 mmol/L (ref 22–32)
Calcium: 9.3 mg/dL (ref 8.9–10.3)
Chloride: 103 mmol/L (ref 98–111)
Creatinine, Ser: 1.03 mg/dL — ABNORMAL HIGH (ref 0.44–1.00)
GFR, Estimated: >60 mL/min (ref >60)
Glucose, Bld: 93 mg/dL (ref 70–99)
Potassium: 4.1 mmol/L (ref 3.5–5.1)
Sodium: 142 mmol/L (ref 135–145)
Total Bilirubin: 0.4 mg/dL (ref 0.3–1.2)
Total Protein: 7.6 g/dL (ref 6.5–8.1)

## 2021-09-06 LAB — FERRITIN: Ferritin: 224 ng/mL (ref 11–307)

## 2021-09-06 LAB — IRON AND TIBC
Iron: 49 ug/dL (ref 28–170)
Saturation Ratios: 18 % (ref 10.4–31.8)
TIBC: 273 ug/dL (ref 250–450)
UIBC: 224 ug/dL

## 2021-09-06 NOTE — Progress Notes (Signed)
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   INTERVAL HISTORY: Patient is 51 year old female with history of breast cancer      She is currently going to healthy weight and wellness clinic and working on her weight loss goals.   Mrs. Crudup presents today for routine 49-monthfollow-up.  She was last seen in clinic on 12/30/2019.  In the interim, she was evaluated for a tongue lesion who was subsequently seen by ENT and lesion was removed.    REVIEW OF SYSTEMS:   Review of Systems  Constitutional:  Positive for malaise/fatigue. Negative for chills, fever and weight loss.  HENT:  Negative for congestion, ear pain and tinnitus.   Eyes: Negative.  Negative for blurred vision and double vision.  Respiratory: Negative.  Negative for cough, sputum production and shortness of breath.   Cardiovascular: Negative.  Negative for chest pain, palpitations and leg swelling.  Gastrointestinal:  Positive for nausea and vomiting. Negative for abdominal pain, constipation and diarrhea.  Genitourinary:  Negative for dysuria, frequency and urgency.  Musculoskeletal:  Positive for myalgias. Negative for back pain and falls.  Skin: Negative.  Negative for rash.  Neurological:  Positive for sensory change and headaches. Negative for weakness.  Endo/Heme/Allergies: Negative.  Does not bruise/bleed easily.  Psychiatric/Behavioral:  Negative for depression. The patient has insomnia. The  patient is not nervous/anxious.   As per HPI. Otherwise, a complete review of systems is negative.  PAST MEDICAL HISTORY: Past Medical History:  Diagnosis Date   Anemia    H/O   Anemia    Arthritis    RIGHT KNEE   Back pain    [place]   [place]    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)   Carpal tunnel syndrome    Constipation    Depression    Dyspnea    GERD (gastroesophageal reflux disease)    NO MEDS   Headache    migraines   Hypertension    Joint pain    Last menstrual period (LMP) > 10 days ago 08/2015   Lower extremity edema    Migraines    Neuropathy    OSA (obstructive sleep apnea)    Personal history of radiation therapy    Prediabetes    SOB (shortness of breath)     PAST SURGICAL HISTORY: Past Surgical History:  Procedure Laterality Date   BREAST BIOPSY Right 05/08/2017   invasive mamm. ca   BREAST EXCISIONAL BIOPSY Right 06/15/15   breast cancer   BREAST LUMPECTOMY WITH NEEDLE LOCALIZATION Right 06/15/2015   Procedure: BREAST LUMPECTOMY WITH NEEDLE LOCALIZATION;  Surgeon: CHubbard Robinson MD;  Location: [facility];  Service: General;  Laterality: Right;   BREAST RECONSTRUCTION WITH PLACEMENT OF TISSUE EXPANDER AND FLEX HD (ACELLULAR HYDRATED DERMIS) Left 02/09/2018   Procedure: BREAST RECONSTRUCTION WITH PLACEMENT OF TISSUE EXPANDER AND FLEX HD (ACELLULAR HYDRATED DERMIS);  Surgeon: DWallace Going DO;  Location: [facility];  Service: Plastics;  Laterality: Left;   BREAST SURGERY  CESAREAN SECTION  1988   x1   COLONOSCOPY WITH PROPOFOL N/A 01/23/2017   Procedure: COLONOSCOPY WITH PROPOFOL;  Surgeon: Lin Landsman, MD;  Location[facility];  Service: Gastroenterology;  Laterality: N/A;   ESOPHAGOGASTRODUODENOSCOPY (EGD) WITH PROPOFOL N/A 01/23/2017   Procedure: ESOPHAGOGASTRODUODENOSCOPY (EGD) WITH PROPOFOL;  Surgeon: Lin Landsman, MD;  Location: [facility] ENDOSCOPY;  Service: Gastroenterology;  Laterality: N/A;    LATISSIMUS FLAP TO BREAST Right 06/01/2018   Procedure: RIGHT BREAST RECONSTRUCTION WITH LATISSIMUS MYOCUTANEOUS FLAP;  Surgeon: Wallace Going, DO;  Location: [place];  Service: Plastics;  Laterality: Right;   LIPOSUCTION WITH LIPOFILLING Bilateral 10/15/2018   Procedure: LIPOSUCTION TO BILATERAL BREASTS;  Surgeon: Wallace Going, DO;  Location: [place];  Service: Plastics;  Laterality: Bilateral;   LIPOSUCTION WITH LIPOFILLING Bilateral 04/22/2019   Procedure: fat grafting/liposuction and lipofilling bilateral breasts;  Surgeon: Wallace Going, DO;  Location: [place];  Service: Plastics;  Laterality: Bilateral;   MASS EXCISION N/A 05/20/2016   Procedure: EXCISION CHEST WALL MASS;  Surgeon: Florene Glen, MD;  Location: [facility];  Service: General;  Laterality: N/A;   RECONSTRUCTION BREAST W/ LATISSIMUS DORSI FLAP Right 06/01/2018   REMOVAL OF BILATERAL TISSUE EXPANDERS WITH PLACEMENT OF BILATERAL BREAST IMPLANTS Bilateral 10/15/2018   Procedure: REMOVAL OF BILATERAL TISSUE EXPANDERS WITH PLACEMENT OF BILATERAL BREAST IMPLANTS;  Surgeon: Wallace Going, DO;  Location: [place];  Service: Plastics;  Laterality: Bilateral;   SCAR REVISION Bilateral 04/22/2019   Procedure: Excision of bilateral scar contracture;  Surgeon: Wallace Going, DO;  Location: [place];  Service: Plastics;  Laterality: Bilateral;  total case 90 min   SENTINEL NODE BIOPSY Right 06/15/2015   Procedure: SENTINEL NODE BIOPSY;  Surgeon: Hubbard Robinson, MD;  Location: [facility];  Service: General;  Laterality: Right;   TOTAL MASTECTOMY Right 05/22/2017   Procedure: TOTAL MASTECTOMY;  Surgeon: Florene Glen, MD;  Location: [facility];  Service: General;  Laterality: Right;   TOTAL MASTECTOMY Left 02/09/2018   Procedure: LEFT PROPHYLACTIC  MASTECTOMY;  Surgeon: Jovita Kussmaul, MD;  Location: [facility];  Service: General;  Laterality:  Left;   TUBAL LIGATION  2004   ULNAR NERVE TRANSPOSITION Left 01/29/2019   Procedure: LEFT ULNAR NERVE DECOMPRESSION/TRANSPOSITION;  Surgeon: Leanora Cover, MD;  Location: [place];  Service: Orthopedics;  Laterality: Left;    FAMILY HISTORY Family History  Problem Relation Age of Onset   Hypertension Mother    Obesity Mother    Hypertension Father    Diabetes Father    Hypertension Sister    Breast cancer Neg Hx       ADVANCED DIRECTIVES:    HEALTH MAINTENANCE: Social History   Tobacco Use   Smoking status: Never   Smokeless tobacco: Never  Vaping Use   Vaping Use: Never used  Substance Use Topics   Alcohol use: Not Currently   Drug use: No    Allergies  Allergen Reactions   Nitroglycerin Other (See Comments)    Headache, n/v.    Current Outpatient Medications  Medication Sig Dispense Refill   APPLE CIDER VINEGAR PO Take by mouth. Two tabs daily     buPROPion (WELLBUTRIN XL) 150 MG 24 hr tablet Take 150 mg by mouth daily.     butalbital-aspirin-caffeine (FIORINAL) 50-325-40 MG capsule Take 1 capsule by mouth as needed for headache.     calcium-vitamin D (OSCAL WITH D) 500-200 MG-UNIT TABS  tablet Take 1 tablet by mouth 2 (two) times daily. With meals     diclofenac sodium (VOLTAREN) 1 % GEL Apply 4 g topically 4 (four) times daily.     dorzolamide-timolol (COSOPT) 22.3-6.8 MG/ML ophthalmic solution Place 1 drop into both eyes every morning.     ELDERBERRY PO Take 50 mg by mouth daily.     Ferrous Sulfate (IRON) 325 (65 Fe) MG TABS Take 1 tablet (325 mg total) by mouth 2 (two) times daily. 60 each 2   gabapentin (NEURONTIN) 300 MG capsule TAKE 1 CAPSULE BY MOUTH 3  TIMES DAILY AND MAY TAKE 2  CAPSULES BY MOUTH AT  BEDTIME IF MORE BOTHERSOME  AT NIGHT 450 capsule 0   ibuprofen (ADVIL) 800 MG tablet TAKE 1 TABLET BY MOUTH EVERY 8 HOURS WITH FOOD AS NEEDED FOR PAIN     latanoprost (XALATAN) 0.005 % ophthalmic solution Place 1 drop into both eyes at  bedtime.     letrozole (FEMARA) 2.5 MG tablet TAKE 1 TABLET BY MOUTH  DAILY 90 tablet 3   Melatonin 5 MG CAPS Take 5 mg by mouth at bedtime.     omeprazole (PRILOSEC) 20 MG capsule Take 1 capsule (20 mg total) by mouth daily. 30 capsule 3   Polyethyl Glycol-Propyl Glycol 0.4-0.3 % SOLN Apply to eye.     rizatriptan (MAXALT) 10 MG tablet SMARTSIG:1 Tablet(s) By Mouth 1 to 2 Times Daily     zinc gluconate 50 MG tablet Take 50 mg by mouth daily.     hydrochlorothiazide (MICROZIDE) 12.5 MG capsule Take 1 capsule (12.5 mg total) by mouth daily. (Patient not taking: Reported on 09/06/2021) 90 capsule 0   No current facility-administered medications for this visit.    OBJECTIVE: Vitals:   09/06/21 1110  BP: 126/85  Pulse: (!) 59  Resp: 16  Temp: 98.2 F (36.8 C)     Body mass index is 42.91 kg/m.    ECOG FS:0 - Asymptomatic  General: Well-developed, well-nourished, no acute distress. HEENT: Normocephalic. Neuro: Alert, answering all questions appropriately. Cranial nerves grossly intact. Psych: Normal affect.  LAB RESULTS:  Lab Results  Component Value Date   NA 143 06/20/2021   K 4.1 06/20/2021   CL 103 06/20/2021   CO2 27 06/20/2021   GLUCOSE 94 06/20/2021   BUN 16 06/20/2021   CREATININE 1.07 (H) 06/20/2021   CALCIUM 9.0 06/20/2021   PROT 6.3 06/20/2021   ALBUMIN 3.7 (L) 06/20/2021   AST 18 06/20/2021   ALT 13 06/20/2021   ALKPHOS 111 06/20/2021   BILITOT <0.2 06/20/2021   GFRNONAA >60 01/25/2019   GFRAA >60 01/25/2019    Lab Results  Component Value Date   WBC 7.9 06/02/2018   NEUTROABS 3.7 10/21/2017   HGB 9.5 (L) 06/02/2018   HCT 36.3 06/20/2021   MCV 77.6 (L) 06/02/2018   PLT 216 06/02/2018     STUDIES: DG Bone Density  Result Date: 09/03/2021 EXAM: DUAL X-RAY ABSORPTIOMETRY (DXA) FOR BONE MINERAL DENSITY IMPRESSION: Your patient Lilliann Rossetti completed a BMD test on 09/03/2021 using the [employer] (software version: 14.10) manufactured by [employer]. The following summarizes the results of our evaluation. Technologist: [facility] PATIENT BIOGRAPHICAL: Name: Maleny, Candy Patient ID: [id number] Birth Date: [birth year] Height: 69.0 in. Gender: Female Exam Date: 09/03/2021 Weight: 319.0 lbs. Indications: History of Radiation, High Risk Meds, Postmenopausal, History of Chemo, History of Breast Cancer Fractures: Treatments: Calcium, Femara, Gabapentin, Vitamin D DENSITOMETRY RESULTS: Site  Region     Measured Date Measured Age WHO Classification Young Adult T-score BMD         %Change vs. Previous Significant Change (*) AP Spine L1-L4 09/03/2021 51.8 Normal 0.7 1.282 g/cm2 0.0% - AP Spine L1-L4 12/01/2019 50.0 Normal 0.7 1.282 g/cm2 -14.5% Yes AP Spine L1-L4 09/04/2016 46.8 Normal 2.5 1.499 g/cm2 - - DualFemur Neck Left 09/03/2021 51.8 Normal 0.9 1.158 g/cm2 0.0% - DualFemur Neck Left 12/01/2019 50.0 Normal 0.9 1.158 g/cm2 -6.3% Yes DualFemur Neck Left 11/16/2018 49.0 Normal 1.4 1.236 g/cm2 -0.3% - DualFemur Neck Left 11/12/2017 48.0 Normal 1.5 1.240 g/cm2 0.3% - DualFemur Neck Left 09/04/2016 46.8 Normal 1.4 1.236 g/cm2 - - DualFemur Total Mean 09/03/2021 51.8 Normal 1.5 1.192 g/cm2 -3.2% Yes DualFemur Total Mean 12/01/2019 50.0 Normal 1.8 1.231 g/cm2 -1.9% Yes DualFemur Total Mean 11/16/2018 49.0 Normal 2.0 1.255 g/cm2 -2.4% Yes DualFemur Total Mean 11/12/2017 48.0 Normal 2.2 1.286 g/cm2 1.5% Yes DualFemur Total Mean 09/04/2016 46.8 Normal 2.1 1.267 g/cm2 - - ASSESSMENT: The BMD measured at AP Spine L1-L4 is 1.282 g/cm2 with a T-score of 0.7. This patient is considered normal according to [facility]) criteria. The scan quality is good. Compared with prior study, there has been no significant change in the spine. Compared with prior study, there has been ignificant decrease in the total hip. [facility]) criteria for post-menopausal, Caucasian Women: Normal:                   T-score at or above -1 SD  Osteopenia/low bone mass: T-score between -1 and -2.5 SD Osteoporosis:             T-score at or below -2.5 SD RECOMMENDATIONS: 1. All patients should optimize calcium and vitamin D intake. 2. Consider FDA-approved medical therapies in postmenopausal women and men aged 67 years and older, based on the following: a. A hip or vertebral(clinical or morphometric) fracture b. T-score < -2.5 at the femoral neck or spine after appropriate evaluation to exclude secondary causes c. Low bone mass (T-score between -1.0 and -2.5 at the femoral neck or spine) and a 10-year probability of a hip fracture > 3% or a 10-year probability of a major osteoporosis-related fracture > 20% based on the [place]-adapted WHO algorithm 3. Clinician judgment and/or patient preferences may indicate treatment for people with 10-year fracture probabilities above or below these levels FOLLOW-UP: People with diagnosed cases of osteoporosis or at high risk for fracture should have regular bone mineral density tests. For patients eligible for Medicare, routine testing is allowed once every 2 years. The testing frequency can be increased to one year for patients who have rapidly progressing disease, those who are receiving or discontinuing medical therapy to restore bone mass, or have additional risk factors. I have reviewed this report, and agree with the above findings. [facility] Electronically Signed   By: Ammie Ferrier M.D.   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 BRCA 1 and 2 negative, Oncotype DX score 17 which is considered low risk.  PLAN:    1. 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 Low risk oncotype. Underwent lumpectomy followed by aromasin. Developed  second malignancy (see below)  2. Er/PR positive- HER-2 negative adenocarcinoma  of the right breast- she had a suspicious mammogram on 05/06/17 which showed suspicious right breast mass. Biopsy revealed invasive mammary carcinoma, grade 2, ER/PR+, HER2-. Underwent right breast mastectomy in February 2019. Oncotype was not sent on second malignancy but this was confirmed to be second primary. She completed 5 cycles of taxotere cytoxan on 09/16/17.   3. Breast cancer surveillance-  she underwent prophylactic left mastectomy 01/2018 with reconstruction bilaterally. She no longer requires screening mammograms. Recommend 6 month surveillance visits and continued aromatase inhibitor therapy for at least 5 years/through June 2024. Could consider extended adjuvant treatment or BCI testing as well.   4. Genetic Testing- patient had BRACAnalysis with [facility] in 2017. I reviewed her results and copy scanned into her chart. Results were negative. We reviewed that since her diagnosis, she would be eligible for broader genetic testing which I would recommend given her personal and family history. Reviewed with Faith Rogue who confirms. Patient agrees and referral sent to genetics for re-screen.   5. Elevated ferritin- new problem. patient reports elevated ferritin during recent screening. Labs today: cbc, cmp, ferritin, iron studies. Labs reviewed and ferritin and iron studies have normalized. No evidence of anemia or iron overload. Monitor.   6. Bilateral hip pain- new. Given her history of malignancy, I recommend ct to evaluate for recurrent disease. If negative, management based on findings.   7. Chest wall mass/pain- CT from 01/19/2019 showed nonspecific 2.5 cm chest wall mass. Not hypermetabolic on PET. She was referred to lymphedema clinic. Improved but warrants re-evaluation with imaging.   8. Bone Health- DEXA scan continues to be normal. Recommend calcium and vitamin d along with weight bearing exercise as  tolerated. In setting of aromatase inhibitor use, recommend dexa scan in 1 year.   9.  Neuropathy: Stable.   10. Hot flashes- she does not complain of this today.   11. 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 Continue otc treatments or can consider referral to dermatology.   12.  Migraine headaches: followed by neuro-oncology, Dr. Mickeal Skinner.   Disposition: Ct c/a/p Add on labs today (cbc, cmp, ferritin, iron studies) Obtain genetic testing results 6 mo- see finn for breast surveillance- [place]  Patient expressed understanding and was in agreement with this plan. She also understands that She can call clinic at any time with any questions, concerns, or complaints.    Verlon Au, NP 09/06/21    CC: Dr. Grayland Ormond

## 2021-09-06 NOTE — Progress Notes (Signed)
She on a wt loss program and part of that is to have a psych doctor to be on board and she was seeing the person but the insurance she has is not in network and she pays for it out of pocket and she is not doing it this summer due to cost, she will start back after summer

## 2021-09-10 ENCOUNTER — Encounter (INDEPENDENT_AMBULATORY_CARE_PROVIDER_SITE_OTHER): Payer: Self-pay | Admitting: Bariatrics

## 2021-09-10 ENCOUNTER — Ambulatory Visit: Payer: Medicare Other | Admitting: Physical Therapy

## 2021-09-10 DIAGNOSIS — M25611 Stiffness of right shoulder, not elsewhere classified: Secondary | ICD-10-CM

## 2021-09-10 DIAGNOSIS — G8929 Other chronic pain: Secondary | ICD-10-CM

## 2021-09-10 DIAGNOSIS — M6281 Muscle weakness (generalized): Secondary | ICD-10-CM

## 2021-09-10 DIAGNOSIS — M25511 Pain in right shoulder: Secondary | ICD-10-CM | POA: Diagnosis not present

## 2021-09-10 NOTE — Therapy (Unsigned)
OUTPATIENT PHYSICAL THERAPY TREATMENT AND PROGRESS NOTE   Dates of reporting period  08/01/2021   to   6/12/023    Patient Name: Michelle Mcmillan MRN: [id number] DOB:[birth year], 51 y.o., female Today's Date: 09/12/2021  PCP: Donnie Coffin; MD REFERRING PROVIDER: Esaw Grandchild, NP  END OF SESSION:   PT End of Session - 09/12/21 0541     Visit Number 10    Number of Visits 13    Date for PT Re-Evaluation 09/13/21    Authorization Type UHC Medicare/Medicaid, VL based on medical necessity    Progress Note Due on Visit 10    PT Start Time 0931    PT Stop Time 1015    PT Time Calculation (min) 44 min    Activity Tolerance Patient tolerated treatment well    Behavior During Therapy [facility] for tasks assessed/performed             Past Medical History:  Diagnosis Date   Anemia    H/O   Anemia    Arthritis    RIGHT KNEE   Back pain    Breast cancer ([place]) 2017   right breast   Cancer ([place])    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)   Carpal tunnel syndrome    Constipation    Depression    Dyspnea    GERD (gastroesophageal reflux disease)    NO MEDS   Headache    migraines   Hypertension    Joint pain    Last menstrual period (LMP) > 10 days ago 08/2015   Lower extremity edema    Migraines    Neuropathy    OSA (obstructive sleep apnea)    Personal history of radiation therapy    Prediabetes    SOB (shortness of breath)    Past Surgical History:  Procedure Laterality Date   BREAST BIOPSY Right 05/08/2017   invasive mamm. ca   BREAST EXCISIONAL BIOPSY Right 06/15/15   breast cancer   BREAST LUMPECTOMY WITH NEEDLE LOCALIZATION Right 06/15/2015   Procedure: BREAST LUMPECTOMY WITH NEEDLE LOCALIZATION;  Surgeon: Hubbard Robinson, MD;  Location: [facility];  Service: General;  Laterality: Right;   BREAST RECONSTRUCTION WITH PLACEMENT OF TISSUE EXPANDER AND FLEX HD (ACELLULAR HYDRATED DERMIS) Left 02/09/2018   Procedure: BREAST RECONSTRUCTION WITH PLACEMENT OF TISSUE EXPANDER AND FLEX  HD (ACELLULAR HYDRATED DERMIS);  Surgeon: Wallace Going, DO;  Location: [facility];  Service: Plastics;  Laterality: Left;   BREAST SURGERY     CESAREAN SECTION  1988   x1   COLONOSCOPY WITH PROPOFOL N/A 01/23/2017   Procedure: COLONOSCOPY WITH PROPOFOL;  Surgeon: Lin Landsman, MD;  Location: [facility] ENDOSCOPY;  Service: Gastroenterology;  Laterality: N/A;   ESOPHAGOGASTRODUODENOSCOPY (EGD) WITH PROPOFOL N/A 01/23/2017   Procedure: ESOPHAGOGASTRODUODENOSCOPY (EGD) WITH PROPOFOL;  Surgeon: Lin Landsman, MD;  Location: [facility] ENDOSCOPY;  Service: Gastroenterology;  Laterality: N/A;   LATISSIMUS FLAP TO BREAST Right 06/01/2018   Procedure: RIGHT BREAST RECONSTRUCTION WITH LATISSIMUS MYOCUTANEOUS FLAP;  Surgeon: Wallace Going, DO;  Location: [place];  Service: Plastics;  Laterality: Right;   LIPOSUCTION WITH LIPOFILLING Bilateral 10/15/2018   Procedure: LIPOSUCTION TO BILATERAL BREASTS;  Surgeon: Wallace Going, DO;  Location: [place];  Service: Plastics;  Laterality: Bilateral;   LIPOSUCTION WITH LIPOFILLING Bilateral 04/22/2019   Procedure: fat grafting/liposuction and lipofilling bilateral breasts;  Surgeon: Wallace Going, DO;  Location: [place];  Service: Plastics;  Laterality: Bilateral;   MASS EXCISION N/A 05/20/2016  Procedure: EXCISION CHEST WALL MASS;  Surgeon: Florene Glen, MD;  Location: [facility];  Service: General;  Laterality: N/A;   RECONSTRUCTION BREAST W/ LATISSIMUS DORSI FLAP Right 06/01/2018   REMOVAL OF BILATERAL TISSUE EXPANDERS WITH PLACEMENT OF BILATERAL BREAST IMPLANTS Bilateral 10/15/2018   Procedure: REMOVAL OF BILATERAL TISSUE EXPANDERS WITH PLACEMENT OF BILATERAL BREAST IMPLANTS;  Surgeon: Wallace Going, DO;  Location: [place];  Service: Plastics;  Laterality: Bilateral;   SCAR REVISION Bilateral 04/22/2019   Procedure: Excision of bilateral scar contracture;  Surgeon: Wallace Going, DO;  Location: [place];  Service: Plastics;  Laterality: Bilateral;  total case 90 min   SENTINEL NODE BIOPSY Right 06/15/2015   Procedure: SENTINEL NODE BIOPSY;  Surgeon: Hubbard Robinson, MD;  Location: [facility];  Service: General;  Laterality: Right;   TOTAL MASTECTOMY Right 05/22/2017   Procedure: TOTAL MASTECTOMY;  Surgeon: Florene Glen, MD;  Location: [facility];  Service: General;  Laterality: Right;   TOTAL MASTECTOMY Left 02/09/2018   Procedure: LEFT PROPHYLACTIC  MASTECTOMY;  Surgeon: Jovita Kussmaul, MD;  Location: [facility];  Service: General;  Laterality: Left;   TUBAL LIGATION  2004   ULNAR NERVE TRANSPOSITION Left 01/29/2019   Procedure: LEFT ULNAR NERVE DECOMPRESSION/TRANSPOSITION;  Surgeon: Leanora Cover, MD;  Location: [place];  Service: Orthopedics;  Laterality: Left;   Patient Active Problem List   Diagnosis Date Noted   Elevated blood pressure reading in office with white coat syndrome, with diagnosis of hypertension 08/08/2021   Limited joint range of motion (ROM) 07/19/2021   Class 3 severe obesity due to excess calories with body mass index (BMI) of 45.0 to 49.9 in adult ([place]) 06/25/2021   Prediabetes 06/21/2021   Elevated ferritin level 06/21/2021   OSA on CPAP 04/20/2021   Migraine headache without aura 07/21/2020   Ulnar neuropathy 08/28/2018   S/P breast reconstruction, bilateral 06/16/2018   Acquired absence of right breast 06/01/2018   S/P mastectomy, bilateral 02/17/2018   Acquired absence of left breast 02/09/2018   Acquired absence of breast and absent nipple, right 01/02/2018   Postoperative breast asymmetry 01/02/2018   Intractable nausea and vomiting 08/11/2017   Breast cancer ([place]) 05/22/2017   Morbid (severe) obesity due to excess calories ([place]) 04/15/2017   Microcytic anemia 04/15/2017   GERD (gastroesophageal reflux disease) 04/15/2017   Chest wall mass    Malignant neoplasm of upper-outer quadrant  of right female breast ([place]) 05/25/2015   HTN (hypertension) 05/24/2015    REFERRING DIAG: M25.60 (ICD-10-CM) - Limited joint range of motion (ROM)   THERAPY DIAG:  Chronic right shoulder pain   Muscle weakness (generalized)   PERTINENT HISTORY: Patient is a 51 year old female with R shoulder mobility deficits and R hand weakness. Pt reports undergoing mastectomy on her R side in 2019 and undergoing lymphedema therapy. She reports difficulty with accessing overhead motion on her R arm since then. Patient reports having to re-position arm when reaching overhead - she . She reports difficulty with her hands "locking up." Patient reports this is worse in her L hand presently. Pt has Hx of L ulnar nerve transposition in 2020 and Hx of carpel tunnel syndrome on Med Hx. She reports frequently dropping items when holding onto pen or kitchen items/spices. Patient is R hand dominant. Pt is in remission for breast cancer - since her surgery/chemo in fall of 2019. Pt reports fall off her front steps 9 months ago. Patient reports no  imaging at the time; mainly integumentary injuries/scrapes. Pt has intermittent episodes of dizziness - pt describes it as "woozy" feeling and blurry vision; pt does not describe room spinning sensation. No unexplained weight loss. Pt reports intermittent disturbed sleep due to R hip pain   PRECAUTIONS: None   OBJECTIVE: (objective measures completed at initial evaluation unless otherwise dated)     DIAGNOSTIC FINDINGS:  None for R shoulder or R upper limb recently    PATIENT SURVEYS:  FOTO 62   COGNITION:           Overall cognitive status: Within functional limits for tasks assessed                                  SENSATION: WFL   POSTURE: Rounded shoulders, anteriorly tilted scapulae, increased thoracic kyphosis   UPPER EXTREMITY ROM:    Active ROM Right 08/01/2021 Left 08/01/2021 Right 09/10/21 Left 09/10/21  Shoulder flexion 113* 130 146 136  Shoulder  extension        Shoulder abduction 84* 149 153 164  Shoulder adduction        Shoulder internal rotation        Shoulder external rotation 78 83 78 84  Elbow flexion WNL WNL    Elbow extension -8 -4 -2 -3  Wrist flexion WNL WNL    Wrist extension WNL WNL    Wrist ulnar deviation WNL WNL    Wrist radial deviation WNL WNL    Wrist pronation WNL WNL    Wrist supination WNL WNL    (Blank rows = not tested)       Passive ROM Right 08/01/2021 Left 08/01/2021 Right 09/10/21  Shoulder flexion 130*   165  Shoulder extension       Shoulder abduction 90*   152  Shoulder adduction       Shoulder internal rotation [facility]     Shoulder external rotation WFL     (Blank rows = not tested) *Indicates pain     UPPER EXTREMITY MMT:   MMT Right 08/03/2021 Left 08/03/2021 Right 09/10/21 Left 09/10/21  Shoulder flexion -4 4 4+   Shoulder extension        Shoulder abduction 4 4+ 4+   Shoulder adduction        Shoulder internal rotation 4+* 5 5   Shoulder external rotation 4+ 5 4+   Middle trapezius        Lower trapezius        Elbow flexion 4+ 5 5   Elbow extension 4+ 5 5   Wrist flexion 4   5   Wrist extension 5   5   Wrist ulnar deviation 5   5   Wrist radial deviation 5   5   Wrist pronation 5*   5   Wrist supination 5   5   Grip strength (lbs) with JAMAR dynamometer (best of 3) 53.9 lb 81.3 lb 83.0 lb 82.3 lb  (Blank rows = not tested)   SHOULDER SPECIAL TESTS:            Impingement tests: Neer impingement test: positive , Hawkins/Kennedy impingement test: positive , and Painful arc test: positive                 Rotator cuff assessment: Empty Can Positive, External rotation lag sign Negative   WRIST SPECIAL TESTS Tinel's sign (carpel tunnel): negative, Phalen's negative, Reverse Phalen's negative  08/06/21 ULTT's: pain with R median and ulnar nerve glide, no NT or referred pain to wrist/hand       JOINT MOBILITY TESTING:  Glenohumeral joint: Moderate restriction  posterior and inferior 08/06/21 Scapulothoracic: decreased protraction and upward rotation of scapula   PALPATION:  Tenderness to palpation along R middle deltoid, R bicipital groove, R lateral pectoralis major         TREATMENT TODAY   SUBJECTIVE: Pt reports minimal shoulder pain this AM. No significant soreness after last visit. Pt is compliant with HEP. Pt reports 85-90%  SANE score presently. Pt reports doing well with reaching for shower curtain. She reports doing well with household functional reaching. Pt reports she would like lesser shoulder soreness/discomfort near GHJ.          PAIN:  Are you having pain? No Pain location: -      Manual Therapy -    STM to R middle deltoid; x 3 minutes Supine gentle R shoulder PROM to check ROM, x1 minute Supine R shoulder GH AP mobilizations with shoulder at varying angles, grade II-III, 30s/bout x multiple bouts; Supine R shoulder GH inferior mobilizations with shoulder at varying angles and available end range ER, grade II-III, 30s/bout x multiple bouts;           *not performed this date* STM/DTM to right pectoralis major, primary tenderness at insertion  Supine pectoral stretch with hands behind head and light OP at elbows, 2x30 seconds  L sidelying R scapular upward rotation mobilizations, grade III, 30s/bout x 2 bouts; Radial, ulnar and median nerve glides to test for nerve pain patient has been feeling. Discovered reproduction of symptoms with median nerve. Taught median nerve glides and performed x12 within session       Therapeutic Exercise -   *Re-assessment and goal update   Bilateral shoulder ER with scapular retraction; Green Tband; 2x10  Standing doorway pectoral stretch, 3x30 seconds bilateral UE    Pt edu: Discussed current progress c PT, prognosis, continued plan of care and anticipated discharge    *next visit* Serratus slide at wall with foam roller; 2x8 Standing R shoulder flexion to 90d with 3# DB  2x10; Standing R shoulder abduction to 90d with 3# DB 2x10;    *not today* Seated R upper trap and levator scap stretch, x30 seconds each  Standing row BUE, GreenTB, 2x10, tactile cueing for scapular retraction/depression Seated R shoulder ER with YellowTB 2 x 10; R shoulder abduction with elbow at 90d, neutral rotation with 2# DB 2x10; Serratus protraction in modified push-up position (hands on table) 2 x 10;       PATIENT EDUCATION: Education details: Pt educated throughout session about proper posture and technique with exercises. Improved exercise technique, movement at target joints, use of target muscles after min to mod verbal, visual, tactile cues.  Person educated: Patient Education method: Explanation and demonstration Education comprehension: verbalized understanding and returned demonstration     HOME EXERCISE PROGRAM: Access Code [email or web address]                   ASSESSMENT:   CLINICAL IMPRESSION: Patient demonstrates markedly improved R shoulder AROM for shoulder elevation and improved MMTs in all planes of motion with only R shoulder external rotator strength remaining unchanged. She has significant improvement in pain status and has improved with overhead activity/reaching tasks. Pt has surpassed predicted FOTO score. She has R hand grip strength consistent with age and gender-based norm. She has minimal pain in R shoulder  today and is making good progress. Decreasing sensitivity along pec major noted. Pt will benefit from continued skilled PT services to address deficits remaining in strength, range of motion, and residual intermittent pain in order to return to full function at home with anticipated D/C in next 2-3 weeks.          OBJECTIVE IMPAIRMENTS decreased ROM, decreased strength, hypomobility, impaired flexibility, impaired UE functional use, postural dysfunction, and pain.    ACTIVITY LIMITATIONS cleaning and meal prep.    PERSONAL FACTORS 3+ comorbidities:  (obesity, Hx of breast cancer and mastectomy, depression, HTN, Hx of carpel tunnel syndrome)   and time since onset of pt's condition are also affecting patient's functional outcome.      REHAB POTENTIAL: Good   CLINICAL DECISION MAKING: Evolving/moderate complexity   EVALUATION COMPLEXITY: Moderate     GOALS:     SHORT TERM GOALS: Target date: 08/23/2021   Patient will be compliant and independent with given HEP as needed to augment PT intervention for carryover of in-clinic intervention  Baseline: 08/01/21: Baseline HEP initiated (see [place]).   09/10/21: Patient is compliant with progressed HEP  Goal status: ACHIEVED     LONG TERM GOALS: Target date: 09/14/2021   Patient will demonstrate improved function as evidenced by a score of 66 on FOTO measure for full participation in activities at home and in the community.   Baseline: 08/01/21: 62.  09/10/21: 67 Goal status: ACHIEVED   2.  Patient will have R shoulder flexion and abduction within 10 degrees of contralateral shoulder indicative of improved ability to elevate R upper limb as needed for functional reaching, self-care ADLs, overhead activity Baseline: 08/01/21: Shoulder AROM R/L: Flexion 113*/130, Abduction 84*/149 (*Indicates pain).    09/10/21: Shoulder AROM R/L Flexion 146/136, Abduction 153/164.  Goal status: IN PROGRESS/MOSTLY MET   3.  Patient will improve R grip strength to at least 79.5 lbs reflective of at least having grip strength within standard error of contralateral upper limb (based on baseline measures) indicative of improved ability to perform prehensile tasks and hold items in R hand Baseline: 08/01/21: Grip strength (JAMAR): R 53.9, L 81.3.   09/10/21: Grip strength (JAMAR): R 83.0, L 82.3.  Goal status: ACHIEVED   4.  Patient will improve shoulder and elbow MMTs by at least 1/2 MMT grade indicative of improved strength as needed for ability to perform daily functional lifting tasks Baseline: 08/01/21:  MMT R/L Shoulder flexion 4-/4, shoulder abduction 4/4+, shoulder ER 4+/5, shoulder IR 4+/5.  09/10/21: Met for all with exception of shoulder ER manual muscle test unchanged.  Goal status: IN PROGRESS/MOSTLY MET     PLAN: PT FREQUENCY: 2x/week   PT DURATION: 6 weeks   PLANNED INTERVENTIONS: Therapeutic exercises, Therapeutic activity, Neuromuscular re-education, Patient/Family education, Joint mobilization, Cryotherapy, Moist heat, and Manual therapy   PLAN FOR NEXT SESSION: Progress with strengthening, scapular mobility and serratus anterior activation for overhead mobility, postural re-education. Recommend continued PT 2x/week for 2-3 weeks with anticipated discharge following that time.        Valentina Gu, PT, DPT #[id number]  Eilleen Kempf, PT 09/12/2021, 5:41 AM

## 2021-09-11 ENCOUNTER — Encounter: Payer: Self-pay | Admitting: Physical Therapy

## 2021-09-12 ENCOUNTER — Ambulatory Visit: Payer: Medicare Other | Admitting: Physical Therapy

## 2021-09-13 ENCOUNTER — Encounter: Payer: Self-pay | Admitting: Physical Therapy

## 2021-09-13 ENCOUNTER — Ambulatory Visit: Payer: Medicare Other | Admitting: Physical Therapy

## 2021-09-13 DIAGNOSIS — M25511 Pain in right shoulder: Secondary | ICD-10-CM | POA: Diagnosis not present

## 2021-09-13 DIAGNOSIS — G8929 Other chronic pain: Secondary | ICD-10-CM

## 2021-09-13 DIAGNOSIS — M25611 Stiffness of right shoulder, not elsewhere classified: Secondary | ICD-10-CM

## 2021-09-13 DIAGNOSIS — M6281 Muscle weakness (generalized): Secondary | ICD-10-CM

## 2021-09-13 NOTE — Therapy (Unsigned)
OUTPATIENT PHYSICAL THERAPY TREATMENT NOTE   Patient Name: Michelle Mcmillan MRN: [id number] DOB:1970/08/17, 51 y.o., female Today's Date: 09/13/2021  PCP: Donnie Coffin, MD REFERRING PROVIDER: Esaw Grandchild, NP  END OF SESSION:   PT End of Session - 09/13/21 1304     Visit Number 11    Number of Visits 13    Date for PT Re-Evaluation 09/13/21    Authorization Type UHC Medicare/Medicaid, VL based on medical necessity    Progress Note Due on Visit 10    PT Start Time 1301    PT Stop Time 1345    PT Time Calculation (min) 44 min    Activity Tolerance Patient tolerated treatment well    Behavior During Therapy [facility] for tasks assessed/performed             Past Medical History:  Diagnosis Date   Anemia    H/O   Anemia    Arthritis    RIGHT KNEE   Back pain    Breast cancer ([place]) 2017   right breast   Cancer ([place])    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)   Carpal tunnel syndrome    Constipation    Depression    Dyspnea    GERD (gastroesophageal reflux disease)    NO MEDS   Headache    migraines   Hypertension    Joint pain    Last menstrual period (LMP) > 10 days ago 08/2015   Lower extremity edema    Migraines    Neuropathy    OSA (obstructive sleep apnea)    Personal history of radiation therapy    Prediabetes    SOB (shortness of breath)    Past Surgical History:  Procedure Laterality Date   BREAST BIOPSY Right 05/08/2017   invasive mamm. ca   BREAST EXCISIONAL BIOPSY Right 06/15/15   breast cancer   BREAST LUMPECTOMY WITH NEEDLE LOCALIZATION Right 06/15/2015   Procedure: BREAST LUMPECTOMY WITH NEEDLE LOCALIZATION;  Surgeon: Hubbard Robinson, MD;  Location: [facility];  Service: General;  Laterality: Right;   BREAST RECONSTRUCTION WITH PLACEMENT OF TISSUE EXPANDER AND FLEX HD (ACELLULAR HYDRATED DERMIS) Left 02/09/2018   Procedure: BREAST RECONSTRUCTION WITH PLACEMENT OF TISSUE EXPANDER AND FLEX HD (ACELLULAR HYDRATED DERMIS);  Surgeon: Wallace Going, DO;   Location: [facility];  Service: Plastics;  Laterality: Left;   BREAST SURGERY     CESAREAN SECTION  1988   x1   COLONOSCOPY WITH PROPOFOL N/A 01/23/2017   Procedure: COLONOSCOPY WITH PROPOFOL;  Surgeon: Lin Landsman, MD;  Location[facility] ENDOSCOPY;  Service: Gastroenterology;  Laterality: N/A;   ESOPHAGOGASTRODUODENOSCOPY (EGD) WITH PROPOFOL N/A 01/23/2017   Procedure: ESOPHAGOGASTRODUODENOSCOPY (EGD) WITH PROPOFOL;  Surgeon: Lin Landsman, MD;  Location: [facility] ENDOSCOPY;  Service: Gastroenterology;  Laterality: N/A;   LATISSIMUS FLAP TO BREAST Right 06/01/2018   Procedure: RIGHT BREAST RECONSTRUCTION WITH LATISSIMUS MYOCUTANEOUS FLAP;  Surgeon: Wallace Going, DO;  Location: [place];  Service: Plastics;  Laterality: Right;   LIPOSUCTION WITH LIPOFILLING Bilateral 10/15/2018   Procedure: LIPOSUCTION TO BILATERAL BREASTS;  Surgeon: Wallace Going, DO;  Location: [place];  Service: Plastics;  Laterality: Bilateral;   LIPOSUCTION WITH LIPOFILLING Bilateral 04/22/2019   Procedure: fat grafting/liposuction and lipofilling bilateral breasts;  Surgeon: Wallace Going, DO;  Location: [place];  Service: Plastics;  Laterality: Bilateral;   MASS EXCISION N/A 05/20/2016   Procedure: EXCISION CHEST WALL MASS;  Surgeon: Florene Glen, MD;  Location: [facility];  Service: General;  Laterality: N/A;   RECONSTRUCTION BREAST W/ LATISSIMUS DORSI FLAP Right 06/01/2018   REMOVAL OF BILATERAL TISSUE EXPANDERS WITH PLACEMENT OF BILATERAL BREAST IMPLANTS Bilateral 10/15/2018   Procedure: REMOVAL OF BILATERAL TISSUE EXPANDERS WITH PLACEMENT OF BILATERAL BREAST IMPLANTS;  Surgeon: Wallace Going, DO;  Location: [place];  Service: Plastics;  Laterality: Bilateral;   SCAR REVISION Bilateral 04/22/2019   Procedure: Excision of bilateral scar contracture;  Surgeon: Wallace Going, DO;  Location: [place];  Service: Plastics;   Laterality: Bilateral;  total case 90 min   SENTINEL NODE BIOPSY Right 06/15/2015   Procedure: SENTINEL NODE BIOPSY;  Surgeon: Hubbard Robinson, MD;  Location: [facility];  Service: General;  Laterality: Right;   TOTAL MASTECTOMY Right 05/22/2017   Procedure: TOTAL MASTECTOMY;  Surgeon: Florene Glen, MD;  Location: [facility];  Service: General;  Laterality: Right;   TOTAL MASTECTOMY Left 02/09/2018   Procedure: LEFT PROPHYLACTIC  MASTECTOMY;  Surgeon: Jovita Kussmaul, MD;  Location: [facility];  Service: General;  Laterality: Left;   TUBAL LIGATION  2004   ULNAR NERVE TRANSPOSITION Left 01/29/2019   Procedure: LEFT ULNAR NERVE DECOMPRESSION/TRANSPOSITION;  Surgeon: Leanora Cover, MD;  Location: [place];  Service: Orthopedics;  Laterality: Left;   Patient Active Problem List   Diagnosis Date Noted   Elevated blood pressure reading in office with white coat syndrome, with diagnosis of hypertension 08/08/2021   Limited joint range of motion (ROM) 07/19/2021   Class 3 severe obesity due to excess calories with body mass index (BMI) of 45.0 to 49.9 in adult ([place]) 06/25/2021   Prediabetes 06/21/2021   Elevated ferritin level 06/21/2021   OSA on CPAP 04/20/2021   Migraine headache without aura 07/21/2020   Ulnar neuropathy 08/28/2018   S/P breast reconstruction, bilateral 06/16/2018   Acquired absence of right breast 06/01/2018   S/P mastectomy, bilateral 02/17/2018   Acquired absence of left breast 02/09/2018   Acquired absence of breast and absent nipple, right 01/02/2018   Postoperative breast asymmetry 01/02/2018   Intractable nausea and vomiting 08/11/2017   Breast cancer ([place]) 05/22/2017   Morbid (severe) obesity due to excess calories ([place]) 04/15/2017   Microcytic anemia 04/15/2017   GERD (gastroesophageal reflux disease) 04/15/2017   Chest wall mass    Malignant neoplasm of upper-outer quadrant of right female breast ([place]) 05/25/2015   HTN (hypertension) 05/24/2015     REFERRING DIAG: M25.60 (ICD-10-CM) - Limited joint range of motion (ROM)   THERAPY DIAG:  Chronic right shoulder pain   Muscle weakness (generalized)   PERTINENT HISTORY: Patient is a 51 year old female with R shoulder mobility deficits and R hand weakness. Pt reports undergoing mastectomy on her R side in 2019 and undergoing lymphedema therapy. She reports difficulty with accessing overhead motion on her R arm since then. Patient reports having to re-position arm when reaching overhead - she . She reports difficulty with her hands "locking up." Patient reports this is worse in her L hand presently. Pt has Hx of L ulnar nerve transposition in 2020 and Hx of carpel tunnel syndrome on Med Hx. She reports frequently dropping items when holding onto pen or kitchen items/spices. Patient is R hand dominant. Pt is in remission for breast cancer - since her surgery/chemo in fall of 2019. Pt reports fall off her front steps 9 months ago. Patient reports no imaging at the time; mainly integumentary injuries/scrapes. Pt has intermittent episodes of dizziness - pt describes  it as "woozy" feeling and blurry vision; pt does not describe room spinning sensation. No unexplained weight loss. Pt reports intermittent disturbed sleep due to R hip pain   PRECAUTIONS: None   OBJECTIVE: (objective measures completed at initial evaluation unless otherwise dated)     DIAGNOSTIC FINDINGS:  None for R shoulder or R upper limb recently    PATIENT SURVEYS:  FOTO 62   COGNITION:           Overall cognitive status: Within functional limits for tasks assessed                                  SENSATION: WFL   POSTURE: Rounded shoulders, anteriorly tilted scapulae, increased thoracic kyphosis   UPPER EXTREMITY ROM:    Active ROM Right 08/01/2021 Left 08/01/2021 Right 09/10/21 Left 09/10/21  Shoulder flexion 113* 130 146 136  Shoulder extension          Shoulder abduction 84* 149 153 164  Shoulder adduction           Shoulder internal rotation          Shoulder external rotation 78 83 78 84  Elbow flexion WNL WNL      Elbow extension -8 -4 -2 -3  Wrist flexion WNL WNL      Wrist extension WNL WNL      Wrist ulnar deviation WNL WNL      Wrist radial deviation WNL WNL      Wrist pronation WNL WNL      Wrist supination WNL WNL      (Blank rows = not tested)       Passive ROM Right 08/01/2021 Left 08/01/2021 Right 09/10/21  Shoulder flexion 130*   165  Shoulder extension        Shoulder abduction 90*   152  Shoulder adduction        Shoulder internal rotation [facility]      Shoulder external rotation WFL      (Blank rows = not tested) *Indicates pain     UPPER EXTREMITY MMT:   MMT Right 08/03/2021 Left 08/03/2021 Right 09/10/21 Left 09/10/21  Shoulder flexion -4 4 4+    Shoulder extension          Shoulder abduction 4 4+ 4+    Shoulder adduction          Shoulder internal rotation 4+* 5 5    Shoulder external rotation 4+ 5 4+    Middle trapezius          Lower trapezius          Elbow flexion 4+ 5 5    Elbow extension 4+ 5 5    Wrist flexion 4   5    Wrist extension 5   5    Wrist ulnar deviation 5   5    Wrist radial deviation 5   5    Wrist pronation 5*   5    Wrist supination 5   5    Grip strength (lbs) with JAMAR dynamometer (best of 3) 53.9 lb 81.3 lb 83.0 lb 82.3 lb  (Blank rows = not tested)   SHOULDER SPECIAL TESTS:            Impingement tests: Neer impingement test: positive , Hawkins/Kennedy impingement test: positive , and Painful arc test: positive  Rotator cuff assessment: Empty Can Positive, External rotation lag sign Negative   WRIST SPECIAL TESTS Tinel's sign (carpel tunnel): negative, Phalen's negative, Reverse Phalen's negative               08/06/21 ULTT's: pain with R median and ulnar nerve glide, no NT or referred pain to wrist/hand       JOINT MOBILITY TESTING:  Glenohumeral joint: Moderate restriction posterior and  inferior 08/06/21 Scapulothoracic: decreased protraction and upward rotation of scapula   PALPATION:  Tenderness to palpation along R middle deltoid, R bicipital groove, R lateral pectoralis major         TREATMENT TODAY   SUBJECTIVE: Pt reports minimal shoulder pain this AM. No significant soreness after last visit. Pt is compliant with HEP. Pt reports 85-90%  SANE score presently. Pt reports doing well with reaching for shower curtain. She reports doing well with household functional reaching. Pt reports she would like lesser shoulder soreness/discomfort near GHJ.          PAIN:  Are you having pain? No Pain location: -      Manual Therapy -    STM to R middle deltoid; x 3 minutes Supine gentle R shoulder PROM to check ROM, x1 minute Supine R shoulder GH AP mobilizations with shoulder at varying angles, grade II-III, 30s/bout x multiple bouts; Supine R shoulder GH inferior mobilizations with shoulder at varying angles and available end range ER, grade II-III, 30s/bout x multiple bouts;           *not performed this date* STM/DTM to right pectoralis major, primary tenderness at insertion  Supine pectoral stretch with hands behind head and light OP at elbows, 2x30 seconds  L sidelying R scapular upward rotation mobilizations, grade III, 30s/bout x 2 bouts; Radial, ulnar and median nerve glides to test for nerve pain patient has been feeling. Discovered reproduction of symptoms with median nerve. Taught median nerve glides and performed x12 within session       Therapeutic Exercise -    Upper body ergometer, 2 minutes forward, 2 minutes backward - for tissue warm-up to improve muscle performance, improved soft tissue mobility/extensibility - unbilled time  Standing doorway pectoral stretch, 3x30 seconds bilateral UE  Serratus slide at wall with foam roller; 2x8  Standing scaption with 4-lb Dbells; 2x8   Bilateral shoulder ER with scapular retraction; Green Tband;  2x10   Pt edu: Discussed current progress c PT, prognosis, continued plan of care and anticipated discharge         *not today* Standing R shoulder flexion to 90d with 3# DB 2x10; Standing R shoulder abduction to 90d with 3# DB 2x10; Seated R upper trap and levator scap stretch, x30 seconds each  Standing row BUE, GreenTB, 2x10, tactile cueing for scapular retraction/depression Seated R shoulder ER with YellowTB 2 x 10; R shoulder abduction with elbow at 90d, neutral rotation with 2# DB 2x10; Serratus protraction in modified push-up position (hands on table) 2 x 10;       PATIENT EDUCATION: Education details: Pt educated throughout session about proper posture and technique with exercises. Improved exercise technique, movement at target joints, use of target muscles after min to mod verbal, visual, tactile cues.  Person educated: Patient Education method: Explanation and demonstration Education comprehension: verbalized understanding and returned demonstration     HOME EXERCISE PROGRAM: Access Code [email or web address]                   ASSESSMENT:  CLINICAL IMPRESSION: Patient demonstrates markedly improved R shoulder AROM for shoulder elevation and improved MMTs in all planes of motion with only R shoulder external rotator strength remaining unchanged. She has significant improvement in pain status and has improved with overhead activity/reaching tasks. Pt has surpassed predicted FOTO score. She has R hand grip strength consistent with age and gender-based norm. She has minimal pain in R shoulder today and is making good progress. Decreasing sensitivity along pec major noted. Pt will benefit from continued skilled PT services to address deficits remaining in strength, range of motion, and residual intermittent pain in order to return to full function at home with anticipated D/C in next 2-3 weeks.          OBJECTIVE IMPAIRMENTS decreased ROM, decreased strength, hypomobility, impaired  flexibility, impaired UE functional use, postural dysfunction, and pain.    ACTIVITY LIMITATIONS cleaning and meal prep.    PERSONAL FACTORS 3+ comorbidities: (obesity, Hx of breast cancer and mastectomy, depression, HTN, Hx of carpel tunnel syndrome)   and time since onset of pt's condition are also affecting patient's functional outcome.      REHAB POTENTIAL: Good   CLINICAL DECISION MAKING: Evolving/moderate complexity   EVALUATION COMPLEXITY: Moderate     GOALS:     SHORT TERM GOALS: Target date: 08/23/2021   Patient will be compliant and independent with given HEP as needed to augment PT intervention for carryover of in-clinic intervention  Baseline: 08/01/21: Baseline HEP initiated (see [place]).   09/10/21: Patient is compliant with progressed HEP  Goal status: ACHIEVED     LONG TERM GOALS: Target date: 09/14/2021   Patient will demonstrate improved function as evidenced by a score of 66 on FOTO measure for full participation in activities at home and in the community.   Baseline: 08/01/21: 62.  09/10/21: 67 Goal status: ACHIEVED   2.  Patient will have R shoulder flexion and abduction within 10 degrees of contralateral shoulder indicative of improved ability to elevate R upper limb as needed for functional reaching, self-care ADLs, overhead activity Baseline: 08/01/21: Shoulder AROM R/L: Flexion 113*/130, Abduction 84*/149 (*Indicates pain).    09/10/21: Shoulder AROM R/L Flexion 146/136, Abduction 153/164.  Goal status: IN PROGRESS/MOSTLY MET   3.  Patient will improve R grip strength to at least 79.5 lbs reflective of at least having grip strength within standard error of contralateral upper limb (based on baseline measures) indicative of improved ability to perform prehensile tasks and hold items in R hand Baseline: 08/01/21: Grip strength (JAMAR): R 53.9, L 81.3.   09/10/21: Grip strength (JAMAR): R 83.0, L 82.3.  Goal status: ACHIEVED   4.  Patient will improve  shoulder and elbow MMTs by at least 1/2 MMT grade indicative of improved strength as needed for ability to perform daily functional lifting tasks Baseline: 08/01/21: MMT R/L Shoulder flexion 4-/4, shoulder abduction 4/4+, shoulder ER 4+/5, shoulder IR 4+/5.  09/10/21: Met for all with exception of shoulder ER manual muscle test unchanged.  Goal status: IN PROGRESS/MOSTLY MET     PLAN: PT FREQUENCY: 2x/week   PT DURATION: 6 weeks   PLANNED INTERVENTIONS: Therapeutic exercises, Therapeutic activity, Neuromuscular re-education, Patient/Family education, Joint mobilization, Cryotherapy, Moist heat, and Manual therapy   PLAN FOR NEXT SESSION: Progress with strengthening, scapular mobility and serratus anterior activation for overhead mobility, postural re-education. Recommend continued PT 2x/week for 2-3 weeks with anticipated discharge following that time.        Michelle Mcmillan, PT 09/13/2021, 1:20  PM

## 2021-09-14 ENCOUNTER — Ambulatory Visit
Admission: RE | Admit: 2021-09-14 | Discharge: 2021-09-14 | Disposition: A | Discharge status: Home / Self Care | Payer: Medicare Other | Source: Ambulatory Visit | Attending: Nurse Practitioner | Admitting: Nurse Practitioner

## 2021-09-14 DIAGNOSIS — R7989 Other specified abnormal findings of blood chemistry: Secondary | ICD-10-CM | POA: Insufficient documentation

## 2021-09-14 DIAGNOSIS — M25551 Pain in right hip: Secondary | ICD-10-CM | POA: Insufficient documentation

## 2021-09-14 DIAGNOSIS — M25552 Pain in left hip: Secondary | ICD-10-CM | POA: Diagnosis present

## 2021-09-14 DIAGNOSIS — Z170 Estrogen receptor positive status [ER+]: Secondary | ICD-10-CM | POA: Diagnosis present

## 2021-09-14 DIAGNOSIS — C50411 Malignant neoplasm of upper-outer quadrant of right female breast: Secondary | ICD-10-CM | POA: Diagnosis present

## 2021-09-14 MED ORDER — IOHEXOL 300 MG/ML  SOLN
100.0000 mL | Freq: Once | INTRAMUSCULAR | Status: AC | PRN
Start: 2021-09-14 — End: 2021-09-14
  Administered 2021-09-14: 100 mL via INTRAVENOUS

## 2021-09-17 ENCOUNTER — Encounter: Payer: Self-pay | Admitting: Physical Therapy

## 2021-09-17 ENCOUNTER — Other Ambulatory Visit: Payer: Self-pay | Admitting: Nurse Practitioner

## 2021-09-17 ENCOUNTER — Ambulatory Visit: Payer: Medicare Other | Admitting: Physical Therapy

## 2021-09-17 DIAGNOSIS — G8929 Other chronic pain: Secondary | ICD-10-CM

## 2021-09-17 DIAGNOSIS — M25511 Pain in right shoulder: Secondary | ICD-10-CM | POA: Diagnosis not present

## 2021-09-17 DIAGNOSIS — M25611 Stiffness of right shoulder, not elsewhere classified: Secondary | ICD-10-CM

## 2021-09-17 DIAGNOSIS — M25551 Pain in right hip: Secondary | ICD-10-CM

## 2021-09-17 DIAGNOSIS — M6281 Muscle weakness (generalized): Secondary | ICD-10-CM

## 2021-09-17 NOTE — Progress Notes (Signed)
Spoke to patient by phone. No evidence of recurrent or metastatic disease on ct imaging. Hip pain likely result of degeneration/arthritis of hip joint. Discussed PCP vs referral to ortho. Patient requests ortho ref. Will send.

## 2021-09-17 NOTE — Therapy (Signed)
OUTPATIENT PHYSICAL THERAPY TREATMENT NOTE   Patient Name: Michelle Mcmillan MRN: [id number] DOB:1970/05/27, 51 y.o., female Today's Date: 09/17/2021  PCP: Donnie Coffin, MD REFERRING PROVIDER: Esaw Grandchild, MD  END OF SESSION:   PT End of Session - 09/17/21 0939     Visit Number 12    Number of Visits 13    Date for PT Re-Evaluation 09/13/21    Authorization Type UHC Medicare/Medicaid, VL based on medical necessity    Progress Note Due on Visit 10    PT Start Time 0937    PT Stop Time 1015    PT Time Calculation (min) 38 min    Activity Tolerance Patient tolerated treatment well    Behavior During Therapy [facility] for tasks assessed/performed             Past Medical History:  Diagnosis Date   Anemia    H/O   Anemia    Arthritis    RIGHT KNEE   Back pain    Breast cancer ([place]) 2017   right breast   Cancer ([place])    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)   Carpal tunnel syndrome    Constipation    Depression    Dyspnea    GERD (gastroesophageal reflux disease)    NO MEDS   Headache    migraines   Hypertension    Joint pain    Last menstrual period (LMP) > 10 days ago 08/2015   Lower extremity edema    Migraines    Neuropathy    OSA (obstructive sleep apnea)    Personal history of radiation therapy    Prediabetes    SOB (shortness of breath)    Past Surgical History:  Procedure Laterality Date   BREAST BIOPSY Right 05/08/2017   invasive mamm. ca   BREAST EXCISIONAL BIOPSY Right 06/15/15   breast cancer   BREAST LUMPECTOMY WITH NEEDLE LOCALIZATION Right 06/15/2015   Procedure: BREAST LUMPECTOMY WITH NEEDLE LOCALIZATION;  Surgeon: Hubbard Robinson, MD;  Location: [facility];  Service: General;  Laterality: Right;   BREAST RECONSTRUCTION WITH PLACEMENT OF TISSUE EXPANDER AND FLEX HD (ACELLULAR HYDRATED DERMIS) Left 02/09/2018   Procedure: BREAST RECONSTRUCTION WITH PLACEMENT OF TISSUE EXPANDER AND FLEX HD (ACELLULAR HYDRATED DERMIS);  Surgeon: Wallace Going, DO;   Location: [facility];  Service: Plastics;  Laterality: Left;   BREAST SURGERY     CESAREAN SECTION  1988   x1   COLONOSCOPY WITH PROPOFOL N/A 01/23/2017   Procedure: COLONOSCOPY WITH PROPOFOL;  Surgeon: Lin Landsman, MD;  Location: [facility];  Service: Gastroenterology;  Laterality: N/A;   ESOPHAGOGASTRODUODENOSCOPY (EGD) WITH PROPOFOL N/A 01/23/2017   Procedure: ESOPHAGOGASTRODUODENOSCOPY (EGD) WITH PROPOFOL;  Surgeon: Lin Landsman, MD;  Location[facility];  Service: Gastroenterology;  Laterality: N/A;   LATISSIMUS FLAP TO BREAST Right 06/01/2018   Procedure: RIGHT BREAST RECONSTRUCTION WITH LATISSIMUS MYOCUTANEOUS FLAP;  Surgeon: Wallace Going, DO;  Location: [place];  Service: Plastics;  Laterality: Right;   LIPOSUCTION WITH LIPOFILLING Bilateral 10/15/2018   Procedure: LIPOSUCTION TO BILATERAL BREASTS;  Surgeon: Wallace Going, DO;  Location: [place];  Service: Plastics;  Laterality: Bilateral;   LIPOSUCTION WITH LIPOFILLING Bilateral 04/22/2019   Procedure: fat grafting/liposuction and lipofilling bilateral breasts;  Surgeon: Wallace Going, DO;  Location: [place];  Service: Plastics;  Laterality: Bilateral;   MASS EXCISION N/A 05/20/2016   Procedure: EXCISION CHEST WALL MASS;  Surgeon: Florene Glen, MD;  Location: [facility];  Service: General;  Laterality: N/A;   RECONSTRUCTION BREAST W/ LATISSIMUS DORSI FLAP Right 06/01/2018   REMOVAL OF BILATERAL TISSUE EXPANDERS WITH PLACEMENT OF BILATERAL BREAST IMPLANTS Bilateral 10/15/2018   Procedure: REMOVAL OF BILATERAL TISSUE EXPANDERS WITH PLACEMENT OF BILATERAL BREAST IMPLANTS;  Surgeon: Wallace Going, DO;  Location: [place];  Service: Plastics;  Laterality: Bilateral;   SCAR REVISION Bilateral 04/22/2019   Procedure: Excision of bilateral scar contracture;  Surgeon: Wallace Going, DO;  Location: [place];  Service: Plastics;   Laterality: Bilateral;  total case 90 min   SENTINEL NODE BIOPSY Right 06/15/2015   Procedure: SENTINEL NODE BIOPSY;  Surgeon: Hubbard Robinson, MD;  Location: [facility];  Service: General;  Laterality: Right;   TOTAL MASTECTOMY Right 05/22/2017   Procedure: TOTAL MASTECTOMY;  Surgeon: Florene Glen, MD;  Location: [facility];  Service: General;  Laterality: Right;   TOTAL MASTECTOMY Left 02/09/2018   Procedure: LEFT PROPHYLACTIC  MASTECTOMY;  Surgeon: Jovita Kussmaul, MD;  Location: [facility];  Service: General;  Laterality: Left;   TUBAL LIGATION  2004   ULNAR NERVE TRANSPOSITION Left 01/29/2019   Procedure: LEFT ULNAR NERVE DECOMPRESSION/TRANSPOSITION;  Surgeon: Leanora Cover, MD;  Location: [place];  Service: Orthopedics;  Laterality: Left;   Patient Active Problem List   Diagnosis Date Noted   Elevated blood pressure reading in office with white coat syndrome, with diagnosis of hypertension 08/08/2021   Limited joint range of motion (ROM) 07/19/2021   Class 3 severe obesity due to excess calories with body mass index (BMI) of 45.0 to 49.9 in adult ([place]) 06/25/2021   Prediabetes 06/21/2021   Elevated ferritin level 06/21/2021   OSA on CPAP 04/20/2021   Migraine headache without aura 07/21/2020   Ulnar neuropathy 08/28/2018   S/P breast reconstruction, bilateral 06/16/2018   Acquired absence of right breast 06/01/2018   S/P mastectomy, bilateral 02/17/2018   Acquired absence of left breast 02/09/2018   Acquired absence of breast and absent nipple, right 01/02/2018   Postoperative breast asymmetry 01/02/2018   Intractable nausea and vomiting 08/11/2017   Breast cancer ([place]) 05/22/2017   Morbid (severe) obesity due to excess calories ([place]) 04/15/2017   Microcytic anemia 04/15/2017   GERD (gastroesophageal reflux disease) 04/15/2017   Chest wall mass    Malignant neoplasm of upper-outer quadrant of right female breast ([place]) 05/25/2015   HTN (hypertension) 05/24/2015     REFERRING DIAG: M25.60 (ICD-10-CM) - Limited joint range of motion (ROM)   THERAPY DIAG:  Chronic right shoulder pain   Muscle weakness (generalized)   PERTINENT HISTORY: Patient is a 51 year old female with R shoulder mobility deficits and R hand weakness. Pt reports undergoing mastectomy on her R side in 2019 and undergoing lymphedema therapy. She reports difficulty with accessing overhead motion on her R arm since then. Patient reports having to re-position arm when reaching overhead - she . She reports difficulty with her hands "locking up." Patient reports this is worse in her L hand presently. Pt has Hx of L ulnar nerve transposition in 2020 and Hx of carpel tunnel syndrome on Med Hx. She reports frequently dropping items when holding onto pen or kitchen items/spices. Patient is R hand dominant. Pt is in remission for breast cancer - since her surgery/chemo in fall of 2019. Pt reports fall off her front steps 9 months ago. Patient reports no imaging at the time; mainly integumentary injuries/scrapes. Pt has intermittent episodes of dizziness - pt describes  it as "woozy" feeling and blurry vision; pt does not describe room spinning sensation. No unexplained weight loss. Pt reports intermittent disturbed sleep due to R hip pain   PRECAUTIONS: None   OBJECTIVE: (objective measures completed at initial evaluation unless otherwise dated)     DIAGNOSTIC FINDINGS:  None for R shoulder or R upper limb recently    PATIENT SURVEYS:  FOTO 62   COGNITION:           Overall cognitive status: Within functional limits for tasks assessed                                  SENSATION: WFL   POSTURE: Rounded shoulders, anteriorly tilted scapulae, increased thoracic kyphosis   UPPER EXTREMITY ROM:    Active ROM Right 08/01/2021 Left 08/01/2021 Right 09/10/21 Left 09/10/21  Shoulder flexion 113* 130 146 136  Shoulder extension          Shoulder abduction 84* 149 153 164  Shoulder adduction           Shoulder internal rotation          Shoulder external rotation 78 83 78 84  Elbow flexion WNL WNL      Elbow extension -8 -4 -2 -3  Wrist flexion WNL WNL      Wrist extension WNL WNL      Wrist ulnar deviation WNL WNL      Wrist radial deviation WNL WNL      Wrist pronation WNL WNL      Wrist supination WNL WNL      (Blank rows = not tested)       Passive ROM Right 08/01/2021 Left 08/01/2021 Right 09/10/21  Shoulder flexion 130*   165  Shoulder extension        Shoulder abduction 90*   152  Shoulder adduction        Shoulder internal rotation [facility]      Shoulder external rotation WFL      (Blank rows = not tested) *Indicates pain     UPPER EXTREMITY MMT:   MMT Right 08/03/2021 Left 08/03/2021 Right 09/10/21 Left 09/10/21  Shoulder flexion -4 4 4+    Shoulder extension          Shoulder abduction 4 4+ 4+    Shoulder adduction          Shoulder internal rotation 4+* 5 5    Shoulder external rotation 4+ 5 4+    Middle trapezius          Lower trapezius          Elbow flexion 4+ 5 5    Elbow extension 4+ 5 5    Wrist flexion 4   5    Wrist extension 5   5    Wrist ulnar deviation 5   5    Wrist radial deviation 5   5    Wrist pronation 5*   5    Wrist supination 5   5    Grip strength (lbs) with JAMAR dynamometer (best of 3) 53.9 lb 81.3 lb 83.0 lb 82.3 lb  (Blank rows = not tested)   SHOULDER SPECIAL TESTS:            Impingement tests: Neer impingement test: positive , Hawkins/Kennedy impingement test: positive , and Painful arc test: positive  Rotator cuff assessment: Empty Can Positive, External rotation lag sign Negative   WRIST SPECIAL TESTS Tinel's sign (carpel tunnel): negative, Phalen's negative, Reverse Phalen's negative               08/06/21 ULTT's: pain with R median and ulnar nerve glide, no NT or referred pain to wrist/hand       JOINT MOBILITY TESTING:  Glenohumeral joint: Moderate restriction posterior and  inferior 08/06/21 Scapulothoracic: decreased protraction and upward rotation of scapula   PALPATION:  Tenderness to palpation along R middle deltoid, R bicipital groove, R lateral pectoralis major         TREATMENT TODAY   SUBJECTIVE: Pt denies pain at arrival to PT. She denies any notable soreness or issues after her last visit. Pt received all from her nurse practitioner this AM - no evidence malignancy on her CT scan. Degenerative changes were noted in hips and lower lumbar spine per patient's report.           PAIN:  Are you having pain? No Pain location: -      Manual Therapy - *no manual therapy performed this session*   *not performed this date* STM to R middle deltoid, R pectoralis major; x 3 minutes Supine gentle R shoulder PROM to check ROM, x1 minute Supine R shoulder GH AP mobilizations with shoulder at varying angles, grade II-III, 30s/bout x multiple bouts; Supine R shoulder GH inferior mobilizations with shoulder at varying angles and available end range ER, grade II-III, 30s/bout x multiple bouts;  STM/DTM to right pectoralis major, primary tenderness at insertion  Supine pectoral stretch with hands behind head and light OP at elbows, 2x30 seconds  L sidelying R scapular upward rotation mobilizations, grade III, 30s/bout x 2 bouts; Radial, ulnar and median nerve glides to test for nerve pain patient has been feeling. Discovered reproduction of symptoms with median nerve. Taught median nerve glides and performed x12 within session       Therapeutic Exercise -    Upper body ergometer, 2 minutes forward, 2 minutes backward - for tissue warm-up to improve muscle performance, improved soft tissue mobility/extensibility - subjective information gathered during this time   Standing doorway pectoral stretch, 3x30 seconds bilateral UE   Serratus slide at wall with foam roller; 2x10   Standing scaption with 4-lb Dbells; 3x8   Bilateral shoulder ER with scapular  retraction; Green Tband; 2x10  Alternating horizontal abduction, back to wall; Red Tband; 2x10 alternating R/L  Suitcase carry; 9-lb Dbell for 70-ft; 2x D/B (minimal challenge) -progressed to 25-lb plate on cord; completed 70-ft walk 1x D/B; verbal cueing and demonstration for posture and correct body mechanics with turning during this carrying drill  Nautilus standing row; 40-lb; 2x8  -tactile cueing for scapular retraction/depression     Pt edu: Discussed benefit of weightbearing exercises for maintaining bone density and performing only within limits of pain.        *not today* Standing R shoulder flexion to 90d with 3# DB 2x10; Standing R shoulder abduction to 90d with 3# DB 2x10; Seated R upper trap and levator scap stretch, x30 seconds each  Standing row BUE, GreenTB, 2x10, tactile cueing for scapular retraction/depression Seated R shoulder ER with YellowTB 2 x 10; R shoulder abduction with elbow at 90d, neutral rotation with 2# DB 2x10; Serratus protraction in modified push-up position (hands on table) 2 x 10;       PATIENT EDUCATION: Education details: Pt educated throughout session about proper posture and  technique with exercises. Improved exercise technique, movement at target joints, use of target muscles after min to mod verbal, visual, tactile cues.  Person educated: Patient Education method: Explanation and demonstration Education comprehension: verbalized understanding and returned demonstration     HOME EXERCISE PROGRAM: Access Code [email or web address]                   ASSESSMENT:   CLINICAL IMPRESSION:  Focused on active intervention today with progressive loading of affected tissues, postural re-edu/periscapular strengthening, and utilizing functional patterns and compound movements to strengthen her grip. Patient is able to perform suitcase carry with significant load simulating demands of carrying heavy bag. She demonstrates full shoulder elevation and is able to  increase demands of periscapular strengthening and progressive loading of shoulder external rotators. Patient is making excellent progress with R shoulder at this time and approaching end-phase of PT. Given history of cancer, patient fortunately had (-) CT scan results with scan on 09/14/21. Pt will benefit from continued skilled PT services to address deficits remaining in strength, range of motion, and residual intermittent pain in order to return to full function at home with anticipated D/C in next 2-3 weeks.          OBJECTIVE IMPAIRMENTS decreased ROM, decreased strength, hypomobility, impaired flexibility, impaired UE functional use, postural dysfunction, and pain.    ACTIVITY LIMITATIONS cleaning and meal prep.    PERSONAL FACTORS 3+ comorbidities: (obesity, Hx of breast cancer and mastectomy, depression, HTN, Hx of carpel tunnel syndrome)   and time since onset of pt's condition are also affecting patient's functional outcome.      REHAB POTENTIAL: Good   CLINICAL DECISION MAKING: Evolving/moderate complexity   EVALUATION COMPLEXITY: Moderate     GOALS:     SHORT TERM GOALS: Target date: 08/23/2021   Patient will be compliant and independent with given HEP as needed to augment PT intervention for carryover of in-clinic intervention  Baseline: 08/01/21: Baseline HEP initiated (see [place]).   09/10/21: Patient is compliant with progressed HEP  Goal status: ACHIEVED     LONG TERM GOALS: Target date: 09/14/2021   Patient will demonstrate improved function as evidenced by a score of 66 on FOTO measure for full participation in activities at home and in the community.   Baseline: 08/01/21: 62.  09/10/21: 67 Goal status: ACHIEVED   2.  Patient will have R shoulder flexion and abduction within 10 degrees of contralateral shoulder indicative of improved ability to elevate R upper limb as needed for functional reaching, self-care ADLs, overhead activity Baseline: 08/01/21:  Shoulder AROM R/L: Flexion 113*/130, Abduction 84*/149 (*Indicates pain).    09/10/21: Shoulder AROM R/L Flexion 146/136, Abduction 153/164.  Goal status: IN PROGRESS/MOSTLY MET   3.  Patient will improve R grip strength to at least 79.5 lbs reflective of at least having grip strength within standard error of contralateral upper limb (based on baseline measures) indicative of improved ability to perform prehensile tasks and hold items in R hand Baseline: 08/01/21: Grip strength (JAMAR): R 53.9, L 81.3.   09/10/21: Grip strength (JAMAR): R 83.0, L 82.3.  Goal status: ACHIEVED   4.  Patient will improve shoulder and elbow MMTs by at least 1/2 MMT grade indicative of improved strength as needed for ability to perform daily functional lifting tasks Baseline: 08/01/21: MMT R/L Shoulder flexion 4-/4, shoulder abduction 4/4+, shoulder ER 4+/5, shoulder IR 4+/5.  09/10/21: Met for all with exception of shoulder ER manual muscle test unchanged.  Goal status: IN PROGRESS/MOSTLY MET     PLAN: PT FREQUENCY: 2x/week   PT DURATION: 6 weeks   PLANNED INTERVENTIONS: Therapeutic exercises, Therapeutic activity, Neuromuscular re-education, Patient/Family education, Joint mobilization, Cryotherapy, Moist heat, and Manual therapy   PLAN FOR NEXT SESSION: Progress with strengthening, scapular mobility and serratus anterior activation for overhead mobility, postural re-education. Anticipate D/C over next 2 weeks.      Valentina Gu, PT, DPT #[id number]  Eilleen Kempf, PT 09/17/2021, 9:40 AM

## 2021-09-18 ENCOUNTER — Inpatient Hospital Stay: Payer: Medicare Other

## 2021-09-18 ENCOUNTER — Encounter: Payer: Self-pay | Admitting: Licensed Clinical Social Worker

## 2021-09-18 ENCOUNTER — Inpatient Hospital Stay (HOSPITAL_BASED_OUTPATIENT_CLINIC_OR_DEPARTMENT_OTHER): Payer: Medicare Other | Admitting: Licensed Clinical Social Worker

## 2021-09-18 DIAGNOSIS — Z853 Personal history of malignant neoplasm of breast: Secondary | ICD-10-CM | POA: Diagnosis not present

## 2021-09-18 DIAGNOSIS — Z803 Family history of malignant neoplasm of breast: Secondary | ICD-10-CM | POA: Diagnosis not present

## 2021-09-18 NOTE — Progress Notes (Signed)
REFERRING PROVIDER: Verlon Au, NP [place],  [place] [address]  PRIMARY PROVIDER:  Donnie Coffin, MD  PRIMARY REASON FOR VISIT:  1. Family history of breast cancer   2. Personal history of breast cancer      HISTORY OF PRESENT ILLNESS:   Michelle Mcmillan, a 51 y.o. female, was seen for a [facility] cancer genetics consultation at the request of due to a personal and  family history of cancer.  Michelle Mcmillan presents to clinic today to discuss the possibility of a hereditary predisposition to cancer, genetic testing, and to further clarify her future cancer risks, as well as potential cancer risks for family members.   At the age of 28, Michelle Mcmillan was diagnosed with right breast cancer, ER/PR+, HER2-. Michelle Mcmillan was diagnosed with a second right breast primary at 44 and had a double mastectomy. She also had genetic testing of the BRCA1/BRCA2 genes that was negative/normal.  CANCER HISTORY:  Oncology History   No history exists.     RISK FACTORS:  Menarche was at age 4-11.  First live birth at age 57.  OCP use for approximately 6 years.  Ovaries intact: yes.  Hysterectomy: no.  Menopausal status: postmenopausal.  HRT use: 0 years. Colonoscopy: yes; normal.  Past Medical History:  Diagnosis Date   Anemia    H/O   Anemia    Arthritis    RIGHT KNEE   Back pain    Breast cancer ([facility]) 2017   right breast   Cancer ([facility])    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)   Carpal tunnel syndrome    Constipation    Depression    Dyspnea    GERD (gastroesophageal reflux disease)    NO MEDS   Headache    migraines   Hypertension    Joint pain    Last menstrual period (LMP) > 10 days ago 08/2015   Lower extremity edema    Migraines    Neuropathy    OSA (obstructive sleep apnea)    Personal history of radiation therapy    Prediabetes    SOB (shortness of breath)     Past Surgical History:  Procedure Laterality Date   BREAST BIOPSY Right 05/08/2017   invasive mamm. ca    BREAST EXCISIONAL BIOPSY Right 06/15/15   breast cancer   BREAST LUMPECTOMY WITH NEEDLE LOCALIZATION Right 06/15/2015   Procedure: BREAST LUMPECTOMY WITH NEEDLE LOCALIZATION;  Surgeon: Hubbard Robinson, MD;  Location: [facility];  Service: General;  Laterality: Right;   BREAST RECONSTRUCTION WITH PLACEMENT OF TISSUE EXPANDER AND FLEX HD (ACELLULAR HYDRATED DERMIS) Left 02/09/2018   Procedure: BREAST RECONSTRUCTION WITH PLACEMENT OF TISSUE EXPANDER AND FLEX HD (ACELLULAR HYDRATED DERMIS);  Surgeon: Wallace Going, DO;  Location: [facility];  Service: Plastics;  Laterality: Left;   BREAST SURGERY     CESAREAN SECTION  1988   x1   COLONOSCOPY WITH PROPOFOL N/A 01/23/2017   Procedure: COLONOSCOPY WITH PROPOFOL;  Surgeon: Lin Landsman, MD;  Location[facility]COPY;  Service: Gastroenterology;  Laterality: N/A;   ESOPHAGOGASTRODUODENOSCOPY (EGD) WITH PROPOFOL N/A 01/23/2017   Procedure: ESOPHAGOGASTRODUODENOSCOPY (EGD) WITH PROPOFOL;  Surgeon: Lin Landsman, MD;  [facility];  Service: Gastroenterology;  Laterality: N/A;   LATISSIMUS FLAP TO BREAST Right 06/01/2018   Procedure: RIGHT BREAST RECONSTRUCTION WITH LATISSIMUS MYOCUTANEOUS FLAP;  Surgeon: Wallace Going, DO;  Location: [place];  Service: Plastics;  Laterality: Right;   LIPOSUCTION WITH LIPOFILLING Bilateral 10/15/2018  Procedure: LIPOSUCTION TO BILATERAL BREASTS;  Surgeon: Wallace Going, DO;  Location: [place];  Service: Plastics;  Laterality: Bilateral;   LIPOSUCTION WITH LIPOFILLING Bilateral 04/22/2019   Procedure: fat grafting/liposuction and lipofilling bilateral breasts;  Surgeon: Wallace Going, DO;  Location: [place];  Service: Plastics;  Laterality: Bilateral;   MASS EXCISION N/A 05/20/2016   Procedure: EXCISION CHEST WALL MASS;  Surgeon: Florene Glen, MD;  Location: [facility];  Service: General;  Laterality: N/A;   RECONSTRUCTION BREAST W/ LATISSIMUS  DORSI FLAP Right 06/01/2018   REMOVAL OF BILATERAL TISSUE EXPANDERS WITH PLACEMENT OF BILATERAL BREAST IMPLANTS Bilateral 10/15/2018   Procedure: REMOVAL OF BILATERAL TISSUE EXPANDERS WITH PLACEMENT OF BILATERAL BREAST IMPLANTS;  Surgeon: Wallace Going, DO;  Location: [place];  Service: Plastics;  Laterality: Bilateral;   SCAR REVISION Bilateral 04/22/2019   Procedure: Excision of bilateral scar contracture;  Surgeon: Wallace Going, DO;  Location: [place];  Service: Plastics;  Laterality: Bilateral;  total case 90 min   SENTINEL NODE BIOPSY Right 06/15/2015   Procedure: SENTINEL NODE BIOPSY;  Surgeon: Hubbard Robinson, MD;  Location: [facility];  Service: General;  Laterality: Right;   TOTAL MASTECTOMY Right 05/22/2017   Procedure: TOTAL MASTECTOMY;  Surgeon: Florene Glen, MD;  Location: [facility];  Service: General;  Laterality: Right;   TOTAL MASTECTOMY Left 02/09/2018   Procedure: LEFT PROPHYLACTIC  MASTECTOMY;  Surgeon: Jovita Kussmaul, MD;  Location: [facility];  Service: General;  Laterality: Left;   TUBAL LIGATION  2004   ULNAR NERVE TRANSPOSITION Left 01/29/2019   Procedure: LEFT ULNAR NERVE DECOMPRESSION/TRANSPOSITION;  Surgeon: Leanora Cover, MD;  Location: [place];  Service: Orthopedics;  Laterality: Left;    FAMILY HISTORY:  We obtained a detailed, 4-generation family history.  Significant diagnoses are listed below: Family History  Problem Relation Age of Onset   Hypertension Mother    Obesity Mother    Hypertension Father    Diabetes Father    Hypertension Sister    Breast cancer Paternal Aunt        dx 11s   Breast cancer Paternal Aunt        dx 55s   Breast cancer Cousin 66    Michelle Mcmillan has 3 daughters, 24, 33, 53 and 1 son 66. She has 1 maternal half sister, 3 paternal half sisters and 2 paternal half brothers. One of her paternal half sisters passed of epilepsy at 55-30.  Michelle Mcmillan mother is living  at 57. Maternal grandmother is living at 19, her sibling's daughter had breast cancer at 61. Patient's maternal grandfather passed at 23.  Michelle Mcmillan father is living at 37. Patient has 2 paternal half aunts (same maternal grandfather) that had breast cancer in their 78s. Of note, their mother (not related to patient) had breast cancer as well. Paternal grandmother passed in her 42s, no information about grandfather.  Ms. Trickey is unaware of previous family history of genetic testing for hereditary cancer risks. There is no reported Ashkenazi Jewish ancestry. There is no known consanguinity.    GENETIC COUNSELING ASSESSMENT: Ms. Mclelland is a 51 y.o. female with a personal and family history of breast cancer which is somewhat suggestive of a hereditary cancer syndrome and predisposition to cancer. We, therefore, discussed and recommended the following at today's visit.   DISCUSSION: We discussed that approximately 10% of breast cancer is hereditary. Most cases of hereditary breast cancer  are associated with BRCA1/BRCA2 genes, although there are other genes associated with hereditary breast cancer as well. Cancers and risks are gene specific. We discussed that testing is beneficial for several reasons including knowing about cancer risks, identifying potential screening and risk-reduction options that may be appropriate, and to understand if other family members could be at risk for cancer and allow them to undergo genetic testing.   We reviewed the characteristics, features and inheritance patterns of hereditary cancer syndromes. We also discussed genetic testing, including the appropriate family members to test, the process of testing, insurance coverage and turn-around-time for results. We discussed the implications of a negative, positive and/or variant of uncertain significant result. We recommended Ms. Tessmer pursue genetic testing for the [facility] Multi-Cancer+RNA gene panel.   The Multi-Cancer  Panel + RNA offered by [facility] includes sequencing and/or deletion duplication testing of the following 84 genes: AIP, ALK, APC, ATM, AXIN2,BAP1,  BARD1, BLM, BMPR1A, BRCA1, BRCA2, BRIP1, CASR, CDC73, CDH1, CDK4, CDKN1B, CDKN1C, CDKN2A (p14ARF), CDKN2A (p16INK4a), CEBPA, CHEK2, CTNNA1, DICER1, DIS3L2, EGFR (c.2369C>T, p.Thr790Met variant only), EPCAM (Deletion/duplication testing only), FH, FLCN, GATA2, GPC3, GREM1 (Promoter region deletion/duplication testing only), HOXB13 (c.251G>A, p.Gly84Glu), HRAS, KIT, MAX, MEN1, MET, MITF (c.952G>A, p.Glu318Lys variant only), MLH1, MSH2, MSH3, MSH6, MUTYH, NBN, NF1, NF2, NTHL1, PALB2, PDGFRA, PHOX2B, PMS2, POLD1, POLE, POT1, PRKAR1A, PTCH1, PTEN, RAD50, RAD51C, RAD51D, RB1, RECQL4, RET, RUNX1, SDHAF2, SDHA (sequence changes only), SDHB, SDHC, SDHD, SMAD4, SMARCA4, SMARCB1, SMARCE1, STK11, SUFU, TERC, TERT, TMEM127, TP53, TSC1, TSC2, VHL, WRN and WT1.  Based on Ms. Evitts's personal and family history of cancer, she meets medical criteria for genetic testing. Despite that she meets criteria, she may still have an out of pocket cost. We discussed that if her out of pocket cost for testing is over $100, the laboratory will call and confirm whether she wants to proceed with testing.  If the out of pocket cost of testing is less than $100 she will be billed by the genetic testing laboratory.   PLAN: After considering the risks, benefits, and limitations, Ms. Lenhardt provided informed consent to pursue genetic testing and the blood sample was sent to [facility] for analysis of the Multi-Cancer Panel+RNA. Results should be available within approximately 2-3 weeks' time, at which point they will be disclosed by telephone to Ms. Loney, as will any additional recommendations warranted by these results. Ms. Kibby will receive a summary of her genetic counseling visit and a copy of her results once available. This information will also be available in Epic.   Ms.  Iyer questions were answered to her satisfaction today. Our contact information was provided should additional questions or concerns arise. Thank you for the referral and allowing [place] to share in the care of your patient.   Faith Rogue, MS, [facility] Genetic Counselor [email or web address] Phone: [phone]  The patient was seen for a total of 30 minutes in face-to-face genetic counseling.  Dr. Grayland Ormond was available for discussion regarding this case.   _______________________________________________________________________ For Office Staff:  Number of people involved in session: 2 Was an Intern/ student involved with case: yes

## 2021-09-19 ENCOUNTER — Ambulatory Visit: Payer: Medicare Other | Admitting: Physical Therapy

## 2021-09-19 ENCOUNTER — Encounter: Payer: Self-pay | Admitting: Physical Therapy

## 2021-09-19 DIAGNOSIS — M25511 Pain in right shoulder: Secondary | ICD-10-CM | POA: Diagnosis not present

## 2021-09-19 DIAGNOSIS — M25611 Stiffness of right shoulder, not elsewhere classified: Secondary | ICD-10-CM

## 2021-09-19 DIAGNOSIS — G8929 Other chronic pain: Secondary | ICD-10-CM

## 2021-09-19 DIAGNOSIS — M6281 Muscle weakness (generalized): Secondary | ICD-10-CM

## 2021-09-19 NOTE — Therapy (Incomplete)
OUTPATIENT PHYSICAL THERAPY TREATMENT NOTE   Patient Name: Michelle Mcmillan MRN: [id number] DOB:[birth year], 51 y.o., female Today's Date: 09/19/2021  PCP: Donnie Coffin, MD REFERRING PROVIDER: Esaw Grandchild, NP  END OF SESSION:   PT End of Session - 09/19/21 0939     Visit Number 13    Number of Visits 15    Date for PT Re-Evaluation 09/26/21    Authorization Type UHC Medicare/Medicaid, VL based on medical necessity    Progress Note Due on Visit 10    PT Start Time 0938    PT Stop Time 1015    PT Time Calculation (min) 37 min    Activity Tolerance Patient tolerated treatment well    Behavior During Therapy [facility] for tasks assessed/performed             Past Medical History:  Diagnosis Date   Anemia    H/O   Anemia    Arthritis    RIGHT KNEE   Back pain    Breast cancer ([place]) 2017   right breast   Cancer ([place])    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)   Carpal tunnel syndrome    Constipation    Depression    Dyspnea    GERD (gastroesophageal reflux disease)    NO MEDS   Headache    migraines   Hypertension    Joint pain    Last menstrual period (LMP) > 10 days ago 08/2015   Lower extremity edema    Migraines    Neuropathy    OSA (obstructive sleep apnea)    Personal history of radiation therapy    Prediabetes    SOB (shortness of breath)    Past Surgical History:  Procedure Laterality Date   BREAST BIOPSY Right 05/08/2017   invasive mamm. ca   BREAST EXCISIONAL BIOPSY Right 06/15/15   breast cancer   BREAST LUMPECTOMY WITH NEEDLE LOCALIZATION Right 06/15/2015   Procedure: BREAST LUMPECTOMY WITH NEEDLE LOCALIZATION;  Surgeon: Hubbard Robinson, MD;  Location: [facility] ORS;  Service: General;  Laterality: Right;   BREAST RECONSTRUCTION WITH PLACEMENT OF TISSUE EXPANDER AND FLEX HD (ACELLULAR HYDRATED DERMIS) Left 02/09/2018   Procedure: BREAST RECONSTRUCTION WITH PLACEMENT OF TISSUE EXPANDER AND FLEX HD (ACELLULAR HYDRATED DERMIS);  Surgeon: Wallace Going, DO;   Location: [facility];  Service: Plastics;  Laterality: Left;   BREAST SURGERY     CESAREAN SECTION  1988   x1   COLONOSCOPY WITH PROPOFOL N/A 01/23/2017   Procedure: COLONOSCOPY WITH PROPOFOL;  Surgeon: Lin Landsman, MD;  Location: [facility] ENDOSCOPY;  Service: Gastroenterology;  Laterality: N/A;   ESOPHAGOGASTRODUODENOSCOPY (EGD) WITH PROPOFOL N/A 01/23/2017   Procedure: ESOPHAGOGASTRODUODENOSCOPY (EGD) WITH PROPOFOL;  Surgeon: Lin Landsman, MD;  Location: [facility] ENDOSCOPY;  Service: Gastroenterology;  Laterality: N/A;   LATISSIMUS FLAP TO BREAST Right 06/01/2018   Procedure: RIGHT BREAST RECONSTRUCTION WITH LATISSIMUS MYOCUTANEOUS FLAP;  Surgeon: Wallace Going, DO;  Location: [place];  Service: Plastics;  Laterality: Right;   LIPOSUCTION WITH LIPOFILLING Bilateral 10/15/2018   Procedure: LIPOSUCTION TO BILATERAL BREASTS;  Surgeon: Wallace Going, DO;  Location: [place];  Service: Plastics;  Laterality: Bilateral;   LIPOSUCTION WITH LIPOFILLING Bilateral 04/22/2019   Procedure: fat grafting/liposuction and lipofilling bilateral breasts;  Surgeon: Wallace Going, DO;  Location: [place];  Service: Plastics;  Laterality: Bilateral;   MASS EXCISION N/A 05/20/2016   Procedure: EXCISION CHEST WALL MASS;  Surgeon: Florene Glen, MD;  Location: [facility];  Service: General;  Laterality: N/A;   RECONSTRUCTION BREAST W/ LATISSIMUS DORSI FLAP Right 06/01/2018   REMOVAL OF BILATERAL TISSUE EXPANDERS WITH PLACEMENT OF BILATERAL BREAST IMPLANTS Bilateral 10/15/2018   Procedure: REMOVAL OF BILATERAL TISSUE EXPANDERS WITH PLACEMENT OF BILATERAL BREAST IMPLANTS;  Surgeon: Wallace Going, DO;  Location: [place];  Service: Plastics;  Laterality: Bilateral;   SCAR REVISION Bilateral 04/22/2019   Procedure: Excision of bilateral scar contracture;  Surgeon: Wallace Going, DO;  Location: [place];  Service: Plastics;   Laterality: Bilateral;  total case 90 min   SENTINEL NODE BIOPSY Right 06/15/2015   Procedure: SENTINEL NODE BIOPSY;  Surgeon: Hubbard Robinson, MD;  Location: [facility];  Service: General;  Laterality: Right;   TOTAL MASTECTOMY Right 05/22/2017   Procedure: TOTAL MASTECTOMY;  Surgeon: Florene Glen, MD;  Location: [facility];  Service: General;  Laterality: Right;   TOTAL MASTECTOMY Left 02/09/2018   Procedure: LEFT PROPHYLACTIC  MASTECTOMY;  Surgeon: Jovita Kussmaul, MD;  Location: [facility];  Service: General;  Laterality: Left;   TUBAL LIGATION  2004   ULNAR NERVE TRANSPOSITION Left 01/29/2019   Procedure: LEFT ULNAR NERVE DECOMPRESSION/TRANSPOSITION;  Surgeon: Leanora Cover, MD;  Location: [place];  Service: Orthopedics;  Laterality: Left;   Patient Active Problem List   Diagnosis Date Noted   Elevated blood pressure reading in office with white coat syndrome, with diagnosis of hypertension 08/08/2021   Limited joint range of motion (ROM) 07/19/2021   Class 3 severe obesity due to excess calories with body mass index (BMI) of 45.0 to 49.9 in adult ([place]) 06/25/2021   Prediabetes 06/21/2021   Elevated ferritin level 06/21/2021   OSA on CPAP 04/20/2021   Migraine headache without aura 07/21/2020   Ulnar neuropathy 08/28/2018   S/P breast reconstruction, bilateral 06/16/2018   Acquired absence of right breast 06/01/2018   S/P mastectomy, bilateral 02/17/2018   Acquired absence of left breast 02/09/2018   Acquired absence of breast and absent nipple, right 01/02/2018   Postoperative breast asymmetry 01/02/2018   Intractable nausea and vomiting 08/11/2017   Breast cancer ([place]) 05/22/2017   Morbid (severe) obesity due to excess calories ([place]) 04/15/2017   Microcytic anemia 04/15/2017   GERD (gastroesophageal reflux disease) 04/15/2017   Chest wall mass    Malignant neoplasm of upper-outer quadrant of right female breast ([place]) 05/25/2015   HTN (hypertension) 05/24/2015     REFERRING DIAG: M25.60 (ICD-10-CM) - Limited joint range of motion (ROM)   THERAPY DIAG:  Chronic right shoulder pain   Muscle weakness (generalized)   PERTINENT HISTORY: Patient is a 51 year old female with R shoulder mobility deficits and R hand weakness. Pt reports undergoing mastectomy on her R side in 2019 and undergoing lymphedema therapy. She reports difficulty with accessing overhead motion on her R arm since then. Patient reports having to re-position arm when reaching overhead - she . She reports difficulty with her hands "locking up." Patient reports this is worse in her L hand presently. Pt has Hx of L ulnar nerve transposition in 2020 and Hx of carpel tunnel syndrome on Med Hx. She reports frequently dropping items when holding onto pen or kitchen items/spices. Patient is R hand dominant. Pt is in remission for breast cancer - since her surgery/chemo in fall of 2019. Pt reports fall off her front steps 9 months ago. Patient reports no imaging at the time; mainly integumentary injuries/scrapes. Pt has intermittent episodes of dizziness - pt describes  it as "woozy" feeling and blurry vision; pt does not describe room spinning sensation. No unexplained weight loss. Pt reports intermittent disturbed sleep due to R hip pain   PRECAUTIONS: None   OBJECTIVE: (objective measures completed at initial evaluation unless otherwise dated)     DIAGNOSTIC FINDINGS:  None for R shoulder or R upper limb recently    PATIENT SURVEYS:  FOTO 62   COGNITION:           Overall cognitive status: Within functional limits for tasks assessed                                  SENSATION: WFL   POSTURE: Rounded shoulders, anteriorly tilted scapulae, increased thoracic kyphosis   UPPER EXTREMITY ROM:    Active ROM Right 08/01/2021 Left 08/01/2021 Right 09/10/21 Left 09/10/21  Shoulder flexion 113* 130 146 136  Shoulder extension          Shoulder abduction 84* 149 153 164  Shoulder adduction           Shoulder internal rotation          Shoulder external rotation 78 83 78 84  Elbow flexion WNL WNL      Elbow extension -8 -4 -2 -3  Wrist flexion WNL WNL      Wrist extension WNL WNL      Wrist ulnar deviation WNL WNL      Wrist radial deviation WNL WNL      Wrist pronation WNL WNL      Wrist supination WNL WNL      (Blank rows = not tested)       Passive ROM Right 08/01/2021 Left 08/01/2021 Right 09/10/21  Shoulder flexion 130*   165  Shoulder extension        Shoulder abduction 90*   152  Shoulder adduction        Shoulder internal rotation [facility]      Shoulder external rotation WFL      (Blank rows = not tested) *Indicates pain     UPPER EXTREMITY MMT:   MMT Right 08/03/2021 Left 08/03/2021 Right 09/10/21 Left 09/10/21  Shoulder flexion -4 4 4+    Shoulder extension          Shoulder abduction 4 4+ 4+    Shoulder adduction          Shoulder internal rotation 4+* 5 5    Shoulder external rotation 4+ 5 4+    Middle trapezius          Lower trapezius          Elbow flexion 4+ 5 5    Elbow extension 4+ 5 5    Wrist flexion 4   5    Wrist extension 5   5    Wrist ulnar deviation 5   5    Wrist radial deviation 5   5    Wrist pronation 5*   5    Wrist supination 5   5    Grip strength (lbs) with JAMAR dynamometer (best of 3) 53.9 lb 81.3 lb 83.0 lb 82.3 lb  (Blank rows = not tested)   SHOULDER SPECIAL TESTS:            Impingement tests: Neer impingement test: positive , Hawkins/Kennedy impingement test: positive , and Painful arc test: positive  Rotator cuff assessment: Empty Can Positive, External rotation lag sign Negative   WRIST SPECIAL TESTS Tinel's sign (carpel tunnel): negative, Phalen's negative, Reverse Phalen's negative               08/06/21 ULTT's: pain with R median and ulnar nerve glide, no NT or referred pain to wrist/hand       JOINT MOBILITY TESTING:  Glenohumeral joint: Moderate restriction posterior and  inferior 08/06/21 Scapulothoracic: decreased protraction and upward rotation of scapula   PALPATION:  Tenderness to palpation along R middle deltoid, R bicipital groove, R lateral pectoralis major         TREATMENT TODAY   SUBJECTIVE: Pt reports general aching at arrival to PT affecting hands/fingers and her hips. Patient reports no significant issue with shoulder elevation recently. Patient reports          PAIN:  Are you having pain? Yes, 3/10 at arrival affecting L hip and her fingers Pain location: -      Manual Therapy   R hand dorsal and volar glides at PIPs 2-5; 2 x 30 sec each STM to R middle deltoid; x 2 minutes Supine gentle R shoulder PROM to check ROM, x1 minute Supine R shoulder GH AP mobilizations with shoulder at varying angles, grade II-III, 30s Supine R shoulder GH inferior mobilizations with shoulder at varying angles and available end range ER, grade II-III, 30s    *not performed this date*   STM/DTM to right pectoralis major, primary tenderness at insertion  Supine pectoral stretch with hands behind head and light OP at elbows, 2x30 seconds  L sidelying R scapular upward rotation mobilizations, grade III, 30s/bout x 2 bouts; Radial, ulnar and median nerve glides to test for nerve pain patient has been feeling. Discovered reproduction of symptoms with median nerve. Taught median nerve glides and performed x12 within session       Therapeutic Exercise -    Upper body ergometer, 2 minutes forward, 2 minutes backward - for tissue warm-up to improve muscle performance, improved soft tissue mobility/extensibility - subjective information gathered during this time  Full fist to full extension/abduction; x20   Standing doorway pectoral stretch, 2x30 seconds bilateral UE   Serratus slide at wall with foam roller; 2x10   Standing scaption with 4-lb Dbells; 3x8   Bilateral shoulder ER with scapular retraction; Green Tband; 2x10   Alternating horizontal  abduction, back to wall; Red Tband; 2x10 alternating R/L   Suitcase carry; 9-lb Dbell for 70-ft; 2x D/B (minimal challenge) -progressed to 25-lb plate on cord; completed 70-ft walk 1x D/B; verbal cueing and demonstration for posture and correct body mechanics with turning during this carrying drill   Nautilus standing row; 40-lb; 2x8             -tactile cueing for scapular retraction/depression     Pt edu: Discussed benefit of weightbearing exercises for maintaining bone density and performing only within limits of pain.        *not today* Standing R shoulder flexion to 90d with 3# DB 2x10; Standing R shoulder abduction to 90d with 3# DB 2x10; Seated R upper trap and levator scap stretch, x30 seconds each  Standing row BUE, GreenTB, 2x10, tactile cueing for scapular retraction/depression Seated R shoulder ER with YellowTB 2 x 10; R shoulder abduction with elbow at 90d, neutral rotation with 2# DB 2x10; Serratus protraction in modified push-up position (hands on table) 2 x 10;       PATIENT EDUCATION: Education details: Pt  educated throughout session about proper posture and technique with exercises. Improved exercise technique, movement at target joints, use of target muscles after min to mod verbal, visual, tactile cues.  Person educated: Patient Education method: Explanation and demonstration Education comprehension: verbalized understanding and returned demonstration     HOME EXERCISE PROGRAM: Access Code [email or web address]                   ASSESSMENT:   CLINICAL IMPRESSION:  Focused on active intervention today with progressive loading of affected tissues, postural re-edu/periscapular strengthening, and utilizing functional patterns and compound movements to strengthen her grip. Patient is able to perform suitcase carry with significant load simulating demands of carrying heavy bag. She demonstrates full shoulder elevation and is able to increase demands of periscapular  strengthening and progressive loading of shoulder external rotators. Patient is making excellent progress with R shoulder at this time and approaching end-phase of PT. Given history of cancer, patient fortunately had (-) CT scan results with scan on 09/14/21. Pt will benefit from continued skilled PT services to address deficits remaining in strength, range of motion, and residual intermittent pain in order to return to full function at home with anticipated D/C in next 2-3 weeks.          OBJECTIVE IMPAIRMENTS decreased ROM, decreased strength, hypomobility, impaired flexibility, impaired UE functional use, postural dysfunction, and pain.    ACTIVITY LIMITATIONS cleaning and meal prep.    PERSONAL FACTORS 3+ comorbidities: (obesity, Hx of breast cancer and mastectomy, depression, HTN, Hx of carpel tunnel syndrome)   and time since onset of pt's condition are also affecting patient's functional outcome.      REHAB POTENTIAL: Good   CLINICAL DECISION MAKING: Evolving/moderate complexity   EVALUATION COMPLEXITY: Moderate     GOALS:     SHORT TERM GOALS: Target date: 08/23/2021   Patient will be compliant and independent with given HEP as needed to augment PT intervention for carryover of in-clinic intervention  Baseline: 08/01/21: Baseline HEP initiated (see [place]).   09/10/21: Patient is compliant with progressed HEP  Goal status: ACHIEVED     LONG TERM GOALS: Target date: 09/14/2021   Patient will demonstrate improved function as evidenced by a score of 66 on FOTO measure for full participation in activities at home and in the community.   Baseline: 08/01/21: 62.  09/10/21: 67 Goal status: ACHIEVED   2.  Patient will have R shoulder flexion and abduction within 10 degrees of contralateral shoulder indicative of improved ability to elevate R upper limb as needed for functional reaching, self-care ADLs, overhead activity Baseline: 08/01/21: Shoulder AROM R/L: Flexion 113*/130,  Abduction 84*/149 (*Indicates pain).    09/10/21: Shoulder AROM R/L Flexion 146/136, Abduction 153/164.  Goal status: IN PROGRESS/MOSTLY MET   3.  Patient will improve R grip strength to at least 79.5 lbs reflective of at least having grip strength within standard error of contralateral upper limb (based on baseline measures) indicative of improved ability to perform prehensile tasks and hold items in R hand Baseline: 08/01/21: Grip strength (JAMAR): R 53.9, L 81.3.   09/10/21: Grip strength (JAMAR): R 83.0, L 82.3.  Goal status: ACHIEVED   4.  Patient will improve shoulder and elbow MMTs by at least 1/2 MMT grade indicative of improved strength as needed for ability to perform daily functional lifting tasks Baseline: 08/01/21: MMT R/L Shoulder flexion 4-/4, shoulder abduction 4/4+, shoulder ER 4+/5, shoulder IR 4+/5.  09/10/21: Met for all with exception of  shoulder ER manual muscle test unchanged.  Goal status: IN PROGRESS/MOSTLY MET     PLAN: PT FREQUENCY: 2x/week   PT DURATION: 6 weeks   PLANNED INTERVENTIONS: Therapeutic exercises, Therapeutic activity, Neuromuscular re-education, Patient/Family education, Joint mobilization, Cryotherapy, Moist heat, and Manual therapy   PLAN FOR NEXT SESSION: Progress with strengthening, scapular mobility and serratus anterior activation for overhead mobility, postural re-education. Anticipate D/C over next 2 weeks.      Valentina Gu, PT, DPT #[id number]  Eilleen Kempf, PT 09/19/2021, 9:40 AM

## 2021-09-20 ENCOUNTER — Ambulatory Visit (INDEPENDENT_AMBULATORY_CARE_PROVIDER_SITE_OTHER): Payer: Medicare Other | Admitting: Family Medicine

## 2021-09-20 ENCOUNTER — Encounter (INDEPENDENT_AMBULATORY_CARE_PROVIDER_SITE_OTHER): Payer: Self-pay | Admitting: Family Medicine

## 2021-09-20 ENCOUNTER — Ambulatory Visit (INDEPENDENT_AMBULATORY_CARE_PROVIDER_SITE_OTHER): Payer: Medicare Other | Admitting: Bariatrics

## 2021-09-20 VITALS — BP 121/80 | HR 63 | Temp 98.2°F | Ht 69.0 in | Wt 311.0 lb

## 2021-09-20 DIAGNOSIS — E669 Obesity, unspecified: Secondary | ICD-10-CM | POA: Diagnosis not present

## 2021-09-20 DIAGNOSIS — F32A Depression, unspecified: Secondary | ICD-10-CM

## 2021-09-20 DIAGNOSIS — Z6841 Body Mass Index (BMI) 40.0 and over, adult: Secondary | ICD-10-CM

## 2021-09-20 DIAGNOSIS — R7303 Prediabetes: Secondary | ICD-10-CM | POA: Diagnosis not present

## 2021-09-20 DIAGNOSIS — F339 Major depressive disorder, recurrent, unspecified: Secondary | ICD-10-CM

## 2021-09-20 MED ORDER — VORTIOXETINE HBR 10 MG PO TABS: 10.0000 mg | ORAL_TABLET | Freq: Every day | ORAL | 0 refills | Status: DC

## 2021-09-24 ENCOUNTER — Encounter: Payer: Self-pay | Admitting: Physician Assistant

## 2021-09-24 ENCOUNTER — Ambulatory Visit (INDEPENDENT_AMBULATORY_CARE_PROVIDER_SITE_OTHER): Payer: Medicare Other | Admitting: Physician Assistant

## 2021-09-24 ENCOUNTER — Encounter: Payer: Self-pay | Admitting: Physical Therapy

## 2021-09-24 ENCOUNTER — Ambulatory Visit: Payer: Medicare Other | Admitting: Physical Therapy

## 2021-09-24 DIAGNOSIS — M6281 Muscle weakness (generalized): Secondary | ICD-10-CM

## 2021-09-24 DIAGNOSIS — M7061 Trochanteric bursitis, right hip: Secondary | ICD-10-CM | POA: Diagnosis not present

## 2021-09-24 DIAGNOSIS — M7062 Trochanteric bursitis, left hip: Secondary | ICD-10-CM | POA: Diagnosis not present

## 2021-09-24 DIAGNOSIS — M25611 Stiffness of right shoulder, not elsewhere classified: Secondary | ICD-10-CM

## 2021-09-24 DIAGNOSIS — G8929 Other chronic pain: Secondary | ICD-10-CM

## 2021-09-24 DIAGNOSIS — M25511 Pain in right shoulder: Secondary | ICD-10-CM | POA: Diagnosis not present

## 2021-09-24 MED ORDER — LIDOCAINE HCL 1 % IJ SOLN
3.0000 mL | INTRAMUSCULAR | Status: AC | PRN
  Administered 2021-09-24: 3 mL

## 2021-09-24 MED ORDER — METHYLPREDNISOLONE ACETATE 40 MG/ML IJ SUSP
40.0000 mg | INTRAMUSCULAR | Status: AC | PRN
  Administered 2021-09-24: 40 mg via INTRA_ARTICULAR

## 2021-09-25 NOTE — Progress Notes (Signed)
Chief Complaint:   OBESITY Michelle Mcmillan is here to discuss her progress with her obesity treatment plan along with follow-up of her obesity related diagnoses. Michelle Mcmillan is on the Category 3 Plan with lunch options and states she is following her eating plan approximately 85% of the time. Michelle Mcmillan states she is walking and doing strength training 30 minutes 2 times per week.  Today's visit was #: 7 Starting weight: 333 lbs Starting date: 06/20/2021 Today's weight: 311 lbs Today's date: 09/20/2021 Total lbs lost to date: 22 lbs Total lbs lost since last in-office visit: 4 lbs  Interim History: Michelle Mcmillan has had some emotional non eating with recent significant medical problems.  She feels very overwhelmed with her husbands back issues and feeling poorly herself.  She has been limited in treatment for her mental health due to side effects.  Subjective:   1. Depression, recurrent ([place]) Michelle Mcmillan denies any suicidal or homicidal ideations.  She is very tearful today.  She is on Wellbutrin 150 mg daily, significant sexual side effects.  She is also on Cymbalta and Lexapro.  2. Prediabetes Michelle Mcmillan's last A1c was 6.0, she was diagnosed about 3-5 years ago.  She is making conscious choice to change eating habits.  Assessment/Plan:   1. Depression, recurrent ([place]) Reach out to heme/oncology for support group.  Start- vortioxetine HBr (TRINTELLIX) 10 MG TABS tablet; Take 1 tablet (10 mg total) by mouth daily.  Dispense: 30 tablet; Refill: 0  2. Prediabetes Check labs in August.  3. Obesity, Current BMI 45.9 Michelle Mcmillan is currently in the action stage of change. As such, her goal is to continue with weight loss efforts. She has agreed to the Category 3 Plan.   Exercise goals: All adults should avoid inactivity. Some physical activity is better than none, and adults who participate in any amount of physical activity gain some health benefits.  Behavioral modification strategies: increasing lean  protein intake, no skipping meals, and dealing with family or coworker sabotage.  Michelle Mcmillan has agreed to follow-up with our clinic in 3 weeks. She was informed of the importance of frequent follow-up visits to maximize her success with intensive lifestyle modifications for her multiple health conditions.   Objective:   Blood pressure 121/80, pulse 63, temperature 98.2 F (36.8 C), height '5\' 9"'$  (1.753 m), weight (!) 311 lb (141.1 kg), SpO2 97 %. Body mass index is 45.93 kg/m.  General: Cooperative, alert, well developed, in no acute distress. HEENT: Conjunctivae and lids unremarkable. Cardiovascular: Regular rhythm.  Lungs: Normal work of breathing. Neurologic: No focal deficits.   Lab Results  Component Value Date   CREATININE 1.03 (H) 09/06/2021   BUN 21 (H) 09/06/2021   NA 142 09/06/2021   K 4.1 09/06/2021   CL 103 09/06/2021   CO2 30 09/06/2021   Lab Results  Component Value Date   ALT 27 09/06/2021   AST 28 09/06/2021   ALKPHOS 95 09/06/2021   BILITOT 0.4 09/06/2021   Lab Results  Component Value Date   HGBA1C 6.0 (H) 06/20/2021   Lab Results  Component Value Date   INSULIN 11.8 06/20/2021   Lab Results  Component Value Date   TSH 2.020 06/20/2021   Lab Results  Component Value Date   CHOL 179 06/20/2021   HDL 65 06/20/2021   LDLCALC 105 (H) 06/20/2021   TRIG 47 06/20/2021   Lab Results  Component Value Date   VD25OH 45.2 06/20/2021   Lab Results  Component Value Date   WBC  7.1 09/06/2021   HGB 12.7 09/06/2021   HCT 41.0 09/06/2021   MCV 84.4 09/06/2021   PLT 239 09/06/2021   Lab Results  Component Value Date   IRON 49 09/06/2021   TIBC 273 09/06/2021   FERRITIN 224 09/06/2021    Obesity Behavioral Intervention:   Approximately 15 minutes were spent on the discussion below.  ASK: We discussed the diagnosis of obesity with Michelle Mcmillan today and Michelle Mcmillan agreed to give [place] permission to discuss obesity behavioral modification therapy  today.  ASSESS: Jeweliana has the diagnosis of obesity and her BMI today is 45.9. Vernessa is in the action stage of change.   ADVISE: Sekai was educated on the multiple health risks of obesity as well as the benefit of weight loss to improve her health. She was advised of the need for long term treatment and the importance of lifestyle modifications to improve her current health and to decrease her risk of future health problems.  AGREE: Multiple dietary modification options and treatment options were discussed and Elmire agreed to follow the recommendations documented in the above note.  ARRANGE: Barney was educated on the importance of frequent visits to treat obesity as outlined per CMS and USPSTF guidelines and agreed to schedule her next follow up appointment today.  Attestation Statements:   Reviewed by clinician on day of visit: allergies, medications, problem list, medical history, surgical history, family history, social history, and previous encounter notes.  I, Davy Pique, RMA, am acting as transcriptionist for Coralie Common, MD.  I have reviewed the above documentation for accuracy and completeness, and I agree with the above. - Coralie Common, MD

## 2021-09-26 ENCOUNTER — Ambulatory Visit: Payer: Medicare Other | Admitting: Physical Therapy

## 2021-10-09 ENCOUNTER — Ambulatory Visit: Payer: Medicare Other | Attending: Adult Health | Admitting: Physical Therapy

## 2021-10-09 ENCOUNTER — Encounter: Payer: Self-pay | Admitting: Physical Therapy

## 2021-10-09 DIAGNOSIS — M25511 Pain in right shoulder: Secondary | ICD-10-CM | POA: Insufficient documentation

## 2021-10-09 DIAGNOSIS — G8929 Other chronic pain: Secondary | ICD-10-CM | POA: Insufficient documentation

## 2021-10-09 DIAGNOSIS — M25551 Pain in right hip: Secondary | ICD-10-CM | POA: Diagnosis present

## 2021-10-09 DIAGNOSIS — M25552 Pain in left hip: Secondary | ICD-10-CM | POA: Diagnosis present

## 2021-10-09 DIAGNOSIS — R262 Difficulty in walking, not elsewhere classified: Secondary | ICD-10-CM | POA: Insufficient documentation

## 2021-10-09 DIAGNOSIS — M25611 Stiffness of right shoulder, not elsewhere classified: Secondary | ICD-10-CM | POA: Diagnosis present

## 2021-10-09 DIAGNOSIS — M6281 Muscle weakness (generalized): Secondary | ICD-10-CM | POA: Insufficient documentation

## 2021-10-09 NOTE — Therapy (Unsigned)
OUTPATIENT PHYSICAL THERAPY TREATMENT NOTE/GOAL UPDATE   Patient Name: Michelle Mcmillan MRN: [id number] DOB:Jul 31, 1970, 51 y.o., female Today's Date: 10/09/2021  PCP: Donnie Coffin, MD REFERRING PROVIDER: Esaw Grandchild, NP  END OF SESSION:    Past Medical History:  Diagnosis Date   Anemia    H/O   Anemia    Arthritis    RIGHT KNEE   Back pain    Breast cancer ([place]) 2017   right breast   Cancer ([place])    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)   Carpal tunnel syndrome    Constipation    Depression    Dyspnea    GERD (gastroesophageal reflux disease)    NO MEDS   Headache    migraines   Hypertension    Joint pain    Last menstrual period (LMP) > 10 days ago 08/2015   Lower extremity edema    Migraines    Neuropathy    OSA (obstructive sleep apnea)    Personal history of radiation therapy    Prediabetes    SOB (shortness of breath)    Past Surgical History:  Procedure Laterality Date   BREAST BIOPSY Right 05/08/2017   invasive mamm. ca   BREAST EXCISIONAL BIOPSY Right 06/15/15   breast cancer   BREAST LUMPECTOMY WITH NEEDLE LOCALIZATION Right 06/15/2015   Procedure: BREAST LUMPECTOMY WITH NEEDLE LOCALIZATION;  Surgeon: Hubbard Robinson, MD;  Location: [facility];  Service: General;  Laterality: Right;   BREAST RECONSTRUCTION WITH PLACEMENT OF TISSUE EXPANDER AND FLEX HD (ACELLULAR HYDRATED DERMIS) Left 02/09/2018   Procedure: BREAST RECONSTRUCTION WITH PLACEMENT OF TISSUE EXPANDER AND FLEX HD (ACELLULAR HYDRATED DERMIS);  Surgeon: Wallace Going, DO;  Location: [facility];  Service: Plastics;  Laterality: Left;   BREAST SURGERY     CESAREAN SECTION  1988   x1   COLONOSCOPY WITH PROPOFOL N/A 01/23/2017   Procedure: COLONOSCOPY WITH PROPOFOL;  Surgeon: Lin Landsman, MD;  Location: [facility]COPY;  Service: Gastroenterology;  Laterality: N/A;   ESOPHAGOGASTRODUODENOSCOPY (EGD) WITH PROPOFOL N/A 01/23/2017   Procedure: ESOPHAGOGASTRODUODENOSCOPY (EGD) WITH PROPOFOL;   Surgeon: Lin Landsman, MD;  Location: [facility] ENDOSCOPY;  Service: Gastroenterology;  Laterality: N/A;   LATISSIMUS FLAP TO BREAST Right 06/01/2018   Procedure: RIGHT BREAST RECONSTRUCTION WITH LATISSIMUS MYOCUTANEOUS FLAP;  Surgeon: Wallace Going, DO;  Location: [place];  Service: Plastics;  Laterality: Right;   LIPOSUCTION WITH LIPOFILLING Bilateral 10/15/2018   Procedure: LIPOSUCTION TO BILATERAL BREASTS;  Surgeon: Wallace Going, DO;  Location: [place];  Service: Plastics;  Laterality: Bilateral;   LIPOSUCTION WITH LIPOFILLING Bilateral 04/22/2019   Procedure: fat grafting/liposuction and lipofilling bilateral breasts;  Surgeon: Wallace Going, DO;  Location: [place];  Service: Plastics;  Laterality: Bilateral;   MASS EXCISION N/A 05/20/2016   Procedure: EXCISION CHEST WALL MASS;  Surgeon: Florene Glen, MD;  Location: [facility];  Service: General;  Laterality: N/A;   RECONSTRUCTION BREAST W/ LATISSIMUS DORSI FLAP Right 06/01/2018   REMOVAL OF BILATERAL TISSUE EXPANDERS WITH PLACEMENT OF BILATERAL BREAST IMPLANTS Bilateral 10/15/2018   Procedure: REMOVAL OF BILATERAL TISSUE EXPANDERS WITH PLACEMENT OF BILATERAL BREAST IMPLANTS;  Surgeon: Wallace Going, DO;  Location: [place];  Service: Plastics;  Laterality: Bilateral;   SCAR REVISION Bilateral 04/22/2019   Procedure: Excision of bilateral scar contracture;  Surgeon: Wallace Going, DO;  Location: [place];  Service: Plastics;  Laterality: Bilateral;  total case 90 min  SENTINEL NODE BIOPSY Right 06/15/2015   Procedure: SENTINEL NODE BIOPSY;  Surgeon: Hubbard Robinson, MD;  Location: [facility];  Service: General;  Laterality: Right;   TOTAL MASTECTOMY Right 05/22/2017   Procedure: TOTAL MASTECTOMY;  Surgeon: Florene Glen, MD;  Location: [facility];  Service: General;  Laterality: Right;   TOTAL MASTECTOMY Left 02/09/2018   Procedure: LEFT  PROPHYLACTIC  MASTECTOMY;  Surgeon: Jovita Kussmaul, MD;  Location: [facility];  Service: General;  Laterality: Left;   TUBAL LIGATION  2004   ULNAR NERVE TRANSPOSITION Left 01/29/2019   Procedure: LEFT ULNAR NERVE DECOMPRESSION/TRANSPOSITION;  Surgeon: Leanora Cover, MD;  Location: [place];  Service: Orthopedics;  Laterality: Left;   Patient Active Problem List   Diagnosis Date Noted   Elevated blood pressure reading in office with white coat syndrome, with diagnosis of hypertension 08/08/2021   Limited joint range of motion (ROM) 07/19/2021   Class 3 severe obesity due to excess calories with body mass index (BMI) of 45.0 to 49.9 in adult ([place]) 06/25/2021   Prediabetes 06/21/2021   Elevated ferritin level 06/21/2021   OSA on CPAP 04/20/2021   Migraine headache without aura 07/21/2020   Ulnar neuropathy 08/28/2018   S/P breast reconstruction, bilateral 06/16/2018   Acquired absence of right breast 06/01/2018   S/P mastectomy, bilateral 02/17/2018   Acquired absence of left breast 02/09/2018   Acquired absence of breast and absent nipple, right 01/02/2018   Postoperative breast asymmetry 01/02/2018   Intractable nausea and vomiting 08/11/2017   Breast cancer ([place]) 05/22/2017   Morbid (severe) obesity due to excess calories ([place]) 04/15/2017   Microcytic anemia 04/15/2017   GERD (gastroesophageal reflux disease) 04/15/2017   Chest wall mass    Malignant neoplasm of upper-outer quadrant of right female breast ([place]) 05/25/2015   HTN (hypertension) 05/24/2015      REFERRING DIAG: M25.60 (ICD-10-CM) - Limited joint range of motion (ROM)   THERAPY DIAG:  Chronic right shoulder pain   Muscle weakness (generalized)   PERTINENT HISTORY: Patient is a 51 year old female with R shoulder mobility deficits and R hand weakness. Pt reports undergoing mastectomy on her R side in 2019 and undergoing lymphedema therapy. She reports difficulty with accessing overhead motion on her R  arm since then. Patient reports having to re-position arm when reaching overhead - she . She reports difficulty with her hands "locking up." Patient reports this is worse in her L hand presently. Pt has Hx of L ulnar nerve transposition in 2020 and Hx of carpel tunnel syndrome on Med Hx. She reports frequently dropping items when holding onto pen or kitchen items/spices. Patient is R hand dominant. Pt is in remission for breast cancer - since her surgery/chemo in fall of 2019. Pt reports fall off her front steps 9 months ago. Patient reports no imaging at the time; mainly integumentary injuries/scrapes. Pt has intermittent episodes of dizziness - pt describes it as "woozy" feeling and blurry vision; pt does not describe room spinning sensation. No unexplained weight loss. Pt reports intermittent disturbed sleep due to R hip pain   PRECAUTIONS: None   OBJECTIVE: (objective measures completed at initial evaluation unless otherwise dated)     DIAGNOSTIC FINDINGS:  None for R shoulder or R upper limb recently    PATIENT SURVEYS:  FOTO 62   COGNITION:           Overall cognitive status: Within functional limits for tasks assessed  SENSATION: WFL   POSTURE: Rounded shoulders, anteriorly tilted scapulae, increased thoracic kyphosis   UPPER EXTREMITY ROM:    Active ROM Right 08/01/2021 Left 08/01/2021 Right 09/10/21 Left 09/10/21 Right 10/09/21 Left 10/09/21  Shoulder flexion 113* 130 146 136 145 148  Shoulder extension            Shoulder abduction 84* 149 153 164 170 171  Shoulder adduction            Shoulder internal rotation            Shoulder external rotation 78 83 78 84 83 84  Elbow flexion WNL WNL        Elbow extension -8 -4 -2 -3 -2 -3  Wrist flexion WNL WNL        Wrist extension WNL WNL        Wrist ulnar deviation WNL WNL        Wrist radial deviation WNL WNL        Wrist pronation WNL WNL        Wrist supination WNL WNL        (Blank rows =  not tested)       Passive ROM Right 08/01/2021 Left 08/01/2021 Right 09/10/21  Shoulder flexion 130*   165  Shoulder extension        Shoulder abduction 90*   152  Shoulder adduction        Shoulder internal rotation [facility]      Shoulder external rotation WFL      (Blank rows = not tested) *Indicates pain     UPPER EXTREMITY MMT:   MMT Right 08/03/2021 Left 08/03/2021 Right 09/10/21 Left 09/10/21 Right 10/09/21  Shoulder flexion -4 4 4+   5  Shoulder extension           Shoulder abduction 4 4+ 4+   5  Shoulder adduction           Shoulder internal rotation 4+* 5 5   5   Shoulder external rotation 4+ 5 4+   4+  Middle trapezius           Lower trapezius           Elbow flexion 4+ 5 5  5   Elbow extension 4+ 5 5  5   Wrist flexion 4   5  5   Wrist extension 5   5  5   Wrist ulnar deviation 5   5  5   Wrist radial deviation 5   5  5   Wrist pronation 5*   5  5  Wrist supination 5   5     Grip strength (lbs) with JAMAR dynamometer (best of 3) 53.9 lb 81.3 lb 83.0 lb 82.3 lb   (Blank rows = not tested)   SHOULDER SPECIAL TESTS:            Impingement tests: Neer impingement test: positive , Hawkins/Kennedy impingement test: positive , and Painful arc test: positive                 Rotator cuff assessment: Empty Can Positive, External rotation lag sign Negative   WRIST SPECIAL TESTS Tinel's sign (carpel tunnel): negative, Phalen's negative, Reverse Phalen's negative               08/06/21 ULTT's: pain with R median and ulnar nerve glide, no NT or referred pain to wrist/hand       JOINT MOBILITY TESTING:  Glenohumeral joint: Moderate restriction posterior and inferior 08/06/21 Scapulothoracic: decreased  protraction and upward rotation of scapula   PALPATION:  Tenderness to palpation along R middle deltoid, R bicipital groove, R lateral pectoralis major         TREATMENT TODAY   SUBJECTIVE: Patient reports pain in her R arm with significant time spent lying when she was sick due  to COVID-19 infection. Patient reports minimal pain this afternoon. Patient reports it was bothering her yesterday - she thinks she may have slept on it wrong. Patient reports doing well with household ADLs. Patient reports no pain at arrival. 95% SANE score. Pt reports intermittently dropping items with her R hand, but not as often.          PAIN:  Are you having pain? No Pain location: -      Manual Therapy      *not performed this date* R hand dorsal and volar glides at PIPs 2-5; 2 x 30 sec each STM to R middle deltoid; x 2 minutes Supine gentle R shoulder PROM to check ROM, x1 minute Supine R shoulder GH AP mobilizations with shoulder at varying angles, grade II-III, 30s Supine R shoulder GH inferior mobilizations with shoulder at varying angles and available end range ER, grade II-III, 30s   STM/DTM to right pectoralis major, primary tenderness at insertion  Supine pectoral stretch with hands behind head and light OP at elbows, 2x30 seconds  L sidelying R scapular upward rotation mobilizations, grade III, 30s/bout x 2 bouts; Radial, ulnar and median nerve glides to test for nerve pain patient has been feeling. Discovered reproduction of symptoms with median nerve. Taught median nerve glides and performed x12 within session       Therapeutic Exercise -    Upper body ergometer, 2 minutes forward, 2 minutes backward - for tissue warm-up to improve muscle performance, improved soft tissue mobility/extensibility - subjective information gathered during this time     Serratus slide at wall with foam roller and Red Tband around wrists for posterior cuff activation; 2x8   Standing scaption with 5-lb Dbells; 2x8   Bilateral shoulder ER with scapular retraction; Green Tband; 2x12   Bent-over row, staggered stance next to raised treatment tabe; 2x10, 5-lb Dbell   Nautilus standing row; 40-lb; 2x10             -tactile cueing for scapular retraction/depression   Alternating  horizontal abduction, back to wall; Green Tband; 2x10 alternating R/L     Patient education: Reviewed UT/LS stretches and discussed use of thermotherapy and strategies for self-DTM/TPR to use at home for pain relief. Discussed f/u with physician and considering PT referral if L paracervical pain isn't resolving on its own.        *not today* Suitcase carry; 9-lb Dbell for 70-ft; 2x D/B (minimal challenge) -progressed to 25-lb plate on cord; completed 70-ft walk 1x D/B; verbal cueing and demonstration for posture and correct body mechanics with turning during this carrying drill  Standing doorway pectoral stretch, 2x30 seconds bilateral UE  Full fist to full extension/abduction; x20 (digit AROM) Standing R shoulder flexion to 90d with 3# DB 2x10; Standing R shoulder abduction to 90d with 3# DB 2x10; Seated R upper trap and levator scap stretch, x30 seconds each  Standing row BUE, GreenTB, 2x10, tactile cueing for scapular retraction/depression Seated R shoulder ER with YellowTB 2 x 10; R shoulder abduction with elbow at 90d, neutral rotation with 2# DB 2x10; Serratus protraction in modified push-up position (hands on table) 2 x 10;  PATIENT EDUCATION: Education details: Pt educated throughout session about proper posture and technique with exercises. Improved exercise technique, movement at target joints, use of target muscles after min to mod verbal, visual, tactile cues.  Person educated: Patient Education method: Explanation and demonstration Education comprehension: verbalized understanding and returned demonstration     HOME EXERCISE PROGRAM: Access Code [email or web address]                   ASSESSMENT:   CLINICAL IMPRESSION:  Patient demonstrates good overhead mobility at this time and is progressing well with advanced strengthening drills and additional periscapular strengthening. Pt is making excellent progress with PT for R shoulder pain and mobility deficits as well as R wrist  and grip strength deficits. Pt has met majority of goals previously and is approaching readiness for D/C with focus on end-phase rehab over last 2 weeks. Pt does have comorbid L paracervical pain that started one morning upon waking - this has improved, but it is remaining today. Pt has responded well with stretching at home. Discussed simple techniques today for pain relief and self-DTM/TPR. Pt may return for follow-up on neck pain pending PT referral if this is not resolving on its own over next 2 weeks. Will update remaining goals next visit and anticipate discharge for this episode of care following re-assessment.          OBJECTIVE IMPAIRMENTS decreased ROM, decreased strength, hypomobility, impaired flexibility, impaired UE functional use, postural dysfunction, and pain.    ACTIVITY LIMITATIONS cleaning and meal prep.    PERSONAL FACTORS 3+ comorbidities: (obesity, Hx of breast cancer and mastectomy, depression, HTN, Hx of carpel tunnel syndrome)   and time since onset of pt's condition are also affecting patient's functional outcome.      REHAB POTENTIAL: Good   CLINICAL DECISION MAKING: Evolving/moderate complexity   EVALUATION COMPLEXITY: Moderate     GOALS:     SHORT TERM GOALS: Target date: 08/23/2021   Patient will be compliant and independent with given HEP as needed to augment PT intervention for carryover of in-clinic intervention  Baseline: 08/01/21: Baseline HEP initiated (see [place]).   09/10/21: Patient is compliant with progressed HEP  Goal status: ACHIEVED     LONG TERM GOALS: Target date: 09/14/2021   Patient will demonstrate improved function as evidenced by a score of 66 on FOTO measure for full participation in activities at home and in the community.   Baseline: 08/01/21: 62.  09/10/21: 67 Goal status: ACHIEVED   2.  Patient will have R shoulder flexion and abduction within 10 degrees of contralateral shoulder indicative of improved ability to  elevate R upper limb as needed for functional reaching, self-care ADLs, overhead activity Baseline: 08/01/21: Shoulder AROM R/L: Flexion 113*/130, Abduction 84*/149 (*Indicates pain).    09/10/21: Shoulder AROM R/L Flexion 146/136, Abduction 153/164.  Goal status: ACHIEVED   3.  Patient will improve R grip strength to at least 79.5 lbs reflective of at least having grip strength within standard error of contralateral upper limb (based on baseline measures) indicative of improved ability to perform prehensile tasks and hold items in R hand Baseline: 08/01/21: Grip strength (JAMAR): R 53.9, L 81.3.   09/10/21: Grip strength (JAMAR): R 83.0, L 82.3.  Goal status: ACHIEVED   4.  Patient will improve shoulder and elbow MMTs by at least 1/2 MMT grade indicative of improved strength as needed for ability to perform daily functional lifting tasks Baseline: 08/01/21: MMT R/L Shoulder flexion 4-/4, shoulder  abduction 4/4+, shoulder ER 4+/5, shoulder IR 4+/5.  09/10/21: Met for all with exception of shoulder ER manual muscle test unchanged.  Goal status: IN PROGRESS/MOSTLY MET     PLAN: PT FREQUENCY: 2x/week   PT DURATION: 6 weeks   PLANNED INTERVENTIONS: Therapeutic exercises, Therapeutic activity, Neuromuscular re-education, Patient/Family education, Joint mobilization, Cryotherapy, Moist heat, and Manual therapy   PLAN FOR NEXT SESSION: Progress with strengthening, scapular mobility and serratus anterior activation for overhead mobility, postural re-education. Anticipate D/C following goal update next visit.        Valentina Gu, PT, DPT #[id number]  Eilleen Kempf, PT 10/09/2021, 4:05 PM

## 2021-10-15 ENCOUNTER — Encounter (INDEPENDENT_AMBULATORY_CARE_PROVIDER_SITE_OTHER): Payer: Self-pay | Admitting: Family Medicine

## 2021-10-15 ENCOUNTER — Ambulatory Visit (INDEPENDENT_AMBULATORY_CARE_PROVIDER_SITE_OTHER): Payer: Medicare Other | Admitting: Family Medicine

## 2021-10-15 VITALS — BP 115/78 | HR 80 | Temp 98.4°F | Ht 69.0 in | Wt 313.0 lb

## 2021-10-15 DIAGNOSIS — R7303 Prediabetes: Secondary | ICD-10-CM

## 2021-10-15 DIAGNOSIS — Z6841 Body Mass Index (BMI) 40.0 and over, adult: Secondary | ICD-10-CM | POA: Diagnosis not present

## 2021-10-15 DIAGNOSIS — E669 Obesity, unspecified: Secondary | ICD-10-CM

## 2021-10-15 DIAGNOSIS — E66813 Obesity, class 3: Secondary | ICD-10-CM

## 2021-10-15 DIAGNOSIS — F3289 Other specified depressive episodes: Secondary | ICD-10-CM

## 2021-10-16 ENCOUNTER — Ambulatory Visit: Payer: Medicare Other | Admitting: Physical Therapy

## 2021-10-16 ENCOUNTER — Encounter: Payer: Self-pay | Admitting: Physical Therapy

## 2021-10-16 DIAGNOSIS — M25511 Pain in right shoulder: Secondary | ICD-10-CM | POA: Diagnosis not present

## 2021-10-16 DIAGNOSIS — R262 Difficulty in walking, not elsewhere classified: Secondary | ICD-10-CM

## 2021-10-16 DIAGNOSIS — M25552 Pain in left hip: Secondary | ICD-10-CM

## 2021-10-16 DIAGNOSIS — M25551 Pain in right hip: Secondary | ICD-10-CM

## 2021-10-16 DIAGNOSIS — M6281 Muscle weakness (generalized): Secondary | ICD-10-CM

## 2021-10-16 NOTE — Therapy (Signed)
OUTPATIENT PHYSICAL THERAPY LOWER EXTREMITY EVALUATION   Patient Name: Michelle Mcmillan MRN: [id number] DOB:08/01/1970, 51 y.o., female Today's Date: 10/17/2021   PT End of Session - 10/17/21 1516     Visit Number 1    Number of Visits 13    Date for PT Re-Evaluation 11/29/21    Authorization Type UHC Medicare/Medicaid, VL based on medical necessity    Progress Note Due on Visit 10    PT Start Time 1352    PT Stop Time 1430    PT Time Calculation (min) 38 min    Activity Tolerance Patient tolerated treatment well    Behavior During Therapy WFL for tasks assessed/performed             Past Medical History:  Diagnosis Date   Anemia    H/O   Anemia    Arthritis    RIGHT KNEE   Back pain    Breast cancer ([place]) 2017   right breast   Cancer ([place])    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)   Carpal tunnel syndrome    Constipation    Depression    Dyspnea    GERD (gastroesophageal reflux disease)    NO MEDS   Headache    migraines   Hypertension    Joint pain    Last menstrual period (LMP) > 10 days ago 08/2015   Lower extremity edema    Migraines    Neuropathy    OSA (obstructive sleep apnea)    Personal history of radiation therapy    Prediabetes    SOB (shortness of breath)    Past Surgical History:  Procedure Laterality Date   BREAST BIOPSY Right 05/08/2017   invasive mamm. ca   BREAST EXCISIONAL BIOPSY Right 06/15/15   breast cancer   BREAST LUMPECTOMY WITH NEEDLE LOCALIZATION Right 06/15/2015   Procedure: BREAST LUMPECTOMY WITH NEEDLE LOCALIZATION;  Surgeon: Hubbard Robinson, MD;  Location: [facility];  Service: General;  Laterality: Right;   BREAST RECONSTRUCTION WITH PLACEMENT OF TISSUE EXPANDER AND FLEX HD (ACELLULAR HYDRATED DERMIS) Left 02/09/2018   Procedure: BREAST RECONSTRUCTION WITH PLACEMENT OF TISSUE EXPANDER AND FLEX HD (ACELLULAR HYDRATED DERMIS);  Surgeon: Wallace Going, DO;  Location: [facility];  Service: Plastics;  Laterality: Left;   BREAST SURGERY      CESAREAN SECTION  1988   x1   COLONOSCOPY WITH PROPOFOL N/A 01/23/2017   Procedure: COLONOSCOPY WITH PROPOFOL;  Surgeon: Lin Landsman, MD;  Location: [facility];  Service: Gastroenterology;  Laterality: N/A;   ESOPHAGOGASTRODUODENOSCOPY (EGD) WITH PROPOFOL N/A 01/23/2017   Procedure: ESOPHAGOGASTRODUODENOSCOPY (EGD) WITH PROPOFOL;  Surgeon: Lin Landsman, MD;  Location: [facility] ENDOSCOPY;  Service: Gastroenterology;  Laterality: N/A;   LATISSIMUS FLAP TO BREAST Right 06/01/2018   Procedure: RIGHT BREAST RECONSTRUCTION WITH LATISSIMUS MYOCUTANEOUS FLAP;  Surgeon: Wallace Going, DO;  Location: [place];  Service: Plastics;  Laterality: Right;   LIPOSUCTION WITH LIPOFILLING Bilateral 10/15/2018   Procedure: LIPOSUCTION TO BILATERAL BREASTS;  Surgeon: Wallace Going, DO;  Location: [place];  Service: Plastics;  Laterality: Bilateral;   LIPOSUCTION WITH LIPOFILLING Bilateral 04/22/2019   Procedure: fat grafting/liposuction and lipofilling bilateral breasts;  Surgeon: Wallace Going, DO;  Location: [place];  Service: Plastics;  Laterality: Bilateral;   MASS EXCISION N/A 05/20/2016   Procedure: EXCISION CHEST WALL MASS;  Surgeon: Florene Glen, MD;  Location: [facility];  Service: General;  Laterality: N/A;   RECONSTRUCTION BREAST W/ LATISSIMUS DORSI FLAP Right  06/01/2018   REMOVAL OF BILATERAL TISSUE EXPANDERS WITH PLACEMENT OF BILATERAL BREAST IMPLANTS Bilateral 10/15/2018   Procedure: REMOVAL OF BILATERAL TISSUE EXPANDERS WITH PLACEMENT OF BILATERAL BREAST IMPLANTS;  Surgeon: Wallace Going, DO;  Location: [place];  Service: Plastics;  Laterality: Bilateral;   SCAR REVISION Bilateral 04/22/2019   Procedure: Excision of bilateral scar contracture;  Surgeon: Wallace Going, DO;  Location: [place];  Service: Plastics;  Laterality: Bilateral;  total case 90 min   SENTINEL NODE BIOPSY Right  06/15/2015   Procedure: SENTINEL NODE BIOPSY;  Surgeon: Hubbard Robinson, MD;  Location: [facility];  Service: General;  Laterality: Right;   TOTAL MASTECTOMY Right 05/22/2017   Procedure: TOTAL MASTECTOMY;  Surgeon: Florene Glen, MD;  Location: [facility];  Service: General;  Laterality: Right;   TOTAL MASTECTOMY Left 02/09/2018   Procedure: LEFT PROPHYLACTIC  MASTECTOMY;  Surgeon: Jovita Kussmaul, MD;  Location: [facility];  Service: General;  Laterality: Left;   TUBAL LIGATION  2004   ULNAR NERVE TRANSPOSITION Left 01/29/2019   Procedure: LEFT ULNAR NERVE DECOMPRESSION/TRANSPOSITION;  Surgeon: Leanora Cover, MD;  Location: [place];  Service: Orthopedics;  Laterality: Left;   Patient Active Problem List   Diagnosis Date Noted   Elevated blood pressure reading in office with white coat syndrome, with diagnosis of hypertension 08/08/2021   Limited joint range of motion (ROM) 07/19/2021   Class 3 severe obesity due to excess calories with body mass index (BMI) of 45.0 to 49.9 in adult ([place]) 06/25/2021   Prediabetes 06/21/2021   Elevated ferritin level 06/21/2021   OSA on CPAP 04/20/2021   Migraine headache without aura 07/21/2020   Ulnar neuropathy 08/28/2018   S/P breast reconstruction, bilateral 06/16/2018   Acquired absence of right breast 06/01/2018   S/P mastectomy, bilateral 02/17/2018   Acquired absence of left breast 02/09/2018   Acquired absence of breast and absent nipple, right 01/02/2018   Postoperative breast asymmetry 01/02/2018   Intractable nausea and vomiting 08/11/2017   Breast cancer ([place]) 05/22/2017   Morbid (severe) obesity due to excess calories ([place]) 04/15/2017   Microcytic anemia 04/15/2017   GERD (gastroesophageal reflux disease) 04/15/2017   Chest wall mass    Malignant neoplasm of upper-outer quadrant of right female breast ([place]) 05/25/2015   HTN (hypertension) 05/24/2015    PCP: Donnie Coffin, MD  REFERRING PROVIDER: Donnie Coffin,  MD  REFERRING DIAG:  M70.62 (ICD-10-CM) - Trochanteric bursitis of left hip  M70.61 (ICD-10-CM) - Trochanteric bursitis, right hip    THERAPY DIAG:  Pain in right hip  Pain in left hip  Difficulty in walking, not elsewhere classified  Muscle weakness (generalized)  Rationale for Evaluation and Treatment Rehabilitation  ONSET DATE: 04/18/2021  SUBJECTIVE:   SUBJECTIVE STATEMENT: Patient is a 51 year old female with complaint of bilateral hip pain, more pain on R at this time due to Hx of injection in L hip. Referring diagnosis of bilateral trochanteric bursitis.   PERTINENT HISTORY: Patient is a 51 year old female with complaint of bilateral hip pain, more pain on R at this time due to Hx of injection in L hip. Pt had greater trochanteric injection on her L side on 09/24/21 - this has helped with tolerance of walking. Patient reports moderate referral to her R lateral thigh proximally. Pt denies paresthesias or numbness in lower body. Pt reports pain along posterolateral gluteal region to greater trochanter R>L side. Pt reports that symptoms began about 4-6 months  ago and has progressively worsened. Pt reports fall last year without Fx or major injury ("lot of soreness"). Pt reports intermittent disturbed sleep with turning on her side. Patient reports no bowel/bladder changes. Patient reports hx of sciatica when she was pregnant, though not in recent years. Pt reports numbness with prolonged sitting LLE. Hx of varicose veins.   PAIN:  Are you having pain? Yes: NPRS scale: 3/10, 6-7/10 at worst, 0/10 at best Pain location: R>L greater trochanteric region to posterolateral gluteal region  Pain description: sharp, throbbing, aching  Aggravating factors: walking (volume dependent), sidelying on hip, prolonged sitting (numbness LLE) Relieving factors: sitting with feet flat, feet elevated   PRECAUTIONS: None  WEIGHT BEARING RESTRICTIONS No  FALLS:  Has patient fallen in last 6  months? No, some near-falls per patient   LIVING ENVIRONMENT: Lives with: lives with their spouse Lives in: House/apartment Stairs: Yes: External: 3 steps to get in through garage (step-to pattern to get in used by patient)    OCCUPATION: In school for computer work/cyber-security; sometimes pt has difficulty accessing campus  PLOF: [place] to complete daily function without pain e.g. Walking, sleeping, turning over in bed without pain. Pt would like to return to her spin bike.    OBJECTIVE:   DIAGNOSTIC FINDINGS:  CT Scan Chest, abomen, pelvis: negative for metastatic disease, mild degenerative change of bilateral hips.   DXA Bone density scan: T-score of 0.7   PATIENT SURVEYS:  FOTO 47 , predicted goal score of 57  COGNITION:  Overall cognitive status: Within functional limits for tasks assessed     SENSATION: WFL  POSTURE: Overpronation bilat feet, genu valgum  PALPATION: Tenderness to palpation along R>L greater trochanter, gluteus medius   LOWER EXTREMITY ROM:  Lumbar spine AROM WNL with exception of significant trunk rotation motion loss R and L; back pain with extension, hip pain with trunk rotation in either direction   Active ROM Right eval Left eval  Hip flexion 95 95  Hip extension    Hip abduction 20* 40*  Hip adduction    Hip internal rotation 28 30  Hip external rotation 25 22  Knee flexion    Knee extension    Ankle dorsiflexion    Ankle plantarflexion    Ankle inversion    Ankle eversion     (Blank rows = not tested)  LOWER EXTREMITY MMT:  MMT Right eval Left eval  Hip flexion 5 5  Hip extension    Hip abduction 4-* 4  Hip adduction 5 5  Hip internal rotation 4+ 5  Hip external rotation 4+ 4+  Knee flexion 5 5  Knee extension 5 5  Ankle dorsiflexion    Ankle plantarflexion    Ankle inversion    Ankle eversion     (Blank rows = not tested)  LOWER EXTREMITY SPECIAL TESTS:  Hip special tests: Saralyn Pilar  (FABER) test: positive  and Hip scouring test: positive , Straight leg raise: Negative bilaterally   GAIT: Comments: mild compensated Trendelenburg R>L     TODAY'S TREATMENT:   Therapeutic Exercise - for HEP establishment, discussion on appropriate exercise/activity modification, PT education   Reviewed baseline home exercise (supine piriformis stretching bilaterally) and provided handout for [employer] program (see Access Code); tactile cueing and therapist demonstration utilized as needed for carryover of proper technique to HEP.    Patient education on current condition, anatomy involved, prognosis, plan of care. Discussion on activity modification to prevent flare-up of condition, including  use of ice frequently at home.     PATIENT EDUCATION:  Education details: Education details: see above for patient education details  Person educated: Patient Education method: Explanation, Demonstration, and Handouts Education comprehension: verbalized understanding and returned demonstration   HOME EXERCISE PROGRAM: Access Code [email or web address]  ASSESSMENT:  CLINICAL IMPRESSION: Patient is a 51 y.o. female with primary complaint of R>L hip pain. Pain was previously worse on L, but this has improved following recent trochanteric steroid injection. Patient's clinical presentation is consistent with greater trochanteric pain syndrome; pt does have known mild degenerative changes in hips per recent CT scan. Pt has current activity limitations in walking, sidelying/positioning in bed and while lying, sitting. Associated impairments in bilateral hip ROM, hip abductor strength, gait changes, local nociceptive lateral hip pain. Pt will benefit from skilled PT services to address the above deficits and improve function.  OBJECTIVE IMPAIRMENTS Abnormal gait, difficulty walking, decreased ROM, decreased strength, hypomobility, and pain.   ACTIVITY LIMITATIONS sitting, standing, squatting, sleeping, stairs,  transfers, bed mobility, and locomotion level  PARTICIPATION LIMITATIONS: shopping and community activity  PERSONAL FACTORS Time since onset of injury/illness/exacerbation and 3+ comorbidities: (obesity, HTN, depression, Hx breast cancer, neuropathy)  are also affecting patient's functional outcome.   REHAB POTENTIAL: Good  CLINICAL DECISION MAKING: Evolving/moderate complexity  EVALUATION COMPLEXITY: Moderate   GOALS:  SHORT TERM GOALS: Target date: 11/07/2021  Patient will be independent and compliant with HEP as needed for symptom modulation, mobility, and strength gains to augment in-clinic PT intervention and improve pt function  Baseline: 10/16/21: Baseline HEP initiated Goal status: INITIAL   LONG TERM GOALS: Target date: 11/29/2021   Patient will demonstrate improved function as evidenced by a score of 57 on FOTO measure for full participation in activities at home and in the community. Baseline: 10/16/21 47 Goal status: INITIAL  2.  Patient will report NPRS no greater than 2-3/10 with daily activities as needed for improved ability to perform self-care, access community, and participate in desired life roles  Baseline: 718/23: NPRS 6-7/10 at worst  Goal status: INITIAL  3.  Patient will have MMT 5/5 for all tested hip motions indicative of improved strength as needed for improved tolerance to loading affected tissues during prolonged weightbearing and closed-chain work Baseline: 10/16/21: MMT R/L Hip abduction 4-/4, Hip IR 4+/5, Hip ER 4+/4+ Goal status: INITIAL  4.  Patient will sleep through the night without disturbed sleep secondary to hip pain on either side as needed for improved sleep quality and long-term wellness Baseline: 10/16/21: Intermittent disturbed sleep when rolling onto R more so than L side Goal status: INITIAL  5.  Patient will complete errands in town including ambulating around grocery store/shopping center without increase in pain > 2/10 indicative of  community-level mobility without increase in hip symptoms Baseline: 10/16/21: Notable pain with walking, pain worsens with higher volume of ambulatory activity.  Goal status: INITIAL    PLAN: PT FREQUENCY: 2x/week  PT DURATION: 6 weeks  PLANNED INTERVENTIONS: Therapeutic exercises, Therapeutic activity, Neuromuscular re-education, Balance training, Gait training, Patient/Family education, Self Care, Joint mobilization, Dry Needling, Electrical stimulation, Cryotherapy, Moist heat, and Manual therapy  PLAN FOR NEXT SESSION: Manual therapy for hip mobility and distraction techniques to modulate hip pain, continued stretching and icing for patient's HEP, initiate hip abductor/adductor isometrics next visit     Valentina Gu, PT, DPT #[id number]  Eilleen Kempf, PT 10/17/2021, 3:18 PM

## 2021-10-17 NOTE — Progress Notes (Signed)
Chief Complaint:   OBESITY Michelle Mcmillan is here to discuss her progress with her obesity treatment plan along with follow-up of her obesity related diagnoses. Michelle Mcmillan is on the Category 3 Plan and states she is following her eating plan approximately 80% of the time. Michelle Mcmillan states she is exercising 0 minutes 0 times per week.  Today's visit was #: 8 Starting weight: 333 lbs Starting date: 06/20/2021 Today's weight: 313 lbs Today's date: 10/15/2021 Total lbs lost to date: 20 lbs Total lbs lost since last in-office visit: 0  Interim History: Michelle Mcmillan has had COVID since last appointment (symptoms for 4 days). She was doing well on Cat 3 prior to that. She has a couple of trips planned coming up ---1 to [place] and 1 to [place]. She has never been on any other plan besides Cat 3 so not sure if she should change.  Subjective:   1. Prediabetes Michelle Mcmillan is currently on Wellbutrin 150 mg, Trintellix too expensive. Denies suicidal ideas, and homicidal ideas.  2. Other depression Michelle Mcmillan's last A1c was 6.0, insulin was 11.8. She is not taking medications.  Assessment/Plan:   1. Prediabetes Michelle Mcmillan will continue taking Wellbutrin: consider Viibryd in future if she is still experiencing symptoms.  2. Other depression Repeat labs in Sept/October.  3. Obesity, Current BMI is 46.3 Michelle Mcmillan is currently in the action stage of change. As such, her goal is to continue with weight loss efforts. She has agreed to the Category 3 Plan and the [place] +300.  Exercise goals: No exercise has been prescribed at this time.  Behavioral modification strategies: increasing lean protein intake, meal planning and cooking strategies, better snacking choices, and planning for success.  Michelle Mcmillan has agreed to follow-up with our clinic in 3 weeks. She was informed of the importance of frequent follow-up visits to maximize her success with intensive lifestyle modifications for her multiple health  conditions.   Objective:   Blood pressure 115/78, pulse 80, temperature 98.4 F (36.9 C), height '5\' 9"'$  (1.753 m), weight (!) 313 lb (142 kg). Body mass index is 46.22 kg/m.  General: Cooperative, alert, well developed, in no acute distress. HEENT: Conjunctivae and lids unremarkable. Cardiovascular: Regular rhythm.  Lungs: Normal work of breathing. Neurologic: No focal deficits.   Lab Results  Component Value Date   CREATININE 1.03 (H) 09/06/2021   BUN 21 (H) 09/06/2021   NA 142 09/06/2021   K 4.1 09/06/2021   CL 103 09/06/2021   CO2 30 09/06/2021   Lab Results  Component Value Date   ALT 27 09/06/2021   AST 28 09/06/2021   ALKPHOS 95 09/06/2021   BILITOT 0.4 09/06/2021   Lab Results  Component Value Date   HGBA1C 6.0 (H) 06/20/2021   Lab Results  Component Value Date   INSULIN 11.8 06/20/2021   Lab Results  Component Value Date   TSH 2.020 06/20/2021   Lab Results  Component Value Date   CHOL 179 06/20/2021   HDL 65 06/20/2021   LDLCALC 105 (H) 06/20/2021   TRIG 47 06/20/2021   Lab Results  Component Value Date   VD25OH 45.2 06/20/2021   Lab Results  Component Value Date   WBC 7.1 09/06/2021   HGB 12.7 09/06/2021   HCT 41.0 09/06/2021   MCV 84.4 09/06/2021   PLT 239 09/06/2021   Lab Results  Component Value Date   IRON 49 09/06/2021   TIBC 273 09/06/2021   FERRITIN 224 09/06/2021   Attestation Statements:  Reviewed by clinician on day of visit: allergies, medications, problem list, medical history, surgical history, family history, social history, and previous encounter notes.  I, Elnora Morrison, RMA am acting as transcriptionist for Coralie Common, MD.  I have reviewed the above documentation for accuracy and completeness, and I agree with the above. - Coralie Common, MD

## 2021-10-18 ENCOUNTER — Ambulatory Visit: Payer: Medicare Other | Admitting: Physical Therapy

## 2021-10-18 DIAGNOSIS — M25511 Pain in right shoulder: Secondary | ICD-10-CM | POA: Diagnosis not present

## 2021-10-18 DIAGNOSIS — M25552 Pain in left hip: Secondary | ICD-10-CM

## 2021-10-18 DIAGNOSIS — M6281 Muscle weakness (generalized): Secondary | ICD-10-CM

## 2021-10-18 DIAGNOSIS — R262 Difficulty in walking, not elsewhere classified: Secondary | ICD-10-CM

## 2021-10-18 DIAGNOSIS — M25551 Pain in right hip: Secondary | ICD-10-CM

## 2021-10-18 NOTE — Therapy (Signed)
OUTPATIENT PHYSICAL THERAPY TREATMENT NOTE   Patient Name: Michelle Mcmillan MRN: [id number] DOB:1970/07/29, 51 y.o., female Today's Date: 10/22/2021  PCP: Donnie Coffin, MD REFERRING PROVIDER: Esaw Grandchild, NP  END OF SESSION:   PT End of Session - 10/22/21 2030     Visit Number 2    Number of Visits 13    Date for PT Re-Evaluation 11/29/21    Authorization Type UHC Medicare/Medicaid, VL based on medical necessity    Progress Note Due on Visit 10    PT Start Time 1125    PT Stop Time 1205    PT Time Calculation (min) 40 min    Activity Tolerance Patient tolerated treatment well    Behavior During Therapy [facility] for tasks assessed/performed             Past Medical History:  Diagnosis Date   Anemia    H/O   Anemia    Arthritis    RIGHT KNEE   Back pain    Breast cancer ([place]) 2017   right breast   Cancer ([place])    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)   Carpal tunnel syndrome    Constipation    Depression    Dyspnea    GERD (gastroesophageal reflux disease)    NO MEDS   Headache    migraines   Hypertension    Joint pain    Last menstrual period (LMP) > 10 days ago 08/2015   Lower extremity edema    Migraines    Neuropathy    OSA (obstructive sleep apnea)    Personal history of radiation therapy    Prediabetes    SOB (shortness of breath)    Past Surgical History:  Procedure Laterality Date   BREAST BIOPSY Right 05/08/2017   invasive mamm. ca   BREAST EXCISIONAL BIOPSY Right 06/15/15   breast cancer   BREAST LUMPECTOMY WITH NEEDLE LOCALIZATION Right 06/15/2015   Procedure: BREAST LUMPECTOMY WITH NEEDLE LOCALIZATION;  Surgeon: Hubbard Robinson, MD;  Location: [facility];  Service: General;  Laterality: Right;   BREAST RECONSTRUCTION WITH PLACEMENT OF TISSUE EXPANDER AND FLEX HD (ACELLULAR HYDRATED DERMIS) Left 02/09/2018   Procedure: BREAST RECONSTRUCTION WITH PLACEMENT OF TISSUE EXPANDER AND FLEX HD (ACELLULAR HYDRATED DERMIS);  Surgeon: Wallace Going, DO;   Location: [facility];  Service: Plastics;  Laterality: Left;   BREAST SURGERY     CESAREAN SECTION  1988   x1   COLONOSCOPY WITH PROPOFOL N/A 01/23/2017   Procedure: COLONOSCOPY WITH PROPOFOL;  Surgeon: Lin Landsman, MD;  Location: [facility] ENDOSCOPY;  Service: Gastroenterology;  Laterality: N/A;   ESOPHAGOGASTRODUODENOSCOPY (EGD) WITH PROPOFOL N/A 01/23/2017   Procedure: ESOPHAGOGASTRODUODENOSCOPY (EGD) WITH PROPOFOL;  Surgeon: Lin Landsman, MD;  Location: [facility] ENDOSCOPY;  Service: Gastroenterology;  Laterality: N/A;   LATISSIMUS FLAP TO BREAST Right 06/01/2018   Procedure: RIGHT BREAST RECONSTRUCTION WITH LATISSIMUS MYOCUTANEOUS FLAP;  Surgeon: Wallace Going, DO;  Location: [place];  Service: Plastics;  Laterality: Right;   LIPOSUCTION WITH LIPOFILLING Bilateral 10/15/2018   Procedure: LIPOSUCTION TO BILATERAL BREASTS;  Surgeon: Wallace Going, DO;  Location: [place];  Service: Plastics;  Laterality: Bilateral;   LIPOSUCTION WITH LIPOFILLING Bilateral 04/22/2019   Procedure: fat grafting/liposuction and lipofilling bilateral breasts;  Surgeon: Wallace Going, DO;  Location: [place];  Service: Plastics;  Laterality: Bilateral;   MASS EXCISION N/A 05/20/2016   Procedure: EXCISION CHEST WALL MASS;  Surgeon: Florene Glen, MD;  Location: [facility];  Service: General;  Laterality: N/A;   RECONSTRUCTION BREAST W/ LATISSIMUS DORSI FLAP Right 06/01/2018   REMOVAL OF BILATERAL TISSUE EXPANDERS WITH PLACEMENT OF BILATERAL BREAST IMPLANTS Bilateral 10/15/2018   Procedure: REMOVAL OF BILATERAL TISSUE EXPANDERS WITH PLACEMENT OF BILATERAL BREAST IMPLANTS;  Surgeon: Wallace Going, DO;  Location: [place];  Service: Plastics;  Laterality: Bilateral;   SCAR REVISION Bilateral 04/22/2019   Procedure: Excision of bilateral scar contracture;  Surgeon: Wallace Going, DO;  Location: [place];  Service: Plastics;   Laterality: Bilateral;  total case 90 min   SENTINEL NODE BIOPSY Right 06/15/2015   Procedure: SENTINEL NODE BIOPSY;  Surgeon: Hubbard Robinson, MD;  Location: [facility];  Service: General;  Laterality: Right;   TOTAL MASTECTOMY Right 05/22/2017   Procedure: TOTAL MASTECTOMY;  Surgeon: Florene Glen, MD;  Location: [facility];  Service: General;  Laterality: Right;   TOTAL MASTECTOMY Left 02/09/2018   Procedure: LEFT PROPHYLACTIC  MASTECTOMY;  Surgeon: Jovita Kussmaul, MD;  Location: [facility];  Service: General;  Laterality: Left;   TUBAL LIGATION  2004   ULNAR NERVE TRANSPOSITION Left 01/29/2019   Procedure: LEFT ULNAR NERVE DECOMPRESSION/TRANSPOSITION;  Surgeon: Leanora Cover, MD;  Location: [place];  Service: Orthopedics;  Laterality: Left;   Patient Active Problem List   Diagnosis Date Noted   Elevated blood pressure reading in office with white coat syndrome, with diagnosis of hypertension 08/08/2021   Limited joint range of motion (ROM) 07/19/2021   Class 3 severe obesity due to excess calories with body mass index (BMI) of 45.0 to 49.9 in adult ([place]) 06/25/2021   Prediabetes 06/21/2021   Elevated ferritin level 06/21/2021   OSA on CPAP 04/20/2021   Migraine headache without aura 07/21/2020   Ulnar neuropathy 08/28/2018   S/P breast reconstruction, bilateral 06/16/2018   Acquired absence of right breast 06/01/2018   S/P mastectomy, bilateral 02/17/2018   Acquired absence of left breast 02/09/2018   Acquired absence of breast and absent nipple, right 01/02/2018   Postoperative breast asymmetry 01/02/2018   Intractable nausea and vomiting 08/11/2017   Breast cancer ([place]) 05/22/2017   Morbid (severe) obesity due to excess calories ([place]) 04/15/2017   Microcytic anemia 04/15/2017   GERD (gastroesophageal reflux disease) 04/15/2017   Chest wall mass    Malignant neoplasm of upper-outer quadrant of right female breast ([place]) 05/25/2015   HTN (hypertension) 05/24/2015     REFERRING DIAG: M70.62 (ICD-10-CM) - Trochanteric bursitis of left hip  THERAPY DIAG:  Pain in right hip  Pain in left hip  Difficulty in walking, not elsewhere classified  Muscle weakness (generalized)  Rationale for Evaluation and Treatment Rehabilitation  PERTINENT HISTORY: Patient is a 51 year old female with complaint of bilateral hip pain, more pain on R at this time due to Hx of injection in L hip. Pt had greater trochanteric injection on her L side on 09/24/21 - this has helped with tolerance of walking. Patient reports moderate referral to her R lateral thigh proximally. Pt denies paresthesias or numbness in lower body. Pt reports pain along posterolateral gluteal region to greater trochanter R>L side. Pt reports that symptoms began about 4-6 months ago and has progressively worsened. Pt reports fall last year without Fx or major injury ("lot of soreness"). Pt reports intermittent disturbed sleep with turning on her side. Patient reports no bowel/bladder changes. Patient reports hx of sciatica when she was pregnant, though not in recent years. Pt reports numbness with prolonged sitting  LLE. Hx of varicose veins.     PRECAUTIONS: None  OBJECTIVE: (objective measures completed at initial evaluation unless otherwise dated)   DIAGNOSTIC FINDINGS:  CT Scan Chest, abomen, pelvis: negative for metastatic disease, mild degenerative change of bilateral hips.   DXA Bone density scan: T-score of 0.7     PATIENT SURVEYS:  FOTO 47 , predicted goal score of 57   COGNITION:           Overall cognitive status: Within functional limits for tasks assessed                          SENSATION: WFL   POSTURE: Overpronation bilat feet, genu valgum   PALPATION: Tenderness to palpation along R>L greater trochanter, gluteus medius     LOWER EXTREMITY ROM:   Lumbar spine AROM WNL with exception of significant trunk rotation motion loss R and L; back pain with extension, hip pain with  trunk rotation in either direction     Active ROM Right eval Left eval  Hip flexion 95 95  Hip extension      Hip abduction 20* 40*  Hip adduction      Hip internal rotation 28 30  Hip external rotation 25 22  Knee flexion      Knee extension      Ankle dorsiflexion      Ankle plantarflexion      Ankle inversion      Ankle eversion       (Blank rows = not tested)   LOWER EXTREMITY MMT:   MMT Right eval Left eval  Hip flexion 5 5  Hip extension      Hip abduction 4-* 4  Hip adduction 5 5  Hip internal rotation 4+ 5  Hip external rotation 4+ 4+  Knee flexion 5 5  Knee extension 5 5  Ankle dorsiflexion      Ankle plantarflexion      Ankle inversion      Ankle eversion       (Blank rows = not tested)   LOWER EXTREMITY SPECIAL TESTS:  Hip special tests: Saralyn Pilar (FABER) test: positive  and Hip scouring test: positive , Straight leg raise: Negative bilaterally     GAIT: Comments: mild compensated Trendelenburg R>L        TODAY'S TREATMENT:   SUBJECTIVE: Patient reports notable limping this AM. She reports feeling more pain on R side due to being s/p steroid injection on her L greater trochanteric region.Marland Kitchen  PAIN:  Are you having pain? Yes: NPRS scale: 4/10 Pain location: R>L hip pain laterally     Manual Therapy - for symptom modulation, soft tissue sensitivity and mobility, joint mobility, ROM   RLE long-leg distraction, intermittent; 10 sec on, 5 sec off; x 5 minutes  Mulligan belt lateral distraction in hooklying and mobilization with movement with conjunct hip ER, IR, and flexion; x 5 minutes bilaterally   STM/DTM and IASTM with Hypervolt bilateral gluteus medius x 15 minutes     Therapeutic Exercise - for improved soft tissue flexibility and extensibility as needed for ROM, improved strength as needed to improve performance of CKC activities/functional movements  Supine piriformis stretch; 2x30 sec, bilat  PATIENT EDUCATION: Reviewed frequent  cryotherapy at home for bilateral lateral hip irritation and to continue with current stretching program        PATIENT EDUCATION:  Education details:  see above for patient education details   Person educated: Patient Education method:  Explanation, Demonstration, and Handouts Education comprehension: verbalized understanding and returned demonstration     HOME EXERCISE PROGRAM: Access Code [email or web address]   ASSESSMENT:   CLINICAL IMPRESSION: Patient arrives with excellent motivation to participate in physical therapy. Pt is able to access full ER PROM with use of mobilization with movement and 30 deg IR without increase in lateral hip pain. Pt has participated well with home stretching/icing routine. Will progress with isometrics and active home mobility work next visit. Patient has remaining deficits in bilateral hip ROM, hip abductor strength, gait changes, local nociceptive lateral hip pain. Patient will benefit from continued skilled therapeutic intervention to address the above deficits as needed for improved function and QoL.     OBJECTIVE IMPAIRMENTS Abnormal gait, difficulty walking, decreased ROM, decreased strength, hypomobility, and pain.    ACTIVITY LIMITATIONS sitting, standing, squatting, sleeping, stairs, transfers, bed mobility, and locomotion level   PARTICIPATION LIMITATIONS: shopping and community activity   PERSONAL FACTORS Time since onset of injury/illness/exacerbation and 3+ comorbidities: (obesity, HTN, depression, Hx breast cancer, neuropathy)  are also affecting patient's functional outcome.    REHAB POTENTIAL: Good   CLINICAL DECISION MAKING: Evolving/moderate complexity   EVALUATION COMPLEXITY: Moderate     GOALS:   SHORT TERM GOALS: Target date: 11/07/2021  Patient will be independent and compliant with HEP as needed for symptom modulation, mobility, and strength gains to augment in-clinic PT intervention and improve pt function  Baseline: 10/16/21:  Baseline HEP initiated Goal status: INITIAL     LONG TERM GOALS: Target date: 11/29/2021    Patient will demonstrate improved function as evidenced by a score of 57 on FOTO measure for full participation in activities at home and in the community. Baseline: 10/16/21 47 Goal status: INITIAL   2.  Patient will report NPRS no greater than 2-3/10 with daily activities as needed for improved ability to perform self-care, access community, and participate in desired life roles  Baseline: 718/23: NPRS 6-7/10 at worst  Goal status: INITIAL   3.  Patient will have MMT 5/5 for all tested hip motions indicative of improved strength as needed for improved tolerance to loading affected tissues during prolonged weightbearing and closed-chain work Baseline: 10/16/21: MMT R/L Hip abduction 4-/4, Hip IR 4+/5, Hip ER 4+/4+ Goal status: INITIAL   4.  Patient will sleep through the night without disturbed sleep secondary to hip pain on either side as needed for improved sleep quality and long-term wellness Baseline: 10/16/21: Intermittent disturbed sleep when rolling onto R more so than L side Goal status: INITIAL   5.  Patient will complete errands in town including ambulating around grocery store/shopping center without increase in pain > 2/10 indicative of community-level mobility without increase in hip symptoms Baseline: 10/16/21: Notable pain with walking, pain worsens with higher volume of ambulatory activity.  Goal status: INITIAL       PLAN: PT FREQUENCY: 2x/week   PT DURATION: 6 weeks   PLANNED INTERVENTIONS: Therapeutic exercises, Therapeutic activity, Neuromuscular re-education, Balance training, Gait training, Patient/Family education, Self Care, Joint mobilization, Dry Needling, Electrical stimulation, Cryotherapy, Moist heat, and Manual therapy   PLAN FOR NEXT SESSION: Manual therapy for hip mobility and distraction techniques to modulate hip pain, continued stretching and icing for  patient's HEP, initiate hip abductor/adductor isometrics next visit      Valentina Gu, PT, DPT #[id number]  Eilleen Kempf, PT 10/22/2021, 8:31 PM

## 2021-10-22 ENCOUNTER — Ambulatory Visit (INDEPENDENT_AMBULATORY_CARE_PROVIDER_SITE_OTHER): Payer: Medicare Other

## 2021-10-22 ENCOUNTER — Ambulatory Visit (INDEPENDENT_AMBULATORY_CARE_PROVIDER_SITE_OTHER): Payer: Medicare Other | Admitting: Physician Assistant

## 2021-10-22 ENCOUNTER — Other Ambulatory Visit: Payer: Self-pay

## 2021-10-22 ENCOUNTER — Encounter: Payer: Self-pay | Admitting: Physician Assistant

## 2021-10-22 DIAGNOSIS — G8929 Other chronic pain: Secondary | ICD-10-CM | POA: Diagnosis not present

## 2021-10-22 DIAGNOSIS — M7062 Trochanteric bursitis, left hip: Secondary | ICD-10-CM

## 2021-10-22 DIAGNOSIS — M25562 Pain in left knee: Secondary | ICD-10-CM

## 2021-10-22 NOTE — Progress Notes (Signed)
Office Visit Note   Patient: Michelle Mcmillan           Date of Birth: 14-Dec-1970           MRN: [id number] Visit Date: 10/22/2021              Requested by: Donnie Coffin, MD [place],  [place] [address] PCP: Donnie Coffin, MD   Assessment & Plan: Visit Diagnoses:  1. Chronic pain of left knee     Plan: Discussed quad strengthening exercises with her.  Also discussed knee friendly exercises.  We will send her to [facility] PT in [place] for strengthening of both knees home exercise program and modalities.  She will continue to work on weight loss.  She will also continue to work on ITB stretching work with therapy on this also.  Follow-up with [place] as needed.  Follow-Up Instructions: Return if symptoms worsen or fail to improve.   Orders:  Orders Placed This Encounter  Procedures   XR Knee 1-2 Views Left   No orders of the defined types were placed in this encounter.     Procedures: No procedures performed   Clinical Data: No additional findings.   Subjective: Chief Complaint  Patient presents with   Left Hip - Pain    HPI Mrs. Solomon returns today in follow-up of her bilateral hip pain.  She states that the injection helped on the left but therapy has helped with both hips.  She is doing home exercise program.  Still states she has some pain when lying on the left hip at night.  But overall much improved in regards to the trochanteric bursitis.  She feels her left leg is "weak".  She has occasional giving way left leg.  This is remained unchanged and occurs several times a week.  She also notes a painful popping at times in both knees more so on the left than the right.  She notes some back pain but has no true radicular symptoms down the leg.  However her left leg does give numb at times with prolonged sitting. Review of Systems See HPI  Objective: Vital Signs: There were no vitals taken for this visit.  Physical Exam Constitutional:       Appearance: She is not ill-appearing or diaphoretic.  Pulmonary:     Effort: Pulmonary effort is normal.  Neurological:     Mental Status: She is alert and oriented to person, place, and time.  Psychiatric:        Mood and Affect: Mood normal.     Ortho Exam Bilateral hips good range of motion of both hips without pain.  Tenderness over the trochanteric region both hips.  Lower extremities 5 out of 5 strength throughout against resistance.  Negative straight leg raise bilaterally.  Bilateral knees good range of motion.  Nontender at the medial lateral joint line both knees.  No instability valgus varus stressing of either knee.  Patellofemoral crepitus of bilateral knees with range of motion. Specialty Comments:  No specialty comments available.  Imaging: XR Knee 1-2 Views Left  Result Date: 10/22/2021 Left knee 2 views: No acute fracture.  Tricompartmental arthritic changes with severe patellofemoral changes and moderate to severe medial compartmental changes.  Anvil osteophyte noted on the lateral view.  Knee is well-preserved.    PMFS History: Patient Active Problem List   Diagnosis Date Noted   Elevated blood pressure reading in office with white coat syndrome, with diagnosis  of hypertension 08/08/2021   Limited joint range of motion (ROM) 07/19/2021   Class 3 severe obesity due to excess calories with body mass index (BMI) of 45.0 to 49.9 in adult ([place]) 06/25/2021   Prediabetes 06/21/2021   Elevated ferritin level 06/21/2021   OSA on CPAP 04/20/2021   Migraine headache without aura 07/21/2020   Ulnar neuropathy 08/28/2018   S/P breast reconstruction, bilateral 06/16/2018   Acquired absence of right breast 06/01/2018   S/P mastectomy, bilateral 02/17/2018   Acquired absence of left breast 02/09/2018   Acquired absence of breast and absent nipple, right 01/02/2018   Postoperative breast asymmetry 01/02/2018   Intractable nausea and vomiting 08/11/2017   Breast cancer ([place])  05/22/2017   Morbid (severe) obesity due to excess calories ([place]) 04/15/2017   Microcytic anemia 04/15/2017   GERD (gastroesophageal reflux disease) 04/15/2017   Chest wall mass    Malignant neoplasm of upper-outer quadrant of right female breast ([place]) 05/25/2015   HTN (hypertension) 05/24/2015   Past Medical History:  Diagnosis Date   Anemia    H/O   Anemia    Arthritis    RIGHT KNEE   Back pain    Breast cancer ([place]) 2017   right breast   Cancer ([place])    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)   Carpal tunnel syndrome    Constipation    Depression    Dyspnea    GERD (gastroesophageal reflux disease)    NO MEDS   Headache    migraines   Hypertension    Joint pain    Last menstrual period (LMP) > 10 days ago 08/2015   Lower extremity edema    Migraines    Neuropathy    OSA (obstructive sleep apnea)    Personal history of radiation therapy    Prediabetes    SOB (shortness of breath)     Family History  Problem Relation Age of Onset   Hypertension Mother    Obesity Mother    Hypertension Father    Diabetes Father    Hypertension Sister    Breast cancer Paternal Aunt        dx 64s   Breast cancer Paternal Aunt        dx 108s   Breast cancer Cousin 47    Past Surgical History:  Procedure Laterality Date   BREAST BIOPSY Right 05/08/2017   invasive mamm. ca   BREAST EXCISIONAL BIOPSY Right 06/15/15   breast cancer   BREAST LUMPECTOMY WITH NEEDLE LOCALIZATION Right 06/15/2015   Procedure: BREAST LUMPECTOMY WITH NEEDLE LOCALIZATION;  Surgeon: Hubbard Robinson, MD;  Location: [facility];  Service: General;  Laterality: Right;   BREAST RECONSTRUCTION WITH PLACEMENT OF TISSUE EXPANDER AND FLEX HD (ACELLULAR HYDRATED DERMIS) Left 02/09/2018   Procedure: BREAST RECONSTRUCTION WITH PLACEMENT OF TISSUE EXPANDER AND FLEX HD (ACELLULAR HYDRATED DERMIS);  Surgeon: Wallace Going, DO;  Location: [facility];  Service: Plastics;  Laterality: Left;   BREAST SURGERY     CESAREAN SECTION   1988   x1   COLONOSCOPY WITH PROPOFOL N/A 01/23/2017   Procedure: COLONOSCOPY WITH PROPOFOL;  Surgeon: Lin Landsman, MD;  [facility];  Service: Gastroenterology;  Laterality: N/A;   ESOPHAGOGASTRODUODENOSCOPY (EGD) WITH PROPOFOL N/A 01/23/2017   Procedure: ESOPHAGOGASTRODUODENOSCOPY (EGD) WITH PROPOFOL;  Surgeon: Lin Landsman, MD;  Location: [facility] ENDOSCOPY;  Service: Gastroenterology;  Laterality: N/A;   LATISSIMUS FLAP TO BREAST Right 06/01/2018   Procedure: RIGHT BREAST RECONSTRUCTION WITH LATISSIMUS MYOCUTANEOUS FLAP;  Surgeon:  Dillingham, Loel Lofty, DO;  Location: [place];  Service: Plastics;  Laterality: Right;   LIPOSUCTION WITH LIPOFILLING Bilateral 10/15/2018   Procedure: LIPOSUCTION TO BILATERAL BREASTS;  Surgeon: Wallace Going, DO;  Location: [place];  Service: Plastics;  Laterality: Bilateral;   LIPOSUCTION WITH LIPOFILLING Bilateral 04/22/2019   Procedure: fat grafting/liposuction and lipofilling bilateral breasts;  Surgeon: Wallace Going, DO;  Location: [place];  Service: Plastics;  Laterality: Bilateral;   MASS EXCISION N/A 05/20/2016   Procedure: EXCISION CHEST WALL MASS;  Surgeon: Florene Glen, MD;  Location: [facility];  Service: General;  Laterality: N/A;   RECONSTRUCTION BREAST W/ LATISSIMUS DORSI FLAP Right 06/01/2018   REMOVAL OF BILATERAL TISSUE EXPANDERS WITH PLACEMENT OF BILATERAL BREAST IMPLANTS Bilateral 10/15/2018   Procedure: REMOVAL OF BILATERAL TISSUE EXPANDERS WITH PLACEMENT OF BILATERAL BREAST IMPLANTS;  Surgeon: Wallace Going, DO;  Location: [place];  Service: Plastics;  Laterality: Bilateral;   SCAR REVISION Bilateral 04/22/2019   Procedure: Excision of bilateral scar contracture;  Surgeon: Wallace Going, DO;  Location: [place];  Service: Plastics;  Laterality: Bilateral;  total case 90 min   SENTINEL NODE BIOPSY Right 06/15/2015   Procedure:  SENTINEL NODE BIOPSY;  Surgeon: Hubbard Robinson, MD;  Location: [facility];  Service: General;  Laterality: Right;   TOTAL MASTECTOMY Right 05/22/2017   Procedure: TOTAL MASTECTOMY;  Surgeon: Florene Glen, MD;  Location: [facility];  Service: General;  Laterality: Right;   TOTAL MASTECTOMY Left 02/09/2018   Procedure: LEFT PROPHYLACTIC  MASTECTOMY;  Surgeon: Jovita Kussmaul, MD;  Location: [facility];  Service: General;  Laterality: Left;   TUBAL LIGATION  2004   ULNAR NERVE TRANSPOSITION Left 01/29/2019   Procedure: LEFT ULNAR NERVE DECOMPRESSION/TRANSPOSITION;  Surgeon: Leanora Cover, MD;  Location: [place];  Service: Orthopedics;  Laterality: Left;   Social History   Occupational History   Occupation: Disabled  Tobacco Use   Smoking status: Never   Smokeless tobacco: Never  Vaping Use   Vaping Use: Never used  Substance and Sexual Activity   Alcohol use: Not Currently   Drug use: No   Sexual activity: Not Currently

## 2021-10-23 ENCOUNTER — Ambulatory Visit: Payer: Medicare Other | Admitting: Physical Therapy

## 2021-10-23 DIAGNOSIS — M6281 Muscle weakness (generalized): Secondary | ICD-10-CM

## 2021-10-23 DIAGNOSIS — M25551 Pain in right hip: Secondary | ICD-10-CM

## 2021-10-23 DIAGNOSIS — R262 Difficulty in walking, not elsewhere classified: Secondary | ICD-10-CM

## 2021-10-23 DIAGNOSIS — M25511 Pain in right shoulder: Secondary | ICD-10-CM | POA: Diagnosis not present

## 2021-10-23 DIAGNOSIS — M25552 Pain in left hip: Secondary | ICD-10-CM

## 2021-10-23 NOTE — Therapy (Signed)
OUTPATIENT PHYSICAL THERAPY TREATMENT NOTE   Patient Name: Michelle Mcmillan MRN: [id number] DOB:September 30, 1970, 51 y.o., female Today's Date: 10/23/2021  PCP: Donnie Coffin, MD REFERRING PROVIDER: ***  END OF SESSION:    Past Medical History:  Diagnosis Date   Anemia    H/O   Anemia    Arthritis    RIGHT KNEE   Back pain    Breast cancer ([place]) 2017   right breast   Cancer ([place])    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)   Carpal tunnel syndrome    Constipation    Depression    Dyspnea    GERD (gastroesophageal reflux disease)    NO MEDS   Headache    migraines   Hypertension    Joint pain    Last menstrual period (LMP) > 10 days ago 08/2015   Lower extremity edema    Migraines    Neuropathy    OSA (obstructive sleep apnea)    Personal history of radiation therapy    Prediabetes    SOB (shortness of breath)    Past Surgical History:  Procedure Laterality Date   BREAST BIOPSY Right 05/08/2017   invasive mamm. ca   BREAST EXCISIONAL BIOPSY Right 06/15/15   breast cancer   BREAST LUMPECTOMY WITH NEEDLE LOCALIZATION Right 06/15/2015   Procedure: BREAST LUMPECTOMY WITH NEEDLE LOCALIZATION;  Surgeon: Hubbard Robinson, MD;  Location: [facility];  Service: General;  Laterality: Right;   BREAST RECONSTRUCTION WITH PLACEMENT OF TISSUE EXPANDER AND FLEX HD (ACELLULAR HYDRATED DERMIS) Left 02/09/2018   Procedure: BREAST RECONSTRUCTION WITH PLACEMENT OF TISSUE EXPANDER AND FLEX HD (ACELLULAR HYDRATED DERMIS);  Surgeon: Wallace Going, DO;  Location: [facility];  Service: Plastics;  Laterality: Left;   BREAST SURGERY     CESAREAN SECTION  1988   x1   COLONOSCOPY WITH PROPOFOL N/A 01/23/2017   Procedure: COLONOSCOPY WITH PROPOFOL;  Surgeon: Lin Landsman, MD;  Location: [facility];  Service: Gastroenterology;  Laterality: N/A;   ESOPHAGOGASTRODUODENOSCOPY (EGD) WITH PROPOFOL N/A 01/23/2017   Procedure: ESOPHAGOGASTRODUODENOSCOPY (EGD) WITH PROPOFOL;  Surgeon: Lin Landsman,  MD;  Location: [facility]COPY;  Service: Gastroenterology;  Laterality: N/A;   LATISSIMUS FLAP TO BREAST Right 06/01/2018   Procedure: RIGHT BREAST RECONSTRUCTION WITH LATISSIMUS MYOCUTANEOUS FLAP;  Surgeon: Wallace Going, DO;  Location: [place];  Service: Plastics;  Laterality: Right;   LIPOSUCTION WITH LIPOFILLING Bilateral 10/15/2018   Procedure: LIPOSUCTION TO BILATERAL BREASTS;  Surgeon: Wallace Going, DO;  Location: [place];  Service: Plastics;  Laterality: Bilateral;   LIPOSUCTION WITH LIPOFILLING Bilateral 04/22/2019   Procedure: fat grafting/liposuction and lipofilling bilateral breasts;  Surgeon: Wallace Going, DO;  Location: [place];  Service: Plastics;  Laterality: Bilateral;   MASS EXCISION N/A 05/20/2016   Procedure: EXCISION CHEST WALL MASS;  Surgeon: Florene Glen, MD;  Location: [facility];  Service: General;  Laterality: N/A;   RECONSTRUCTION BREAST W/ LATISSIMUS DORSI FLAP Right 06/01/2018   REMOVAL OF BILATERAL TISSUE EXPANDERS WITH PLACEMENT OF BILATERAL BREAST IMPLANTS Bilateral 10/15/2018   Procedure: REMOVAL OF BILATERAL TISSUE EXPANDERS WITH PLACEMENT OF BILATERAL BREAST IMPLANTS;  Surgeon: Wallace Going, DO;  Location: [place];  Service: Plastics;  Laterality: Bilateral;   SCAR REVISION Bilateral 04/22/2019   Procedure: Excision of bilateral scar contracture;  Surgeon: Wallace Going, DO;  Location: [place];  Service: Plastics;  Laterality: Bilateral;  total case 90 min   SENTINEL NODE BIOPSY Right  06/15/2015   Procedure: SENTINEL NODE BIOPSY;  Surgeon: Hubbard Robinson, MD;  Location: [facility];  Service: General;  Laterality: Right;   TOTAL MASTECTOMY Right 05/22/2017   Procedure: TOTAL MASTECTOMY;  Surgeon: Florene Glen, MD;  Location: [facility];  Service: General;  Laterality: Right;   TOTAL MASTECTOMY Left 02/09/2018   Procedure: LEFT PROPHYLACTIC  MASTECTOMY;   Surgeon: Jovita Kussmaul, MD;  Location: [facility];  Service: General;  Laterality: Left;   TUBAL LIGATION  2004   ULNAR NERVE TRANSPOSITION Left 01/29/2019   Procedure: LEFT ULNAR NERVE DECOMPRESSION/TRANSPOSITION;  Surgeon: Leanora Cover, MD;  Location: [place];  Service: Orthopedics;  Laterality: Left;   Patient Active Problem List   Diagnosis Date Noted   Elevated blood pressure reading in office with white coat syndrome, with diagnosis of hypertension 08/08/2021   Limited joint range of motion (ROM) 07/19/2021   Class 3 severe obesity due to excess calories with body mass index (BMI) of 45.0 to 49.9 in adult ([place]) 06/25/2021   Prediabetes 06/21/2021   Elevated ferritin level 06/21/2021   OSA on CPAP 04/20/2021   Migraine headache without aura 07/21/2020   Ulnar neuropathy 08/28/2018   S/P breast reconstruction, bilateral 06/16/2018   Acquired absence of right breast 06/01/2018   S/P mastectomy, bilateral 02/17/2018   Acquired absence of left breast 02/09/2018   Acquired absence of breast and absent nipple, right 01/02/2018   Postoperative breast asymmetry 01/02/2018   Intractable nausea and vomiting 08/11/2017   Breast cancer ([place]) 05/22/2017   Morbid (severe) obesity due to excess calories ([place]) 04/15/2017   Microcytic anemia 04/15/2017   GERD (gastroesophageal reflux disease) 04/15/2017   Chest wall mass    Malignant neoplasm of upper-outer quadrant of right female breast ([place]) 05/25/2015   HTN (hypertension) 05/24/2015    REFERRING DIAG: M70.62 (ICD-10-CM) - Trochanteric bursitis of left hip   THERAPY DIAG:  Pain in right hip   Pain in left hip   Difficulty in walking, not elsewhere classified   Muscle weakness (generalized)   Rationale for Evaluation and Treatment Rehabilitation   PERTINENT HISTORY: Patient is a 51 year old female with complaint of bilateral hip pain, more pain on R at this time due to Hx of injection in L hip. Pt had greater  trochanteric injection on her L side on 09/24/21 - this has helped with tolerance of walking. Patient reports moderate referral to her R lateral thigh proximally. Pt denies paresthesias or numbness in lower body. Pt reports pain along posterolateral gluteal region to greater trochanter R>L side. Pt reports that symptoms began about 4-6 months ago and has progressively worsened. Pt reports fall last year without Fx or major injury ("lot of soreness"). Pt reports intermittent disturbed sleep with turning on her side. Patient reports no bowel/bladder changes. Patient reports hx of sciatica when she was pregnant, though not in recent years. Pt reports numbness with prolonged sitting LLE. Hx of varicose veins.      PRECAUTIONS: None   OBJECTIVE: (objective measures completed at initial evaluation unless otherwise dated)     DIAGNOSTIC FINDINGS:  CT Scan Chest, abomen, pelvis: negative for metastatic disease, mild degenerative change of bilateral hips.   DXA Bone density scan: T-score of 0.7     PATIENT SURVEYS:  FOTO 47 , predicted goal score of 57   COGNITION:           Overall cognitive status: Within functional limits for tasks assessed  SENSATION: WFL   POSTURE: Overpronation bilat feet, genu valgum   PALPATION: Tenderness to palpation along R>L greater trochanter, gluteus medius 10/23/21: Tenderness to palpation along R patellar tendon, LCL, MCL, lateral>medial joint line, medial/lateral hamstrings; L medial joint line, MCL, medial/lateral hamstrings.      LOWER EXTREMITY ROM:   Lumbar spine AROM WNL with exception of significant trunk rotation motion loss R and L; back pain with extension, hip pain with trunk rotation in either direction        Knee/ankle updated 10/23/21 Active ROM Right eval Left eval  Hip flexion 95 95  Hip extension      Hip abduction 20* 40*  Hip adduction      Hip internal rotation 28 30  Hip external rotation 25 22  Knee  flexion  100 deg  101 deg  Knee extension  -2 -2   Ankle dorsiflexion To neutral  To neutral   Ankle plantarflexion      Ankle inversion      Ankle eversion       (Blank rows = not tested)     LOWER EXTREMITY MMT:   MMT Right eval Left eval  Hip flexion 5 5  Hip extension      Hip abduction 4-* 4  Hip adduction 5 5  Hip internal rotation 4+ 5  Hip external rotation 4+ 4+  Knee flexion 5 4+ (updated 10/23/21)  Knee extension 5 5  Ankle dorsiflexion      Ankle plantarflexion      Ankle inversion      Ankle eversion       (Blank rows = not tested)   LOWER EXTREMITY SPECIAL TESTS:  Hip special tests: Saralyn Pilar (FABER) test: positive  and Hip scouring test: positive , Straight leg raise: Negative bilaterally     GAIT: Comments: mild compensated Trendelenburg R>L        TODAY'S TREATMENT:   SUBJECTIVE: Patient has new referral for chronic knee pain and bilat knee strengthening. Pt reports intermittent popping followed by limping on her L knee. Pt reports L>R knee pain. Patient reports notable limping this AM. Pt reports that home program for her hips is helping.    PAIN:  Are you having pain? Yes: NPRS scale: 2-3/10 Pain location: R>L hip pain laterally        Manual Therapy - for symptom modulation, soft tissue sensitivity and mobility, joint mobility, ROM    RLE long-leg distraction, intermittent; 10 sec on, 5 sec off; x 5 minutes   Mulligan belt lateral distraction in hooklying and mobilization with movement with conjunct hip ER, IR, and flexion; x 5 minutes bilaterally               STM/DTM and IASTM with Hyper[employer] bilateral gluteus medius x 15 minutes         Therapeutic Exercise - for improved soft tissue flexibility and extensibility as needed for ROM, improved strength as needed to improve performance of CKC activities/functional movements   Supine piriformis stretch; 2x30 sec, bilat   PATIENT EDUCATION: Reviewed frequent cryotherapy at home for bilateral  lateral hip irritation and to continue with current stretching program         PATIENT EDUCATION:  Education details:  see above for patient education details   Person educated: Patient Education method: Explanation, Demonstration, and Handouts Education comprehension: verbalized understanding and returned demonstration     HOME EXERCISE PROGRAM: Access Code [email or web address]   ASSESSMENT:   CLINICAL IMPRESSION: Patient arrives with excellent  motivation to participate in physical therapy. Pt is able to access full ER PROM with use of mobilization with movement and 30 deg IR without increase in lateral hip pain. Pt has participated well with home stretching/icing routine. Will progress with isometrics and active home mobility work next visit. Patient has remaining deficits in bilateral hip ROM, hip abductor strength, gait changes, local nociceptive lateral hip pain. Patient will benefit from continued skilled therapeutic intervention to address the above deficits as needed for improved function and QoL.      OBJECTIVE IMPAIRMENTS Abnormal gait, difficulty walking, decreased ROM, decreased strength, hypomobility, and pain.    ACTIVITY LIMITATIONS sitting, standing, squatting, sleeping, stairs, transfers, bed mobility, and locomotion level   PARTICIPATION LIMITATIONS: shopping and community activity   PERSONAL FACTORS Time since onset of injury/illness/exacerbation and 3+ comorbidities: (obesity, HTN, depression, Hx breast cancer, neuropathy)  are also affecting patient's functional outcome.    REHAB POTENTIAL: Good   CLINICAL DECISION MAKING: Evolving/moderate complexity   EVALUATION COMPLEXITY: Moderate     GOALS:   SHORT TERM GOALS: Target date: 11/07/2021  Patient will be independent and compliant with HEP as needed for symptom modulation, mobility, and strength gains to augment in-clinic PT intervention and improve pt function  Baseline: 10/16/21: Baseline HEP initiated Goal status:  INITIAL     LONG TERM GOALS: Target date: 11/29/2021    Patient will demonstrate improved function as evidenced by a score of 57 on FOTO measure for full participation in activities at home and in the community. Baseline: 10/16/21 47 Goal status: INITIAL   2.  Patient will report NPRS no greater than 2-3/10 with daily activities as needed for improved ability to perform self-care, access community, and participate in desired life roles  Baseline: 718/23: NPRS 6-7/10 at worst  Goal status: INITIAL   3.  Patient will have MMT 5/5 for all tested hip motions indicative of improved strength as needed for improved tolerance to loading affected tissues during prolonged weightbearing and closed-chain work Baseline: 10/16/21: MMT R/L Hip abduction 4-/4, Hip IR 4+/5, Hip ER 4+/4+ Goal status: INITIAL   4.  Patient will sleep through the night without disturbed sleep secondary to hip pain on either side as needed for improved sleep quality and long-term wellness Baseline: 10/16/21: Intermittent disturbed sleep when rolling onto R more so than L side Goal status: INITIAL   5.  Patient will complete errands in town including ambulating around grocery store/shopping center without increase in pain > 2/10 indicative of community-level mobility without increase in hip symptoms Baseline: 10/16/21: Notable pain with walking, pain worsens with higher volume of ambulatory activity.  Goal status: INITIAL       PLAN: PT FREQUENCY: 2x/week   PT DURATION: 6 weeks   PLANNED INTERVENTIONS: Therapeutic exercises, Therapeutic activity, Neuromuscular re-education, Balance training, Gait training, Patient/Family education, Self Care, Joint mobilization, Dry Needling, Electrical stimulation, Cryotherapy, Moist heat, and Manual therapy   PLAN FOR NEXT SESSION: Manual therapy for hip mobility and distraction techniques to modulate hip pain, continued stretching and icing for patient's HEP, initiate hip abductor/adductor  isometrics next visit        Eilleen Kempf, PT 10/23/2021, 10:38 AM

## 2021-10-25 ENCOUNTER — Encounter: Payer: Self-pay | Admitting: Physical Therapy

## 2021-10-25 ENCOUNTER — Ambulatory Visit: Payer: Medicare Other | Admitting: Physical Therapy

## 2021-10-25 DIAGNOSIS — M6281 Muscle weakness (generalized): Secondary | ICD-10-CM

## 2021-10-25 DIAGNOSIS — M25552 Pain in left hip: Secondary | ICD-10-CM

## 2021-10-25 DIAGNOSIS — M25511 Pain in right shoulder: Secondary | ICD-10-CM | POA: Diagnosis not present

## 2021-10-25 DIAGNOSIS — R262 Difficulty in walking, not elsewhere classified: Secondary | ICD-10-CM

## 2021-10-25 DIAGNOSIS — M25551 Pain in right hip: Secondary | ICD-10-CM

## 2021-10-25 NOTE — Therapy (Signed)
OUTPATIENT PHYSICAL THERAPY TREATMENT NOTE   Patient Name: Michelle Mcmillan MRN: [id number] DOB:07-14-1970, 51 y.o., female Today's Date: 10/27/2021  PCP: Donnie Coffin, MD REFERRING PROVIDER: Esaw Grandchild, NP  END OF SESSION:   PT End of Session - 10/27/21 0855     Visit Number 4    Number of Visits 13    Date for PT Re-Evaluation 11/29/21    Authorization Type UHC Medicare/Medicaid, VL based on medical necessity    Progress Note Due on Visit 10    PT Start Time 1042    PT Stop Time 1123    PT Time Calculation (min) 41 min    Activity Tolerance Patient tolerated treatment well    Behavior During Therapy [facility] for tasks assessed/performed             Past Medical History:  Diagnosis Date   Anemia    H/O   Anemia    Arthritis    RIGHT KNEE   Back pain    Breast cancer ([place]) 2017   right breast   Cancer ([place])    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)   Carpal tunnel syndrome    Constipation    Depression    Dyspnea    GERD (gastroesophageal reflux disease)    NO MEDS   Headache    migraines   Hypertension    Joint pain    Last menstrual period (LMP) > 10 days ago 08/2015   Lower extremity edema    Migraines    Neuropathy    OSA (obstructive sleep apnea)    Personal history of radiation therapy    Prediabetes    SOB (shortness of breath)    Past Surgical History:  Procedure Laterality Date   BREAST BIOPSY Right 05/08/2017   invasive mamm. ca   BREAST EXCISIONAL BIOPSY Right 06/15/15   breast cancer   BREAST LUMPECTOMY WITH NEEDLE LOCALIZATION Right 06/15/2015   Procedure: BREAST LUMPECTOMY WITH NEEDLE LOCALIZATION;  Surgeon: Hubbard Robinson, MD;  Location: [facility];  Service: General;  Laterality: Right;   BREAST RECONSTRUCTION WITH PLACEMENT OF TISSUE EXPANDER AND FLEX HD (ACELLULAR HYDRATED DERMIS) Left 02/09/2018   Procedure: BREAST RECONSTRUCTION WITH PLACEMENT OF TISSUE EXPANDER AND FLEX HD (ACELLULAR HYDRATED DERMIS);  Surgeon: Wallace Going, DO;   Location: [facility];  Service: Plastics;  Laterality: Left;   BREAST SURGERY     CESAREAN SECTION  1988   x1   COLONOSCOPY WITH PROPOFOL N/A 01/23/2017   Procedure: COLONOSCOPY WITH PROPOFOL;  Surgeon: Lin Landsman, MD;  Location: [facility];  Service: Gastroenterology;  Laterality: N/A;   ESOPHAGOGASTRODUODENOSCOPY (EGD) WITH PROPOFOL N/A 01/23/2017   Procedure: ESOPHAGOGASTRODUODENOSCOPY (EGD) WITH PROPOFOL;  Surgeon: Lin Landsman, MD;  Location: [facility];  Service: Gastroenterology;  Laterality: N/A;   LATISSIMUS FLAP TO BREAST Right 06/01/2018   Procedure: RIGHT BREAST RECONSTRUCTION WITH LATISSIMUS MYOCUTANEOUS FLAP;  Surgeon: Wallace Going, DO;  Location: [place];  Service: Plastics;  Laterality: Right;   LIPOSUCTION WITH LIPOFILLING Bilateral 10/15/2018   Procedure: LIPOSUCTION TO BILATERAL BREASTS;  Surgeon: Wallace Going, DO;  Location: [place];  Service: Plastics;  Laterality: Bilateral;   LIPOSUCTION WITH LIPOFILLING Bilateral 04/22/2019   Procedure: fat grafting/liposuction and lipofilling bilateral breasts;  Surgeon: Wallace Going, DO;  Location: [place];  Service: Plastics;  Laterality: Bilateral;   MASS EXCISION N/A 05/20/2016   Procedure: EXCISION CHEST WALL MASS;  Surgeon: Florene Glen, MD;  Location: [facility];  Service: General;  Laterality: N/A;   RECONSTRUCTION BREAST W/ LATISSIMUS DORSI FLAP Right 06/01/2018   REMOVAL OF BILATERAL TISSUE EXPANDERS WITH PLACEMENT OF BILATERAL BREAST IMPLANTS Bilateral 10/15/2018   Procedure: REMOVAL OF BILATERAL TISSUE EXPANDERS WITH PLACEMENT OF BILATERAL BREAST IMPLANTS;  Surgeon: Wallace Going, DO;  Location: [place];  Service: Plastics;  Laterality: Bilateral;   SCAR REVISION Bilateral 04/22/2019   Procedure: Excision of bilateral scar contracture;  Surgeon: Wallace Going, DO;  Location: [place];  Service: Plastics;   Laterality: Bilateral;  total case 90 min   SENTINEL NODE BIOPSY Right 06/15/2015   Procedure: SENTINEL NODE BIOPSY;  Surgeon: Hubbard Robinson, MD;  Location: [facility];  Service: General;  Laterality: Right;   TOTAL MASTECTOMY Right 05/22/2017   Procedure: TOTAL MASTECTOMY;  Surgeon: Florene Glen, MD;  Location: [facility];  Service: General;  Laterality: Right;   TOTAL MASTECTOMY Left 02/09/2018   Procedure: LEFT PROPHYLACTIC  MASTECTOMY;  Surgeon: Jovita Kussmaul, MD;  Location: [facility];  Service: General;  Laterality: Left;   TUBAL LIGATION  2004   ULNAR NERVE TRANSPOSITION Left 01/29/2019   Procedure: LEFT ULNAR NERVE DECOMPRESSION/TRANSPOSITION;  Surgeon: Leanora Cover, MD;  Location: [place];  Service: Orthopedics;  Laterality: Left;   Patient Active Problem List   Diagnosis Date Noted   Elevated blood pressure reading in office with white coat syndrome, with diagnosis of hypertension 08/08/2021   Limited joint range of motion (ROM) 07/19/2021   Class 3 severe obesity due to excess calories with body mass index (BMI) of 45.0 to 49.9 in adult ([place]) 06/25/2021   Prediabetes 06/21/2021   Elevated ferritin level 06/21/2021   OSA on CPAP 04/20/2021   Migraine headache without aura 07/21/2020   Ulnar neuropathy 08/28/2018   S/P breast reconstruction, bilateral 06/16/2018   Acquired absence of right breast 06/01/2018   S/P mastectomy, bilateral 02/17/2018   Acquired absence of left breast 02/09/2018   Acquired absence of breast and absent nipple, right 01/02/2018   Postoperative breast asymmetry 01/02/2018   Intractable nausea and vomiting 08/11/2017   Breast cancer ([place]) 05/22/2017   Morbid (severe) obesity due to excess calories ([place]) 04/15/2017   Microcytic anemia 04/15/2017   GERD (gastroesophageal reflux disease) 04/15/2017   Chest wall mass    Malignant neoplasm of upper-outer quadrant of right female breast ([place]) 05/25/2015   HTN (hypertension) 05/24/2015       REFERRING DIAG: M70.62 (ICD-10-CM) - Trochanteric bursitis of left hip   THERAPY DIAG:  Pain in right hip   Pain in left hip   Difficulty in walking, not elsewhere classified   Muscle weakness (generalized)   Rationale for Evaluation and Treatment Rehabilitation   PERTINENT HISTORY: Patient is a 51 year old female with complaint of bilateral hip pain, more pain on R at this time due to Hx of injection in L hip. Pt had greater trochanteric injection on her L side on 09/24/21 - this has helped with tolerance of walking. Patient reports moderate referral to her R lateral thigh proximally. Pt denies paresthesias or numbness in lower body. Pt reports pain along posterolateral gluteal region to greater trochanter R>L side. Pt reports that symptoms began about 4-6 months ago and has progressively worsened. Pt reports fall last year without Fx or major injury ("lot of soreness"). Pt reports intermittent disturbed sleep with turning on her side. Patient reports no bowel/bladder changes. Patient reports hx of sciatica when she was pregnant, though not in  recent years. Pt reports numbness with prolonged sitting LLE. Hx of varicose veins.      PRECAUTIONS: None   OBJECTIVE: (objective measures completed at initial evaluation unless otherwise dated)     DIAGNOSTIC FINDINGS:  CT Scan Chest, abomen, pelvis: negative for metastatic disease, mild degenerative change of bilateral hips.   DXA Bone density scan: T-score of 0.7     PATIENT SURVEYS:  FOTO 47 , predicted goal score of 57   COGNITION:           Overall cognitive status: Within functional limits for tasks assessed                          SENSATION: WFL   POSTURE: Overpronation bilat feet, genu valgum   PALPATION: Tenderness to palpation along R>L greater trochanter, gluteus medius 10/23/21: Tenderness to palpation along Right patellar tendon, LCL, MCL, lateral>medial joint line, medial/lateral hamstrings; Left medial joint  line, MCL, medial/lateral hamstrings.     MUSCLE LENGTH (10/23/21) Hamstring 90/90: R WFL, L WFL Ely's Test: R Positive (90 deg knee flexion in prone), L Positive (90 deg knee flexion in prone) Gastrocnemius: R Poor (DF to neutral with knee extended), L Poor (DF to neutral with knee extended)   LOWER EXTREMITY ROM:   Lumbar spine AROM WNL with exception of significant trunk rotation motion loss R and L; back pain with extension, hip pain with trunk rotation in either direction                                Knee/ankle updated 10/23/21 Active ROM Right eval Left eval  Hip flexion 95 95  Hip extension      Hip abduction 20* 40*  Hip adduction      Hip internal rotation 28 30  Hip external rotation 25 22  Knee flexion  100 deg  101 deg  Knee extension  -2 -2   Ankle dorsiflexion To neutral  To neutral   Ankle plantarflexion      Ankle inversion      Ankle eversion       (Blank rows = not tested) Knee and ankle values taken 10/23/21   *Pain with flexion overpressure bilaterally, pain with extension overpressure on L only     LOWER EXTREMITY MMT:   MMT Right eval Left eval  Hip flexion 5 5  Hip extension      Hip abduction 4-* 4  Hip adduction 5 5  Hip internal rotation 4+ 5  Hip external rotation 4+ 4+  Knee flexion 5 4+ (updated 10/23/21)  Knee extension 5 5  Ankle dorsiflexion      Ankle plantarflexion      Ankle inversion      Ankle eversion       (Blank rows = not tested)   LOWER EXTREMITY SPECIAL TESTS:  Hip special tests: Saralyn Pilar (FABER) test: positive  and Hip scouring test: positive , Straight leg raise: Negative bilaterally   10/23/21: McMurray's test: Positive bilaterally      GAIT: Comments: mild compensated Trendelenburg R>L        TODAY'S TREATMENT:   SUBJECTIVE: Patient reports minimal pain at arrival this AM. She reports her knees felt more tender after testing last visit, but they are better this AM. She reports compliance with new exercises.     PAIN:  Are you having pain? Yes: NPRS scale: 2-3/10 Pain location: R>L  hip pain laterally, L>R anterior knee pain       Manual Therapy - for symptom modulation, soft tissue sensitivity and mobility, joint mobility, ROM     Bilat long-leg distraction, intermittent; 10 sec on, 5 sec off; x 5 minutes on either side             STM/DTM and IASTM with Theraband roller bilateral quadriceps x 5 minutes each side      *not today*  Mulligan belt lateral distraction in hooklying and mobilization with movement with conjunct hip ER, IR, and flexion; x 5 minutes bilaterally         Therapeutic Exercise - for improved soft tissue flexibility and extensibility as needed for ROM, improved strength as needed to improve performance of CKC activities/functional movements    Prone Quad stretch; 2x30 sec, bilat Supine gastrocnemius stretch; reviewed Hip abductor and adductor isometrics; x10, 10 sec each direction    PATIENT EDUCATION: HEP update to include hip isometrics and review of existing exercises      *not today* Supine piriformis stretch; 2x30 sec, bilat      PATIENT EDUCATION:  Education details:  see above for patient education details   Person educated: Patient Education method: Explanation, Demonstration, and Handouts Education comprehension: verbalized understanding and returned demonstration     HOME EXERCISE PROGRAM: Access Code [email or web address]   ASSESSMENT:   CLINICAL IMPRESSION: Patient does feel that initial therapy visits have been helping and she has tolerated new exercises (related to bilateral knee assessment) well. Patient has bilateral tightness in quadriceps femoris and has modest improvement in Ely's test following use of IASTM/DTM along bilateral quads. Pt tolerates hip abductor isometrics well today without remarkable onset of pain. Patient has remaining deficits in bilateral hip ROM, hip abductor strength, gait changes, local nociceptive lateral hip pain.  Patient will benefit from continued skilled therapeutic intervention to address the above deficits as needed for improved function and QoL.      OBJECTIVE IMPAIRMENTS Abnormal gait, difficulty walking, decreased ROM, decreased strength, hypomobility, and pain.    ACTIVITY LIMITATIONS sitting, standing, squatting, sleeping, stairs, transfers, bed mobility, and locomotion level   PARTICIPATION LIMITATIONS: shopping and community activity   PERSONAL FACTORS Time since onset of injury/illness/exacerbation and 3+ comorbidities: (obesity, HTN, depression, Hx breast cancer, neuropathy)  are also affecting patient's functional outcome.    REHAB POTENTIAL: Good   CLINICAL DECISION MAKING: Evolving/moderate complexity   EVALUATION COMPLEXITY: Moderate     GOALS:   SHORT TERM GOALS: Target date: 11/07/2021  Patient will be independent and compliant with HEP as needed for symptom modulation, mobility, and strength gains to augment in-clinic PT intervention and improve pt function  Baseline: 10/16/21: Baseline HEP initiated Goal status: INITIAL     LONG TERM GOALS: Target date: 11/29/2021    Patient will demonstrate improved function as evidenced by a score of 57 on FOTO measure for full participation in activities at home and in the community. Baseline: 10/16/21 47 Goal status: INITIAL   2.  Patient will report NPRS no greater than 2-3/10 with daily activities as needed for improved ability to perform self-care, access community, and participate in desired life roles  Baseline: 718/23: NPRS 6-7/10 at worst  Goal status: INITIAL   3.  Patient will have MMT 5/5 for all tested hip motions indicative of improved strength as needed for improved tolerance to loading affected tissues during prolonged weightbearing and closed-chain work Baseline: 10/16/21: MMT R/L Hip abduction 4-/4, Hip IR 4+/5, Hip ER 4+/4+  Goal status: INITIAL   4.  Patient will sleep through the night without disturbed sleep  secondary to hip pain on either side as needed for improved sleep quality and long-term wellness Baseline: 10/16/21: Intermittent disturbed sleep when rolling onto R more so than L side Goal status: INITIAL   5.  Patient will complete errands in town including ambulating around grocery store/shopping center without increase in pain > 2/10 indicative of community-level mobility without increase in hip symptoms Baseline: 10/16/21: Notable pain with walking, pain worsens with higher volume of ambulatory activity.  Goal status: INITIAL       PLAN: PT FREQUENCY: 2x/week   PT DURATION: 6 weeks   PLANNED INTERVENTIONS: Therapeutic exercises, Therapeutic activity, Neuromuscular re-education, Balance training, Gait training, Patient/Family education, Self Care, Joint mobilization, Dry Needling, Electrical stimulation, Cryotherapy, Moist heat, and Manual therapy   PLAN FOR NEXT SESSION: Manual therapy for hip mobility and distraction techniques to modulate hip pain, continued stretching and icing for patient's HEP, initiate hip abductor/adductor isometrics next visit. Progress with hip and hamstring strengthening and knee mobility as able.     Valentina Gu, PT, DPT #[id number]  Eilleen Kempf, PT 10/27/2021, 8:56 AM

## 2021-10-28 ENCOUNTER — Other Ambulatory Visit: Payer: Self-pay | Admitting: Oncology

## 2021-10-30 ENCOUNTER — Ambulatory Visit: Payer: Medicare Other | Attending: Adult Health | Admitting: Physical Therapy

## 2021-10-30 ENCOUNTER — Encounter: Payer: Self-pay | Admitting: Physical Therapy

## 2021-10-30 ENCOUNTER — Encounter: Payer: Self-pay | Admitting: Oncology

## 2021-10-30 DIAGNOSIS — M25552 Pain in left hip: Secondary | ICD-10-CM | POA: Insufficient documentation

## 2021-10-30 DIAGNOSIS — M25551 Pain in right hip: Secondary | ICD-10-CM | POA: Insufficient documentation

## 2021-10-30 DIAGNOSIS — R262 Difficulty in walking, not elsewhere classified: Secondary | ICD-10-CM | POA: Diagnosis present

## 2021-10-30 DIAGNOSIS — M6281 Muscle weakness (generalized): Secondary | ICD-10-CM | POA: Diagnosis present

## 2021-10-30 NOTE — Therapy (Signed)
OUTPATIENT PHYSICAL THERAPY TREATMENT NOTE   Patient Name: Michelle Mcmillan MRN: [id number] DOB:Sep 19, 1970, 51 y.o., female Today's Date: 10/31/2021  PCP: Donnie Coffin, MD REFERRING PROVIDER: Esaw Grandchild, NP  END OF SESSION:   PT End of Session - 10/30/21 1113     Visit Number 5    Number of Visits 13    Date for PT Re-Evaluation 11/29/21    Authorization Type UHC Medicare/Medicaid, VL based on medical necessity    Progress Note Due on Visit 10    PT Start Time 1033    PT Stop Time 1114    PT Time Calculation (min) 41 min    Activity Tolerance Patient tolerated treatment well    Behavior During Therapy [facility] for tasks assessed/performed             Past Medical History:  Diagnosis Date   Anemia    H/O   Anemia    Arthritis    RIGHT KNEE   Back pain    Breast cancer ([place]) 2017   right breast   Cancer ([place])    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)   Carpal tunnel syndrome    Constipation    Depression    Dyspnea    GERD (gastroesophageal reflux disease)    NO MEDS   Headache    migraines   Hypertension    Joint pain    Last menstrual period (LMP) > 10 days ago 08/2015   Lower extremity edema    Migraines    Neuropathy    OSA (obstructive sleep apnea)    Personal history of radiation therapy    Prediabetes    SOB (shortness of breath)    Past Surgical History:  Procedure Laterality Date   BREAST BIOPSY Right 05/08/2017   invasive mamm. ca   BREAST EXCISIONAL BIOPSY Right 06/15/15   breast cancer   BREAST LUMPECTOMY WITH NEEDLE LOCALIZATION Right 06/15/2015   Procedure: BREAST LUMPECTOMY WITH NEEDLE LOCALIZATION;  Surgeon: Hubbard Robinson, MD;  Location: [facility];  Service: General;  Laterality: Right;   BREAST RECONSTRUCTION WITH PLACEMENT OF TISSUE EXPANDER AND FLEX HD (ACELLULAR HYDRATED DERMIS) Left 02/09/2018   Procedure: BREAST RECONSTRUCTION WITH PLACEMENT OF TISSUE EXPANDER AND FLEX HD (ACELLULAR HYDRATED DERMIS);  Surgeon: Wallace Going, DO;   Location: [facility];  Service: Plastics;  Laterality: Left;   BREAST SURGERY     CESAREAN SECTION  1988   x1   COLONOSCOPY WITH PROPOFOL N/A 01/23/2017   Procedure: COLONOSCOPY WITH PROPOFOL;  Surgeon: Lin Landsman, MD;  Location: [facility] END[facility]COPY;  Service: Gastroenterology;  Laterality: N/A;   ESOPHAGOGASTRODUODENOSCOPY (EGD) WITH PROPOFOL N/A 01/23/2017   Procedure: ESOPHAGOGASTRODUODENOSCOPY (EGD) WITH PROPOFOL;  Surgeon: Lin Landsman, MD;  Location: [facility] ENDOSCOPY;  Service: Gastroenterology;  Laterality: N/A;   LATISSIMUS FLAP TO BREAST Right 06/01/2018   Procedure: RIGHT BREAST RECONSTRUCTION WITH LATISSIMUS MYOCUTANEOUS FLAP;  Surgeon: Wallace Going, DO;  Location: [place];  Service: Plastics;  Laterality: Right;   LIPOSUCTION WITH LIPOFILLING Bilateral 10/15/2018   Procedure: LIPOSUCTION TO BILATERAL BREASTS;  Surgeon: Wallace Going, DO;  Location: [place];  Service: Plastics;  Laterality: Bilateral;   LIPOSUCTION WITH LIPOFILLING Bilateral 04/22/2019   Procedure: fat grafting/liposuction and lipofilling bilateral breasts;  Surgeon: Wallace Going, DO;  Location: [place];  Service: Plastics;  Laterality: Bilateral;   MASS EXCISION N/A 05/20/2016   Procedure: EXCISION CHEST WALL MASS;  Surgeon: Florene Glen, MD;  Location: [facility];  Service: General;  Laterality: N/A;   RECONSTRUCTION BREAST W/ LATISSIMUS DORSI FLAP Right 06/01/2018   REMOVAL OF BILATERAL TISSUE EXPANDERS WITH PLACEMENT OF BILATERAL BREAST IMPLANTS Bilateral 10/15/2018   Procedure: REMOVAL OF BILATERAL TISSUE EXPANDERS WITH PLACEMENT OF BILATERAL BREAST IMPLANTS;  Surgeon: Wallace Going, DO;  Location: [place];  Service: Plastics;  Laterality: Bilateral;   SCAR REVISION Bilateral 04/22/2019   Procedure: Excision of bilateral scar contracture;  Surgeon: Wallace Going, DO;  Location: [place];  Service: Plastics;   Laterality: Bilateral;  total case 90 min   SENTINEL NODE BIOPSY Right 06/15/2015   Procedure: SENTINEL NODE BIOPSY;  Surgeon: Hubbard Robinson, MD;  Location: [facility];  Service: General;  Laterality: Right;   TOTAL MASTECTOMY Right 05/22/2017   Procedure: TOTAL MASTECTOMY;  Surgeon: Florene Glen, MD;  Location: [facility];  Service: General;  Laterality: Right;   TOTAL MASTECTOMY Left 02/09/2018   Procedure: LEFT PROPHYLACTIC  MASTECTOMY;  Surgeon: Jovita Kussmaul, MD;  Location: [facility];  Service: General;  Laterality: Left;   TUBAL LIGATION  2004   ULNAR NERVE TRANSPOSITION Left 01/29/2019   Procedure: LEFT ULNAR NERVE DECOMPRESSION/TRANSPOSITION;  Surgeon: Leanora Cover, MD;  Location: [place];  Service: Orthopedics;  Laterality: Left;   Patient Active Problem List   Diagnosis Date Noted   Elevated blood pressure reading in office with white coat syndrome, with diagnosis of hypertension 08/08/2021   Limited joint range of motion (ROM) 07/19/2021   Class 3 severe obesity due to excess calories with body mass index (BMI) of 45.0 to 49.9 in adult ([place]) 06/25/2021   Prediabetes 06/21/2021   Elevated ferritin level 06/21/2021   OSA on CPAP 04/20/2021   Migraine headache without aura 07/21/2020   Ulnar neuropathy 08/28/2018   S/P breast reconstruction, bilateral 06/16/2018   Acquired absence of right breast 06/01/2018   S/P mastectomy, bilateral 02/17/2018   Acquired absence of left breast 02/09/2018   Acquired absence of breast and absent nipple, right 01/02/2018   Postoperative breast asymmetry 01/02/2018   Intractable nausea and vomiting 08/11/2017   Breast cancer ([place]) 05/22/2017   Morbid (severe) obesity due to excess calories ([place]) 04/15/2017   Microcytic anemia 04/15/2017   GERD (gastroesophageal reflux disease) 04/15/2017   Chest wall mass    Malignant neoplasm of upper-outer quadrant of right female breast ([place]) 05/25/2015   HTN (hypertension) 05/24/2015         REFERRING DIAG: M70.62 (ICD-10-CM) - Trochanteric bursitis of left hip   THERAPY DIAG:  Pain in right hip   Pain in left hip   Difficulty in walking, not elsewhere classified   Muscle weakness (generalized)   Rationale for Evaluation and Treatment Rehabilitation   PERTINENT HISTORY: Patient is a 51 year old female with complaint of bilateral hip pain, more pain on R at this time due to Hx of injection in L hip. Pt had greater trochanteric injection on her L side on 09/24/21 - this has helped with tolerance of walking. Patient reports moderate referral to her R lateral thigh proximally. Pt denies paresthesias or numbness in lower body. Pt reports pain along posterolateral gluteal region to greater trochanter R>L side. Pt reports that symptoms began about 4-6 months ago and has progressively worsened. Pt reports fall last year without Fx or major injury ("lot of soreness"). Pt reports intermittent disturbed sleep with turning on her side. Patient reports no bowel/bladder changes. Patient reports hx of sciatica when she was pregnant, though  not in recent years. Pt reports numbness with prolonged sitting LLE. Hx of varicose veins.      PRECAUTIONS: None   OBJECTIVE: (objective measures completed at initial evaluation unless otherwise dated)     DIAGNOSTIC FINDINGS:  CT Scan Chest, abomen, pelvis: negative for metastatic disease, mild degenerative change of bilateral hips.   DXA Bone density scan: T-score of 0.7     PATIENT SURVEYS:  FOTO 47 , predicted goal score of 57   COGNITION:           Overall cognitive status: Within functional limits for tasks assessed                          SENSATION: WFL   POSTURE: Overpronation bilat feet, genu valgum   PALPATION: Tenderness to palpation along R>L greater trochanter, gluteus medius 10/23/21: Tenderness to palpation along Right patellar tendon, LCL, MCL, lateral>medial joint line, medial/lateral hamstrings; Left medial joint  line, MCL, medial/lateral hamstrings.     MUSCLE LENGTH (10/23/21) Hamstring 90/90: R WFL, L WFL Ely's Test: R Positive (90 deg knee flexion in prone), L Positive (90 deg knee flexion in prone) Gastrocnemius: R Poor (DF to neutral with knee extended), L Poor (DF to neutral with knee extended)   LOWER EXTREMITY ROM:   Lumbar spine AROM WNL with exception of significant trunk rotation motion loss R and L; back pain with extension, hip pain with trunk rotation in either direction                                Knee/ankle updated 10/23/21 Active ROM Right eval Left eval  Hip flexion 95 95  Hip extension      Hip abduction 20* 40*  Hip adduction      Hip internal rotation 28 30  Hip external rotation 25 22  Knee flexion  100 deg  101 deg  Knee extension  -2 -2   Ankle dorsiflexion To neutral  To neutral   Ankle plantarflexion      Ankle inversion      Ankle eversion       (Blank rows = not tested) Knee and ankle values taken 10/23/21   *Pain with flexion overpressure bilaterally, pain with extension overpressure on L only     LOWER EXTREMITY MMT:   MMT Right eval Left eval  Hip flexion 5 5  Hip extension      Hip abduction 4-* 4  Hip adduction 5 5  Hip internal rotation 4+ 5  Hip external rotation 4+ 4+  Knee flexion 5 4+ (updated 10/23/21)  Knee extension 5 5  Ankle dorsiflexion      Ankle plantarflexion      Ankle inversion      Ankle eversion       (Blank rows = not tested)   LOWER EXTREMITY SPECIAL TESTS:  Hip special tests: Saralyn Pilar (FABER) test: positive  and Hip scouring test: positive , Straight leg raise: Negative bilaterally   10/23/21: McMurray's test: Positive bilaterally      GAIT: Comments: mild compensated Trendelenburg R>L        TODAY'S TREATMENT:   SUBJECTIVE: Patient reports completing more walking over the last 2 days - deep cleaning her house. She reports more  R>L hip pain posterolaterally with this activity. Patient reports no pain at  rest at arrival.    PAIN:  Are you having pain? No Pain  location: R>L hip pain laterally, L>R anterior knee pain       Manual Therapy - for symptom modulation, soft tissue sensitivity and mobility, joint mobility, ROM      Bilat long-leg distraction, intermittent; 10 sec on, 5 sec off; x 5 minutes on either side           Bilateral STM and IASTM with Hypervolt along gluteus medius and minimus; x 5 minutes on each side                 *not today*             Mulligan belt lateral distraction in hooklying and mobilization with movement with conjunct hip ER, IR, and flexion; x 5 minutes bilaterally    TM/DTM and IASTM with Theraband roller bilateral quadriceps x 5 minutes each side         Therapeutic Exercise - for improved soft tissue flexibility and extensibility as needed for ROM, improved strength as needed to improve performance of CKC activities/functional movements; progressive loading of affected tissues to promote tissue remodeling and healing   Supine piriformis stretch; 2x30 sec, bilat Supine clamshell with Green resistance band; 2x10 Supine glute bridge; 2x10         Pt edu: Reviewed current HEP and concepts for trochanteric pain management       *not today* Prone Quad stretch; 2x30 sec, bilat Hip abductor and adductor isometrics; x10, 10 sec each direction Supine gastrocnemius stretch     PATIENT EDUCATION:  Education details:  see above for patient education details   Person educated: Patient Education method: Consulting civil engineer, Demonstration, and Handouts Education comprehension: verbalized understanding and returned demonstration     HOME EXERCISE PROGRAM: Access Code [email or web address]   ASSESSMENT:   CLINICAL IMPRESSION: Patient has notable fatigue with isometrics performed at home, but has not experienced increase in pain with isometric performance. She is able to perform low-impact gluteal and hamstring strengthening on table today without significant LE pain.  Will progress with hip and LE strengthening with progression of activities in PT, with expectation to utilize more closed-chain drills to strengthen knees as patient moves further in PT. Patient has remaining deficits in bilateral hip ROM, hip abductor strength, gait changes, local nociceptive lateral hip pain. Patient will benefit from continued skilled therapeutic intervention to address the above deficits as needed for improved function and QoL.      OBJECTIVE IMPAIRMENTS Abnormal gait, difficulty walking, decreased ROM, decreased strength, hypomobility, and pain.    ACTIVITY LIMITATIONS sitting, standing, squatting, sleeping, stairs, transfers, bed mobility, and locomotion level   PARTICIPATION LIMITATIONS: shopping and community activity   PERSONAL FACTORS Time since onset of injury/illness/exacerbation and 3+ comorbidities: (obesity, HTN, depression, Hx breast cancer, neuropathy)  are also affecting patient's functional outcome.    REHAB POTENTIAL: Good   CLINICAL DECISION MAKING: Evolving/moderate complexity   EVALUATION COMPLEXITY: Moderate     GOALS:   SHORT TERM GOALS: Target date: 11/07/2021  Patient will be independent and compliant with HEP as needed for symptom modulation, mobility, and strength gains to augment in-clinic PT intervention and improve pt function  Baseline: 10/16/21: Baseline HEP initiated Goal status: INITIAL     LONG TERM GOALS: Target date: 11/29/2021    Patient will demonstrate improved function as evidenced by a score of 57 on FOTO measure for full participation in activities at home and in the community. Baseline: 10/16/21 47 Goal status: INITIAL   2.  Patient will report NPRS no greater than 2-3/10  with daily activities as needed for improved ability to perform self-care, access community, and participate in desired life roles  Baseline: 718/23: NPRS 6-7/10 at worst  Goal status: INITIAL   3.  Patient will have MMT 5/5 for all tested hip motions  indicative of improved strength as needed for improved tolerance to loading affected tissues during prolonged weightbearing and closed-chain work Baseline: 10/16/21: MMT R/L Hip abduction 4-/4, Hip IR 4+/5, Hip ER 4+/4+ Goal status: INITIAL   4.  Patient will sleep through the night without disturbed sleep secondary to hip pain on either side as needed for improved sleep quality and long-term wellness Baseline: 10/16/21: Intermittent disturbed sleep when rolling onto R more so than L side Goal status: INITIAL   5.  Patient will complete errands in town including ambulating around grocery store/shopping center without increase in pain > 2/10 indicative of community-level mobility without increase in hip symptoms Baseline: 10/16/21: Notable pain with walking, pain worsens with higher volume of ambulatory activity.  Goal status: INITIAL       PLAN: PT FREQUENCY: 2x/week   PT DURATION: 6 weeks   PLANNED INTERVENTIONS: Therapeutic exercises, Therapeutic activity, Neuromuscular re-education, Balance training, Gait training, Patient/Family education, Self Care, Joint mobilization, Dry Needling, Electrical stimulation, Cryotherapy, Moist heat, and Manual therapy   PLAN FOR NEXT SESSION: Manual therapy for hip mobility and distraction techniques to modulate hip pain, continued stretching and icing for patient's HEP, initiate hip abductor/adductor isometrics next visit. Progress with hip and hamstring strengthening and knee mobility as able.        Valentina Gu, PT, DPT #[id number]  Eilleen Kempf, PT 10/31/2021, 1:21 PM

## 2021-11-01 ENCOUNTER — Telehealth: Payer: Self-pay | Admitting: Licensed Clinical Social Worker

## 2021-11-01 ENCOUNTER — Ambulatory Visit: Payer: Medicare Other | Admitting: Physical Therapy

## 2021-11-01 DIAGNOSIS — M25551 Pain in right hip: Secondary | ICD-10-CM

## 2021-11-01 DIAGNOSIS — M25552 Pain in left hip: Secondary | ICD-10-CM

## 2021-11-01 DIAGNOSIS — M6281 Muscle weakness (generalized): Secondary | ICD-10-CM

## 2021-11-01 DIAGNOSIS — R262 Difficulty in walking, not elsewhere classified: Secondary | ICD-10-CM

## 2021-11-01 NOTE — Therapy (Signed)
OUTPATIENT PHYSICAL THERAPY TREATMENT NOTE   Patient Name: Michelle Mcmillan MRN: [id number] DOB:[birth year], 51 y.o., female Today's Date: 11/05/2021  PCP: Donnie Coffin, MD REFERRING PROVIDER: Esaw Grandchild, NP  END OF SESSION:   PT End of Session - 11/05/21 1309     Visit Number 6    Number of Visits 13    Date for PT Re-Evaluation 11/29/21    Authorization Type UHC Medicare/Medicaid, VL based on medical necessity    Progress Note Due on Visit 10    PT Start Time 0739    PT Stop Time 0818    PT Time Calculation (min) 39 min    Activity Tolerance Patient tolerated treatment well    Behavior During Therapy [facility] for tasks assessed/performed             Past Medical History:  Diagnosis Date   Anemia    H/O   Anemia    Arthritis    RIGHT KNEE   Back pain    Breast cancer ([place]) 2017   right breast   Cancer ([place])    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)   Carpal tunnel syndrome    Constipation    Depression    Dyspnea    GERD (gastroesophageal reflux disease)    NO MEDS   Headache    migraines   Hypertension    Joint pain    Last menstrual period (LMP) > 10 days ago 08/2015   Lower extremity edema    Migraines    Neuropathy    OSA (obstructive sleep apnea)    Personal history of radiation therapy    Prediabetes    SOB (shortness of breath)    Past Surgical History:  Procedure Laterality Date   BREAST BIOPSY Right 05/08/2017   invasive mamm. ca   BREAST EXCISIONAL BIOPSY Right 06/15/15   breast cancer   BREAST LUMPECTOMY WITH NEEDLE LOCALIZATION Right 06/15/2015   Procedure: BREAST LUMPECTOMY WITH NEEDLE LOCALIZATION;  Surgeon: Hubbard Robinson, MD;  Location: [facility] ORS;  Service: General;  Laterality: Right;   BREAST RECONSTRUCTION WITH PLACEMENT OF TISSUE EXPANDER AND FLEX HD (ACELLULAR HYDRATED DERMIS) Left 02/09/2018   Procedure: BREAST RECONSTRUCTION WITH PLACEMENT OF TISSUE EXPANDER AND FLEX HD (ACELLULAR HYDRATED DERMIS);  Surgeon: Wallace Going, DO;   Location: [facility];  Service: Plastics;  Laterality: Left;   BREAST SURGERY     CESAREAN SECTION  1988   x1   COLONOSCOPY WITH PROPOFOL N/A 01/23/2017   Procedure: COLONOSCOPY WITH PROPOFOL;  Surgeon: Lin Landsman, MD;  Location: [facility] ENDOSCOPY;  Service: Gastroenterology;  Laterality: N/A;   ESOPHAGOGASTRODUODENOSCOPY (EGD) WITH PROPOFOL N/A 01/23/2017   Procedure: ESOPHAGOGASTRODUODENOSCOPY (EGD) WITH PROPOFOL;  Surgeon: Lin Landsman, MD;  Location: [facility];  Service: Gastroenterology;  Laterality: N/A;   LATISSIMUS FLAP TO BREAST Right 06/01/2018   Procedure: RIGHT BREAST RECONSTRUCTION WITH LATISSIMUS MYOCUTANEOUS FLAP;  Surgeon: Wallace Going, DO;  Location: [place];  Service: Plastics;  Laterality: Right;   LIPOSUCTION WITH LIPOFILLING Bilateral 10/15/2018   Procedure: LIPOSUCTION TO BILATERAL BREASTS;  Surgeon: Wallace Going, DO;  Location: [place];  Service: Plastics;  Laterality: Bilateral;   LIPOSUCTION WITH LIPOFILLING Bilateral 04/22/2019   Procedure: fat grafting/liposuction and lipofilling bilateral breasts;  Surgeon: Wallace Going, DO;  Location: [place];  Service: Plastics;  Laterality: Bilateral;   MASS EXCISION N/A 05/20/2016   Procedure: EXCISION CHEST WALL MASS;  Surgeon: Florene Glen, MD;  Location: [facility];  Service: General;  Laterality: N/A;   RECONSTRUCTION BREAST W/ LATISSIMUS DORSI FLAP Right 06/01/2018   REMOVAL OF BILATERAL TISSUE EXPANDERS WITH PLACEMENT OF BILATERAL BREAST IMPLANTS Bilateral 10/15/2018   Procedure: REMOVAL OF BILATERAL TISSUE EXPANDERS WITH PLACEMENT OF BILATERAL BREAST IMPLANTS;  Surgeon: Wallace Going, DO;  Location: [place];  Service: Plastics;  Laterality: Bilateral;   SCAR REVISION Bilateral 04/22/2019   Procedure: Excision of bilateral scar contracture;  Surgeon: Wallace Going, DO;  Location: [place];  Service: Plastics;   Laterality: Bilateral;  total case 90 min   SENTINEL NODE BIOPSY Right 06/15/2015   Procedure: SENTINEL NODE BIOPSY;  Surgeon: Hubbard Robinson, MD;  Location: [facility];  Service: General;  Laterality: Right;   TOTAL MASTECTOMY Right 05/22/2017   Procedure: TOTAL MASTECTOMY;  Surgeon: Florene Glen, MD;  Location: [facility];  Service: General;  Laterality: Right;   TOTAL MASTECTOMY Left 02/09/2018   Procedure: LEFT PROPHYLACTIC  MASTECTOMY;  Surgeon: Jovita Kussmaul, MD;  Location: [facility];  Service: General;  Laterality: Left;   TUBAL LIGATION  2004   ULNAR NERVE TRANSPOSITION Left 01/29/2019   Procedure: LEFT ULNAR NERVE DECOMPRESSION/TRANSPOSITION;  Surgeon: Leanora Cover, MD;  Location: [place];  Service: Orthopedics;  Laterality: Left;   Patient Active Problem List   Diagnosis Date Noted   Genetic testing 11/02/2021   Elevated blood pressure reading in office with white coat syndrome, with diagnosis of hypertension 08/08/2021   Limited joint range of motion (ROM) 07/19/2021   Class 3 severe obesity due to excess calories with body mass index (BMI) of 45.0 to 49.9 in adult ([place]) 06/25/2021   Prediabetes 06/21/2021   Elevated ferritin level 06/21/2021   OSA on CPAP 04/20/2021   Migraine headache without aura 07/21/2020   Ulnar neuropathy 08/28/2018   S/P breast reconstruction, bilateral 06/16/2018   Acquired absence of right breast 06/01/2018   S/P mastectomy, bilateral 02/17/2018   Acquired absence of left breast 02/09/2018   Acquired absence of breast and absent nipple, right 01/02/2018   Postoperative breast asymmetry 01/02/2018   Intractable nausea and vomiting 08/11/2017   Breast cancer ([place]) 05/22/2017   Morbid (severe) obesity due to excess calories ([place]) 04/15/2017   Microcytic anemia 04/15/2017   GERD (gastroesophageal reflux disease) 04/15/2017   Chest wall mass    Malignant neoplasm of upper-outer quadrant of right female breast ([place]) 05/25/2015    HTN (hypertension) 05/24/2015    REFERRING DIAG: M70.62 (ICD-10-CM) - Trochanteric bursitis of left hip   THERAPY DIAG:  Pain in right hip   Pain in left hip   Difficulty in walking, not elsewhere classified   Muscle weakness (generalized)   Rationale for Evaluation and Treatment Rehabilitation   PERTINENT HISTORY: Patient is a 51 year old female with complaint of bilateral hip pain, more pain on R at this time due to Hx of injection in L hip. Pt had greater trochanteric injection on her L side on 09/24/21 - this has helped with tolerance of walking. Patient reports moderate referral to her R lateral thigh proximally. Pt denies paresthesias or numbness in lower body. Pt reports pain along posterolateral gluteal region to greater trochanter R>L side. Pt reports that symptoms began about 4-6 months ago and has progressively worsened. Pt reports fall last year without Fx or major injury ("lot of soreness"). Pt reports intermittent disturbed sleep with turning on her side. Patient reports no bowel/bladder changes. Patient reports hx of sciatica when she was pregnant,  though not in recent years. Pt reports numbness with prolonged sitting LLE. Hx of varicose veins.      PRECAUTIONS: None   OBJECTIVE: (objective measures completed at initial evaluation unless otherwise dated)     DIAGNOSTIC FINDINGS:  CT Scan Chest, abomen, pelvis: negative for metastatic disease, mild degenerative change of bilateral hips.   DXA Bone density scan: T-score of 0.7     PATIENT SURVEYS:  FOTO 47 , predicted goal score of 57   COGNITION:           Overall cognitive status: Within functional limits for tasks assessed                          SENSATION: WFL   POSTURE: Overpronation bilat feet, genu valgum   PALPATION: Tenderness to palpation along R>L greater trochanter, gluteus medius 10/23/21: Tenderness to palpation along Right patellar tendon, LCL, MCL, lateral>medial joint line, medial/lateral  hamstrings; Left medial joint line, MCL, medial/lateral hamstrings.     MUSCLE LENGTH (10/23/21) Hamstring 90/90: R WFL, L WFL Ely's Test: R Positive (90 deg knee flexion in prone), L Positive (90 deg knee flexion in prone) Gastrocnemius: R Poor (DF to neutral with knee extended), L Poor (DF to neutral with knee extended)   LOWER EXTREMITY ROM:   Lumbar spine AROM WNL with exception of significant trunk rotation motion loss R and L; back pain with extension, hip pain with trunk rotation in either direction                                Knee/ankle updated 10/23/21 Active ROM Right eval Left eval  Hip flexion 95 95  Hip extension      Hip abduction 20* 40*  Hip adduction      Hip internal rotation 28 30  Hip external rotation 25 22  Knee flexion  100 deg  101 deg  Knee extension  -2 -2   Ankle dorsiflexion To neutral  To neutral   Ankle plantarflexion      Ankle inversion      Ankle eversion       (Blank rows = not tested) Knee and ankle values taken 10/23/21   *Pain with flexion overpressure bilaterally, pain with extension overpressure on L only     LOWER EXTREMITY MMT:   MMT Right eval Left eval  Hip flexion 5 5  Hip extension      Hip abduction 4-* 4  Hip adduction 5 5  Hip internal rotation 4+ 5  Hip external rotation 4+ 4+  Knee flexion 5 4+ (updated 10/23/21)  Knee extension 5 5  Ankle dorsiflexion      Ankle plantarflexion      Ankle inversion      Ankle eversion       (Blank rows = not tested)   LOWER EXTREMITY SPECIAL TESTS:  Hip special tests: Saralyn Pilar (FABER) test: positive  and Hip scouring test: positive , Straight leg raise: Negative bilaterally   10/23/21: McMurray's test: Positive bilaterally      GAIT: Comments: mild compensated Trendelenburg R>L        TODAY'S TREATMENT:   SUBJECTIVE: Patient reports feeling stiff this AM. She reports some popping in knee/ankle, but she denies notable pain at arrival to PT. Patient reports compliance  with HEP.    PAIN:  Are you having pain? No Pain location: R>L hip pain laterally, L>R anterior  knee pain       Manual Therapy - for symptom modulation, soft tissue sensitivity and mobility, joint mobility, ROM      Bilat long-leg distraction, intermittent; 10 sec on, 5 sec off; x 5 minutes on either side            Bilateral STM and IASTM with Hypervolt along gluteus medius and minimus; x 3 minutes on each side  Mulligan belt lateral distraction in hooklying and mobilization with movement with conjunct hip ER, IR, and flexion; x 3 minutes bilaterally                 *not today*                      TM/DTM and IASTM with Theraband roller bilateral quadriceps x 5 minutes each side         Therapeutic Exercise - for improved soft tissue flexibility and extensibility as needed for ROM, improved strength as needed to improve performance of CKC activities/functional movements; progressive loading of affected tissues to promote tissue remodeling and healing   Supine piriformis stretch; x 60 sec, bilat  Sidelying clamshell; 2x10, bilateral Supine glute bridge; 2x10                    Pt edu: Reviewed current HEP and concepts for trochanteric pain management       *not today* Prone Quad stretch; 2x30 sec, bilat Hip abductor and adductor isometrics; x10, 10 sec each direction Supine gastrocnemius stretch     PATIENT EDUCATION:  Education details:  see above for patient education details   Person educated: Patient Education method: Explanation, Demonstration, and Handouts Education comprehension: verbalized understanding and returned demonstration     HOME EXERCISE PROGRAM: Access Code [email or web address]   ASSESSMENT:   CLINICAL IMPRESSION: Patient has responded well with cryotherapy and stretching program at home. She has improved tolerance of bilateral hip ROM compared to initial evaluation. Pt has fleeting discomfort with L hip end-range flexion and IR that is improved with use  of MWM c Mulligan belt. Pt has done well with maintaining her home exercises, boding well for prognosis. She has tolerated recent progressions of hip strengthening well without c/o increased pain in session. Patient has remaining deficits in bilateral hip ROM, hip abductor strength, gait changes, local nociceptive lateral hip pain. Patient will benefit from continued skilled therapeutic intervention to address the above deficits as needed for improved function and QoL.      OBJECTIVE IMPAIRMENTS Abnormal gait, difficulty walking, decreased ROM, decreased strength, hypomobility, and pain.    ACTIVITY LIMITATIONS sitting, standing, squatting, sleeping, stairs, transfers, bed mobility, and locomotion level   PARTICIPATION LIMITATIONS: shopping and community activity   PERSONAL FACTORS Time since onset of injury/illness/exacerbation and 3+ comorbidities: (obesity, HTN, depression, Hx breast cancer, neuropathy)  are also affecting patient's functional outcome.    REHAB POTENTIAL: Good   CLINICAL DECISION MAKING: Evolving/moderate complexity   EVALUATION COMPLEXITY: Moderate     GOALS:   SHORT TERM GOALS: Target date: 11/07/2021  Patient will be independent and compliant with HEP as needed for symptom modulation, mobility, and strength gains to augment in-clinic PT intervention and improve pt function  Baseline: 10/16/21: Baseline HEP initiated Goal status: INITIAL     LONG TERM GOALS: Target date: 11/29/2021    Patient will demonstrate improved function as evidenced by a score of 57 on FOTO measure for full participation in activities at home and in the community.  Baseline: 10/16/21 47 Goal status: INITIAL   2.  Patient will report NPRS no greater than 2-3/10 with daily activities as needed for improved ability to perform self-care, access community, and participate in desired life roles  Baseline: 718/23: NPRS 6-7/10 at worst  Goal status: INITIAL   3.  Patient will have MMT 5/5 for all  tested hip motions indicative of improved strength as needed for improved tolerance to loading affected tissues during prolonged weightbearing and closed-chain work Baseline: 10/16/21: MMT R/L Hip abduction 4-/4, Hip IR 4+/5, Hip ER 4+/4+ Goal status: INITIAL   4.  Patient will sleep through the night without disturbed sleep secondary to hip pain on either side as needed for improved sleep quality and long-term wellness Baseline: 10/16/21: Intermittent disturbed sleep when rolling onto R more so than L side Goal status: INITIAL   5.  Patient will complete errands in town including ambulating around grocery store/shopping center without increase in pain > 2/10 indicative of community-level mobility without increase in hip symptoms Baseline: 10/16/21: Notable pain with walking, pain worsens with higher volume of ambulatory activity.  Goal status: INITIAL       PLAN: PT FREQUENCY: 2x/week   PT DURATION: 6 weeks   PLANNED INTERVENTIONS: Therapeutic exercises, Therapeutic activity, Neuromuscular re-education, Balance training, Gait training, Patient/Family education, Self Care, Joint mobilization, Dry Needling, Electrical stimulation, Cryotherapy, Moist heat, and Manual therapy   PLAN FOR NEXT SESSION: Manual therapy for hip mobility and distraction techniques to modulate hip pain, continued stretching and icing for patient's HEP, gradually progress loading of posterolateral hip musculature. Progress with hip and hamstring strengthening and knee mobility as able. Will progress into closed-chain over next week pending no regression c pain status.        Valentina Gu, PT, DPT #[id number]  Eilleen Kempf, PT 11/05/2021, 1:09 PM

## 2021-11-02 ENCOUNTER — Encounter: Payer: Self-pay | Admitting: Oncology

## 2021-11-02 ENCOUNTER — Ambulatory Visit: Payer: Self-pay | Admitting: Licensed Clinical Social Worker

## 2021-11-02 ENCOUNTER — Encounter: Payer: Self-pay | Admitting: Licensed Clinical Social Worker

## 2021-11-02 DIAGNOSIS — Z1379 Encounter for other screening for genetic and chromosomal anomalies: Secondary | ICD-10-CM | POA: Insufficient documentation

## 2021-11-02 NOTE — Telephone Encounter (Signed)
Revealed negative genetic testing. This normal result is reassuring and indicates that it is unlikely Michelle Mcmillan's cancer is due to a hereditary cause.  It is unlikely that there is an increased risk of another cancer due to a mutation in one of these genes.  However, genetic testing is not perfect, and cannot definitively rule out a hereditary cause.  It will be important for her to keep in contact with genetics to learn if any additional testing may be needed in the future.

## 2021-11-02 NOTE — Progress Notes (Signed)
HPI:  Michelle Mcmillan was previously seen in the [place] clinic due to a personal and family history of cancer and concerns regarding a hereditary predisposition to cancer. Please refer to our prior cancer genetics clinic note for more information regarding our discussion, assessment and recommendations, at the time. Michelle Mcmillan recent genetic test results were disclosed to her, as were recommendations warranted by these results. These results and recommendations are discussed in more detail below.  CANCER HISTORY:  Oncology History   No history exists.    FAMILY HISTORY:  We obtained a detailed, 4-generation family history.  Significant diagnoses are listed below: Family History  Problem Relation Age of Onset   Hypertension Mother    Obesity Mother    Hypertension Father    Diabetes Father    Hypertension Sister    Breast cancer Paternal Aunt        dx 60s   Breast cancer Paternal Aunt        dx 47s   Breast cancer Cousin 30   Michelle Mcmillan has 3 daughters, 70, 22, 81 and 1 son 30. She has 1 maternal half sister, 3 paternal half sisters and 2 paternal half brothers. One of her paternal half sisters passed of epilepsy at 29-30.   Michelle Mcmillan mother is living at 24. Maternal grandmother is living at 43, her sibling's daughter had breast cancer at 89. Patient's maternal grandfather passed at 39.   Michelle Mcmillan father is living at 69. Patient has 2 paternal half aunts (same maternal grandfather) that had breast cancer in their 10s. Of note, their mother (not related to patient) had breast cancer as well. Paternal grandmother passed in her 52s, no information about grandfather.   Michelle Mcmillan is unaware of previous family history of genetic testing for hereditary cancer risks. There is no reported Ashkenazi Jewish ancestry. There is no known consanguinity.      GENETIC TEST RESULTS: Genetic testing reported out on 10/31/2021 through the [facility] Multi-Cancer+RNA cancer panel  found no pathogenic mutations.   The Multi-Cancer Panel + RNA offered by [facility] includes sequencing and/or deletion duplication testing of the following 84 genes: AIP, ALK, APC, ATM, AXIN2,BAP1,  BARD1, BLM, BMPR1A, BRCA1, BRCA2, BRIP1, CASR, CDC73, CDH1, CDK4, CDKN1B, CDKN1C, CDKN2A (p14ARF), CDKN2A (p16INK4a), CEBPA, CHEK2, CTNNA1, DICER1, DIS3L2, EGFR (c.2369C>T, p.Thr790Met variant only), EPCAM (Deletion/duplication testing only), FH, FLCN, GATA2, GPC3, GREM1 (Promoter region deletion/duplication testing only), HOXB13 (c.251G>A, p.Gly84Glu), HRAS, KIT, MAX, MEN1, MET, MITF (c.952G>A, p.Glu318Lys variant only), MLH1, MSH2, MSH3, MSH6, MUTYH, NBN, NF1, NF2, NTHL1, PALB2, PDGFRA, PHOX2B, PMS2, POLD1, POLE, POT1, PRKAR1A, PTCH1, PTEN, RAD50, RAD51C, RAD51D, RB1, RECQL4, RET, RUNX1, SDHAF2, SDHA (sequence changes only), SDHB, SDHC, SDHD, SMAD4, SMARCA4, SMARCB1, SMARCE1, STK11, SUFU, TERC, TERT, TMEM127, TP53, TSC1, TSC2, VHL, WRN and WT1.  The test report has been scanned into EPIC and is located under the Molecular Pathology section of the Results Review tab.  A portion of the result report is included below for reference.     We discussed that because current genetic testing is not perfect, it is possible there may be a gene mutation in one of these genes that current testing cannot detect, but that chance is small.  There could be another gene that has not yet been discovered, or that we have not yet tested, that is responsible for the cancer diagnoses in the family. It is also possible there is a hereditary cause for the cancer in the family that Michelle Mcmillan did not  inherit and therefore was not identified in her testing.  Therefore, it is important to remain in touch with cancer genetics in the future so that we can continue to offer Michelle Mcmillan the most up to date genetic testing.   ADDITIONAL GENETIC TESTING: We discussed with Michelle Mcmillan that her genetic testing was fairly extensive.  If there are  genes identified to increase cancer risk that can be analyzed in the future, we would be happy to discuss and coordinate this testing at that time.    CANCER SCREENING RECOMMENDATIONS: Michelle Mcmillan test result is considered negative (normal).  This means that we have not identified a hereditary cause for her  personal and family history of cancer at this time. Most cancers happen by chance and this negative test suggests that her cancer may fall into this category.    While reassuring, this does not definitively rule out a hereditary predisposition to cancer. It is still possible that there could be genetic mutations that are undetectable by current technology. There could be genetic mutations in genes that have not been tested or identified to increase cancer risk.  Therefore, it is recommended she continue to follow the cancer management and screening guidelines provided by her oncology and primary healthcare provider.   An individual's cancer risk and medical management are not determined by genetic test results alone. Overall cancer risk assessment incorporates additional factors, including personal medical history, family history, and any available genetic information that may result in a personalized plan for cancer prevention and surveillance.  RECOMMENDATIONS FOR FAMILY MEMBERS:  Relatives in this family might be at some increased risk of developing cancer, over the general population risk, simply due to the family history of cancer.  We recommended female relatives in this family have a yearly mammogram beginning at age 39, or 53 years younger than the earliest onset of cancer, an annual clinical breast exam, and perform monthly breast self-exams. Female relatives in this family should also have a gynecological exam as recommended by their primary provider.  All family members should be referred for colonoscopy starting at age 76.   FOLLOW-UP: Lastly, we discussed with Michelle Mcmillan that cancer  genetics is a rapidly advancing field and it is possible that new genetic tests will be appropriate for her and/or her family members in the future. We encouraged her to remain in contact with cancer genetics on an annual basis so we can update her personal and family histories and let her know of advances in cancer genetics that may benefit this family.   Our contact number was provided. Michelle Mcmillan questions were answered to her satisfaction, and she knows she is welcome to call us at anytime with additional questions or concerns.   Faith Rogue, MS, [facility] Genetic Counselor [email or web address] Phone: [phone]

## 2021-11-05 ENCOUNTER — Encounter: Payer: Self-pay | Admitting: Physical Therapy

## 2021-11-05 ENCOUNTER — Encounter (INDEPENDENT_AMBULATORY_CARE_PROVIDER_SITE_OTHER): Payer: Self-pay | Admitting: Family Medicine

## 2021-11-05 ENCOUNTER — Ambulatory Visit (INDEPENDENT_AMBULATORY_CARE_PROVIDER_SITE_OTHER): Payer: Medicare Other | Admitting: Family Medicine

## 2021-11-05 ENCOUNTER — Other Ambulatory Visit (INDEPENDENT_AMBULATORY_CARE_PROVIDER_SITE_OTHER): Payer: Self-pay | Admitting: Bariatrics

## 2021-11-05 VITALS — BP 118/80 | HR 62 | Temp 98.2°F | Ht 69.0 in | Wt 312.0 lb

## 2021-11-05 DIAGNOSIS — E669 Obesity, unspecified: Secondary | ICD-10-CM | POA: Diagnosis not present

## 2021-11-05 DIAGNOSIS — K219 Gastro-esophageal reflux disease without esophagitis: Secondary | ICD-10-CM

## 2021-11-05 DIAGNOSIS — E559 Vitamin D deficiency, unspecified: Secondary | ICD-10-CM

## 2021-11-05 DIAGNOSIS — I10 Essential (primary) hypertension: Secondary | ICD-10-CM

## 2021-11-05 DIAGNOSIS — Z6841 Body Mass Index (BMI) 40.0 and over, adult: Secondary | ICD-10-CM

## 2021-11-06 ENCOUNTER — Ambulatory Visit: Payer: Medicare Other | Admitting: Physical Therapy

## 2021-11-06 DIAGNOSIS — R262 Difficulty in walking, not elsewhere classified: Secondary | ICD-10-CM

## 2021-11-06 DIAGNOSIS — M25551 Pain in right hip: Secondary | ICD-10-CM | POA: Diagnosis not present

## 2021-11-06 DIAGNOSIS — M6281 Muscle weakness (generalized): Secondary | ICD-10-CM

## 2021-11-06 DIAGNOSIS — M25552 Pain in left hip: Secondary | ICD-10-CM

## 2021-11-06 NOTE — Therapy (Unsigned)
OUTPATIENT PHYSICAL THERAPY TREATMENT NOTE   Patient Name: Michelle Mcmillan MRN: [id number] DOB:[birth year], 51 y.o., female Today's Date: 11/06/2021  PCP: Donnie Coffin, MD REFERRING PROVIDER: Esaw Grandchild, NP  END OF SESSION:   PT End of Session - 11/06/21 1241     Visit Number 7    Number of Visits 13    Date for PT Re-Evaluation 11/29/21    Authorization Type UHC Medicare/Medicaid, VL based on medical necessity    Progress Note Due on Visit 10    PT Start Time 1035    PT Stop Time 1115    PT Time Calculation (min) 40 min    Activity Tolerance Patient tolerated treatment well    Behavior During Therapy [facility] for tasks assessed/performed             Past Medical History:  Diagnosis Date   Anemia    H/O   Anemia    Arthritis    RIGHT KNEE   Back pain    Breast cancer ([place]) 2017   right breast   Cancer ([place])    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)   Carpal tunnel syndrome    Constipation    Depression    Dyspnea    GERD (gastroesophageal reflux disease)    NO MEDS   Headache    migraines   Hypertension    Joint pain    Last menstrual period (LMP) > 10 days ago 08/2015   Lower extremity edema    Migraines    Neuropathy    OSA (obstructive sleep apnea)    Personal history of radiation therapy    Prediabetes    SOB (shortness of breath)    Past Surgical History:  Procedure Laterality Date   BREAST BIOPSY Right 05/08/2017   invasive mamm. ca   BREAST EXCISIONAL BIOPSY Right 06/15/15   breast cancer   BREAST LUMPECTOMY WITH NEEDLE LOCALIZATION Right 06/15/2015   Procedure: BREAST LUMPECTOMY WITH NEEDLE LOCALIZATION;  Surgeon: Hubbard Robinson, MD;  Location: [facility];  Service: General;  Laterality: Right;   BREAST RECONSTRUCTION WITH PLACEMENT OF TISSUE EXPANDER AND FLEX HD (ACELLULAR HYDRATED DERMIS) Left 02/09/2018   Procedure: BREAST RECONSTRUCTION WITH PLACEMENT OF TISSUE EXPANDER AND FLEX HD (ACELLULAR HYDRATED DERMIS);  Surgeon: Wallace Going, DO;   Location: [facility];  Service: Plastics;  Laterality: Left;   BREAST SURGERY     CESAREAN SECTION  1988   x1   COLONOSCOPY WITH PROPOFOL N/A 01/23/2017   Procedure: COLONOSCOPY WITH PROPOFOL;  Surgeon: Lin Landsman, MD;  Location[facility] ENDOSCOPY;  Service: Gastroenterology;  Laterality: N/A;   ESOPHAGOGASTRODUODENOSCOPY (EGD) WITH PROPOFOL N/A 01/23/2017   Procedure: ESOPHAGOGASTRODUODENOSCOPY (EGD) WITH PROPOFOL;  Surgeon: Lin Landsman, MD;  Location: [facility] ENDOSCOPY;  Service: Gastroenterology;  Laterality: N/A;   LATISSIMUS FLAP TO BREAST Right 06/01/2018   Procedure: RIGHT BREAST RECONSTRUCTION WITH LATISSIMUS MYOCUTANEOUS FLAP;  Surgeon: Wallace Going, DO;  Location: [place];  Service: Plastics;  Laterality: Right;   LIPOSUCTION WITH LIPOFILLING Bilateral 10/15/2018   Procedure: LIPOSUCTION TO BILATERAL BREASTS;  Surgeon: Wallace Going, DO;  Location: [place];  Service: Plastics;  Laterality: Bilateral;   LIPOSUCTION WITH LIPOFILLING Bilateral 04/22/2019   Procedure: fat grafting/liposuction and lipofilling bilateral breasts;  Surgeon: Wallace Going, DO;  Location: [place];  Service: Plastics;  Laterality: Bilateral;   MASS EXCISION N/A 05/20/2016   Procedure: EXCISION CHEST WALL MASS;  Surgeon: Florene Glen, MD;  Location: [facility];  Service: General;  Laterality: N/A;   RECONSTRUCTION BREAST W/ LATISSIMUS DORSI FLAP Right 06/01/2018   REMOVAL OF BILATERAL TISSUE EXPANDERS WITH PLACEMENT OF BILATERAL BREAST IMPLANTS Bilateral 10/15/2018   Procedure: REMOVAL OF BILATERAL TISSUE EXPANDERS WITH PLACEMENT OF BILATERAL BREAST IMPLANTS;  Surgeon: Wallace Going, DO;  Location: [place];  Service: Plastics;  Laterality: Bilateral;   SCAR REVISION Bilateral 04/22/2019   Procedure: Excision of bilateral scar contracture;  Surgeon: Wallace Going, DO;  Location: [place];  Service: Plastics;   Laterality: Bilateral;  total case 90 min   SENTINEL NODE BIOPSY Right 06/15/2015   Procedure: SENTINEL NODE BIOPSY;  Surgeon: Hubbard Robinson, MD;  Location: [facility];  Service: General;  Laterality: Right;   TOTAL MASTECTOMY Right 05/22/2017   Procedure: TOTAL MASTECTOMY;  Surgeon: Florene Glen, MD;  Location: [facility];  Service: General;  Laterality: Right;   TOTAL MASTECTOMY Left 02/09/2018   Procedure: LEFT PROPHYLACTIC  MASTECTOMY;  Surgeon: Jovita Kussmaul, MD;  Location: [facility];  Service: General;  Laterality: Left;   TUBAL LIGATION  2004   ULNAR NERVE TRANSPOSITION Left 01/29/2019   Procedure: LEFT ULNAR NERVE DECOMPRESSION/TRANSPOSITION;  Surgeon: Leanora Cover, MD;  Location: [place];  Service: Orthopedics;  Laterality: Left;   Patient Active Problem List   Diagnosis Date Noted   Genetic testing 11/02/2021   Elevated blood pressure reading in office with white coat syndrome, with diagnosis of hypertension 08/08/2021   Limited joint range of motion (ROM) 07/19/2021   Class 3 severe obesity due to excess calories with body mass index (BMI) of 45.0 to 49.9 in adult ([place]) 06/25/2021   Prediabetes 06/21/2021   Elevated ferritin level 06/21/2021   OSA on CPAP 04/20/2021   Migraine headache without aura 07/21/2020   Ulnar neuropathy 08/28/2018   S/P breast reconstruction, bilateral 06/16/2018   Acquired absence of right breast 06/01/2018   S/P mastectomy, bilateral 02/17/2018   Acquired absence of left breast 02/09/2018   Acquired absence of breast and absent nipple, right 01/02/2018   Postoperative breast asymmetry 01/02/2018   Intractable nausea and vomiting 08/11/2017   Breast cancer ([place]) 05/22/2017   Morbid (severe) obesity due to excess calories ([place]) 04/15/2017   Microcytic anemia 04/15/2017   GERD (gastroesophageal reflux disease) 04/15/2017   Chest wall mass    Malignant neoplasm of upper-outer quadrant of right female breast ([place]) 05/25/2015    HTN (hypertension) 05/24/2015      REFERRING DIAG: M70.62 (ICD-10-CM) - Trochanteric bursitis of left hip   THERAPY DIAG:  Pain in right hip   Pain in left hip   Difficulty in walking, not elsewhere classified   Muscle weakness (generalized)   Rationale for Evaluation and Treatment Rehabilitation   PERTINENT HISTORY: Patient is a 51 year old female with complaint of bilateral hip pain, more pain on R at this time due to Hx of injection in L hip. Pt had greater trochanteric injection on her L side on 09/24/21 - this has helped with tolerance of walking. Patient reports moderate referral to her R lateral thigh proximally. Pt denies paresthesias or numbness in lower body. Pt reports pain along posterolateral gluteal region to greater trochanter R>L side. Pt reports that symptoms began about 4-6 months ago and has progressively worsened. Pt reports fall last year without Fx or major injury ("lot of soreness"). Pt reports intermittent disturbed sleep with turning on her side. Patient reports no bowel/bladder changes. Patient reports hx of sciatica when she  was pregnant, though not in recent years. Pt reports numbness with prolonged sitting LLE. Hx of varicose veins.      PRECAUTIONS: None   OBJECTIVE: (objective measures completed at initial evaluation unless otherwise dated)     DIAGNOSTIC FINDINGS:  CT Scan Chest, abomen, pelvis: negative for metastatic disease, mild degenerative change of bilateral hips.   DXA Bone density scan: T-score of 0.7     PATIENT SURVEYS:  FOTO 47 , predicted goal score of 57   COGNITION:           Overall cognitive status: Within functional limits for tasks assessed                          SENSATION: WFL   POSTURE: Overpronation bilat feet, genu valgum   PALPATION: Tenderness to palpation along R>L greater trochanter, gluteus medius 10/23/21: Tenderness to palpation along Right patellar tendon, LCL, MCL, lateral>medial joint line, medial/lateral  hamstrings; Left medial joint line, MCL, medial/lateral hamstrings.     MUSCLE LENGTH (10/23/21) Hamstring 90/90: R WFL, L WFL Ely's Test: R Positive (90 deg knee flexion in prone), L Positive (90 deg knee flexion in prone) Gastrocnemius: R Poor (DF to neutral with knee extended), L Poor (DF to neutral with knee extended)   LOWER EXTREMITY ROM:   Lumbar spine AROM WNL with exception of significant trunk rotation motion loss R and L; back pain with extension, hip pain with trunk rotation in either direction                                Knee/ankle updated 10/23/21 Active ROM Right eval Left eval  Hip flexion 95 95  Hip extension      Hip abduction 20* 40*  Hip adduction      Hip internal rotation 28 30  Hip external rotation 25 22  Knee flexion  100 deg  101 deg  Knee extension  -2 -2   Ankle dorsiflexion To neutral  To neutral   Ankle plantarflexion      Ankle inversion      Ankle eversion       (Blank rows = not tested) Knee and ankle values taken 10/23/21   *Pain with flexion overpressure bilaterally, pain with extension overpressure on L only     LOWER EXTREMITY MMT:   MMT Right eval Left eval  Hip flexion 5 5  Hip extension      Hip abduction 4-* 4  Hip adduction 5 5  Hip internal rotation 4+ 5  Hip external rotation 4+ 4+  Knee flexion 5 4+ (updated 10/23/21)  Knee extension 5 5  Ankle dorsiflexion      Ankle plantarflexion      Ankle inversion      Ankle eversion       (Blank rows = not tested)   LOWER EXTREMITY SPECIAL TESTS:  Hip special tests: Saralyn Pilar (FABER) test: positive  and Hip scouring test: positive , Straight leg raise: Negative bilaterally   10/23/21: McMurray's test: Positive bilaterally      GAIT: Comments: mild compensated Trendelenburg R>L        TODAY'S TREATMENT:   SUBJECTIVE: Patient reports moderate pain affecting R hip and L knee at arrival to PT. She reports compliance with her HEP. She reports poor sleep recently due to  comorbid neck pain without notable paresthesias or upper limb referred symptoms. Pt denies vertigo, visual  changes/diplopia. She does have some gait deficits related to comorbid hip and knee pain. Pt does have history of C-spine radiographs demonstrating degenerative changes.     PAIN:  Are you having pain? Yes, 3/10 pain affecting L knee and R lateral hip Pain location: R>L hip pain laterally, L>R anterior knee pain       Manual Therapy - for symptom modulation, soft tissue sensitivity and mobility, joint mobility, ROM      Bilat long-leg distraction, intermittent; 10 sec on, 5 sec off; x 5 minutes on either side                       Bilateral STM and IASTM with Hypervolt along gluteus medius and minimus; x 3 minutes on each side   STM IASTM with Theraband roller along bilateral quadriceps; x 4 minutes each side  Manual quadriceps stretch in prone; x 60 sec bilaterally               *not today*           Mulligan belt lateral distraction in hooklying and mobilization with movement with conjunct hip ER, IR, and flexion; x 3 minutes bilaterally                        Therapeutic Exercise - for improved soft tissue flexibility and extensibility as needed for ROM, improved strength as needed to improve performance of CKC activities/functional movements; progressive loading of affected tissues to promote tissue remodeling and healing   Supine piriformis stretch; x 60 sec, bilat             Sidelying clamshell; 2x10, bilateral Supine glute bridge; 2x10                  Pt edu: Discussed comorbid neck pain and recommendations for positioning for sleep. Discussed following up with PCP if this is not improving with activity modification and time alone. Discussed benefit of low-impact movement for cartilage/joint health      *not today* Prone Quad stretch; 2x30 sec, bilat Hip abductor and adductor isometrics; x10, 10 sec each direction Supine gastrocnemius stretch     PATIENT  EDUCATION:  Education details:  see above for patient education details   Person educated: Patient Education method: Explanation, Demonstration, and Handouts Education comprehension: verbalized understanding and returned demonstration     HOME EXERCISE PROGRAM: Access Code [email or web address]   ASSESSMENT:   CLINICAL IMPRESSION: Patient has improving knee flexion stiffness and improved bilateral hip ROM. She does still have pain with prolonged walking and sidelying on either side. Pt is doing well with maintaining home program - she could benefit from more regular use of recumbent cycling at home for cartilage health. Pt is also working toward weight loss goals, which will help with pain related to OA. Will progress with low-impact weightbearing drills next visit. Patient has remaining deficits in bilateral hip ROM, hip abductor strength, gait changes, local nociceptive lateral hip pain. Patient will benefit from continued skilled therapeutic intervention to address the above deficits as needed for improved function and QoL.      OBJECTIVE IMPAIRMENTS Abnormal gait, difficulty walking, decreased ROM, decreased strength, hypomobility, and pain.    ACTIVITY LIMITATIONS sitting, standing, squatting, sleeping, stairs, transfers, bed mobility, and locomotion level   PARTICIPATION LIMITATIONS: shopping and community activity   PERSONAL FACTORS Time since onset of injury/illness/exacerbation and 3+ comorbidities: (obesity, HTN, depression, Hx breast cancer, neuropathy)  are also affecting  patient's functional outcome.    REHAB POTENTIAL: Good   CLINICAL DECISION MAKING: Evolving/moderate complexity   EVALUATION COMPLEXITY: Moderate     GOALS:   SHORT TERM GOALS: Target date: 11/07/2021  Patient will be independent and compliant with HEP as needed for symptom modulation, mobility, and strength gains to augment in-clinic PT intervention and improve pt function  Baseline: 10/16/21: Baseline HEP  initiated Goal status: INITIAL     LONG TERM GOALS: Target date: 11/29/2021    Patient will demonstrate improved function as evidenced by a score of 57 on FOTO measure for full participation in activities at home and in the community. Baseline: 10/16/21 47 Goal status: INITIAL   2.  Patient will report NPRS no greater than 2-3/10 with daily activities as needed for improved ability to perform self-care, access community, and participate in desired life roles  Baseline: 718/23: NPRS 6-7/10 at worst  Goal status: INITIAL   3.  Patient will have MMT 5/5 for all tested hip motions indicative of improved strength as needed for improved tolerance to loading affected tissues during prolonged weightbearing and closed-chain work Baseline: 10/16/21: MMT R/L Hip abduction 4-/4, Hip IR 4+/5, Hip ER 4+/4+ Goal status: INITIAL   4.  Patient will sleep through the night without disturbed sleep secondary to hip pain on either side as needed for improved sleep quality and long-term wellness Baseline: 10/16/21: Intermittent disturbed sleep when rolling onto R more so than L side Goal status: INITIAL   5.  Patient will complete errands in town including ambulating around grocery store/shopping center without increase in pain > 2/10 indicative of community-level mobility without increase in hip symptoms Baseline: 10/16/21: Notable pain with walking, pain worsens with higher volume of ambulatory activity.  Goal status: INITIAL       PLAN: PT FREQUENCY: 2x/week   PT DURATION: 6 weeks   PLANNED INTERVENTIONS: Therapeutic exercises, Therapeutic activity, Neuromuscular re-education, Balance training, Gait training, Patient/Family education, Self Care, Joint mobilization, Dry Needling, Electrical stimulation, Cryotherapy, Moist heat, and Manual therapy   PLAN FOR NEXT SESSION: Manual therapy for hip mobility and distraction techniques to modulate hip pain, continued stretching and icing for patient's HEP,  gradually progress loading of posterolateral hip musculature. Progress with hip and hamstring strengthening and knee mobility as able. Will progress into closed-chain over next week pending no regression c pain status.       Valentina Gu, PT, DPT #[id number]  Eilleen Kempf, PT 11/06/2021, 12:42 PM

## 2021-11-07 ENCOUNTER — Encounter (INDEPENDENT_AMBULATORY_CARE_PROVIDER_SITE_OTHER): Payer: Self-pay

## 2021-11-07 ENCOUNTER — Encounter: Payer: Self-pay | Admitting: Physical Therapy

## 2021-11-08 ENCOUNTER — Encounter: Payer: Self-pay | Admitting: Physical Therapy

## 2021-11-08 ENCOUNTER — Ambulatory Visit: Payer: Medicare Other | Admitting: Physical Therapy

## 2021-11-08 DIAGNOSIS — M6281 Muscle weakness (generalized): Secondary | ICD-10-CM

## 2021-11-08 DIAGNOSIS — M25552 Pain in left hip: Secondary | ICD-10-CM

## 2021-11-08 DIAGNOSIS — M25551 Pain in right hip: Secondary | ICD-10-CM

## 2021-11-08 DIAGNOSIS — R262 Difficulty in walking, not elsewhere classified: Secondary | ICD-10-CM

## 2021-11-08 NOTE — Therapy (Signed)
OUTPATIENT PHYSICAL THERAPY TREATMENT NOTE   Patient Name: Michelle Mcmillan MRN: [id number] DOB:1970/12/02, 51 y.o., female Today's Date: 11/08/2021  PCP: Donnie Coffin, MD REFERRING PROVIDER: Esaw Grandchild, NP  END OF SESSION:   PT End of Session - 11/08/21 1058     Visit Number 8    Number of Visits 13    Date for PT Re-Evaluation 11/29/21    Authorization Type UHC Medicare/Medicaid, VL based on medical necessity    Progress Note Due on Visit 10    PT Start Time 1032    PT Stop Time 1118    PT Time Calculation (min) 46 min    Activity Tolerance Patient tolerated treatment well    Behavior During Therapy WFL for tasks assessed/performed             Past Medical History:  Diagnosis Date   Anemia    H/O   Anemia    Arthritis    RIGHT KNEE   Back pain    Breast cancer ([place]) 2017   right breast   Cancer ([place])    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)   Carpal tunnel syndrome    Constipation    Depression    Dyspnea    GERD (gastroesophageal reflux disease)    NO MEDS   Headache    migraines   Hypertension    Joint pain    Last menstrual period (LMP) > 10 days ago 08/2015   Lower extremity edema    Migraines    Neuropathy    OSA (obstructive sleep apnea)    Personal history of radiation therapy    Prediabetes    SOB (shortness of breath)    Past Surgical History:  Procedure Laterality Date   BREAST BIOPSY Right 05/08/2017   invasive mamm. ca   BREAST EXCISIONAL BIOPSY Right 06/15/15   breast cancer   BREAST LUMPECTOMY WITH NEEDLE LOCALIZATION Right 06/15/2015   Procedure: BREAST LUMPECTOMY WITH NEEDLE LOCALIZATION;  Surgeon: Hubbard Robinson, MD;  Location: [facility];  Service: General;  Laterality: Right;   BREAST RECONSTRUCTION WITH PLACEMENT OF TISSUE EXPANDER AND FLEX HD (ACELLULAR HYDRATED DERMIS) Left 02/09/2018   Procedure: BREAST RECONSTRUCTION WITH PLACEMENT OF TISSUE EXPANDER AND FLEX HD (ACELLULAR HYDRATED DERMIS);  Surgeon: Wallace Going, DO;   Location: [facility];  Service: Plastics;  Laterality: Left;   BREAST SURGERY     CESAREAN SECTION  1988   x1   COLONOSCOPY WITH PROPOFOL N/A 01/23/2017   Procedure: COLONOSCOPY WITH PROPOFOL;  Surgeon: Lin Landsman, MD;  Location[facility] ENDOSCOPY;  Service: Gastroenterology;  Laterality: N/A;   ESOPHAGOGASTRODUODENOSCOPY (EGD) WITH PROPOFOL N/A 01/23/2017   Procedure: ESOPHAGOGASTRODUODENOSCOPY (EGD) WITH PROPOFOL;  Surgeon: Lin Landsman, MD;  Location: [facility] ENDOSCOPY;  Service: Gastroenterology;  Laterality: N/A;   LATISSIMUS FLAP TO BREAST Right 06/01/2018   Procedure: RIGHT BREAST RECONSTRUCTION WITH LATISSIMUS MYOCUTANEOUS FLAP;  Surgeon: Wallace Going, DO;  Location: [place];  Service: Plastics;  Laterality: Right;   LIPOSUCTION WITH LIPOFILLING Bilateral 10/15/2018   Procedure: LIPOSUCTION TO BILATERAL BREASTS;  Surgeon: Wallace Going, DO;  Location: [place];  Service: Plastics;  Laterality: Bilateral;   LIPOSUCTION WITH LIPOFILLING Bilateral 04/22/2019   Procedure: fat grafting/liposuction and lipofilling bilateral breasts;  Surgeon: Wallace Going, DO;  Location: [place];  Service: Plastics;  Laterality: Bilateral;   MASS EXCISION N/A 05/20/2016   Procedure: EXCISION CHEST WALL MASS;  Surgeon: Florene Glen, MD;  Location: [facility];  Service: General;  Laterality: N/A;   RECONSTRUCTION BREAST W/ LATISSIMUS DORSI FLAP Right 06/01/2018   REMOVAL OF BILATERAL TISSUE EXPANDERS WITH PLACEMENT OF BILATERAL BREAST IMPLANTS Bilateral 10/15/2018   Procedure: REMOVAL OF BILATERAL TISSUE EXPANDERS WITH PLACEMENT OF BILATERAL BREAST IMPLANTS;  Surgeon: Wallace Going, DO;  Location: [place];  Service: Plastics;  Laterality: Bilateral;   SCAR REVISION Bilateral 04/22/2019   Procedure: Excision of bilateral scar contracture;  Surgeon: Wallace Going, DO;  Location: [place];  Service: Plastics;   Laterality: Bilateral;  total case 90 min   SENTINEL NODE BIOPSY Right 06/15/2015   Procedure: SENTINEL NODE BIOPSY;  Surgeon: Hubbard Robinson, MD;  Location: [facility];  Service: General;  Laterality: Right;   TOTAL MASTECTOMY Right 05/22/2017   Procedure: TOTAL MASTECTOMY;  Surgeon: Florene Glen, MD;  Location: [facility];  Service: General;  Laterality: Right;   TOTAL MASTECTOMY Left 02/09/2018   Procedure: LEFT PROPHYLACTIC  MASTECTOMY;  Surgeon: Jovita Kussmaul, MD;  Location: [facility];  Service: General;  Laterality: Left;   TUBAL LIGATION  2004   ULNAR NERVE TRANSPOSITION Left 01/29/2019   Procedure: LEFT ULNAR NERVE DECOMPRESSION/TRANSPOSITION;  Surgeon: Leanora Cover, MD;  Location: [place];  Service: Orthopedics;  Laterality: Left;   Patient Active Problem List   Diagnosis Date Noted   Genetic testing 11/02/2021   Elevated blood pressure reading in office with white coat syndrome, with diagnosis of hypertension 08/08/2021   Limited joint range of motion (ROM) 07/19/2021   Class 3 severe obesity due to excess calories with body mass index (BMI) of 45.0 to 49.9 in adult ([place]) 06/25/2021   Prediabetes 06/21/2021   Elevated ferritin level 06/21/2021   OSA on CPAP 04/20/2021   Migraine headache without aura 07/21/2020   Ulnar neuropathy 08/28/2018   S/P breast reconstruction, bilateral 06/16/2018   Acquired absence of right breast 06/01/2018   S/P mastectomy, bilateral 02/17/2018   Acquired absence of left breast 02/09/2018   Acquired absence of breast and absent nipple, right 01/02/2018   Postoperative breast asymmetry 01/02/2018   Intractable nausea and vomiting 08/11/2017   Breast cancer ([place]) 05/22/2017   Morbid (severe) obesity due to excess calories ([place]) 04/15/2017   Microcytic anemia 04/15/2017   GERD (gastroesophageal reflux disease) 04/15/2017   Chest wall mass    Malignant neoplasm of upper-outer quadrant of right female breast ([place]) 05/25/2015    HTN (hypertension) 05/24/2015       REFERRING DIAG: M70.62 (ICD-10-CM) - Trochanteric bursitis of left hip   THERAPY DIAG:  Pain in right hip   Pain in left hip   Difficulty in walking, not elsewhere classified   Muscle weakness (generalized)   Rationale for Evaluation and Treatment Rehabilitation   PERTINENT HISTORY: Patient is a 51 year old female with complaint of bilateral hip pain, more pain on R at this time due to Hx of injection in L hip. Pt had greater trochanteric injection on her L side on 09/24/21 - this has helped with tolerance of walking. Patient reports moderate referral to her R lateral thigh proximally. Pt denies paresthesias or numbness in lower body. Pt reports pain along posterolateral gluteal region to greater trochanter R>L side. Pt reports that symptoms began about 4-6 months ago and has progressively worsened. Pt reports fall last year without Fx or major injury ("lot of soreness"). Pt reports intermittent disturbed sleep with turning on her side. Patient reports no bowel/bladder changes. Patient reports hx of sciatica when  she was pregnant, though not in recent years. Pt reports numbness with prolonged sitting LLE. Hx of varicose veins.      PRECAUTIONS: None   OBJECTIVE: (objective measures completed at initial evaluation unless otherwise dated)     DIAGNOSTIC FINDINGS:  CT Scan Chest, abomen, pelvis: negative for metastatic disease, mild degenerative change of bilateral hips.   DXA Bone density scan: T-score of 0.7     PATIENT SURVEYS:  FOTO 47 , predicted goal score of 57   COGNITION:           Overall cognitive status: Within functional limits for tasks assessed                          SENSATION: WFL   POSTURE: Overpronation bilat feet, genu valgum   PALPATION: Tenderness to palpation along R>L greater trochanter, gluteus medius 10/23/21: Tenderness to palpation along Right patellar tendon, LCL, MCL, lateral>medial joint line, medial/lateral  hamstrings; Left medial joint line, MCL, medial/lateral hamstrings.     MUSCLE LENGTH (10/23/21) Hamstring 90/90: R WFL, L WFL Ely's Test: R Positive (90 deg knee flexion in prone), L Positive (90 deg knee flexion in prone) Gastrocnemius: R Poor (DF to neutral with knee extended), L Poor (DF to neutral with knee extended)   LOWER EXTREMITY ROM:   Lumbar spine AROM WNL with exception of significant trunk rotation motion loss R and L; back pain with extension, hip pain with trunk rotation in either direction                                Knee/ankle updated 10/23/21 Active ROM Right eval Left eval  Hip flexion 95 95  Hip extension      Hip abduction 20* 40*  Hip adduction      Hip internal rotation 28 30  Hip external rotation 25 22  Knee flexion  100 deg  101 deg  Knee extension  -2 -2   Ankle dorsiflexion To neutral  To neutral   Ankle plantarflexion      Ankle inversion      Ankle eversion       (Blank rows = not tested) Knee and ankle values taken 10/23/21   *Pain with flexion overpressure bilaterally, pain with extension overpressure on L only     LOWER EXTREMITY MMT:   MMT Right eval Left eval  Hip flexion 5 5  Hip extension      Hip abduction 4-* 4  Hip adduction 5 5  Hip internal rotation 4+ 5  Hip external rotation 4+ 4+  Knee flexion 5 4+ (updated 10/23/21)  Knee extension 5 5  Ankle dorsiflexion      Ankle plantarflexion      Ankle inversion      Ankle eversion       (Blank rows = not tested)   LOWER EXTREMITY SPECIAL TESTS:  Hip special tests: Saralyn Pilar (FABER) test: positive  and Hip scouring test: positive , Straight leg raise: Negative bilaterally   10/23/21: McMurray's test: Positive bilaterally      GAIT: Comments: mild compensated Trendelenburg R>L        TODAY'S TREATMENT:   SUBJECTIVE: Patient reports pain along R lateral knee mainly at arrival. Patient reports compliance with HEP. She reports mild soreness after last visit. Patient  reports more aching in her knee with inclement weather.      PAIN:  Are you  having pain? Yes, 3/10 pain, affecting R lateral knee primarily        Manual Therapy - for symptom modulation, soft tissue sensitivity and mobility, joint mobility, ROM     R long-leg distraction, intermittent; 10 sec on, 5 sec off; x 4 minutes                                 STM IASTM with Theraband roller along R vastus lateralis; x 4 minutes             Manual quadriceps stretch in prone; x 60 sec bilaterally               *not today*              Mulligan belt lateral distraction in hooklying and mobilization with movement with conjunct hip ER, IR, and flexion; x 3 minutes bilaterally              Bilateral STM and IASTM with Hypervolt along gluteus medius and minimus; x 3 minutes on each side         Therapeutic Exercise - for improved soft tissue flexibility and extensibility as needed for ROM, improved strength as needed to improve performance of CKC activities/functional movements; progressive loading of affected tissues to promote tissue remodeling and healing                Sidelying clamshell; 2x10, bilateral Supine glute bridge; 2x10 NuStep; 5 minutes, Level 2, seat at 11, arms at 9 Minisquat; 2x8, hands hovering along bilateral handrails, PT guarding posteriorly for pt safety Lateral step up; 1x10 on each LE, 6-inch step, staircase in center of gym               *not today* Supine piriformis stretch; x 60 sec, bilat Prone Quad stretch; 2x30 sec, bilat Hip abductor and adductor isometrics; x10, 10 sec each direction Supine gastrocnemius stretch     PATIENT EDUCATION:  Education details:  see above for patient education details   Person educated: Patient Education method: Explanation, Demonstration, and Handouts Education comprehension: verbalized understanding and returned demonstration     HOME EXERCISE PROGRAM: Access Code [email or web address]   ASSESSMENT:   CLINICAL  IMPRESSION: Patient tolerates weightbearing work well today, with primary complaint of feeling "wobbly" with lateral step-up with LLE. Patient has primary complaint of R lateral knee pain at arrival with taut and tender vastus lateralis. Pt has ongoing quadrieps tightness as demonstrated by Ely's test in prone. Pt tolerates manual therapy well today and is able to progress with non-weightbearing and weightbearing gluteal and hamstrings isotonics without increase in pain. Patient has remaining deficits in bilateral hip ROM, hip abductor strength, gait changes, local nociceptive lateral hip pain. Patient will benefit from continued skilled therapeutic intervention to address the above deficits as needed for improved function and QoL.      OBJECTIVE IMPAIRMENTS Abnormal gait, difficulty walking, decreased ROM, decreased strength, hypomobility, and pain.    ACTIVITY LIMITATIONS sitting, standing, squatting, sleeping, stairs, transfers, bed mobility, and locomotion level   PARTICIPATION LIMITATIONS: shopping and community activity   PERSONAL FACTORS Time since onset of injury/illness/exacerbation and 3+ comorbidities: (obesity, HTN, depression, Hx breast cancer, neuropathy)  are also affecting patient's functional outcome.    REHAB POTENTIAL: Good   CLINICAL DECISION MAKING: Evolving/moderate complexity   EVALUATION COMPLEXITY: Moderate     GOALS:   SHORT TERM GOALS: Target date: 11/07/2021  Patient  will be independent and compliant with HEP as needed for symptom modulation, mobility, and strength gains to augment in-clinic PT intervention and improve pt function  Baseline: 10/16/21: Baseline HEP initiated Goal status: INITIAL     LONG TERM GOALS: Target date: 11/29/2021    Patient will demonstrate improved function as evidenced by a score of 57 on FOTO measure for full participation in activities at home and in the community. Baseline: 10/16/21 47 Goal status: INITIAL   2.  Patient will  report NPRS no greater than 2-3/10 with daily activities as needed for improved ability to perform self-care, access community, and participate in desired life roles  Baseline: 718/23: NPRS 6-7/10 at worst  Goal status: INITIAL   3.  Patient will have MMT 5/5 for all tested hip motions indicative of improved strength as needed for improved tolerance to loading affected tissues during prolonged weightbearing and closed-chain work Baseline: 10/16/21: MMT R/L Hip abduction 4-/4, Hip IR 4+/5, Hip ER 4+/4+ Goal status: INITIAL   4.  Patient will sleep through the night without disturbed sleep secondary to hip pain on either side as needed for improved sleep quality and long-term wellness Baseline: 10/16/21: Intermittent disturbed sleep when rolling onto R more so than L side Goal status: INITIAL   5.  Patient will complete errands in town including ambulating around grocery store/shopping center without increase in pain > 2/10 indicative of community-level mobility without increase in hip symptoms Baseline: 10/16/21: Notable pain with walking, pain worsens with higher volume of ambulatory activity.  Goal status: INITIAL       PLAN: PT FREQUENCY: 2x/week   PT DURATION: 6 weeks   PLANNED INTERVENTIONS: Therapeutic exercises, Therapeutic activity, Neuromuscular re-education, Balance training, Gait training, Patient/Family education, Self Care, Joint mobilization, Dry Needling, Electrical stimulation, Cryotherapy, Moist heat, and Manual therapy   PLAN FOR NEXT SESSION: Manual therapy for hip mobility and distraction techniques to modulate hip pain, continued stretching and icing for patient's HEP, gradually progress loading of posterolateral hip musculature. Progress with hip and hamstring strengthening and knee mobility as able, continue with progressive closed-chain strengthening.       Valentina Gu, PT, DPT #[id number]  Eilleen Kempf, PT 11/08/2021, 11:31 AM

## 2021-11-12 ENCOUNTER — Ambulatory Visit: Payer: Medicare Other | Admitting: Physical Therapy

## 2021-11-12 DIAGNOSIS — M25551 Pain in right hip: Secondary | ICD-10-CM | POA: Diagnosis not present

## 2021-11-12 DIAGNOSIS — M6281 Muscle weakness (generalized): Secondary | ICD-10-CM

## 2021-11-12 DIAGNOSIS — M25552 Pain in left hip: Secondary | ICD-10-CM

## 2021-11-12 DIAGNOSIS — R262 Difficulty in walking, not elsewhere classified: Secondary | ICD-10-CM

## 2021-11-12 NOTE — Progress Notes (Unsigned)
Chief Complaint:   OBESITY Michelle Mcmillan is here to discuss her progress with her obesity treatment plan along with follow-up of her obesity related diagnoses. [name] is on the Category 3 Plan and the [place] + 300 calories and states she is following her eating plan approximately 90% of the time. Michelle Mcmillan states she is walking 15 minutes 2 times per week.  Today's visit was #: 9 Starting weight: 333 lbs Starting date: 06/20/2021 Today's weight: 312 lbs Today's date: 11/05/2021 Total lbs lost to date: 21 Total lbs lost since last in-office visit: 1  Interim History: This is Michelle Mcmillan's first OV with me. She ha previously been seen by Dr. Owens Shark. Pt reports it is difficult to eat all the protein. She is bored a little.  Subjective:   1. Vitamin D deficiency She is currently taking OTC calcium/vitamin D each day. She denies nausea, vomiting or muscle weakness.  2. Essential hypertension Michelle Mcmillan is tolerating medication(s) well without side effects.  Medication compliance is good as patient endorses taking it as prescribed.  The patient denies additional concerns regarding this condition.      3. Gastroesophageal reflux disease, unspecified whether esophagitis present Pt reports she has symptoms if she eats too late at night before lying down. Medication: Prilosec  Assessment/Plan:  No orders of the defined types were placed in this encounter.   There are no discontinued medications.   No orders of the defined types were placed in this encounter.    1. Vitamin D deficiency Low Vitamin D level contributes to fatigue and are associated with obesity, breast, and colon cancer. She agrees to continue calcium-Vitamin D and will follow-up for routine testing of Vitamin D, at least 2-3 times per year to avoid over-replacement.  2. Essential hypertension BP stable and within normal limits. Vernel is working on healthy weight loss and exercise to improve blood pressure control.  We will watch for signs of hypotension as she continues her lifestyle modifications. Pt is on HCTZ and denies need for refill. Decrease salt and increase water intake.  3. Gastroesophageal reflux disease, unspecified whether esophagitis present Intensive lifestyle modifications are the first line treatment for this issue. We discussed several lifestyle modifications today and she will continue to work on diet, exercise and weight loss efforts. Orders and follow up as documented in patient record. Continue meds and avoid eating and lying down within 3 hours. Counseling done.  Counseling If a person has gastroesophageal reflux disease (GERD), food and stomach acid move back up into the esophagus and cause symptoms or problems such as damage to the esophagus. Anti-reflux measures include: raising the head of the bed, avoiding tight clothing or belts, avoiding eating late at night, not lying down shortly after mealtime, and achieving weight loss. Avoid ASA, NSAID's, caffeine, alcohol, and tobacco.  OTC Pepcid and/or Tums are often very helpful for as needed use.  However, for persisting chronic or daily symptoms, stronger medications like Omeprazole may be needed. You may need to avoid foods and drinks such as: Coffee and tea (with or without caffeine). Drinks that contain alcohol. Energy drinks and sports drinks. Bubbly (carbonated) drinks or sodas. Chocolate and cocoa. Peppermint and mint flavorings. Garlic and onions. Horseradish. Spicy and acidic foods. These include peppers, chili powder, curry powder, vinegar, hot sauces, and BBQ sauce. Citrus fruit juices and citrus fruits, such as oranges, lemons, and limes. Tomato-based foods. These include red sauce, chili, salsa, and pizza with red sauce. Fried and fatty foods. These  include donuts, french fries, potato chips, and high-fat dressings. High-fat meats. These include hot dogs, rib eye steak, sausage, ham, and bacon.  4. Obesity, Current  BMI is 46.1 Michelle Mcmillan is currently in the action stage of change. As such, her goal is to continue with weight loss efforts. She has agreed to the Category 3 Plan and the [place] + 300 calories.   Long discussion had with pt on how to make foods more exciting and healthy ways to cook what she loves. [name] sauces and salad dressings reviewed with pt.  Exercise goals:  As is  Behavioral modification strategies: no skipping meals and meal planning and cooking strategies.  [name] has agreed to follow-up with our clinic in 3 weeks with Dr. Owens Shark. She was informed of the importance of frequent follow-up visits to maximize her success with intensive lifestyle modifications for her multiple health conditions.   Objective:   Blood pressure 118/80, pulse 62, temperature 98.2 F (36.8 C), height '5\' 9"'$  (1.753 m), weight (!) 312 lb (141.5 kg), SpO2 97 %. Body mass index is 46.07 kg/m.  General: Cooperative, alert, well developed, in no acute distress. HEENT: Conjunctivae and lids unremarkable. Cardiovascular: Regular rhythm.  Lungs: Normal work of breathing. Neurologic: No focal deficits.   Lab Results  Component Value Date   CREATININE 1.03 (H) 09/06/2021   BUN 21 (H) 09/06/2021   NA 142 09/06/2021   K 4.1 09/06/2021   CL 103 09/06/2021   CO2 30 09/06/2021   Lab Results  Component Value Date   ALT 27 09/06/2021   AST 28 09/06/2021   ALKPHOS 95 09/06/2021   BILITOT 0.4 09/06/2021   Lab Results  Component Value Date   HGBA1C 6.0 (H) 06/20/2021   Lab Results  Component Value Date   INSULIN 11.8 06/20/2021   Lab Results  Component Value Date   TSH 2.020 06/20/2021   Lab Results  Component Value Date   CHOL 179 06/20/2021   HDL 65 06/20/2021   LDLCALC 105 (H) 06/20/2021   TRIG 47 06/20/2021   Lab Results  Component Value Date   VD25OH 45.2 06/20/2021   Lab Results  Component Value Date   WBC 7.1 09/06/2021   HGB 12.7 09/06/2021   HCT 41.0 09/06/2021    MCV 84.4 09/06/2021   PLT 239 09/06/2021   Lab Results  Component Value Date   IRON 49 09/06/2021   TIBC 273 09/06/2021   FERRITIN 224 09/06/2021    Obesity Behavioral Intervention:   Approximately 15 minutes were spent on the discussion below.  ASK: We discussed the diagnosis of obesity with [name] today and Lynnett agreed to give [place] permission to discuss obesity behavioral modification therapy today.  ASSESS: Katalea has the diagnosis of obesity and her BMI today is 46.1. Enriqueta is in the action stage of change.   ADVISE: Tyleah was educated on the multiple health risks of obesity as well as the benefit of weight loss to improve her health. She was advised of the need for long term treatment and the importance of lifestyle modifications to improve her current health and to decrease her risk of future health problems.  AGREE: Multiple dietary modification options and treatment options were discussed and Malani agreed to follow the recommendations documented in the above note.  ARRANGE: Kendel was educated on the importance of frequent visits to treat obesity as outlined per CMS and USPSTF guidelines and agreed to schedule her next follow up appointment today.  Attestation Statements:  Reviewed by clinician on day of visit: allergies, medications, problem list, medical history, surgical history, family history, social history, and previous encounter notes.  I, Kathlene November, BS, CMA, am acting as transcriptionist for [employer], DO.   I have reviewed the above documentation for accuracy and completeness, and I agree with the above. [name], D.O.  The [place] was signed into law in 2016 which includes the topic of electronic health records.  This provides immediate access to information in MyChart.  This includes consultation notes, operative notes, office notes, lab results and pathology reports.  If you have any questions about what you  read please let us know at your next visit so we can discuss your concerns and take corrective action if need be.  We are right here with you.

## 2021-11-12 NOTE — Therapy (Signed)
OUTPATIENT PHYSICAL THERAPY TREATMENT NOTE   Patient Name: Michelle Mcmillan MRN: [id number] DOB:1970-08-23, 51 y.o., female Today's Date: 11/12/2021  PCP: Donnie Coffin, MD REFERRING PROVIDER: Esaw Grandchild, NP  END OF SESSION:   PT End of Session - 11/12/21 1322     Visit Number 9    Number of Visits 13    Date for PT Re-Evaluation 11/29/21    Authorization Type UHC Medicare/Medicaid, VL based on medical necessity    Progress Note Due on Visit 10    PT Start Time 1149    PT Stop Time 1235    PT Time Calculation (min) 46 min    Activity Tolerance Patient tolerated treatment well    Behavior During Therapy [facility] for tasks assessed/performed             Past Medical History:  Diagnosis Date   Anemia    H/O   Anemia    Arthritis    RIGHT KNEE   Back pain    Breast cancer ([place]) 2017   right breast   Cancer ([place])    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)   Carpal tunnel syndrome    Constipation    Depression    Dyspnea    GERD (gastroesophageal reflux disease)    NO MEDS   Headache    migraines   Hypertension    Joint pain    Last menstrual period (LMP) > 10 days ago 08/2015   Lower extremity edema    Migraines    Neuropathy    OSA (obstructive sleep apnea)    Personal history of radiation therapy    Prediabetes    SOB (shortness of breath)    Past Surgical History:  Procedure Laterality Date   BREAST BIOPSY Right 05/08/2017   invasive mamm. ca   BREAST EXCISIONAL BIOPSY Right 06/15/15   breast cancer   BREAST LUMPECTOMY WITH NEEDLE LOCALIZATION Right 06/15/2015   Procedure: BREAST LUMPECTOMY WITH NEEDLE LOCALIZATION;  Surgeon: Hubbard Robinson, MD;  Location: [facility];  Service: General;  Laterality: Right;   BREAST RECONSTRUCTION WITH PLACEMENT OF TISSUE EXPANDER AND FLEX HD (ACELLULAR HYDRATED DERMIS) Left 02/09/2018   Procedure: BREAST RECONSTRUCTION WITH PLACEMENT OF TISSUE EXPANDER AND FLEX HD (ACELLULAR HYDRATED DERMIS);  Surgeon: Wallace Going, DO;   Location: [facility];  Service: Plastics;  Laterality: Left;   BREAST SURGERY     CESAREAN SECTION  1988   x1   COLONOSCOPY WITH PROPOFOL N/A 01/23/2017   Procedure: COLONOSCOPY WITH PROPOFOL;  Surgeon: Lin Landsman, MD;  Location: [facility] END[facility]COPY;  Service: Gastroenterology;  Laterality: N/A;   ESOPHAGOGASTRODUODENOSCOPY (EGD) WITH PROPOFOL N/A 01/23/2017   Procedure: ESOPHAGOGASTRODUODENOSCOPY (EGD) WITH PROPOFOL;  Surgeon: Lin Landsman, MD;  Location: [facility] ENDOSCOPY;  Service: Gastroenterology;  Laterality: N/A;   LATISSIMUS FLAP TO BREAST Right 06/01/2018   Procedure: RIGHT BREAST RECONSTRUCTION WITH LATISSIMUS MYOCUTANEOUS FLAP;  Surgeon: Wallace Going, DO;  Location: [place];  Service: Plastics;  Laterality: Right;   LIPOSUCTION WITH LIPOFILLING Bilateral 10/15/2018   Procedure: LIPOSUCTION TO BILATERAL BREASTS;  Surgeon: Wallace Going, DO;  Location: [place];  Service: Plastics;  Laterality: Bilateral;   LIPOSUCTION WITH LIPOFILLING Bilateral 04/22/2019   Procedure: fat grafting/liposuction and lipofilling bilateral breasts;  Surgeon: Wallace Going, DO;  Location: [place];  Service: Plastics;  Laterality: Bilateral;   MASS EXCISION N/A 05/20/2016   Procedure: EXCISION CHEST WALL MASS;  Surgeon: Florene Glen, MD;  Location: [facility];  Service: General;  Laterality: N/A;   RECONSTRUCTION BREAST W/ LATISSIMUS DORSI FLAP Right 06/01/2018   REMOVAL OF BILATERAL TISSUE EXPANDERS WITH PLACEMENT OF BILATERAL BREAST IMPLANTS Bilateral 10/15/2018   Procedure: REMOVAL OF BILATERAL TISSUE EXPANDERS WITH PLACEMENT OF BILATERAL BREAST IMPLANTS;  Surgeon: Wallace Going, DO;  Location: [place];  Service: Plastics;  Laterality: Bilateral;   SCAR REVISION Bilateral 04/22/2019   Procedure: Excision of bilateral scar contracture;  Surgeon: Wallace Going, DO;  Location: [place];  Service: Plastics;   Laterality: Bilateral;  total case 90 min   SENTINEL NODE BIOPSY Right 06/15/2015   Procedure: SENTINEL NODE BIOPSY;  Surgeon: Hubbard Robinson, MD;  Location: [facility];  Service: General;  Laterality: Right;   TOTAL MASTECTOMY Right 05/22/2017   Procedure: TOTAL MASTECTOMY;  Surgeon: Florene Glen, MD;  Location: [facility];  Service: General;  Laterality: Right;   TOTAL MASTECTOMY Left 02/09/2018   Procedure: LEFT PROPHYLACTIC  MASTECTOMY;  Surgeon: Jovita Kussmaul, MD;  Location: [facility];  Service: General;  Laterality: Left;   TUBAL LIGATION  2004   ULNAR NERVE TRANSPOSITION Left 01/29/2019   Procedure: LEFT ULNAR NERVE DECOMPRESSION/TRANSPOSITION;  Surgeon: Leanora Cover, MD;  Location: [place];  Service: Orthopedics;  Laterality: Left;   Patient Active Problem List   Diagnosis Date Noted   Genetic testing 11/02/2021   Elevated blood pressure reading in office with white coat syndrome, with diagnosis of hypertension 08/08/2021   Limited joint range of motion (ROM) 07/19/2021   Class 3 severe obesity due to excess calories with body mass index (BMI) of 45.0 to 49.9 in adult ([place]) 06/25/2021   Prediabetes 06/21/2021   Elevated ferritin level 06/21/2021   OSA on CPAP 04/20/2021   Migraine headache without aura 07/21/2020   Ulnar neuropathy 08/28/2018   S/P breast reconstruction, bilateral 06/16/2018   Acquired absence of right breast 06/01/2018   S/P mastectomy, bilateral 02/17/2018   Acquired absence of left breast 02/09/2018   Acquired absence of breast and absent nipple, right 01/02/2018   Postoperative breast asymmetry 01/02/2018   Intractable nausea and vomiting 08/11/2017   Breast cancer ([place]) 05/22/2017   Morbid (severe) obesity due to excess calories ([place]) 04/15/2017   Microcytic anemia 04/15/2017   GERD (gastroesophageal reflux disease) 04/15/2017   Chest wall mass    Malignant neoplasm of upper-outer quadrant of right female breast ([place]) 05/25/2015    HTN (hypertension) 05/24/2015      REFERRING DIAG: M70.62 (ICD-10-CM) - Trochanteric bursitis of left hip   THERAPY DIAG:  Pain in right hip   Pain in left hip   Difficulty in walking, not elsewhere classified   Muscle weakness (generalized)   Rationale for Evaluation and Treatment Rehabilitation   PERTINENT HISTORY: Patient is a 51 year old female with complaint of bilateral hip pain, more pain on R at this time due to Hx of injection in L hip. Pt had greater trochanteric injection on her L side on 09/24/21 - this has helped with tolerance of walking. Patient reports moderate referral to her R lateral thigh proximally. Pt denies paresthesias or numbness in lower body. Pt reports pain along posterolateral gluteal region to greater trochanter R>L side. Pt reports that symptoms began about 4-6 months ago and has progressively worsened. Pt reports fall last year without Fx or major injury ("lot of soreness"). Pt reports intermittent disturbed sleep with turning on her side. Patient reports no bowel/bladder changes. Patient reports hx of sciatica when she  was pregnant, though not in recent years. Pt reports numbness with prolonged sitting LLE. Hx of varicose veins.      PRECAUTIONS: None   OBJECTIVE: (objective measures completed at initial evaluation unless otherwise dated)     DIAGNOSTIC FINDINGS:  CT Scan Chest, abomen, pelvis: negative for metastatic disease, mild degenerative change of bilateral hips.   DXA Bone density scan: T-score of 0.7     PATIENT SURVEYS:  FOTO 47 , predicted goal score of 57   COGNITION:           Overall cognitive status: Within functional limits for tasks assessed                          SENSATION: WFL   POSTURE: Overpronation bilat feet, genu valgum   PALPATION: Tenderness to palpation along R>L greater trochanter, gluteus medius 10/23/21: Tenderness to palpation along Right patellar tendon, LCL, MCL, lateral>medial joint line, medial/lateral  hamstrings; Left medial joint line, MCL, medial/lateral hamstrings.     MUSCLE LENGTH (10/23/21) Hamstring 90/90: R WFL, L WFL Ely's Test: R Positive (90 deg knee flexion in prone), L Positive (90 deg knee flexion in prone) Gastrocnemius: R Poor (DF to neutral with knee extended), L Poor (DF to neutral with knee extended)   LOWER EXTREMITY ROM:   Lumbar spine AROM WNL with exception of significant trunk rotation motion loss R and L; back pain with extension, hip pain with trunk rotation in either direction                                Knee/ankle updated 10/23/21 Active ROM Right eval Left eval  Hip flexion 95 95  Hip extension      Hip abduction 20* 40*  Hip adduction      Hip internal rotation 28 30  Hip external rotation 25 22  Knee flexion  100 deg  101 deg  Knee extension  -2 -2   Ankle dorsiflexion To neutral  To neutral   Ankle plantarflexion      Ankle inversion      Ankle eversion       (Blank rows = not tested) Knee and ankle values taken 10/23/21   *Pain with flexion overpressure bilaterally, pain with extension overpressure on L only     LOWER EXTREMITY MMT:   MMT Right eval Left eval  Hip flexion 5 5  Hip extension      Hip abduction 4-* 4  Hip adduction 5 5  Hip internal rotation 4+ 5  Hip external rotation 4+ 4+  Knee flexion 5 4+ (updated 10/23/21)  Knee extension 5 5  Ankle dorsiflexion      Ankle plantarflexion      Ankle inversion      Ankle eversion       (Blank rows = not tested)   LOWER EXTREMITY SPECIAL TESTS:  Hip special tests: Saralyn Pilar (FABER) test: positive  and Hip scouring test: positive , Straight leg raise: Negative bilaterally   10/23/21: McMurray's test: Positive bilaterally      GAIT: Comments: mild compensated Trendelenburg R>L        TODAY'S TREATMENT:   SUBJECTIVE: Patient reports doing well Thursday afternoon, but she had significant flare-up Friday after significant time spent weightbearing/walking around her home  organizing. She reports notable pain Saturday AM. She reports limited walking Friday-Saturday due to pain. Patient reports having to use her walker  for walking during this time. She repots that symptoms have since settled. She reports moderate L gluteal pain and significant pain along L lateral leg at 7/10 intensity.      PAIN:  Are you having pain? Yes, 4/10 pain, affecting R lateral knee primarily         Manual Therapy - for symptom modulation, soft tissue sensitivity and mobility, joint mobility, ROM      Bilateral long-leg distraction with Mulligan belt, intermittent; 10 sec on, 5 sec off; x 8 minutes                                 STM IASTM with Theraband roller along L lateral gastrocemius and peroneal mm; x 4 minutes   IASTM with Hypervolt along L gluteus medius; x 8 minutes                            *not today* Manual quadriceps stretch in prone; x 60 sec bilaterally              Mulligan belt lateral distraction in hooklying and mobilization with movement with conjunct hip ER, IR, and flexion; x 3 minutes bilaterally              Bilateral STM and IASTM with Hypervolt along gluteus medius and minimus; x 3 minutes on each side         Therapeutic Exercise - for improved soft tissue flexibility and extensibility as needed for ROM, improved strength as needed to improve performance of CKC activities/functional movements; progressive loading of affected tissues to promote tissue remodeling and healing   NuStep; 5 minutes, Level 2, seat at 11, arms at 9            Supine glute bridge; 2x10, with Blue Tband looped around BLE superior to knees Supine piriformis stretch; 2x30 sec, bilat Standing gastrocemius stretch, lunge at wall; 2x30 sec bilateral  PATIENT EDUCATION: Dicussed positioning strategies for comfort during sleeping at night and reviewed products available to improve comfort. Discussed activity modification given recent flare-up of symptoms at end of last week.     *next visit*   Sidelying clamshell; 2x10, bilateral   Minisquat; 2x8, hands hovering along bilateral handrails, PT guarding posteriorly for pt safety   *hold today* Lateral step up; 1x10 on each LE, 6-inch step, staircase in center of gym               *not today* Prone Quad stretch; 2x30 sec, bilat Hip abductor and adductor isometrics; x10, 10 sec each direction Supine gastrocnemius stretch     PATIENT EDUCATION:  Education details:  see above for patient education details   Person educated: Patient Education method: Explanation, Demonstration, and Handouts Education comprehension: verbalized understanding and returned demonstration     HOME EXERCISE PROGRAM: Access Code [email or web address]   ASSESSMENT:   CLINICAL IMPRESSION: Patient reports doing well this past Thursday, but she had notable pain after performing ample activity on Friday with pt walking around her house for several hours cleaning and organizing. Pt reports debilitating pain between Friday to Saturday morning which limited her ability to walk - she had to use walker to go to bathroom. This has fortunately improved since then, but pt will need to decrease volume of load-bearing activity to prevent future flare-ups. Discussed decreasing total volume of this type of work and completing tasks with intermittent breaks  versus completing without pausing. Pt does well with manual intervention today. Will resume more closed-chain drills next visit pending continued progress c patient's condition. Patient has remaining deficits in bilateral hip ROM, hip abductor strength, gait changes, local nociceptive lateral hip pain. Patient will benefit from continued skilled therapeutic intervention to address the above deficits as needed for improved function and QoL.      OBJECTIVE IMPAIRMENTS Abnormal gait, difficulty walking, decreased ROM, decreased strength, hypomobility, and pain.    ACTIVITY LIMITATIONS sitting, standing,  squatting, sleeping, stairs, transfers, bed mobility, and locomotion level   PARTICIPATION LIMITATIONS: shopping and community activity   PERSONAL FACTORS Time since onset of injury/illness/exacerbation and 3+ comorbidities: (obesity, HTN, depression, Hx breast cancer, neuropathy)  are also affecting patient's functional outcome.    REHAB POTENTIAL: Good   CLINICAL DECISION MAKING: Evolving/moderate complexity   EVALUATION COMPLEXITY: Moderate     GOALS:   SHORT TERM GOALS: Target date: 11/07/2021  Patient will be independent and compliant with HEP as needed for symptom modulation, mobility, and strength gains to augment in-clinic PT intervention and improve pt function  Baseline: 10/16/21: Baseline HEP initiated Goal status: INITIAL     LONG TERM GOALS: Target date: 11/29/2021    Patient will demonstrate improved function as evidenced by a score of 57 on FOTO measure for full participation in activities at home and in the community. Baseline: 10/16/21 47 Goal status: INITIAL   2.  Patient will report NPRS no greater than 2-3/10 with daily activities as needed for improved ability to perform self-care, access community, and participate in desired life roles  Baseline: 718/23: NPRS 6-7/10 at worst  Goal status: INITIAL   3.  Patient will have MMT 5/5 for all tested hip motions indicative of improved strength as needed for improved tolerance to loading affected tissues during prolonged weightbearing and closed-chain work Baseline: 10/16/21: MMT R/L Hip abduction 4-/4, Hip IR 4+/5, Hip ER 4+/4+ Goal status: INITIAL   4.  Patient will sleep through the night without disturbed sleep secondary to hip pain on either side as needed for improved sleep quality and long-term wellness Baseline: 10/16/21: Intermittent disturbed sleep when rolling onto R more so than L side Goal status: INITIAL   5.  Patient will complete errands in town including ambulating around grocery store/shopping center  without increase in pain > 2/10 indicative of community-level mobility without increase in hip symptoms Baseline: 10/16/21: Notable pain with walking, pain worsens with higher volume of ambulatory activity.  Goal status: INITIAL       PLAN: PT FREQUENCY: 2x/week   PT DURATION: 6 weeks   PLANNED INTERVENTIONS: Therapeutic exercises, Therapeutic activity, Neuromuscular re-education, Balance training, Gait training, Patient/Family education, Self Care, Joint mobilization, Dry Needling, Electrical stimulation, Cryotherapy, Moist heat, and Manual therapy   PLAN FOR NEXT SESSION: Manual therapy for hip mobility and distraction techniques to modulate hip pain, continued stretching and icing for patient's HEP, gradually progress loading of posterolateral hip musculature. Progress with hip and hamstring strengthening and knee mobility as able, continue with progressive closed-chain strengthening. Re-assessment next visit.      Valentina Gu, PT, DPT #[id number]  Eilleen Kempf, PT 11/12/2021, 1:22 PM

## 2021-11-13 ENCOUNTER — Ambulatory Visit: Payer: Medicare Other | Admitting: Physical Therapy

## 2021-11-14 ENCOUNTER — Ambulatory Visit: Payer: Medicare Other | Admitting: Physical Therapy

## 2021-11-14 DIAGNOSIS — R262 Difficulty in walking, not elsewhere classified: Secondary | ICD-10-CM

## 2021-11-14 DIAGNOSIS — M25552 Pain in left hip: Secondary | ICD-10-CM

## 2021-11-14 DIAGNOSIS — M25551 Pain in right hip: Secondary | ICD-10-CM

## 2021-11-14 DIAGNOSIS — M6281 Muscle weakness (generalized): Secondary | ICD-10-CM

## 2021-11-14 NOTE — Therapy (Signed)
OUTPATIENT PHYSICAL THERAPY TREATMENT NOTE   Patient Name: Michelle Mcmillan MRN: [id number] DOB:September 28, 1970, 51 y.o., female Today's Date: 11/14/2021  PCP: Donnie Coffin, MD REFERRING PROVIDER: Donnie Coffin, MD  END OF SESSION:    Past Medical History:  Diagnosis Date   Anemia    H/O   Anemia    Arthritis    RIGHT KNEE   Back pain    Breast cancer ([place]) 2017   right breast   Cancer ([place])    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)   Carpal tunnel syndrome    Constipation    Depression    Dyspnea    GERD (gastroesophageal reflux disease)    NO MEDS   Headache    migraines   Hypertension    Joint pain    Last menstrual period (LMP) > 10 days ago 08/2015   Lower extremity edema    Migraines    Neuropathy    OSA (obstructive sleep apnea)    Personal history of radiation therapy    Prediabetes    SOB (shortness of breath)    Past Surgical History:  Procedure Laterality Date   BREAST BIOPSY Right 05/08/2017   invasive mamm. ca   BREAST EXCISIONAL BIOPSY Right 06/15/15   breast cancer   BREAST LUMPECTOMY WITH NEEDLE LOCALIZATION Right 06/15/2015   Procedure: BREAST LUMPECTOMY WITH NEEDLE LOCALIZATION;  Surgeon: Hubbard Robinson, MD;  Location: [facility];  Service: General;  Laterality: Right;   BREAST RECONSTRUCTION WITH PLACEMENT OF TISSUE EXPANDER AND FLEX HD (ACELLULAR HYDRATED DERMIS) Left 02/09/2018   Procedure: BREAST RECONSTRUCTION WITH PLACEMENT OF TISSUE EXPANDER AND FLEX HD (ACELLULAR HYDRATED DERMIS);  Surgeon: Wallace Going, DO;  Location: [facility];  Service: Plastics;  Laterality: Left;   BREAST SURGERY     CESAREAN SECTION  1988   x1   COLONOSCOPY WITH PROPOFOL N/A 01/23/2017   Procedure: COLONOSCOPY WITH PROPOFOL;  Surgeon: Lin Landsman, MD;  Location: [facility];  Service: Gastroenterology;  Laterality: N/A;   ESOPHAGOGASTRODUODENOSCOPY (EGD) WITH PROPOFOL N/A 01/23/2017   Procedure: ESOPHAGOGASTRODUODENOSCOPY (EGD) WITH PROPOFOL;  Surgeon: Lin Landsman, MD;  Location: [facility] ENDOSCOPY;  Service: Gastroenterology;  Laterality: N/A;   LATISSIMUS FLAP TO BREAST Right 06/01/2018   Procedure: RIGHT BREAST RECONSTRUCTION WITH LATISSIMUS MYOCUTANEOUS FLAP;  Surgeon: Wallace Going, DO;  Location: [place];  Service: Plastics;  Laterality: Right;   LIPOSUCTION WITH LIPOFILLING Bilateral 10/15/2018   Procedure: LIPOSUCTION TO BILATERAL BREASTS;  Surgeon: Wallace Going, DO;  Location: [place];  Service: Plastics;  Laterality: Bilateral;   LIPOSUCTION WITH LIPOFILLING Bilateral 04/22/2019   Procedure: fat grafting/liposuction and lipofilling bilateral breasts;  Surgeon: Wallace Going, DO;  Location: [place];  Service: Plastics;  Laterality: Bilateral;   MASS EXCISION N/A 05/20/2016   Procedure: EXCISION CHEST WALL MASS;  Surgeon: Florene Glen, MD;  Location: [facility];  Service: General;  Laterality: N/A;   RECONSTRUCTION BREAST W/ LATISSIMUS DORSI FLAP Right 06/01/2018   REMOVAL OF BILATERAL TISSUE EXPANDERS WITH PLACEMENT OF BILATERAL BREAST IMPLANTS Bilateral 10/15/2018   Procedure: REMOVAL OF BILATERAL TISSUE EXPANDERS WITH PLACEMENT OF BILATERAL BREAST IMPLANTS;  Surgeon: Wallace Going, DO;  Location: [place];  Service: Plastics;  Laterality: Bilateral;   SCAR REVISION Bilateral 04/22/2019   Procedure: Excision of bilateral scar contracture;  Surgeon: Wallace Going, DO;  Location: [place];  Service: Plastics;  Laterality: Bilateral;  total case 90 min   SENTINEL  NODE BIOPSY Right 06/15/2015   Procedure: SENTINEL NODE BIOPSY;  Surgeon: Hubbard Robinson, MD;  Location: [facility];  Service: General;  Laterality: Right;   TOTAL MASTECTOMY Right 05/22/2017   Procedure: TOTAL MASTECTOMY;  Surgeon: Florene Glen, MD;  Location: [facility];  Service: General;  Laterality: Right;   TOTAL MASTECTOMY Left 02/09/2018   Procedure: LEFT PROPHYLACTIC   MASTECTOMY;  Surgeon: Jovita Kussmaul, MD;  Location: [facility];  Service: General;  Laterality: Left;   TUBAL LIGATION  2004   ULNAR NERVE TRANSPOSITION Left 01/29/2019   Procedure: LEFT ULNAR NERVE DECOMPRESSION/TRANSPOSITION;  Surgeon: Leanora Cover, MD;  Location: [facility];  Service: Orthopedics;  Laterality: Left;   Patient Active Problem List   Diagnosis Date Noted   Genetic testing 11/02/2021   Elevated blood pressure reading in office with white coat syndrome, with diagnosis of hypertension 08/08/2021   Limited joint range of motion (ROM) 07/19/2021   Class 3 severe obesity due to excess calories with body mass index (BMI) of 45.0 to 49.9 in adult ([place]) 06/25/2021   Prediabetes 06/21/2021   Elevated ferritin level 06/21/2021   OSA on CPAP 04/20/2021   Migraine headache without aura 07/21/2020   Ulnar neuropathy 08/28/2018   S/P breast reconstruction, bilateral 06/16/2018   Acquired absence of right breast 06/01/2018   S/P mastectomy, bilateral 02/17/2018   Acquired absence of left breast 02/09/2018   Acquired absence of breast and absent nipple, right 01/02/2018   Postoperative breast asymmetry 01/02/2018   Intractable nausea and vomiting 08/11/2017   Breast cancer ([place]) 05/22/2017   Morbid (severe) obesity due to excess calories ([place]) 04/15/2017   Microcytic anemia 04/15/2017   GERD (gastroesophageal reflux disease) 04/15/2017   Chest wall mass    Malignant neoplasm of upper-outer quadrant of right female breast ([place]) 05/25/2015   HTN (hypertension) 05/24/2015      REFERRING DIAG: M70.62 (ICD-10-CM) - Trochanteric bursitis of left hip   THERAPY DIAG:  Pain in right hip   Pain in left hip   Difficulty in walking, not elsewhere classified   Muscle weakness (generalized)   Rationale for Evaluation and Treatment Rehabilitation   PERTINENT HISTORY: Patient is a 51 year old female with complaint of bilateral hip pain, more pain on R at this time due to  Hx of injection in L hip. Pt had greater trochanteric injection on her L side on 09/24/21 - this has helped with tolerance of walking. Patient reports moderate referral to her R lateral thigh proximally. Pt denies paresthesias or numbness in lower body. Pt reports pain along posterolateral gluteal region to greater trochanter R>L side. Pt reports that symptoms began about 4-6 months ago and has progressively worsened. Pt reports fall last year without Fx or major injury ("lot of soreness"). Pt reports intermittent disturbed sleep with turning on her side. Patient reports no bowel/bladder changes. Patient reports hx of sciatica when she was pregnant, though not in recent years. Pt reports numbness with prolonged sitting LLE. Hx of varicose veins.      PRECAUTIONS: None   OBJECTIVE: (objective measures completed at initial evaluation unless otherwise dated)     DIAGNOSTIC FINDINGS:  CT Scan Chest, abomen, pelvis: negative for metastatic disease, mild degenerative change of bilateral hips.   DXA Bone density scan: T-score of 0.7     PATIENT SURVEYS:  FOTO 47 , predicted goal score of 57   COGNITION:           Overall cognitive status: Within functional limits  for tasks assessed                          SENSATION: WFL   POSTURE: Overpronation bilat feet, genu valgum   PALPATION: Tenderness to palpation along R>L greater trochanter, gluteus medius 10/23/21: Tenderness to palpation along Right patellar tendon, LCL, MCL, lateral>medial joint line, medial/lateral hamstrings; Left medial joint line, MCL, medial/lateral hamstrings.     MUSCLE LENGTH (10/23/21) Hamstring 90/90: R WFL, L WFL Ely's Test: R Positive (90 deg knee flexion in prone), L Positive (90 deg knee flexion in prone) Gastrocnemius: R Poor (DF to neutral with knee extended), L Poor (DF to neutral with knee extended)   LOWER EXTREMITY ROM:   Lumbar spine AROM WNL with exception of significant trunk rotation motion loss R and L;  back pain with extension, hip pain with trunk rotation in either direction                                Knee/ankle updated 10/23/21 Active ROM Right eval Left eval Right 11/14/21 Left 11/14/21  Hip flexion 95 95    Hip extension        Hip abduction 20* 40*    Hip adduction        Hip internal rotation 28 30    Hip external rotation 25 22    Knee flexion  100 deg  101 deg    Knee extension  -2 -2     Ankle dorsiflexion To neutral  To neutral     Ankle plantarflexion        Ankle inversion        Ankle eversion         (Blank rows = not tested) Knee and ankle values taken 10/23/21   *Pain with flexion overpressure bilaterally, pain with extension overpressure on L only     LOWER EXTREMITY MMT:   MMT Right eval Left eval Right 11/14/21 Left 11/14/21  Hip flexion 5 5 5 5   Hip extension        Hip abduction 4-* 4 4+ 4-*  Hip adduction 5 5 5 5   Hip internal rotation 4+ 5 4+ 5  Hip external rotation 4+ 4+ 5 4+  Knee flexion 5 4+ (updated 10/23/21) 5 4+  Knee extension 5 5 4+ 5  Ankle dorsiflexion        Ankle plantarflexion        Ankle inversion        Ankle eversion         (Blank rows = not tested)   LOWER EXTREMITY SPECIAL TESTS:  Hip special tests: Saralyn Pilar (FABER) test: positive  and Hip scouring test: positive , Straight leg raise: Negative bilaterally   10/23/21: McMurray's test: Positive bilaterally      GAIT: Comments: mild compensated Trendelenburg R>L        TODAY'S TREATMENT:   SUBJECTIVE: Patient reports her R side seems to be doing better. She feels that her L side has minimally improved - "a work in progress." Patient reports pain in L knee with prolonged sitting. Patient reports doing well with her home program. Patient reports      PAIN:  Are you having pain? Yes, 4/10 pain, affecting R lateral knee primarily         Manual Therapy - for symptom modulation, soft tissue sensitivity and mobility, joint mobility, ROM  Bilateral long-leg  distraction with Mulligan belt, intermittent; 10 sec on, 5 sec off; x 8 minutes                                 STM IASTM with Theraband roller along L lateral gastrocemius and peroneal mm; x 4 minutes               IASTM with Hypervolt along L gluteus medius; x 8 minutes                            *not today* Manual quadriceps stretch in prone; x 60 sec bilaterally              Mulligan belt lateral distraction in hooklying and mobilization with movement with conjunct hip ER, IR, and flexion; x 3 minutes bilaterally              Bilateral STM and IASTM with Hypervolt along gluteus medius and minimus; x 3 minutes on each side         Therapeutic Exercise - for improved soft tissue flexibility and extensibility as needed for ROM, improved strength as needed to improve performance of CKC activities/functional movements; progressive loading of affected tissues to promote tissue remodeling and healing   NuStep; 5 minutes, Level 2, seat at 11, arms at 9            Supine piriformis stretch; 2x30 sec, bilat     PATIENT EDUCATION: Dicussed positioning strategies for comfort during sleeping at night and reviewed products available to improve comfort. Discussed activity modification given recent flare-up of symptoms at end of last week.      *next visit*   Sidelying clamshell; 2x10, bilateral   Minisquat; 2x8, hands hovering along bilateral handrails, PT guarding posteriorly for pt safety     *hold today* Lateral step up; 1x10 on each LE, 6-inch step, staircase in center of gym               *not today* Supine glute bridge; 2x10, with Blue Tband looped around BLE superior to knees Standing gastrocemius stretch, lunge at wall; 2x30 sec bilateral Prone Quad stretch; 2x30 sec, bilat Hip abductor and adductor isometrics; x10, 10 sec each direction Supine gastrocnemius stretch     PATIENT EDUCATION:  Education details:  see above for patient education details   Person educated:  Patient Education method: Explanation, Demonstration, and Handouts Education comprehension: verbalized understanding and returned demonstration     HOME EXERCISE PROGRAM: Access Code [email or web address]   ASSESSMENT:   CLINICAL IMPRESSION: Patient reports doing well this past Thursday, but she had notable pain after performing ample activity on Friday with pt walking around her house for several hours cleaning and organizing. Pt reports debilitating pain between Friday to Saturday morning which limited her ability to walk - she had to use walker to go to bathroom. This has fortunately improved since then, but pt will need to decrease volume of load-bearing activity to prevent future flare-ups. Discussed decreasing total volume of this type of work and completing tasks with intermittent breaks versus completing without pausing. Pt does well with manual intervention today. Will resume more closed-chain drills next visit pending continued progress c patient's condition. Patient has remaining deficits in bilateral hip ROM, hip abductor strength, gait changes, local nociceptive lateral hip pain. Patient will benefit from continued skilled therapeutic intervention to address the above  deficits as needed for improved function and QoL.      OBJECTIVE IMPAIRMENTS Abnormal gait, difficulty walking, decreased ROM, decreased strength, hypomobility, and pain.    ACTIVITY LIMITATIONS sitting, standing, squatting, sleeping, stairs, transfers, bed mobility, and locomotion level   PARTICIPATION LIMITATIONS: shopping and community activity   PERSONAL FACTORS Time since onset of injury/illness/exacerbation and 3+ comorbidities: (obesity, HTN, depression, Hx breast cancer, neuropathy)  are also affecting patient's functional outcome.    REHAB POTENTIAL: Good   CLINICAL DECISION MAKING: Evolving/moderate complexity   EVALUATION COMPLEXITY: Moderate     GOALS:   SHORT TERM GOALS: Target date: 11/07/2021  Patient will  be independent and compliant with HEP as needed for symptom modulation, mobility, and strength gains to augment in-clinic PT intervention and improve pt function  Baseline: 10/16/21: Baseline HEP initiated.  11/14/21: Excellent HEP compliance and pt verbalizes her exercises well.  Goal status: ACHIEVED     LONG TERM GOALS: Target date: 11/29/2021    Patient will demonstrate improved function as evidenced by a score of 57 on FOTO measure for full participation in activities at home and in the community. Baseline: 10/16/21 47.    11/14/21: 59/57.  Goal status: INITIAL   2.  Patient will report NPRS no greater than 2-3/10 with daily activities as needed for improved ability to perform self-care, access community, and participate in desired life roles  Baseline: 10/16/21: NPRS 6-7/10 at worst.   11/14/21: 5/10 at worst.  Goal status: INITIAL   3.  Patient will have MMT 5/5 for all tested hip motions indicative of improved strength as needed for improved tolerance to loading affected tissues during prolonged weightbearing and closed-chain work Baseline: 10/16/21: MMT R/L Hip abduction 4-/4, Hip IR 4+/5, Hip ER 4+/4+ Goal status: INITIAL   4.  Patient will sleep through the night without disturbed sleep secondary to hip pain on either side as needed for improved sleep quality and long-term wellness Baseline: 10/16/21: Intermittent disturbed sleep when rolling onto R more so than L side.    11/14/21: about one wake-up during the night due to pain.  Goal status: IN PROGRESS   5.  Patient will complete errands in town including ambulating around grocery store/shopping center without increase in pain > 2/10 indicative of community-level mobility without increase in hip symptoms Baseline: 10/16/21: Notable pain with walking, pain worsens with higher volume of ambulatory activity.  11/14/21: Notable pain in knees with this, not as bad with hips  Goal status: PARTIALLY MET        PLAN: PT FREQUENCY: 2x/week    PT DURATION: 6 weeks   PLANNED INTERVENTIONS: Therapeutic exercises, Therapeutic activity, Neuromuscular re-education, Balance training, Gait training, Patient/Family education, Self Care, Joint mobilization, Dry Needling, Electrical stimulation, Cryotherapy, Moist heat, and Manual therapy   PLAN FOR NEXT SESSION: Manual therapy for hip mobility and distraction techniques to modulate hip pain, continued stretching and icing for patient's HEP, gradually progress loading of posterolateral hip musculature. Progress with hip and hamstring strengthening and knee mobility as able, continue with progressive closed-chain strengthening.  Recommend continued PT 2x/week for 4 weeks       Eilleen Kempf, PT 11/14/2021, 3:05 PM

## 2021-11-15 ENCOUNTER — Encounter: Payer: Self-pay | Admitting: Physical Therapy

## 2021-11-15 ENCOUNTER — Ambulatory Visit: Payer: Medicare Other | Admitting: Physical Therapy

## 2021-11-19 ENCOUNTER — Emergency Department (HOSPITAL_COMMUNITY)
Admission: EM | Admit: 2021-11-19 | Discharge: 2021-11-20 | Disposition: A | Discharge status: Home / Self Care | Payer: Medicare Other | Attending: Emergency Medicine | Admitting: Emergency Medicine

## 2021-11-19 ENCOUNTER — Encounter (HOSPITAL_COMMUNITY): Payer: Self-pay

## 2021-11-19 ENCOUNTER — Emergency Department (HOSPITAL_BASED_OUTPATIENT_CLINIC_OR_DEPARTMENT_OTHER): Admit: 2021-11-19 | Discharge: 2021-11-19 | Disposition: A | Discharge status: Home / Self Care | Payer: Medicare Other

## 2021-11-19 ENCOUNTER — Emergency Department (HOSPITAL_COMMUNITY): Payer: Medicare Other

## 2021-11-19 ENCOUNTER — Ambulatory Visit: Payer: Medicare Other | Admitting: Physical Therapy

## 2021-11-19 ENCOUNTER — Other Ambulatory Visit: Payer: Self-pay

## 2021-11-19 DIAGNOSIS — Z79899 Other long term (current) drug therapy: Secondary | ICD-10-CM | POA: Diagnosis not present

## 2021-11-19 DIAGNOSIS — R609 Edema, unspecified: Secondary | ICD-10-CM

## 2021-11-19 DIAGNOSIS — I10 Essential (primary) hypertension: Secondary | ICD-10-CM | POA: Insufficient documentation

## 2021-11-19 DIAGNOSIS — M79609 Pain in unspecified limb: Secondary | ICD-10-CM | POA: Diagnosis not present

## 2021-11-19 DIAGNOSIS — M79605 Pain in left leg: Secondary | ICD-10-CM | POA: Diagnosis not present

## 2021-11-19 DIAGNOSIS — M79662 Pain in left lower leg: Secondary | ICD-10-CM

## 2021-11-19 DIAGNOSIS — Z853 Personal history of malignant neoplasm of breast: Secondary | ICD-10-CM | POA: Diagnosis not present

## 2021-11-19 LAB — CBC
HCT: 37.6 % (ref 36.0–46.0)
Hemoglobin: 11.6 g/dL — ABNORMAL LOW (ref 12.0–15.0)
MCH: 25.9 pg — ABNORMAL LOW (ref 26.0–34.0)
MCHC: 30.9 g/dL (ref 30.0–36.0)
MCV: 83.9 fL (ref 80.0–100.0)
Platelets: 220 10*3/uL (ref 150–400)
RBC: 4.48 MIL/uL (ref 3.87–5.11)
RDW: 17.2 % — ABNORMAL HIGH (ref 11.5–15.5)
WBC: 6.3 10*3/uL (ref 4.0–10.5)
nRBC: 0 % (ref 0.0–0.2)

## 2021-11-19 LAB — D-DIMER, QUANTITATIVE: D-Dimer, Quant: 0.43 ug/mL-FEU (ref 0.00–0.50)

## 2021-11-19 LAB — BASIC METABOLIC PANEL
Anion gap: 6 (ref 5–15)
BUN: 24 mg/dL — ABNORMAL HIGH (ref 6–20)
CO2: 32 mmol/L (ref 22–32)
Calcium: 9.6 mg/dL (ref 8.9–10.3)
Chloride: 102 mmol/L (ref 98–111)
Creatinine, Ser: 0.92 mg/dL (ref 0.44–1.00)
GFR, Estimated: >60 mL/min (ref >60)
Glucose, Bld: 98 mg/dL (ref 70–99)
Potassium: 3.7 mmol/L (ref 3.5–5.1)
Sodium: 140 mmol/L (ref 135–145)

## 2021-11-19 NOTE — ED Triage Notes (Signed)
Patient complains of left lower leg pain x 3 weeks b ut worse over the past couple days.

## 2021-11-19 NOTE — ED Provider Triage Note (Signed)
Emergency Medicine Provider Triage Evaluation Note  Michelle Mcmillan , a 51 y.o. female  was evaluated in triage.  Pt complains of left leg swelling, pain.  She endorses no history of DVT, PE.  She does have history of arthritis of the left knee.  She reports that she has been doing physical therapy on that side.  She reports that she was having some chest tightness, shortness of breath without chest pain, hemoptysis prior to the leg swelling starting.  She reports minimal improvement with ibuprofen, Voltaren.  Review of Systems  Positive: Leg swelling, chest tightness, shortness of breath prior to leg swelling Negative: Current shortness of breath, chest pain  Physical Exam  There were no vitals taken for this visit. Gen:   Awake, no distress   Resp:  Normal effort  MSK:   Moves extremities without difficulty  Other:  Tenderness to palpation left calf, left tibia/fibula  Medical Decision Making  Medically screening exam initiated at 4:28 PM.  Appropriate orders placed.  Michelle Mcmillan was informed that the remainder of the evaluation will be completed by another provider, this initial triage assessment does not replace that evaluation, and the importance of remaining in the ED until their evaluation is complete.  Work-up initiated   Anselmo Pickler, [place] 11/19/21 1635

## 2021-11-19 NOTE — Progress Notes (Signed)
Left lower extremity venous duplex completed. Refer to "CV Proc" under chart review to view preliminary results.  11/19/2021 5:09 PM Kelby Aline., MHA, RVT, RDCS, RDMS

## 2021-11-20 MED ORDER — HYDROCODONE-ACETAMINOPHEN 5-325 MG PO TABS: 1.0000 | ORAL_TABLET | Freq: Four times a day (QID) | ORAL | 0 refills | Status: DC | PRN

## 2021-11-20 NOTE — ED Provider Notes (Signed)
[facility] EMERGENCY DEPARTMENT Provider Note   CSN: [id number] Arrival date & time: 11/19/21  1619     History  Chief Complaint  Patient presents with   Leg Pain    Michelle Mcmillan is a 51 y.o. female.   Leg Pain Associated symptoms: no fever    Patient reports left leg pain for up to 3 weeks.  No traumatic injury or fall, but may have injured it during physical therapy.  No known history of VTE.  She reports it seems to worsen after she is sedentary for a while.  It also worsens at nighttime while sleeping. No active chest pain or shortness of breath recently (mentioned to PA Prosperi that she had shortness of breath previously)   Past Medical History:  Diagnosis Date   Anemia    H/O   Anemia    Arthritis    RIGHT KNEE   Back pain    Breast cancer ([place]) 2017   right breast   Cancer ([place])    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)   Carpal tunnel syndrome    Constipation    Depression    Dyspnea    GERD (gastroesophageal reflux disease)    NO MEDS   Headache    migraines   Hypertension    Joint pain    Last menstrual period (LMP) > 10 days ago 08/2015   Lower extremity edema    Migraines    Neuropathy    OSA (obstructive sleep apnea)    Personal history of radiation therapy    Prediabetes    SOB (shortness of breath)     Home Medications Prior to Admission medications   Medication Sig Start Date End Date Taking? Authorizing Provider  HYDROcodone-acetaminophen (NORCO/VICODIN) 5-325 MG tablet Take 1 tablet by mouth every 6 (six) hours as needed for severe pain. 11/20/21  Yes Ripley Fraise, MD  APPLE CIDER VINEGAR PO Take by mouth. Two tabs daily    [provider]  buPROPion (WELLBUTRIN XL) 150 MG 24 hr tablet Take 150 mg by mouth daily.    [provider]  butalbital-aspirin-caffeine Acquanetta Chain) 50-325-40 MG capsule Take 1 capsule by mouth as needed for headache.    [provider]  calcium-vitamin D (OSCAL WITH D) 500-200  MG-UNIT TABS tablet Take 1 tablet by mouth 2 (two) times daily. With meals    [provider]  diclofenac sodium (VOLTAREN) 1 % GEL Apply 4 g topically 4 (four) times daily.    [provider]  dorzolamide-timolol (COSOPT) 22.3-6.8 MG/ML ophthalmic solution Place 1 drop into both eyes every morning. 12/17/19   [provider]  ELDERBERRY PO Take 50 mg by mouth daily.    [provider]  Ferrous Sulfate (IRON) 325 (65 Fe) MG TABS Take 1 tablet (325 mg total) by mouth 2 (two) times daily. 06/25/17   Lloyd Huger, MD  gabapentin (NEURONTIN) 300 MG capsule TAKE 1 CAPSULE BY MOUTH 3  TIMES DAILY AND MAY TAKE 2  CAPSULES BY MOUTH AT  BEDTIME IF MORE BOTHERSOME  AT NIGHT 10/16/20   Lloyd Huger, MD  hydrochlorothiazide (MICROZIDE) 12.5 MG capsule Take 1 capsule (12.5 mg total) by mouth daily. 09/04/21   Jearld Lesch A, DO  ibuprofen (ADVIL) 800 MG tablet TAKE 1 TABLET BY MOUTH EVERY 8 HOURS WITH FOOD AS NEEDED FOR PAIN 11/14/18   [provider]  latanoprost (XALATAN) 0.005 % ophthalmic solution Place 1 drop into both eyes at bedtime. 12/17/19   [provider]  letrozole (FEMARA) 2.5 MG tablet TAKE 1 TABLET BY MOUTH  DAILY 10/30/21   Lloyd Huger, MD  Melatonin 5 MG CAPS Take 5 mg by mouth at bedtime.    [provider]  omeprazole (PRILOSEC) 20 MG capsule Take 1 capsule (20 mg total) by mouth daily. 04/16/17   Lin Landsman, MD  rizatriptan (MAXALT) 10 MG tablet SMARTSIG:1 Tablet(s) By Mouth 1 to 2 Times Daily 11/01/19   [provider]      Allergies    Nitroglycerin    Review of Systems   Review of Systems  Constitutional:  Negative for fever.  Musculoskeletal:  Positive for arthralgias.    Physical Exam Updated Vital Signs BP (!) 140/94 (BP Location: Left Arm)   Pulse (!) 57   Temp 97.7 F (36.5 C)   Resp 16   SpO2 100%  Physical Exam CONSTITUTIONAL: Well developed/well nourished HEAD:  Normocephalic/atraumatic EYES: EOMI ENMT: Mucous membranes moist NECK: supple no meningeal signs CV: S1/S2 noted, no murmurs/rubs/gallops noted LUNGS: Lungs are clear to auscultation bilaterally, no apparent distress ABDOMEN: soft NEURO: Pt is awake/alert/appropriate, moves all extremitiesx4.  No facial droop.   EXTREMITIES: pulses normal/equal, full ROM Distal pulses are equal in both legs.  No erythema or crepitus is noted to either leg.  The legs are symmetric.  She has tenderness to the lateral aspect of the left calf.  No skin changes were noted.  She has full range of motion of both knees and both ankles. SKIN: warm, color normal PSYCH: no abnormalities of mood noted, alert and oriented to situation  ED Results / Procedures / Treatments   Labs (all labs ordered are listed, but only abnormal results are displayed) Labs Reviewed  CBC - Abnormal; Notable for the following components:      Result Value   Hemoglobin 11.6 (*)    MCH 25.9 (*)    RDW 17.2 (*)    All other components within normal limits  BASIC METABOLIC PANEL - Abnormal; Notable for the following components:   BUN 24 (*)    All other components within normal limits  D-DIMER, QUANTITATIVE    EKG None  Radiology DG Chest 2 View  Result Date: 11/19/2021 CLINICAL DATA:  Leg pain EXAM: CHEST - 2 VIEW COMPARISON:  09/14/2021 FINDINGS: The heart size and mediastinal contours are within normal limits. Both lungs are clear. The visualized skeletal structures are unremarkable. IMPRESSION: No active cardiopulmonary disease. Electronically Signed   By: Davina Poke D.O.   On: 11/19/2021 17:28   DG Tibia/Fibula Left  Result Date: 11/19/2021 CLINICAL DATA:  Left leg pain and swelling. EXAM: LEFT TIBIA AND FIBULA - 2 VIEW COMPARISON:  Knee radiographs 10/22/2021 FINDINGS: The bones appear mildly demineralized. There is no evidence of acute fracture or dislocation. Moderately advanced tricompartmental degenerative changes  are again noted at the left knee. There are lesser degenerative changes at the ankle. No foreign body or focal soft tissue abnormalities are identified. The knee joint is incompletely visualized. IMPRESSION: No acute osseous findings. Degenerative changes at the left knee and ankle without apparent focal soft tissue abnormality. Electronically Signed   By: Richardean Sale M.D.   On: 11/19/2021 17:28   VAS [place] LOWER EXTREMITY VENOUS (DVT) (7a-7p)  Result Date: 11/19/2021  Lower Venous DVT Study Patient Name:  Michelle Mcmillan  Date of Exam:   11/19/2021 Medical Rec #: [id number]       Accession #:    [id number] Date of  Birth: Nov 20, 1970       Patient Gender: F Patient Age:   80 years Exam Location:  [facility] Procedure:      VAS [place] LOWER EXTREMITY VENOUS (DVT) Referring Phys: CHRISTIAN PROSPERI --------------------------------------------------------------------------------  Indications: Edema, and Pain.  Limitations: Body habitus and poor ultrasound/tissue interface. Comparison Study: No prior study Performing Technologist: Maudry Mayhew MHA, RDMS, RVT, RDCS  Examination Guidelines: A complete evaluation includes B-mode imaging, spectral Doppler, color Doppler, and power Doppler as needed of all accessible portions of each vessel. Bilateral testing is considered an integral part of a complete examination. Limited examinations for reoccurring indications may be performed as noted. The reflux portion of the exam is performed with the patient in reverse Trendelenburg.  +-----+---------------+---------+-----------+----------+--------------+ RIGHTCompressibilityPhasicitySpontaneityPropertiesThrombus Aging +-----+---------------+---------+-----------+----------+--------------+ CFV  Full           Yes      Yes                                 +-----+---------------+---------+-----------+----------+--------------+   +---------+---------------+---------+-----------+----------+--------------+ LEFT      CompressibilityPhasicitySpontaneityPropertiesThrombus Aging +---------+---------------+---------+-----------+----------+--------------+ CFV      Full           Yes      Yes                                 +---------+---------------+---------+-----------+----------+--------------+ SFJ      Full                                                        +---------+---------------+---------+-----------+----------+--------------+ FV Prox  Full                                                        +---------+---------------+---------+-----------+----------+--------------+ FV Mid   Full                                                        +---------+---------------+---------+-----------+----------+--------------+ FV DistalFull                                                        +---------+---------------+---------+-----------+----------+--------------+ PFV      Full                                                        +---------+---------------+---------+-----------+----------+--------------+ POP      Full           Yes      Yes                                 +---------+---------------+---------+-----------+----------+--------------+  PTV      Full                                                        +---------+---------------+---------+-----------+----------+--------------+ PERO     Full                                                        +---------+---------------+---------+-----------+----------+--------------+     Summary: RIGHT: - No evidence of common femoral vein obstruction.  LEFT: - There is no evidence of deep vein thrombosis in the lower extremity.  - No cystic structure found in the popliteal fossa.  *See table(s) above for measurements and observations.    Preliminary     Procedures Procedures    Medications Ordered in ED Medications - No data to display  ED Course/ Medical Decision Making/ A&P Clinical Course as of  11/20/21 0008  Tue Nov 20, 2021  0008 Patient overall well-appearing.  Work-up in the emerged part has been unremarkable.  She adamantly denies any chest pain at this time.  She already has PT follow-up this week.  She suspects it may have started in PT several weeks ago.  She is safe for discharge [DW]    Clinical Course User Index [DW] Ripley Fraise, MD                           Medical Decision Making Risk Prescription drug management.   This patient presents to the ED for concern of leg pain, this involves an extensive number of treatment options, and is a complaint that carries with it a high risk of complications and morbidity.  The differential diagnosis includes but is not limited to DVT, cellulitis, abscess, muscle strain, necrotizing fasciitis  Comorbidities that complicate the patient evaluation: Patient's presentation is complicated by their history of obesity, breast cancer   Additional history obtained: Additional history obtained from spouse Lab Tests: I Ordered, and personally interpreted labs.  The pertinent results include: Overall unremarkable  Imaging Studies ordered: I ordered imaging studies including X-ray left tibia   I independently visualized and interpreted imaging which showed acute findings I agree with the radiologist interpretation  DVT study negative   Complexity of problems addressed: Patient's presentation is most consistent with  acute complicated illness/injury requiring diagnostic workup  Disposition: After consideration of the diagnostic results and the patient's response to treatment,  I feel that the patent would benefit from discharge   .           Final Clinical Impression(s) / ED Diagnoses Final diagnoses:  Pain of left calf    Rx / DC Orders ED Discharge Orders          Ordered    HYDROcodone-acetaminophen (NORCO/VICODIN) 5-325 MG tablet  Every 6 hours PRN        11/20/21 0003              Ripley Fraise, MD 11/20/21 0012

## 2021-11-20 NOTE — Discharge Instructions (Signed)
Keep your leg elevated and limit your weightbearing the next several days.  Also continue to use ibuprofen and pain medicine provided for severe pain.  You  may need further evaluation by a physical therapist.  Follow-up with the orthopedist if no improvement in a week

## 2021-11-21 ENCOUNTER — Ambulatory Visit: Payer: Medicare Other | Admitting: Physical Therapy

## 2021-11-22 ENCOUNTER — Encounter: Payer: Medicare Other | Admitting: Physical Therapy

## 2021-11-26 ENCOUNTER — Encounter: Payer: Self-pay | Admitting: Physical Therapy

## 2021-11-26 ENCOUNTER — Ambulatory Visit: Payer: Medicare Other | Admitting: Physical Therapy

## 2021-11-26 DIAGNOSIS — M25551 Pain in right hip: Secondary | ICD-10-CM | POA: Diagnosis not present

## 2021-11-26 DIAGNOSIS — M6281 Muscle weakness (generalized): Secondary | ICD-10-CM

## 2021-11-26 DIAGNOSIS — R262 Difficulty in walking, not elsewhere classified: Secondary | ICD-10-CM

## 2021-11-26 DIAGNOSIS — M25552 Pain in left hip: Secondary | ICD-10-CM

## 2021-11-26 NOTE — Therapy (Signed)
OUTPATIENT PHYSICAL THERAPY TREATMENT NOTE   Patient Name: Michelle Mcmillan MRN: [id number] DOB:[birth year], 51 y.o., female Today's Date: 11/26/2021    END OF SESSION:   PT End of Session - 11/26/21 1111     Visit Number 11    Number of Visits 18    Date for PT Re-Evaluation 12/20/21    Authorization Type UHC Medicare/Medicaid, VL based on medical necessity    Progress Note Due on Visit --    PT Start Time 0931    PT Stop Time 1017    PT Time Calculation (min) 46 min    Activity Tolerance Patient tolerated treatment well    Behavior During Therapy [facility] for tasks assessed/performed             Past Medical History:  Diagnosis Date   Anemia    H/O   Anemia    Arthritis    RIGHT KNEE   Back pain    Breast cancer ([place]) 2017   right breast   Cancer ([place])    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)   Carpal tunnel syndrome    Constipation    Depression    Dyspnea    GERD (gastroesophageal reflux disease)    NO MEDS   Headache    migraines   Hypertension    Joint pain    Last menstrual period (LMP) > 10 days ago 08/2015   Lower extremity edema    Migraines    Neuropathy    OSA (obstructive sleep apnea)    Personal history of radiation therapy    Prediabetes    SOB (shortness of breath)    Past Surgical History:  Procedure Laterality Date   BREAST BIOPSY Right 05/08/2017   invasive mamm. ca   BREAST EXCISIONAL BIOPSY Right 06/15/15   breast cancer   BREAST LUMPECTOMY WITH NEEDLE LOCALIZATION Right 06/15/2015   Procedure: BREAST LUMPECTOMY WITH NEEDLE LOCALIZATION;  Surgeon: Hubbard Robinson, MD;  Location: [facility];  Service: General;  Laterality: Right;   BREAST RECONSTRUCTION WITH PLACEMENT OF TISSUE EXPANDER AND FLEX HD (ACELLULAR HYDRATED DERMIS) Left 02/09/2018   Procedure: BREAST RECONSTRUCTION WITH PLACEMENT OF TISSUE EXPANDER AND FLEX HD (ACELLULAR HYDRATED DERMIS);  Surgeon: Wallace Going, DO;  Location: [facility];  Service: Plastics;  Laterality: Left;    BREAST SURGERY     CESAREAN SECTION  1988   x1   COLONOSCOPY WITH PROPOFOL N/A 01/23/2017   Procedure: COLONOSCOPY WITH PROPOFOL;  Surgeon: Lin Landsman, MD;  Location: [facility] ENDOSCOPY;  Service: Gastroenterology;  Laterality: N/A;   ESOPHAGOGASTRODUODENOSCOPY (EGD) WITH PROPOFOL N/A 01/23/2017   Procedure: ESOPHAGOGASTRODUODENOSCOPY (EGD) WITH PROPOFOL;  Surgeon: Lin Landsman, MD;  Location: [facility] ENDOSCOPY;  Service: Gastroenterology;  Laterality: N/A;   LATISSIMUS FLAP TO BREAST Right 06/01/2018   Procedure: RIGHT BREAST RECONSTRUCTION WITH LATISSIMUS MYOCUTANEOUS FLAP;  Surgeon: Wallace Going, DO;  Location: [place];  Service: Plastics;  Laterality: Right;   LIPOSUCTION WITH LIPOFILLING Bilateral 10/15/2018   Procedure: LIPOSUCTION TO BILATERAL BREASTS;  Surgeon: Wallace Going, DO;  Location: [place];  Service: Plastics;  Laterality: Bilateral;   LIPOSUCTION WITH LIPOFILLING Bilateral 04/22/2019   Procedure: fat grafting/liposuction and lipofilling bilateral breasts;  Surgeon: Wallace Going, DO;  Location: [place];  Service: Plastics;  Laterality: Bilateral;   MASS EXCISION N/A 05/20/2016   Procedure: EXCISION CHEST WALL MASS;  Surgeon: Florene Glen, MD;  Location: [facility];  Service: General;  Laterality: N/A;   RECONSTRUCTION BREAST  W/ LATISSIMUS DORSI FLAP Right 06/01/2018   REMOVAL OF BILATERAL TISSUE EXPANDERS WITH PLACEMENT OF BILATERAL BREAST IMPLANTS Bilateral 10/15/2018   Procedure: REMOVAL OF BILATERAL TISSUE EXPANDERS WITH PLACEMENT OF BILATERAL BREAST IMPLANTS;  Surgeon: Wallace Going, DO;  Location: [place];  Service: Plastics;  Laterality: Bilateral;   SCAR REVISION Bilateral 04/22/2019   Procedure: Excision of bilateral scar contracture;  Surgeon: Wallace Going, DO;  Location: [place];  Service: Plastics;  Laterality: Bilateral;  total case 90 min   SENTINEL NODE  BIOPSY Right 06/15/2015   Procedure: SENTINEL NODE BIOPSY;  Surgeon: Hubbard Robinson, MD;  Location: [facility];  Service: General;  Laterality: Right;   TOTAL MASTECTOMY Right 05/22/2017   Procedure: TOTAL MASTECTOMY;  Surgeon: Florene Glen, MD;  Location: [facility];  Service: General;  Laterality: Right;   TOTAL MASTECTOMY Left 02/09/2018   Procedure: LEFT PROPHYLACTIC  MASTECTOMY;  Surgeon: Jovita Kussmaul, MD;  Location: [facility];  Service: General;  Laterality: Left;   TUBAL LIGATION  2004   ULNAR NERVE TRANSPOSITION Left 01/29/2019   Procedure: LEFT ULNAR NERVE DECOMPRESSION/TRANSPOSITION;  Surgeon: Leanora Cover, MD;  Location: [place];  Service: Orthopedics;  Laterality: Left;   Patient Active Problem List   Diagnosis Date Noted   Genetic testing 11/02/2021   Elevated blood pressure reading in office with white coat syndrome, with diagnosis of hypertension 08/08/2021   Limited joint range of motion (ROM) 07/19/2021   Class 3 severe obesity due to excess calories with body mass index (BMI) of 45.0 to 49.9 in adult ([place]) 06/25/2021   Prediabetes 06/21/2021   Elevated ferritin level 06/21/2021   OSA on CPAP 04/20/2021   Migraine headache without aura 07/21/2020   Ulnar neuropathy 08/28/2018   S/P breast reconstruction, bilateral 06/16/2018   Acquired absence of right breast 06/01/2018   S/P mastectomy, bilateral 02/17/2018   Acquired absence of left breast 02/09/2018   Acquired absence of breast and absent nipple, right 01/02/2018   Postoperative breast asymmetry 01/02/2018   Intractable nausea and vomiting 08/11/2017   Breast cancer ([place]) 05/22/2017   Morbid (severe) obesity due to excess calories ([place]) 04/15/2017   Microcytic anemia 04/15/2017   GERD (gastroesophageal reflux disease) 04/15/2017   Chest wall mass    Malignant neoplasm of upper-outer quadrant of right female breast ([place]) 05/25/2015   HTN (hypertension) 05/24/2015    PCP: Donnie Coffin,  MD REFERRING PROVIDER: Esaw Grandchild, NP   REFERRING DIAG: M70.62 (ICD-10-CM) - Trochanteric bursitis of left hip   THERAPY DIAG:  Pain in right hip   Pain in left hip   Difficulty in walking, not elsewhere classified   Muscle weakness (generalized)   Rationale for Evaluation and Treatment Rehabilitation   PERTINENT HISTORY: Patient is a 51 year old female with complaint of bilateral hip pain, more pain on R at this time due to Hx of injection in L hip. Pt had greater trochanteric injection on her L side on 09/24/21 - this has helped with tolerance of walking. Patient reports moderate referral to her R lateral thigh proximally. Pt denies paresthesias or numbness in lower body. Pt reports pain along posterolateral gluteal region to greater trochanter R>L side. Pt reports that symptoms began about 4-6 months ago and has progressively worsened. Pt reports fall last year without Fx or major injury ("lot of soreness"). Pt reports intermittent disturbed sleep with turning on her side. Patient reports no bowel/bladder changes. Patient reports hx of sciatica  when she was pregnant, though not in recent years. Pt reports numbness with prolonged sitting LLE. Hx of varicose veins.      PRECAUTIONS: None   OBJECTIVE: (objective measures completed at initial evaluation unless otherwise dated)     DIAGNOSTIC FINDINGS:  CT Scan Chest, abomen, pelvis: negative for metastatic disease, mild degenerative change of bilateral hips.   DXA Bone density scan: T-score of 0.7     PATIENT SURVEYS:  FOTO 47 , predicted goal score of 57   COGNITION:           Overall cognitive status: Within functional limits for tasks assessed                          SENSATION: WFL   POSTURE: Overpronation bilat feet, genu valgum   PALPATION: Tenderness to palpation along R>L greater trochanter, gluteus medius 10/23/21: Tenderness to palpation along Right patellar tendon, LCL, MCL, lateral>medial joint line,  medial/lateral hamstrings; Left medial joint line, MCL, medial/lateral hamstrings.     MUSCLE LENGTH (10/23/21) Hamstring 90/90: R WFL, L WFL Ely's Test: R Positive (90 deg knee flexion in prone), L Positive (90 deg knee flexion in prone) Gastrocnemius: R Poor (DF to neutral with knee extended), L Poor (DF to neutral with knee extended)   LOWER EXTREMITY ROM:   Lumbar spine AROM WNL with exception of significant trunk rotation motion loss R and L; back pain with extension, hip pain with trunk rotation in either direction                                Knee/ankle updated 10/23/21 Active ROM Right eval Left eval  Hip flexion 95 95  Hip extension      Hip abduction 20* 40*  Hip adduction      Hip internal rotation 28 30  Hip external rotation 25 22  Knee flexion  100 deg  101 deg  Knee extension  -2 -2   Ankle dorsiflexion To neutral  To neutral   Ankle plantarflexion      Ankle inversion      Ankle eversion       (Blank rows = not tested) Knee and ankle values taken 10/23/21   *Pain with flexion overpressure bilaterally, pain with extension overpressure on L only     LOWER EXTREMITY MMT:   MMT Right eval Left eval Right 11/14/21 Left 11/14/21  Hip flexion 5 5 5 5   Hip extension          Hip abduction 4-* 4 4+ 4-*  Hip adduction 5 5 5 5   Hip internal rotation 4+ 5 4+ 5  Hip external rotation 4+ 4+ 5 4+  Knee flexion 5 4+ (updated 10/23/21) 5 4+  Knee extension 5 5 4+ 5  Ankle dorsiflexion          Ankle plantarflexion          Ankle inversion          Ankle eversion           (Blank rows = not tested)   LOWER EXTREMITY SPECIAL TESTS:  Hip special tests: Saralyn Pilar (FABER) test: positive  and Hip scouring test: positive , Straight leg raise: Negative bilaterally   10/23/21: McMurray's test: Positive bilaterally      GAIT: Comments: mild compensated Trendelenburg R>L      Quick Screen 11/26/21 Lumbar spine AROM WNL with exception of  significant trunk rotation  motion loss   Sensation Dermatome screen (light touch, L2-L5) WNL   Repeated movement screen Repeated flexion in standing: no effect   Special Testing SLUMP: R negative, L negative   PACE sign: R negative, L negative   SLR: R positive for gluteal pain, L positive for gluteal pain FAIR: R not tested, L positive for lateral hip pain (no calf/leg symptoms)  Reproduction of pain with unilateral and bilateral lower extremity distraction       Palpation Tenderness to palpation along L medial and lateral gastrocnemius, bilateral gluteus maximus and medius as well as deep hip ERs (no reproduction of leg pain with hip palpation)    TODAY'S TREATMENT:   SUBJECTIVE: Patient missed last week due to worsening L lower limb pain affecting L fibular head regions and radiating into her lower leg. Patient reports pain is shooting into her outer ankle. Patient went to ED and had ultrasound to rule out DVT. Patient reports only having lateral hip pain L>R when lying down. Patient reports she was instructed on following up with orthopedic doctor. Pt reports some numbness going through calf and her foot. Pt reports intermittently her LLE feels "like jelly."      PAIN:  Are you having pain? Yes, 5-6/10 pain, affecting R lateral knee primarily         Manual Therapy - for symptom modulation, soft tissue sensitivity and mobility, joint mobility, ROM      Attempted unilateral L and bilateral lower extremity distraction - stopped due to irritation of LE symptoms                                IASTM with Hypervolt along L gluteus medius; x 8 minutes                            *not today*              STM IASTM with Theraband roller along L lateral gastrocemius and peroneal mm; x 4 minutes Manual quadriceps stretch in prone; x 60 sec bilaterally              Mulligan belt lateral distraction in hooklying and mobilization with movement with conjunct hip ER, IR, and flexion; x 3 minutes bilaterally               Bilateral STM and IASTM with Hypervolt along gluteus medius and minimus; x 3 minutes on each side         Therapeutic Exercise - for improved soft tissue flexibility and extensibility as needed for ROM, improved strength as needed to improve performance of CKC activities/functional movements; progressive loading of affected tissues to promote tissue remodeling and healing   *Quick screen for lower quarter*            Supine piriformis stretch; 2x30 sec, LLE today   Seated sciatic nerve flosssing; 2x10, focus on LLE today  -Reviewed proper use of single-point cane to offload painful LLE     PATIENT EDUCATION: Discussed addition of nerve flossing at home and resuming stretching program at home as tolerated. Recommended use of AD for community-level mobility to reduce pain and for safety given episodes of LLE buckling     *next visit* NuStep; 5 minutes, Level 2, seat at 11, arms at 9     *hold today* Lateral step up; 1x10 on each LE, 6-inch  step, staircase in center of gym  Minisquat; 2x10, hands hovering along bilateral handrails, PT guarding posteriorly for pt safety              *not today*  Sidelying clamshell; 2x10, bilateral Supine glute bridge; 2x10, with Blue Tband looped around BLE superior to knees Standing gastrocemius stretch, lunge at wall; 2x30 sec bilateral Prone Quad stretch; 2x30 sec, bilat Hip abductor and adductor isometrics; x10, 10 sec each direction Supine gastrocnemius stretch     PATIENT EDUCATION:  Education details:  see above for patient education details   Person educated: Patient Education method: Explanation, Demonstration, and Handouts Education comprehension: verbalized understanding and returned demonstration     HOME EXERCISE PROGRAM: Access Code [email or web address]   ASSESSMENT:   CLINICAL IMPRESSION: Patient has made fair progress since outset of PT, but unfortunately patient had onset of L leg pain that warranted ED visit last  week. Pt held on PT last week and focused on elevation, anti-inflammatory medical management, and activity modification. Patient presents with shortened stance time on LLE and mild abductor lurch. She has negative testing for piriformis syndrome and lumbar radiculopathy in regard to reproducing concordant leg pain. Pt does have positive SLR with suspected lower limb neural tension. Updated her program to include gentle nerve flossing; pt to continue with activity modification and anti-inflammatories as discussed previously with MD. Pt is following up with orthopedist given notable status change over this previous week. Patient has remaining deficits in bilateral hip and knee ROM, gluteal/hip and hamstrings strength, gait changes, local nociceptive lateral hip pain and anterior knee pain with recent onset of LLE calf/lateral leg pain and paresthesias. Patient will benefit from continued skilled therapeutic intervention to address the above deficits as needed for improved function and QoL.      OBJECTIVE IMPAIRMENTS Abnormal gait, difficulty walking, decreased ROM, decreased strength, hypomobility, and pain.    ACTIVITY LIMITATIONS sitting, standing, squatting, sleeping, stairs, transfers, bed mobility, and locomotion level   PARTICIPATION LIMITATIONS: shopping and community activity   PERSONAL FACTORS Time since onset of injury/illness/exacerbation and 3+ comorbidities: (obesity, HTN, depression, Hx breast cancer, neuropathy)  are also affecting patient's functional outcome.    REHAB POTENTIAL: Good   CLINICAL DECISION MAKING: Evolving/moderate complexity   EVALUATION COMPLEXITY: Moderate     GOALS:   SHORT TERM GOALS: Target date: 11/07/2021  Patient will be independent and compliant with HEP as needed for symptom modulation, mobility, and strength gains to augment in-clinic PT intervention and improve pt function  Baseline: 10/16/21: Baseline HEP initiated.  11/14/21: Excellent HEP compliance and  pt verbalizes her exercises well.  Goal status: ACHIEVED     LONG TERM GOALS: Target date: 11/29/2021    Patient will demonstrate improved function as evidenced by a score of 57 on FOTO measure for full participation in activities at home and in the community. Baseline: 10/16/21 47.    11/14/21: 59/57.  Goal status: ACHIEVED   2.  Patient will report NPRS no greater than 2-3/10 with daily activities as needed for improved ability to perform self-care, access community, and participate in desired life roles  Baseline: 10/16/21: NPRS 6-7/10 at worst.   11/14/21: 5/10 at worst.  Goal status: NOT MET   3.  Patient will have MMT 5/5 for all tested hip motions indicative of improved strength as needed for improved tolerance to loading affected tissues during prolonged weightbearing and closed-chain work Baseline: 10/16/21: MMT R/L Hip abduction 4-/4, Hip IR 4+/5, Hip ER 4+/4+.  11/14/21: MMT R/L Hip abduction 4+/4-, Hip IR 4+/5, Hip ER 5/4+. Goal status: NOT MET   4.  Patient will sleep through the night without disturbed sleep secondary to hip pain on either side as needed for improved sleep quality and long-term wellness Baseline: 10/16/21: Intermittent disturbed sleep when rolling onto R more so than L side.    11/14/21: about one wake-up during the night due to pain.  Goal status: IN PROGRESS   5.  Patient will complete errands in town including ambulating around grocery store/shopping center without increase in pain > 2/10 indicative of community-level mobility without increase in hip symptoms Baseline: 10/16/21: Notable pain with walking, pain worsens with higher volume of ambulatory activity.  11/14/21: Notable pain in knees with this, mild hip pain with completion of errands in community.  Goal status: PARTIALLY MET        PLAN: PT FREQUENCY: 2x/week   PT DURATION: 6 weeks   PLANNED INTERVENTIONS: Therapeutic exercises, Therapeutic activity, Neuromuscular re-education, Balance training,  Gait training, Patient/Family education, Self Care, Joint mobilization, Dry Needling, Electrical stimulation, Cryotherapy, Moist heat, and Manual therapy   PLAN FOR NEXT SESSION: Manual therapy for hip mobility, quad and hamstrings soft tissue mobility, and desensitization; continued stretching and icing for patient's HEP. Progress with hip and hamstring strengthening and knee mobility as able; progress exercise as patient's condition improves. Follow-up on LLE pain flare-up and f/u with orthopedist.        Valentina Gu, PT, DPT #[id number]  Eilleen Kempf, PT 11/26/2021, 11:12 AM

## 2021-11-28 ENCOUNTER — Encounter: Payer: Self-pay | Admitting: Physical Therapy

## 2021-11-28 ENCOUNTER — Encounter (INDEPENDENT_AMBULATORY_CARE_PROVIDER_SITE_OTHER): Payer: Self-pay | Admitting: Family Medicine

## 2021-11-28 ENCOUNTER — Ambulatory Visit (INDEPENDENT_AMBULATORY_CARE_PROVIDER_SITE_OTHER): Payer: Medicare Other | Admitting: Family Medicine

## 2021-11-28 ENCOUNTER — Ambulatory Visit: Payer: Medicare Other | Admitting: Physical Therapy

## 2021-11-28 VITALS — BP 136/82 | HR 60 | Temp 97.8°F | Ht 69.0 in | Wt 315.0 lb

## 2021-11-28 DIAGNOSIS — E559 Vitamin D deficiency, unspecified: Secondary | ICD-10-CM

## 2021-11-28 DIAGNOSIS — R252 Cramp and spasm: Secondary | ICD-10-CM | POA: Diagnosis not present

## 2021-11-28 DIAGNOSIS — E7849 Other hyperlipidemia: Secondary | ICD-10-CM

## 2021-11-28 DIAGNOSIS — I10 Essential (primary) hypertension: Secondary | ICD-10-CM

## 2021-11-28 DIAGNOSIS — M25551 Pain in right hip: Secondary | ICD-10-CM

## 2021-11-28 DIAGNOSIS — R7303 Prediabetes: Secondary | ICD-10-CM | POA: Diagnosis not present

## 2021-11-28 DIAGNOSIS — M25552 Pain in left hip: Secondary | ICD-10-CM

## 2021-11-28 DIAGNOSIS — R262 Difficulty in walking, not elsewhere classified: Secondary | ICD-10-CM

## 2021-11-28 DIAGNOSIS — M6281 Muscle weakness (generalized): Secondary | ICD-10-CM

## 2021-11-28 DIAGNOSIS — Z6841 Body Mass Index (BMI) 40.0 and over, adult: Secondary | ICD-10-CM

## 2021-11-28 DIAGNOSIS — E669 Obesity, unspecified: Secondary | ICD-10-CM

## 2021-11-28 MED ORDER — HYDROCHLOROTHIAZIDE 12.5 MG PO CAPS
12.5000 mg | ORAL_CAPSULE | Freq: Every day | ORAL | 0 refills | Status: DC
Start: 2021-11-28 — End: 2021-12-31

## 2021-11-28 NOTE — Therapy (Signed)
OUTPATIENT PHYSICAL THERAPY TREATMENT NOTE   Patient Name: Michelle Mcmillan MRN: [id number] DOB:26-Apr-1970, 51 y.o., female Today's Date: 11/28/2021   END OF SESSION:   PT End of Session - 11/28/21 1508     Visit Number 12    Number of Visits 18    Date for PT Re-Evaluation 12/20/21    Authorization Type UHC Medicare/Medicaid, VL based on medical necessity    PT Start Time 0931    PT Stop Time 1018    PT Time Calculation (min) 47 min    Activity Tolerance Patient tolerated treatment well    Behavior During Therapy [facility] for tasks assessed/performed             Past Medical History:  Diagnosis Date   Anemia    H/O   Anemia    Arthritis    RIGHT KNEE   Back pain    Breast cancer ([place]) 2017   right breast   Cancer ([place])    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)   Carpal tunnel syndrome    Constipation    Depression    Dyspnea    GERD (gastroesophageal reflux disease)    NO MEDS   Headache    migraines   Hypertension    Joint pain    Last menstrual period (LMP) > 10 days ago 08/2015   Lower extremity edema    Migraines    Neuropathy    OSA (obstructive sleep apnea)    Personal history of radiation therapy    Prediabetes    SOB (shortness of breath)    Past Surgical History:  Procedure Laterality Date   BREAST BIOPSY Right 05/08/2017   invasive mamm. ca   BREAST EXCISIONAL BIOPSY Right 06/15/15   breast cancer   BREAST LUMPECTOMY WITH NEEDLE LOCALIZATION Right 06/15/2015   Procedure: BREAST LUMPECTOMY WITH NEEDLE LOCALIZATION;  Surgeon: Hubbard Robinson, MD;  Location: [facility];  Service: General;  Laterality: Right;   BREAST RECONSTRUCTION WITH PLACEMENT OF TISSUE EXPANDER AND FLEX HD (ACELLULAR HYDRATED DERMIS) Left 02/09/2018   Procedure: BREAST RECONSTRUCTION WITH PLACEMENT OF TISSUE EXPANDER AND FLEX HD (ACELLULAR HYDRATED DERMIS);  Surgeon: Wallace Going, DO;  Location: [facility];  Service: Plastics;  Laterality: Left;   BREAST SURGERY     CESAREAN SECTION   1988   x1   COLONOSCOPY WITH PROPOFOL N/A 01/23/2017   Procedure: COLONOSCOPY WITH PROPOFOL;  Surgeon: Lin Landsman, MD;  Location[facility];  Service: Gastroenterology;  Laterality: N/A;   ESOPHAGOGASTRODUODENOSCOPY (EGD) WITH PROPOFOL N/A 01/23/2017   Procedure: ESOPHAGOGASTRODUODENOSCOPY (EGD) WITH PROPOFOL;  Surgeon: Lin Landsman, MD;  Location: [facility] ENDOSCOPY;  Service: Gastroenterology;  Laterality: N/A;   LATISSIMUS FLAP TO BREAST Right 06/01/2018   Procedure: RIGHT BREAST RECONSTRUCTION WITH LATISSIMUS MYOCUTANEOUS FLAP;  Surgeon: Wallace Going, DO;  Location: [place];  Service: Plastics;  Laterality: Right;   LIPOSUCTION WITH LIPOFILLING Bilateral 10/15/2018   Procedure: LIPOSUCTION TO BILATERAL BREASTS;  Surgeon: Wallace Going, DO;  Location: [place];  Service: Plastics;  Laterality: Bilateral;   LIPOSUCTION WITH LIPOFILLING Bilateral 04/22/2019   Procedure: fat grafting/liposuction and lipofilling bilateral breasts;  Surgeon: Wallace Going, DO;  Location: [place];  Service: Plastics;  Laterality: Bilateral;   MASS EXCISION N/A 05/20/2016   Procedure: EXCISION CHEST WALL MASS;  Surgeon: Florene Glen, MD;  Location: [facility];  Service: General;  Laterality: N/A;   RECONSTRUCTION BREAST W/ LATISSIMUS DORSI FLAP Right 06/01/2018   REMOVAL OF  BILATERAL TISSUE EXPANDERS WITH PLACEMENT OF BILATERAL BREAST IMPLANTS Bilateral 10/15/2018   Procedure: REMOVAL OF BILATERAL TISSUE EXPANDERS WITH PLACEMENT OF BILATERAL BREAST IMPLANTS;  Surgeon: Wallace Going, DO;  Location: [place];  Service: Plastics;  Laterality: Bilateral;   SCAR REVISION Bilateral 04/22/2019   Procedure: Excision of bilateral scar contracture;  Surgeon: Wallace Going, DO;  Location: [place];  Service: Plastics;  Laterality: Bilateral;  total case 90 min   SENTINEL NODE BIOPSY Right 06/15/2015   Procedure:  SENTINEL NODE BIOPSY;  Surgeon: Hubbard Robinson, MD;  Location: [facility];  Service: General;  Laterality: Right;   TOTAL MASTECTOMY Right 05/22/2017   Procedure: TOTAL MASTECTOMY;  Surgeon: Florene Glen, MD;  Location: [facility];  Service: General;  Laterality: Right;   TOTAL MASTECTOMY Left 02/09/2018   Procedure: LEFT PROPHYLACTIC  MASTECTOMY;  Surgeon: Jovita Kussmaul, MD;  Location: [facility];  Service: General;  Laterality: Left;   TUBAL LIGATION  2004   ULNAR NERVE TRANSPOSITION Left 01/29/2019   Procedure: LEFT ULNAR NERVE DECOMPRESSION/TRANSPOSITION;  Surgeon: Leanora Cover, MD;  Location: [place];  Service: Orthopedics;  Laterality: Left;   Patient Active Problem List   Diagnosis Date Noted   Genetic testing 11/02/2021   Elevated blood pressure reading in office with white coat syndrome, with diagnosis of hypertension 08/08/2021   Limited joint range of motion (ROM) 07/19/2021   Class 3 severe obesity due to excess calories with body mass index (BMI) of 45.0 to 49.9 in adult ([place]) 06/25/2021   Prediabetes 06/21/2021   Elevated ferritin level 06/21/2021   OSA on CPAP 04/20/2021   Migraine headache without aura 07/21/2020   Ulnar neuropathy 08/28/2018   S/P breast reconstruction, bilateral 06/16/2018   Acquired absence of right breast 06/01/2018   S/P mastectomy, bilateral 02/17/2018   Acquired absence of left breast 02/09/2018   Acquired absence of breast and absent nipple, right 01/02/2018   Postoperative breast asymmetry 01/02/2018   Intractable nausea and vomiting 08/11/2017   Breast cancer ([place]) 05/22/2017   Morbid (severe) obesity due to excess calories ([place]) 04/15/2017   Microcytic anemia 04/15/2017   GERD (gastroesophageal reflux disease) 04/15/2017   Chest wall mass    Malignant neoplasm of upper-outer quadrant of right female breast ([place]) 05/25/2015   HTN (hypertension) 05/24/2015      PCP: Donnie Coffin, MD REFERRING PROVIDER: Esaw Grandchild, NP   REFERRING DIAG: M70.62 (ICD-10-CM) - Trochanteric bursitis of left hip   THERAPY DIAG:  Pain in right hip   Pain in left hip   Difficulty in walking, not elsewhere classified   Muscle weakness (generalized)   Rationale for Evaluation and Treatment Rehabilitation   PERTINENT HISTORY: Patient is a 51 year old female with complaint of bilateral hip pain, more pain on R at this time due to Hx of injection in L hip. Pt had greater trochanteric injection on her L side on 09/24/21 - this has helped with tolerance of walking. Patient reports moderate referral to her R lateral thigh proximally. Pt denies paresthesias or numbness in lower body. Pt reports pain along posterolateral gluteal region to greater trochanter R>L side. Pt reports that symptoms began about 4-6 months ago and has progressively worsened. Pt reports fall last year without Fx or major injury ("lot of soreness"). Pt reports intermittent disturbed sleep with turning on her side. Patient reports no bowel/bladder changes. Patient reports hx of sciatica when she was pregnant, though not in recent  years. Pt reports numbness with prolonged sitting LLE. Hx of varicose veins.      PRECAUTIONS: None   OBJECTIVE: (objective measures completed at initial evaluation unless otherwise dated)     DIAGNOSTIC FINDINGS:  CT Scan Chest, abomen, pelvis: negative for metastatic disease, mild degenerative change of bilateral hips.   DXA Bone density scan: T-score of 0.7     PATIENT SURVEYS:  FOTO 47 , predicted goal score of 57   COGNITION:           Overall cognitive status: Within functional limits for tasks assessed                          SENSATION: WFL   POSTURE: Overpronation bilat feet, genu valgum   PALPATION: Tenderness to palpation along R>L greater trochanter, gluteus medius 10/23/21: Tenderness to palpation along Right patellar tendon, LCL, MCL, lateral>medial joint line, medial/lateral hamstrings; Left medial  joint line, MCL, medial/lateral hamstrings.     MUSCLE LENGTH (10/23/21) Hamstring 90/90: R WFL, L WFL Ely's Test: R Positive (90 deg knee flexion in prone), L Positive (90 deg knee flexion in prone) Gastrocnemius: R Poor (DF to neutral with knee extended), L Poor (DF to neutral with knee extended)   LOWER EXTREMITY ROM:   Lumbar spine AROM WNL with exception of significant trunk rotation motion loss R and L; back pain with extension, hip pain with trunk rotation in either direction                                Knee/ankle updated 10/23/21 Active ROM Right eval Left eval  Hip flexion 95 95  Hip extension      Hip abduction 20* 40*  Hip adduction      Hip internal rotation 28 30  Hip external rotation 25 22  Knee flexion  100 deg  101 deg  Knee extension  -2 -2   Ankle dorsiflexion To neutral  To neutral   Ankle plantarflexion      Ankle inversion      Ankle eversion       (Blank rows = not tested) Knee and ankle values taken 10/23/21   *Pain with flexion overpressure bilaterally, pain with extension overpressure on L only     LOWER EXTREMITY MMT:   MMT Right eval Left eval Right 11/14/21 Left 11/14/21  Hip flexion 5 5 5 5   Hip extension          Hip abduction 4-* 4 4+ 4-*  Hip adduction 5 5 5 5   Hip internal rotation 4+ 5 4+ 5  Hip external rotation 4+ 4+ 5 4+  Knee flexion 5 4+ (updated 10/23/21) 5 4+  Knee extension 5 5 4+ 5  Ankle dorsiflexion          Ankle plantarflexion          Ankle inversion          Ankle eversion           (Blank rows = not tested)   LOWER EXTREMITY SPECIAL TESTS:  Hip special tests: Saralyn Pilar (FABER) test: positive  and Hip scouring test: positive , Straight leg raise: Negative bilaterally   10/23/21: McMurray's test: Positive bilaterally      GAIT: Comments: mild compensated Trendelenburg R>L        Quick Screen 11/26/21 Lumbar spine AROM WNL with exception of significant trunk rotation motion loss  Sensation Dermatome  screen (light touch, L2-L5) WNL    Repeated movement screen Repeated flexion in standing: no effect    Special Testing SLUMP: R negative, L negative              PACE sign: R negative, L negative              SLR: R positive for gluteal pain, L positive for gluteal pain FAIR: R not tested, L positive for lateral hip pain (no calf/leg symptoms)   Reproduction of pain with unilateral and bilateral lower extremity distraction                              Palpation Tenderness to palpation along L medial and lateral gastrocnemius, bilateral gluteus maximus and medius as well as deep hip ERs (no reproduction of leg pain with hip palpation)       TODAY'S TREATMENT:   SUBJECTIVE: Patient repots ongoing L lateral leg pain along distal thigh/proximal calf. Patient has f/u with orthopedic office tomorrow at 1:15 PM. She reports discomfort into her L shin/lateral gastroc region. She reports doing well after Tuesday and not needing to use her walker much. She reports more pain later that day after prolonged sitting. Pt reports episode of LLE buckling this AM.      PAIN:  Are you having pain? Yes, 4/10 pain, affecting R lateral knee primarily         Manual Therapy - for symptom modulation, soft tissue sensitivity and mobility, joint mobility, ROM       STM and IASTM with Theraband roller along L rectus femoris and vastus lateralis mm; x 8 minutes               DTM/TPR L gluteus medius x 3 minutes              IASTM with Hypervolt along L gluteus medius; x 10 minutes                Concordant lower limb pain reproduced with deep palpation of gluteal mm.                *not today* Attempted unilateral L and bilateral lower extremity distraction - stopped due to irritation of LE symptoms           Manual quadriceps stretch in prone; x 60 sec bilaterally              Mulligan belt lateral distraction in hooklying and mobilization with movement with conjunct hip ER, IR, and flexion; x 3  minutes bilaterally              Bilateral STM and IASTM with Hypervolt along gluteus medius and minimus; x 3 minutes on each side         Therapeutic Exercise - for improved soft tissue flexibility and extensibility as needed for ROM, improved strength as needed to improve performance of CKC activities/functional movements; progressive loading of affected tissues to promote tissue remodeling and healing            Supine piriformis stretch; 2x30 sec, LLE today   Seated sciatic nerve flosssing; 2x10, focus on LLE today  NuStep; 5 minutes, Level 2, seat at 11, arms at 9 - patient education during part of time on bike, 3 minutes unbilled    PATIENT EDUCATION: Discussed findings of assessment last visit and limited concordant sign with tests for deep gluteal syndrome and lumbar  radiculopathy. Discussed temporary use of AD for safety given Hx of LE buckling and to offload painful lower limb.        *hold today* Lateral step up; 1x10 on each LE, 6-inch step, staircase in center of gym  Minisquat; 2x10, hands hovering along bilateral handrails, PT guarding posteriorly for pt safety              *not today*  Sidelying clamshell; 2x10, bilateral Supine glute bridge; 2x10, with Blue Tband looped around BLE superior to knees Standing gastrocemius stretch, lunge at wall; 2x30 sec bilateral Prone Quad stretch; 2x30 sec, bilat Hip abductor and adductor isometrics; x10, 10 sec each direction Supine gastrocnemius stretch     PATIENT EDUCATION:  Education details:  see above for patient education details   Person educated: Patient Education method: Consulting civil engineer, Demonstration, and Handouts Education comprehension: verbalized understanding and returned demonstration     HOME EXERCISE PROGRAM: Access Code [email or web address]   ASSESSMENT:   CLINICAL IMPRESSION: Patient to f/u with orthopedist tomorrow given new pain along L lateral thigh distally and L calf/lateral leg and debilitating pain which  led to ED visit last Monday. DVT was ruled out. Pt has concordant pain with gluteus medius/minimus palpation; pt had difficulty tolerating TPR, but had fair response with IASTM and reported improvement post-treatment. Pt has made notable progress with PT, but her current condition is complicated by bilateral hip and knee involvement with new onset of L leg pain. Patient has remaining deficits in bilateral hip and knee ROM, gluteal/hip and hamstrings strength, gait changes, local nociceptive lateral hip pain and anterior knee pain with recent onset of LLE calf/lateral leg pain and paresthesias. Patient will benefit from continued skilled therapeutic intervention to address the above deficits as needed for improved function and QoL.      OBJECTIVE IMPAIRMENTS Abnormal gait, difficulty walking, decreased ROM, decreased strength, hypomobility, and pain.    ACTIVITY LIMITATIONS sitting, standing, squatting, sleeping, stairs, transfers, bed mobility, and locomotion level   PARTICIPATION LIMITATIONS: shopping and community activity   PERSONAL FACTORS Time since onset of injury/illness/exacerbation and 3+ comorbidities: (obesity, HTN, depression, Hx breast cancer, neuropathy)  are also affecting patient's functional outcome.    REHAB POTENTIAL: Good   CLINICAL DECISION MAKING: Evolving/moderate complexity   EVALUATION COMPLEXITY: Moderate     GOALS:   SHORT TERM GOALS: Target date: 11/07/2021  Patient will be independent and compliant with HEP as needed for symptom modulation, mobility, and strength gains to augment in-clinic PT intervention and improve pt function  Baseline: 10/16/21: Baseline HEP initiated.  11/14/21: Excellent HEP compliance and pt verbalizes her exercises well.  Goal status: ACHIEVED     LONG TERM GOALS: Target date: 11/29/2021    Patient will demonstrate improved function as evidenced by a score of 57 on FOTO measure for full participation in activities at home and in the  community. Baseline: 10/16/21 47.    11/14/21: 59/57.  Goal status: ACHIEVED   2.  Patient will report NPRS no greater than 2-3/10 with daily activities as needed for improved ability to perform self-care, access community, and participate in desired life roles  Baseline: 10/16/21: NPRS 6-7/10 at worst.   11/14/21: 5/10 at worst.  Goal status: NOT MET   3.  Patient will have MMT 5/5 for all tested hip motions indicative of improved strength as needed for improved tolerance to loading affected tissues during prolonged weightbearing and closed-chain work Baseline: 10/16/21: MMT R/L Hip abduction 4-/4, Hip IR 4+/5, Hip ER 4+/4+.  11/14/21: MMT R/L Hip abduction 4+/4-, Hip IR 4+/5, Hip ER 5/4+. Goal status: NOT MET   4.  Patient will sleep through the night without disturbed sleep secondary to hip pain on either side as needed for improved sleep quality and long-term wellness Baseline: 10/16/21: Intermittent disturbed sleep when rolling onto R more so than L side.    11/14/21: about one wake-up during the night due to pain.  Goal status: IN PROGRESS   5.  Patient will complete errands in town including ambulating around grocery store/shopping center without increase in pain > 2/10 indicative of community-level mobility without increase in hip symptoms Baseline: 10/16/21: Notable pain with walking, pain worsens with higher volume of ambulatory activity.  11/14/21: Notable pain in knees with this, mild hip pain with completion of errands in community.  Goal status: PARTIALLY MET        PLAN: PT FREQUENCY: 2x/week   PT DURATION: 6 weeks   PLANNED INTERVENTIONS: Therapeutic exercises, Therapeutic activity, Neuromuscular re-education, Balance training, Gait training, Patient/Family education, Self Care, Joint mobilization, Dry Needling, Electrical stimulation, Cryotherapy, Moist heat, and Manual therapy   PLAN FOR NEXT SESSION: Manual therapy for hip mobility, quad and hamstrings soft tissue mobility,  and desensitization; continued stretching and icing for patient's HEP. Progress with hip and hamstring strengthening and knee mobility as able; progress exercise as patient's condition improves. Follow-up on LLE pain flare-up and f/u with orthopedist.       Valentina Gu, PT, DPT #[id number]  Eilleen Kempf, PT 11/28/2021, 3:09 PM

## 2021-11-29 ENCOUNTER — Ambulatory Visit: Payer: Medicare Other | Admitting: Physician Assistant

## 2021-11-29 ENCOUNTER — Encounter: Payer: Self-pay | Admitting: Physician Assistant

## 2021-11-29 DIAGNOSIS — G8929 Other chronic pain: Secondary | ICD-10-CM | POA: Insufficient documentation

## 2021-11-29 DIAGNOSIS — M25562 Pain in left knee: Secondary | ICD-10-CM | POA: Diagnosis not present

## 2021-11-29 MED ORDER — BUPIVACAINE HCL 0.25 % IJ SOLN
2.0000 mL | INTRAMUSCULAR | Status: AC | PRN
  Administered 2021-11-29: 2 mL via INTRA_ARTICULAR

## 2021-11-29 MED ORDER — LIDOCAINE HCL 1 % IJ SOLN
2.0000 mL | INTRAMUSCULAR | Status: AC | PRN
  Administered 2021-11-29: 2 mL

## 2021-11-29 MED ORDER — METHYLPREDNISOLONE ACETATE 40 MG/ML IJ SUSP
80.0000 mg | INTRAMUSCULAR | Status: AC | PRN
  Administered 2021-11-29: 80 mg via INTRA_ARTICULAR

## 2021-11-29 NOTE — Progress Notes (Signed)
Office Visit Note   Patient: Michelle Mcmillan           Date of Birth: 01-Mar-1971           MRN: [id number] Visit Date: 11/29/2021              Requested by: Michelle Coffin, MD [address],  [place] [address] PCP: Michelle Coffin, MD  Chief Complaint  Patient presents with   Left Lower Leg - Follow-up      HPI: Patient is a pleasant 51 year old woman who sees Michelle Mcmillan.  He has seen her for hip pain and knee pain.  She has been doing physical therapy.  She states that recently after physical therapy session involving go up and down stairs and doing some light squats she began having significant pain over the lateral left proximal tibia.  It was significant enough that she went to the emergency room where an ultrasound was done and ruled out a DVT.  She does get night pain with this.  She denies any radicular pain coming down her back.  She does feel some painful popping and clicking.  She denies any fever or chills.  She traces the pain from the lateral side of the knee joint and posterior down the side of the leg to about mid calf.  She is ambulating with a cane which she normally does not use.  She has tried some topical Voltaren gel which helps a little bit  Assessment & Plan: Visit Diagnoses:  1. 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  Worst in the patellofemoral joint.  She did have symptoms appear after doing exercises and following squats and stairs.  She says she has reproduction of her pain with straightening of the leg.  Certainly this could be from her knee arthritis or compensatory muscle strain from an antalgic gait.  I will try a steroid injection today to see how she does.  She is to return to physical therapy hopefully they can do some ultrasound treatment and other modalities.  She will contact me or Gill if she does not have significant relief  Follow-Up Instructions: No  follow-ups on file.   Ortho Exam  Patient is alert, oriented, no adenopathy, well-dressed, normal affect, normal respiratory effort. Examination of her left lower extremity.  She has no erythema no redness no warmth.  She has good strength with dorsiflexion plantarflexion extension flexion of her legs.  She is focally tender from the lateral posterior joint line down into the lateral calf.  Negative Homans' sign.  Sensation is intact.  Pain is increased with terminal extension  Imaging: No results found. No images are attached to the encounter.  Labs: Lab Results  Component Value Date   HGBA1C 6.0 (H) 06/20/2021     Lab Results  Component Value Date   ALBUMIN 3.8 09/06/2021   ALBUMIN 3.7 (L) 06/20/2021   ALBUMIN 3.4 (L) 10/21/2017    Lab Results  Component Value Date   MG 1.8 08/12/2017   MG 2.1 08/11/2017   MG 1.4 (L) 08/11/2017   Lab Results  Component Value Date   VD25OH 45.2 06/20/2021    No results found for: "PREALBUMIN"    Latest Ref Rng & Units 11/19/2021    4:39 PM 09/06/2021   12:52 PM 06/20/2021    9:13 AM  CBC EXTENDED  WBC 4.0 - 10.5 K/uL  6.3  7.1    RBC 3.87 - 5.11 MIL/uL 4.48  4.86    Hemoglobin 12.0 - 15.0 g/dL 11.6  12.7    HCT 36.0 - 46.0 % 37.6  41.0  36.3   Platelets 150 - 400 K/uL 220  239    NEUT# 1.7 - 7.7 K/uL  4.8    Lymph# 0.7 - 4.0 K/uL  1.7       There is no height or weight on file to calculate BMI.  Orders:  No orders of the defined types were placed in this encounter.  No orders of the defined types were placed in this encounter.    Procedures: Large Joint Inj: L knee on 11/29/2021 1:56 PM Indications: pain and diagnostic evaluation Details: 25 G 1.5 in needle, anterolateral approach  Arthrogram: No  Medications: 80 mg methylPREDNISolone acetate 40 MG/ML; 2 mL lidocaine 1 %; 2 mL bupivacaine 0.25 % Outcome: tolerated well, no immediate complications Procedure, treatment alternatives, risks and benefits explained, specific  risks discussed. Consent was given by the patient.    Clinical Data: No additional findings.  ROS:  All other systems negative, except as noted in the HPI. Review of Systems  Objective: Vital Signs: There were no vitals taken for this visit.  Specialty Comments:  No specialty comments available.  PMFS History: Patient Active Problem List   Diagnosis Date Noted   Pain in left knee 11/29/2021   Genetic testing 11/02/2021   Elevated blood pressure reading in office with white coat syndrome, with diagnosis of hypertension 08/08/2021   Limited joint range of motion (ROM) 07/19/2021   Class 3 severe obesity due to excess calories with body mass index (BMI) of 45.0 to 49.9 in adult ([place]) 06/25/2021   Prediabetes 06/21/2021   Elevated ferritin level 06/21/2021   OSA on CPAP 04/20/2021   Migraine headache without aura 07/21/2020   Ulnar neuropathy 08/28/2018   S/P breast reconstruction, bilateral 06/16/2018   Acquired absence of right breast 06/01/2018   S/P mastectomy, bilateral 02/17/2018   Acquired absence of left breast 02/09/2018   Acquired absence of breast and absent nipple, right 01/02/2018   Postoperative breast asymmetry 01/02/2018   Intractable nausea and vomiting 08/11/2017   Breast cancer ([place]) 05/22/2017   Morbid (severe) obesity due to excess calories ([facility]) 04/15/2017   Microcytic anemia 04/15/2017   GERD (gastroesophageal reflux disease) 04/15/2017   Chest wall mass    Malignant neoplasm of upper-outer quadrant of right female breast ([facility]) 05/25/2015   HTN (hypertension) 05/24/2015   Past Medical History:  Diagnosis Date   Anemia    H/O   Anemia    Arthritis    RIGHT KNEE   Back pain    Breast cancer ([facility]) 2017   right breast   Cancer ([place])    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)   Carpal tunnel syndrome    Constipation    Depression    Dyspnea    GERD (gastroesophageal reflux disease)    NO MEDS   Headache    migraines   Hypertension    Joint pain     Last menstrual period (LMP) > 10 days ago 08/2015   Lower extremity edema    Migraines    Neuropathy    OSA (obstructive sleep apnea)    Personal history of radiation therapy    Prediabetes    SOB (shortness of breath)     Family History  Problem Relation Age of Onset   Hypertension Mother    Obesity  Mother    Hypertension Father    Diabetes Father    Hypertension Sister    Breast cancer Paternal Aunt        dx 59s   Breast cancer Paternal Aunt        dx 33s   Breast cancer Cousin 62    Past Surgical History:  Procedure Laterality Date   BREAST BIOPSY Right 05/08/2017   invasive mamm. ca   BREAST EXCISIONAL BIOPSY Right 06/15/15   breast cancer   BREAST LUMPECTOMY WITH NEEDLE LOCALIZATION Right 06/15/2015   Procedure: BREAST LUMPECTOMY WITH NEEDLE LOCALIZATION;  Surgeon: Hubbard Robinson, MD;  Location: [facility];  Service: General;  Laterality: Right;   BREAST RECONSTRUCTION WITH PLACEMENT OF TISSUE EXPANDER AND FLEX HD (ACELLULAR HYDRATED DERMIS) Left 02/09/2018   Procedure: BREAST RECONSTRUCTION WITH PLACEMENT OF TISSUE EXPANDER AND FLEX HD (ACELLULAR HYDRATED DERMIS);  Surgeon: Wallace Going, DO;  Location: [facility];  Service: Plastics;  Laterality: Left;   BREAST SURGERY     CESAREAN SECTION  1988   x1   COLONOSCOPY WITH PROPOFOL N/A 01/23/2017   Procedure: COLONOSCOPY WITH PROPOFOL;  Surgeon: Lin Landsman, MD;  Location: [facility];  Service: Gastroenterology;  Laterality: N/A;   ESOPHAGOGASTRODUODENOSCOPY (EGD) WITH PROPOFOL N/A 01/23/2017   Procedure: ESOPHAGOGASTRODUODENOSCOPY (EGD) WITH PROPOFOL;  Surgeon: Lin Landsman, MD;  Location[facility];  Service: Gastroenterology;  Laterality: N/A;   LATISSIMUS FLAP TO BREAST Right 06/01/2018   Procedure: RIGHT BREAST RECONSTRUCTION WITH LATISSIMUS MYOCUTANEOUS FLAP;  Surgeon: Wallace Going, DO;  Location: [place];  Service: Plastics;  Laterality: Right;   LIPOSUCTION WITH LIPOFILLING  Bilateral 10/15/2018   Procedure: LIPOSUCTION TO BILATERAL BREASTS;  Surgeon: Wallace Going, DO;  Location: [facility];  Service: Plastics;  Laterality: Bilateral;   LIPOSUCTION WITH LIPOFILLING Bilateral 04/22/2019   Procedure: fat grafting/liposuction and lipofilling bilateral breasts;  Surgeon: Wallace Going, DO;  Location: [facility];  Service: Plastics;  Laterality: Bilateral;   MASS EXCISION N/A 05/20/2016   Procedure: EXCISION CHEST WALL MASS;  Surgeon: Florene Glen, MD;  Location: [facility];  Service: General;  Laterality: N/A;   RECONSTRUCTION BREAST W/ LATISSIMUS DORSI FLAP Right 06/01/2018   REMOVAL OF BILATERAL TISSUE EXPANDERS WITH PLACEMENT OF BILATERAL BREAST IMPLANTS Bilateral 10/15/2018   Procedure: REMOVAL OF BILATERAL TISSUE EXPANDERS WITH PLACEMENT OF BILATERAL BREAST IMPLANTS;  Surgeon: Wallace Going, DO;  Location: [place];  Service: Plastics;  Laterality: Bilateral;   SCAR REVISION Bilateral 04/22/2019   Procedure: Excision of bilateral scar contracture;  Surgeon: Wallace Going, DO;  Location: [facility];  Service: Plastics;  Laterality: Bilateral;  total case 90 min   SENTINEL NODE BIOPSY Right 06/15/2015   Procedure: SENTINEL NODE BIOPSY;  Surgeon: Hubbard Robinson, MD;  Location: [facility];  Service: General;  Laterality: Right;   TOTAL MASTECTOMY Right 05/22/2017   Procedure: TOTAL MASTECTOMY;  Surgeon: Florene Glen, MD;  Location: [facility];  Service: General;  Laterality: Right;   TOTAL MASTECTOMY Left 02/09/2018   Procedure: LEFT PROPHYLACTIC  MASTECTOMY;  Surgeon: Jovita Kussmaul, MD;  Location: [facility];  Service: General;  Laterality: Left;   TUBAL LIGATION  2004   ULNAR NERVE TRANSPOSITION Left 01/29/2019   Procedure: LEFT ULNAR NERVE DECOMPRESSION/TRANSPOSITION;  Surgeon: Leanora Cover, MD;  Location: [facility];  Service: Orthopedics;  Laterality: Left;    Social History   Occupational History   Occupation:  Disabled  Tobacco Use   Smoking status: Never   Smokeless tobacco: Never  Vaping Use   Vaping Use: Never used  Substance and Sexual Activity   Alcohol use: Not Currently   Drug use: No   Sexual activity: Not Currently

## 2021-12-04 ENCOUNTER — Ambulatory Visit: Payer: Medicare Other | Attending: Adult Health | Admitting: Physical Therapy

## 2021-12-04 ENCOUNTER — Encounter: Payer: Self-pay | Admitting: Physical Therapy

## 2021-12-04 DIAGNOSIS — M25551 Pain in right hip: Secondary | ICD-10-CM | POA: Insufficient documentation

## 2021-12-04 DIAGNOSIS — M6281 Muscle weakness (generalized): Secondary | ICD-10-CM | POA: Diagnosis present

## 2021-12-04 DIAGNOSIS — M25552 Pain in left hip: Secondary | ICD-10-CM | POA: Insufficient documentation

## 2021-12-04 DIAGNOSIS — R262 Difficulty in walking, not elsewhere classified: Secondary | ICD-10-CM | POA: Diagnosis present

## 2021-12-04 NOTE — Therapy (Signed)
OUTPATIENT PHYSICAL THERAPY TREATMENT NOTE   Patient Name: Michelle Mcmillan MRN: [id number] DOB:Jan 15, 1971, 51 y.o., female Today's Date: 12/05/2021   END OF SESSION:   PT End of Session - 12/04/21 0957     Visit Number 13    Number of Visits 18    Date for PT Re-Evaluation 12/20/21    Authorization Type UHC Medicare/Medicaid, VL based on medical necessity    PT Start Time 0958    PT Stop Time 1040    PT Time Calculation (min) 42 min    Activity Tolerance Patient tolerated treatment well    Behavior During Therapy [facility] for tasks assessed/performed             Past Medical History:  Diagnosis Date   Anemia    H/O   Anemia    Arthritis    RIGHT KNEE   Back pain    Breast cancer ([place]) 2017   right breast   Cancer ([place])    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)   Carpal tunnel syndrome    Constipation    Depression    Dyspnea    GERD (gastroesophageal reflux disease)    NO MEDS   Headache    migraines   Hypertension    Joint pain    Last menstrual period (LMP) > 10 days ago 08/2015   Lower extremity edema    Migraines    Neuropathy    OSA (obstructive sleep apnea)    Personal history of radiation therapy    Prediabetes    SOB (shortness of breath)    Past Surgical History:  Procedure Laterality Date   BREAST BIOPSY Right 05/08/2017   invasive mamm. ca   BREAST EXCISIONAL BIOPSY Right 06/15/15   breast cancer   BREAST LUMPECTOMY WITH NEEDLE LOCALIZATION Right 06/15/2015   Procedure: BREAST LUMPECTOMY WITH NEEDLE LOCALIZATION;  Surgeon: Hubbard Robinson, MD;  Location: [facility];  Service: General;  Laterality: Right;   BREAST RECONSTRUCTION WITH PLACEMENT OF TISSUE EXPANDER AND FLEX HD (ACELLULAR HYDRATED DERMIS) Left 02/09/2018   Procedure: BREAST RECONSTRUCTION WITH PLACEMENT OF TISSUE EXPANDER AND FLEX HD (ACELLULAR HYDRATED DERMIS);  Surgeon: Wallace Going, DO;  Location: [facility];  Service: Plastics;  Laterality: Left;   BREAST SURGERY     CESAREAN SECTION   1988   x1   COLONOSCOPY WITH PROPOFOL N/A 01/23/2017   Procedure: COLONOSCOPY WITH PROPOFOL;  Surgeon: Lin Landsman, MD;  Location: [facility];  Service: Gastroenterology;  Laterality: N/A;   ESOPHAGOGASTRODUODENOSCOPY (EGD) WITH PROPOFOL N/A 01/23/2017   Procedure: ESOPHAGOGASTRODUODENOSCOPY (EGD) WITH PROPOFOL;  Surgeon: Lin Landsman, MD;  Location: [facility];  Service: Gastroenterology;  Laterality: N/A;   LATISSIMUS FLAP TO BREAST Right 06/01/2018   Procedure: RIGHT BREAST RECONSTRUCTION WITH LATISSIMUS MYOCUTANEOUS FLAP;  Surgeon: Wallace Going, DO;  Location: [place];  Service: Plastics;  Laterality: Right;   LIPOSUCTION WITH LIPOFILLING Bilateral 10/15/2018   Procedure: LIPOSUCTION TO BILATERAL BREASTS;  Surgeon: Wallace Going, DO;  Location: [place];  Service: Plastics;  Laterality: Bilateral;   LIPOSUCTION WITH LIPOFILLING Bilateral 04/22/2019   Procedure: fat grafting/liposuction and lipofilling bilateral breasts;  Surgeon: Wallace Going, DO;  Location: [place];  Service: Plastics;  Laterality: Bilateral;   MASS EXCISION N/A 05/20/2016   Procedure: EXCISION CHEST WALL MASS;  Surgeon: Florene Glen, MD;  Location: [facility];  Service: General;  Laterality: N/A;   RECONSTRUCTION BREAST W/ LATISSIMUS DORSI FLAP Right 06/01/2018   REMOVAL OF  BILATERAL TISSUE EXPANDERS WITH PLACEMENT OF BILATERAL BREAST IMPLANTS Bilateral 10/15/2018   Procedure: REMOVAL OF BILATERAL TISSUE EXPANDERS WITH PLACEMENT OF BILATERAL BREAST IMPLANTS;  Surgeon: Wallace Going, DO;  Location: [place];  Service: Plastics;  Laterality: Bilateral;   SCAR REVISION Bilateral 04/22/2019   Procedure: Excision of bilateral scar contracture;  Surgeon: Wallace Going, DO;  Location: [place];  Service: Plastics;  Laterality: Bilateral;  total case 90 min   SENTINEL NODE BIOPSY Right 06/15/2015   Procedure:  SENTINEL NODE BIOPSY;  Surgeon: Hubbard Robinson, MD;  Location: [facility];  Service: General;  Laterality: Right;   TOTAL MASTECTOMY Right 05/22/2017   Procedure: TOTAL MASTECTOMY;  Surgeon: Florene Glen, MD;  Location: [facility];  Service: General;  Laterality: Right;   TOTAL MASTECTOMY Left 02/09/2018   Procedure: LEFT PROPHYLACTIC  MASTECTOMY;  Surgeon: Jovita Kussmaul, MD;  Location: [facility];  Service: General;  Laterality: Left;   TUBAL LIGATION  2004   ULNAR NERVE TRANSPOSITION Left 01/29/2019   Procedure: LEFT ULNAR NERVE DECOMPRESSION/TRANSPOSITION;  Surgeon: Leanora Cover, MD;  Location: [place];  Service: Orthopedics;  Laterality: Left;   Patient Active Problem List   Diagnosis Date Noted   Pain in left knee 11/29/2021   Genetic testing 11/02/2021   Elevated blood pressure reading in office with white coat syndrome, with diagnosis of hypertension 08/08/2021   Limited joint range of motion (ROM) 07/19/2021   Class 3 severe obesity due to excess calories with body mass index (BMI) of 45.0 to 49.9 in adult ([place]) 06/25/2021   Prediabetes 06/21/2021   Elevated ferritin level 06/21/2021   OSA on CPAP 04/20/2021   Migraine headache without aura 07/21/2020   Ulnar neuropathy 08/28/2018   S/P breast reconstruction, bilateral 06/16/2018   Acquired absence of right breast 06/01/2018   S/P mastectomy, bilateral 02/17/2018   Acquired absence of left breast 02/09/2018   Acquired absence of breast and absent nipple, right 01/02/2018   Postoperative breast asymmetry 01/02/2018   Intractable nausea and vomiting 08/11/2017   Breast cancer ([place]) 05/22/2017   Morbid (severe) obesity due to excess calories ([place]) 04/15/2017   Microcytic anemia 04/15/2017   GERD (gastroesophageal reflux disease) 04/15/2017   Chest wall mass    Malignant neoplasm of upper-outer quadrant of right female breast ([place]) 05/25/2015   HTN (hypertension) 05/24/2015      PCP: Donnie Coffin,  MD REFERRING PROVIDER: Esaw Grandchild, NP   REFERRING DIAG: M70.62 (ICD-10-CM) - Trochanteric bursitis of left hip   THERAPY DIAG:  Pain in right hip   Pain in left hip   Difficulty in walking, not elsewhere classified   Muscle weakness (generalized)   Rationale for Evaluation and Treatment Rehabilitation   PERTINENT HISTORY: Patient is a 51 year old female with complaint of bilateral hip pain, more pain on R at this time due to Hx of injection in L hip. Pt had greater trochanteric injection on her L side on 09/24/21 - this has helped with tolerance of walking. Patient reports moderate referral to her R lateral thigh proximally. Pt denies paresthesias or numbness in lower body. Pt reports pain along posterolateral gluteal region to greater trochanter R>L side. Pt reports that symptoms began about 4-6 months ago and has progressively worsened. Pt reports fall last year without Fx or major injury ("lot of soreness"). Pt reports intermittent disturbed sleep with turning on her side. Patient reports no bowel/bladder changes. Patient reports hx of sciatica when  she was pregnant, though not in recent years. Pt reports numbness with prolonged sitting LLE. Hx of varicose veins.      PRECAUTIONS: None   OBJECTIVE: (objective measures completed at initial evaluation unless otherwise dated)     DIAGNOSTIC FINDINGS:  CT Scan Chest, abomen, pelvis: negative for metastatic disease, mild degenerative change of bilateral hips.   DXA Bone density scan: T-score of 0.7     PATIENT SURVEYS:  FOTO 47 , predicted goal score of 57   COGNITION:           Overall cognitive status: Within functional limits for tasks assessed                          SENSATION: WFL   POSTURE: Overpronation bilat feet, genu valgum   PALPATION: Tenderness to palpation along R>L greater trochanter, gluteus medius 10/23/21: Tenderness to palpation along Right patellar tendon, LCL, MCL, lateral>medial joint line,  medial/lateral hamstrings; Left medial joint line, MCL, medial/lateral hamstrings.     MUSCLE LENGTH (10/23/21) Hamstring 90/90: R WFL, L WFL Ely's Test: R Positive (90 deg knee flexion in prone), L Positive (90 deg knee flexion in prone) Gastrocnemius: R Poor (DF to neutral with knee extended), L Poor (DF to neutral with knee extended)   LOWER EXTREMITY ROM:   Lumbar spine AROM WNL with exception of significant trunk rotation motion loss R and L; back pain with extension, hip pain with trunk rotation in either direction                                Knee/ankle updated 10/23/21 Active ROM Right eval Left eval  Hip flexion 95 95  Hip extension      Hip abduction 20* 40*  Hip adduction      Hip internal rotation 28 30  Hip external rotation 25 22  Knee flexion  100 deg  101 deg  Knee extension  -2 -2   Ankle dorsiflexion To neutral  To neutral   Ankle plantarflexion      Ankle inversion      Ankle eversion       (Blank rows = not tested) Knee and ankle values taken 10/23/21   *Pain with flexion overpressure bilaterally, pain with extension overpressure on L only     LOWER EXTREMITY MMT:   MMT Right eval Left eval Right 11/14/21 Left 11/14/21  Hip flexion _0 Hip extension          Hip abduction 4-* 4 4+ 4-*  Hip adduction _1 Hip internal rotation 4+ 5 4+ 5  Hip external rotation 4+ 4+ 5 4+  Knee flexion 5 4+ (updated 10/23/21) 5 4+  Knee extension 5 5 4+ 5  Ankle dorsiflexion          Ankle plantarflexion          Ankle inversion          Ankle eversion           (Blank rows = not tested)   LOWER EXTREMITY SPECIAL TESTS:  Hip special tests: Saralyn Pilar (FABER) test: positive  and Hip scouring test: positive , Straight leg raise: Negative bilaterally   10/23/21: McMurray's test: Positive bilaterally      GAIT: Comments: mild compensated Trendelenburg R>L           TODAY'S TREATMENT:   SUBJECTIVE: Patient  reports having notable pain over the  weekend and having to use walker due to L leg pain. Pt repots primary area of pain along L lateral leg/lateral calf. Previous negative DVT screen/ultrasound. Pt is unable to fully extend her L knee. Patient reports she is notably limited with stair descent. Patient reports 7/10 pain at arrival to PT - pt has not taken pain medicine. Pt reports clicking/popping in her L leg - she feels it is "in my bones." Pt reports remaining pain in her lateral hips and mild soreness remaining in L knee s/p injection. Patient reports no R knee pain this AM.      PAIN:  Are you having pain? Yes, 7/10 pain, L leg, lateral calf         Manual Therapy - for symptom modulation, soft tissue sensitivity and mobility, joint mobility, ROM                 STM and IASTM with Theraband roller along L calf and L hamstrings, emphasis on lateral mm (lateral gastroc/biceps femoris); x 10 minutes                DTM/TPR L gluteus medius x 5 minutes   Passive hip adduction cross-body stretch in supine, 1x60sec                                           *not today*   IASTM with Hypervolt along L gluteus medius; x 10 minutes Attempted unilateral L and bilateral lower extremity distraction - stopped due to irritation of LE symptoms           Manual quadriceps stretch in prone; x 60 sec bilaterally              Mulligan belt lateral distraction in hooklying and mobilization with movement with conjunct hip ER, IR, and flexion; x 3 minutes bilaterally              Bilateral STM and IASTM with Hypervolt along gluteus medius and minimus; x 3 minutes on each side         Therapeutic Exercise - for improved soft tissue flexibility and extensibility as needed for ROM, improved strength as needed to improve performance of CKC activities/functional movements; progressive loading of affected tissues to promote tissue remodeling and healing    NuStep; 5 minutes, Level 2, seat at 11, arms at 9 - subjective information gathered during  this time      PATIENT EDUCATION: Discussed potential use of dry needling to reduce referred pain pattern stemming from L hip. Discussed use of leg prop stretch for passive knee extension as tolerated and use of cryotherapy to reduce pain/swelling at home. Discussed typical expectations s/p steroid injection. Recommended follow-up with orthopedic clinic for further advisement if symptoms are not improving this week s/p injections.        *hold today* Lateral step up; 1x10 on each LE, 6-inch step, staircase in center of gym  Minisquat; 2x10, hands hovering along bilateral handrails, PT guarding posteriorly for pt safety      *not today* Supine piriformis stretch; 2x30 sec, LLE today Seated sciatic nerve flossing; 2x10, focus on LLE today  Sidelying clamshell; 2x10, bilateral Supine glute bridge; 2x10, with Blue Tband looped around BLE superior to knees Standing gastrocemius stretch, lunge at wall; 2x30 sec bilateral Prone Quad stretch; 2x30 sec, bilat Hip abductor and adductor  isometrics; x10, 10 sec each direction Supine gastrocnemius stretch    Cold pack (unbilled) - for anti-inflammatory and analgesic effect as needed for reduced pain and improved ability to participate in HEP, along L knee/proximal leg in supine with L ankle propped on pillows, roll behind bend of knee due to intolerance of knee hanging in this position x 10 minutes       PATIENT EDUCATION:  Education details:  see above for patient education details   Person educated: Patient Education method: Explanation, Demonstration, and Handouts Education comprehension: verbalized understanding and returned demonstration     HOME EXERCISE PROGRAM: Access Code [email or web address]   ASSESSMENT:   CLINICAL IMPRESSION: Patient has minimal anterior knee pain at this time. She does have lateral hip pain remaining bilaterally, but pt has primary complaint of L leg pain that has been severe over this past weekend. Pt is s/p steroid  injection to L knee (anterolateral approach). Pt presents with decreased terminal knee extension, antalgic pattern with dec stance time on LLE, and heavy reliance on SPC. Discussed with pt expected soreness and temporary discomfort following injections, though  symptoms are expected to improve over the first 1-2 weeks following her injection. Pt unfortunately has difficulty participating in exercise today given her current condition and intensity of leg pain; pt does exhibit notable distress related to current condition. Concordant pain through full length of L lower limb is reproduced with deep hip ER palpation with TrP/DN treatment suggested; pt chooses to hold on this until next visit.  Patient has remaining deficits in bilateral hip and knee ROM, gluteal/hip and hamstrings strength, TrP deep hip ERs, gait changes, local nociceptive lateral hip pain and anterior knee pain with recent onset of LLE calf/lateral leg pain and paresthesias. Patient will benefit from continued skilled therapeutic intervention to address the above deficits as needed for improved function and QoL.      OBJECTIVE IMPAIRMENTS Abnormal gait, difficulty walking, decreased ROM, decreased strength, hypomobility, and pain.    ACTIVITY LIMITATIONS sitting, standing, squatting, sleeping, stairs, transfers, bed mobility, and locomotion level   PARTICIPATION LIMITATIONS: shopping and community activity   PERSONAL FACTORS Time since onset of injury/illness/exacerbation and 3+ comorbidities: (obesity, HTN, depression, Hx breast cancer, neuropathy)  are also affecting patient's functional outcome.    REHAB POTENTIAL: Good   CLINICAL DECISION MAKING: Evolving/moderate complexity   EVALUATION COMPLEXITY: Moderate     GOALS:   SHORT TERM GOALS: Target date: 11/07/2021  Patient will be independent and compliant with HEP as needed for symptom modulation, mobility, and strength gains to augment in-clinic PT intervention and improve pt  function  Baseline: 10/16/21: Baseline HEP initiated.  11/14/21: Excellent HEP compliance and pt verbalizes her exercises well.  Goal status: ACHIEVED     LONG TERM GOALS: Target date: 11/29/2021    Patient will demonstrate improved function as evidenced by a score of 57 on FOTO measure for full participation in activities at home and in the community. Baseline: 10/16/21 47.    11/14/21: 59/57.  Goal status: ACHIEVED   2.  Patient will report NPRS no greater than 2-3/10 with daily activities as needed for improved ability to perform self-care, access community, and participate in desired life roles  Baseline: 10/16/21: NPRS 6-7/10 at worst.   11/14/21: 5/10 at worst.  Goal status: NOT MET   3.  Patient will have MMT 5/5 for all tested hip motions indicative of improved strength as needed for improved tolerance to loading affected tissues during prolonged weightbearing and closed-chain  work Baseline: 10/16/21: MMT R/L Hip abduction 4-/4, Hip IR 4+/5, Hip ER 4+/4+.    11/14/21: MMT R/L Hip abduction 4+/4-, Hip IR 4+/5, Hip ER 5/4+. Goal status: NOT MET   4.  Patient will sleep through the night without disturbed sleep secondary to hip pain on either side as needed for improved sleep quality and long-term wellness Baseline: 10/16/21: Intermittent disturbed sleep when rolling onto R more so than L side.    11/14/21: about one wake-up during the night due to pain.  Goal status: IN PROGRESS   5.  Patient will complete errands in town including ambulating around grocery store/shopping center without increase in pain > 2/10 indicative of community-level mobility without increase in hip symptoms Baseline: 10/16/21: Notable pain with walking, pain worsens with higher volume of ambulatory activity.  11/14/21: Notable pain in knees with this, mild hip pain with completion of errands in community.  Goal status: PARTIALLY MET        PLAN: PT FREQUENCY: 2x/week   PT DURATION: 6 weeks   PLANNED INTERVENTIONS:  Therapeutic exercises, Therapeutic activity, Neuromuscular re-education, Balance training, Gait training, Patient/Family education, Self Care, Joint mobilization, Dry Needling, Electrical stimulation, Cryotherapy, Moist heat, and Manual therapy   PLAN FOR NEXT SESSION: Manual therapy for hip mobility, quad and hamstrings soft tissue mobility, and desensitization; continued stretching and icing for patient's HEP. Focus on symptom modulation, gentle mobility work, and restoration of ROM at this time given recent flare-up. Follow-up on LLE pain flare-up and continued workup with orthopedic group.        Valentina Gu, PT, DPT #[id number]  Eilleen Kempf, PT 12/05/2021, 8:13 AM

## 2021-12-05 DIAGNOSIS — M79605 Pain in left leg: Secondary | ICD-10-CM | POA: Insufficient documentation

## 2021-12-06 ENCOUNTER — Encounter: Payer: Self-pay | Admitting: Physical Therapy

## 2021-12-06 ENCOUNTER — Ambulatory Visit: Payer: Medicare Other | Admitting: Physical Therapy

## 2021-12-06 DIAGNOSIS — M25552 Pain in left hip: Secondary | ICD-10-CM

## 2021-12-06 DIAGNOSIS — M25551 Pain in right hip: Secondary | ICD-10-CM

## 2021-12-06 DIAGNOSIS — R262 Difficulty in walking, not elsewhere classified: Secondary | ICD-10-CM

## 2021-12-06 DIAGNOSIS — M6281 Muscle weakness (generalized): Secondary | ICD-10-CM

## 2021-12-06 NOTE — Therapy (Unsigned)
OUTPATIENT PHYSICAL THERAPY TREATMENT NOTE   Patient Name: Michelle Mcmillan MRN: [id number] DOB:May 08, 1970, 51 y.o., female Today's Date: 12/06/2021  PCP: Marland Kitchen REFERRING PROVIDER: ***  END OF SESSION:   PT End of Session - 12/06/21 1001     Visit Number 14    Number of Visits 18    Date for PT Re-Evaluation 12/20/21    Authorization Type UHC Medicare/Medicaid, VL based on medical necessity    PT Start Time 0952    PT Stop Time 1032    PT Time Calculation (min) 40 min    Activity Tolerance Patient tolerated treatment well    Behavior During Therapy [facility] for tasks assessed/performed             Past Medical History:  Diagnosis Date   Anemia    H/O   Anemia    Arthritis    RIGHT KNEE   Back pain    Breast cancer ([place]) 2017   right breast   Cancer ([place])    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)   Carpal tunnel syndrome    Constipation    Depression    Dyspnea    GERD (gastroesophageal reflux disease)    NO MEDS   Headache    migraines   Hypertension    Joint pain    Last menstrual period (LMP) > 10 days ago 08/2015   Lower extremity edema    Migraines    Neuropathy    OSA (obstructive sleep apnea)    Personal history of radiation therapy    Prediabetes    SOB (shortness of breath)    Past Surgical History:  Procedure Laterality Date   BREAST BIOPSY Right 05/08/2017   invasive mamm. ca   BREAST EXCISIONAL BIOPSY Right 06/15/15   breast cancer   BREAST LUMPECTOMY WITH NEEDLE LOCALIZATION Right 06/15/2015   Procedure: BREAST LUMPECTOMY WITH NEEDLE LOCALIZATION;  Surgeon: Hubbard Robinson, MD;  Location: [facility];  Service: General;  Laterality: Right;   BREAST RECONSTRUCTION WITH PLACEMENT OF TISSUE EXPANDER AND FLEX HD (ACELLULAR HYDRATED DERMIS) Left 02/09/2018   Procedure: BREAST RECONSTRUCTION WITH PLACEMENT OF TISSUE EXPANDER AND FLEX HD (ACELLULAR HYDRATED DERMIS);  Surgeon: Wallace Going, DO;  Location: [facility];  Service: Plastics;  Laterality: Left;    BREAST SURGERY     CESAREAN SECTION  1988   x1   COLONOSCOPY WITH PROPOFOL N/A 01/23/2017   Procedure: COLONOSCOPY WITH PROPOFOL;  Surgeon: Lin Landsman, MD;  Location: [facility] ENDOSCOPY;  Service: Gastroenterology;  Laterality: N/A;   ESOPHAGOGASTRODUODENOSCOPY (EGD) WITH PROPOFOL N/A 01/23/2017   Procedure: ESOPHAGOGASTRODUODENOSCOPY (EGD) WITH PROPOFOL;  Surgeon: Lin Landsman, MD;  Location: [facility] ENDOSCOPY;  Service: Gastroenterology;  Laterality: N/A;   LATISSIMUS FLAP TO BREAST Right 06/01/2018   Procedure: RIGHT BREAST RECONSTRUCTION WITH LATISSIMUS MYOCUTANEOUS FLAP;  Surgeon: Wallace Going, DO;  Location: [place];  Service: Plastics;  Laterality: Right;   LIPOSUCTION WITH LIPOFILLING Bilateral 10/15/2018   Procedure: LIPOSUCTION TO BILATERAL BREASTS;  Surgeon: Wallace Going, DO;  Location: [place];  Service: Plastics;  Laterality: Bilateral;   LIPOSUCTION WITH LIPOFILLING Bilateral 04/22/2019   Procedure: fat grafting/liposuction and lipofilling bilateral breasts;  Surgeon: Wallace Going, DO;  Location: [place];  Service: Plastics;  Laterality: Bilateral;   MASS EXCISION N/A 05/20/2016   Procedure: EXCISION CHEST WALL MASS;  Surgeon: Florene Glen, MD;  Location: [facility];  Service: General;  Laterality: N/A;   RECONSTRUCTION BREAST W/ LATISSIMUS DORSI FLAP Right  06/01/2018   REMOVAL OF BILATERAL TISSUE EXPANDERS WITH PLACEMENT OF BILATERAL BREAST IMPLANTS Bilateral 10/15/2018   Procedure: REMOVAL OF BILATERAL TISSUE EXPANDERS WITH PLACEMENT OF BILATERAL BREAST IMPLANTS;  Surgeon: Wallace Going, DO;  Location: [place];  Service: Plastics;  Laterality: Bilateral;   SCAR REVISION Bilateral 04/22/2019   Procedure: Excision of bilateral scar contracture;  Surgeon: Wallace Going, DO;  Location: [place];  Service: Plastics;  Laterality: Bilateral;  total case 90 min   SENTINEL NODE  BIOPSY Right 06/15/2015   Procedure: SENTINEL NODE BIOPSY;  Surgeon: Hubbard Robinson, MD;  Location: [facility];  Service: General;  Laterality: Right;   TOTAL MASTECTOMY Right 05/22/2017   Procedure: TOTAL MASTECTOMY;  Surgeon: Florene Glen, MD;  Location: [facility];  Service: General;  Laterality: Right;   TOTAL MASTECTOMY Left 02/09/2018   Procedure: LEFT PROPHYLACTIC  MASTECTOMY;  Surgeon: Jovita Kussmaul, MD;  Location: [facility];  Service: General;  Laterality: Left;   TUBAL LIGATION  2004   ULNAR NERVE TRANSPOSITION Left 01/29/2019   Procedure: LEFT ULNAR NERVE DECOMPRESSION/TRANSPOSITION;  Surgeon: Leanora Cover, MD;  Location: [place];  Service: Orthopedics;  Laterality: Left;   Patient Active Problem List   Diagnosis Date Noted   Pain in left knee 11/29/2021   Genetic testing 11/02/2021   Elevated blood pressure reading in office with white coat syndrome, with diagnosis of hypertension 08/08/2021   Limited joint range of motion (ROM) 07/19/2021   Class 3 severe obesity due to excess calories with body mass index (BMI) of 45.0 to 49.9 in adult ([place]) 06/25/2021   Prediabetes 06/21/2021   Elevated ferritin level 06/21/2021   OSA on CPAP 04/20/2021   Migraine headache without aura 07/21/2020   Ulnar neuropathy 08/28/2018   S/P breast reconstruction, bilateral 06/16/2018   Acquired absence of right breast 06/01/2018   S/P mastectomy, bilateral 02/17/2018   Acquired absence of left breast 02/09/2018   Acquired absence of breast and absent nipple, right 01/02/2018   Postoperative breast asymmetry 01/02/2018   Intractable nausea and vomiting 08/11/2017   Breast cancer ([place]) 05/22/2017   Morbid (severe) obesity due to excess calories ([place]) 04/15/2017   Microcytic anemia 04/15/2017   GERD (gastroesophageal reflux disease) 04/15/2017   Chest wall mass    Malignant neoplasm of upper-outer quadrant of right female breast ([place]) 05/25/2015   HTN (hypertension)  05/24/2015    REFERRING DIAG: ***  THERAPY DIAG:  Pain in right hip  Pain in left hip  Difficulty in walking, not elsewhere classified  Muscle weakness (generalized)  Rationale for Evaluation and Treatment {HABREHAB:27488}  PERTINENT HISTORY: ***  PRECAUTIONS: ***  SUBJECTIVE: ***  PAIN:  Are you having pain? {OPRCPAIN:27236}   OBJECTIVE: (objective measures completed at initial evaluation unless otherwise dated)   (Copy Eval's Objective through Plan section here)   Eilleen Kempf, PT 12/06/2021, 10:33 AM

## 2021-12-06 NOTE — Patient Instructions (Signed)
PCP: Emogene Morgan, MD REFERRING PROVIDER: Julaine Fusi, NP   REFERRING DIAG: M70.62 (ICD-10-CM) - Trochanteric bursitis of left hip   THERAPY DIAG:  Pain in right hip   Pain in left hip   Difficulty in walking, not elsewhere classified   Muscle weakness (generalized)   Rationale for Evaluation and Treatment Rehabilitation   PERTINENT HISTORY: Patient is a 51 year old female with complaint of bilateral hip pain, more pain on R at this time due to Hx of injection in L hip. Pt had greater trochanteric injection on her L side on 09/24/21 - this has helped with tolerance of walking. Patient reports moderate referral to her R lateral thigh proximally. Pt denies paresthesias or numbness in lower body. Pt reports pain along posterolateral gluteal region to greater trochanter R>L side. Pt reports that symptoms began about 4-6 months ago and has progressively worsened. Pt reports fall last year without Fx or major injury ("lot of soreness"). Pt reports intermittent disturbed sleep with turning on her side. Patient reports no bowel/bladder changes. Patient reports hx of sciatica when she was pregnant, though not in recent years. Pt reports numbness with prolonged sitting LLE. Hx of varicose veins.      PRECAUTIONS: None   OBJECTIVE: (objective measures completed at initial evaluation unless otherwise dated)     DIAGNOSTIC FINDINGS:  CT Scan Chest, abomen, pelvis: negative for metastatic disease, mild degenerative change of bilateral hips.   DXA Bone density scan: T-score of 0.7     PATIENT SURVEYS:  FOTO 47 , predicted goal score of 57   COGNITION:           Overall cognitive status: Within functional limits for tasks assessed                          SENSATION: WFL   POSTURE: Overpronation bilat feet, genu valgum   PALPATION: Tenderness to palpation along R>L greater trochanter, gluteus medius 10/23/21: Tenderness to palpation along Right patellar tendon, LCL, MCL,  lateral>medial joint line, medial/lateral hamstrings; Left medial joint line, MCL, medial/lateral hamstrings.     MUSCLE LENGTH (10/23/21) Hamstring 90/90: R WFL, L WFL Ely's Test: R Positive (90 deg knee flexion in prone), L Positive (90 deg knee flexion in prone) Gastrocnemius: R Poor (DF to neutral with knee extended), L Poor (DF to neutral with knee extended)   LOWER EXTREMITY ROM:   Lumbar spine AROM WNL with exception of significant trunk rotation motion loss R and L; back pain with extension, hip pain with trunk rotation in either direction                                Knee/ankle updated 10/23/21 Active ROM Right eval Left eval  Hip flexion 95 95  Hip extension      Hip abduction 20* 40*  Hip adduction      Hip internal rotation 28 30  Hip external rotation 25 22  Knee flexion  100 deg  101 deg  Knee extension  -2 -2   Ankle dorsiflexion To neutral  To neutral   Ankle plantarflexion      Ankle inversion      Ankle eversion       (Blank rows = not tested) Knee and ankle values taken 10/23/21   *Pain with flexion overpressure bilaterally, pain with extension overpressure on L only     LOWER EXTREMITY  MMT:   MMT Right eval Left eval Right 11/14/21 Left 11/14/21  Hip flexion 5 5 5 5   Hip extension          Hip abduction 4-* 4 4+ 4-*  Hip adduction 5 5 5 5   Hip internal rotation 4+ 5 4+ 5  Hip external rotation 4+ 4+ 5 4+  Knee flexion 5 4+ (updated 10/23/21) 5 4+  Knee extension 5 5 4+ 5  Ankle dorsiflexion          Ankle plantarflexion          Ankle inversion          Ankle eversion           (Blank rows = not tested)   LOWER EXTREMITY SPECIAL TESTS:  Hip special tests: Luisa Hart [facility]) test: positive  and Hip scouring test: positive , Straight leg raise: Negative bilaterally   10/23/21: McMurray's test: Positive bilaterally      GAIT: Comments: mild compensated Trendelenburg R>L            TODAY'S TREATMENT:   SUBJECTIVE: Patient reports having  notable pain over the weekend and having to use walker due to L leg pain. Pt repots primary area of pain along L lateral leg/lateral calf. Previous negative DVT screen/ultrasound. Pt is unable to fully extend her L knee. Patient reports she is notably limited with stair descent. Patient reports 7/10 pain at arrival to PT - pt has not taken pain medicine. Pt reports clicking/popping in her L leg - she feels it is "in my bones." Pt reports remaining pain in her lateral hips and mild soreness remaining in L knee s/p injection. Patient reports no R knee pain this AM.      PAIN:  Are you having pain? Yes, 7/10 pain, L leg, lateral calf         Manual Therapy - for symptom modulation, soft tissue sensitivity and mobility, joint mobility, ROM                 STM and IASTM with Theraband roller along L calf and L hamstrings, emphasis on lateral mm (lateral gastroc/biceps femoris); x 10 minutes                DTM/TPR L gluteus medius x 5 minutes               Passive hip adduction cross-body stretch in supine, 1x60sec                                           *not today*              IASTM with Hypervolt along L gluteus medius; x 10 minutes Attempted unilateral L and bilateral lower extremity distraction - stopped due to irritation of LE symptoms           Manual quadriceps stretch in prone; x 60 sec bilaterally              Mulligan belt lateral distraction in hooklying and mobilization with movement with conjunct hip ER, IR, and flexion; x 3 minutes bilaterally              Bilateral STM and IASTM with Hypervolt along gluteus medius and minimus; x 3 minutes on each side         Therapeutic Exercise - for improved soft tissue flexibility and extensibility  as needed for ROM, improved strength as needed to improve performance of CKC activities/functional movements; progressive loading of affected tissues to promote tissue remodeling and healing     NuStep; 5 minutes, Level 2, seat at 11, arms  at 9 - subjective information gathered during this time       PATIENT EDUCATION: Discussed potential use of dry needling to reduce referred pain pattern stemming from L hip. Discussed use of leg prop stretch for passive knee extension as tolerated and use of cryotherapy to reduce pain/swelling at home. Discussed typical expectations s/p steroid injection. Recommended follow-up with orthopedic clinic for further advisement if symptoms are not improving this week s/p injections.        *hold today* Lateral step up; 1x10 on each LE, 6-inch step, staircase in center of gym  Minisquat; 2x10, hands hovering along bilateral handrails, PT guarding posteriorly for pt safety      *not today* Supine piriformis stretch; 2x30 sec, LLE today Seated sciatic nerve flossing; 2x10, focus on LLE today  Sidelying clamshell; 2x10, bilateral Supine glute bridge; 2x10, with Blue Tband looped around BLE superior to knees Standing gastrocemius stretch, lunge at wall; 2x30 sec bilateral Prone Quad stretch; 2x30 sec, bilat Hip abductor and adductor isometrics; x10, 10 sec each direction Supine gastrocnemius stretch       Cold pack (unbilled) - for anti-inflammatory and analgesic effect as needed for reduced pain and improved ability to participate in HEP, along L knee/proximal leg in supine with L ankle propped on pillows, roll behind bend of knee due to intolerance of knee hanging in this position x 10 minutes         PATIENT EDUCATION:  Education details:  see above for patient education details   Person educated: Patient Education method: Explanation, Demonstration, and Handouts Education comprehension: verbalized understanding and returned demonstration     HOME EXERCISE PROGRAM: Access Code [email or web address]   ASSESSMENT:   CLINICAL IMPRESSION: Patient has minimal anterior knee pain at this time. She does have lateral hip pain remaining bilaterally, but pt has primary complaint of L leg pain that has been  severe over this past weekend. Pt is s/p steroid injection to L knee (anterolateral approach). Pt presents with decreased terminal knee extension, antalgic pattern with dec stance time on LLE, and heavy reliance on SPC. Discussed with pt expected soreness and temporary discomfort following injections, though  symptoms are expected to improve over the first 1-2 weeks following her injection. Pt unfortunately has difficulty participating in exercise today given her current condition and intensity of leg pain; pt does exhibit notable distress related to current condition. Concordant pain through full length of L lower limb is reproduced with deep hip ER palpation with TrP/DN treatment suggested; pt chooses to hold on this until next visit.  Patient has remaining deficits in bilateral hip and knee ROM, gluteal/hip and hamstrings strength, TrP deep hip ERs, gait changes, local nociceptive lateral hip pain and anterior knee pain with recent onset of LLE calf/lateral leg pain and paresthesias. Patient will benefit from continued skilled therapeutic intervention to address the above deficits as needed for improved function and QoL.      OBJECTIVE IMPAIRMENTS Abnormal gait, difficulty walking, decreased ROM, decreased strength, hypomobility, and pain.    ACTIVITY LIMITATIONS sitting, standing, squatting, sleeping, stairs, transfers, bed mobility, and locomotion level   PARTICIPATION LIMITATIONS: shopping and community activity   PERSONAL FACTORS Time since onset of injury/illness/exacerbation and 3+ comorbidities: (obesity, HTN, depression, Hx breast cancer, neuropathy)  are also affecting patient's functional outcome.    REHAB POTENTIAL: Good   CLINICAL DECISION MAKING: Evolving/moderate complexity   EVALUATION COMPLEXITY: Moderate     GOALS:   SHORT TERM GOALS: Target date: 11/07/2021  Patient will be independent and compliant with HEP as needed for symptom modulation, mobility, and strength gains to  augment in-clinic PT intervention and improve pt function  Baseline: 10/16/21: Baseline HEP initiated.  11/14/21: Excellent HEP compliance and pt verbalizes her exercises well.  Goal status: ACHIEVED     LONG TERM GOALS: Target date: 11/29/2021    Patient will demonstrate improved function as evidenced by a score of 57 on FOTO measure for full participation in activities at home and in the community. Baseline: 10/16/21 47.    11/14/21: 59/57.  Goal status: ACHIEVED   2.  Patient will report NPRS no greater than 2-3/10 with daily activities as needed for improved ability to perform self-care, access community, and participate in desired life roles  Baseline: 10/16/21: NPRS 6-7/10 at worst.   11/14/21: 5/10 at worst.  Goal status: NOT MET   3.  Patient will have MMT 5/5 for all tested hip motions indicative of improved strength as needed for improved tolerance to loading affected tissues during prolonged weightbearing and closed-chain work Baseline: 10/16/21: MMT R/L Hip abduction 4-/4, Hip IR 4+/5, Hip ER 4+/4+.    11/14/21: MMT R/L Hip abduction 4+/4-, Hip IR 4+/5, Hip ER 5/4+. Goal status: NOT MET   4.  Patient will sleep through the night without disturbed sleep secondary to hip pain on either side as needed for improved sleep quality and long-term wellness Baseline: 10/16/21: Intermittent disturbed sleep when rolling onto R more so than L side.    11/14/21: about one wake-up during the night due to pain.  Goal status: IN PROGRESS   5.  Patient will complete errands in town including ambulating around grocery store/shopping center without increase in pain > 2/10 indicative of community-level mobility without increase in hip symptoms Baseline: 10/16/21: Notable pain with walking, pain worsens with higher volume of ambulatory activity.  11/14/21: Notable pain in knees with this, mild hip pain with completion of errands in community.  Goal status: PARTIALLY MET        PLAN: PT FREQUENCY: 2x/week    PT DURATION: 6 weeks   PLANNED INTERVENTIONS: Therapeutic exercises, Therapeutic activity, Neuromuscular re-education, Balance training, Gait training, Patient/Family education, Self Care, Joint mobilization, Dry Needling, Electrical stimulation, Cryotherapy, Moist heat, and Manual therapy   PLAN FOR NEXT SESSION: Manual therapy for hip mobility, quad and hamstrings soft tissue mobility, and desensitization; continued stretching and icing for patient's HEP. Focus on symptom modulation, gentle mobility work, and restoration of ROM at this time given recent flare-up. Follow-up on LLE pain flare-up and continued workup with orthopedic group.

## 2021-12-08 NOTE — Progress Notes (Unsigned)
Chief Complaint:   OBESITY Rudi is here to discuss her progress with her obesity treatment plan along with follow-up of her obesity related diagnoses. Christel is on the Category 3 Plan and the [place] plus 300 calories and states she is following her eating plan approximately 80% of the time. Erinne states she is not currently exercising.  Today's visit was #: 10 Starting weight: 333 lbs Starting date: 06/20/2021 Today's weight: 315 lbs Today's date: 11/28/2021 Total lbs lost to date: 18 Total lbs lost since last in-office visit: +3  Interim History: Pt was evaluated at the ER for left lower extremity pain on 11/19/2021. Notes reviewed. Pt still with issues and ising a cane currently. She is unable to exercise.  Subjective:   1. Essential hypertension Pt's checks her BP at home approximately 5 times a week. Her BP runs around 126/82 an dis never over 140/90 or even near that.  2. Prediabetes Ajaya's last A1c was 6.0 with an insulin level of 11.8.  3. Muscle cramps She has left lower extremity pain. No DVT on ultrasound and x-ray unremarkable.  4. Vitamin D deficiency Pt's Vit D level was 45.2 approximately 5 months ago.  5. Other hyperlipidemia Her LDL 5 months ago was 105.  Assessment/Plan:   Orders Placed This Encounter  Procedures   Comprehensive metabolic panel   Hemoglobin A1c   Insulin, random   Lipid Panel With LDL/HDL Ratio   VITAMIN D 25 Hydroxy (Vit-D Deficiency, Fractures)   Vitamin B12   Magnesium    Medications Discontinued During This Encounter  Medication Reason   hydrochlorothiazide (MICROZIDE) 12.5 MG capsule Reorder     Meds ordered this encounter  Medications   hydrochlorothiazide (MICROZIDE) 12.5 MG capsule    Sig: Take 1 capsule (12.5 mg total) by mouth daily.    Dispense:  30 capsule    Refill:  0     1. Essential hypertension BP at goal, especially at home. Jyoti is working on healthy weight loss and exercise to  improve blood pressure control. We will watch for signs of hypotension as she continues her lifestyle modifications.  Refill- hydrochlorothiazide (MICROZIDE) 12.5 MG capsule; Take 1 capsule (12.5 mg total) by mouth daily.  Dispense: 30 capsule; Refill: 0  Check fasting labs at next OV. - Comprehensive metabolic panel  2. Prediabetes Ashaki will continue to work on weight loss, exercise, and decreasing simple carbohydrates to help decrease the risk of diabetes.   Check fasting labs at next OV. - Hemoglobin A1c - Insulin, random  3. Muscle cramps Follow up with PCP and specialists if needed.  Check fasting labs at next OV. - Vitamin B12 - Magnesium  4. Vitamin D deficiency Low Vitamin D level contributes to fatigue and are associated with obesity, breast, and colon cancer. She agrees to continue daily OTC supplement and will follow-up for routine testing of Vitamin D, at least 2-3 times per year to avoid over-replacement.  Check fasting labs at next OV. - VITAMIN D 25 Hydroxy (Vit-D Deficiency, Fractures)  5. Other hyperlipidemia Cardiovascular risk and specific lipid/LDL goals reviewed.  We discussed several lifestyle modifications today and Marquisa will continue to work on diet, exercise and weight loss efforts. Orders and follow up as documented in patient record.   Counseling Intensive lifestyle modifications are the first line treatment for this issue. Dietary changes: Increase soluble fiber. Decrease simple carbohydrates. Exercise changes: Moderate to vigorous-intensity aerobic activity 150 minutes per week if tolerated. Lipid-lowering medications: see documented  in medical record.  Check fasting labs at next OV. - Lipid Panel With LDL/HDL Ratio  6. Obesity, Current BMI is 46. Clydette is currently in the action stage of change. As such, her goal is to continue with weight loss efforts. She has agreed to the Category 3 Plan or the [place] + 300 calories.    Exercise goals:  As is  Behavioral modification strategies: increasing lean protein intake, decreasing simple carbohydrates, and avoiding temptations.  Glayds has agreed to follow-up with our clinic in 3 weeks. She was informed of the importance of frequent follow-up visits to maximize her success with intensive lifestyle modifications for her multiple health conditions.   Objective:   Blood pressure 136/82, pulse 60, temperature 97.8 F (36.6 C), height '5\' 9"'$  (1.753 m), weight (!) 315 lb (142.9 kg), SpO2 98 %. Body mass index is 46.52 kg/m.  General: Cooperative, alert, well developed, in no acute distress. HEENT: Conjunctivae and lids unremarkable. Cardiovascular: Regular rhythm.  Lungs: Normal work of breathing. Neurologic: No focal deficits.   Lab Results  Component Value Date   CREATININE 0.92 11/19/2021   BUN 24 (H) 11/19/2021   NA 140 11/19/2021   K 3.7 11/19/2021   CL 102 11/19/2021   CO2 32 11/19/2021   Lab Results  Component Value Date   ALT 27 09/06/2021   AST 28 09/06/2021   ALKPHOS 95 09/06/2021   BILITOT 0.4 09/06/2021   Lab Results  Component Value Date   HGBA1C 6.0 (H) 06/20/2021   Lab Results  Component Value Date   INSULIN 11.8 06/20/2021   Lab Results  Component Value Date   TSH 2.020 06/20/2021   Lab Results  Component Value Date   CHOL 179 06/20/2021   HDL 65 06/20/2021   LDLCALC 105 (H) 06/20/2021   TRIG 47 06/20/2021   Lab Results  Component Value Date   VD25OH 45.2 06/20/2021   Lab Results  Component Value Date   WBC 6.3 11/19/2021   HGB 11.6 (L) 11/19/2021   HCT 37.6 11/19/2021   MCV 83.9 11/19/2021   PLT 220 11/19/2021   Lab Results  Component Value Date   IRON 49 09/06/2021   TIBC 273 09/06/2021   FERRITIN 224 09/06/2021    Obesity Behavioral Intervention:   Approximately 15 minutes were spent on the discussion below.  ASK: We discussed the diagnosis of obesity with [name] today and Abbigal agreed to give  [place] permission to discuss obesity behavioral modification therapy today.  ASSESS: Rachyl has the diagnosis of obesity and her BMI today is 46.5. Bruce is in the action stage of change.   ADVISE: Theia was educated on the multiple health risks of obesity as well as the benefit of weight loss to improve her health. She was advised of the need for long term treatment and the importance of lifestyle modifications to improve her current health and to decrease her risk of future health problems.  AGREE: Multiple dietary modification options and treatment options were discussed and Angeleen agreed to follow the recommendations documented in the above note.  ARRANGE: Makaylin was educated on the importance of frequent visits to treat obesity as outlined per CMS and USPSTF guidelines and agreed to schedule her next follow up appointment today.  Attestation Statements:   Reviewed by clinician on day of visit: allergies, medications, problem list, medical history, surgical history, family history, social history, and previous encounter notes.  I, Kathlene November, BS, CMA, am acting as transcriptionist for [employer], DO.  I have reviewed the above documentation for accuracy and completeness, and I agree with the above. [name], D.O.  The [place] was signed into law in 2016 which includes the topic of electronic health records.  This provides immediate access to information in MyChart.  This includes consultation notes, operative notes, office notes, lab results and pathology reports.  If you have any questions about what you read please let us know at your next visit so we can discuss your concerns and take corrective action if need be.  We are right here with you.

## 2021-12-11 ENCOUNTER — Other Ambulatory Visit: Payer: Self-pay | Admitting: Family Medicine

## 2021-12-11 ENCOUNTER — Ambulatory Visit: Payer: Medicare Other | Admitting: Physical Therapy

## 2021-12-11 DIAGNOSIS — M79605 Pain in left leg: Secondary | ICD-10-CM

## 2021-12-12 ENCOUNTER — Other Ambulatory Visit: Payer: Self-pay | Admitting: Family Medicine

## 2021-12-12 ENCOUNTER — Ambulatory Visit
Admission: RE | Admit: 2021-12-12 | Discharge: 2021-12-12 | Disposition: A | Discharge status: Home / Self Care | Payer: Medicare Other | Source: Ambulatory Visit | Attending: Family Medicine | Admitting: Family Medicine

## 2021-12-12 DIAGNOSIS — M79605 Pain in left leg: Secondary | ICD-10-CM | POA: Insufficient documentation

## 2021-12-12 MED ORDER — GADOBUTROL 1 MMOL/ML IV SOLN: 10.0000 mL | Freq: Once | INTRAVENOUS | Status: DC | PRN

## 2021-12-13 ENCOUNTER — Encounter: Payer: Medicare Other | Admitting: Physical Therapy

## 2021-12-18 ENCOUNTER — Other Ambulatory Visit: Payer: Self-pay | Admitting: Family Medicine

## 2021-12-18 ENCOUNTER — Other Ambulatory Visit (INDEPENDENT_AMBULATORY_CARE_PROVIDER_SITE_OTHER): Payer: Self-pay | Admitting: Family Medicine

## 2021-12-18 ENCOUNTER — Other Ambulatory Visit: Payer: Medicare Other

## 2021-12-18 DIAGNOSIS — I10 Essential (primary) hypertension: Secondary | ICD-10-CM

## 2021-12-18 DIAGNOSIS — M79605 Pain in left leg: Secondary | ICD-10-CM

## 2021-12-21 ENCOUNTER — Ambulatory Visit
Admission: RE | Admit: 2021-12-21 | Discharge: 2021-12-21 | Disposition: A | Discharge status: Home / Self Care | Payer: Medicare Other | Source: Ambulatory Visit | Attending: Family Medicine | Admitting: Family Medicine

## 2021-12-21 DIAGNOSIS — M79605 Pain in left leg: Secondary | ICD-10-CM | POA: Diagnosis present

## 2021-12-21 MED ORDER — GADOPICLENOL 0.5 MMOL/ML IV SOLN
10.0000 mL | Freq: Once | INTRAVENOUS | Status: AC | PRN
  Administered 2021-12-21: 10 mL via INTRAVENOUS

## 2021-12-27 ENCOUNTER — Encounter: Payer: Self-pay | Admitting: Physician Assistant

## 2021-12-27 ENCOUNTER — Ambulatory Visit: Payer: Medicare Other | Admitting: Physician Assistant

## 2021-12-27 VITALS — Ht 69.0 in

## 2021-12-27 DIAGNOSIS — M1712 Unilateral primary osteoarthritis, left knee: Secondary | ICD-10-CM

## 2021-12-27 MED ORDER — LIDOCAINE HCL 1 % IJ SOLN
3.0000 mL | INTRAMUSCULAR | Status: AC | PRN
  Administered 2021-12-27: 3 mL

## 2021-12-27 MED ORDER — METHYLPREDNISOLONE ACETATE 40 MG/ML IJ SUSP
40.0000 mg | INTRAMUSCULAR | Status: AC | PRN
  Administered 2021-12-27: 40 mg via INTRA_ARTICULAR

## 2021-12-27 NOTE — Progress Notes (Signed)
HPI: [name] comes in today with left knee pain.  She states that pain began about 2-1/2 months ago.  She has been to the ER and has had 2 MRIs of her left lower leg due to the pain.  She describes a sensation of the knee getting caught and once it gets caught is painful until the knee pops and this is painful and then after that her pain decreases.  She states that time she does have some pain down the leg into her left foot.  She does have some back pain also.  She describes no waking back pain, saddle anesthesia like symptoms.  She has not had any decrease in urinary urgency but no bowel incontinence.  She denies any fevers chills.  She did see Bevely Palmer Persons PA-Chere in our office on 11/29/2021 and was given a cortisone injection in the knee she states this did not help at all.  MRI of the left lower extremity with out contrast and with contrast is reviewed and shows no acute fractures.  Moderate tricompartmental arthritis.  Moderate joint effusions bilaterally.  Review of systems: See HPI otherwise negative  Physical exam: General well-developed well-nourished female no acute distress.  Mood and affect appropriate. Psych: Alert and oriented x3 Bilateral lower extremity she has negative straight leg raise bilaterally.  Good range of motion bilateral hips.  Negative for tenderness over the trochanteric region of both hips.  5 out of 5 strength throughout the lower extremities against resistance.  Tenderness along the lateral joint line of the left knee McMurray's is positive on the left.  Left knee positive effusion no abnormal warmth erythema of either knee.  No instability valgus varus stressing of either knee.  Left knee with patellofemoral crepitus with passive range of motion.   Impression: Left knee tricompartmental arthritis with effusion  Plan: Discussed with patient aspirating the knee and repeating the cortisone injection she is nondiabetic.  She is agreeable with this.  We will see  her back in 2 weeks to see what type of response she has to the injection.  She will be mindful of the mechanical symptoms and shooting pain she is having down the leg.  Questions were encouraged and answered at length.        Procedure Note  Patient: Michelle Mcmillan             Date of Birth: [birth year]           MRN: [id number]             Visit Date: 12/27/2021  Procedures: Visit Diagnoses: No diagnosis found.  Large Joint Inj: L knee on 12/27/2021 5:44 PM Indications: pain Details: 22 G 1.5 in needle, superolateral approach  Arthrogram: No  Medications: 3 mL lidocaine 1 %; 40 mg methylPREDNISolone acetate 40 MG/ML Aspirate: 28 mL Outcome: tolerated well, no immediate complications Procedure, treatment alternatives, risks and benefits explained, specific risks discussed. Consent was given by the patient. Immediately prior to procedure a time out was called to verify the correct patient, procedure, equipment, support staff and site/side marked as required. Patient was prepped and draped in the usual sterile fashion.

## 2021-12-31 ENCOUNTER — Encounter (INDEPENDENT_AMBULATORY_CARE_PROVIDER_SITE_OTHER): Payer: Self-pay | Admitting: Family Medicine

## 2021-12-31 ENCOUNTER — Ambulatory Visit (INDEPENDENT_AMBULATORY_CARE_PROVIDER_SITE_OTHER): Payer: Medicare Other | Admitting: Family Medicine

## 2021-12-31 VITALS — BP 124/87 | HR 59 | Temp 98.3°F | Ht 69.0 in | Wt 315.0 lb

## 2021-12-31 DIAGNOSIS — Z6841 Body Mass Index (BMI) 40.0 and over, adult: Secondary | ICD-10-CM

## 2021-12-31 DIAGNOSIS — E669 Obesity, unspecified: Secondary | ICD-10-CM

## 2021-12-31 DIAGNOSIS — I10 Essential (primary) hypertension: Secondary | ICD-10-CM | POA: Diagnosis not present

## 2021-12-31 MED ORDER — HYDROCHLOROTHIAZIDE 12.5 MG PO CAPS: 12.5000 mg | ORAL_CAPSULE | Freq: Every day | ORAL | 0 refills | Status: DC

## 2022-01-01 LAB — COMPREHENSIVE METABOLIC PANEL
ALT: 17 IU/L (ref 0–32)
AST: 24 IU/L (ref 0–40)
Albumin/Globulin Ratio: 1.7 (ref 1.2–2.2)
Albumin: 4.1 g/dL (ref 3.8–4.9)
Alkaline Phosphatase: 89 IU/L (ref 44–121)
BUN/Creatinine Ratio: 28 — ABNORMAL HIGH (ref 9–23)
BUN: 23 mg/dL (ref 6–24)
Bilirubin Total: 0.2 mg/dL (ref 0.0–1.2)
CO2: 26 mmol/L (ref 20–29)
Calcium: 9.6 mg/dL (ref 8.7–10.2)
Chloride: 104 mmol/L (ref 96–106)
Creatinine, Ser: 0.83 mg/dL (ref 0.57–1.00)
Globulin, Total: 2.4 g/dL (ref 1.5–4.5)
Glucose: 86 mg/dL (ref 70–99)
Potassium: 4.3 mmol/L (ref 3.5–5.2)
Sodium: 145 mmol/L — ABNORMAL HIGH (ref 134–144)
Total Protein: 6.5 g/dL (ref 6.0–8.5)
eGFR: 85 mL/min/{1.73_m2} (ref >59)

## 2022-01-01 LAB — LIPID PANEL WITH LDL/HDL RATIO
Cholesterol, Total: 180 mg/dL (ref 100–199)
HDL: 75 mg/dL (ref >39)
LDL Chol Calc (NIH): 94 mg/dL (ref 0–99)
LDL/HDL Ratio: 1.3 ratio (ref 0.0–3.2)
Triglycerides: 55 mg/dL (ref 0–149)
VLDL Cholesterol Cal: 11 mg/dL (ref 5–40)

## 2022-01-01 LAB — HEMOGLOBIN A1C
Est. average glucose Bld gHb Est-mCnc: 126 mg/dL
Hgb A1c MFr Bld: 6 % — ABNORMAL HIGH (ref 4.8–5.6)

## 2022-01-01 LAB — VITAMIN D 25 HYDROXY (VIT D DEFICIENCY, FRACTURES): Vit D, 25-Hydroxy: 48.2 ng/mL (ref 30.0–100.0)

## 2022-01-01 LAB — VITAMIN B12: Vitamin B-12: 593 pg/mL (ref 232–1245)

## 2022-01-01 LAB — INSULIN, RANDOM: INSULIN: 11 u[IU]/mL (ref 2.6–24.9)

## 2022-01-01 LAB — MAGNESIUM: Magnesium: 1.8 mg/dL (ref 1.6–2.3)

## 2022-01-01 NOTE — Progress Notes (Unsigned)
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   INTERVAL HISTORY: Patient was last seen in clinic in September 2021. Patient agreed to video assisted telemedicine visit for her routine 63-monthevaluation.  She currently feels well and at her baseline.  She continues to have occasional hot flashes with letrozole, but otherwise is tolerating treatment well.  She does not complain of peripheral neuropathy or other neurologic complaints.  She denies any pain. She has a good appetite and denies weight loss.  She has no chest pain, shortness of breath, cough, or hemoptysis.  She denies any nausea, vomiting, constipation, or diarrhea.  She has no urinary complaints.  Patient offers further no specific complaints today.   REVIEW OF SYSTEMS:   Review of Systems  Constitutional: Negative.  Negative for diaphoresis, fever, malaise/fatigue and weight loss.  Respiratory: Negative.  Negative for cough and shortness of breath.   Cardiovascular: Negative.  Negative for chest pain and leg swelling.  Gastrointestinal: Negative.  Negative for abdominal pain, constipation, diarrhea, nausea and vomiting.  Genitourinary: Negative.  Negative for dysuria.  Musculoskeletal: Negative.  Negative for joint pain and myalgias.  Skin: Negative.  Negative for rash.  Neurological:  Positive for sensory change. Negative for tingling, focal weakness and weakness.  Psychiatric/Behavioral: Negative.  The  patient is not nervous/anxious and does not have insomnia.     As per HPI. Otherwise, a complete review of systems is negative.  PAST MEDICAL HISTORY: Past Medical History:  Diagnosis Date   Anemia    H/O   Anemia    Arthritis    RIGHT KNEE   Back pain    [place]   [place]   [place]    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)   Carpal tunnel syndrome    Constipation    Depression    Dyspnea    GERD (gastroesophageal reflux disease)    NO MEDS   Headache    migraines   Hypertension    Joint pain    Last menstrual period (LMP) > 10 days ago 08/2015   Lower extremity edema    Migraines    Neuropathy    OSA (obstructive sleep apnea)    Personal history of radiation therapy    Prediabetes    SOB (shortness of breath)     PAST SURGICAL HISTORY: Past Surgical History:  Procedure Laterality Date   BREAST BIOPSY Right 05/08/2017   invasive mamm. ca   BREAST EXCISIONAL BIOPSY Right 06/15/15   breast cancer   BREAST LUMPECTOMY WITH NEEDLE LOCALIZATION Right 06/15/2015   Procedure: BREAST LUMPECTOMY WITH NEEDLE LOCALIZATION;  Surgeon: CHubbard Robinson MD;  Location: [facility];  Service: General;  Laterality: Right;   BREAST RECONSTRUCTION WITH PLACEMENT OF TISSUE EXPANDER AND FLEX HD (ACELLULAR HYDRATED DERMIS) Left 02/09/2018   Procedure: BREAST RECONSTRUCTION WITH PLACEMENT OF TISSUE EXPANDER AND FLEX HD (ACELLULAR HYDRATED DERMIS);  Surgeon: DWallace Going DO;  Location: [facility];  Service: Plastics;  Laterality: Left;   BREAST SURGERY     CESAREAN  SECTION  1988   x1   COLONOSCOPY WITH PROPOFOL N/A 01/23/2017   Procedure: COLONOSCOPY WITH PROPOFOL;  Surgeon: Lin Landsman, MD;  Location: [facility] END[facility]COPY;  Service: Gastroenterology;  Laterality: N/A;   ESOPHAGOGASTRODUODENOSCOPY (EGD) WITH PROPOFOL N/A 01/23/2017   Procedure: ESOPHAGOGASTRODUODENOSCOPY (EGD) WITH PROPOFOL;  Surgeon: Lin Landsman, MD;  Location: [facility] ENDOSCOPY;  Service:  Gastroenterology;  Laterality: N/A;   LATISSIMUS FLAP TO BREAST Right 06/01/2018   Procedure: RIGHT BREAST RECONSTRUCTION WITH LATISSIMUS MYOCUTANEOUS FLAP;  Surgeon: Wallace Going, DO;  Location: [place];  Service: Plastics;  Laterality: Right;   LIPOSUCTION WITH LIPOFILLING Bilateral 10/15/2018   Procedure: LIPOSUCTION TO BILATERAL BREASTS;  Surgeon: Wallace Going, DO;  Location: [place];  Service: Plastics;  Laterality: Bilateral;   LIPOSUCTION WITH LIPOFILLING Bilateral 04/22/2019   Procedure: fat grafting/liposuction and lipofilling bilateral breasts;  Surgeon: Wallace Going, DO;  Location: [place];  Service: Plastics;  Laterality: Bilateral;   MASS EXCISION N/A 05/20/2016   Procedure: EXCISION CHEST WALL MASS;  Surgeon: Florene Glen, MD;  Location: [facility];  Service: General;  Laterality: N/A;   RECONSTRUCTION BREAST W/ LATISSIMUS DORSI FLAP Right 06/01/2018   REMOVAL OF BILATERAL TISSUE EXPANDERS WITH PLACEMENT OF BILATERAL BREAST IMPLANTS Bilateral 10/15/2018   Procedure: REMOVAL OF BILATERAL TISSUE EXPANDERS WITH PLACEMENT OF BILATERAL BREAST IMPLANTS;  Surgeon: Wallace Going, DO;  Location: [place];  Service: Plastics;  Laterality: Bilateral;   SCAR REVISION Bilateral 04/22/2019   Procedure: Excision of bilateral scar contracture;  Surgeon: Wallace Going, DO;  Location: [place];  Service: Plastics;  Laterality: Bilateral;  total case 90 min   SENTINEL NODE BIOPSY Right 06/15/2015   Procedure: SENTINEL NODE BIOPSY;  Surgeon: Hubbard Robinson, MD;  Location: [facility];  Service: General;  Laterality: Right;   TOTAL MASTECTOMY Right 05/22/2017   Procedure: TOTAL MASTECTOMY;  Surgeon: Florene Glen, MD;  Location: [facility];  Service: General;  Laterality: Right;   TOTAL MASTECTOMY Left 02/09/2018   Procedure: LEFT PROPHYLACTIC  MASTECTOMY;  Surgeon: Jovita Kussmaul, MD;  Location: [facility];  Service: General;  Laterality: Left;   TUBAL LIGATION  2004   ULNAR NERVE TRANSPOSITION Left 01/29/2019   Procedure: LEFT ULNAR NERVE DECOMPRESSION/TRANSPOSITION;  Surgeon: Leanora Cover, MD;  Location: [place];  Service: Orthopedics;  Laterality: Left;    FAMILY HISTORY Family History  Problem Relation Age of Onset   Hypertension Mother    Obesity Mother    Hypertension Father    Diabetes Father    Hypertension Sister    Breast cancer Paternal Aunt        dx 46s   Breast cancer Paternal Aunt        dx 48s   Breast cancer Cousin 56       ADVANCED DIRECTIVES:    HEALTH MAINTENANCE: Social History   Tobacco Use   Smoking status: Never   Smokeless tobacco: Never  Vaping Use   Vaping Use: Never used  Substance Use Topics   Alcohol use: Not Currently   Drug use: No    Allergies  Allergen Reactions   Nitroglycerin Other (See Comments)    Headache, n/v.    Current Outpatient Medications  Medication Sig Dispense Refill   APPLE CIDER VINEGAR PO Take by mouth. Two tabs daily     buPROPion (WELLBUTRIN XL) 150 MG 24 hr tablet Take 150 mg by mouth daily.  butalbital-aspirin-caffeine (FIORINAL) 50-325-40 MG capsule Take 1 capsule by mouth as needed for headache.     calcium-vitamin D (OSCAL WITH D) 500-200 MG-UNIT TABS tablet Take 1 tablet by mouth 2 (two) times daily. With meals     diclofenac sodium (VOLTAREN) 1 % GEL Apply 4 g topically 4 (four) times daily.     dorzolamide-timolol (COSOPT) 22.3-6.8 MG/ML ophthalmic solution Place 1 drop into both eyes every morning.     ELDERBERRY PO Take 50 mg by mouth daily.     Ferrous Sulfate (IRON) 325 (65 Fe) MG TABS Take 1 tablet (325 mg total) by mouth 2 (two) times daily. 60 each 2   gabapentin (NEURONTIN) 300 MG capsule TAKE 1 CAPSULE BY MOUTH 3  TIMES DAILY AND MAY TAKE 2  CAPSULES BY MOUTH AT  BEDTIME IF MORE BOTHERSOME  AT NIGHT 450 capsule 0   hydrochlorothiazide (MICROZIDE) 12.5 MG capsule Take 1  capsule (12.5 mg total) by mouth daily. 30 capsule 0   HYDROcodone-acetaminophen (NORCO/VICODIN) 5-325 MG tablet Take 1 tablet by mouth every 6 (six) hours as needed for severe pain. 5 tablet 0   ibuprofen (ADVIL) 800 MG tablet TAKE 1 TABLET BY MOUTH EVERY 8 HOURS WITH FOOD AS NEEDED FOR PAIN     latanoprost (XALATAN) 0.005 % ophthalmic solution Place 1 drop into both eyes at bedtime.     letrozole (FEMARA) 2.5 MG tablet TAKE 1 TABLET BY MOUTH  DAILY 90 tablet 3   Melatonin 5 MG CAPS Take 5 mg by mouth at bedtime.     omeprazole (PRILOSEC) 20 MG capsule Take 1 capsule (20 mg total) by mouth daily. 30 capsule 3   rizatriptan (MAXALT) 10 MG tablet SMARTSIG:1 Tablet(s) By Mouth 1 to 2 Times Daily     No current facility-administered medications for this visit.    OBJECTIVE: There were no vitals filed for this visit.   There is no height or weight on file to calculate BMI.    ECOG FS:0 - Asymptomatic  General: Well-developed, well-nourished, no acute distress. HEENT: Normocephalic. Neuro: Alert, answering all questions appropriately. Cranial nerves grossly intact. Psych: Normal affect.  LAB RESULTS:  Lab Results  Component Value Date   NA 145 (H) 12/31/2021   K 4.3 12/31/2021   CL 104 12/31/2021   CO2 26 12/31/2021   GLUCOSE 86 12/31/2021   BUN 23 12/31/2021   CREATININE 0.83 12/31/2021   CALCIUM 9.6 12/31/2021   PROT 6.5 12/31/2021   ALBUMIN 4.1 12/31/2021   AST 24 12/31/2021   ALT 17 12/31/2021   ALKPHOS 89 12/31/2021   BILITOT 0.2 12/31/2021   GFRNONAA >60 11/19/2021   GFRAA >60 01/25/2019    Lab Results  Component Value Date   WBC 6.3 11/19/2021   NEUTROABS 4.8 09/06/2021   HGB 11.6 (L) 11/19/2021   HCT 37.6 11/19/2021   MCV 83.9 11/19/2021   PLT 220 11/19/2021     STUDIES: MR TIBIA FIBULA LEFT W CONTRAST  Result Date: 12/25/2021 CLINICAL DATA:  Left leg pain EXAM: MRI OF LOWER LEFT EXTREMITY WITH CONTRAST TECHNIQUE: Multiplanar, multisequence MR imaging of the  left tibia and fibula was performed following the administration of intravenous contrast. CONTRAST:  10 mL Vueway IV contrast. COMPARISON:  X-ray 11/19/2021.  MRI 12/12/2021 FINDINGS: Pre and postcontrast T1 weighted images of the left tibia and fibula were obtained following recent noncontrast MRI of the left lower leg performed 12/12/2021. The postcontrast images demonstrate synovial enhancement of the left knee joint compatible with synovitis.  No erosion. No periarticular soft tissue enhancement. The remaining images through the lower leg demonstrate no additional sites of abnormal postcontrast enhancement. No fluid collection or mass lesion. IMPRESSION: 1. Synovitis of the left knee joint. 2. Otherwise, no abnormal postcontrast enhancement of the left lower leg. Please see MRI report from 12/12/2021 for a more detailed characterization of the bony and soft tissue structures. Electronically Signed   By: Davina Poke D.O.   On: 12/25/2021 09:28   MR TIBIA FIBULA LEFT WO CONTRAST  Result Date: 12/13/2021 CLINICAL DATA:  Left lower leg pain laterally for 6 weeks. No known injury EXAM: MRI OF LOWER LEFT EXTREMITY WITHOUT CONTRAST TECHNIQUE: Multiplanar, multisequence MR imaging of the left tibia and fibula was performed. No intravenous contrast was administered. COMPARISON:  X-ray 11/19/2021 FINDINGS: Bones/Joint/Cartilage No acute fracture or dislocation. No bone marrow edema or periostitis. No marrow replacing bone lesion. 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 Moderate-sized knee joint effusions bilaterally. Evaluation of the intra-articular structures is limited on large field of view images which were not tailored for assessment of the knee. No advanced arthropathy is evident at the left ankle. Ligaments Mild periligamentous edema associated with the MCL of the left knee is likely reactive secondary to medial compartment osteoarthritis and marginal osteophytes. No acute  ligamentous abnormality is identified. Muscles and Tendons Normal muscle bulk and signal intensity without edema, atrophy, or fatty infiltration. Tendinous structures of the knee and ankle appear intact. Soft tissues Mild subcutaneous edema. No fluid collection. No solid or cystic mass identified within the soft tissues. IMPRESSION: 1. No acute osseous abnormality of the left tibia or fibula. 2. 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 Moderate-sized bilateral knee joint effusions. 3. Periligamentous edema of the MCL of the left knee is likely reactive to underlying degenerative changes. Electronically Signed   By: Davina Poke D.O.   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 BRCA 1 and 2 negative, Oncotype DX score 17 which is considered low risk.  PLAN:    1. 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  This occurred while patient was taking Aromasin. Oncotype was not sent on her second breast cancer.  Previously, CT scans and bone scans reviewed independently with no evidence of metastatic disease confirming stage I malignancy.  Patient completed cycle 5 of Taxotere and Cytoxan on September 16, 2017.  She received 1 additional cycle secondary to a reaction she had during cycle 1 and did not complete treatment at that time.  She has now completed reconstruction.  Patient does not require additional mammograms given her bilateral mastectomy.  Continue letrozole for a minimum of 5 years through June 2024.  Return to clinic in 6 months with video assisted telemedicine visit.  2. Genetic testing: Patient was found to be BRCA 1 and 2 negative. She expressed understanding  that although her genetic testing was negative, her children are at higher risk for breast cancer than the general population.   3.  Neuropathy: Patient does not complain of this today.  Continue gabapentin as prescribed. 4.  Chest wall mass: CT scan results from January 19, 2019 reviewed independently with a nonspecific 2.5 cm chest wall mass.  Subsequent PET  scan on February 03, 2019 did not reveal any hypermetabolism.  No further interventions are needed.   6.  Bone health: Patient's most recent bone mineral density on December 01, 2019 reported T score of 0.7 which continues to be within normal limits.  Continue calcium and vitamin D supplementation.  Repeat in September 2023.  I provided 20 minutes of face-to-face video visit time during this encounter which included chart review, counseling, and coordination of care as documented above.   Patient expressed understanding and was in agreement with this plan. She also understands that She can call clinic at any time with any questions, concerns, or complaints.    Lloyd Huger, MD 01/01/22 10:21 PM

## 2022-01-02 ENCOUNTER — Encounter: Payer: Self-pay | Admitting: Oncology

## 2022-01-02 ENCOUNTER — Inpatient Hospital Stay: Payer: Medicare Other

## 2022-01-02 ENCOUNTER — Inpatient Hospital Stay: Payer: Medicare Other | Attending: Oncology | Admitting: Oncology

## 2022-01-02 VITALS — BP 141/100 | HR 51 | Temp 98.5°F | Wt 318.0 lb

## 2022-01-02 DIAGNOSIS — M79606 Pain in leg, unspecified: Secondary | ICD-10-CM | POA: Diagnosis not present

## 2022-01-02 DIAGNOSIS — C50411 Malignant neoplasm of upper-outer quadrant of right female breast: Secondary | ICD-10-CM | POA: Diagnosis present

## 2022-01-02 DIAGNOSIS — Z170 Estrogen receptor positive status [ER+]: Secondary | ICD-10-CM | POA: Insufficient documentation

## 2022-01-02 DIAGNOSIS — Z9013 Acquired absence of bilateral breasts and nipples: Secondary | ICD-10-CM | POA: Insufficient documentation

## 2022-01-02 DIAGNOSIS — Z803 Family history of malignant neoplasm of breast: Secondary | ICD-10-CM | POA: Diagnosis not present

## 2022-01-02 DIAGNOSIS — G629 Polyneuropathy, unspecified: Secondary | ICD-10-CM | POA: Insufficient documentation

## 2022-01-02 NOTE — Addendum Note (Signed)
Addended by: Carl Best H on: 01/02/2022 04:16 PM   Modules accepted: Orders

## 2022-01-03 LAB — CANCER ANTIGEN 27.29: CA 27.29: 36.9 U/mL (ref 0.0–38.6)

## 2022-01-05 NOTE — Progress Notes (Unsigned)
Chief Complaint:   OBESITY Michelle Mcmillan is here to discuss her progress with her obesity treatment plan along with follow-up of her obesity related diagnoses. Michelle Mcmillan is on the Category 3 Plan and the [place] and states she is following her eating plan approximately 50% of the time. Michelle Mcmillan states she is not currently exercising.  Today's visit was #: 11 Starting weight: 333 lbs Starting date: 06/20/2021 Today's weight: 315 lbs Today's date: 12/31/2021 Total lbs lost to date: 18 Total lbs lost since last in-office visit: 0  Interim History: Michelle Mcmillan is tired of protein. She reports it has been difficult to eat on plan lately due to knee pain, but pain is finally improving after recent knee injection.  Subjective:   1. Essential hypertension Pt denies headache, dizziness, or visual changes. Her BP at home averages 121/75.  Assessment/Plan:  No orders of the defined types were placed in this encounter.   Medications Discontinued During This Encounter  Medication Reason   hydrochlorothiazide (MICROZIDE) 12.5 MG capsule Reorder     Meds ordered this encounter  Medications   hydrochlorothiazide (MICROZIDE) 12.5 MG capsule    Sig: Take 1 capsule (12.5 mg total) by mouth daily.    Dispense:  30 capsule    Refill:  0     1. Essential hypertension Michelle Mcmillan is working on healthy weight loss and exercise to improve blood pressure control. We will watch for signs of hypotension as she continues her lifestyle modifications. Check BP at home 3-4 days a week.  Refill- hydrochlorothiazide (MICROZIDE) 12.5 MG capsule; Take 1 capsule (12.5 mg total) by mouth daily.  Dispense: 30 capsule; Refill: 0  2. Obesity, Current BMI is 46.6 Michelle Mcmillan is currently in the action stage of change. As such, her goal is to continue with weight loss efforts. She has agreed to the Category 3 Plan and the [place].   Pt may substitute 1 Premier Protein Shake for 4 oz of protein per  day.  Exercise goals: All adults should avoid inactivity. Some physical activity is better than none, and adults who participate in any amount of physical activity gain some health benefits.  Behavioral modification strategies: increasing lean protein intake and decreasing simple carbohydrates.  Michelle Mcmillan has agreed to follow-up with our clinic in 3-4 weeks. She was informed of the importance of frequent follow-up visits to maximize her success with intensive lifestyle modifications for her multiple health conditions.   Objective:   Blood pressure 124/87, pulse (!) 59, temperature 98.3 F (36.8 C), height '5\' 9"'$  (1.753 m), weight (!) 315 lb (142.9 kg), SpO2 98 %. Body mass index is 46.52 kg/m.  General: Cooperative, alert, well developed, in no acute distress. HEENT: Conjunctivae and lids unremarkable. Cardiovascular: Regular rhythm.  Lungs: Normal work of breathing. Neurologic: No focal deficits.   Lab Results  Component Value Date   CREATININE 0.83 12/31/2021   BUN 23 12/31/2021   NA 145 (H) 12/31/2021   K 4.3 12/31/2021   CL 104 12/31/2021   CO2 26 12/31/2021   Lab Results  Component Value Date   ALT 17 12/31/2021   AST 24 12/31/2021   ALKPHOS 89 12/31/2021   BILITOT 0.2 12/31/2021   Lab Results  Component Value Date   HGBA1C 6.0 (H) 12/31/2021   HGBA1C 6.0 (H) 06/20/2021   Lab Results  Component Value Date   INSULIN 11.0 12/31/2021   INSULIN 11.8 06/20/2021   Lab Results  Component Value Date   TSH 2.020 06/20/2021  Lab Results  Component Value Date   CHOL 180 12/31/2021   HDL 75 12/31/2021   LDLCALC 94 12/31/2021   TRIG 55 12/31/2021   Lab Results  Component Value Date   VD25OH 48.2 12/31/2021   VD25OH 45.2 06/20/2021   Lab Results  Component Value Date   WBC 6.3 11/19/2021   HGB 11.6 (L) 11/19/2021   HCT 37.6 11/19/2021   MCV 83.9 11/19/2021   PLT 220 11/19/2021   Lab Results  Component Value Date   IRON 49 09/06/2021   TIBC 273 09/06/2021    FERRITIN 224 09/06/2021    Obesity Behavioral Intervention:   Approximately 15 minutes were spent on the discussion below.  ASK: We discussed the diagnosis of obesity with Michelle Mcmillan today and Michelle Mcmillan agreed to give [place] permission to discuss obesity behavioral modification therapy today.  ASSESS: Michelle Mcmillan has the diagnosis of obesity and her BMI today is 46.6. Michelle Mcmillan is in the action stage of change.   ADVISE: Michelle Mcmillan was educated on the multiple health risks of obesity as well as the benefit of weight loss to improve her health. She was advised of the need for long term treatment and the importance of lifestyle modifications to improve her current health and to decrease her risk of future health problems.  AGREE: Multiple dietary modification options and treatment options were discussed and Michelle Mcmillan agreed to follow the recommendations documented in the above note.  ARRANGE: Michelle Mcmillan was educated on the importance of frequent visits to treat obesity as outlined per CMS and USPSTF guidelines and agreed to schedule her next follow up appointment today.  Attestation Statements:   Reviewed by clinician on day of visit: allergies, medications, problem list, medical history, surgical history, family history, social history, and previous encounter notes.  I, Kathlene November, BS, CMA, am acting as transcriptionist for [employer], DO.   I have reviewed the above documentation for accuracy and completeness, and I agree with the above. [name], D.O.  The [place] was signed into law in 2016 which includes the topic of electronic health records.  This provides immediate access to information in MyChart.  This includes consultation notes, operative notes, office notes, lab results and pathology reports.  If you have any questions about what you read please let us know at your next visit so we can discuss your concerns and take corrective action if need be.  We are right  here with you.

## 2022-01-09 ENCOUNTER — Ambulatory Visit
Admission: RE | Admit: 2022-01-09 | Discharge: 2022-01-09 | Disposition: A | Discharge status: Home / Self Care | Payer: Medicare Other | Source: Ambulatory Visit | Attending: Oncology | Admitting: Oncology

## 2022-01-09 ENCOUNTER — Encounter
Admission: RE | Admit: 2022-01-09 | Discharge: 2022-01-09 | Disposition: A | Discharge status: Home / Self Care | Payer: Medicare Other | Source: Ambulatory Visit | Attending: Oncology | Admitting: Oncology

## 2022-01-09 DIAGNOSIS — Z170 Estrogen receptor positive status [ER+]: Secondary | ICD-10-CM | POA: Diagnosis present

## 2022-01-09 DIAGNOSIS — C50411 Malignant neoplasm of upper-outer quadrant of right female breast: Secondary | ICD-10-CM | POA: Insufficient documentation

## 2022-01-09 MED ORDER — TECHNETIUM TC 99M MEDRONATE IV KIT
20.0000 | PACK | Freq: Once | INTRAVENOUS | Status: AC | PRN
  Administered 2022-01-09: 20.45 via INTRAVENOUS

## 2022-01-11 ENCOUNTER — Other Ambulatory Visit (INDEPENDENT_AMBULATORY_CARE_PROVIDER_SITE_OTHER): Payer: Self-pay | Admitting: Family Medicine

## 2022-01-11 DIAGNOSIS — I10 Essential (primary) hypertension: Secondary | ICD-10-CM

## 2022-01-17 ENCOUNTER — Ambulatory Visit (INDEPENDENT_AMBULATORY_CARE_PROVIDER_SITE_OTHER): Payer: Medicare Other | Admitting: Physician Assistant

## 2022-01-17 ENCOUNTER — Encounter: Payer: Self-pay | Admitting: Physician Assistant

## 2022-01-17 DIAGNOSIS — M25562 Pain in left knee: Secondary | ICD-10-CM

## 2022-01-17 NOTE — Progress Notes (Signed)
HPI: Ms. Farfan returns today she is called to let us know that he was doing well.  She is having no pain in the knee having no mechanical symptoms.  Did move her knee back and forth and she has good range of motion without pain.  She is nontender over the lateral joint line.  We will do see her back in on an as-needed basis.  She can have cortisone injections as often as every 3 months if needed.  However I did discuss with her if she has recurrent fusion or develops mechanical symptoms would recommend MRI.  No charge for today's office visit.

## 2022-01-20 ENCOUNTER — Other Ambulatory Visit (INDEPENDENT_AMBULATORY_CARE_PROVIDER_SITE_OTHER): Payer: Self-pay | Admitting: Family Medicine

## 2022-01-20 DIAGNOSIS — I10 Essential (primary) hypertension: Secondary | ICD-10-CM

## 2022-01-22 ENCOUNTER — Ambulatory Visit: Payer: Medicare Other | Admitting: Plastic Surgery

## 2022-01-22 ENCOUNTER — Encounter: Payer: Self-pay | Admitting: Plastic Surgery

## 2022-01-22 DIAGNOSIS — Z853 Personal history of malignant neoplasm of breast: Secondary | ICD-10-CM | POA: Diagnosis not present

## 2022-01-22 DIAGNOSIS — Z9013 Acquired absence of bilateral breasts and nipples: Secondary | ICD-10-CM | POA: Diagnosis not present

## 2022-01-22 DIAGNOSIS — N6489 Other specified disorders of breast: Secondary | ICD-10-CM

## 2022-01-22 DIAGNOSIS — Z923 Personal history of irradiation: Secondary | ICD-10-CM

## 2022-01-22 DIAGNOSIS — N6459 Other signs and symptoms in breast: Secondary | ICD-10-CM

## 2022-01-22 NOTE — Progress Notes (Signed)
Patient ID: Michelle Mcmillan, female    DOB: 1971/03/21, 51 y.o.   MRN: [id number]   Chief Complaint  Patient presents with   Follow-up   Breast Problem    The patient is a 51 yrs old female here with her husband for a yearly follow up on her bilateral breast reconstruction.  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  It was estrogen and progesterone positive and HER2 negative.  She had also had right breast cancer treated with a partial mastectomy and radiation in 2007.  She decided on a prophylactic left mastectomy because of the recurrence.  Went bilateral breast reconstruction with expander placement.  The right side had very poor expansion due to the radiation so March 2020 and a latissimus muscle flap was constructed.  In July 2020 she went bilateral exchange of the tissue expanders for implants.  She had revisional surgery in January 2021 with fat grafting and release of scar contracture of the expander capsule.  Since then she is doing really well overall.  She has also lost some weight and is feeling very good.  She has a little bit of tenderness in the rib area of the right breast slightly lower than the inframammary fold.  If this continues or gets worse then we will move to an ultrasound.  There are no other concerning areas and she does have nipple areola tattooing for her reconstruction.     Review of Systems  Constitutional: Negative.   HENT: Negative.    Eyes: Negative.   Respiratory: Negative.  Negative for chest tightness and shortness of breath.   Cardiovascular: Negative.  Negative for leg swelling.  Endocrine: Negative.   Genitourinary: Negative.   Musculoskeletal: Negative.   Neurological: Negative.   Hematological: Negative.     Past Medical History:  Diagnosis Date   Anemia    H/O   Anemia    Arthritis    RIGHT KNEE   Back pain    Breast cancer ([place]) 2017   right breast   Cancer ([place])    Radiation M-F  (08/03/2015)   Carpal tunnel syndrome    Constipation    Depression    Dyspnea    GERD (gastroesophageal reflux disease)    NO MEDS   Headache    migraines   Hypertension    Joint pain    Last menstrual period (LMP) > 10 days ago 08/2015   Lower extremity edema    Migraines    Neuropathy    OSA (obstructive sleep apnea)    Personal history of radiation therapy    Prediabetes    SOB (shortness of breath)     Past Surgical History:  Procedure Laterality Date   BREAST BIOPSY Right 05/08/2017   invasive mamm. ca   BREAST EXCISIONAL BIOPSY Right 06/15/15   breast cancer   BREAST LUMPECTOMY WITH NEEDLE LOCALIZATION Right 06/15/2015   Procedure: BREAST LUMPECTOMY WITH NEEDLE LOCALIZATION;  Surgeon: Hubbard Robinson, MD;  Location: [facility];  Service: General;  Laterality: Right;   BREAST RECONSTRUCTION WITH PLACEMENT OF TISSUE EXPANDER AND FLEX HD (ACELLULAR HYDRATED DERMIS) Left 02/09/2018   Procedure: BREAST RECONSTRUCTION WITH PLACEMENT OF TISSUE EXPANDER AND FLEX HD (ACELLULAR HYDRATED DERMIS);  Surgeon: Wallace Going, DO;  Location: [facility];  Service: Plastics;  Laterality: Left;   BREAST SURGERY     CESAREAN SECTION  1988   x1   COLONOSCOPY WITH PROPOFOL N/A 01/23/2017  Procedure: COLONOSCOPY WITH PROPOFOL;  Surgeon: Lin Landsman, MD;  Location: [facility] END[facility]COPY;  Service: Gastroenterology;  Laterality: N/A;   ESOPHAGOGASTRODUODENOSCOPY (EGD) WITH PROPOFOL N/A 01/23/2017   Procedure: ESOPHAGOGASTRODUODENOSCOPY (EGD) WITH PROPOFOL;  Surgeon: Lin Landsman, MD;  Location: [facility] ENDOSCOPY;  Service: Gastroenterology;  Laterality: N/A;   LATISSIMUS FLAP TO BREAST Right 06/01/2018   Procedure: RIGHT BREAST RECONSTRUCTION WITH LATISSIMUS MYOCUTANEOUS FLAP;  Surgeon: Wallace Going, DO;  Location: [place];  Service: Plastics;  Laterality: Right;   LIPOSUCTION WITH LIPOFILLING Bilateral 10/15/2018   Procedure: LIPOSUCTION TO BILATERAL BREASTS;  Surgeon: Wallace Going, DO;  Location: [place];  Service: Plastics;  Laterality: Bilateral;   LIPOSUCTION WITH LIPOFILLING Bilateral 04/22/2019   Procedure: fat grafting/liposuction and lipofilling bilateral breasts;  Surgeon: Wallace Going, DO;  Location: [place];  Service: Plastics;  Laterality: Bilateral;   MASS EXCISION N/A 05/20/2016   Procedure: EXCISION CHEST WALL MASS;  Surgeon: Florene Glen, MD;  Location: [facility];  Service: General;  Laterality: N/A;   RECONSTRUCTION BREAST W/ LATISSIMUS DORSI FLAP Right 06/01/2018   REMOVAL OF BILATERAL TISSUE EXPANDERS WITH PLACEMENT OF BILATERAL BREAST IMPLANTS Bilateral 10/15/2018   Procedure: REMOVAL OF BILATERAL TISSUE EXPANDERS WITH PLACEMENT OF BILATERAL BREAST IMPLANTS;  Surgeon: Wallace Going, DO;  Location: [place];  Service: Plastics;  Laterality: Bilateral;   SCAR REVISION Bilateral 04/22/2019   Procedure: Excision of bilateral scar contracture;  Surgeon: Wallace Going, DO;  Location: [place];  Service: Plastics;  Laterality: Bilateral;  total case 90 min   SENTINEL NODE BIOPSY Right 06/15/2015   Procedure: SENTINEL NODE BIOPSY;  Surgeon: Hubbard Robinson, MD;  Location: [facility];  Service: General;  Laterality: Right;   TOTAL MASTECTOMY Right 05/22/2017   Procedure: TOTAL MASTECTOMY;  Surgeon: Florene Glen, MD;  Location: [facility];  Service: General;  Laterality: Right;   TOTAL MASTECTOMY Left 02/09/2018   Procedure: LEFT PROPHYLACTIC  MASTECTOMY;  Surgeon: Jovita Kussmaul, MD;  Location: [facility];  Service: General;  Laterality: Left;   TUBAL LIGATION  2004   ULNAR NERVE TRANSPOSITION Left 01/29/2019   Procedure: LEFT ULNAR NERVE DECOMPRESSION/TRANSPOSITION;  Surgeon: Leanora Cover, MD;  Location: [place];  Service: Orthopedics;  Laterality: Left;      Current Outpatient Medications:    APPLE CIDER VINEGAR PO, Take by mouth. Two tabs  daily, Disp: , Rfl:    buPROPion (WELLBUTRIN XL) 150 MG 24 hr tablet, Take 150 mg by mouth daily., Disp: , Rfl:    butalbital-aspirin-caffeine (FIORINAL) 50-325-40 MG capsule, Take 1 capsule by mouth as needed for headache., Disp: , Rfl:    calcium-vitamin D (OSCAL WITH D) 500-200 MG-UNIT TABS tablet, Take 1 tablet by mouth 2 (two) times daily. With meals, Disp: , Rfl:    diclofenac sodium (VOLTAREN) 1 % GEL, Apply 4 g topically 4 (four) times daily., Disp: , Rfl:    dorzolamide-timolol (COSOPT) 22.3-6.8 MG/ML ophthalmic solution, Place 1 drop into both eyes every morning., Disp: , Rfl:    ELDERBERRY PO, Take 50 mg by mouth daily., Disp: , Rfl:    Ferrous Sulfate (IRON) 325 (65 Fe) MG TABS, Take 1 tablet (325 mg total) by mouth 2 (two) times daily., Disp: 60 each, Rfl: 2   gabapentin (NEURONTIN) 300 MG capsule, TAKE 1 CAPSULE BY MOUTH 3  TIMES DAILY AND MAY TAKE 2  CAPSULES BY MOUTH AT  BEDTIME IF MORE BOTHERSOME  AT NIGHT, Disp: 450 capsule, Rfl: 0   hydrochlorothiazide (MICROZIDE) 12.5 MG capsule, Take 1 capsule (12.5 mg total) by mouth daily., Disp: 30 capsule, Rfl: 0   HYDROcodone-acetaminophen (NORCO/VICODIN) 5-325 MG tablet, Take 1 tablet by mouth every 6 (six) hours as needed for severe pain., Disp: 5 tablet, Rfl: 0   ibuprofen (ADVIL) 800 MG tablet, TAKE 1 TABLET BY MOUTH EVERY 8 HOURS WITH FOOD AS NEEDED FOR PAIN, Disp: , Rfl:    latanoprost (XALATAN) 0.005 % ophthalmic solution, Place 1 drop into both eyes at bedtime., Disp: , Rfl:    letrozole (FEMARA) 2.5 MG tablet, TAKE 1 TABLET BY MOUTH  DAILY, Disp: 90 tablet, Rfl: 3   Melatonin 5 MG CAPS, Take 5 mg by mouth at bedtime., Disp: , Rfl:    omeprazole (PRILOSEC) 20 MG capsule, Take 1 capsule (20 mg total) by mouth daily., Disp: 30 capsule, Rfl: 3   rizatriptan (MAXALT) 10 MG tablet, SMARTSIG:1 Tablet(s) By Mouth 1 to 2 Times Daily, Disp: , Rfl:    Objective:   There were no vitals filed for this visit.  Physical Exam Vitals  reviewed.  Constitutional:      Appearance: Normal appearance.  HENT:     Head: Normocephalic and atraumatic.  Cardiovascular:     Rate and Rhythm: Normal rate.     Pulses: Normal pulses.  Pulmonary:     Effort: Pulmonary effort is normal.  Abdominal:     General: There is no distension.     Palpations: Abdomen is soft.  Musculoskeletal:        General: No swelling or deformity.  Skin:    General: Skin is warm.     Capillary Refill: Capillary refill takes less than 2 seconds.     Coloration: Skin is not jaundiced.     Findings: No bruising.  Neurological:     Mental Status: She is alert and oriented to person, place, and time.  Psychiatric:        Mood and Affect: Mood normal.        Behavior: Behavior normal.        Thought Content: Thought content normal.        Judgment: Judgment normal.     Assessment & Plan:  Postoperative breast asymmetry  It was great to see the patient again and to see if she is doing so well.  I would like to see her back in 1 year.  She knows to call if the area of the right breast gets worse or changes.  She is a candidate for ultrasound every 3 years.  She did have a MRI of her chest December 2021 she also had a bone density scan in 2021.  Pictures were obtained of the patient and placed in the chart with the patient's or guardian's permission.   Seth Ward, DO

## 2022-01-23 ENCOUNTER — Encounter (INDEPENDENT_AMBULATORY_CARE_PROVIDER_SITE_OTHER): Payer: Self-pay | Admitting: Family Medicine

## 2022-01-23 ENCOUNTER — Ambulatory Visit (INDEPENDENT_AMBULATORY_CARE_PROVIDER_SITE_OTHER): Payer: Medicare Other | Admitting: Family Medicine

## 2022-02-18 ENCOUNTER — Ambulatory Visit (INDEPENDENT_AMBULATORY_CARE_PROVIDER_SITE_OTHER): Payer: Medicare Other | Admitting: Family Medicine

## 2022-02-18 ENCOUNTER — Encounter (INDEPENDENT_AMBULATORY_CARE_PROVIDER_SITE_OTHER): Payer: Self-pay | Admitting: Family Medicine

## 2022-02-18 VITALS — BP 160/98 | HR 61 | Temp 98.6°F | Ht 69.0 in | Wt 326.6 lb

## 2022-02-18 DIAGNOSIS — R7303 Prediabetes: Secondary | ICD-10-CM | POA: Diagnosis not present

## 2022-02-18 DIAGNOSIS — I10 Essential (primary) hypertension: Secondary | ICD-10-CM | POA: Diagnosis not present

## 2022-02-18 DIAGNOSIS — E559 Vitamin D deficiency, unspecified: Secondary | ICD-10-CM

## 2022-02-18 DIAGNOSIS — E669 Obesity, unspecified: Secondary | ICD-10-CM

## 2022-02-18 DIAGNOSIS — Z6841 Body Mass Index (BMI) 40.0 and over, adult: Secondary | ICD-10-CM

## 2022-02-18 DIAGNOSIS — E7849 Other hyperlipidemia: Secondary | ICD-10-CM

## 2022-02-18 MED ORDER — HYDROCHLOROTHIAZIDE 12.5 MG PO CAPS: 12.5000 mg | ORAL_CAPSULE | Freq: Every day | ORAL | 0 refills | Status: DC

## 2022-03-04 NOTE — Progress Notes (Signed)
Chief Complaint:   OBESITY Michelle Mcmillan is here to discuss her progress with her obesity treatment plan along with follow-up of her obesity related diagnoses. Michelle Mcmillan is on the Category 3 Plan and the [place] and states she is following her eating plan approximately 50% of the time. Michelle Mcmillan states she is walking 20 minutes 3 times per week.  Today's visit was #: 12 Starting weight: 333 lbs Starting date: 06/20/2021 Today's weight: 326 lbs Today's date: 02/18/2022 Total lbs lost to date: 7 lbs Total lbs lost since last in-office visit: +11 lbs  Interim History: She has been struggling with staying on meal plan.  Food is expensive and difficult to keep enough in the home.  Patient does all the grocery shopping.  Using crock pot.  Here to review labs.   Subjective:   1. Essential hypertension Discussed labs with patient today. CMP-sodium increased to 145. Blood pressures at home 132/82-84 under good control.  Highest 148/92 once. She checks her blood pressure at least 4 days per week.   2. Prediabetes Discussed labs with patient today. A1c unchanged from prior at 6.0.    3. Other hyperlipidemia Discussed labs with patient today. Decreased LDL from 105 to 94 in 8 months.  HDL increased to 75 from 65. Good job.   4. Vitamin D deficiency Discussed labs with patient today. On Vitamin D and calcium supplements OTC daily.   Assessment/Plan:  No orders of the defined types were placed in this encounter.   Medications Discontinued During This Encounter  Medication Reason   hydrochlorothiazide (MICROZIDE) 12.5 MG capsule Reorder     Meds ordered this encounter  Medications   hydrochlorothiazide (MICROZIDE) 12.5 MG capsule    Sig: Take 1 capsule (12.5 mg total) by mouth daily.    Dispense:  30 capsule    Refill:  0     1. Essential hypertension Blood pressure at home is at goal the majority of the time.  Will consider adding losartan in the past if blood pressure  goes up at home.  Continue to decrease salt intake and increase water.(To decrease sodium levels)  Continue prudent nutritional plan and weight loss.   Refill - hydrochlorothiazide (MICROZIDE) 12.5 MG capsule; Take 1 capsule (12.5 mg total) by mouth daily.  Dispense: 30 capsule; Refill: 0  2. Prediabetes Handout on prediabetes and meal plan given again.   3. Other hyperlipidemia Counseling done on cholesterol panel and how foods affects values.  Increase exercise and PNP.    4. Vitamin D deficiency Vitamin D at goal at 29.2.  continue OTC supplement.   5. Obesity, Current BMI is 48.2 Michelle Mcmillan is currently in the action stage of change. As such, her goal is to continue with weight loss efforts. She has agreed to the Category 3 Plan.   Exercise goals: All adults should avoid inactivity. Some physical activity is better than none, and adults who participate in any amount of physical activity gain some health benefits. Increase activity as tolerated.   Behavioral modification strategies: increasing lean protein intake, decreasing simple carbohydrates, and holiday eating strategies .  Michelle Mcmillan has agreed to follow-up with our clinic in 3 weeks. She was informed of the importance of frequent follow-up visits to maximize her success with intensive lifestyle modifications for her multiple health conditions.   Objective:   Blood pressure (!) 160/98, pulse 61, temperature 98.6 F (37 C), height '5\' 9"'$  (1.753 m), weight (!) 326 lb 9.6 oz (148.1 kg), SpO2 97 %. Body  mass index is 48.23 kg/m.  General: Cooperative, alert, well developed, in no acute distress. HEENT: Conjunctivae and lids unremarkable. Cardiovascular: Regular rhythm.  Lungs: Normal work of breathing. Neurologic: No focal deficits.   Lab Results  Component Value Date   CREATININE 0.83 12/31/2021   BUN 23 12/31/2021   NA 145 (H) 12/31/2021   K 4.3 12/31/2021   CL 104 12/31/2021   CO2 26 12/31/2021   Lab Results  Component  Value Date   ALT 17 12/31/2021   AST 24 12/31/2021   ALKPHOS 89 12/31/2021   BILITOT 0.2 12/31/2021   Lab Results  Component Value Date   HGBA1C 6.0 (H) 12/31/2021   HGBA1C 6.0 (H) 06/20/2021   Lab Results  Component Value Date   INSULIN 11.0 12/31/2021   INSULIN 11.8 06/20/2021   Lab Results  Component Value Date   TSH 2.020 06/20/2021   Lab Results  Component Value Date   CHOL 180 12/31/2021   HDL 75 12/31/2021   LDLCALC 94 12/31/2021   TRIG 55 12/31/2021   Lab Results  Component Value Date   VD25OH 48.2 12/31/2021   VD25OH 45.2 06/20/2021   Lab Results  Component Value Date   WBC 6.3 11/19/2021   HGB 11.6 (L) 11/19/2021   HCT 37.6 11/19/2021   MCV 83.9 11/19/2021   PLT 220 11/19/2021   Lab Results  Component Value Date   IRON 49 09/06/2021   TIBC 273 09/06/2021   FERRITIN 224 09/06/2021    Attestation Statements:   Reviewed by clinician on day of visit: allergies, medications, problem list, medical history, surgical history, family history, social history, and previous encounter notes.  I, Davy Pique, RMA, am acting as Location manager for [employer], DO.   I have reviewed the above documentation for accuracy and completeness, and I agree with the above. [name], D.O.  The [place] was signed into law in 2016 which includes the topic of electronic health records.  This provides immediate access to information in MyChart.  This includes consultation notes, operative notes, office notes, lab results and pathology reports.  If you have any questions about what you read please let us know at your next visit so we can discuss your concerns and take corrective action if need be.  We are right here with you.

## 2022-03-07 ENCOUNTER — Encounter: Payer: Self-pay | Admitting: Oncology

## 2022-03-07 ENCOUNTER — Inpatient Hospital Stay: Payer: Medicare Other | Attending: Oncology | Admitting: Oncology

## 2022-03-07 ENCOUNTER — Ambulatory Visit: Payer: Medicare Other | Admitting: Oncology

## 2022-03-07 VITALS — BP 130/87 | HR 68 | Temp 96.6°F | Resp 18 | Ht 69.0 in | Wt 328.8 lb

## 2022-03-07 DIAGNOSIS — G629 Polyneuropathy, unspecified: Secondary | ICD-10-CM | POA: Insufficient documentation

## 2022-03-07 DIAGNOSIS — Z79811 Long term (current) use of aromatase inhibitors: Secondary | ICD-10-CM | POA: Diagnosis not present

## 2022-03-07 DIAGNOSIS — Z170 Estrogen receptor positive status [ER+]: Secondary | ICD-10-CM | POA: Insufficient documentation

## 2022-03-07 DIAGNOSIS — Z79899 Other long term (current) drug therapy: Secondary | ICD-10-CM | POA: Diagnosis not present

## 2022-03-07 DIAGNOSIS — M25562 Pain in left knee: Secondary | ICD-10-CM | POA: Insufficient documentation

## 2022-03-07 DIAGNOSIS — C50411 Malignant neoplasm of upper-outer quadrant of right female breast: Secondary | ICD-10-CM | POA: Insufficient documentation

## 2022-03-07 DIAGNOSIS — Z923 Personal history of irradiation: Secondary | ICD-10-CM | POA: Diagnosis not present

## 2022-03-07 NOTE — Progress Notes (Signed)
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   INTERVAL HISTORY: Patient returns to clinic today for routine evaluation.  She continues to have left knee pain, but states this is mildly improved.  She continues to take letrozole and is tolerating her treatment well without significant side effects.  She has no neurologic complaints.  She denies any other pain.  She has a good appetite and denies weight loss.  She has no chest pain, shortness of breath, cough, or hemoptysis.  She denies any nausea, vomiting, constipation, or diarrhea.  She has no urinary complaints.  Patient offers no further specific complaints today.  REVIEW OF SYSTEMS:   Review of Systems  Constitutional: Negative.  Negative for diaphoresis, fever, malaise/fatigue and weight loss.  Respiratory: Negative.  Negative for cough and shortness of breath.   Cardiovascular: Negative.  Negative for chest pain and leg swelling.  Gastrointestinal: Negative.  Negative for abdominal pain, constipation, diarrhea, nausea and vomiting.  Genitourinary: Negative.  Negative for dysuria.  Musculoskeletal:  Positive for joint pain. Negative for myalgias.  Skin: Negative.  Negative for rash.  Neurological: Negative.  Negative for tingling, sensory change, focal weakness and weakness.  Psychiatric/Behavioral: Negative.  The patient is not nervous/anxious and does not have insomnia.     As per HPI. Otherwise, a complete review  of systems is negative.  PAST MEDICAL HISTORY: Past Medical History:  Diagnosis Date   Anemia    H/O   Anemia    Arthritis    RIGHT KNEE   Back pain    Breast cancer ([place]) 2017   right breast   Cancer ([place])    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)   Carpal tunnel syndrome    Constipation    Depression    Dyspnea    GERD (gastroesophageal reflux disease)    NO MEDS   Headache    migraines   Hypertension    Joint pain    Last menstrual period (LMP) > 10 days ago 08/2015   Lower extremity edema    Migraines    Neuropathy    OSA (obstructive sleep apnea)    Personal history of radiation therapy    Prediabetes    SOB (shortness of breath)     PAST SURGICAL HISTORY: Past Surgical History:  Procedure Laterality Date   BREAST BIOPSY Right 05/08/2017   invasive mamm. ca   BREAST EXCISIONAL BIOPSY Right 06/15/15   breast cancer   BREAST LUMPECTOMY WITH NEEDLE LOCALIZATION Right 06/15/2015   Procedure: BREAST LUMPECTOMY WITH NEEDLE LOCALIZATION;  Surgeon: Hubbard Robinson, MD;  Location: [facility];  Service: General;  Laterality: Right;   BREAST RECONSTRUCTION WITH PLACEMENT OF TISSUE EXPANDER AND FLEX HD (ACELLULAR HYDRATED DERMIS) Left 02/09/2018   Procedure: BREAST RECONSTRUCTION WITH PLACEMENT OF TISSUE EXPANDER AND FLEX HD (ACELLULAR HYDRATED DERMIS);  Surgeon: Wallace Going, DO;  Location: [facility];  Service: Plastics;  Laterality: Left;   BREAST SURGERY     CESAREAN SECTION  1988   x1   COLONOSCOPY WITH PROPOFOL N/A 01/23/2017  Procedure: COLONOSCOPY WITH PROPOFOL;  Surgeon: Lin Landsman, MD;  Location: [facility] ENDOSCOPY;  Service: Gastroenterology;  Laterality: N/A;   ESOPHAGOGASTRODUODENOSCOPY (EGD) WITH PROPOFOL N/A 01/23/2017   Procedure: ESOPHAGOGASTRODUODENOSCOPY (EGD) WITH PROPOFOL;  Surgeon: Lin Landsman, MD;  Location: [facility] ENDOSCOPY;  Service: Gastroenterology;  Laterality: N/A;   LATISSIMUS FLAP TO BREAST Right 06/01/2018   Procedure: RIGHT BREAST  RECONSTRUCTION WITH LATISSIMUS MYOCUTANEOUS FLAP;  Surgeon: Wallace Going, DO;  Location: [place];  Service: Plastics;  Laterality: Right;   LIPOSUCTION WITH LIPOFILLING Bilateral 10/15/2018   Procedure: LIPOSUCTION TO BILATERAL BREASTS;  Surgeon: Wallace Going, DO;  Location: [place];  Service: Plastics;  Laterality: Bilateral;   LIPOSUCTION WITH LIPOFILLING Bilateral 04/22/2019   Procedure: fat grafting/liposuction and lipofilling bilateral breasts;  Surgeon: Wallace Going, DO;  Location: [place];  Service: Plastics;  Laterality: Bilateral;   MASS EXCISION N/A 05/20/2016   Procedure: EXCISION CHEST WALL MASS;  Surgeon: Florene Glen, MD;  Location: [facility];  Service: General;  Laterality: N/A;   RECONSTRUCTION BREAST W/ LATISSIMUS DORSI FLAP Right 06/01/2018   REMOVAL OF BILATERAL TISSUE EXPANDERS WITH PLACEMENT OF BILATERAL BREAST IMPLANTS Bilateral 10/15/2018   Procedure: REMOVAL OF BILATERAL TISSUE EXPANDERS WITH PLACEMENT OF BILATERAL BREAST IMPLANTS;  Surgeon: Wallace Going, DO;  Location: [place];  Service: Plastics;  Laterality: Bilateral;   SCAR REVISION Bilateral 04/22/2019   Procedure: Excision of bilateral scar contracture;  Surgeon: Wallace Going, DO;  Location: [place];  Service: Plastics;  Laterality: Bilateral;  total case 90 min   SENTINEL NODE BIOPSY Right 06/15/2015   Procedure: SENTINEL NODE BIOPSY;  Surgeon: Hubbard Robinson, MD;  Location: [facility];  Service: General;  Laterality: Right;   TOTAL MASTECTOMY Right 05/22/2017   Procedure: TOTAL MASTECTOMY;  Surgeon: Florene Glen, MD;  Location: [facility];  Service: General;  Laterality: Right;   TOTAL MASTECTOMY Left 02/09/2018   Procedure: LEFT PROPHYLACTIC  MASTECTOMY;  Surgeon: Jovita Kussmaul, MD;  Location: [facility];  Service: General;  Laterality: Left;   TUBAL LIGATION  2004   ULNAR NERVE TRANSPOSITION Left  01/29/2019   Procedure: LEFT ULNAR NERVE DECOMPRESSION/TRANSPOSITION;  Surgeon: Leanora Cover, MD;  Location: [place];  Service: Orthopedics;  Laterality: Left;    FAMILY HISTORY Family History  Problem Relation Age of Onset   Hypertension Mother    Obesity Mother    Hypertension Father    Diabetes Father    Hypertension Sister    Breast cancer Paternal Aunt        dx 43s   Breast cancer Paternal Aunt        dx 24s   Breast cancer Cousin 13       ADVANCED DIRECTIVES:    HEALTH MAINTENANCE: Social History   Tobacco Use   Smoking status: Never   Smokeless tobacco: Never  Vaping Use   Vaping Use: Never used  Substance Use Topics   Alcohol use: Not Currently   Drug use: No    Allergies  Allergen Reactions   Nitroglycerin Other (See Comments)    Headache, n/v.    Current Outpatient Medications  Medication Sig Dispense Refill   APPLE CIDER VINEGAR PO Take by mouth. Two tabs daily     buPROPion (WELLBUTRIN XL) 150 MG 24 hr tablet Take 150 mg by mouth daily.     butalbital-aspirin-caffeine (FIORINAL) 50-325-40 MG capsule Take 1 capsule by mouth as needed  for headache.     calcium-vitamin D (OSCAL WITH D) 500-200 MG-UNIT TABS tablet Take 1 tablet by mouth 2 (two) times daily. With meals     diclofenac sodium (VOLTAREN) 1 % GEL Apply 4 g topically 4 (four) times daily.     dorzolamide-timolol (COSOPT) 22.3-6.8 MG/ML ophthalmic solution Place 1 drop into both eyes every morning.     ELDERBERRY PO Take 50 mg by mouth daily.     Ferrous Sulfate (IRON) 325 (65 Fe) MG TABS Take 1 tablet (325 mg total) by mouth 2 (two) times daily. 60 each 2   gabapentin (NEURONTIN) 300 MG capsule TAKE 1 CAPSULE BY MOUTH 3  TIMES DAILY AND MAY TAKE 2  CAPSULES BY MOUTH AT  BEDTIME IF MORE BOTHERSOME  AT NIGHT 450 capsule 0   hydrochlorothiazide (MICROZIDE) 12.5 MG capsule Take 1 capsule (12.5 mg total) by mouth daily. 30 capsule 0   HYDROcodone-acetaminophen (NORCO/VICODIN)  5-325 MG tablet Take 1 tablet by mouth every 6 (six) hours as needed for severe pain. 5 tablet 0   ibuprofen (ADVIL) 800 MG tablet TAKE 1 TABLET BY MOUTH EVERY 8 HOURS WITH FOOD AS NEEDED FOR PAIN     latanoprost (XALATAN) 0.005 % ophthalmic solution Place 1 drop into both eyes at bedtime.     letrozole (FEMARA) 2.5 MG tablet TAKE 1 TABLET BY MOUTH  DAILY 90 tablet 3   Melatonin 5 MG CAPS Take 5 mg by mouth at bedtime.     omeprazole (PRILOSEC) 20 MG capsule Take 1 capsule (20 mg total) by mouth daily. 30 capsule 3   rizatriptan (MAXALT) 10 MG tablet SMARTSIG:1 Tablet(s) By Mouth 1 to 2 Times Daily     No current facility-administered medications for this visit.    OBJECTIVE: Vitals:   03/07/22 1342  BP: 130/87  Pulse: 68  Resp: 18  Temp: (!) 96.6 F (35.9 C)  SpO2: 99%     Body mass index is 48.56 kg/m.    ECOG FS:0 - Asymptomatic  General: Well-developed, well-nourished, no acute distress. Eyes: Pink conjunctiva, anicteric sclera. HEENT: Normocephalic, moist mucous membranes. Lungs: No audible wheezing or coughing. Heart: Regular rate and rhythm. Abdomen: Soft, nontender, no obvious distention. Musculoskeletal: No edema, cyanosis, or clubbing. Neuro: Alert, answering all questions appropriately. Cranial nerves grossly intact. Skin: No rashes or petechiae noted. Psych: Normal affect.  LAB RESULTS:  Lab Results  Component Value Date   NA 145 (H) 12/31/2021   K 4.3 12/31/2021   CL 104 12/31/2021   CO2 26 12/31/2021   GLUCOSE 86 12/31/2021   BUN 23 12/31/2021   CREATININE 0.83 12/31/2021   CALCIUM 9.6 12/31/2021   PROT 6.5 12/31/2021   ALBUMIN 4.1 12/31/2021   AST 24 12/31/2021   ALT 17 12/31/2021   ALKPHOS 89 12/31/2021   BILITOT 0.2 12/31/2021   GFRNONAA >60 11/19/2021   GFRAA >60 01/25/2019    Lab Results  Component Value Date   WBC 6.3 11/19/2021   NEUTROABS 4.8 09/06/2021   HGB 11.6 (L) 11/19/2021   HCT 37.6 11/19/2021   MCV 83.9 11/19/2021   PLT 220  11/19/2021     STUDIES: No results found.   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 BRCA 1 and 2 negative, Oncotype DX score 17 which is considered low risk.  PLAN:    1. Pathologic stage Ia ER/PR positive, HER-2 negative adenocarcinoma of the upper outer quadrant of the  right breast: Pathology from mastectomy on May 22, 2017 indicates that this was most likely a second primary and not a local recurrence of disease.  This occurred while patient was taking Aromasin. Oncotype was not sent on her second breast cancer.  Previously, CT scans and bone scans reviewed independently with no evidence of metastatic disease confirming stage I malignancy.  Patient completed cycle 5 of Taxotere and Cytoxan on September 16, 2017.  She received 1 additional cycle secondary to a reaction she had during cycle 1 and did not complete treatment at that time.  She has now completed reconstruction.  Patient does not require additional mammograms given her bilateral mastectomy.  Continue letrozole for a minimum of 5 years through June 2024.  Return to clinic in 6 months for routine evaluation. 2. Genetic testing: Patient was found to be BRCA 1 and 2 negative. She expressed understanding that although her genetic testing was negative, her children are at higher risk for breast cancer than the general population.   3.  Neuropathy: Patient does not complain of this today. 4.  Bone health: Patient's most recent bone mineral density on September 03, 2021 reported a normal T score of 0.7 which is unchanged from 2 years prior.  Repeat in June 2025.   5.  Knee pain: Unrelated to malignancy.  Musculoskeletal in nature.  Continue follow-up with orthopedics as directed.  Patient expressed understanding and was in agreement with this plan. She also understands that She can call clinic at any time with any questions, concerns, or complaints.     Lloyd Huger, MD 03/07/22 1:53 PM

## 2022-03-10 ENCOUNTER — Other Ambulatory Visit (INDEPENDENT_AMBULATORY_CARE_PROVIDER_SITE_OTHER): Payer: Self-pay | Admitting: Family Medicine

## 2022-03-10 DIAGNOSIS — I10 Essential (primary) hypertension: Secondary | ICD-10-CM

## 2022-03-14 ENCOUNTER — Ambulatory Visit (INDEPENDENT_AMBULATORY_CARE_PROVIDER_SITE_OTHER): Payer: Medicare Other | Admitting: Family Medicine

## 2022-03-19 ENCOUNTER — Ambulatory Visit (INDEPENDENT_AMBULATORY_CARE_PROVIDER_SITE_OTHER): Payer: Medicare Other | Admitting: Family Medicine

## 2022-03-19 ENCOUNTER — Encounter (INDEPENDENT_AMBULATORY_CARE_PROVIDER_SITE_OTHER): Payer: Self-pay | Admitting: Family Medicine

## 2022-03-19 VITALS — BP 140/92 | HR 55 | Temp 98.5°F | Ht 69.0 in | Wt 321.0 lb

## 2022-03-19 DIAGNOSIS — Z6841 Body Mass Index (BMI) 40.0 and over, adult: Secondary | ICD-10-CM

## 2022-03-19 DIAGNOSIS — M1712 Unilateral primary osteoarthritis, left knee: Secondary | ICD-10-CM | POA: Diagnosis not present

## 2022-03-19 DIAGNOSIS — E669 Obesity, unspecified: Secondary | ICD-10-CM | POA: Diagnosis not present

## 2022-03-19 DIAGNOSIS — R7303 Prediabetes: Secondary | ICD-10-CM | POA: Diagnosis not present

## 2022-03-19 DIAGNOSIS — I10 Essential (primary) hypertension: Secondary | ICD-10-CM | POA: Diagnosis not present

## 2022-03-19 MED ORDER — HYDROCHLOROTHIAZIDE 12.5 MG PO CAPS: 12.5000 mg | ORAL_CAPSULE | Freq: Every day | ORAL | 0 refills | Status: DC

## 2022-03-26 NOTE — Progress Notes (Signed)
Chief Complaint:   OBESITY Michelle Mcmillan is here to discuss her progress with her obesity treatment plan along with follow-up of her obesity related diagnoses. Michelle Mcmillan is on the Category 3 Plan and states she is following her eating plan approximately 50% of the time. Michelle Mcmillan states she is not exercising.  Today's visit was #: 26 Starting weight: 333 LBS Starting date: 06/20/2021 Today's weight: 321 LBS Today's date: 03/19/2022 Total lbs lost to date: 12 LBS Total lbs lost since last in-office visit: 5 LBS  Interim History:  Strength has reduced.  Cooking more at home.  Family is supportive.  She traveled to [place] to visit family.  Motivated to continue to lose weight for knee replacement.  Subjective:   1. Pre-diabetes Last A1c was 6.0, on 12/31/2021.  Patient has never been on metformin.  2. Osteoarthritis of left knee, unspecified osteoarthritis type Actively working on BMI reduction for total knee replacement.  Pain limits walking.  Next corticosteroid injection scheduled for January 2024.  3. Essential hypertension Blood pressure elevated today.  Did not take HCTZ today.  Blood pressures at home are ranging 130s/80s.  Assessment/Plan:   1. Pre-diabetes Continue to reduce sugar intake, increase exercise.  Recheck A1c in 3 to 4 months.  2. Osteoarthritis of left knee, unspecified osteoarthritis type Tylenol 650 mg every 8 hours as needed.  Plan for water aerobics > 1 time a week.  3. Essential hypertension Continue heart healthy diet, active plan for weight reduction.  Refill- hydrochlorothiazide (MICROZIDE) 12.5 MG capsule; Take 1 capsule (12.5 mg total) by mouth daily.  Dispense: 90 capsule; Refill: 0  4. Obesity,current BMI 47.5 1.  Increase water intake. 2.  Try out 1 midmorning water aerobics class.  Michelle Mcmillan is currently in the action stage of change. As such, her goal is to continue with weight loss efforts. She has agreed to the Category 3 Plan.    Exercise goals: All adults should avoid inactivity. Some physical activity is better than none, and adults who participate in any amount of physical activity gain some health benefits.  Behavioral modification strategies: increasing lean protein intake, increasing water intake, decreasing liquid calories, meal planning and cooking strategies, keeping healthy foods in the home, holiday eating strategies , and avoiding temptations.  Michelle Mcmillan has agreed to follow-up with our clinic in 3-4 weeks. She was informed of the importance of frequent follow-up visits to maximize her success with intensive lifestyle modifications for her multiple health conditions.   Objective:   Blood pressure (!) 140/92, pulse (!) 55, temperature 98.5 F (36.9 C), height '5\' 9"'$  (1.753 m), weight (!) 321 lb (145.6 kg), SpO2 98 %. Body mass index is 47.4 kg/m.  General: Cooperative, alert, well developed, in no acute distress. HEENT: Conjunctivae and lids unremarkable. Cardiovascular: Regular rhythm.  Lungs: Normal work of breathing. Neurologic: No focal deficits.   Lab Results  Component Value Date   CREATININE 0.83 12/31/2021   BUN 23 12/31/2021   NA 145 (H) 12/31/2021   K 4.3 12/31/2021   CL 104 12/31/2021   CO2 26 12/31/2021   Lab Results  Component Value Date   ALT 17 12/31/2021   AST 24 12/31/2021   ALKPHOS 89 12/31/2021   BILITOT 0.2 12/31/2021   Lab Results  Component Value Date   HGBA1C 6.0 (H) 12/31/2021   HGBA1C 6.0 (H) 06/20/2021   Lab Results  Component Value Date   INSULIN 11.0 12/31/2021   INSULIN 11.8 06/20/2021   Lab Results  Component  Value Date   TSH 2.020 06/20/2021   Lab Results  Component Value Date   CHOL 180 12/31/2021   HDL 75 12/31/2021   LDLCALC 94 12/31/2021   TRIG 55 12/31/2021   Lab Results  Component Value Date   VD25OH 48.2 12/31/2021   VD25OH 45.2 06/20/2021   Lab Results  Component Value Date   WBC 6.3 11/19/2021   HGB 11.6 (L) 11/19/2021   HCT  37.6 11/19/2021   MCV 83.9 11/19/2021   PLT 220 11/19/2021   Lab Results  Component Value Date   IRON 49 09/06/2021   TIBC 273 09/06/2021   FERRITIN 224 09/06/2021    Attestation Statements:   Reviewed by clinician on day of visit: allergies, medications, problem list, medical history, surgical history, family history, social history, and previous encounter notes.  I, Davy Pique, am acting as Location manager for Loyal Gambler, DO.  I have reviewed the above documentation for accuracy and completeness, and I agree with the above. Dell Ponto, DO

## 2022-04-15 ENCOUNTER — Telehealth: Payer: Self-pay | Admitting: *Deleted

## 2022-04-15 NOTE — Telephone Encounter (Signed)
Received on (03/20/22) via of fax DME Standard Written Order from [facility].  Requesting sign and return. Given to provider to review and sign.     DME Standard Written Order signed and faxed back to [facility].  Confirmation received and copy scanned into the chart.//AB/CMA

## 2022-04-17 ENCOUNTER — Encounter (INDEPENDENT_AMBULATORY_CARE_PROVIDER_SITE_OTHER): Payer: Self-pay | Admitting: Family Medicine

## 2022-04-17 ENCOUNTER — Ambulatory Visit (INDEPENDENT_AMBULATORY_CARE_PROVIDER_SITE_OTHER): Payer: Medicare Other | Admitting: Family Medicine

## 2022-04-17 VITALS — BP 139/89 | HR 59 | Temp 98.0°F | Ht 69.0 in | Wt 322.6 lb

## 2022-04-17 DIAGNOSIS — Z6841 Body Mass Index (BMI) 40.0 and over, adult: Secondary | ICD-10-CM

## 2022-04-17 DIAGNOSIS — E669 Obesity, unspecified: Secondary | ICD-10-CM | POA: Diagnosis not present

## 2022-04-17 DIAGNOSIS — R7303 Prediabetes: Secondary | ICD-10-CM

## 2022-04-17 DIAGNOSIS — G43909 Migraine, unspecified, not intractable, without status migrainosus: Secondary | ICD-10-CM

## 2022-04-17 DIAGNOSIS — I10 Essential (primary) hypertension: Secondary | ICD-10-CM | POA: Diagnosis not present

## 2022-04-17 MED ORDER — TOPIRAMATE 50 MG PO TABS: ORAL_TABLET | ORAL | 0 refills | Status: DC

## 2022-05-06 DIAGNOSIS — M1712 Unilateral primary osteoarthritis, left knee: Secondary | ICD-10-CM | POA: Insufficient documentation

## 2022-05-06 NOTE — Progress Notes (Signed)
Chief Complaint:   OBESITY Michelle Mcmillan is here to discuss her progress with her obesity treatment plan along with follow-up of her obesity related diagnoses. Michelle Mcmillan is on the Category 3 Plan and states she is following her eating plan approximately 50% of the time. Michelle Mcmillan states she is walking for 20 minutes 3 times per week.  Today's visit was #: 14 Starting weight: 333 lbs Starting date: 06/20/2021 Today's weight: 322 lbs Today's date: 04/17/2022 Total lbs lost to date: 11 Total lbs lost since last in-office visit: 0  Interim History: Michelle Mcmillan had her anniversary on Sunday at [place], and ate salty foods. Her blood pressure is up, and she notes some headaches and increased cravings.   Subjective:   1. Essential hypertension Michelle Mcmillan's blood pressure at home is 142/93, and upper 138's/upper 80's at home. She is taking HCTZ.   2. Prediabetes Michelle Mcmillan has a diagnosis of prediabetes based on her elevated HgA1c and was informed this puts her at greater risk of developing diabetes. She notes cravings. She continues to work on diet and exercise to decrease her risk of diabetes. She denies nausea or hypoglycemia.  3. Migraine without status migrainosus, not intractable, unspecified migraine type Michelle Mcmillan notes migraine only 1 month and has Maxalt, and she uses it infrequently. She notes cravings.   Assessment/Plan:   There are no discontinued medications.   Meds ordered this encounter  Medications   topiramate (TOPAMAX) 50 MG tablet    Sig: One half tab by mouth daily for a week, then one tab by mouth daily.    Dispense:  30 tablet    Refill:  0     1. Essential hypertension BP not at goal today. Decrease salt intake, continue prudent nutritional plan, and home blood pressure monitoring. Goal 130/80 or less, weight loss, and exercise. Continue to monitor closely and follow-up with PCP if blood pressure is not at goal.   2. Prediabetes Declines metformin today, and wishes to work  on her meal plan.   3. Migraine without status migrainosus, not intractable, unspecified migraine type Start Topamax for cravings after risks and benefits of medications were discussed with the patient.   - topiramate (TOPAMAX) 50 MG tablet; One half tab by mouth daily for a week, then one tab by mouth daily.  Dispense: 30 tablet; Refill: 0  4. Obesity,current BMI 47.6 Michelle Mcmillan is currently in the action stage of change. As such, her goal is to continue with weight loss efforts. She has agreed to the Category 3 Plan with breakfast and lunch options.   Get back on the plan at least 70-80% of the time. Increase protein intake and decrease simple carbohydrates.   Exercise goals: As is, increase as tolerated.   Behavioral modification strategies: increasing lean protein intake and decreasing simple carbohydrates.  Michelle Mcmillan has agreed to follow-up with our clinic in 3 weeks. She was informed of the importance of frequent follow-up visits to maximize her success with intensive lifestyle modifications for her multiple health conditions.   Objective:   Blood pressure 139/89, pulse (!) 59, temperature 98 F (36.7 C), height '5\' 9"'$  (1.753 m), weight (!) 322 lb 9.6 oz (146.3 kg), SpO2 95 %. Body mass index is 47.64 kg/m.  General: Cooperative, alert, well developed, in no acute distress. HEENT: Conjunctivae and lids unremarkable. Cardiovascular: Regular rhythm.  Lungs: Normal work of breathing. Neurologic: No focal deficits.   Lab Results  Component Value Date   CREATININE 0.83 12/31/2021   BUN 23 12/31/2021  NA 145 (H) 12/31/2021   K 4.3 12/31/2021   CL 104 12/31/2021   CO2 26 12/31/2021   Lab Results  Component Value Date   ALT 17 12/31/2021   AST 24 12/31/2021   ALKPHOS 89 12/31/2021   BILITOT 0.2 12/31/2021   Lab Results  Component Value Date   HGBA1C 6.0 (H) 12/31/2021   HGBA1C 6.0 (H) 06/20/2021   Lab Results  Component Value Date   INSULIN 11.0 12/31/2021   INSULIN  11.8 06/20/2021   Lab Results  Component Value Date   TSH 2.020 06/20/2021   Lab Results  Component Value Date   CHOL 180 12/31/2021   HDL 75 12/31/2021   LDLCALC 94 12/31/2021   TRIG 55 12/31/2021   Lab Results  Component Value Date   VD25OH 48.2 12/31/2021   VD25OH 45.2 06/20/2021   Lab Results  Component Value Date   WBC 6.3 11/19/2021   HGB 11.6 (L) 11/19/2021   HCT 37.6 11/19/2021   MCV 83.9 11/19/2021   PLT 220 11/19/2021   Lab Results  Component Value Date   IRON 49 09/06/2021   TIBC 273 09/06/2021   FERRITIN 224 09/06/2021   Attestation Statements:   Reviewed by clinician on day of visit: allergies, medications, problem list, medical history, surgical history, family history, social history, and previous encounter notes.   Wilhemena Durie, am acting as transcriptionist for [employer], DO.  I have reviewed the above documentation for completeness, and I agree with the above. [name], D.O.  The [place] was signed into law in 2016 which includes the topic of electronic health records.  This provides immediate access to information in MyChart.  This includes consultation notes, operative notes, office notes, lab results and pathology reports.  If you have any questions about what you read please let us know at your next visit so we can discuss your concerns and take corrective action if need be.  We are right here with you.

## 2022-05-13 DIAGNOSIS — R632 Polyphagia: Secondary | ICD-10-CM | POA: Insufficient documentation

## 2022-05-14 ENCOUNTER — Ambulatory Visit (INDEPENDENT_AMBULATORY_CARE_PROVIDER_SITE_OTHER): Payer: Medicare Other | Admitting: Family Medicine

## 2022-05-14 ENCOUNTER — Encounter (INDEPENDENT_AMBULATORY_CARE_PROVIDER_SITE_OTHER): Payer: Self-pay | Admitting: Family Medicine

## 2022-05-14 VITALS — BP 140/84 | HR 54 | Temp 98.2°F | Ht 69.0 in | Wt 316.0 lb

## 2022-05-14 DIAGNOSIS — R7303 Prediabetes: Secondary | ICD-10-CM | POA: Diagnosis not present

## 2022-05-14 DIAGNOSIS — Z6841 Body Mass Index (BMI) 40.0 and over, adult: Secondary | ICD-10-CM

## 2022-05-14 DIAGNOSIS — I10 Essential (primary) hypertension: Secondary | ICD-10-CM | POA: Diagnosis not present

## 2022-05-14 DIAGNOSIS — R632 Polyphagia: Secondary | ICD-10-CM

## 2022-05-14 DIAGNOSIS — M1712 Unilateral primary osteoarthritis, left knee: Secondary | ICD-10-CM

## 2022-05-14 NOTE — Assessment & Plan Note (Deleted)
L knee pain with DJD limits walking intake Trying to get BMI down for TKR

## 2022-05-14 NOTE — Assessment & Plan Note (Signed)
Net weight loss is 17 pounds in the past 10 months of medically supervised weight management. Seeing improvements in food impulse control using Topamax. Doing better with sleep at night and reduction of late night snacking.

## 2022-05-14 NOTE — Assessment & Plan Note (Signed)
Husband continues to buy sweets Improved control over sugar intake with the use of topamax Declined use of metformin  Lab Results  Component Value Date   HGBA1C 6.0 (H) 12/31/2021

## 2022-05-14 NOTE — Progress Notes (Signed)
Office: [phone]  /  Fax: [phone]  WEIGHT SUMMARY AND BIOMETRICS  Medical Weight Loss Height: 5' 9"$  (1.753 m) Weight: 316 lb (143.3 kg) Temp: 98.2 F (36.8 C) Pulse Rate: (!) 54 BP: (!) 140/84 SpO2: 100 % Fasting: yes Labs: yes Today's Visit #: 15 Weight at Last VIsit: 322lb Weight Lost Since Last Visit: 6lb  Body Fat %: 53.6 % Fat Mass (lbs): 169.4 lbs Muscle Mass (lbs): 139.2 lbs Total Body Water (lbs): 107.4 lbs Visceral Fat Rating : 18 Starting Date: 06/20/21 Starting Weight: 333lb Total Weight Loss (lbs): 17 lb (7.711 kg)    HPI  Chief Complaint: OBESITY  Michelle Mcmillan is here to discuss her progress with her obesity treatment plan. She is on the the Category 3 Plan and states she is following her eating plan approximately 60 % of the time. She states she is walking 15 minutes 3 times per week.   Interval History:  Since last office visit she is doing better with walking outdoors 3 days, week at least 15 min.  She has is drinking more water.  Started Topamax 1 week ago and this has reduced her snack cravings.  She will move to a full tab today.     Pharmacotherapy: Topamax 50 mg once dialy  PHYSICAL EXAM:  Blood pressure (!) 140/84, pulse (!) 54, temperature 98.2 F (36.8 C), height 5' 9"$  (1.753 m), weight (!) 316 lb (143.3 kg), SpO2 100 %. Body mass index is 46.67 kg/m.  General: She is overweight, cooperative, alert, well developed, and in no acute distress. PSYCH: Has normal mood, affect and thought process.   HEENT: EOMI, sclerae are anicteric. Lungs: Normal breathing effort, no conversational dyspnea. Extremities: No edema.  Neurologic: No gross sensory or motor deficits. No tremors or fasciculations noted.    DIAGNOSTIC DATA REVIEWED:  BMET    Component Value Date/Time   NA 145 (H) 12/31/2021 0845   K 4.3 12/31/2021 0845   CL 104 12/31/2021 0845   CO2 26 12/31/2021 0845   GLUCOSE 86 12/31/2021 0845   GLUCOSE 98 11/19/2021 1639   BUN  23 12/31/2021 0845   CREATININE 0.83 12/31/2021 0845   CALCIUM 9.6 12/31/2021 0845   GFRNONAA >60 11/19/2021 1639   GFRAA >60 01/25/2019 1100   Lab Results  Component Value Date   HGBA1C 6.0 (H) 12/31/2021   HGBA1C 6.0 (H) 06/20/2021   Lab Results  Component Value Date   INSULIN 11.0 12/31/2021   INSULIN 11.8 06/20/2021   Lab Results  Component Value Date   TSH 2.020 06/20/2021   CBC    Component Value Date/Time   WBC 6.3 11/19/2021 1639   RBC 4.48 11/19/2021 1639   HGB 11.6 (L) 11/19/2021 1639   HCT 37.6 11/19/2021 1639   HCT 36.3 06/20/2021 0913   PLT 220 11/19/2021 1639   MCV 83.9 11/19/2021 1639   MCH 25.9 (L) 11/19/2021 1639   MCHC 30.9 11/19/2021 1639   RDW 17.2 (H) 11/19/2021 1639   Iron Studies    Component Value Date/Time   IRON 49 09/06/2021 1252   IRON 47 06/20/2021 0913   TIBC 273 09/06/2021 1252   TIBC 222 (L) 06/20/2021 0913   FERRITIN 224 09/06/2021 1252   FERRITIN 366 (H) 06/20/2021 0913   IRONPCTSAT 18 09/06/2021 1252   IRONPCTSAT 21 06/20/2021 0913   Lipid Panel     Component Value Date/Time   CHOL 180 12/31/2021 0845   TRIG 55 12/31/2021 0845   HDL 75 12/31/2021 0845  [place] 94 12/31/2021 0845   Hepatic Function Panel     Component Value Date/Time   PROT 6.5 12/31/2021 0845   ALBUMIN 4.1 12/31/2021 0845   AST 24 12/31/2021 0845   ALT 17 12/31/2021 0845   ALKPHOS 89 12/31/2021 0845   BILITOT 0.2 12/31/2021 0845      Component Value Date/Time   TSH 2.020 06/20/2021 0913   Nutritional Lab Results  Component Value Date   VD25OH 48.2 12/31/2021   VD25OH 45.2 06/20/2021     ASSESSMENT AND PLAN  TREATMENT PLAN FOR OBESITY:  Recommended Dietary Goals  Chade is currently in the action stage of change. As such, her goal is to continue weight management plan. She has agreed to the Category 3 Plan.  Behavioral Intervention  We discussed the following Behavioral Modification Strategies today: increasing lean protein  intake, increasing vegetables, increase water intake, work on meal planning and easy cooking plans, and think about ways to increase physical activity.  Additional resources provided today: NA  Recommended Physical Activity Goals  Shamella has been advised to work up to 150 minutes of moderate intensity aerobic activity a week and strengthening exercises 2-3 times per week for cardiovascular health, weight loss maintenance and preservation of muscle mass.   She has agreed to increase physical activity in their day and reduce sedentary time (increase NEAT).  and Will continue regular aerobic exercise 20 minutes, 3-4 times per week. Chosen activity walking.   Pharmacotherapy We discussed various medication options to help Ikia with her weight loss efforts and we both agreed to continuing Topamax 50 mg once daily, has prescription.  ASSOCIATED CONDITIONS ADDRESSED TODAY  Prediabetes Assessment & Plan: Husband continues to buy sweets Improved control over sugar intake with the use of topamax Declined use of metformin  Lab Results  Component Value Date   HGBA1C 6.0 (H) 12/31/2021      Morbid obesity ([place]) Assessment & Plan: Net weight loss is 17 pounds in the past 10 months of medically supervised weight management. Seeing improvements in food impulse control using Topamax. Doing better with sleep at night and reduction of late night snacking.   Essential hypertension Assessment & Plan: Blood pressure is mildly elevated today.  Not checking home blood pressure readings Taking all blood pressure medicine as directed Actively working on weight loss    Obesity,current BMI 46.7  Primary osteoarthritis of left knee Assessment & Plan: Stable Using cane for ambulation Able to walk 15 to 20 minutes 3 times a week Considering restart water exercise once time allows Needs BMI reduction for TKR       No follow-ups on file.Marland Kitchen She was informed of the importance of frequent  follow up visits to maximize her success with intensive lifestyle modifications for her multiple health conditions.   ATTESTASTION STATEMENTS:  Reviewed by clinician on day of visit: allergies, medications, problem list, medical history, surgical history, family history, social history, and previous encounter notes.   Time spent on visit including pre-visit chart review and post-visit care and charting was 30 minutes.    Dell Ponto, DO

## 2022-05-14 NOTE — Assessment & Plan Note (Signed)
Stable Using cane for ambulation Able to walk 15 to 20 minutes 3 times a week Considering restart water exercise once time allows Needs BMI reduction for TKR

## 2022-05-14 NOTE — Assessment & Plan Note (Signed)
Blood pressure is mildly elevated today.  Not checking home blood pressure readings Taking all blood pressure medicine as directed Actively working on weight loss

## 2022-05-17 ENCOUNTER — Ambulatory Visit: Payer: Medicare Other | Admitting: Primary Care

## 2022-05-17 ENCOUNTER — Encounter: Payer: Self-pay | Admitting: Primary Care

## 2022-05-17 VITALS — BP 130/70 | HR 84 | Temp 97.9°F | Ht 69.0 in | Wt 319.6 lb

## 2022-05-17 DIAGNOSIS — G4733 Obstructive sleep apnea (adult) (pediatric): Secondary | ICD-10-CM | POA: Diagnosis not present

## 2022-05-17 NOTE — Patient Instructions (Addendum)
Orders: Renew CPAP supplies with DME/  Please provide patient with CPAP liners for nasal mask  (you can also look on [email or web address].com for liners)  Follow-up: 1 year with Beth NP   CPAP and BIPAP Information CPAP and BIPAP are methods that use air pressure to keep your airways open and to help you breathe well. CPAP and BIPAP use different amounts of pressure. Your health care provider will tell you whether CPAP or BIPAP would be more helpful for you. CPAP stands for "continuous positive airway pressure." With CPAP, the amount of pressure stays the same while you breathe in (inhale) and out (exhale). BIPAP stands for "bi-level positive airway pressure." With BIPAP, the amount of pressure will be higher when you inhale and lower when you exhale. This allows you to take larger breaths. CPAP or BIPAP may be used in the hospital, or your health care provider may want you to use it at home. You may need to have a sleep study before your health care provider can order a machine for you to use at home. What are the advantages? CPAP or BIPAP can be helpful if you have: Sleep apnea. Chronic obstructive pulmonary disease (COPD). Heart failure. Medical conditions that cause muscle weakness, including muscular dystrophy or amyotrophic lateral sclerosis (ALS). Other problems that cause breathing to be shallow, weak, abnormal, or difficult. CPAP and BIPAP are most commonly used for obstructive sleep apnea (OSA) to keep the airways from collapsing when the muscles relax during sleep. What are the risks? Generally, this is a safe treatment. However, problems may occur, including: Irritated skin or skin sores if the mask does not fit properly. Dry or stuffy nose or nosebleeds. Dry mouth. Feeling gassy or bloated. Sinus or lung infection if the equipment is not cleaned properly. When should CPAP or BIPAP be used? In most cases, the mask only needs to be worn during sleep. Generally, the mask needs to be worn  throughout the night and during any daytime naps. People with certain medical conditions may also need to wear the mask at other times, such as when they are awake. Follow instructions from your health care provider about when to use the machine. What happens during CPAP or BIPAP?  Both CPAP and BIPAP are provided by a small machine with a flexible plastic tube that attaches to a plastic mask that you wear. Air is blown through the mask into your nose or mouth. The amount of pressure that is used to blow the air can be adjusted on the machine. Your health care provider will set the pressure setting and help you find the best mask for you. Tips for using the mask Because the mask needs to be snug, some people feel trapped or closed-in (claustrophobic) when first using the mask. If you feel this way, you may need to get used to the mask. One way to do this is to hold the mask loosely over your nose or mouth and then gradually apply the mask more snugly. You can also gradually increase the amount of time that you use the mask. Masks are available in various types and sizes. If your mask does not fit well, talk with your health care provider about getting a different one. Some common types of masks include: Full face masks, which fit over the mouth and nose. Nasal masks, which fit over the nose. Nasal pillow or prong masks, which fit into the nostrils. If you are using a mask that fits over your nose and you tend  to breathe through your mouth, a chin strap may be applied to help keep your mouth closed. Use a skin barrier to protect your skin as told by your health care provider. Some CPAP and BIPAP machines have alarms that may sound if the mask comes off or develops a leak. If you have trouble with the mask, it is very important that you talk with your health care provider about finding a way to make the mask easier to tolerate. Do not stop using the mask. There could be a negative impact on your health if  you stop using the mask. Tips for using the machine Place your CPAP or BIPAP machine on a secure table or stand near an electrical outlet. Know where the on/off switch is on the machine. Follow instructions from your health care provider about how to set the pressure on your machine and when you should use it. Do not eat or drink while the CPAP or BIPAP machine is on. Food or fluids could get pushed into your lungs by the pressure of the CPAP or BIPAP. For home use, CPAP and BIPAP machines can be rented or purchased through home health care companies. Many different brands of machines are available. Renting a machine before purchasing may help you find out which particular machine works well for you. Your health insurance company may also decide which machine you may get. Keep the CPAP or BIPAP machine and attachments clean. Ask your health care provider for specific instructions. Check the humidifier if you have a dry stuffy nose or nosebleeds. Make sure it is working correctly. Follow these instructions at home: Take over-the-counter and prescription medicines only as told by your health care provider. Ask if you can take sinus medicine if your sinuses are blocked. Do not use any products that contain nicotine or tobacco. These products include cigarettes, chewing tobacco, and vaping devices, such as e-cigarettes. If you need help quitting, ask your health care provider. Keep all follow-up visits. This is important. Contact a health care provider if: You have redness or pressure sores on your head, face, mouth, or nose from the mask or head gear. You have trouble using the CPAP or BIPAP machine. You cannot tolerate wearing the CPAP or BIPAP mask. Someone tells you that you snore even when wearing your CPAP or BIPAP. Get help right away if: You have trouble breathing. You feel confused. Summary CPAP and BIPAP are methods that use air pressure to keep your airways open and to help you breathe  well. If you have trouble with the mask, it is very important that you talk with your health care provider about finding a way to make the mask easier to tolerate. Do not stop using the mask. There could be a negative impact to your health if you stop using the mask. Follow instructions from your health care provider about when to use the machine. This information is not intended to replace advice given to you by your health care provider. Make sure you discuss any questions you have with your health care provider. Document Revised: 10/25/2020 Document Reviewed: 02/25/2020 Elsevier Patient Education  [place].

## 2022-05-17 NOTE — Progress Notes (Unsigned)
$@Patientg$  ID: Michelle Mcmillan, female    DOB: 09-May-1970, 52 y.o.   MRN: [id number]  Chief Complaint  Patient presents with   Follow-up    Wearing cpap nightly- pressure and mask are okay.     Referring provider: Donnie Coffin, MD  HPI: 52 year old female, never smoked. PMH significant for OSA, HTN, GERD, breast cancer s/p mastectomy, morbid obesity. HST 11/26/20 >> AHI 28.1, SpO2 low 72%.   Previous [facility] pulmonary encounter: 04/20/2021 Patient presents today for 3 month follow-up after CPAP start. She is doing well today. No acute complaints. She is 100% compliant with CPAP use. She is sleeping better at night and not as tired during the day. No issues with pressure setting or mask fit other than occasional airleak. She is using a hybrid full face mask size medium. She recently has started to exercise and is working on weight loss.   Airview download 03/19/21-04/17/21 Usage 30/30 days; 100% > 4 hours Average usage 8 hours 24 mins Pressure 5-15cm h20 (14.3cm h20-95%) Airleaks 4.8L/min (95%) AHI 2.4   05/17/2022- interim hx  Patient presents today for annual follow-up for OSA. She is doing well. She still has some issues with mask fit, she has tried several mask but all irritate her . She is currently using nasal cradle mask and seems to do the best with this particular one.  No significant daytime sleepiness No significant snoring    Airview download 04/16/22-05/15/22 Usage 30/30 days; 100% > 4 hours Average usage 8 hours 15 mins Pressure 5-15cm h20 Airleaks 13.5L/min AHI 0.9     Allergies  Allergen Reactions   Nitroglycerin Other (See Comments)    Headache, n/v.    Immunization History  Administered Date(s) Administered   Influenza,inj,Quad PF,6+ Mos 02/10/2018   Influenza-Unspecified 04/30/2022   PFIZER(Purple Top)SARS-COV-2 Vaccination 06/30/2019, 07/25/2019    Past Medical History:  Diagnosis Date   Anemia    H/O   Anemia    Arthritis    RIGHT KNEE   Back  pain    Breast cancer ([place]) 2017   right breast   Cancer ([place])    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)   Carpal tunnel syndrome    Constipation    Depression    Dyspnea    GERD (gastroesophageal reflux disease)    NO MEDS   Headache    migraines   Hypertension    Intractable nausea and vomiting 08/11/2017   Joint pain    Last menstrual period (LMP) > 10 days ago 08/2015   Lower extremity edema    Migraines    Neuropathy    OSA (obstructive sleep apnea)    Personal history of radiation therapy    Prediabetes    SOB (shortness of breath)     Tobacco History: Social History   Tobacco Use  Smoking Status Never  Smokeless Tobacco Never   Counseling given: Not Answered   Outpatient Medications Prior to Visit  Medication Sig Dispense Refill   APPLE CIDER VINEGAR PO Take by mouth. Two tabs daily     buPROPion (WELLBUTRIN XL) 150 MG 24 hr tablet Take 150 mg by mouth daily.     butalbital-aspirin-caffeine (FIORINAL) 50-325-40 MG capsule Take 1 capsule by mouth as needed for headache.     calcium-vitamin D (OSCAL WITH D) 500-200 MG-UNIT TABS tablet Take 1 tablet by mouth 2 (two) times daily. With meals     diclofenac sodium (VOLTAREN) 1 % GEL Apply 4 g topically 4 (four) times daily.  dorzolamide-timolol (COSOPT) 22.3-6.8 MG/ML ophthalmic solution Place 1 drop into both eyes every morning.     ELDERBERRY PO Take 50 mg by mouth daily.     Ferrous Sulfate (IRON) 325 (65 Fe) MG TABS Take 1 tablet (325 mg total) by mouth 2 (two) times daily. 60 each 2   gabapentin (NEURONTIN) 300 MG capsule TAKE 1 CAPSULE BY MOUTH 3  TIMES DAILY AND MAY TAKE 2  CAPSULES BY MOUTH AT  BEDTIME IF MORE BOTHERSOME  AT NIGHT 450 capsule 0   hydrochlorothiazide (MICROZIDE) 12.5 MG capsule Take 1 capsule (12.5 mg total) by mouth daily. 90 capsule 0   HYDROcodone-acetaminophen (NORCO/VICODIN) 5-325 MG tablet Take 1 tablet by mouth every 6 (six) hours as needed for severe pain. 5 tablet 0   ibuprofen (ADVIL) 800 MG  tablet TAKE 1 TABLET BY MOUTH EVERY 8 HOURS WITH FOOD AS NEEDED FOR PAIN     latanoprost (XALATAN) 0.005 % ophthalmic solution Place 1 drop into both eyes at bedtime.     letrozole (FEMARA) 2.5 MG tablet TAKE 1 TABLET BY MOUTH  DAILY 90 tablet 3   Melatonin 5 MG CAPS Take 5 mg by mouth at bedtime.     omeprazole (PRILOSEC) 20 MG capsule Take 1 capsule (20 mg total) by mouth daily. 30 capsule 3   rizatriptan (MAXALT) 10 MG tablet SMARTSIG:1 Tablet(s) By Mouth 1 to 2 Times Daily     topiramate (TOPAMAX) 50 MG tablet One half tab by mouth daily for a week, then one tab by mouth daily. 30 tablet 0   No facility-administered medications prior to visit.      Review of Systems  Review of Systems  Constitutional: Negative.   HENT: Negative.    Respiratory: Negative.    Cardiovascular: Negative.      Physical Exam  BP 130/70 (BP Location: Left Arm, Cuff Size: Large)   Pulse 84   Temp 97.9 F (36.6 C) (Temporal)   Ht 5' 9"$  (1.753 m)   Wt (!) 319 lb 9.6 oz (145 kg)   SpO2 95%   BMI 47.20 kg/m  Physical Exam Constitutional:      Appearance: Normal appearance.  HENT:     Head: Normocephalic and atraumatic.     Mouth/Throat:     Mouth: Mucous membranes are moist.     Pharynx: Oropharynx is clear.  Cardiovascular:     Rate and Rhythm: Normal rate and regular rhythm.  Pulmonary:     Effort: Pulmonary effort is normal.     Breath sounds: Normal breath sounds.  Skin:    General: Skin is warm and dry.  Neurological:     General: No focal deficit present.     Mental Status: She is alert and oriented to person, place, and time. Mental status is at baseline.  Psychiatric:        Mood and Affect: Mood normal.        Behavior: Behavior normal.        Thought Content: Thought content normal.        Judgment: Judgment normal.      Lab Results:  CBC    Component Value Date/Time   WBC 6.3 11/19/2021 1639   RBC 4.48 11/19/2021 1639   HGB 11.6 (L) 11/19/2021 1639   HCT 37.6  11/19/2021 1639   HCT 36.3 06/20/2021 0913   PLT 220 11/19/2021 1639   MCV 83.9 11/19/2021 1639   MCH 25.9 (L) 11/19/2021 1639   MCHC 30.9 11/19/2021 1639  RDW 17.2 (H) 11/19/2021 1639   LYMPHSABS 1.7 09/06/2021 1252   MONOABS 0.4 09/06/2021 1252   EOSABS 0.2 09/06/2021 1252   BASOSABS 0.0 09/06/2021 1252    BMET    Component Value Date/Time   NA 145 (H) 12/31/2021 0845   K 4.3 12/31/2021 0845   CL 104 12/31/2021 0845   CO2 26 12/31/2021 0845   GLUCOSE 86 12/31/2021 0845   GLUCOSE 98 11/19/2021 1639   BUN 23 12/31/2021 0845   CREATININE 0.83 12/31/2021 0845   CALCIUM 9.6 12/31/2021 0845   GFRNONAA >60 11/19/2021 1639   GFRAA >60 01/25/2019 1100    BNP No results found for: "BNP"  ProBNP No results found for: "PROBNP"  Imaging: No results found.   Assessment & Plan:   OSA on CPAP - Well controlled on CPAP. Patient is 100% compliant with use > 4 hours last 30 days. Pressure 5-15cm h20; Residual AHI 0/9/hr - No changes - Renew CPAP supplies with DME/ CPAP liners for nasal mask  - Follow-up 1 year with Beth NP      Martyn Ehrich, NP 05/20/2022

## 2022-05-20 NOTE — Assessment & Plan Note (Signed)
-   Well controlled on CPAP. Patient is 100% compliant with use > 4 hours last 30 days. Pressure 5-15cm h20; Residual AHI 0/9/hr - No changes - Renew CPAP supplies with DME/ CPAP liners for nasal mask  - Follow-up 1 year with Michelle Maize NP

## 2022-05-22 ENCOUNTER — Other Ambulatory Visit: Payer: Self-pay | Admitting: Oncology

## 2022-05-22 ENCOUNTER — Other Ambulatory Visit (INDEPENDENT_AMBULATORY_CARE_PROVIDER_SITE_OTHER): Payer: Self-pay | Admitting: Family Medicine

## 2022-05-22 DIAGNOSIS — I10 Essential (primary) hypertension: Secondary | ICD-10-CM

## 2022-06-03 ENCOUNTER — Encounter (INDEPENDENT_AMBULATORY_CARE_PROVIDER_SITE_OTHER): Payer: Self-pay | Admitting: Family Medicine

## 2022-06-03 ENCOUNTER — Ambulatory Visit (INDEPENDENT_AMBULATORY_CARE_PROVIDER_SITE_OTHER): Payer: Medicare Other | Admitting: Family Medicine

## 2022-06-03 VITALS — BP 141/90 | HR 57 | Temp 97.9°F | Ht 69.0 in | Wt 314.2 lb

## 2022-06-03 DIAGNOSIS — G43909 Migraine, unspecified, not intractable, without status migrainosus: Secondary | ICD-10-CM | POA: Diagnosis not present

## 2022-06-03 DIAGNOSIS — F39 Unspecified mood [affective] disorder: Secondary | ICD-10-CM

## 2022-06-03 DIAGNOSIS — Z6841 Body Mass Index (BMI) 40.0 and over, adult: Secondary | ICD-10-CM

## 2022-06-03 DIAGNOSIS — I10 Essential (primary) hypertension: Secondary | ICD-10-CM | POA: Diagnosis not present

## 2022-06-03 DIAGNOSIS — R7303 Prediabetes: Secondary | ICD-10-CM | POA: Diagnosis not present

## 2022-06-03 NOTE — Progress Notes (Signed)
Office: [phone]  /  Fax: [phone]  WEIGHT SUMMARY AND BIOMETRICS  Weight Lost Since Last Visit: 2lb  No data recorded  Vitals Temp: 97.9 F (36.6 C) BP: (!) 141/90 Pulse Rate: (!) 57 SpO2: 97 %   Anthropometric Measurements Height: '5\' 9"'$  (1.753 m) Weight: (!) 314 lb 3.2 oz (142.5 kg) BMI (Calculated): 46.38 Weight at Last Visit: 316lb Weight Lost Since Last Visit: 2lb Starting Weight: 333lb Total Weight Loss (lbs): 17 lb (7.711 kg) Peak Weight: 358lb   Body Composition  Body Fat %: 53.4 % Fat Mass (lbs): 167.8 lbs Muscle Mass (lbs): 139 lbs Total Body Water (lbs): 107.2 lbs Visceral Fat Rating : 18   Other Clinical Data Fasting: yes Labs: no Today's Visit #: 16 Starting Date: 06/20/21     HPI  Chief Complaint: OBESITY  Michelle Mcmillan is here to discuss her progress with her obesity treatment plan. She is on the the Category 3 Plan and states she is following her eating plan approximately 70% of the time. She states she is walking 20 minutes 4 days per week.   Interval History:  Since last office visit she has been doing well overall. She has lost 2lbs since last visit. She is trying to follow a category 3 plan but has been skipping full lunches due to her busy schedule- Patient states that she is eating meat and vegetables but may eat higher calorie bread. If she goes to [place] she asks for extra [place] so that she is eating sufficient amounts of protein. She has been eating more vegetables and fruit. She is eating proteins regularly. She is feeling much better since cutting out most dairy, especially cheeses. She tried the [employer] skim milk and did not like this. She has had great improvement of her cravings with Topamax over the last month. She has had only a few M&Ms and no potato chips as the craving is no longer there. She changed to eating [facility] and has been eating less popcorn overall.  She is tolerating Ferrous Sulfate '325mg'$  BID well. She  denies any issues with constipation. She has increased her water intake and is drinking over 100oz a day.  She is taking Calcium-Vitamin D 500-'200mg'$  supplementation BID as well.  She has noticed improvement in her knee pain with hydration and weight loss. Patient sometimes forgets to use her cane as her pain has improved so much.   PHYSICAL EXAM:  Blood pressure (!) 141/90, pulse (!) 57, temperature 97.9 F (36.6 C), height '5\' 9"'$  (1.753 m), weight (!) 314 lb 3.2 oz (142.5 kg), SpO2 97 %. Body mass index is 46.4 kg/m.  General: Well Developed, well nourished, and in no acute distress.  HEENT: Normocephalic, atraumatic Skin: Warm and dry, cap RF less 2 sec, good turgor Chest:  Normal excursion, shape, no gross abn Respiratory: speaking in full sentences, no conversational dyspnea NeuroM-Sk: Ambulates w/o assistance, moves * 4 Psych: A and O *3, insight good, mood-full   DIAGNOSTIC DATA REVIEWED:  BMET    Component Value Date/Time   NA 145 (H) 12/31/2021 0845   K 4.3 12/31/2021 0845   CL 104 12/31/2021 0845   CO2 26 12/31/2021 0845   GLUCOSE 86 12/31/2021 0845   GLUCOSE 98 11/19/2021 1639   BUN 23 12/31/2021 0845   CREATININE 0.83 12/31/2021 0845   CALCIUM 9.6 12/31/2021 0845   GFRNONAA >60 11/19/2021 1639   GFRAA >60 01/25/2019 1100   Lab Results  Component Value Date   HGBA1C 6.0 (  H) 12/31/2021   HGBA1C 6.0 (H) 06/20/2021   Lab Results  Component Value Date   INSULIN 11.0 12/31/2021   INSULIN 11.8 06/20/2021   Lab Results  Component Value Date   TSH 2.020 06/20/2021   CBC    Component Value Date/Time   WBC 6.3 11/19/2021 1639   RBC 4.48 11/19/2021 1639   HGB 11.6 (L) 11/19/2021 1639   HCT 37.6 11/19/2021 1639   HCT 36.3 06/20/2021 0913   PLT 220 11/19/2021 1639   MCV 83.9 11/19/2021 1639   MCH 25.9 (L) 11/19/2021 1639   MCHC 30.9 11/19/2021 1639   RDW 17.2 (H) 11/19/2021 1639   Iron Studies    Component Value Date/Time   IRON 49 09/06/2021 1252    IRON 47 06/20/2021 0913   TIBC 273 09/06/2021 1252   TIBC 222 (L) 06/20/2021 0913   FERRITIN 224 09/06/2021 1252   FERRITIN 366 (H) 06/20/2021 0913   IRONPCTSAT 18 09/06/2021 1252   IRONPCTSAT 21 06/20/2021 0913   Lipid Panel     Component Value Date/Time   CHOL 180 12/31/2021 0845   TRIG 55 12/31/2021 0845   HDL 75 12/31/2021 0845   LDLCALC 94 12/31/2021 0845   Hepatic Function Panel     Component Value Date/Time   PROT 6.5 12/31/2021 0845   ALBUMIN 4.1 12/31/2021 0845   AST 24 12/31/2021 0845   ALT 17 12/31/2021 0845   ALKPHOS 89 12/31/2021 0845   BILITOT 0.2 12/31/2021 0845      Component Value Date/Time   TSH 2.020 06/20/2021 0913   Nutritional Lab Results  Component Value Date   VD25OH 48.2 12/31/2021   VD25OH 45.2 06/20/2021     ASSESSMENT AND PLAN  No orders of the defined types were placed in this encounter.   There are no discontinued medications.   No orders of the defined types were placed in this encounter.    TREATMENT PLAN FOR OBESITY:  Recommended Dietary Goals  Michelle Mcmillan is currently in the action stage of change. As such, her goal is to continue weight management plan. She has agreed to continue the Category 3 Plan.  Behavioral Intervention  We discussed the following Behavioral Modification Strategies today: increasing lean protein intake, decreasing simple carbohydrates , increasing vegetables, avoiding skipping meals, and work on meal planning and easy cooking plans.  Additional resources provided today: NA  Recommended Physical Activity Goals  Michelle Mcmillan has been advised to work up to 150 minutes of moderate intensity aerobic activity a week and strengthening exercises 2-3 times per week for cardiovascular health, weight loss maintenance and preservation of muscle mass.   She has agreed to increase physical activity in their day and reduce sedentary time (increase NEAT).    ASSOCIATED CONDITIONS ADDRESSED TODAY   Morbid obesity  (HCC)-start bmi 49.18/date 06/20/21 Assessment: Condition is Stable. She is following category 3 meal plan with intermittent fasting- eating 12PM-8PM. She often eats out for lunch due to her busy schedule. Plan: Continue Category 3 meal plan. Discussed walk at home series on Youtube and encouraged her to try this and increase exercise. Advised her to prep her lunches so that she has something prepared in her cooler when she is on the go.  Essential hypertension Assessment: Condition is Stable. She states that her BP has been well controlled at home between 120s-130s over 70s for the most part. She attributes today's elevated BP of 141/90 to not yet taking her HCTZ this morning.   Plan: Continue HCTZ 12.'5mg'$  daily.  Discussed taking this habitually in the mornings so that she does not forget to take it. Continue diet, lifestyle changes, and weight loss.  Migraine without status migrainosus, not intractable, unspecified migraine type Assessment: Condition is Stable. Started Topamax at last OV. Tolerating Topamax well with significant improvement of cravings. Plan: Continue Topamax '50mg'$  daily. Continue prudent nutritional plan.  Mood Disorder Assessment: Condition is Stable. Emotional eating has improved significantly with Wellbutrin and even more so with Topamax. Plan: Continue Wellbutrin XL '150mg'$ .   Prediabetes Assessment: Condition is Stable. Labs were reviewed. A1c on 12/31/21 was 6.0. Plan: Will recheck A1c, FI, and CMP in 6 weeks.   Follow up as scheduled on 06/24/22 and again in 6 weeks.  She was informed of the importance of frequent follow up visits to maximize her success with intensive lifestyle modifications for her multiple health conditions.   ATTESTASTION STATEMENTS:  Reviewed by clinician on day of visit: allergies, medications, problem list, medical history, surgical history, family history, social history, and previous encounter notes.   Time spent on visit including  pre-visit chart review and post-visit care and charting was *** minutes.    Michelle Mcmillan,Michelle Mcmillan,acting as a Education administrator for [employer], DO.,have documented all relevant documentation on the behalf of Michelle Dance, DO,as directed by  Michelle Dance, DO while in the presence of Michelle Dance, DO.   Michelle Mcmillan, Michelle Dance, DO, have reviewed all documentation for this visit. The documentation on 06/03/22 for the exam, diagnosis, procedures, and orders are all accurate and complete.     Michelle Mcmillan

## 2022-06-04 MED ORDER — TOPIRAMATE 50 MG PO TABS: ORAL_TABLET | ORAL | 0 refills | Status: DC

## 2022-06-04 MED ORDER — HYDROCHLOROTHIAZIDE 12.5 MG PO CAPS: 12.5000 mg | ORAL_CAPSULE | Freq: Every day | ORAL | 0 refills | Status: DC

## 2022-06-06 ENCOUNTER — Encounter: Payer: Self-pay | Admitting: Radiology

## 2022-06-24 ENCOUNTER — Ambulatory Visit (INDEPENDENT_AMBULATORY_CARE_PROVIDER_SITE_OTHER): Payer: Medicare Other | Admitting: Family Medicine

## 2022-06-24 ENCOUNTER — Encounter (INDEPENDENT_AMBULATORY_CARE_PROVIDER_SITE_OTHER): Payer: Self-pay | Admitting: Family Medicine

## 2022-06-24 VITALS — BP 137/83 | HR 63 | Temp 98.5°F | Ht 69.0 in | Wt 314.0 lb

## 2022-06-24 DIAGNOSIS — E7849 Other hyperlipidemia: Secondary | ICD-10-CM

## 2022-06-24 DIAGNOSIS — E559 Vitamin D deficiency, unspecified: Secondary | ICD-10-CM

## 2022-06-24 DIAGNOSIS — G43909 Migraine, unspecified, not intractable, without status migrainosus: Secondary | ICD-10-CM

## 2022-06-24 DIAGNOSIS — I10 Essential (primary) hypertension: Secondary | ICD-10-CM

## 2022-06-24 DIAGNOSIS — R7303 Prediabetes: Secondary | ICD-10-CM | POA: Diagnosis not present

## 2022-06-24 DIAGNOSIS — Z6841 Body Mass Index (BMI) 40.0 and over, adult: Secondary | ICD-10-CM

## 2022-06-24 MED ORDER — TOPIRAMATE 50 MG PO TABS: ORAL_TABLET | ORAL | 0 refills | Status: DC

## 2022-06-24 NOTE — Progress Notes (Signed)
Michelle Mcmillan, D.O.  ABFM, ABOM Specializing in Clinical Bariatric Medicine  Office located at: [address], [place]  [address]     Assessment and Plan:   Orders Placed This Encounter  Procedures   Lipid panel   VITAMIN D 25 Hydroxy (Vit-D Deficiency, Fractures)   Hemoglobin A1c   Insulin, random   Comprehensive metabolic panel   TSH   T4, free   CBC with Differential/Platelet    Medications Discontinued During This Encounter  Medication Reason   topiramate (TOPAMAX) 50 MG tablet Reorder     Meds ordered this encounter  Medications   topiramate (TOPAMAX) 50 MG tablet    Sig: one tab by mouth daily.    Dispense:  30 tablet    Refill:  0    Other hyperlipidemia Assessment: Condition is At goal..  Lab Results  Component Value Date   CHOL 180 12/31/2021   HDL 75 12/31/2021   LDLCALC 94 12/31/2021   TRIG 55 12/31/2021  Her hyperlipedemia is currently diet controlled. She is not on any medication.  Plan: recheck labs today Continue her prudent nutritional plan and continue to advance exercise and cardiovascular fitness as tolerated.  - We will continue routine screening as patient continues to achieve health goals along their weight loss journey    Prediabetes Assessment: Condition is Not optimized. Lab Results  Component Value Date   HGBA1C 6.0 (H) 12/31/2021   HGBA1C 6.0 (H) 06/20/2021   INSULIN 11.0 12/31/2021   INSULIN 11.8 06/20/2021  Her prediabetes is currently being managed by diet. She is not on any medication nor has she been on any in past.   Plan: Will recheck A1c and Insulin today. Continue her prudent nutritional plan and continue to advance exercise and cardiovascular fitness as tolerated.   - Continue to decrease simple carbs/ sugars; increase fiber and proteins -> follow her meal plan.    Vitamin D Deficiency  Assessment: Condition is Improving, but not optimized. Lab Results  Component Value Date   VD25OH 48.2  12/31/2021   VD25OH 45.2 06/20/2021  She is compliant with OTC Vitamin D 600 IU daily. Denies any side effects.  Plan: recheck labs today.  - weight loss will likely improve availability of vitamin D, thus encouraged [name] to continue with meal plan and their weight loss efforts to further improve this condition.  Thus, we will need to monitor levels regularly (every 3-4 mo on average) to keep levels within normal limits and prevent over supplementation.   Essential hypertension Assessment: Condition is Improving, but not optimized. Last 3 blood pressure readings in our office are as follows: BP Readings from Last 3 Encounters:  06/24/22 137/83  06/03/22 (!) 141/90  05/17/22 130/70  No issues with HCTZ 12.5 mg daily. Denies any side effects. She is checking her blood pressure at home and it runs from the 127s-130s/78-80s.  Asymptomatic.  Plan:  Recheck labs. Continue with HCTZ 12.5 mg daily and checking bp at home regularly. - We will continue to monitor closely alongside PCP/ specialists.  Pt reminded to also f/up with those individuals as instructed by them. Continue her prudent nutritional plan with low sodium foods, advance exercise as tolerated    Migraine without status migrainosus, not intractable, unspecified migraine type Assessment: Her migraine headaches have improved.  She has been compliant with Topamax 50 mg daily. She reports that her cravings have been well-controlled, but feels hungry sometimes which is likely due to her skipping meals.  Plan: Continue with Topamax 50 mg daily. Will refill Topamax today.   I stressed the importance of eating all the food on her prudent nutritional plan to reduce hunger- no skipping. We will reassess if she would like to increase the Topamax or add metformin etc in the next office visit as she declines an increase today of topiramate.    TREATMENT PLAN FOR OBESITY: BMI 45.0-49.9, adult (HCC)-46.4 Morbid obesity (HCC)-start bmi  49.18/date 06/20/21. Assessment: Condition is stable. Biometric data collected today, was reviewed with patient.  Fat mass has increased by 2lb. Muscle mass has decreased by 2lb. Total body water has increased by 4lb.  Discussed with pt this can be found in pt's who are eating more carbs/ snacks and/or skipping lean proteins.  Plan: Continue with Category 3 plan with breakfast and lunch options . Additionally, I showed the patient ways to use the food scale. I also recommended her different foods for her snack calories like Fit crunch bars and [employer] protein chips.  Behavioral Intervention Additional resources provided today: category 3 meal plan information, breakfast options, and lunch options Evidence-based interventions for health behavior change were utilized today including the discussion of self monitoring techniques, problem-solving barriers and SMART goal setting techniques.   Regarding patient's less desirable eating habits and patterns, we employed the technique of small changes.  Pt will specifically work on: eating all the protein on her meal plan for next visit.    Recommended Physical Activity Goals Michelle Mcmillan has been advised to work up to 150 minutes of moderate intensity aerobic activity a week and strengthening exercises 2-3 times per week for cardiovascular health, weight loss maintenance and preservation of muscle mass.  She has agreed to Continue current level of physical activity and advance as tolerated.   FOLLOW UP: Return in about 3 weeks (around 07/15/2022). She was informed of the importance of frequent follow up visits to maximize her success with intensive lifestyle modifications for her multiple health conditions.  Michelle Mcmillan is aware that we will review all of her lab results at our next visit.  She is aware that if anything is critical/ life threatening with the results, we will be contacting her via [place] prior to the office visit to discuss management.     Subjective:   Chief complaint: Obesity Michelle Mcmillan is here to discuss her progress with her obesity treatment plan. She is on the the Category 3 Plan and states she is following her eating plan approximately 75% of the time. She states she is walking 15-20 minutes 4 days per week.  Interval History:  Michelle Mcmillan is here for a follow up office visit. Since last office visit she has mostly been eating the foods on the meal plan. Sometimes she skips meals when she is busy. Yesterday, for lunch she ate a wrap with 6 oz of grilled chicken. For snacks, she had skinny Popcorn and a ice cream bar similar to the [employer] bars.  We reviewed her meal plan and all questions were answered. Patient's food recall appears to be accurate and consistent with what is on plan when she is following it.   Review of Systems:  Pertinent positives were addressed with patient today.  Weight Summary and Biometrics   Weight Lost Since Last Visit: 0  Weight Gained Since Last Visit: 0    Vitals Temp: 98.5 F (36.9 C) BP: 137/83 Pulse Rate: 63 SpO2: 100 %   Anthropometric Measurements Height: 5\' 9"  (1.753 m) Weight: (!) 314 lb (142.4 kg)  BMI (Calculated): 46.35 Weight at Last Visit: 314lb Weight Lost Since Last Visit: 0 Weight Gained Since Last Visit: 0 Starting Weight: 333lb Total Weight Loss (lbs): 17 lb (7.711 kg) Peak Weight: 358lb   Body Composition  Body Fat %: 54.4 % Fat Mass (lbs): 169.8 lbs Muscle Mass (lbs): 137 lbs Total Body Water (lbs): 111.2 lbs Visceral Fat Rating : 19   Other Clinical Data Fasting: yes Labs: no Today's Visit #: 17 Starting Date: 06/20/21    Objective:   PHYSICAL EXAM:  Blood pressure 137/83, pulse 63, temperature 98.5 F (36.9 C), height 5\' 9"  (1.753 m), weight (!) 314 lb (142.4 kg), SpO2 100 %. Body mass index is 46.37 kg/m.  General: Well Developed, well nourished, and in no acute distress.  HEENT: Normocephalic, atraumatic Skin: Warm and  dry, cap RF less 2 sec, good turgor Chest:  Normal excursion, shape, no gross abn Respiratory: speaking in full sentences, no conversational dyspnea NeuroM-Sk: Ambulates w/o assistance, moves * 4 Psych: A and O *3, insight good, mood-full  DIAGNOSTIC DATA REVIEWED:  BMET    Component Value Date/Time   NA 145 (H) 12/31/2021 0845   K 4.3 12/31/2021 0845   CL 104 12/31/2021 0845   CO2 26 12/31/2021 0845   GLUCOSE 86 12/31/2021 0845   GLUCOSE 98 11/19/2021 1639   BUN 23 12/31/2021 0845   CREATININE 0.83 12/31/2021 0845   CALCIUM 9.6 12/31/2021 0845   GFRNONAA >60 11/19/2021 1639   GFRAA >60 01/25/2019 1100   Lab Results  Component Value Date   HGBA1C 6.0 (H) 12/31/2021   HGBA1C 6.0 (H) 06/20/2021   Lab Results  Component Value Date   INSULIN 11.0 12/31/2021   INSULIN 11.8 06/20/2021   Lab Results  Component Value Date   TSH 2.020 06/20/2021   CBC    Component Value Date/Time   WBC 6.3 11/19/2021 1639   RBC 4.48 11/19/2021 1639   HGB 11.6 (L) 11/19/2021 1639   HCT 37.6 11/19/2021 1639   HCT 36.3 06/20/2021 0913   PLT 220 11/19/2021 1639   MCV 83.9 11/19/2021 1639   MCH 25.9 (L) 11/19/2021 1639   MCHC 30.9 11/19/2021 1639   RDW 17.2 (H) 11/19/2021 1639   Iron Studies    Component Value Date/Time   IRON 49 09/06/2021 1252   IRON 47 06/20/2021 0913   TIBC 273 09/06/2021 1252   TIBC 222 (L) 06/20/2021 0913   FERRITIN 224 09/06/2021 1252   FERRITIN 366 (H) 06/20/2021 0913   IRONPCTSAT 18 09/06/2021 1252   IRONPCTSAT 21 06/20/2021 0913   Lipid Panel     Component Value Date/Time   CHOL 180 12/31/2021 0845   TRIG 55 12/31/2021 0845   HDL 75 12/31/2021 0845   LDLCALC 94 12/31/2021 0845   Hepatic Function Panel     Component Value Date/Time   PROT 6.5 12/31/2021 0845   ALBUMIN 4.1 12/31/2021 0845   AST 24 12/31/2021 0845   ALT 17 12/31/2021 0845   ALKPHOS 89 12/31/2021 0845   BILITOT 0.2 12/31/2021 0845      Component Value Date/Time   TSH 2.020  06/20/2021 0913   Nutritional Lab Results  Component Value Date   VD25OH 48.2 12/31/2021   VD25OH 45.2 06/20/2021    Attestations:   Reviewed by clinician on day of visit: allergies, medications, problem list, medical history, surgical history, family history, social history, and previous encounter notes.    I,Special Puri,acting as a Education administrator for [employer], DO.,have documented all  relevant documentation on the behalf of Michelle Dance, DO,as directed by  Michelle Dance, DO while in the presence of Michelle Dance, DO.   I, Michelle Dance, DO, have reviewed all documentation for this visit. The documentation on 06/24/22 for the exam, diagnosis, procedures, and orders are all accurate and complete.

## 2022-06-25 LAB — LIPID PANEL
Chol/HDL Ratio: 3 ratio (ref 0.0–4.4)
Cholesterol, Total: 180 mg/dL (ref 100–199)
HDL: 60 mg/dL (ref >39)
LDL Chol Calc (NIH): 110 mg/dL — ABNORMAL HIGH (ref 0–99)
Triglycerides: 50 mg/dL (ref 0–149)
VLDL Cholesterol Cal: 10 mg/dL (ref 5–40)

## 2022-06-25 LAB — CBC WITH DIFFERENTIAL/PLATELET
Basophils Absolute: 0 10*3/uL (ref 0.0–0.2)
Basos: 1 %
EOS (ABSOLUTE): 0.1 10*3/uL (ref 0.0–0.4)
Eos: 2 %
Hematocrit: 37.3 % (ref 34.0–46.6)
Hemoglobin: 11.7 g/dL (ref 11.1–15.9)
Immature Grans (Abs): 0 10*3/uL (ref 0.0–0.1)
Immature Granulocytes: 0 %
Lymphocytes Absolute: 1.5 10*3/uL (ref 0.7–3.1)
Lymphs: 28 %
MCH: 25.8 pg — ABNORMAL LOW (ref 26.6–33.0)
MCHC: 31.4 g/dL — ABNORMAL LOW (ref 31.5–35.7)
MCV: 82 fL (ref 79–97)
Monocytes Absolute: 0.3 10*3/uL (ref 0.1–0.9)
Monocytes: 6 %
Neutrophils Absolute: 3.4 10*3/uL (ref 1.4–7.0)
Neutrophils: 63 %
Platelets: 220 10*3/uL (ref 150–450)
RBC: 4.54 x10E6/uL (ref 3.77–5.28)
RDW: 16.4 % — ABNORMAL HIGH (ref 11.7–15.4)
WBC: 5.5 10*3/uL (ref 3.4–10.8)

## 2022-06-25 LAB — COMPREHENSIVE METABOLIC PANEL
ALT: 15 IU/L (ref 0–32)
AST: 20 IU/L (ref 0–40)
Albumin/Globulin Ratio: 1.9 (ref 1.2–2.2)
Albumin: 4 g/dL (ref 3.8–4.9)
Alkaline Phosphatase: 95 IU/L (ref 44–121)
BUN/Creatinine Ratio: 16 (ref 9–23)
BUN: 15 mg/dL (ref 6–24)
Bilirubin Total: 0.3 mg/dL (ref 0.0–1.2)
CO2: 26 mmol/L (ref 20–29)
Calcium: 9.1 mg/dL (ref 8.7–10.2)
Chloride: 105 mmol/L (ref 96–106)
Creatinine, Ser: 0.96 mg/dL (ref 0.57–1.00)
Globulin, Total: 2.1 g/dL (ref 1.5–4.5)
Glucose: 93 mg/dL (ref 70–99)
Potassium: 3.5 mmol/L (ref 3.5–5.2)
Sodium: 144 mmol/L (ref 134–144)
Total Protein: 6.1 g/dL (ref 6.0–8.5)
eGFR: 71 mL/min/{1.73_m2} (ref >59)

## 2022-06-25 LAB — T4, FREE: Free T4: 1.22 ng/dL (ref 0.82–1.77)

## 2022-06-25 LAB — HEMOGLOBIN A1C
Est. average glucose Bld gHb Est-mCnc: 134 mg/dL
Hgb A1c MFr Bld: 6.3 % — ABNORMAL HIGH (ref 4.8–5.6)

## 2022-06-25 LAB — INSULIN, RANDOM: INSULIN: 12.1 u[IU]/mL (ref 2.6–24.9)

## 2022-06-25 LAB — TSH: TSH: 1 u[IU]/mL (ref 0.450–4.500)

## 2022-06-25 LAB — VITAMIN D 25 HYDROXY (VIT D DEFICIENCY, FRACTURES): Vit D, 25-Hydroxy: 47.2 ng/mL (ref 30.0–100.0)

## 2022-07-04 ENCOUNTER — Ambulatory Visit: Payer: Medicare Other | Admitting: Nurse Practitioner

## 2022-07-18 ENCOUNTER — Ambulatory Visit (INDEPENDENT_AMBULATORY_CARE_PROVIDER_SITE_OTHER): Payer: Medicare Other | Admitting: Family Medicine

## 2022-07-18 ENCOUNTER — Encounter (INDEPENDENT_AMBULATORY_CARE_PROVIDER_SITE_OTHER): Payer: Self-pay | Admitting: Family Medicine

## 2022-07-18 VITALS — BP 117/78 | HR 65 | Temp 98.6°F | Ht 69.0 in | Wt 308.2 lb

## 2022-07-18 DIAGNOSIS — E7849 Other hyperlipidemia: Secondary | ICD-10-CM | POA: Diagnosis not present

## 2022-07-18 DIAGNOSIS — E559 Vitamin D deficiency, unspecified: Secondary | ICD-10-CM | POA: Diagnosis not present

## 2022-07-18 DIAGNOSIS — I10 Essential (primary) hypertension: Secondary | ICD-10-CM

## 2022-07-18 DIAGNOSIS — R7303 Prediabetes: Secondary | ICD-10-CM | POA: Diagnosis not present

## 2022-07-18 DIAGNOSIS — G43909 Migraine, unspecified, not intractable, without status migrainosus: Secondary | ICD-10-CM

## 2022-07-18 DIAGNOSIS — Z6841 Body Mass Index (BMI) 40.0 and over, adult: Secondary | ICD-10-CM

## 2022-07-18 MED ORDER — TOPIRAMATE 50 MG PO TABS: ORAL_TABLET | ORAL | 0 refills | Status: DC

## 2022-07-18 NOTE — Progress Notes (Signed)
Michelle Mcmillan, D.O.  ABFM, ABOM Specializing in Clinical Bariatric Medicine  Office located at: [address] [place], [place]  [address]     Assessment and Plan:   No orders of the defined types were placed in this encounter.   Medications Discontinued During This Encounter  Medication Reason   topiramate (TOPAMAX) 50 MG tablet Reorder     Meds ordered this encounter  Medications   topiramate (TOPAMAX) 50 MG tablet    Sig: one tab by mouth daily.    Dispense:  30 tablet    Refill:  0     Other hyperlipidemia Assessment: Condition is At goal.. Labs were reviewed.Her hyperlipedemia is currently diet controlled. She is not on any medication  Lab Results  Component Value Date   CHOL 180 06/24/2022   HDL 60 06/24/2022   LDLCALC 110 (H) 06/24/2022   TRIG 50 06/24/2022   CHOLHDL 3.0 06/24/2022   Last lipid panel as above.  Plan: Advised to increase exercise and lean protein. Michelle Mcmillan agrees to continue with meds and/or our treatment plan of a heart-heathy, low cholesterol meal plan - Cardiovascular risk and specific lipid/LDL goals reviewed. - We extensively discussed several lifestyle modifications today and she will continue to work on diet, exercise and weight loss efforts.  - I stressed the importance that patient continue with our prudent nutritional plan that is low in saturated and trans fats, and low in fatty carbs to improve these numbers.  - We recommend: aerobic activity with eventual goal of a minimum of 150+ min wk plus 2 days/ week of resistance or strength training.   - We will continue routine screening as patient continues to achieve health goals along their weight loss journey    Prediabetes Assessment: Condition is Worsening.. Labs were reviewed.  Lab Results  Component Value Date   HGBA1C 6.3 (H) 06/24/2022   HGBA1C 6.0 (H) 12/31/2021   HGBA1C 6.0 (H) 06/20/2021   INSULIN 12.1 06/24/2022   INSULIN 11.0 12/31/2021   INSULIN 11.8  06/20/2021    Plan:Continue her prudent nutritional plan and continue to advance exercise and cardiovascular fitness as tolerated.   - Continue to decrease simple carbs/ sugars;  - I counseled patient on pathophysiology of the disease process of I.R. and Pre-DM.  - Stressed importance of dietary and lifestyle modifications to result in weight loss as first line txmnt - In addition, we discussed the risks and benefits of various medication options which can help [place] in the management of this disease process as well as with weight loss.  Will consider starting one of these meds in future as we will focus on prudent nutritional plan at this time.  - Continue to decrease simple carbs/ sugars; increase fiber and proteins -> follow her meal plan.   - Explained role of simple carbs and insulin levels on hunger and cravings - Handouts provided at pt's request after education provided.  All concerns/questions addressed.   - Anticipatory guidance given.   Michelle Mcmillan will continue to work on weight loss, exercise, via their meal plan we devised to help decrease the risk of progressing to diabetes.    Vitamin D deficiency Assessment: Condition is Not at goal.. Labs were reviewed. She is not compliant with OTC Vitamin D 600 IU daily. Denies any side effects  Lab Results  Component Value Date   VD25OH 47.2 06/24/2022   VD25OH 48.2 12/31/2021   VD25OH 45.2 06/20/2021   Plan: Start OTC Vitamin D  600 IU daily. Advised to increase water intake. - I discussed the importance of vitamin D to the patient's health and well-being as well as to their ability to lose weight.  - I reviewed possible symptoms of low Vitamin D:  low energy, depressed mood, muscle aches, joint aches, osteoporosis etc. with patient - It has been show that administration of vitamin D supplementation leads to improved satiety and a decrease in inflammatory markers.  Hence, low Vitamin D levels may be linked to an increased risk of  cardiovascular events and even increased risk of cancers- such as colon and breast. - ideal vitamin D levels reviewed with patient   - Informed patient this may be a lifelong thing, and she was encouraged to continue to take the medicine until told otherwise.    - weight loss will likely improve availability of vitamin D, thus encouraged Naryiah to continue with meal plan and their weight loss efforts to further improve this condition.  Thus, we will need to monitor levels regularly (every 3-4 mo on average) to keep levels within normal limits and prevent over supplementation. - pt's questions and concerns regarding this condition addressed.    Essential hypertension Assessment: Condition is At goal.. Labs were reviewed. She is compliant with HCTZ 12.5 mg daily. Denies any side effects. She reports drinking 1 litter of water daily.  Last 3 blood pressure readings in our office are as follows: BP Readings from Last 3 Encounters:  07/18/22 117/78  06/24/22 137/83  06/03/22 (!) 141/90   The 10-year ASCVD risk score (Arnett DK, et al., 2019) is: 2.3%  Lab Results  Component Value Date   CREATININE 0.96 06/24/2022     Plan:Continue with HCTZ 12.5 mg daily and checking bp at home regularly. Advised to increase water intake. BP is at goal today.  Counseled Michelle Mcmillan on pathophysiology of disease and discussed treatment plan, which always includes dietary and lifestyle modification as first line.  Lifestyle changes such as following our low salt, heart healthy meal plan and engaging in a regular exercise program discussed  - Avoid buying foods that are: processed, frozen, or prepackaged to avoid excess salt. - Ambulatory blood pressure monitoring encouraged.  Reminded patient that if they ever feel poorly in any way, to check their blood pressure and pulse as well. - We will continue to monitor closely alongside PCP/ specialists.  Pt reminded to also f/up with those individuals as instructed  by them.  - We will continue to monitor symptoms as they relate to the her weight loss journey.   Migraine without status migrainosus, not intractable, unspecified migraine type Assessment: Her migraine headaches have improved with Topamax 50 mg daily and states she is good on current dosage.  Plan: Continue with Topamax 50 mg daily. Will refill Topamax today.   I stressed the importance of eating all the food on her prudent nutritional plan to reduce hunger- no skipping.   TREATMENT PLAN FOR OBESITY: BMI 45.0-49.9, adult (HCC)-46.4 Morbid obesity (HCC)-start bmi 49.18/date 06/20/21 Assessment: Condition is docourse: improving. Biometric data collected today, was reviewed with patient.  Fat mass has decreased by 2.8lb. Muscle mass has decreased by 3lb.  Plan: Continue Wellbutrin 150 mg daily. Advised to increase exercise.  Michelle Mcmillan is currently in the action stage of change. As such, her goal is to continue weight management plan. Michelle Mcmillan will work on healthier eating habits and try their best to follow the Continue with Category 3 plan with breakfast and lunch options .  best they can.   Behavioral Intervention Additional resources provided today: category 3 meal plan information Evidence-based interventions for health behavior change were utilized today including the discussion of self monitoring techniques, problem-solving barriers and SMART goal setting techniques.   Regarding patient's less desirable eating habits and patterns, we employed the technique of small changes.  Pt will specifically work on: follow meal plan 80% and increase water intake by 1/2 litter and increase exercise for next visit.    Recommended Physical Activity Goals Michelle Mcmillan has been advised to work up to 150 minutes of moderate intensity aerobic activity a week and strengthening exercises 2-3 times per week for cardiovascular health, weight loss maintenance and preservation of muscle mass.  She has agreed to Think  about ways to increase physical activity  Pharmacotherapy We discussed various medication options to help Michelle Mcmillan with her weight loss efforts and we both agreed to continue Topamax 50 mg daily.   FOLLOW UP: Return in about 3 weeks (around 08/08/2022). She was informed of the importance of frequent follow up visits to maximize her success with intensive lifestyle modifications for her multiple health conditions.   Subjective:   Chief complaint: Obesity Michelle Mcmillan is here to discuss her progress with her obesity treatment plan. She is on the the Category 3 Plan with breakfast and lunch options and states she is following her eating plan approximately 60% of the time. She states she is exercising 20 minutes of walking 4 days per week.  Interval History:  Michelle Mcmillan is here for a follow up office visit. Since last office visit she reports feeling better overall. She states that she ate all the required amount of protein despite fasting for 5 days. She reports that her hunger and cravings have been well controlled. She also states her energy levels have been improving.   We reviewed her meal plan and all questions were answered. Patient's food recall appears to be accurate and consistent with what is on plan when she is following it. When eating on plan, her hunger and cravings are well controlled.      Pharmacotherapy for weight loss: She is currently taking Topamax for medical weight loss.  Denies side effects.    Review of Systems:  Pertinent positives were addressed with patient today.   Weight Summary and Biometrics   Weight Lost Since Last Visit: 6lb  Weight Gained Since Last Visit: 0    Vitals Temp: 98.6 F (37 C) BP: 117/78 Pulse Rate: 65 SpO2: 98 %   Anthropometric Measurements Height: 5\' 9"  (1.753 m) Weight: (!) 308 lb 3.2 oz (139.8 kg) BMI (Calculated): 45.49 Weight at Last Visit: 314lb Weight Lost Since Last Visit: 6lb Weight Gained Since Last Visit: 0 Starting  Weight: 333lb Total Weight Loss (lbs): 23 lb (10.4 kg) Peak Weight: 358lb   Body Composition  Body Fat %: 54.2 % Fat Mass (lbs): 167 lbs Muscle Mass (lbs): 134 lbs Visceral Fat Rating : 18   Other Clinical Data Fasting: no Labs: no Today's Visit #: 18 Starting Date: 06/20/21     Objective:   PHYSICAL EXAM: Blood pressure 117/78, pulse 65, temperature 98.6 F (37 C), height 5\' 9"  (1.753 m), weight (!) 308 lb 3.2 oz (139.8 kg), SpO2 98 %. Body mass index is 45.51 kg/m.  General: Well Developed, well nourished, and in no acute distress.  HEENT: Normocephalic, atraumatic Skin: Warm and dry, cap RF less 2 sec, good turgor Chest:  Normal excursion, shape, no gross abn Respiratory: speaking in full  sentences, no conversational dyspnea NeuroM-Sk: Ambulates w/o assistance, moves * 4 Psych: A and O *3, insight good, mood-full  DIAGNOSTIC DATA REVIEWED:  BMET    Component Value Date/Time   NA 144 06/24/2022 1047   K 3.5 06/24/2022 1047   CL 105 06/24/2022 1047   CO2 26 06/24/2022 1047   GLUCOSE 93 06/24/2022 1047   GLUCOSE 98 11/19/2021 1639   BUN 15 06/24/2022 1047   CREATININE 0.96 06/24/2022 1047   CALCIUM 9.1 06/24/2022 1047   GFRNONAA >60 11/19/2021 1639   GFRAA >60 01/25/2019 1100   Lab Results  Component Value Date   HGBA1C 6.3 (H) 06/24/2022   HGBA1C 6.0 (H) 06/20/2021   Lab Results  Component Value Date   INSULIN 12.1 06/24/2022   INSULIN 11.8 06/20/2021   Lab Results  Component Value Date   TSH 1.000 06/24/2022   CBC    Component Value Date/Time   WBC 5.5 06/24/2022 1047   WBC 6.3 11/19/2021 1639   RBC 4.54 06/24/2022 1047   RBC 4.48 11/19/2021 1639   HGB 11.7 06/24/2022 1047   HCT 37.3 06/24/2022 1047   PLT 220 06/24/2022 1047   MCV 82 06/24/2022 1047   MCH 25.8 (L) 06/24/2022 1047   MCH 25.9 (L) 11/19/2021 1639   MCHC 31.4 (L) 06/24/2022 1047   MCHC 30.9 11/19/2021 1639   RDW 16.4 (H) 06/24/2022 1047   Iron Studies    Component  Value Date/Time   IRON 49 09/06/2021 1252   IRON 47 06/20/2021 0913   TIBC 273 09/06/2021 1252   TIBC 222 (L) 06/20/2021 0913   FERRITIN 224 09/06/2021 1252   FERRITIN 366 (H) 06/20/2021 0913   IRONPCTSAT 18 09/06/2021 1252   IRONPCTSAT 21 06/20/2021 0913   Lipid Panel     Component Value Date/Time   CHOL 180 06/24/2022 1047   TRIG 50 06/24/2022 1047   HDL 60 06/24/2022 1047   CHOLHDL 3.0 06/24/2022 1047   LDLCALC 110 (H) 06/24/2022 1047   Hepatic Function Panel     Component Value Date/Time   PROT 6.1 06/24/2022 1047   ALBUMIN 4.0 06/24/2022 1047   AST 20 06/24/2022 1047   ALT 15 06/24/2022 1047   ALKPHOS 95 06/24/2022 1047   BILITOT 0.3 06/24/2022 1047      Component Value Date/Time   TSH 1.000 06/24/2022 1047   Nutritional Lab Results  Component Value Date   VD25OH 47.2 06/24/2022   VD25OH 48.2 12/31/2021   VD25OH 45.2 06/20/2021    Attestations:   Reviewed by clinician on day of visit: allergies, medications, problem list, medical history, surgical history, family history, social history, and previous encounter notes.    I,Safa M Kadhim,acting as a scribe for [employer], DO.,have documented all relevant documentation on the behalf of Thomasene Lot, DO,as directed by  Thomasene Lot, DO while in the presence of Thomasene Lot, DO.   I, Thomasene Lot, DO, have reviewed all documentation for this visit. The documentation on 07/18/22 for the exam, diagnosis, procedures, and orders are all accurate and complete.

## 2022-08-07 ENCOUNTER — Encounter (INDEPENDENT_AMBULATORY_CARE_PROVIDER_SITE_OTHER): Payer: Self-pay | Admitting: Family Medicine

## 2022-08-07 ENCOUNTER — Ambulatory Visit (INDEPENDENT_AMBULATORY_CARE_PROVIDER_SITE_OTHER): Payer: Medicare Other | Admitting: Family Medicine

## 2022-08-07 VITALS — BP 132/85 | HR 58 | Temp 97.9°F | Ht 69.0 in | Wt 304.0 lb

## 2022-08-07 DIAGNOSIS — E559 Vitamin D deficiency, unspecified: Secondary | ICD-10-CM | POA: Diagnosis not present

## 2022-08-07 DIAGNOSIS — G43909 Migraine, unspecified, not intractable, without status migrainosus: Secondary | ICD-10-CM | POA: Diagnosis not present

## 2022-08-07 DIAGNOSIS — Z6841 Body Mass Index (BMI) 40.0 and over, adult: Secondary | ICD-10-CM

## 2022-08-07 DIAGNOSIS — R7303 Prediabetes: Secondary | ICD-10-CM

## 2022-08-07 MED ORDER — TOPIRAMATE 50 MG PO TABS: ORAL_TABLET | ORAL | 0 refills | Status: DC

## 2022-08-07 NOTE — Progress Notes (Signed)
Michelle Mcmillan, D.O.  ABFM, ABOM Specializing in Clinical Bariatric Medicine  Office located at: [address] [place], [place]  [address]     Assessment and Plan:   No orders of the defined types were placed in this encounter.   Medications Discontinued During This Encounter  Medication Reason   topiramate (TOPAMAX) 50 MG tablet Reorder     Meds ordered this encounter  Medications   topiramate (TOPAMAX) 50 MG tablet    Sig: one tab by mouth daily.    Dispense:  30 tablet    Refill:  0      Prediabetes Assessment: Condition is Not optimized. Lab Results  Component Value Date   HGBA1C 6.3 (H) 06/24/2022   HGBA1C 6.0 (H) 12/31/2021   HGBA1C 6.0 (H) 06/20/2021   INSULIN 12.1 06/24/2022   INSULIN 11.0 12/31/2021   INSULIN 11.8 06/20/2021  - Patient is not on any prediabetic medication. This is diet controlled - Her hunger and cravings are controlled when eating on plan.   Plan - Michelle Mcmillan will continue to work on weight loss, exercise, via their meal plan we devised to help decrease the risk of progressing to diabetes.  - I again stressed the importance of eating all the food on her prudent nutritional plan-no skipping.    Migraine without status migrainosus, not intractable, unspecified migraine type Assessment: Condition is stable. - Migraines are stable. - She is compliant with Topamax 50 mg daily. Denies any side effects.  Plan: - Continue with med. Will refill Topamax today.   - Will continue to monitor condition as it relates to her weight loss journey.    Vitamin D deficiency Assessment: Condition is not quite at goal.  Lab Results  Component Value Date   VD25OH 47.2 06/24/2022   VD25OH 48.2 12/31/2021   VD25OH 45.2 06/20/2021  - She reports good compliance and tolerance with OTC Vitamin D 600 IU daily. Denies any side effects.  Plan: - Continue with OTC med.  - weight loss will likely improve availability of vitamin D, thus encouraged  Michelle Mcmillan to continue with meal plan and their weight loss efforts to further improve this condition.    TREATMENT PLAN FOR OBESITY: BMI 45.0-49.9, adult (HCC)-44.87 Morbid obesity (HCC)-start bmi 49.18/date 06/20/21 Assessment: Condition is improving, but not optimized.  Biometric data collected today, was reviewed with patient.  Fat mass has decreased by 1.8 lb. Muscle mass has decreased by 2 lb.  Plan:  - Michelle Mcmillan will work on healthier eating habits and continue the Category 3 meal plan with breakfast and lunch options.  - I emphasized the importance of not skipping meals because this is likely to slow down her weight loss. -  I recommended her too eat multiple small meals a day in order to consume all the calories and protein on the meal plan.  - I explained the importance of protein for controlling hunger and cravings.  - Counseled patient on the importance of putting herself first especially during times of stress.  Behavioral Intervention Additional resources provided today: patient declined Evidence-based interventions for health behavior change were utilized today including the discussion of self monitoring techniques, problem-solving barriers and SMART goal setting techniques.   Regarding patient's less desirable eating habits and patterns, we employed the technique of small changes.  Pt will specifically work on:  finding ways to eat all the protein on her meal plan for next visit.    Recommended Physical Activity Goals Michelle Mcmillan has been advised to  work up to 150 minutes of moderate intensity aerobic activity a week and strengthening exercises 2-3 times per week for cardiovascular health, weight loss maintenance and preservation of muscle mass.  She has agreed to Continue current level of physical activity   FOLLOW UP: Return in about 4 weeks (around 09/04/2022). She was informed of the importance of frequent follow up visits to maximize her success with intensive lifestyle  modifications for her multiple health conditions.   Subjective:   Chief complaint: Obesity Michelle Mcmillan is here to discuss her progress with her obesity treatment plan. She is on the the Category 3 Plan with breakfast and lunch options and states she is following her eating plan approximately 60% of the time. She states she is not exercising.   Interval History:  Michelle Mcmillan is here for a follow up office visit.  Since last office visit: - Struggled with eating lunch because she was busy/stressed with school and family issues.  - Finds it difficult to make herself a priority.  - Endorses that since starting the program, eating all the food on the meal plan has been very difficult for her.  Review of Systems:  Pertinent positives were addressed with patient today.  Weight Summary and Biometrics   Weight Lost Since Last Visit: 4 lb  Weight Gained Since Last Visit: 0    Vitals Temp: 97.9 F (36.6 C) BP: 132/85 Pulse Rate: (!) 58 SpO2: 97 %   Anthropometric Measurements Height: 5\' 9"  (1.753 m) Weight: (!) 304 lb (137.9 kg) BMI (Calculated): 44.87 Weight at Last Visit: 308 lb Weight Lost Since Last Visit: 4 lb Weight Gained Since Last Visit: 0 Starting Weight: 333 lb Total Weight Loss (lbs): 27 lb (12.2 kg) Peak Weight: 358 lb   Body Composition  Body Fat %: 54.3 % Fat Mass (lbs): 165.2 lbs Muscle Mass (lbs): 132 lbs Visceral Fat Rating : 18   Other Clinical Data Fasting: No Labs: No Today's Visit #: 19 Starting Date: 06/20/21     Objective:   PHYSICAL EXAM: Blood pressure 132/85, pulse (!) 58, temperature 97.9 F (36.6 C), height 5\' 9"  (1.753 m), weight (!) 304 lb (137.9 kg), SpO2 97 %. Body mass index is 44.89 kg/m.  General: Well Developed, well nourished, and in no acute distress.  HEENT: Normocephalic, atraumatic Skin: Warm and dry, cap RF less 2 sec, good turgor Chest:  Normal excursion, shape, no gross abn Respiratory: speaking in full  sentences, no conversational dyspnea NeuroM-Sk: Ambulates w/o assistance, moves * 4 Psych: A and O *3, insight good, mood-full  DIAGNOSTIC DATA REVIEWED:  BMET    Component Value Date/Time   NA 144 06/24/2022 1047   K 3.5 06/24/2022 1047   CL 105 06/24/2022 1047   CO2 26 06/24/2022 1047   GLUCOSE 93 06/24/2022 1047   GLUCOSE 98 11/19/2021 1639   BUN 15 06/24/2022 1047   CREATININE 0.96 06/24/2022 1047   CALCIUM 9.1 06/24/2022 1047   GFRNONAA >60 11/19/2021 1639   GFRAA >60 01/25/2019 1100   Lab Results  Component Value Date   HGBA1C 6.3 (H) 06/24/2022   HGBA1C 6.0 (H) 06/20/2021   Lab Results  Component Value Date   INSULIN 12.1 06/24/2022   INSULIN 11.8 06/20/2021   Lab Results  Component Value Date   TSH 1.000 06/24/2022   CBC    Component Value Date/Time   WBC 5.5 06/24/2022 1047   WBC 6.3 11/19/2021 1639   RBC 4.54 06/24/2022 1047   RBC 4.48 11/19/2021 1639  HGB 11.7 06/24/2022 1047   HCT 37.3 06/24/2022 1047   PLT 220 06/24/2022 1047   MCV 82 06/24/2022 1047   MCH 25.8 (L) 06/24/2022 1047   MCH 25.9 (L) 11/19/2021 1639   MCHC 31.4 (L) 06/24/2022 1047   MCHC 30.9 11/19/2021 1639   RDW 16.4 (H) 06/24/2022 1047   Iron Studies    Component Value Date/Time   IRON 49 09/06/2021 1252   IRON 47 06/20/2021 0913   TIBC 273 09/06/2021 1252   TIBC 222 (L) 06/20/2021 0913   FERRITIN 224 09/06/2021 1252   FERRITIN 366 (H) 06/20/2021 0913   IRONPCTSAT 18 09/06/2021 1252   IRONPCTSAT 21 06/20/2021 0913   Lipid Panel     Component Value Date/Time   CHOL 180 06/24/2022 1047   TRIG 50 06/24/2022 1047   HDL 60 06/24/2022 1047   CHOLHDL 3.0 06/24/2022 1047   LDLCALC 110 (H) 06/24/2022 1047   Hepatic Function Panel     Component Value Date/Time   PROT 6.1 06/24/2022 1047   ALBUMIN 4.0 06/24/2022 1047   AST 20 06/24/2022 1047   ALT 15 06/24/2022 1047   ALKPHOS 95 06/24/2022 1047   BILITOT 0.3 06/24/2022 1047      Component Value Date/Time   TSH  1.000 06/24/2022 1047   Nutritional Lab Results  Component Value Date   VD25OH 47.2 06/24/2022   VD25OH 48.2 12/31/2021   VD25OH 45.2 06/20/2021    Attestations:   Reviewed by clinician on day of visit: allergies, medications, problem list, medical history, surgical history, family history, social history, and previous encounter notes.   I,Special Puri,acting as a Neurosurgeon for [employer], DO.,have documented all relevant documentation on the behalf of Thomasene Lot, DO,as directed by  Thomasene Lot, DO while in the presence of Thomasene Lot, DO.   I, Thomasene Lot, DO, have reviewed all documentation for this visit. The documentation on 08/07/22 for the exam, diagnosis, procedures, and orders are all accurate and complete.

## 2022-08-19 ENCOUNTER — Other Ambulatory Visit: Payer: Self-pay | Admitting: Oncology

## 2022-08-22 ENCOUNTER — Encounter: Payer: Self-pay | Admitting: Oncology

## 2022-08-22 MED ORDER — GABAPENTIN 300 MG PO CAPS: ORAL_CAPSULE | ORAL | 0 refills | Status: DC

## 2022-08-28 ENCOUNTER — Ambulatory Visit (INDEPENDENT_AMBULATORY_CARE_PROVIDER_SITE_OTHER): Payer: Medicare Other | Admitting: Family Medicine

## 2022-08-28 NOTE — Progress Notes (Incomplete)
Michelle Mcmillan, D.O.  ABFM, ABOM Specializing in Clinical Bariatric Medicine  Office located at: [address] [place], [place]  [address]     Assessment and Plan:   No orders of the defined types were placed in this encounter.   There are no discontinued medications.   No orders of the defined types were placed in this encounter.    *** There are no diagnoses linked to this encounter.     TREATMENT PLAN FOR OBESITY:   Assessment:  Michelle Mcmillan is here to discuss her progress with her obesity treatment plan along with follow-up of her obesity related diagnoses. See Medical Weight Management Flowsheet for complete bioelectrical impedance results.  Condition is {docourse:29403:::1}. {BiometricData (Optional):29179}  Since last office visit on *** patient's Fat mass has {DID:29233} by ***lb. Muscle mass has {DID:29233} by ***lb. Total body water has {DID:29233} by ***lb.  Counseling done on how various foods will affect these numbers and how to maximize success  Total lbs lost to date: *** Total weight loss percentage to date: *** ( use https://www.[employer].si )  Plan:  [name] is currently in the action stage of change. As such, her goal is to continue her weight management plan. [name] will work on healthier eating habits and try to follow the {mealplan:29239} best they can.   Behavioral Intervention Additional resources provided today: {weightresources:29185} Evidence-based interventions for health behavior change were utilized today including the discussion of self monitoring techniques, problem-solving barriers and SMART goal setting techniques.   Regarding patient's less desirable eating habits and patterns, we employed the technique of small changes.  Pt will specifically work on: *** for next visit.    Recommended Physical Activity Goals  Kimble has been advised to slowly work up to 150 minutes of moderate  intensity aerobic activity a week and strengthening exercises 2-3 times per week for cardiovascular health, weight loss maintenance and preservation of muscle mass.   She has agreed to {EMEXERCISE:28847::"Think about ways to increase daily physical activity and overcoming barriers to exercise"}  Pharmacotherapy Current Anti-obesity medications: ***. Reported side effects: ***. We discussed various medication options to help Perla with her weight loss efforts and we both agreed to ***.( or Patient prefers to not start any weight loss medications at this time.   FOLLOW UP: No follow-ups on file.*** She was informed of the importance of frequent follow up visits to maximize her success with intensive lifestyle modifications for her multiple health conditions.  Subjective:   Chief complaint: Obesity Michelle Mcmillan is here to discuss her progress with her obesity treatment plan. She is on the {MWMwtlossportion/plan2:23431} and states she is following her eating plan approximately ***% of the time. She states she is exercising *** minutes *** days per week.  Interval History:  Michelle Mcmillan is here for a follow up office visit.     Since last office visit:  ***  We reviewed her meal plan and all questions were answered. Patient's food recall appears to be accurate and consistent with what is on plan when she is following it. When eating on plan, her hunger and cravings are well controlled.      Pharmacotherapy for weight loss: She {srtis (Optional):29129} currently taking {srtpreviousweightlossmeds (Optional):29124} for medical weight loss.  Denies side effects.    Review of Systems:  Pertinent positives were addressed with patient today.  Weight Summary and Biometrics   No data recorded No data recorded ***  No data recorded No data recorded No data recorded No  data recorded   Objective:   PHYSICAL EXAM: There were no vitals taken for this visit. There is no height or weight on  file to calculate BMI.  General: Well Developed, well nourished, and in no acute distress.  HEENT: Normocephalic, atraumatic Skin: Warm and dry, cap RF less 2 sec, good turgor Chest:  Normal excursion, shape, no gross abn Respiratory: speaking in full sentences, no conversational dyspnea NeuroM-Sk: Ambulates w/o assistance, moves * 4 Psych: A and O *3, insight good, mood-full  DIAGNOSTIC DATA REVIEWED:  BMET    Component Value Date/Time   NA 144 06/24/2022 1047   K 3.5 06/24/2022 1047   CL 105 06/24/2022 1047   CO2 26 06/24/2022 1047   GLUCOSE 93 06/24/2022 1047   GLUCOSE 98 11/19/2021 1639   BUN 15 06/24/2022 1047   CREATININE 0.96 06/24/2022 1047   CALCIUM 9.1 06/24/2022 1047   GFRNONAA >60 11/19/2021 1639   GFRAA >60 01/25/2019 1100   Lab Results  Component Value Date   HGBA1C 6.3 (H) 06/24/2022   HGBA1C 6.0 (H) 06/20/2021   Lab Results  Component Value Date   INSULIN 12.1 06/24/2022   INSULIN 11.8 06/20/2021   Lab Results  Component Value Date   TSH 1.000 06/24/2022   CBC    Component Value Date/Time   WBC 5.5 06/24/2022 1047   WBC 6.3 11/19/2021 1639   RBC 4.54 06/24/2022 1047   RBC 4.48 11/19/2021 1639   HGB 11.7 06/24/2022 1047   HCT 37.3 06/24/2022 1047   PLT 220 06/24/2022 1047   MCV 82 06/24/2022 1047   MCH 25.8 (L) 06/24/2022 1047   MCH 25.9 (L) 11/19/2021 1639   MCHC 31.4 (L) 06/24/2022 1047   MCHC 30.9 11/19/2021 1639   RDW 16.4 (H) 06/24/2022 1047   Iron Studies    Component Value Date/Time   IRON 49 09/06/2021 1252   IRON 47 06/20/2021 0913   TIBC 273 09/06/2021 1252   TIBC 222 (L) 06/20/2021 0913   FERRITIN 224 09/06/2021 1252   FERRITIN 366 (H) 06/20/2021 0913   IRONPCTSAT 18 09/06/2021 1252   IRONPCTSAT 21 06/20/2021 0913   Lipid Panel     Component Value Date/Time   CHOL 180 06/24/2022 1047   TRIG 50 06/24/2022 1047   HDL 60 06/24/2022 1047   CHOLHDL 3.0 06/24/2022 1047   LDLCALC 110 (H) 06/24/2022 1047   Hepatic  Function Panel     Component Value Date/Time   PROT 6.1 06/24/2022 1047   ALBUMIN 4.0 06/24/2022 1047   AST 20 06/24/2022 1047   ALT 15 06/24/2022 1047   ALKPHOS 95 06/24/2022 1047   BILITOT 0.3 06/24/2022 1047      Component Value Date/Time   TSH 1.000 06/24/2022 1047   Nutritional Lab Results  Component Value Date   VD25OH 47.2 06/24/2022   VD25OH 48.2 12/31/2021   VD25OH 45.2 06/20/2021    Attestations:   Reviewed by clinician on day of visit: allergies, medications, problem list, medical history, surgical history, family history, social history, and previous encounter notes.   Patient was in the office today and time spent on visit including pre-visit chart review and post-visit care/coordination of care and electronic medical record documentation was *** minutes. 50% of the time was in face to face counseling of this patient's medical condition(s) and providing education on treatment options to include the first-line treatment of diet and lifestyle modification.   I,Special Puri,acting as a scribe for [employer], DO.,have documented all relevant documentation  on the behalf of Thomasene Lot, DO,as directed by  Thomasene Lot, DO while in the presence of Thomasene Lot, DO.   I, Thomasene Lot, DO, have reviewed all documentation for this visit. The documentation on 08/28/22 for the exam, diagnosis, procedures, and orders are all accurate and complete.

## 2022-08-30 ENCOUNTER — Other Ambulatory Visit: Payer: Self-pay | Admitting: Oncology

## 2022-09-02 ENCOUNTER — Encounter (INDEPENDENT_AMBULATORY_CARE_PROVIDER_SITE_OTHER): Payer: Self-pay | Admitting: Family Medicine

## 2022-09-02 ENCOUNTER — Ambulatory Visit (INDEPENDENT_AMBULATORY_CARE_PROVIDER_SITE_OTHER): Payer: Medicare Other | Admitting: Family Medicine

## 2022-09-02 VITALS — BP 121/82 | HR 73 | Temp 98.5°F | Ht 69.0 in | Wt 300.0 lb

## 2022-09-02 DIAGNOSIS — Z6841 Body Mass Index (BMI) 40.0 and over, adult: Secondary | ICD-10-CM | POA: Diagnosis not present

## 2022-09-02 DIAGNOSIS — E669 Obesity, unspecified: Secondary | ICD-10-CM | POA: Diagnosis not present

## 2022-09-02 DIAGNOSIS — G43909 Migraine, unspecified, not intractable, without status migrainosus: Secondary | ICD-10-CM | POA: Diagnosis not present

## 2022-09-02 DIAGNOSIS — I10 Essential (primary) hypertension: Secondary | ICD-10-CM | POA: Diagnosis not present

## 2022-09-02 MED ORDER — HYDROCHLOROTHIAZIDE 12.5 MG PO CAPS: 12.5000 mg | ORAL_CAPSULE | Freq: Every day | ORAL | 0 refills | Status: DC

## 2022-09-02 MED ORDER — TOPIRAMATE 50 MG PO TABS: ORAL_TABLET | ORAL | 0 refills | Status: DC

## 2022-09-02 NOTE — Progress Notes (Signed)
Michelle Mcmillan, D.O.  ABFM, ABOM Specializing in Clinical Bariatric Medicine  Office located at: [address] [place], [place]  [address]     Assessment and Plan:   Medications Discontinued During This Encounter  Medication Reason   hydrochlorothiazide (MICROZIDE) 12.5 MG capsule Reorder   topiramate (TOPAMAX) 50 MG tablet Reorder     Meds ordered this encounter  Medications   topiramate (TOPAMAX) 50 MG tablet    Sig: one tab by mouth twice daily.    Dispense:  60 tablet    Refill:  0   hydrochlorothiazide (MICROZIDE) 12.5 MG capsule    Sig: Take 1 capsule (12.5 mg total) by mouth daily.    Dispense:  90 capsule    Refill:  0     Migraine without status migrainosus, not intractable, unspecified migraine type Assessment: Condition is improving, but not optimized. - Pt reports having some hunger and cravings at night and wonders if she can increase dose - Her migraines have been well controlled.  - She has been compliant with Topamax 50 mg daily. Denies any adverse effects.  Plan: - Increase Topamax to 50 mg BID.  Risks/benefits of increased medication dose were reviewed with pt. All questions were answered appropriately - Will continue to monitor as deemed clinically necessary.   Essential hypertension Assessment: Condition is stable.  Last 3 blood pressure readings in our office are as follows: BP Readings from Last 3 Encounters:  09/02/22 121/82  08/07/22 132/85  07/18/22 117/78  - Her blood pressure is stable today. No concerns.  - No issues with Microzide 12.5 mg daily. Denies any adverse effects.  Plan: - Continue with med. Will refill this today.   - Continue with Prudent nutritional plan and low sodium diet, advance exercise as tolerated.   - Will continue to monitor alongside PCP.    TREATMENT PLAN FOR OBESITY: BMI 45.0-49.9, adult (HCC)-44.28 Morbid obesity (HCC)-start bmi 49.18/date 06/20/21 Assessment:  Michelle Mcmillan is here to discuss  her progress with her obesity treatment plan along with follow-up of her obesity related diagnoses. See Medical Weight Management Flowsheet for complete bioelectrical impedance results.  Condition is improving. Biometric data collected today, was reviewed with patient.   Since last office visit on 08/07/22  patient's  Muscle mass has increased by 3 lb. Fat mass has decreased by 6.8 lb. Counseling done on how various foods will affect these numbers and how to maximize success  Total lbs lost to date: 33 Total weight loss percentage to date: 9.91   Plan:  - Continue the Category 3 meal plan with breakfast and lunch options.   - Showed the patient the [employer] packets and recommended pt to pack her lunch qd.   Behavioral Intervention Additional resources provided today: patient declined Evidence-based interventions for health behavior change were utilized today including the discussion of self monitoring techniques, problem-solving barriers and SMART goal setting techniques.   Regarding patient's less desirable eating habits and patterns, we employed the technique of small changes.  Pt will specifically work on: increasing walking to 8,000 steps daily for stress management for next visit.    Recommended Physical Activity Goals  Hally has been advised to slowly work up to 150 minutes of moderate intensity aerobic activity a week and strengthening exercises 2-3 times per week for cardiovascular health, weight loss maintenance and preservation of muscle mass.   She has agreed to Continue current level of physical activity   FOLLOW UP: Return 09/25/22. She was  informed of the importance of frequent follow up visits to maximize her success with intensive lifestyle modifications for her multiple health conditions.   Subjective:   Chief complaint: Obesity Michelle Mcmillan is here to discuss her progress with her obesity treatment plan. She is on the the Category 3 Plan with breakfast and lunch  options and states she is following her eating plan approximately 40% of the time. She states she is walking 5,000 steps 7 days a week.   Interval History:  Michelle Mcmillan is here for a follow up office visit.     Since last office visit:   - She endorses that she has been under stress lately (her husband had a mild heart attack) and has been in and out of the hospital. - Despite the stress, she has been working on eating healthier and trying to make smart choices.  - When eating on plan, her hunger and cravings are mostly controlled.     Pharmacotherapy for weight loss: She is currently taking Topamax for medical weight loss.  Denies side effects.    Review of Systems:  Pertinent positives were addressed with patient today.  Weight Summary and Biometrics   Weight Lost Since Last Visit: 4lb  Weight Gained Since Last Visit: 0    Vitals Temp: 98.5 F (36.9 C) BP: 121/82 Pulse Rate: 73 SpO2: 100 %   Anthropometric Measurements Height: 5\' 9"  (1.753 m) Weight: 300 lb (136.1 kg) BMI (Calculated): 44.28 Weight at Last Visit: 304lb Weight Lost Since Last Visit: 4lb Weight Gained Since Last Visit: 0 Starting Weight: 333lb Total Weight Loss (lbs): 31 lb (14.1 kg) Peak Weight: 358lb   Body Composition  Body Fat %: 52.7 % Fat Mass (lbs): 158.4 lbs Muscle Mass (lbs): 135 lbs Total Body Water (lbs): 107 lbs Visceral Fat Rating : 17   Other Clinical Data Fasting: no Labs: no Today's Visit #: 20 Starting Date: 06/20/21     Objective:   PHYSICAL EXAM: Blood pressure 121/82, pulse 73, temperature 98.5 F (36.9 C), height 5\' 9"  (1.753 m), weight 300 lb (136.1 kg), SpO2 100 %. Body mass index is 44.3 kg/m.  General: Well Developed, well nourished, and in no acute distress.  HEENT: Normocephalic, atraumatic Skin: Warm and dry, cap RF less 2 sec, good turgor Chest:  Normal excursion, shape, no gross abn Respiratory: speaking in full sentences, no conversational  dyspnea NeuroM-Sk: Ambulates w/o assistance, moves * 4 Psych: A and O *3, insight good, mood-full  DIAGNOSTIC DATA REVIEWED:  BMET    Component Value Date/Time   NA 144 06/24/2022 1047   K 3.5 06/24/2022 1047   CL 105 06/24/2022 1047   CO2 26 06/24/2022 1047   GLUCOSE 93 06/24/2022 1047   GLUCOSE 98 11/19/2021 1639   BUN 15 06/24/2022 1047   CREATININE 0.96 06/24/2022 1047   CALCIUM 9.1 06/24/2022 1047   GFRNONAA >60 11/19/2021 1639   GFRAA >60 01/25/2019 1100   Lab Results  Component Value Date   HGBA1C 6.3 (H) 06/24/2022   HGBA1C 6.0 (H) 06/20/2021   Lab Results  Component Value Date   INSULIN 12.1 06/24/2022   INSULIN 11.8 06/20/2021   Lab Results  Component Value Date   TSH 1.000 06/24/2022   CBC    Component Value Date/Time   WBC 5.5 06/24/2022 1047   WBC 6.3 11/19/2021 1639   RBC 4.54 06/24/2022 1047   RBC 4.48 11/19/2021 1639   HGB 11.7 06/24/2022 1047   HCT 37.3 06/24/2022 1047  PLT 220 06/24/2022 1047   MCV 82 06/24/2022 1047   MCH 25.8 (L) 06/24/2022 1047   MCH 25.9 (L) 11/19/2021 1639   MCHC 31.4 (L) 06/24/2022 1047   MCHC 30.9 11/19/2021 1639   RDW 16.4 (H) 06/24/2022 1047   Iron Studies    Component Value Date/Time   IRON 49 09/06/2021 1252   IRON 47 06/20/2021 0913   TIBC 273 09/06/2021 1252   TIBC 222 (L) 06/20/2021 0913   FERRITIN 224 09/06/2021 1252   FERRITIN 366 (H) 06/20/2021 0913   IRONPCTSAT 18 09/06/2021 1252   IRONPCTSAT 21 06/20/2021 0913   Lipid Panel     Component Value Date/Time   CHOL 180 06/24/2022 1047   TRIG 50 06/24/2022 1047   HDL 60 06/24/2022 1047   CHOLHDL 3.0 06/24/2022 1047   LDLCALC 110 (H) 06/24/2022 1047   Hepatic Function Panel     Component Value Date/Time   PROT 6.1 06/24/2022 1047   ALBUMIN 4.0 06/24/2022 1047   AST 20 06/24/2022 1047   ALT 15 06/24/2022 1047   ALKPHOS 95 06/24/2022 1047   BILITOT 0.3 06/24/2022 1047      Component Value Date/Time   TSH 1.000 06/24/2022 1047    Nutritional Lab Results  Component Value Date   VD25OH 47.2 06/24/2022   VD25OH 48.2 12/31/2021   VD25OH 45.2 06/20/2021    Attestations:   Reviewed by clinician on day of visit: allergies, medications, problem list, medical history, surgical history, family history, social history, and previous encounter notes.    I,Special Puri,acting as a Neurosurgeon for [employer], DO.,have documented all relevant documentation on the behalf of Thomasene Lot, DO,as directed by  Thomasene Lot, DO while in the presence of Thomasene Lot, DO.   I, Thomasene Lot, DO, have reviewed all documentation for this visit. The documentation on 09/02/22 for the exam, diagnosis, procedures, and orders are all accurate and complete.

## 2022-09-05 ENCOUNTER — Encounter: Payer: Self-pay | Admitting: Radiology

## 2022-09-05 ENCOUNTER — Ambulatory Visit
Admission: RE | Admit: 2022-09-05 | Discharge: 2022-09-05 | Disposition: A | Discharge status: Home / Self Care | Payer: Medicare Other | Source: Ambulatory Visit | Attending: Oncology | Admitting: Oncology

## 2022-09-05 DIAGNOSIS — Z9221 Personal history of antineoplastic chemotherapy: Secondary | ICD-10-CM | POA: Diagnosis not present

## 2022-09-05 DIAGNOSIS — C50411 Malignant neoplasm of upper-outer quadrant of right female breast: Secondary | ICD-10-CM | POA: Diagnosis present

## 2022-09-05 DIAGNOSIS — Z780 Asymptomatic menopausal state: Secondary | ICD-10-CM | POA: Diagnosis not present

## 2022-09-05 DIAGNOSIS — Z13820 Encounter for screening for osteoporosis: Secondary | ICD-10-CM | POA: Insufficient documentation

## 2022-09-05 DIAGNOSIS — Z170 Estrogen receptor positive status [ER+]: Secondary | ICD-10-CM | POA: Diagnosis present

## 2022-09-10 ENCOUNTER — Inpatient Hospital Stay: Payer: Medicare Other | Attending: Oncology | Admitting: Oncology

## 2022-09-10 ENCOUNTER — Encounter: Payer: Self-pay | Admitting: Oncology

## 2022-09-10 ENCOUNTER — Other Ambulatory Visit: Payer: Self-pay

## 2022-09-10 VITALS — BP 111/74 | HR 76 | Temp 98.7°F | Wt 307.8 lb

## 2022-09-10 DIAGNOSIS — Z9221 Personal history of antineoplastic chemotherapy: Secondary | ICD-10-CM | POA: Insufficient documentation

## 2022-09-10 DIAGNOSIS — C50411 Malignant neoplasm of upper-outer quadrant of right female breast: Secondary | ICD-10-CM | POA: Insufficient documentation

## 2022-09-10 DIAGNOSIS — Z79811 Long term (current) use of aromatase inhibitors: Secondary | ICD-10-CM | POA: Diagnosis not present

## 2022-09-10 DIAGNOSIS — Z170 Estrogen receptor positive status [ER+]: Secondary | ICD-10-CM | POA: Insufficient documentation

## 2022-09-10 NOTE — Progress Notes (Signed)
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   INTERVAL HISTORY: Patient returns to clinic today for routine 37-month evaluation.  She continues to feel well and remains asymptomatic.  She is tolerating letrozole without significant side effects.  She has no neurologic complaints.  She has chronic joint pain.  She has a good appetite and denies weight loss.  She has no chest pain, shortness of breath, cough, or hemoptysis.  She denies any nausea, vomiting, constipation, or diarrhea.  She has no urinary complaints.  Patient offers no further specific complaints today.  REVIEW OF SYSTEMS:   Review of Systems  Constitutional: Negative.  Negative for diaphoresis, fever, malaise/fatigue and weight loss.  Respiratory: Negative.  Negative for cough and shortness of breath.   Cardiovascular: Negative.  Negative for chest pain and leg swelling.  Gastrointestinal: Negative.  Negative for abdominal pain, constipation, diarrhea, nausea and vomiting.  Genitourinary: Negative.  Negative for dysuria.  Musculoskeletal:  Positive for joint pain. Negative for myalgias.  Skin: Negative.  Negative for rash.  Neurological: Negative.  Negative for tingling, sensory change, focal weakness and weakness.  Psychiatric/Behavioral: Negative.  The patient is not nervous/anxious and does not have insomnia.     As per HPI. Otherwise, a complete review of systems is negative.  PAST MEDICAL  HISTORY: Past Medical History:  Diagnosis Date   Anemia    H/O   Anemia    Arthritis    RIGHT KNEE   Back pain    Breast cancer (HCC) 2017   right breast   Cancer (HCC)    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)   Carpal tunnel syndrome    Constipation    Depression    Dyspnea    GERD (gastroesophageal reflux disease)    NO MEDS   Headache    migraines   Hypertension    Intractable nausea and vomiting 08/11/2017   Joint pain    Last menstrual period (LMP) > 10 days ago 08/2015   Lower extremity edema    Migraines    Neuropathy    OSA (obstructive sleep apnea)    Personal history of radiation therapy    Prediabetes    SOB (shortness of breath)     PAST SURGICAL HISTORY: Past Surgical History:  Procedure Laterality Date   BREAST BIOPSY Right 05/08/2017   invasive mamm. ca   BREAST EXCISIONAL BIOPSY Right 06/15/15   breast cancer   BREAST LUMPECTOMY WITH NEEDLE LOCALIZATION Right 06/15/2015   Procedure: BREAST LUMPECTOMY WITH NEEDLE LOCALIZATION;  Surgeon: Gladis Riffle, MD;  Location: [facility];  Service: General;  Laterality: Right;   BREAST RECONSTRUCTION WITH PLACEMENT OF TISSUE EXPANDER AND FLEX HD (ACELLULAR HYDRATED DERMIS) Left 02/09/2018   Procedure: BREAST RECONSTRUCTION WITH PLACEMENT OF TISSUE EXPANDER AND FLEX HD (ACELLULAR HYDRATED DERMIS);  Surgeon: Peggye Form, DO;  Location: [facility];  Service: Plastics;  Laterality: Left;   BREAST SURGERY     CESAREAN SECTION  1988   x1   COLONOSCOPY WITH PROPOFOL N/A 01/23/2017   Procedure: COLONOSCOPY WITH  PROPOFOL;  Surgeon: Toney Reil, MD;  Location: [facility];  Service: Gastroenterology;  Laterality: N/A;   ESOPHAGOGASTRODUODENOSCOPY (EGD) WITH PROPOFOL N/A 01/23/2017   Procedure: ESOPHAGOGASTRODUODENOSCOPY (EGD) WITH PROPOFOL;  Surgeon: Toney Reil, MD;  Location: [facility] END[facility]COPY;  Service: Gastroenterology;  Laterality: N/A;   LATISSIMUS FLAP TO BREAST Right 06/01/2018   Procedure: RIGHT BREAST  RECONSTRUCTION WITH LATISSIMUS MYOCUTANEOUS FLAP;  Surgeon: Peggye Form, DO;  Location: [facility];  Service: Plastics;  Laterality: Right;   LIPOSUCTION WITH LIPOFILLING Bilateral 10/15/2018   Procedure: LIPOSUCTION TO BILATERAL BREASTS;  Surgeon: Peggye Form, DO;  Location: [facility];  Service: Plastics;  Laterality: Bilateral;   LIPOSUCTION WITH LIPOFILLING Bilateral 04/22/2019   Procedure: fat grafting/liposuction and lipofilling bilateral breasts;  Surgeon: Peggye Form, DO;  Location: [facility];  Service: Plastics;  Laterality: Bilateral;   MASS EXCISION N/A 05/20/2016   Procedure: EXCISION CHEST WALL MASS;  Surgeon: Lattie Haw, MD;  Location: [facility];  Service: General;  Laterality: N/A;   RECONSTRUCTION BREAST W/ LATISSIMUS DORSI FLAP Right 06/01/2018   REMOVAL OF BILATERAL TISSUE EXPANDERS WITH PLACEMENT OF BILATERAL BREAST IMPLANTS Bilateral 10/15/2018   Procedure: REMOVAL OF BILATERAL TISSUE EXPANDERS WITH PLACEMENT OF BILATERAL BREAST IMPLANTS;  Surgeon: Peggye Form, DO;  Location: [facility];  Service: Plastics;  Laterality: Bilateral;   SCAR REVISION Bilateral 04/22/2019   Procedure: Excision of bilateral scar contracture;  Surgeon: Peggye Form, DO;  Location: [facility];  Service: Plastics;  Laterality: Bilateral;  total case 90 min   SENTINEL NODE BIOPSY Right 06/15/2015   Procedure: SENTINEL NODE BIOPSY;  Surgeon: Gladis Riffle, MD;  Location: [facility];  Service: General;  Laterality: Right;   TOTAL MASTECTOMY Right 05/22/2017   Procedure: TOTAL MASTECTOMY;  Surgeon: Lattie Haw, MD;  Location: [facility];  Service: General;  Laterality: Right;   TOTAL MASTECTOMY Left 02/09/2018   Procedure: LEFT PROPHYLACTIC  MASTECTOMY;  Surgeon: Griselda Miner, MD;  Location: [facility];  Service: General;  Laterality: Left;   TUBAL LIGATION  2004   ULNAR NERVE TRANSPOSITION Left  01/29/2019   Procedure: LEFT ULNAR NERVE DECOMPRESSION/TRANSPOSITION;  Surgeon: Betha Loa, MD;  Location: [facility];  Service: Orthopedics;  Laterality: Left;    FAMILY HISTORY Family History  Problem Relation Age of Onset   Hypertension Mother    Obesity Mother    Hypertension Father    Diabetes Father    Hypertension Sister    Breast cancer Paternal Aunt        dx 44s   Breast cancer Paternal Aunt        dx 55s   Breast cancer Cousin 62       ADVANCED DIRECTIVES:    HEALTH MAINTENANCE: Social History   Tobacco Use   Smoking status: Never   Smokeless tobacco: Never  Vaping Use   Vaping Use: Never used  Substance Use Topics   Alcohol use: Not Currently   Drug use: No    Allergies  Allergen Reactions   Nitroglycerin Other (See Comments)    Headache, n/v.    Current Outpatient Medications  Medication Sig Dispense Refill   APPLE CIDER VINEGAR PO Take by mouth. Two tabs daily     buPROPion (WELLBUTRIN XL) 150 MG 24 hr tablet Take 150 mg by mouth daily.     butalbital-aspirin-caffeine (FIORINAL) 50-325-40 MG capsule Take 1 capsule by mouth as needed for headache.  calcium-vitamin D (OSCAL WITH D) 500-200 MG-UNIT TABS tablet Take 1 tablet by mouth 2 (two) times daily. With meals     diclofenac sodium (VOLTAREN) 1 % GEL Apply 4 g topically 4 (four) times daily.     dorzolamide-timolol (COSOPT) 22.3-6.8 MG/ML ophthalmic solution Place 1 drop into both eyes every morning.     ELDERBERRY PO Take 50 mg by mouth daily.     Ferrous Sulfate (IRON) 325 (65 Fe) MG TABS Take 1 tablet (325 mg total) by mouth 2 (two) times daily. 60 each 2   gabapentin (NEURONTIN) 300 MG capsule TAKE 1 CAPSULE BY MOUTH 3  TIMES DAILY AND MAY TAKE 2  CAPSULES BY MOUTH AT  BEDTIME IF MORE BOTHERSOME  AT NIGHT 450 capsule 0   hydrochlorothiazide (MICROZIDE) 12.5 MG capsule Take 1 capsule (12.5 mg total) by mouth daily. 90 capsule 0   HYDROcodone-acetaminophen (NORCO/VICODIN)  5-325 MG tablet Take 1 tablet by mouth every 6 (six) hours as needed for severe pain. 5 tablet 0   ibuprofen (ADVIL) 800 MG tablet TAKE 1 TABLET BY MOUTH EVERY 8 HOURS WITH FOOD AS NEEDED FOR PAIN     latanoprost (XALATAN) 0.005 % ophthalmic solution Place 1 drop into both eyes at bedtime.     letrozole (FEMARA) 2.5 MG tablet TAKE 1 TABLET BY MOUTH DAILY 90 tablet 0   Melatonin 5 MG CAPS Take 5 mg by mouth at bedtime.     omeprazole (PRILOSEC) 20 MG capsule Take 1 capsule (20 mg total) by mouth daily. 30 capsule 3   rizatriptan (MAXALT) 10 MG tablet SMARTSIG:1 Tablet(s) By Mouth 1 to 2 Times Daily     topiramate (TOPAMAX) 50 MG tablet one tab by mouth twice daily. 60 tablet 0   No current facility-administered medications for this visit.    OBJECTIVE: Vitals:   09/10/22 1342  BP: 111/74  Pulse: 76  Temp: 98.7 F (37.1 C)  SpO2: 98%     Body mass index is 45.45 kg/m.    ECOG FS:0 - Asymptomatic  General: Well-developed, well-nourished, no acute distress. Eyes: Pink conjunctiva, anicteric sclera. HEENT: Normocephalic, moist mucous membranes. Lungs: No audible wheezing or coughing. Heart: Regular rate and rhythm. Abdomen: Soft, nontender, no obvious distention. Musculoskeletal: No edema, cyanosis, or clubbing. Neuro: Alert, answering all questions appropriately. Cranial nerves grossly intact. Skin: No rashes or petechiae noted. Psych: Normal affect.  LAB RESULTS:  Lab Results  Component Value Date   NA 144 06/24/2022   K 3.5 06/24/2022   CL 105 06/24/2022   CO2 26 06/24/2022   GLUCOSE 93 06/24/2022   BUN 15 06/24/2022   CREATININE 0.96 06/24/2022   CALCIUM 9.1 06/24/2022   PROT 6.1 06/24/2022   ALBUMIN 4.0 06/24/2022   AST 20 06/24/2022   ALT 15 06/24/2022   ALKPHOS 95 06/24/2022   BILITOT 0.3 06/24/2022   GFRNONAA >60 11/19/2021   GFRAA >60 01/25/2019    Lab Results  Component Value Date   WBC 5.5 06/24/2022   NEUTROABS 3.4 06/24/2022   HGB 11.7 06/24/2022    HCT 37.3 06/24/2022   MCV 82 06/24/2022   PLT 220 06/24/2022     STUDIES: DG Bone Density  Result Date: 09/05/2022 EXAM: DUAL X-RAY ABSORPTIOMETRY (DXA) FOR BONE MINERAL DENSITY IMPRESSION: Your patient Teashia Trzaska completed a BMD test on 09/05/2022 using the [employer] iDXA DXA System (software version: 14.10) manufactured by [employer]. The following summarizes the results of our evaluation. Technologist: [facility] PATIENT BIOGRAPHICAL: Name: Mcmillan,  Michelle Patient ID: [id number] Birth Date: 02-03-1971 Height: 69.0 in. Gender: Female Exam Date: 09/05/2022 Weight: 305.2 lbs. Indications: History of Breast Cancer, History of Chemo, High Risk Meds, History of Radiation, Postmenopausal Fractures: Toes Treatments: Calcium, Gabapentin, Letrozole, Vitamin D DENSITOMETRY RESULTS: Site         Region     Measured Date Measured Age WHO Classification Young Adult T-score BMD         %Change vs. Previous Significant Change (*) AP Spine L1-L4 09/05/2022 52.8 Normal 1.0 1.316 g/cm2 2.7% Yes AP Spine L1-L4 09/03/2021 51.8 Normal 0.7 1.282 g/cm2 0.0% - AP Spine L1-L4 12/01/2019 50.0 Normal 0.7 1.282 g/cm2 -14.5% Yes AP Spine L1-L4 09/04/2016 46.8 Normal 2.5 1.499 g/cm2 - - DualFemur Neck Left 09/05/2022 52.8 Normal 0.4 1.099 g/cm2 -5.1% Yes DualFemur Neck Left 09/03/2021 51.8 Normal 0.9 1.158 g/cm2 0.0% - DualFemur Neck Left 12/01/2019 50.0 Normal 0.9 1.158 g/cm2 -6.3% Yes DualFemur Neck Left 11/16/2018 49.0 Normal 1.4 1.236 g/cm2 -0.3% - DualFemur Neck Left 11/12/2017 48.0 Normal 1.5 1.240 g/cm2 0.3% - DualFemur Neck Left 09/04/2016 46.8 Normal 1.4 1.236 g/cm2 - - DualFemur Total Mean 09/05/2022 52.8 Normal 1.3 1.176 g/cm2 -1.3% - DualFemur Total Mean 09/03/2021 51.8 Normal 1.5 1.192 g/cm2 -3.2% Yes DualFemur Total Mean 12/01/2019 50.0 Normal 1.8 1.231 g/cm2 -1.9% Yes DualFemur Total Mean 11/16/2018 49.0 Normal 2.0 1.255 g/cm2 -2.4% Yes DualFemur Total Mean 11/12/2017 48.0 Normal 2.2 1.286 g/cm2 1.5% Yes DualFemur  Total Mean 09/04/2016 46.8 Normal 2.1 1.267 g/cm2 - - Left Forearm Radius 33% 09/05/2022 52.8 Normal 0.1 0.883 g/cm2 0.9% - Left Forearm Radius 33% 11/16/2018 49.0 Normal 0.0 0.875 g/cm2 1.5% - Left Forearm Radius 33% 11/12/2017 48.0 Normal -0.2 0.862 g/cm2 -3.4% - Left Forearm Radius 33% 09/04/2016 46.8 Normal 0.2 0.892 g/cm2 - - ASSESSMENT: The BMD measured at Forearm Radius 33% is 0.883 g/cm2 with a T-score of 0.1. This patient is considered normal according to World Health Organization [facility]) criteria. The scan quality is good. Compared with prior study, there has been significant increase in the spine. Compared with prior study, there has been no significant change in the total hip. World Science writer [facility]) criteria for post-menopausal, Caucasian Women: Normal:                   T-score at or above -1 SD Osteopenia/low bone mass: T-score between -1 and -2.5 SD Osteoporosis:             T-score at or below -2.5 SD RECOMMENDATIONS: 1. All patients should optimize calcium and vitamin D intake. 2. Consider FDA-approved medical therapies in postmenopausal women and men aged 56 years and older, based on the following: a. A hip or vertebral(clinical or morphometric) fracture b. T-score < -2.5 at the femoral neck or spine after appropriate evaluation to exclude secondary causes c. Low bone mass (T-score between -1.0 and -2.5 at the femoral neck or spine) and a 10-year probability of a hip fracture > 3% or a 10-year probability of a major osteoporosis-related fracture > 20% based on the [place]-adapted WHO algorithm 3. Clinician judgment and/or patient preferences may indicate treatment for people with 10-year fracture probabilities above or below these levels FOLLOW-UP: People with diagnosed cases of osteoporosis or at high risk for fracture should have regular bone mineral density tests. For patients eligible for Medicare, routine testing is allowed once every 2 years. The testing frequency can be increased to one  year for patients who have rapidly progressing disease, those who are receiving or discontinuing  medical therapy to restore bone mass, or have additional risk factors. I have reviewed this report, and agree with the above findings. [facility] Electronically Signed   By: Bary Richard M.D.   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 BRCA 1 and 2 negative, Oncotype DX score 17 which is considered low risk.  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  This occurred while patient was taking Aromasin. Oncotype was not sent on her second breast cancer.  Previously, CT scans and bone scans reviewed independently with no evidence of metastatic disease confirming stage I malignancy.  Patient completed cycle 5 of Taxotere and Cytoxan on September 16, 2017.  She received 1 additional cycle secondary to a reaction she had during cycle 1 and did not complete treatment at that time.  She has now completed reconstruction.  Patient does not require additional mammograms given her bilateral mastectomy.  She has now completed 5 years of letrozole and has been instructed to discontinue when she completes her current prescription.  No further intervention is needed.  Return to clinic in 1 year for routine evaluation. Genetic testing: Patient was found to be BRCA 1 and 2 negative. She expressed understanding that although her genetic testing was negative, her children are at higher risk for breast cancer than the general population.  Bone health: Patient's most recent bone mineral density in September 05, 2022 reported T-score of 0.1 which continues to be normal.  No intervention needed.  Bone  mineral densities can now be monitored by patient's primary care physician.   Joint pain: Chronic and unchanged.   Patient expressed understanding and was in agreement with this plan. She also understands that She can call clinic at any time with any questions, concerns, or complaints.    Jeralyn Ruths, MD 09/10/22 2:11 PM

## 2022-09-10 NOTE — Progress Notes (Signed)
Patient says she feels much better all together whenever she sticks to just eating fruits, vegetables and drinking water. But as soon as she starts to go back to eating just anything she starts to feel horrible again.

## 2022-09-22 ENCOUNTER — Other Ambulatory Visit (INDEPENDENT_AMBULATORY_CARE_PROVIDER_SITE_OTHER): Payer: Self-pay | Admitting: Family Medicine

## 2022-09-22 DIAGNOSIS — G43909 Migraine, unspecified, not intractable, without status migrainosus: Secondary | ICD-10-CM

## 2022-09-25 ENCOUNTER — Ambulatory Visit (INDEPENDENT_AMBULATORY_CARE_PROVIDER_SITE_OTHER): Payer: Medicare Other | Admitting: Family Medicine

## 2022-09-25 ENCOUNTER — Encounter (INDEPENDENT_AMBULATORY_CARE_PROVIDER_SITE_OTHER): Payer: Self-pay | Admitting: Family Medicine

## 2022-09-25 VITALS — BP 128/82 | HR 60 | Temp 98.2°F | Ht 69.0 in | Wt 303.0 lb

## 2022-09-25 DIAGNOSIS — I10 Essential (primary) hypertension: Secondary | ICD-10-CM

## 2022-09-25 DIAGNOSIS — E559 Vitamin D deficiency, unspecified: Secondary | ICD-10-CM

## 2022-09-25 DIAGNOSIS — Z6841 Body Mass Index (BMI) 40.0 and over, adult: Secondary | ICD-10-CM

## 2022-09-25 DIAGNOSIS — G43909 Migraine, unspecified, not intractable, without status migrainosus: Secondary | ICD-10-CM

## 2022-09-25 DIAGNOSIS — R7303 Prediabetes: Secondary | ICD-10-CM | POA: Diagnosis not present

## 2022-09-25 MED ORDER — TOPIRAMATE 50 MG PO TABS
ORAL_TABLET | ORAL | 0 refills | Status: DC
Start: 2022-09-25 — End: 2022-11-12

## 2022-09-25 NOTE — Progress Notes (Signed)
Carlye Grippe, D.O.  ABFM, ABOM Specializing in Clinical Bariatric Medicine  Office located at: [address] [place], [place]  [address]     Assessment and Plan:   Orders Placed This Encounter  Procedures   Hemoglobin A1c   Insulin, random   VITAMIN D 25 Hydroxy (Vit-D Deficiency, Fractures)    Medications Discontinued During This Encounter  Medication Reason   topiramate (TOPAMAX) 50 MG tablet Reorder     Meds ordered this encounter  Medications   topiramate (TOPAMAX) 50 MG tablet    Sig: one tab by mouth twice daily.    Dispense:  60 tablet    Refill:  0    Essential hypertension Assessment: Her blood pressure is stable. No concerns in this regard today. No issues with Microzide 12.5 mg daily, compliance good. She endorses that her blood pressure has also been stable at home and < 130/80.   Last 3 blood pressure readings in our office are as follows: BP Readings from Last 3 Encounters:  09/25/22 128/82  09/10/22 111/74  09/02/22 121/82   Plan: Continue with antihypertensive medication at current dose. Continue with Prudent nutritional plan and low sodium diet, advance exercise as tolerated.    Ambulatory blood pressure monitoring encouraged.  Reminded patient that if they ever feel poorly in any way, to check their blood pressure and pulse as well.We will continue to monitor closely alongside PCP/ specialists. Pt reminded to also f/up with those individuals as instructed by them. We will continue to monitor symptoms as they relate to the her weight loss journey.    Migraine without status migrainosus, not intractable, unspecified migraine type Assessment: Her headaches have been stable. Pt is tolerating Topamax 50 mg twice daily well. She has noticed a decrease in her cravings and tendency to do late night snacking since starting the medication.   Plan: Continue with Topamax at current dose. Will refill today.    Continue her prudent nutritional plan  that is low in simple carbohydrates, saturated fats and trans fats to goal of 5-10% weight loss to achieve significant health benefits.  Pt encouraged to continually advance exercise and cardiovascular fitness as tolerated throughout weight loss journey. Will continue to monitor condition closely.    Vitamin D deficiency Assessment: Pt endorses that she is currently taking an unknown dose of OTC calcium- Vitamin D.   Lab Results  Component Value Date   VD25OH 47.2 06/24/2022   VD25OH 48.2 12/31/2021   VD25OH 45.2 06/20/2021   Plan: Recheck labs.   Continue with OTC supplement for now. Pt agrees to inform us of the exact dosage of calcium-vitamin D she is taking next OV.   Weight loss will likely improve availability of vitamin D, thus encouraged Viann to continue with meal plan and their weight loss efforts to further improve this condition.  Thus, we will need to monitor levels regularly (every 3-4 mo on average) to keep levels within normal limits and prevent over supplementation.   Prediabetes Assessment: Condition is not optimized. Pt is not on any prediabetic medications currently. This condition is being treated with diet and exercise.   Lab Results  Component Value Date   HGBA1C 6.3 (H) 06/24/2022   HGBA1C 6.0 (H) 12/31/2021   HGBA1C 6.0 (H) 06/20/2021   INSULIN 12.1 06/24/2022   INSULIN 11.0 12/31/2021   INSULIN 11.8 06/20/2021    Plan: Recheck labs.   Verdie will continue to work on weight loss, exercise, via their meal plan we  devised to help decrease the risk of progressing to diabetes. We will recheck A1c and fasting insulin level in approximately 3 months from last check, or as deemed appropriate.    TREATMENT PLAN FOR OBESITY: BMI 45.0-49.9, adult (HCC)- current BMI 44.72 Morbid obesity (HCC)-start bmi 49.18/date 06/20/21 Assessment: Karra Pink is here to discuss her progress with her obesity treatment plan along with follow-up of her obesity related  diagnoses. See Medical Weight Management Flowsheet for complete bioelectrical impedance results.  Condition is not optimized.  Biometric data collected today, was reviewed with patient.   Since last office visit on 09/02/22 patient's  Muscle mass has decreased by 0.6 lb. Fat mass has increased by 3 lb. Total body water has increased by 1.4 lb.  Counseling done on how various foods will affect these numbers and how to maximize success  Total lbs lost to date: 30 lbs  Total weight loss percentage to date: 9.01   Plan: Continue the Category 3 meal plan with breakfast and lunch options.   - I recommended pt to explore the Food Finder App, which can be used to locate food pantries nearby her home.   Behavioral Intervention Additional resources provided today: patient declined Evidence-based interventions for health behavior change were utilized today including the discussion of self monitoring techniques, problem-solving barriers and SMART goal setting techniques.   Regarding patient's less desirable eating habits and patterns, we employed the technique of small changes.  Pt will specifically work on: continue with prescribed meal plan/ current exercise regiment  for next visit.    Recommended Physical Activity Goals  Mareesa has been advised to slowly work up to 150 minutes of moderate intensity aerobic activity a week and strengthening exercises 2-3 times per week for cardiovascular health, weight loss maintenance and preservation of muscle mass.   She has agreed to Continue current level of physical activity   FOLLOW UP: Return in about 3 weeks (around 10/16/2022). She was informed of the importance of frequent follow up visits to maximize her success with intensive lifestyle modifications for her multiple health conditions.  Lisbet Busker is aware that we will review all of her lab results at our next visit.  She is aware that if anything is critical/ life threatening with the results, we  will be contacting her via MyChart prior to the office visit to discuss management.    Subjective:   Chief complaint: Obesity Laticia is here to discuss her progress with her obesity treatment plan. She is on the Category 3 Plan with breakfast and lunch options and states she is following her eating plan approximately 50% of the time. She states she is doing spinning exercises 30 minutes 3-4 days per week.  Interval History:  Adonai Helzer is here for a follow up office visit. Since last OV, Zenobia has been doing okay. She endorses that food insecurity has been a challenge lately and therefore sometimes been skipping meals and not eating the optimal amounts of protein. She is tolerating all her medications well.   Pharmacotherapy for weight loss: She is currently taking  Topamax  for medical weight loss.  Denies side effects.    Review of Systems:  Pertinent positives were addressed with patient today.  Reviewed by clinician on day of visit: allergies, medications, problem list, medical history, surgical history, family history, social history, and previous encounter notes.  Weight Summary and Biometrics   Weight Lost Since Last Visit: 0lb  Weight Gained Since Last Visit: 3lb    Vitals Temp:  98.2 F (36.8 C) BP: 128/82 Pulse Rate: 60 SpO2: 93 %   Anthropometric Measurements Height: 5\' 9"  (1.753 m) Weight: (!) 303 lb (137.4 kg) BMI (Calculated): 44.72 Weight at Last Visit: 300lb Weight Lost Since Last Visit: 0lb Weight Gained Since Last Visit: 3lb Starting Weight: 333lb Total Weight Loss (lbs): 28 lb (12.7 kg) Peak Weight: 358lb   Body Composition  Body Fat %: 53.3 % Fat Mass (lbs): 161.4 lbs Muscle Mass (lbs): 134.4 lbs Total Body Water (lbs): 108.4 lbs Visceral Fat Rating : 18   Other Clinical Data Fasting: no Labs: no Today's Visit #: 21 Starting Date: 06/20/21   Objective:   PHYSICAL EXAM: Blood pressure 128/82, pulse 60, temperature 98.2 F (36.8  C), height 5\' 9"  (1.753 m), weight (!) 303 lb (137.4 kg), last menstrual period 07/30/2017, SpO2 93 %. Body mass index is 44.75 kg/m.  General: Well Developed, well nourished, and in no acute distress.  HEENT: Normocephalic, atraumatic Skin: Warm and dry, cap RF less 2 sec, good turgor Chest:  Normal excursion, shape, no gross abn Respiratory: speaking in full sentences, no conversational dyspnea NeuroM-Sk: Ambulates w/o assistance, moves * 4 Psych: A and O *3, insight good, mood-full  DIAGNOSTIC DATA REVIEWED:  BMET    Component Value Date/Time   NA 144 06/24/2022 1047   K 3.5 06/24/2022 1047   CL 105 06/24/2022 1047   CO2 26 06/24/2022 1047   GLUCOSE 93 06/24/2022 1047   GLUCOSE 98 11/19/2021 1639   BUN 15 06/24/2022 1047   CREATININE 0.96 06/24/2022 1047   CALCIUM 9.1 06/24/2022 1047   GFRNONAA >60 11/19/2021 1639   GFRAA >60 01/25/2019 1100   Lab Results  Component Value Date   HGBA1C 6.3 (H) 06/24/2022   HGBA1C 6.0 (H) 06/20/2021   Lab Results  Component Value Date   INSULIN 12.1 06/24/2022   INSULIN 11.8 06/20/2021   Lab Results  Component Value Date   TSH 1.000 06/24/2022   CBC    Component Value Date/Time   WBC 5.5 06/24/2022 1047   WBC 6.3 11/19/2021 1639   RBC 4.54 06/24/2022 1047   RBC 4.48 11/19/2021 1639   HGB 11.7 06/24/2022 1047   HCT 37.3 06/24/2022 1047   PLT 220 06/24/2022 1047   MCV 82 06/24/2022 1047   MCH 25.8 (L) 06/24/2022 1047   MCH 25.9 (L) 11/19/2021 1639   MCHC 31.4 (L) 06/24/2022 1047   MCHC 30.9 11/19/2021 1639   RDW 16.4 (H) 06/24/2022 1047   Iron Studies    Component Value Date/Time   IRON 49 09/06/2021 1252   IRON 47 06/20/2021 0913   TIBC 273 09/06/2021 1252   TIBC 222 (L) 06/20/2021 0913   FERRITIN 224 09/06/2021 1252   FERRITIN 366 (H) 06/20/2021 0913   IRONPCTSAT 18 09/06/2021 1252   IRONPCTSAT 21 06/20/2021 0913   Lipid Panel     Component Value Date/Time   CHOL 180 06/24/2022 1047   TRIG 50 06/24/2022  1047   HDL 60 06/24/2022 1047   CHOLHDL 3.0 06/24/2022 1047   LDLCALC 110 (H) 06/24/2022 1047   Hepatic Function Panel     Component Value Date/Time   PROT 6.1 06/24/2022 1047   ALBUMIN 4.0 06/24/2022 1047   AST 20 06/24/2022 1047   ALT 15 06/24/2022 1047   ALKPHOS 95 06/24/2022 1047   BILITOT 0.3 06/24/2022 1047      Component Value Date/Time   TSH 1.000 06/24/2022 1047   Nutritional Lab Results  Component Value  Date   VD25OH 47.2 06/24/2022   VD25OH 48.2 12/31/2021   VD25OH 45.2 06/20/2021    Attestations:   I, Special Puri, acting as a Stage manager for [employer], DO., have compiled all relevant documentation for today's office visit on behalf of Thomasene Lot, DO, while in the presence of [employer], DO.  I have reviewed the above documentation for accuracy and completeness, and I agree with the above. Carlye Grippe, D.O.  The 21st Century Cures Act was signed into law in 2016 which includes the topic of electronic health records.  This provides immediate access to information in MyChart.  This includes consultation notes, operative notes, office notes, lab results and pathology reports.  If you have any questions about what you read please let us know at your next visit so we can discuss your concerns and take corrective action if need be.  We are right here with you.

## 2022-09-26 LAB — HEMOGLOBIN A1C
Est. average glucose Bld gHb Est-mCnc: 137 mg/dL
Hgb A1c MFr Bld: 6.4 % — ABNORMAL HIGH (ref 4.8–5.6)

## 2022-09-26 LAB — INSULIN, RANDOM: INSULIN: 10.9 u[IU]/mL (ref 2.6–24.9)

## 2022-09-26 LAB — VITAMIN D 25 HYDROXY (VIT D DEFICIENCY, FRACTURES): Vit D, 25-Hydroxy: 39.5 ng/mL (ref 30.0–100.0)

## 2022-10-14 ENCOUNTER — Encounter (INDEPENDENT_AMBULATORY_CARE_PROVIDER_SITE_OTHER): Payer: Self-pay | Admitting: Family Medicine

## 2022-10-16 ENCOUNTER — Ambulatory Visit (INDEPENDENT_AMBULATORY_CARE_PROVIDER_SITE_OTHER): Payer: Medicare Other | Admitting: Family Medicine

## 2022-10-23 ENCOUNTER — Other Ambulatory Visit: Payer: Self-pay | Admitting: Oncology

## 2022-10-23 ENCOUNTER — Other Ambulatory Visit (INDEPENDENT_AMBULATORY_CARE_PROVIDER_SITE_OTHER): Payer: Self-pay | Admitting: Family Medicine

## 2022-10-23 DIAGNOSIS — G43909 Migraine, unspecified, not intractable, without status migrainosus: Secondary | ICD-10-CM

## 2022-11-03 ENCOUNTER — Other Ambulatory Visit (INDEPENDENT_AMBULATORY_CARE_PROVIDER_SITE_OTHER): Payer: Self-pay | Admitting: Family Medicine

## 2022-11-03 DIAGNOSIS — I10 Essential (primary) hypertension: Secondary | ICD-10-CM

## 2022-11-04 ENCOUNTER — Encounter (INDEPENDENT_AMBULATORY_CARE_PROVIDER_SITE_OTHER): Payer: Self-pay | Admitting: Family Medicine

## 2022-11-06 ENCOUNTER — Ambulatory Visit (INDEPENDENT_AMBULATORY_CARE_PROVIDER_SITE_OTHER): Payer: Medicare Other | Admitting: Family Medicine

## 2022-11-07 ENCOUNTER — Ambulatory Visit (INDEPENDENT_AMBULATORY_CARE_PROVIDER_SITE_OTHER): Payer: Medicare Other | Admitting: Family Medicine

## 2022-11-11 NOTE — Progress Notes (Unsigned)
TeleHealth Visit:  This visit was completed with telemedicine (audio/video) technology. Michelle Mcmillan has verbally consented to this TeleHealth visit. The patient is located at home, the provider is located at home. The participants in this visit include the listed provider and patient. The visit was conducted today via MyChart video.  OBESITY Michelle Mcmillan is here to discuss her progress with her obesity treatment plan along with follow-up of her obesity related diagnoses.   Today's visit was # 22 Starting weight: 333 lbs Starting date: 06/20/21 Weight at last in office visit: 303 lbs on 09/25/22 Total weight loss: 30 lbs at last in office visit on 09/25/22. Today's reported weight (11/12/22): none reported  Nutrition Plan: the Category 3 plan and with breakfast and lunch options - 70% adherence.  Current exercise:  spin bike/walking 30 minutes 3-4 days per week   Interim History:  Having some knee pain so has cut back on using her spin bike.  Does well with plan at breakfast and lunch.  Does not always have protein and often does not have 8 ounces of protein due to save money.  Typical day of food: Breakfast: egg, [place] sausage, keto bread honey wheat Lunch: low carb wrap with chicken/turkey, lettuce tomato, apple, berries Dinner: meat and vegetable but meat serving is not 8 oz. Has pasta if money is tight. Snacks: Yasso bar  Has Librarian, academic app but has not used it yet. She does not qualify for SNAP benefits. Assessment/Plan:  We discussed recent lab results in depth.  1. Prediabetes Worsening. Last A1c was 6.4, up from 6.3.  Medication(s): None Discussed starting metformin in depth and she is resistant because she has heard of the GI side effects and people have told her that it affected their kidneys.  Lab Results  Component Value Date   HGBA1C 6.4 (H) 09/25/2022   HGBA1C 6.3 (H) 06/24/2022   HGBA1C 6.0 (H) 12/31/2021   HGBA1C 6.0 (H) 06/20/2021   Lab Results   Component Value Date   INSULIN 10.9 09/25/2022   INSULIN 12.1 06/24/2022   INSULIN 11.0 12/31/2021   INSULIN 11.8 06/20/2021    Plan: 1.  Reduce simple carbohydrates as much as her budget allows. 2.  Educated about metformin and handout sent via MyChart.  Tried to dispel the mists associated with metformin. 3.  Educated about how prediabetes can worsen despite exercise and weight loss.  2.  Migraine without status migrainous, not intractable Stable.  Feels the topiramate helps with emotional eating.  Plan: Refill topiramate 50 mg twice daily.  3. Vitamin D Deficiency Vitamin D is not at goal of 50.  Most recent vitamin D level was 39.5.  Level has declined the last 2 times it has been checked.. She is on OTC vitamin D3 2000 IU daily. Lab Results  Component Value Date   VD25OH 39.5 09/25/2022   VD25OH 47.2 06/24/2022   VD25OH 48.2 12/31/2021    Plan: Continue and increase dose OTC vitamin D3 2000 IU daily-take 2 pills daily.  4. Hypertension Hypertension well controlled.  Medication(s): HCTZ 12.5 mg daily  BP Readings from Last 3 Encounters:  09/25/22 128/82  09/10/22 111/74  09/02/22 121/82   Lab Results  Component Value Date   CREATININE 0.96 06/24/2022   CREATININE 0.83 12/31/2021   CREATININE 0.92 11/19/2021   No results found for: "GFR"  Plan: Refill HCTZ 12.5 mg daily.  5. Morbid Obesity: Current BMI 44  Michelle Mcmillan is currently in the action stage of change. As such, her goal  is to continue with weight loss efforts.  She has agreed to the Category 3 plan and with breakfast and lunch options.  1.  Discussed cost efficient options for protein and other foods on the meal plan. 2.  Encouraged her to try shopping at [facility] for frozen vegetables and low calorie bread. 3.  Handouts sent via MyChart-insulin/prediabetes and metformin.  Exercise goals:  as is  Behavioral modification strategies: increasing lean protein intake, decreasing simple carbohydrates ,  and keep healthy foods in the home.  Michelle Mcmillan has agreed to follow-up with our clinic in 4 weeks.  No orders of the defined types were placed in this encounter.   Medications Discontinued During This Encounter  Medication Reason   hydrochlorothiazide (MICROZIDE) 12.5 MG capsule Reorder   topiramate (TOPAMAX) 50 MG tablet Reorder     Meds ordered this encounter  Medications   Cholecalciferol (VITAMIN D3) 50 MCG (2000 UT) capsule    Sig: Take 2 capsules (4,000 Units total) by mouth daily.    Order Specific Question:   Supervising Provider    Answer:   Glennis Brink [[id number]]   topiramate (TOPAMAX) 50 MG tablet    Sig: one tab by mouth twice daily.    Dispense:  60 tablet    Refill:  0    Order Specific Question:   Supervising Provider    Answer:   Glennis Brink [[id number]]   hydrochlorothiazide (MICROZIDE) 12.5 MG capsule    Sig: Take 1 capsule (12.5 mg total) by mouth daily.    Dispense:  90 capsule    Refill:  0    Order Specific Question:   Supervising Provider    Answer:   Glennis Brink [[id number]]      Objective:   VITALS: Per patient if applicable, see vitals. GENERAL: Alert and in no acute distress. CARDIOPULMONARY: No increased WOB. Speaking in clear sentences.  PSYCH: Pleasant and cooperative. Speech normal rate and rhythm. Affect is appropriate. Insight and judgement are appropriate. Attention is focused, linear, and appropriate.  NEURO: Oriented as arrived to appointment on time with no prompting.   Attestation Statements:   Reviewed by clinician on day of visit: allergies, medications, problem list, medical history, surgical history, family history, social history, and previous encounter notes.  This was prepared with the assistance of Engineer, civil (consulting).  Occasional wrong-word or sound-a-like substitutions may have occurred due to the inherent limitations of voice recognition software.

## 2022-11-12 ENCOUNTER — Telehealth (INDEPENDENT_AMBULATORY_CARE_PROVIDER_SITE_OTHER): Payer: Medicare Other | Admitting: Family Medicine

## 2022-11-12 ENCOUNTER — Encounter (INDEPENDENT_AMBULATORY_CARE_PROVIDER_SITE_OTHER): Payer: Self-pay | Admitting: Family Medicine

## 2022-11-12 DIAGNOSIS — G43909 Migraine, unspecified, not intractable, without status migrainosus: Secondary | ICD-10-CM

## 2022-11-12 DIAGNOSIS — Z6841 Body Mass Index (BMI) 40.0 and over, adult: Secondary | ICD-10-CM

## 2022-11-12 DIAGNOSIS — E559 Vitamin D deficiency, unspecified: Secondary | ICD-10-CM

## 2022-11-12 DIAGNOSIS — I10 Essential (primary) hypertension: Secondary | ICD-10-CM | POA: Diagnosis not present

## 2022-11-12 DIAGNOSIS — R7303 Prediabetes: Secondary | ICD-10-CM | POA: Diagnosis not present

## 2022-11-12 MED ORDER — TOPIRAMATE 50 MG PO TABS
ORAL_TABLET | ORAL | 0 refills | Status: DC
Start: 2022-11-12 — End: 2022-12-12

## 2022-11-12 MED ORDER — HYDROCHLOROTHIAZIDE 12.5 MG PO CAPS: 12.5000 mg | ORAL_CAPSULE | Freq: Every day | ORAL | 0 refills | Status: AC

## 2022-11-12 MED ORDER — VITAMIN D3 50 MCG (2000 UT) PO CAPS
4000.0000 [IU] | ORAL_CAPSULE | Freq: Every day | ORAL | Status: DC
Start: 2022-11-12 — End: 2022-12-12

## 2022-12-02 ENCOUNTER — Other Ambulatory Visit (INDEPENDENT_AMBULATORY_CARE_PROVIDER_SITE_OTHER): Payer: Self-pay | Admitting: Family Medicine

## 2022-12-02 DIAGNOSIS — G43909 Migraine, unspecified, not intractable, without status migrainosus: Secondary | ICD-10-CM

## 2022-12-12 ENCOUNTER — Ambulatory Visit (INDEPENDENT_AMBULATORY_CARE_PROVIDER_SITE_OTHER): Payer: Medicare Other | Admitting: Family Medicine

## 2022-12-12 ENCOUNTER — Encounter (INDEPENDENT_AMBULATORY_CARE_PROVIDER_SITE_OTHER): Payer: Self-pay | Admitting: Family Medicine

## 2022-12-12 ENCOUNTER — Other Ambulatory Visit: Payer: Self-pay | Admitting: Pharmacist

## 2022-12-12 VITALS — BP 128/84 | HR 61 | Temp 98.0°F | Ht 69.0 in | Wt 302.0 lb

## 2022-12-12 DIAGNOSIS — Z6841 Body Mass Index (BMI) 40.0 and over, adult: Secondary | ICD-10-CM

## 2022-12-12 DIAGNOSIS — G43909 Migraine, unspecified, not intractable, without status migrainosus: Secondary | ICD-10-CM

## 2022-12-12 DIAGNOSIS — E559 Vitamin D deficiency, unspecified: Secondary | ICD-10-CM

## 2022-12-12 DIAGNOSIS — R7303 Prediabetes: Secondary | ICD-10-CM | POA: Diagnosis not present

## 2022-12-12 MED ORDER — VITAMIN D3 50 MCG (2000 UT) PO CAPS: 4000.0000 [IU] | ORAL_CAPSULE | Freq: Every day | ORAL | Status: AC

## 2022-12-12 MED ORDER — TOPIRAMATE 50 MG PO TABS
ORAL_TABLET | ORAL | 0 refills | Status: AC
Start: 2022-12-12 — End: ?

## 2022-12-12 NOTE — Progress Notes (Signed)
Carlye Grippe, D.O.  ABFM, ABOM Specializing in Clinical Bariatric Medicine  Office located at: [address] [place], [place]  [address]     Assessment and Plan:   Medications Discontinued During This Encounter  Medication Reason   topiramate (TOPAMAX) 50 MG tablet Reorder   Cholecalciferol (VITAMIN D3) 50 MCG (2000 UT) capsule Reorder     Meds ordered this encounter  Medications   topiramate (TOPAMAX) 50 MG tablet    Sig: one tab by mouth twice daily.    Dispense:  60 tablet    Refill:  0   Cholecalciferol (VITAMIN D3) 50 MCG (2000 UT) capsule    Sig: Take 2 capsules (4,000 Units total) by mouth daily.    Recheck Vitamin D and A1c next OV   Prediabetes Assessment & Plan: Lab Results  Component Value Date   HGBA1C 6.4 (H) 09/25/2022   HGBA1C 6.3 (H) 06/24/2022   HGBA1C 6.0 (H) 12/31/2021   INSULIN 10.9 09/25/2022   INSULIN 12.1 06/24/2022   INSULIN 11.0 12/31/2021    Last A1c was 6.4, up from 6.3. No current meds. Diet/exercise approach. Again discussed Metformin which can help [place] in the management of this disease process as well as with weight loss. Pt wishes to hold off on starting Metformin for now and will try to focus on increasing adherence to prudent nutritional plan. C/w weight loss efforts and will recheck A1c next OV.    Migraine without status migrainosus, not intractable, unspecified migraine type Assessment & Plan: Migraines are stable. Pt feels that Topamax 50 mg bid is helping with stress/emotional eating. Will refill Topamax today. Will continue to monitor condition as it relates to their weight loss journey.    Vitamin D deficiency Assessment & Plan: Lab Results  Component Value Date   VD25OH 39.5 09/25/2022   VD25OH 47.2 06/24/2022   VD25OH 48.2 12/31/2021   Pt currently taking OTC Vitamin D3 2,000 international units bid. C/w supplementation at current dose. Will recheck levels next OV.    BMI 45.0-49.9, adult (HCC)- current BMI  44.58 Morbid obesity (HCC)-start bmi 49.18/date 06/20/21 Assessment & Plan: Since last in person office visit on 09/25/22  patient's muscle mass has decreased by 0.6 lb. Fat mass has not changed. Total body water has decreased by 0.2 lb.  Counseling done on how various foods will affect these numbers and how to maximize success  Total lbs lost to date: 31 lbs  Total weight loss percentage to date: 9.21%   No change to meal plan - see Subjective   - I encouraged pt to contact either [facility], [place], [place], or [facility] for further information about bariatric surgery.    Behavioral Intervention Additional resources provided today:  Metformin Handout  & Healthy Eating Principles Handout.  Evidence-based interventions for health behavior change were utilized today including the discussion of self monitoring techniques, problem-solving barriers and SMART goal setting techniques.   Regarding patient's less desirable eating habits and patterns, we employed the technique of small changes.  Pt will specifically work on: NA  FOLLOW UP: Return ~1 mo with Dr.O. She was informed of the importance of frequent follow up visits to maximize her success with intensive lifestyle modifications for her multiple health conditions.  Subjective:   Chief complaint: Obesity Michelle Mcmillan is here to discuss her progress with her obesity treatment plan. She is on the Category 3 Plan with B/L options and states she is following her eating plan approximately 50% of the time.  She states she is not exercising.   Interval History:  Kimala Mccleese is here for a follow up office visit. Louella states that her meal plan adherence is 50% since she has been traveling frequently. Walked a lot while traveling. Reports dealing with stressors, which has made it difficult to prepare foods and make herself a priority. Has been contemplating weight loss surgery.   Pharmacotherapy for weight loss: She is currently taking  Wellbutrin  XL 150 mg daily & Topamax 50 mg bid   for medical weight loss.  Denies side effects.    Review of Systems:  Pertinent positives were addressed with patient today.  Reviewed by clinician on day of visit: allergies, medications, problem list, medical history, surgical history, family history, social history, and previous encounter notes.  Weight Summary and Biometrics   Weight Lost Since Last Visit: 1 lb  Weight Gained Since Last Visit: 0   Vitals Temp: 98 F (36.7 C) BP: 128/84 Pulse Rate: 61 SpO2: 97 %   Anthropometric Measurements Height: 5\' 9"  (1.753 m) Weight: (!) 302 lb (137 kg) BMI (Calculated): 44.58 Weight at Last Visit: 303 lb (09/25/2022 Last in office visit) Weight Lost Since Last Visit: 1 lb Weight Gained Since Last Visit: 0 Starting Weight: 333 lb Total Weight Loss (lbs): 31 lb (14.1 kg)   Body Composition  Body Fat %: 53.4 % Fat Mass (lbs): 161.4 lbs Muscle Mass (lbs): 133.8 lbs Total Body Water (lbs): 108.2 lbs Visceral Fat Rating : 18   Other Clinical Data Fasting: No Labs: No Today's Visit #: 23 Starting Date: 06/20/21   Objective:   PHYSICAL EXAM: Blood pressure 128/84, pulse 61, temperature 98 F (36.7 C), height 5\' 9"  (1.753 m), weight (!) 302 lb (137 kg), last menstrual period 07/30/2017, SpO2 97%. Body mass index is 44.6 kg/m.  General: Well Developed, well nourished, and in no acute distress.  HEENT: Normocephalic, atraumatic Skin: Warm and dry, cap RF less 2 sec, good turgor Chest:  Normal excursion, shape, no gross abn Respiratory: speaking in full sentences, no conversational dyspnea NeuroM-Sk: Ambulates w/o assistance, moves * 4 Psych: A and O *3, insight good, mood-full  DIAGNOSTIC DATA REVIEWED:  BMET    Component Value Date/Time   NA 144 06/24/2022 1047   K 3.5 06/24/2022 1047   CL 105 06/24/2022 1047   CO2 26 06/24/2022 1047   GLUCOSE 93 06/24/2022 1047   GLUCOSE 98 11/19/2021 1639   BUN 15 06/24/2022 1047    CREATININE 0.96 06/24/2022 1047   CALCIUM 9.1 06/24/2022 1047   GFRNONAA >60 11/19/2021 1639   GFRAA >60 01/25/2019 1100   Lab Results  Component Value Date   HGBA1C 6.4 (H) 09/25/2022   HGBA1C 6.0 (H) 06/20/2021   Lab Results  Component Value Date   INSULIN 10.9 09/25/2022   INSULIN 11.8 06/20/2021   Lab Results  Component Value Date   TSH 1.000 06/24/2022   CBC    Component Value Date/Time   WBC 5.5 06/24/2022 1047   WBC 6.3 11/19/2021 1639   RBC 4.54 06/24/2022 1047   RBC 4.48 11/19/2021 1639   HGB 11.7 06/24/2022 1047   HCT 37.3 06/24/2022 1047   PLT 220 06/24/2022 1047   MCV 82 06/24/2022 1047   MCH 25.8 (L) 06/24/2022 1047   MCH 25.9 (L) 11/19/2021 1639   MCHC 31.4 (L) 06/24/2022 1047   MCHC 30.9 11/19/2021 1639   RDW 16.4 (H) 06/24/2022 1047   Iron Studies    Component Value Date/Time  IRON 49 09/06/2021 1252   IRON 47 06/20/2021 0913   TIBC 273 09/06/2021 1252   TIBC 222 (L) 06/20/2021 0913   FERRITIN 224 09/06/2021 1252   FERRITIN 366 (H) 06/20/2021 0913   IRONPCTSAT 18 09/06/2021 1252   IRONPCTSAT 21 06/20/2021 0913   Lipid Panel     Component Value Date/Time   CHOL 180 06/24/2022 1047   TRIG 50 06/24/2022 1047   HDL 60 06/24/2022 1047   CHOLHDL 3.0 06/24/2022 1047   LDLCALC 110 (H) 06/24/2022 1047   Hepatic Function Panel     Component Value Date/Time   PROT 6.1 06/24/2022 1047   ALBUMIN 4.0 06/24/2022 1047   AST 20 06/24/2022 1047   ALT 15 06/24/2022 1047   ALKPHOS 95 06/24/2022 1047   BILITOT 0.3 06/24/2022 1047      Component Value Date/Time   TSH 1.000 06/24/2022 1047   Nutritional Lab Results  Component Value Date   VD25OH 39.5 09/25/2022   VD25OH 47.2 06/24/2022   VD25OH 48.2 12/31/2021    Attestations:   I, Special Puri, acting as a Stage manager for [employer], DO., have compiled all relevant documentation for today's office visit on behalf of Thomasene Lot, DO, while in the presence of [employer],  DO.  I have reviewed the above documentation for accuracy and completeness, and I agree with the above. Carlye Grippe, D.O.  The 21st Century Cures Act was signed into law in 2016 which includes the topic of electronic health records.  This provides immediate access to information in MyChart.  This includes consultation notes, operative notes, office notes, lab results and pathology reports.  If you have any questions about what you read please let us know at your next visit so we can discuss your concerns and take corrective action if need be.  We are right here with you.

## 2023-01-13 ENCOUNTER — Other Ambulatory Visit (INDEPENDENT_AMBULATORY_CARE_PROVIDER_SITE_OTHER): Payer: Self-pay | Admitting: Family Medicine

## 2023-01-13 DIAGNOSIS — G43909 Migraine, unspecified, not intractable, without status migrainosus: Secondary | ICD-10-CM

## 2023-01-14 ENCOUNTER — Other Ambulatory Visit: Payer: Self-pay | Admitting: Oncology

## 2023-01-16 ENCOUNTER — Ambulatory Visit (INDEPENDENT_AMBULATORY_CARE_PROVIDER_SITE_OTHER): Payer: Medicare Other | Admitting: Family Medicine

## 2023-01-24 ENCOUNTER — Ambulatory Visit: Payer: Medicare Other | Admitting: Plastic Surgery

## 2023-01-28 ENCOUNTER — Encounter: Payer: Self-pay | Admitting: Plastic Surgery

## 2023-01-28 ENCOUNTER — Ambulatory Visit: Payer: Medicare Other | Admitting: Plastic Surgery

## 2023-01-28 VITALS — BP 128/79 | HR 69 | Ht 69.0 in | Wt 304.0 lb

## 2023-01-28 DIAGNOSIS — Z08 Encounter for follow-up examination after completed treatment for malignant neoplasm: Secondary | ICD-10-CM

## 2023-01-28 DIAGNOSIS — Z853 Personal history of malignant neoplasm of breast: Secondary | ICD-10-CM | POA: Diagnosis not present

## 2023-01-28 DIAGNOSIS — Z9889 Other specified postprocedural states: Secondary | ICD-10-CM

## 2023-01-28 NOTE — Progress Notes (Signed)
Patient ID: Michelle Mcmillan, female    DOB: 1970/09/30, 52 y.o.   MRN: [id number]   Chief Complaint  Patient presents with   Follow-up   Breast Problem    The patient is a 52 year old female here with her husband for 1 year follow-up from breast reconstruction.  She had a lumpectomy with radiation of the right breast in 2017.  In 2019 she had a right mastectomy with Dr. 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  It was estrogen and progesterone positive and HER2 negative.  She then had a prophylactic mastectomy of the left side with Dr. Carolynne Edouard in 2019 and an expander placed on the left side.  She is 5 feet 9 inches tall and 304 pounds.  In March 2020 she underwent a right latissimus muscle flap with expander placement for reconstruction of the right breast.  In July 2020 she had removal of both expanders and placement of implants.  On the left side she has a Mentor smooth round high profile 800 cc implant and on the right side she has a Mentor smooth round high-profile gel 590 cc implant.  In January 2021 she underwent excision of a left breast contracture with lipo filling of both breasts.  Overall the patient is doing well and is pleased with her results.  She had nipple areola tattooing which looks really good.  The implants are would be eligible for ultrasound examination 3 to 4 years after placement which the patient may do in the next year or so.  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  Her only complaint is some shooting pain on the right lateral breast.  This may be the latissimus tightening due to the flap for the breast reconstruction.     Review of Systems  Constitutional: Negative.   HENT: Negative.    Eyes: Negative.   Respiratory: Negative.    Cardiovascular: Negative.   Gastrointestinal: Negative.   Endocrine: Negative.   Genitourinary: Negative.   Musculoskeletal:  Negative.     Past Medical History:  Diagnosis Date   Anemia    H/O   Anemia    Arthritis    RIGHT KNEE   Back pain    Breast cancer (HCC) 2017   right breast   Cancer (HCC)    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)   Carpal tunnel syndrome    Constipation    Depression    Dyspnea    GERD (gastroesophageal reflux disease)    NO MEDS   Headache    migraines   Hypertension    Intractable nausea and vomiting 08/11/2017   Joint pain    Last menstrual period (LMP) > 10 days ago 08/2015   Lower extremity edema    Migraines    Neuropathy    OSA (obstructive sleep apnea)    Personal history of radiation therapy    Prediabetes    SOB (shortness of breath)     Past Surgical History:  Procedure Laterality Date   BREAST BIOPSY Right 05/08/2017   invasive mamm. ca   BREAST EXCISIONAL BIOPSY Right 06/15/15   breast cancer   BREAST LUMPECTOMY WITH NEEDLE LOCALIZATION Right 06/15/2015   Procedure: BREAST LUMPECTOMY WITH NEEDLE LOCALIZATION;  Surgeon: Gladis Riffle, MD;  Location: [facility];  Service: General;  Laterality: Right;   BREAST RECONSTRUCTION WITH PLACEMENT OF TISSUE EXPANDER AND FLEX HD (ACELLULAR HYDRATED DERMIS)  Left 02/09/2018   Procedure: BREAST RECONSTRUCTION WITH PLACEMENT OF TISSUE EXPANDER AND FLEX HD (ACELLULAR HYDRATED DERMIS);  Surgeon: Peggye Form, DO;  Location: [facility];  Service: Plastics;  Laterality: Left;   BREAST SURGERY     CESAREAN SECTION  1988   x1   COLONOSCOPY WITH PROPOFOL N/A 01/23/2017   Procedure: COLONOSCOPY WITH PROPOFOL;  Surgeon: Toney Reil, MD;  Location: [facility];  Service: Gastroenterology;  Laterality: N/A;   ESOPHAGOGASTRODUODENOSCOPY (EGD) WITH PROPOFOL N/A 01/23/2017   Procedure: ESOPHAGOGASTRODUODENOSCOPY (EGD) WITH PROPOFOL;  Surgeon: Toney Reil, MD;  Location: [facility];  Service: Gastroenterology;  Laterality: N/A;   LATISSIMUS FLAP TO BREAST Right 06/01/2018   Procedure: RIGHT BREAST RECONSTRUCTION  WITH LATISSIMUS MYOCUTANEOUS FLAP;  Surgeon: Peggye Form, DO;  Location: [facility];  Service: Plastics;  Laterality: Right;   LIPOSUCTION WITH LIPOFILLING Bilateral 10/15/2018   Procedure: LIPOSUCTION TO BILATERAL BREASTS;  Surgeon: Peggye Form, DO;  Location: [facility];  Service: Plastics;  Laterality: Bilateral;   LIPOSUCTION WITH LIPOFILLING Bilateral 04/22/2019   Procedure: fat grafting/liposuction and lipofilling bilateral breasts;  Surgeon: Peggye Form, DO;  Location: [facility];  Service: Plastics;  Laterality: Bilateral;   MASS EXCISION N/A 05/20/2016   Procedure: EXCISION CHEST WALL MASS;  Surgeon: Lattie Haw, MD;  Location: [facility];  Service: General;  Laterality: N/A;   RECONSTRUCTION BREAST W/ LATISSIMUS DORSI FLAP Right 06/01/2018   REMOVAL OF BILATERAL TISSUE EXPANDERS WITH PLACEMENT OF BILATERAL BREAST IMPLANTS Bilateral 10/15/2018   Procedure: REMOVAL OF BILATERAL TISSUE EXPANDERS WITH PLACEMENT OF BILATERAL BREAST IMPLANTS;  Surgeon: Peggye Form, DO;  Location: [facility];  Service: Plastics;  Laterality: Bilateral;   SCAR REVISION Bilateral 04/22/2019   Procedure: Excision of bilateral scar contracture;  Surgeon: Peggye Form, DO;  Location: [facility];  Service: Plastics;  Laterality: Bilateral;  total case 90 min   SENTINEL NODE BIOPSY Right 06/15/2015   Procedure: SENTINEL NODE BIOPSY;  Surgeon: Gladis Riffle, MD;  Location: [facility];  Service: General;  Laterality: Right;   TOTAL MASTECTOMY Right 05/22/2017   Procedure: TOTAL MASTECTOMY;  Surgeon: Lattie Haw, MD;  Location: [facility];  Service: General;  Laterality: Right;   TOTAL MASTECTOMY Left 02/09/2018   Procedure: LEFT PROPHYLACTIC  MASTECTOMY;  Surgeon: Griselda Miner, MD;  Location: [facility];  Service: General;  Laterality: Left;   TUBAL LIGATION  2004   ULNAR NERVE TRANSPOSITION Left 01/29/2019    Procedure: LEFT ULNAR NERVE DECOMPRESSION/TRANSPOSITION;  Surgeon: Betha Loa, MD;  Location: [facility];  Service: Orthopedics;  Laterality: Left;      Current Outpatient Medications:    APPLE CIDER VINEGAR PO, Take by mouth. Two tabs daily, Disp: , Rfl:    buPROPion (WELLBUTRIN XL) 150 MG 24 hr tablet, Take 150 mg by mouth daily., Disp: , Rfl:    butalbital-aspirin-caffeine (FIORINAL) 50-325-40 MG capsule, Take 1 capsule by mouth as needed for headache., Disp: , Rfl:    calcium-vitamin D (OSCAL WITH D) 500-200 MG-UNIT TABS tablet, Take 1 tablet by mouth 2 (two) times daily. With meals, Disp: , Rfl:    Cholecalciferol (VITAMIN D3) 50 MCG (2000 UT) capsule, Take 2 capsules (4,000 Units total) by mouth daily., Disp: , Rfl:    diclofenac sodium (VOLTAREN) 1 % GEL, Apply 4 g topically 4 (four) times daily., Disp: , Rfl:    dorzolamide-timolol (COSOPT) 22.3-6.8 MG/ML ophthalmic solution, Place 1  drop into both eyes every morning., Disp: , Rfl:    ELDERBERRY PO, Take 50 mg by mouth daily., Disp: , Rfl:    Ferrous Sulfate (IRON) 325 (65 Fe) MG TABS, Take 1 tablet (325 mg total) by mouth 2 (two) times daily., Disp: 60 each, Rfl: 2   gabapentin (NEURONTIN) 300 MG capsule, TAKE 1 CAPSULE BY MOUTH 3 TIMES  DAILY AND MAY TAKE 2 CAPSULES AT BEDTIME IF MORE BOTHERSOME AT  NIGHT, Disp: 450 capsule, Rfl: 0   hydrochlorothiazide (MICROZIDE) 12.5 MG capsule, Take 1 capsule (12.5 mg total) by mouth daily., Disp: 90 capsule, Rfl: 0   ibuprofen (ADVIL) 800 MG tablet, TAKE 1 TABLET BY MOUTH EVERY 8 HOURS WITH FOOD AS NEEDED FOR PAIN, Disp: , Rfl:    latanoprost (XALATAN) 0.005 % ophthalmic solution, Place 1 drop into both eyes at bedtime., Disp: , Rfl:    Magnesium 400 MG CAPS, Take by mouth., Disp: , Rfl:    Melatonin 5 MG CAPS, Take 5 mg by mouth at bedtime., Disp: , Rfl:    omeprazole (PRILOSEC) 20 MG capsule, Take 1 capsule (20 mg total) by mouth daily., Disp: 30 capsule, Rfl: 3   rizatriptan  (MAXALT) 10 MG tablet, SMARTSIG:1 Tablet(s) By Mouth 1 to 2 Times Daily, Disp: , Rfl:    topiramate (TOPAMAX) 50 MG tablet, one tab by mouth twice daily., Disp: 60 tablet, Rfl: 0   HYDROcodone-acetaminophen (NORCO/VICODIN) 5-325 MG tablet, Take 1 tablet by mouth every 6 (six) hours as needed for severe pain. (Patient not taking: Reported on 01/28/2023), Disp: 5 tablet, Rfl: 0   letrozole (FEMARA) 2.5 MG tablet, TAKE 1 TABLET BY MOUTH DAILY (Patient not taking: Reported on 01/28/2023), Disp: 90 tablet, Rfl: 0   Objective:   Vitals:   01/28/23 1347  BP: 128/79  Pulse: 69  SpO2: 99%    Physical Exam Vitals reviewed.  Constitutional:      Appearance: Normal appearance.  Cardiovascular:     Rate and Rhythm: Normal rate.     Pulses: Normal pulses.  Pulmonary:     Effort: Pulmonary effort is normal.  Abdominal:     Palpations: Abdomen is soft.  Skin:    General: Skin is warm.     Capillary Refill: Capillary refill takes less than 2 seconds.  Neurological:     Mental Status: She is alert and oriented to person, place, and time.  Psychiatric:        Mood and Affect: Mood normal.        Behavior: Behavior normal.        Thought Content: Thought content normal.        Judgment: Judgment normal.     Assessment & Plan:  S/P breast reconstruction, bilateral  The patient will see me back in a year.  No changes for right now.  Continue with light massage.  Follow-up sooner if needed.  Will talk about ultrasound at her next visit.  Go ahead and set up physical therapy to see if we can get that right side loosened up and stop the spasming of the muscle.  Pictures were obtained of the patient and placed in the chart with the patient's or guardian's permission.   Alena Bills Alivia Cimino, DO

## 2023-02-13 ENCOUNTER — Ambulatory Visit (INDEPENDENT_AMBULATORY_CARE_PROVIDER_SITE_OTHER): Payer: Medicare Other | Admitting: Family Medicine

## 2023-03-12 DIAGNOSIS — H9313 Tinnitus, bilateral: Secondary | ICD-10-CM | POA: Insufficient documentation

## 2023-03-17 ENCOUNTER — Telehealth: Payer: Self-pay | Admitting: *Deleted

## 2023-03-17 NOTE — Telephone Encounter (Signed)
Received on (03/10/2023) via of fax DME Standard Written Order from [employer].  Requesting signature and return.  Given to provider to sign.   DME Standard Written Order signed and faxed back to [employer].  Confirmation received and copy scanned into the chart.//AB/CMA

## 2023-04-16 ENCOUNTER — Ambulatory Visit
Admission: RE | Admit: 2023-04-16 | Discharge: 2023-04-16 | Disposition: A | Discharge status: Home / Self Care | Payer: Medicare Other | Source: Ambulatory Visit | Attending: Family Medicine | Admitting: Family Medicine

## 2023-04-16 ENCOUNTER — Other Ambulatory Visit: Payer: Self-pay | Admitting: Family Medicine

## 2023-04-16 DIAGNOSIS — R109 Unspecified abdominal pain: Secondary | ICD-10-CM

## 2023-04-16 DIAGNOSIS — R10A1 Flank pain, right side: Secondary | ICD-10-CM | POA: Insufficient documentation

## 2023-04-25 ENCOUNTER — Telehealth (INDEPENDENT_AMBULATORY_CARE_PROVIDER_SITE_OTHER): Payer: Medicare Other | Admitting: Primary Care

## 2023-04-25 DIAGNOSIS — G4733 Obstructive sleep apnea (adult) (pediatric): Secondary | ICD-10-CM

## 2023-04-25 NOTE — Progress Notes (Signed)
Virtual Visit via Video Note  I connected with [name] on 04/25/23 at  1:30 PM EST by a video enabled telemedicine application and verified that I am speaking with the correct person using two identifiers.  Location: Patient: Home Provider: Office   I discussed the limitations of evaluation and management by telemedicine and the availability of in person appointments. The patient expressed understanding and agreed to proceed.  History of Present Illness: 53 year old female, never smoked. PMH significant for OSA, HTN, GERD, breast cancer s/p mastectomy, morbid obesity. HST 11/26/20 >> AHI 28.1, SpO2 low 72%.   Previous [facility] pulmonary encounter: 04/20/2021 Patient presents today for 3 month follow-up after CPAP start. She is doing well today. No acute complaints. She is 100% compliant with CPAP use. She is sleeping better at night and not as tired during the day. No issues with pressure setting or mask fit other than occasional airleak. She is using a hybrid full face mask size medium. She recently has started to exercise and is working on weight loss.   Airview download 03/19/21-04/17/21 Usage 30/30 days; 100% > 4 hours Average usage 8 hours 24 mins Pressure 5-15cm h20 (14.3cm h20-95%) Airleaks 4.8L/min (95%) AHI 2.4   05/17/2022 Patient presents today for annual follow-up for OSA. She is doing well. She still has some issues with mask fit, she has tried several mask but all irritate her . She is currently using nasal cradle mask and seems to do the best with this particular one.  No significant daytime sleepiness No significant snoring   Airview download 04/16/22-05/15/22 Usage 30/30 days; 100% > 4 hours Average usage 8 hours 15 mins Pressure 5-15cm h20 Airleaks 13.5L/min AHI 0.9   04/25/2023- Interim hx Discussed the use of AI scribe software for clinical note transcription with the patient, who gave verbal consent to proceed.  History of Present Illness   The patient, Miss  Michelle Mcmillan, with a history of moderate to severe sleep apnea, presents for a one-year follow-up. She has been compliant with her CPAP therapy, with only one instance of not meeting the complete four hours of use. The patient reports good sleep quality with the CPAP, averaging about eight to nine hours per night. However, she has been experiencing issues with her mask, specifically the headgear strap, which seems to be worn out and causing the mask to slip.  In addition to sleep apnea, the patient has been actively trying to manage her weight. She has successfully lost approximately 20 pounds over the past year through diet and exercise. Previously, she was on phentermine for weight loss, but it was discontinued due to the onset of tinnitus. The patient has expressed frustration with her weight loss journey and is considering lap band surgery.    Airview download 03/25/23-04/23/23 30/30 days (100%) > 4 hours Average usage 8 hours 44 mins Pressure 5-15cm h20 (13.2cm h20-95%) Airleaks 19.7L/min (95%) AHI 1.1  Observations/Objective:  Appears well without overt respiratory symptoms  Assessment and Plan:     Moderate obstructive Sleep Apnea Well controlled with CPAP therapy. She remains 100% compliant with an average use of 8-9 hours per night. Current pressure 5-15cm h20 with residual apnea score of 1.1/hour. Patient reports good sleep quality but is experiencing issues with mask fit due to worn out headgear. -Continue current CPAP settings. -Order new headgear and renew supplies through [employer]. If patient does not hear from [employer] within two weeks, patient to contact office via MyChart.  Obesity Patient has lost 20 pounds over  the past year through lifestyle modifications. Patient has previously tried phentermine but discontinued due to tinnitus. Patient expressed frustration with weight loss progress and interest in additional weight loss strategies. -Discussed potential use of Zepbound, a GLP1  receptor blocker approved for obesity in patients with sleep apnea. Patient to discuss with primary care provider. -Encourage continued lifestyle modifications for weight loss.  Follow Up Instructions:  Follow-up in one year unless patient experiences issues with CPAP therapy.     I discussed the assessment and treatment plan with the patient. The patient was provided an opportunity to ask questions and all were answered. The patient agreed with the plan and demonstrated an understanding of the instructions.   The patient was advised to call back or seek an in-person evaluation if the symptoms worsen or if the condition fails to improve as anticipated.  I provided 25 minutes of non-face-to-face time during this encounter.   Glenford Bayley, NP

## 2023-04-25 NOTE — Patient Instructions (Signed)
-  OBSTRUCTIVE SLEEP APNEA: Obstructive sleep apnea is a condition where the airway becomes blocked during sleep, causing breathing to stop and start repeatedly. Your sleep apnea is well controlled with CPAP therapy, and you are using it consistently. We will order new headgear for your mask to address the fit issues. If you do not hear from [employer] within two weeks, please contact our office via MyChart.  -OBESITY: Obesity is a condition characterized by excessive body fat. You have successfully lost 20 pounds through diet and exercise. We discussed the potential use of Zepbound, a medication approved for obesity in patients with sleep apnea. Please discuss this option with your primary care provider. Continue with your current lifestyle modifications for weight loss.  INSTRUCTIONS:  Follow up in one year unless you experience issues with your CPAP therapy.

## 2023-06-23 ENCOUNTER — Encounter: Payer: Self-pay | Admitting: Oncology

## 2023-09-10 ENCOUNTER — Encounter: Payer: Self-pay | Admitting: Oncology

## 2023-09-10 ENCOUNTER — Inpatient Hospital Stay: Attending: Oncology | Admitting: Oncology

## 2023-09-10 VITALS — BP 127/81 | HR 51 | Temp 96.8°F | Resp 18 | Ht 69.0 in | Wt 310.5 lb

## 2023-09-10 DIAGNOSIS — Z853 Personal history of malignant neoplasm of breast: Secondary | ICD-10-CM | POA: Insufficient documentation

## 2023-09-10 DIAGNOSIS — N644 Mastodynia: Secondary | ICD-10-CM | POA: Diagnosis not present

## 2023-09-10 DIAGNOSIS — Z170 Estrogen receptor positive status [ER+]: Secondary | ICD-10-CM | POA: Diagnosis not present

## 2023-09-10 DIAGNOSIS — C50411 Malignant neoplasm of upper-outer quadrant of right female breast: Secondary | ICD-10-CM

## 2023-09-10 DIAGNOSIS — Z803 Family history of malignant neoplasm of breast: Secondary | ICD-10-CM | POA: Insufficient documentation

## 2023-09-10 NOTE — Progress Notes (Signed)
 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   INTERVAL HISTORY: Patient returns to clinic today for routine yearly evaluation.  She is having increased right breast pain at the site of her reconstruction, but otherwise has felt well.  She has no neurologic complaints.  She denies any recent fevers or illnesses.  She has a good appetite and denies weight loss.  She has no chest pain, shortness of breath, cough, or hemoptysis.  She denies any nausea, vomiting, constipation, or diarrhea.  She has no urinary complaints.  Patient offers no further specific complaints today.  REVIEW OF SYSTEMS:   Review of Systems  Constitutional: Negative.  Negative for diaphoresis, fever, malaise/fatigue and weight loss.  Respiratory: Negative.  Negative for cough and shortness of breath.   Cardiovascular: Negative.  Negative for chest pain and leg swelling.  Gastrointestinal: Negative.  Negative for abdominal pain, constipation, diarrhea, nausea and vomiting.  Genitourinary: Negative.  Negative for dysuria.  Musculoskeletal: Negative.  Negative for joint pain and myalgias.  Skin: Negative.  Negative for rash.  Neurological: Negative.  Negative for tingling, sensory change, focal weakness and weakness.  Psychiatric/Behavioral: Negative.  The patient is not nervous/anxious and does not have insomnia.     As per HPI. Otherwise, a complete review of systems is negative.  PAST MEDICAL  HISTORY: Past Medical History:  Diagnosis Date   Anemia    H/O   Anemia    Arthritis    RIGHT KNEE   Back pain    Breast cancer (HCC) 2017   right breast   Cancer (HCC)    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)   Carpal tunnel syndrome    Constipation    Depression    Dyspnea    GERD (gastroesophageal reflux disease)    NO MEDS   Headache    migraines   Hypertension    Intractable nausea and vomiting 08/11/2017   Joint pain    Last menstrual period (LMP) > 10 days ago 08/2015   Lower extremity edema    Migraines    Neuropathy    OSA (obstructive sleep apnea)    Personal history of radiation therapy    Prediabetes    SOB (shortness of breath)     PAST SURGICAL HISTORY: Past Surgical History:  Procedure Laterality Date   BREAST BIOPSY Right 05/08/2017   invasive mamm. ca   BREAST EXCISIONAL BIOPSY Right 06/15/15   breast cancer   BREAST LUMPECTOMY WITH NEEDLE LOCALIZATION Right 06/15/2015   Procedure: BREAST LUMPECTOMY WITH NEEDLE LOCALIZATION;  Surgeon: Kandis Ormond, MD;  Location: [facility];  Service: General;  Laterality: Right;   BREAST RECONSTRUCTION WITH PLACEMENT OF TISSUE EXPANDER AND FLEX HD (ACELLULAR HYDRATED DERMIS) Left 02/09/2018   Procedure: BREAST RECONSTRUCTION WITH PLACEMENT OF TISSUE EXPANDER AND FLEX HD (ACELLULAR HYDRATED DERMIS);  Surgeon: Thornell Flirt, DO;  Location: [facility];  Service: Plastics;  Laterality: Left;   BREAST SURGERY     CESAREAN SECTION  1988   x1   COLONOSCOPY WITH PROPOFOL  N/A 01/23/2017  Procedure: COLONOSCOPY WITH PROPOFOL ;  Surgeon: Selena Daily, MD;  Location: [facility];  Service: Gastroenterology;  Laterality: N/A;   ESOPHAGOGASTRODUODENOSCOPY (EGD) WITH PROPOFOL  N/A 01/23/2017   Procedure: ESOPHAGOGASTRODUODENOSCOPY (EGD) WITH PROPOFOL ;  Surgeon: Selena Daily, MD;  Location: [facility];  Service: Gastroenterology;  Laterality: N/A;   LATISSIMUS FLAP TO BREAST Right 06/01/2018   Procedure: RIGHT BREAST  RECONSTRUCTION WITH LATISSIMUS MYOCUTANEOUS FLAP;  Surgeon: Thornell Flirt, DO;  Location: [facility];  Service: Plastics;  Laterality: Right;   LIPOSUCTION WITH LIPOFILLING Bilateral 10/15/2018   Procedure: LIPOSUCTION TO BILATERAL BREASTS;  Surgeon: Thornell Flirt, DO;  Location: [facility];  Service: Plastics;  Laterality: Bilateral;   LIPOSUCTION WITH LIPOFILLING Bilateral 04/22/2019   Procedure: fat grafting/liposuction and lipofilling bilateral breasts;  Surgeon: Thornell Flirt, DO;  Location: [facility];  Service: Plastics;  Laterality: Bilateral;   MASS EXCISION N/A 05/20/2016   Procedure: EXCISION CHEST WALL MASS;  Surgeon: Claudia Cuff, MD;  Location: [facility];  Service: General;  Laterality: N/A;   RECONSTRUCTION BREAST W/ LATISSIMUS DORSI FLAP Right 06/01/2018   REMOVAL OF BILATERAL TISSUE EXPANDERS WITH PLACEMENT OF BILATERAL BREAST IMPLANTS Bilateral 10/15/2018   Procedure: REMOVAL OF BILATERAL TISSUE EXPANDERS WITH PLACEMENT OF BILATERAL BREAST IMPLANTS;  Surgeon: Thornell Flirt, DO;  Location: [facility];  Service: Plastics;  Laterality: Bilateral;   SCAR REVISION Bilateral 04/22/2019   Procedure: Excision of bilateral scar contracture;  Surgeon: Thornell Flirt, DO;  Location: [facility];  Service: Plastics;  Laterality: Bilateral;  total case 90 min   SENTINEL NODE BIOPSY Right 06/15/2015   Procedure: SENTINEL NODE BIOPSY;  Surgeon: Kandis Ormond, MD;  Location: [facility];  Service: General;  Laterality: Right;   TOTAL MASTECTOMY Right 05/22/2017   Procedure: TOTAL MASTECTOMY;  Surgeon: Claudia Cuff, MD;  Location: [facility];  Service: General;  Laterality: Right;   TOTAL MASTECTOMY Left 02/09/2018   Procedure: LEFT PROPHYLACTIC  MASTECTOMY;  Surgeon: Caralyn Chandler, MD;  Location: [facility];  Service: General;  Laterality: Left;   TUBAL LIGATION  2004   ULNAR NERVE TRANSPOSITION Left  01/29/2019   Procedure: LEFT ULNAR NERVE DECOMPRESSION/TRANSPOSITION;  Surgeon: Brunilda Capra, MD;  Location: [facility];  Service: Orthopedics;  Laterality: Left;    FAMILY HISTORY Family History  Problem Relation Age of Onset   Hypertension Mother    Obesity Mother    Hypertension Father    Diabetes Father    Hypertension Sister    Breast cancer Paternal Aunt        dx 29s   Breast cancer Paternal Aunt        dx 32s   Breast cancer Cousin 56       ADVANCED DIRECTIVES:    HEALTH MAINTENANCE: Social History   Tobacco Use   Smoking status: Never   Smokeless tobacco: Never  Vaping Use   Vaping status: Never Used  Substance Use Topics   Alcohol use: Not Currently   Drug use: No    Allergies  Allergen Reactions   Nitroglycerin Other (See Comments)    Headache, n/v.    Current Outpatient Medications  Medication Sig Dispense Refill   APPLE CIDER VINEGAR PO Take by mouth. Two tabs daily     buPROPion  (WELLBUTRIN  XL) 150 MG 24 hr tablet Take 150 mg by mouth daily.     Cholecalciferol (VITAMIN D3) 50 MCG (2000 UT) capsule Take 2 capsules (4,000  Units total) by mouth daily.     cyclobenzaprine (FLEXERIL) 10 MG tablet Take 10 mg by mouth 3 (three) times daily.     diclofenac  sodium (VOLTAREN ) 1 % GEL Apply 4 g topically 4 (four) times daily.     dorzolamide-timolol (COSOPT) 22.3-6.8 MG/ML ophthalmic solution Place 1 drop into both eyes every morning.     ELDERBERRY PO Take 50 mg by mouth daily.     Ferrous Sulfate (IRON ) 325 (65 Fe) MG TABS Take 1 tablet (325 mg total) by mouth 2 (two) times daily. 60 each 2   gabapentin  (NEURONTIN ) 300 MG capsule TAKE 1 CAPSULE BY MOUTH 3 TIMES  DAILY AND MAY TAKE 2 CAPSULES AT BEDTIME IF MORE BOTHERSOME AT  NIGHT 450 capsule 0   hydrochlorothiazide  (MICROZIDE ) 12.5 MG capsule Take 1 capsule (12.5 mg total) by mouth daily. 90 capsule 0   ibuprofen  (ADVIL ) 800 MG tablet TAKE 1 TABLET BY MOUTH EVERY 8 HOURS WITH FOOD AS NEEDED  FOR PAIN     latanoprost (XALATAN) 0.005 % ophthalmic solution Place 1 drop into both eyes at bedtime.     Magnesium  400 MG CAPS Take by mouth.     Melatonin 5 MG CAPS Take 5 mg by mouth at bedtime.     omeprazole  (PRILOSEC) 20 MG capsule Take 1 capsule (20 mg total) by mouth daily. 30 capsule 3   rizatriptan (MAXALT) 10 MG tablet SMARTSIG:1 Tablet(s) By Mouth 1 to 2 Times Daily     topiramate  (TOPAMAX ) 50 MG tablet one tab by mouth twice daily. 60 tablet 0   No current facility-administered medications for this visit.    OBJECTIVE: Vitals:   09/10/23 1035  BP: 127/81  Pulse: (!) 51  Resp: 18  Temp: (!) 96.8 F (36 C)  SpO2: 100%     Body mass index is 45.85 kg/m.    ECOG FS:0 - Asymptomatic  General: Well-developed, well-nourished, no acute distress. Eyes: Pink conjunctiva, anicteric sclera. HEENT: Normocephalic, moist mucous membranes. Breast: Bilateral mastectomy with reconstruction. Lungs: No audible wheezing or coughing. Heart: Regular rate and rhythm. Abdomen: Soft, nontender, no obvious distention. Musculoskeletal: No edema, cyanosis, or clubbing. Neuro: Alert, answering all questions appropriately. Cranial nerves grossly intact. Skin: No rashes or petechiae noted. Psych: Normal affect.  LAB RESULTS:  Lab Results  Component Value Date   NA 144 06/24/2022   K 3.5 06/24/2022   CL 105 06/24/2022   CO2 26 06/24/2022   GLUCOSE 93 06/24/2022   BUN 15 06/24/2022   CREATININE 0.96 06/24/2022   CALCIUM  9.1 06/24/2022   PROT 6.1 06/24/2022   ALBUMIN 4.0 06/24/2022   AST 20 06/24/2022   ALT 15 06/24/2022   ALKPHOS 95 06/24/2022   BILITOT 0.3 06/24/2022   GFRNONAA >60 11/19/2021   GFRAA >60 01/25/2019    Lab Results  Component Value Date   WBC 5.5 06/24/2022   NEUTROABS 3.4 06/24/2022   HGB 11.7 06/24/2022   HCT 37.3 06/24/2022   MCV 82 06/24/2022   PLT 220 06/24/2022     STUDIES: No results found.   ASSESSMENT:  Pathologic stage Ia ER/PR positive,  HER-2 negative adenocarcinoma of the upper outer quadrant of the right breast. BRCA 1 and 2 negative, Oncotype DX score 17 which is considered low risk.  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  This occurred while patient was taking Aromasin . Oncotype was not sent on her second breast cancer.  Previously, CT scans and bone scans reviewed independently with no evidence of metastatic disease confirming stage I malignancy.  Patient completed cycle 5 of Taxotere  and Cytoxan  on September 16, 2017.  She received 1 additional cycle secondary to a reaction she had during cycle 1 and did not complete treatment at that time.  She has now completed reconstruction.  Patient does not require additional mammograms given her bilateral mastectomy, but is followed by routine ultrasound by plastic surgery.  Patient completed 5 years of letrozole  in approximately June 2024.  No intervention is needed.  No further follow-up has been scheduled.  Genetic testing: Patient was found to be BRCA 1 and 2 negative. She expressed understanding that although her genetic testing was negative, her children are at higher risk for breast cancer than the general population.  Bone health: Patient's most recent bone mineral density in September 05, 2022 reported T-score of 0.1 which continues to be normal.  No intervention needed.  Bone mineral densities can now be monitored by patient's primary care physician.   Breast pain: Unclear etiology.  Patient does not have an appointment with plastic surgery until October 2025, have sent a request to have patient possibly evaluated sooner.  Patient expressed understanding and was in agreement with this plan. She also understands that She can call clinic at any time with any questions,  concerns, or complaints.    Shellie Dials, MD 09/10/23 1:03 PM

## 2023-09-10 NOTE — Progress Notes (Signed)
 Having on and off right breast pain that she describes as excruciating when it occurs. Has flank pain in right side as well that has been going on for 3-4 months.

## 2023-09-11 ENCOUNTER — Inpatient Hospital Stay

## 2023-09-11 NOTE — Progress Notes (Signed)
 [facility] Clinical Social Work  Clinical Social Work was referred by Dr. Adrian Alba for advance directives. Clinical Social Worker contacted patient by phone.  Discussed the process of completing advance directives.  She is unable to meet during the clinic, but will meet on Monday, June 16th at 10am.      Kennth Peal, LCSW  Clinical Social Worker [facility]

## 2023-09-15 ENCOUNTER — Inpatient Hospital Stay

## 2023-09-15 NOTE — Progress Notes (Signed)
 [facility] Healthcare Advance Directives Clinical Social Work  Patient presented to Advance Directives Clinic  to review and complete healthcare advance directives.  Clinical Social Worker met with patient.  The patient designated Drexel Gentles Junior Guillot as their primary healthcare agent and Marisa Dianne McCoy as their secondary agent.  Patient also completed healthcare living will.    Documents were notarized and copies made for patient/family. Clinical Social Worker will send documents to medical records to be scanned into patient's chart. Clinical Social Worker encouraged patient/family to contact with any additional questions or concerns.   Kennth Peal, LCSW Clinical Social Worker [facility]

## 2023-09-23 ENCOUNTER — Other Ambulatory Visit: Payer: Self-pay | Admitting: Oncology

## 2023-09-25 ENCOUNTER — Encounter: Payer: Self-pay | Admitting: Oncology

## 2023-09-30 ENCOUNTER — Encounter: Payer: Self-pay | Admitting: Plastic Surgery

## 2023-09-30 ENCOUNTER — Ambulatory Visit (INDEPENDENT_AMBULATORY_CARE_PROVIDER_SITE_OTHER): Admitting: Plastic Surgery

## 2023-09-30 VITALS — BP 150/93 | HR 68 | Ht 69.0 in | Wt 312.0 lb

## 2023-09-30 DIAGNOSIS — Z9012 Acquired absence of left breast and nipple: Secondary | ICD-10-CM

## 2023-09-30 DIAGNOSIS — N6489 Other specified disorders of breast: Secondary | ICD-10-CM

## 2023-09-30 DIAGNOSIS — Z923 Personal history of irradiation: Secondary | ICD-10-CM | POA: Diagnosis not present

## 2023-09-30 DIAGNOSIS — Z9013 Acquired absence of bilateral breasts and nipples: Secondary | ICD-10-CM

## 2023-09-30 DIAGNOSIS — N644 Mastodynia: Secondary | ICD-10-CM | POA: Insufficient documentation

## 2023-09-30 DIAGNOSIS — Z9011 Acquired absence of right breast and nipple: Secondary | ICD-10-CM

## 2023-09-30 NOTE — Progress Notes (Signed)
 Subjective:    Patient ID: Michelle Mcmillan, female    DOB: Aug 20, 1970, 53 y.o.   MRN: [id number]  The patient is a 53 year old female here for evaluation of her breasts and back.  The patient is here with her husband.  Her history is that she had a right sided lumpectomy with radiation and then ended up with bilateral mastectomies in 2019.  Due to the radiation on the right side she ended up with a latissimus for part of her reconstruction on the right.  She has Mentor smooth round high-profile gel 800 cc implant on the left and a Mentor smooth round high-profile gel 590 cc implant in on the right.  Those were placed in 2020.  Then in January 2021 she had fat grafting for improved symmetry.  She had nipple areola tattooing.  The patient states that she has had some increase in pain on the right breast and in the right back.  She has some on the left breast at the inframammary fold.  She says sometimes it takes her breath away.  She is not aware of anything she is doing but said it is not out of the question that it might be activity related.  She wants to make sure that there is not anything going on from an implant standpoint or certainly no chance of recurrence.      Review of Systems  Constitutional:  Positive for activity change. Negative for appetite change.  Eyes: Negative.   Respiratory: Negative.    Cardiovascular: Negative.   Gastrointestinal: Negative.   Endocrine: Negative.   Genitourinary: Negative.   Musculoskeletal:  Positive for back pain.       Objective:   Physical Exam Vitals reviewed.  HENT:     Head: Atraumatic.   Cardiovascular:     Rate and Rhythm: Normal rate.     Pulses: Normal pulses.  Pulmonary:     Effort: Pulmonary effort is normal.  Abdominal:     Palpations: Abdomen is soft.   Skin:    General: Skin is warm.     Capillary Refill: Capillary refill takes less than 2 seconds.     Coloration: Skin is not jaundiced.     Findings: No bruising.    Neurological:     Mental Status: She is alert and oriented to person, place, and time.   Psychiatric:        Mood and Affect: Mood normal.        Behavior: Behavior normal.        Thought Content: Thought content normal.        Judgment: Judgment normal.        Assessment & Plan:     ICD-10-CM   1. Acquired absence of right breast  Z90.11     2. Acquired absence of left breast  Z90.12     3. Postoperative breast asymmetry  N64.89     4. Breast pain in female  N64.4        Patient is open to physical therapy so I like to go ahead and get that ordered to see if that may help with muscle tightness and some rib pain.  Will do an ultrasound on both sides and an MRI as that is the standard of care for evaluation of silicone implants.  Will plan on talking in about a month to make sure nothing falls through the cracks.  Another idea would be for pain management if needed.  Pictures were obtained of the patient  and placed in the chart with the patient's or guardian's permission.

## 2023-10-01 ENCOUNTER — Other Ambulatory Visit: Payer: Self-pay | Admitting: Student

## 2023-10-01 DIAGNOSIS — Z9889 Other specified postprocedural states: Secondary | ICD-10-CM

## 2023-10-01 DIAGNOSIS — Z9011 Acquired absence of right breast and nipple: Secondary | ICD-10-CM

## 2023-10-01 NOTE — Progress Notes (Signed)
 Updated Orders

## 2023-11-04 ENCOUNTER — Ambulatory Visit
Admission: RE | Admit: 2023-11-04 | Discharge: 2023-11-04 | Disposition: A | Discharge status: Home / Self Care | Source: Ambulatory Visit | Attending: Student | Admitting: Student

## 2023-11-04 DIAGNOSIS — Z9889 Other specified postprocedural states: Secondary | ICD-10-CM

## 2023-11-04 MED ORDER — GADOPICLENOL 0.5 MMOL/ML IV SOLN
10.0000 mL | Freq: Once | INTRAVENOUS | Status: AC | PRN
  Administered 2023-11-04: 10 mL via INTRAVENOUS

## 2023-11-14 ENCOUNTER — Telehealth: Admitting: Plastic Surgery

## 2023-11-14 ENCOUNTER — Encounter: Payer: Self-pay | Admitting: Plastic Surgery

## 2023-11-14 DIAGNOSIS — Z9012 Acquired absence of left breast and nipple: Secondary | ICD-10-CM

## 2023-11-14 DIAGNOSIS — N644 Mastodynia: Secondary | ICD-10-CM

## 2023-11-14 DIAGNOSIS — Z9013 Acquired absence of bilateral breasts and nipples: Secondary | ICD-10-CM

## 2023-11-14 NOTE — Progress Notes (Signed)
   Subjective:    Patient ID: Michelle Mcmillan, female    DOB: 02-02-1971, 53 y.o.   MRN: [id number]  The patient is a 53 year old female joining me by phone.  She underwent breast reconstruction with a latissimus muscle flap on the right side.  She had implant reconstruction on the left.  The right side needed the latissimus because she was radiated and her skin would not stretch.  She has a Dance movement psychotherapist smooth round high-profile gel 800 cc implant in on the left and a 590 cc high-profile gel implant in on the right.  Those were placed in 2020.  The patient states that she is having quite a bit of pain on the right side in the area of where we got the latissimus muscle from.  We went ahead and did an MRI which was done in August and the results were negative.  The patient is interested in the possibility of a block to see if that will stop the cycle of pain.      Review of Systems  Constitutional:  Positive for activity change. Negative for appetite change.  Eyes: Negative.   Respiratory: Negative.    Cardiovascular: Negative.   Gastrointestinal: Negative.        Objective:   Physical Exam      Assessment & Plan:     ICD-10-CM   1. Breast pain in female  N64.4     2. S/P mastectomy, bilateral  Z90.13     3. Acquired absence of left breast  Z90.12       I connected with  Michelle Mcmillan on 11/14/23 by phone and verified that I am speaking with the correct person using two identifiers.  The patient was at home and I was at the office.  We spent 5 minutes in discussion.  I will request pain management consult in [place] to see if we can make some headway.  The patient knows to contact me once that consult is finished so we can discuss any other options.   I discussed the limitations of evaluation and management by telemedicine. The patient expressed understanding and agreed to proceed.

## 2023-11-17 NOTE — Addendum Note (Signed)
 Addended by: EPIFANIO LAYMON HERO on: 11/17/2023 10:06 AM   Modules accepted: Orders

## 2023-12-30 NOTE — Progress Notes (Unsigned)
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 face-to-face and non-face-to-face time personally spent by the physician and/or other qualified health care professional(s) on the day of the encounter (includes time in activities that require the physician or other qualified health care professional and does not include time in activities normally performed by clinical staff). Physician's time may include the following activities when performed: Preparing to see the patient (e.g., pre-charting review of records, searching for previously ordered imaging, lab work, and nerve conduction tests) Review of prior analgesic pharmacotherapies. Reviewing PMP Interpreting ordered tests (e.g., lab work, imaging,  nerve conduction tests) Performing post-procedure evaluations, including interpretation of diagnostic procedures Obtaining and/or reviewing separately obtained history Performing a medically appropriate examination and/or evaluation Counseling and educating the patient/family/caregiver Ordering medications, tests, or procedures Referring and communicating with other health care professionals (when not separately reported) Documenting clinical information in the electronic or other health record Independently interpreting results (not separately reported) and communicating results to the patient/ family/caregiver Care coordination (not separately reported)  Note by: Glisson Mcmillan Tanya, MD (TTS and AI technology used. I  apologize for any typographical errors that were not detected and corrected.) Date: 12/31/2023; Time: 3:33 PM

## 2023-12-31 ENCOUNTER — Ambulatory Visit (HOSPITAL_BASED_OUTPATIENT_CLINIC_OR_DEPARTMENT_OTHER): Admitting: Pain Medicine

## 2023-12-31 ENCOUNTER — Ambulatory Visit
Admission: RE | Admit: 2023-12-31 | Discharge: 2023-12-31 | Disposition: A | Discharge status: Home / Self Care | Source: Ambulatory Visit | Attending: Pain Medicine | Admitting: Pain Medicine

## 2023-12-31 ENCOUNTER — Encounter: Payer: Self-pay | Admitting: Pain Medicine

## 2023-12-31 VITALS — BP 140/101 | HR 65 | Temp 98.4°F | Ht 69.0 in | Wt 310.0 lb

## 2023-12-31 DIAGNOSIS — T85848A Pain due to other internal prosthetic devices, implants and grafts, initial encounter: Secondary | ICD-10-CM

## 2023-12-31 DIAGNOSIS — G4486 Cervicogenic headache: Secondary | ICD-10-CM

## 2023-12-31 DIAGNOSIS — M1712 Unilateral primary osteoarthritis, left knee: Secondary | ICD-10-CM

## 2023-12-31 DIAGNOSIS — R519 Headache, unspecified: Secondary | ICD-10-CM | POA: Diagnosis present

## 2023-12-31 DIAGNOSIS — M542 Cervicalgia: Secondary | ICD-10-CM | POA: Diagnosis present

## 2023-12-31 DIAGNOSIS — H9190 Unspecified hearing loss, unspecified ear: Secondary | ICD-10-CM | POA: Insufficient documentation

## 2023-12-31 DIAGNOSIS — G894 Chronic pain syndrome: Secondary | ICD-10-CM | POA: Diagnosis present

## 2023-12-31 DIAGNOSIS — M549 Dorsalgia, unspecified: Secondary | ICD-10-CM | POA: Insufficient documentation

## 2023-12-31 DIAGNOSIS — Z9882 Breast implant status: Secondary | ICD-10-CM | POA: Diagnosis present

## 2023-12-31 DIAGNOSIS — Z9889 Other specified postprocedural states: Secondary | ICD-10-CM | POA: Insufficient documentation

## 2023-12-31 DIAGNOSIS — M7062 Trochanteric bursitis, left hip: Secondary | ICD-10-CM | POA: Diagnosis not present

## 2023-12-31 DIAGNOSIS — G8929 Other chronic pain: Secondary | ICD-10-CM | POA: Insufficient documentation

## 2023-12-31 DIAGNOSIS — E559 Vitamin D deficiency, unspecified: Secondary | ICD-10-CM

## 2023-12-31 DIAGNOSIS — M546 Pain in thoracic spine: Secondary | ICD-10-CM | POA: Diagnosis not present

## 2023-12-31 DIAGNOSIS — M7072 Other bursitis of hip, left hip: Secondary | ICD-10-CM | POA: Insufficient documentation

## 2023-12-31 DIAGNOSIS — M25552 Pain in left hip: Secondary | ICD-10-CM | POA: Diagnosis present

## 2023-12-31 DIAGNOSIS — Z9013 Acquired absence of bilateral breasts and nipples: Secondary | ICD-10-CM

## 2023-12-31 DIAGNOSIS — M899 Disorder of bone, unspecified: Secondary | ICD-10-CM

## 2023-12-31 DIAGNOSIS — Z710 Person encountering health services to consult on behalf of another person: Secondary | ICD-10-CM | POA: Insufficient documentation

## 2023-12-31 DIAGNOSIS — Z79899 Other long term (current) drug therapy: Secondary | ICD-10-CM | POA: Insufficient documentation

## 2023-12-31 DIAGNOSIS — M25562 Pain in left knee: Secondary | ICD-10-CM | POA: Diagnosis present

## 2023-12-31 DIAGNOSIS — R10A1 Flank pain, right side: Secondary | ICD-10-CM | POA: Diagnosis present

## 2023-12-31 DIAGNOSIS — Z789 Other specified health status: Secondary | ICD-10-CM | POA: Insufficient documentation

## 2023-12-31 DIAGNOSIS — Z659 Problem related to unspecified psychosocial circumstances: Secondary | ICD-10-CM | POA: Insufficient documentation

## 2023-12-31 NOTE — Patient Instructions (Signed)

## 2023-12-31 NOTE — Progress Notes (Signed)
 Safety precautions to be maintained throughout the outpatient stay will include: orient to surroundings, keep bed in low position, maintain call bell within reach at all times, provide assistance with transfer out of bed and ambulation.

## 2024-01-03 LAB — COMPLIANCE DRUG ANALYSIS, UR

## 2024-01-08 LAB — COMP. METABOLIC PANEL (12)
AST: 21 IU/L (ref 0–40)
Albumin: 3.8 g/dL (ref 3.8–4.9)
Alkaline Phosphatase: 112 IU/L (ref 49–135)
BUN/Creatinine Ratio: 12 (ref 9–23)
BUN: 12 mg/dL (ref 6–24)
Bilirubin Total: <0.2 mg/dL (ref 0.0–1.2)
Calcium: 9 mg/dL (ref 8.7–10.2)
Chloride: 105 mmol/L (ref 96–106)
Creatinine, Ser: 0.97 mg/dL (ref 0.57–1.00)
Globulin, Total: 2.1 g/dL (ref 1.5–4.5)
Glucose: 117 mg/dL — ABNORMAL HIGH (ref 70–99)
Potassium: 4.2 mmol/L (ref 3.5–5.2)
Sodium: 145 mmol/L — ABNORMAL HIGH (ref 134–144)
Total Protein: 5.9 g/dL — ABNORMAL LOW (ref 6.0–8.5)
eGFR: 69 mL/min/1.73 (ref >59)

## 2024-01-08 LAB — 25-HYDROXY VITAMIN D LCMS D2+D3
25-Hydroxy, Vitamin D-2: <1 ng/mL
25-Hydroxy, Vitamin D-3: 40 ng/mL
25-Hydroxy, Vitamin D: 40 ng/mL

## 2024-01-08 LAB — MAGNESIUM: Magnesium: 1.9 mg/dL (ref 1.6–2.3)

## 2024-01-08 LAB — C-REACTIVE PROTEIN: CRP: 2 mg/L (ref 0–10)

## 2024-01-08 LAB — VITAMIN B12: Vitamin B-12: 934 pg/mL (ref 232–1245)

## 2024-01-08 LAB — SEDIMENTATION RATE: Sed Rate: 44 mm/h — ABNORMAL HIGH (ref 0–40)

## 2024-01-19 DIAGNOSIS — L989 Disorder of the skin and subcutaneous tissue, unspecified: Secondary | ICD-10-CM | POA: Insufficient documentation

## 2024-01-27 ENCOUNTER — Ambulatory Visit: Payer: Medicare Other | Admitting: Plastic Surgery

## 2024-02-02 ENCOUNTER — Encounter: Payer: Self-pay | Admitting: Pain Medicine

## 2024-02-02 ENCOUNTER — Ambulatory Visit: Attending: Pain Medicine | Admitting: Pain Medicine

## 2024-02-02 VITALS — BP 127/96 | HR 66 | Temp 97.3°F | Resp 14 | Ht 69.0 in | Wt 307.0 lb

## 2024-02-02 DIAGNOSIS — S32020A Wedge compression fracture of second lumbar vertebra, initial encounter for closed fracture: Secondary | ICD-10-CM | POA: Diagnosis not present

## 2024-02-02 DIAGNOSIS — G8929 Other chronic pain: Secondary | ICD-10-CM | POA: Insufficient documentation

## 2024-02-02 DIAGNOSIS — M546 Pain in thoracic spine: Secondary | ICD-10-CM | POA: Diagnosis present

## 2024-02-02 DIAGNOSIS — S32030A Wedge compression fracture of third lumbar vertebra, initial encounter for closed fracture: Secondary | ICD-10-CM

## 2024-02-02 DIAGNOSIS — S32020S Wedge compression fracture of second lumbar vertebra, sequela: Secondary | ICD-10-CM | POA: Insufficient documentation

## 2024-02-02 DIAGNOSIS — T85848A Pain due to other internal prosthetic devices, implants and grafts, initial encounter: Secondary | ICD-10-CM

## 2024-02-02 DIAGNOSIS — R10A1 Flank pain, right side: Secondary | ICD-10-CM | POA: Diagnosis present

## 2024-02-02 DIAGNOSIS — Z853 Personal history of malignant neoplasm of breast: Secondary | ICD-10-CM

## 2024-02-02 NOTE — Progress Notes (Signed)
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  (N/A, 01/23/2017); Esophagogastroduodenoscopy (egd) with propofol  (N/A, 01/23/2017); Breast excisional biopsy (Right, 06/15/15); Breast biopsy (Right, 05/08/2017); Total mastectomy (Right, 05/22/2017); Total mastectomy (Left, 02/09/2018); Breast reconstruction with placement of tissue expander and flex hd (acellular hydrated dermis) (Left, 02/09/2018); Reconstruction breast w/ latissimus dorsi flap (Right, 06/01/2018); Latissimus flap to breast (Right, 06/01/2018); Removal of bilateral tissue expanders with placement of bilateral breast implants (Bilateral, 10/15/2018); Liposuction with lipofilling (Bilateral, 10/15/2018); Ulnar nerve transposition (Left, 01/29/2019); Scar revision (Bilateral, 04/22/2019); and Liposuction with lipofilling (Bilateral,  04/22/2019). Family: family history includes Breast cancer in her paternal aunt and paternal aunt; Breast cancer (age of onset: 33) in her cousin; Diabetes in her father; Hypertension in her father, mother, and sister; Obesity in her mother.  Constitutional Exam  General appearance: Well nourished, well developed, and well hydrated. In no apparent acute distress Vitals:   02/02/24 1010  BP: (!) 127/96  Pulse: 66  Resp: 14  Temp: (!) 97.3 F (36.3 C)  TempSrc: Temporal  SpO2: 100%  Weight: (!) 307 lb (139.3 kg)  Height: 5' 9 (1.753 m)   BMI Assessment: Estimated body  mass index is 45.34 kg/m as calculated from the following:   Height as of this encounter: 5' 9 (1.753 m).   Weight as of this encounter: 307 lb (139.3 kg).  BMI interpretation table: BMI level Category Range association with higher incidence of chronic pain  <18 kg/m2 Underweight   18.5-24.9 kg/m2 Ideal body weight   25-29.9 kg/m2 Overweight Increased incidence by 20%  30-34.9 kg/m2 Obese (Class I) Increased incidence by 68%  35-39.9 kg/m2 Severe obesity (Class II) Increased incidence by 136%  >40 kg/m2 Extreme obesity (Class III) Increased incidence by 254%   Patient's current BMI Ideal Body weight  Body mass index is 45.34 kg/m. Ideal body weight: 66.2 kg (145 lb 15.1 oz) Adjusted ideal body weight: 95.4 kg (210 lb 5.9 oz)   BMI Readings from Last 4 Encounters:  02/02/24 45.34 kg/m  12/31/23 45.78 kg/m  09/30/23 46.07 kg/m  09/10/23 45.85 kg/m   Wt Readings from Last 4 Encounters:  02/02/24 (!) 307 lb (139.3 kg)  12/31/23 (!) 310 lb (140.6 kg)  09/30/23 (!) 312 lb (141.5 kg)  09/10/23 (!) 310 lb 8 oz (140.8 kg)    Psych/Mental status: Alert, oriented x 3 (person, place, & time)       Eyes: PERLA Respiratory: No evidence of acute respiratory distress Physical Exam   Thoracolumbar spine: Positive tenderness over lumbar spine fractures.       Assessment & Plan  Primary Diagnosis & Pertinent  Problem List: The primary encounter diagnosis was Chronic thoracic back pain (Right). Diagnoses of Closed wedge compression fracture of L2 vertebra, initial encounter (HCC), Closed compression fracture of L3 vertebra, initial encounter (HCC), History of cancer of right breast, Chronic intermittent breast pain s/p implant (Right), and Chronic flank pain (Right) were also pertinent to this visit. Visit Diagnosis: 1. Chronic thoracic back pain (Right)   2. Closed wedge compression fracture of L2 vertebra, initial encounter (HCC)   3. Closed compression fracture of L3 vertebra, initial encounter (HCC)   4. History of cancer of right breast   5. Chronic intermittent breast pain s/p implant (Right)   6. Chronic flank pain (Right)    Problems updated and reviewed during this visit: Problem  Closed Wedge Compression Fracture of L2 Vertebra (Hcc)  Closed Compression Fracture of L3 Vertebra (Hcc)  History of Cancer of Right Breast  Disorder of Skin Or Subcutaneous Tissue    Plan of Care  Assessment and Plan    Wedge compression fractures of L2 and L3 vertebrae   New wedge compression fractures of L2 and L3 vertebrae may result from prior trauma or metastasis from breast cancer. Osteopenia or osteoporosis could also contribute to vertebral collapse. Order an MRI of the lumbar spine to evaluate these fractures, assess her acuity, and check for potential metastasis. Consider kyphoplasty if the MRI shows active fractures with swelling, and discuss a potential biopsy during the procedure to rule out metastasis.  Malignant neoplasm of breast   Breast cancer raises concern for possible metastasis to the lumbar spine due to new compression fractures. An MRI of the lumbar spine is necessary to rule out metastasis. Re-engage with the oncologist if the MRI suggests metastasis.  Osteopenia   Decreased bone density increases the risk of fractures, including vertebral compression fractures. Discuss with the  primary care physician about optimizing vitamin D , calcium , and magnesium  intake. Consider additional therapies to strengthen bones.  Osteoarthritis of cervical, thoracic spine, hips, and left knee   Multilevel moderate degenerative changes in the cervical  and thoracic spine, mild changes in the hips, and moderate to severe tricompartmental osteoarthritis in the left knee likely contribute to joint pain. Advise weight management to reduce joint stress. Consider steroid injections for hip and knee pain, and gel injections for the knee if needed.      Pharmacotherapy (Medications Ordered): No orders of the defined types were placed in this encounter.  Procedure Orders    No procedure(s) ordered today   Orders Placed This Encounter  Procedures   MR LUMBAR SPINE WO CONTRAST    Patient presents with axial pain with possible radicular component. Please assist us  in identifying specific level(s) and laterality of any additional findings such as: 1. Facet (Zygapophyseal) joint DJD (Hypertrophy, space narrowing, subchondral sclerosis, and/or osteophyte formation) 2. DDD and/or IVDD (Loss of disc height, desiccation, gas patterns, osteophytes, endplate sclerosis, or Black disc disease) 3. Pars defects 4. Spondylolisthesis, spondylosis, and/or spondyloarthropathies (include Degree/Grade of displacement in mm) (stability) 5. Vertebral body Fractures (acute/chronic) (state percentage of collapse) 6. Demineralization (osteopenia/osteoporotic) 7. Bone pathology 8. Foraminal narrowing  9. Surgical changes 10. Central, Lateral Recess, and/or Foraminal Stenosis (include AP diameter of stenosis in mm) 11. Surgical changes (hardware type, status, and presence of fibrosis) 12. Modic Type Changes (MRI only) 13. IVDD (Disc bulge, protrusion, herniation, extrusion) (Level, laterality, extent)    Standing Status:   Future    Expiration Date:   05/04/2024    Scheduling Instructions:     Please make sure that  the patient understands that this needs to be done as soon as possible. Never have the patient do the imaging just before the next appointment. Inform patient that having the imaging done within the [facility] will expedite the availability of the results and will provide      imaging availability to the requesting physician. In addition inform the patient that the imaging order has an expiration date and will not be renewed if not done within the active period.    What is the patient's sedation requirement?:   No Sedation    Does the patient have a pacemaker or implanted devices?:   No    Preferred imaging location?:   [facility] (table limit-350lbs)    Call Results- Best Contact Number?:   [phone] [facility]    Radiology Contrast Protocol - do NOT remove file path:   [email or web address]   Lab Orders  No laboratory test(s) ordered today   Imaging Orders         MR LUMBAR SPINE WO CONTRAST     Referral Orders  No referral(s) requested today    Pharmacological management:  Opioid Analgesics: I will not be prescribing any opioids at this time Membrane stabilizer: I will not be prescribing any at this time Muscle relaxant: I will not be prescribing any at this time NSAID: I will not be prescribing any at this time Other analgesic(s): I will not be prescribing any at this time      Interventional Therapies  Risk Factors  Considerations  Medical Comorbidities:  Hx. Breast Cancer (B) Mastectomy, Chemotherapy, XRT  OSA on CPAP  MO (BMI>40)  HTN  GERD     Planned  Pending:   Lumbar MRI to evaluate new L2 and L3 which compression fractures in patient with history of breast cancer    Under consideration:   Diagnostic/therapeutic midline L2-3 LESI #1    Completed: (Analgesic benefit)1  None at this time   Therapeutic  Palliative (  PRN) options:   None established   Completed by  other providers:   None reported  1(Analgesic benefit): Expressed in percentage (%). (Local anesthetic[LA] +/- sedation  L.A.Local Anesthetic  Steroid benefit  Ongoing benefit)      Provider-requested follow-up: Return for after MRI, (Face2F), to review of ordered test(s). Recent Visits Date Type Provider Dept  12/31/23 Office Visit Tanya Glisson, MD [facility]  Showing recent visits within past 90 days and meeting all other requirements Today's Visits Date Type Provider Dept  02/02/24 Office Visit Tanya Glisson, MD [facility]  Showing today's visits and meeting all other requirements Future Appointments No visits were found meeting these conditions. Showing future appointments within next 90 days and meeting all other requirements   Primary Care Physician: Michelle Maxie LABOR, MD  Duration of encounter: 38 minutes.  Total time on encounter, as per AMA guidelines included both the face-to-face and non-face-to-face time personally spent by the physician and/or other qualified health care professional(s) on the day of the encounter (includes time in activities that require the physician or other qualified health care professional and does not include time in activities normally performed by clinical staff). Physician's time may include the following activities when performed: Preparing to see the patient (e.g., pre-charting review of records, searching for previously ordered imaging, lab work, and nerve conduction tests) Review of prior analgesic pharmacotherapies. Reviewing PMP Interpreting ordered tests (e.g., lab work, imaging, nerve conduction tests) Performing post-procedure evaluations, including interpretation of diagnostic procedures Obtaining and/or reviewing separately obtained history Performing a medically appropriate examination and/or evaluation Counseling and educating the patient/family/caregiver Ordering medications, tests, or  procedures Referring and communicating with other health care professionals (when not separately reported) Documenting clinical information in the electronic or other health record Independently interpreting results (not separately reported) and communicating results to the patient/ family/caregiver Care coordination (not separately reported)  Note by: Glisson Mcmillan Tanya, MD (TTS technology used. I apologize for any typographical errors that were not detected and corrected.) Date: 02/02/2024; Time: 10:43 AM

## 2024-02-02 NOTE — Progress Notes (Signed)
 Safety precautions to be maintained throughout the outpatient stay will include: orient to surroundings, keep bed in low position, maintain call bell within reach at all times, provide assistance with transfer out of bed and ambulation.

## 2024-02-05 ENCOUNTER — Ambulatory Visit
Admission: RE | Admit: 2024-02-05 | Discharge: 2024-02-05 | Disposition: A | Discharge status: Home / Self Care | Source: Ambulatory Visit | Attending: Pain Medicine | Admitting: Pain Medicine

## 2024-02-05 DIAGNOSIS — S32030A Wedge compression fracture of third lumbar vertebra, initial encounter for closed fracture: Secondary | ICD-10-CM | POA: Diagnosis present

## 2024-02-05 DIAGNOSIS — R10A1 Flank pain, right side: Secondary | ICD-10-CM | POA: Insufficient documentation

## 2024-02-05 DIAGNOSIS — G8929 Other chronic pain: Secondary | ICD-10-CM | POA: Insufficient documentation

## 2024-02-05 DIAGNOSIS — M546 Pain in thoracic spine: Secondary | ICD-10-CM | POA: Diagnosis present

## 2024-02-05 DIAGNOSIS — S32020A Wedge compression fracture of second lumbar vertebra, initial encounter for closed fracture: Secondary | ICD-10-CM | POA: Diagnosis present

## 2024-02-05 DIAGNOSIS — T85848A Pain due to other internal prosthetic devices, implants and grafts, initial encounter: Secondary | ICD-10-CM | POA: Insufficient documentation

## 2024-02-05 DIAGNOSIS — S32030S Wedge compression fracture of third lumbar vertebra, sequela: Secondary | ICD-10-CM | POA: Insufficient documentation

## 2024-02-05 DIAGNOSIS — S32009D Unspecified fracture of unspecified lumbar vertebra, subsequent encounter for fracture with routine healing: Secondary | ICD-10-CM | POA: Insufficient documentation

## 2024-02-05 DIAGNOSIS — M160 Bilateral primary osteoarthritis of hip: Secondary | ICD-10-CM | POA: Insufficient documentation

## 2024-02-05 DIAGNOSIS — Z853 Personal history of malignant neoplasm of breast: Secondary | ICD-10-CM | POA: Insufficient documentation

## 2024-02-10 DIAGNOSIS — R937 Abnormal findings on diagnostic imaging of other parts of musculoskeletal system: Secondary | ICD-10-CM | POA: Insufficient documentation

## 2024-02-10 NOTE — Progress Notes (Unsigned)
 PROVIDER NOTE: Interpretation of information contained herein should be left to medically-trained personnel. Specific patient instructions are provided elsewhere under Patient Instructions section of medical record. This document was created in part using AI and STT-dictation technology, any transcriptional errors that may result from this process are unintentional.  Patient: Michelle Mcmillan  Service: E/M   PCP: Lorel Maxie LABOR, MD  DOB: 02-04-1971  DOS: 02/11/2024  Provider: Eric LABOR Como, MD  MRN: [id number]  Delivery: Face-to-face  Specialty: Interventional Pain Management  Type: Established Patient  Setting: Ambulatory outpatient facility  Specialty designation: 09  Referring Prov.: Lorel Maxie LABOR, MD  Location: Outpatient office facility       History of present illness (HPI) [name], a 53 y.o. year old female, is here today because of her Chronic right-sided low back pain without sciatica [M54.50, G89.29]. [name] primary complain today is Back Pain (Mid right back)  Pertinent problems: [name] has Malignant neoplasm of upper-outer quadrant of right female breast (HCC); Breast cancer (HCC); History of mastectomy (Bilateral); S/P breast reconstruction (Bilateral); Ulnar neuropathy; Limited joint range of motion (ROM); Chronic knee pain (Left); Migraine without status migrainosus, not intractable; Breast pain in female; Chronic neck pain (Bilateral); Chronic flank pain (Right); Malignant neoplasm of unspecified site of right female breast (HCC); Malignant neoplasm of right breast in female, estrogen receptor positive (HCC); Cluster headache; Migraine headache; Osteoarthritis of knee (Left); Leg pain (Left); Bursitis of hip (Left); Chronic pain syndrome; Chronic mid back pain; Chronic thoracic back pain (Right); Chronic hip pain (Left); Cervicalgia; Cervicogenic headache; Occipital headache (Greater) (Bilateral); Chronic intermittent breast pain s/p implant (Right); History of  reconstruction of breasts (Bilateral); Silicone breast implant in situ (Bilateral); Compression fracture of L2 lumbar vertebra, sequela; History of cancer of breast (Right); Osteoarthritis of hip (Bilateral); Compression fracture of L3 lumbar vertebra, sequela; Abnormal MRI, lumbar spine (02/06/2024); Chronic low back pain (1ry area of Pain) (Right) w/o sciatica; Low back pain of over 3 months duration; Multifactorial low back pain; Vertebrogenic low back pain; Lumbar facet arthropathy (Multilevel) (Bilateral); Lumbar facet joint pain; Tricompartment osteoarthritis of knee (Left); DDD (degenerative disc disease), cervical; DDD (degenerative disc disease), lumbar; Osteoarthritis involving multiple joints; Lumbar facet joint syndrome; and Spondylosis without myelopathy or radiculopathy, lumbar region on their pertinent problem list.  Pain Assessment: Severity of Chronic pain is reported as a 5 /10. Location: Back Right, Mid/denies. Onset: More than a month ago. Quality: Aching, Throbbing. Timing: Constant. Modifying factor(s): meds,. Vitals:  height is 5' 9 (1.753 m) and weight is 311 lb (141.1 kg) (abnormal). Her temperature is 97.7 F (36.5 C). Her blood pressure is 132/76 and her pulse is 58 (abnormal). Her oxygen saturation is 100%.  BMI: Estimated body mass index is 45.93 kg/m as calculated from the following:   Height as of this encounter: 5' 9 (1.753 m).   Weight as of this encounter: 311 lb (141.1 kg).  Last encounter: 02/02/2024. Last procedure: Visit date not found.  Reason for encounter: follow-up evaluation s/p MRI.   (02/06/2024) LUMBAR SPINA MRI FINDINGS: Mild degenerative disc desiccation facet arthrosis. There is likely benign hemangioma in the T12 vertebral body.  DISC LEVELS: L2-3: Mild broad-based disc osteophyte and mild facet arthrosis.  L3-4: Mild broad-based disc osteophyte mild-to-moderate facet arthrosis. Slight caudal foraminal narrowing is present bilaterally. L4-5:  Broad-based disc osteophyte effacing the ventral thecal sac. There is moderate to severe facet arthrosis bilaterally. Mild facet edema seen to the left of midline. There is slight caudal foraminal  narrowing without impingement of the exiting nerves or descending nerve roots. L5-S1: Mild disc desiccation and moderate facet arthrosis, left greater the right (L>R). There is mild left foraminal narrowing without impingement of the exiting nerve.  IMPRESSION: Multilevel disc desiccation and facet arthrosis. There is no high-grade spinal or foraminal stenosis. See above for more detail at each individual level. There is moderate to severe facet arthrosis at L4-5 with reactive edema to the left of midline. Correlation for left facet symptoms. No acute abnormality.  Review of initial evaluation (12/31/2023): Michelle Mcmillan is a 53 year old female with a history of breast cancer and mastectomy who presents with chronic right-sided back pain.   She experiences chronic right-sided back pain in the mid-back area for over six months, described as a pulling sensation, especially when coughing or moving. This pain began after a mastectomy with latissimus muscle removal for reconstructive surgery on the right side. She attends physical therapy twice weekly, which provides variable relief but sometimes exacerbates the pain. No nerve blocks have been administered for this pain. An MRI of the breast was performed in September 2025, but no spinal imaging has been conducted.   Her current medications include gabapentin  300 mg, taken two in the morning and two at night, causing drowsiness. She has been on this medication since 2019, with an increased dosage two years ago. Additionally, she takes ibuprofen  800 mg approximately three times a week and Flexeril as needed for pain relief.    Review of diagnostic test ordered on 12/31/2023:  Diagnostic lab work: Comprehensive metabolic panel revealed a mildly elevated blood  glucose level of 117 (normal 70 to 99 mg/dL).  However, the patient had not been asked to fast for this test.  It also revealed a very mildly elevated sodium level of 145 mmol/L (normal 134-144) and a mildly decreased total protein of 5.9 g/dL (normal 3.9-1.4).  In addition it also revealed the patient to have an elevated sed rate of 44 mm/h (normal 0-40).  Vitamin D  levels, C-reactive protein, magnesium  levels, and vitamin B12 levels were all within normal limits. Diagnostic imaging: Diagnostic x-rays of the cervical spine with flexion and extension views did not show any acute pathology.  No evidence of fracture or prevertebral soft tissue swelling.  Normal alignment.  However it did show multilevel moderate degenerative changes of the cervical spine.  Diagnostic x-rays of the left hip show no acute fracture, diastases, or dislocation.  It did show mild degenerative changes of both hips.  Diagnostic x-rays of the left knee show no acute fracture or dislocation.  Moderate to severe tricompartmental osteoarthritis of the left knee, unchanged.  Diagnostic x-rays of the lumbar spine with bending views demonstrated interval development of mild anterior compression wedge deformity of the L2 and L3 levels.  Diagnostic x-rays of the thoracic spine showed no acute displaced fracture or traumatic listhesis of the thoracic spine.  Limited evaluation due to overlapping osseous structures and overlying soft tissue.   Discussed the use of AI scribe software for clinical note transcription with the patient, who gave verbal consent to proceed.   History of Present Illness   Michelle Mcmillan is a 53 year old female with a history of breast cancer who presents with right-sided back pain.   She experiences persistent right-sided mid-back pain following a mastectomy with latissimus muscle removal for reconstructive surgery. The pain has been ongoing since the procedure.   X-rays from December 31, 2023, revealed multilevel  moderate degenerative changes in the cervical  spine, mild degenerative changes in both hips, and moderate to severe tricompartmental osteoarthritis in the left knee. Lumbar spine x-rays showed mild anterior compression wedge deformities at L2 and L3. Thoracic spine x-rays showed no acute displaced fractures or traumatic cystesis.   Discussed the use of AI scribe software for clinical note transcription with the patient, who gave verbal consent to proceed.  History of Present Illness   Michelle Mcmillan is a 53 year old female with chronic low back pain who presents for follow-up after a lumbar spine MRI.  She experiences chronic right-sided low back pain with occasional left-sided pain. Pain radiates down the left leg, primarily through the hip to the side of the leg, sometimes extending to the mid-thigh.  The lumbar spine MRI from 02/06/24 shows degenerative disc disease, desiccation, and facet arthrosis at several levels, with severe facet arthrosis at L4-5 and reactive edema to the left of midline. A prior x-ray revealed a mild anterior compression deformity of L2 and L3 and a right-sided L5-S1 pseudoarthrosis.  She is preparing for gastric bypass surgery as part of her weight management efforts. She uses blood pressure medication and does not use blood thinners.       Pharmacotherapy Assessment   Opioid Analgesic: None MME/day: 0 mg/day   Monitoring: [place] PMP: PDMP reviewed during this encounter.       Pharmacotherapy: No side-effects or adverse reactions reported. Compliance: No problems identified. Effectiveness: Clinically acceptable.  Michelle Nathanel PARAS, Michelle Mcmillan  02/11/2024  9:30 AM  Sign when Signing Visit Safety precautions to be maintained throughout the outpatient stay will include: orient to surroundings, keep bed in low position, maintain call bell within reach at all times, provide assistance with transfer out of bed and ambulation.     UDS:  Summary  Date Value Ref Range Status   12/31/2023 FINAL  Final    Comment:    ==================================================================== Compliance Drug Analysis, Ur ==================================================================== Test                             Result       Flag       Units  Drug Absent but Declared for Prescription Verification   Gabapentin                      Not Detected UNEXPECTED   Topiramate                      Not Detected UNEXPECTED   Cyclobenzaprine                Not Detected UNEXPECTED   Bupropion                       Not Detected UNEXPECTED   Diclofenac                      Not Detected UNEXPECTED    Topical diclofenac , as indicated in the declared medication list, is    not always detected even when used as directed.    Ibuprofen                       Not Detected UNEXPECTED    Ibuprofen , as indicated in the declared medication list, is not    always detected even when used as directed.  ==================================================================== Test  Result    Flag   Units      Ref Range   Creatinine              168              mg/dL      >=79 ==================================================================== Declared Medications:  The flagging and interpretation on this report are based on the  following declared medications.  Unexpected results may arise from  inaccuracies in the declared medications.   **Note: The testing scope of this panel includes these medications:   Bupropion  (Wellbutrin )  Cyclobenzaprine (Flexeril)  Gabapentin   Topiramate  (Topamax )   **Note: The testing scope of this panel does not include small to  moderate amounts of these reported medications:   Ibuprofen  (Advil )  Topical Diclofenac  (Voltaren )   **Note: The testing scope of this panel does not include the  following reported medications:   Eye Drops  Fluticasone (Flonase)  Hydrochlorothiazide  (Microzide )  Iron   Magnesium   Melatonin  Omeprazole   (Prilosec)  Rizatriptan (Maxalt)  Supplement  Vitamin D3 ==================================================================== For clinical consultation, please call [phone]. ====================================================================     No results found for: CBDTHCR No results found for: D8THCCBX No results found for: D9THCCBX  ROS  Constitutional: Denies any fever or chills Gastrointestinal: No reported hemesis, hematochezia, vomiting, or acute GI distress Musculoskeletal: Denies any acute onset joint swelling, redness, loss of ROM, or weakness Neurological: No reported episodes of acute onset apraxia, aphasia, dysarthria, agnosia, amnesia, paralysis, loss of coordination, or loss of consciousness  Medication Review  Apple Cider Vinegar, Elderberry, Iron , Magnesium , Melatonin, Vitamin D3, buPROPion , cyclobenzaprine, diclofenac  sodium, dorzolamide-timolol, fluticasone, gabapentin , hydrOXYzine, hydrochlorothiazide , ibuprofen , latanoprost, magnesium  oxide, omeprazole , rizatriptan, and topiramate   History Review  Allergy: Ms. Moultry is allergic to nitroglycerin. Drug: Ms. Mchaney  reports no history of drug use. Alcohol:  reports that she does not currently use alcohol. Tobacco:  reports that she has never smoked. She has never used smokeless tobacco. Social: Ms. Monds  reports that she has never smoked. She has never used smokeless tobacco. She reports that she does not currently use alcohol. She reports that she does not use drugs. Medical:  has a past medical history of Anemia, Anemia, Arthritis, Back pain, Breast cancer (HCC) (2017), Bursitis, Cancer (HCC), Carpal tunnel syndrome, Constipation, Depression, Dyspnea, GERD (gastroesophageal reflux disease), Headache, Hypertension, Intractable nausea and vomiting (08/11/2017), Joint pain, Last menstrual period (LMP) > 10 days ago (08/2015), Lower extremity edema, Migraines, Neuropathy, OSA (obstructive sleep apnea),  Personal history of radiation therapy, Prediabetes, and SOB (shortness of breath). Surgical: Ms. Swamy  has a past surgical history that includes Cesarean section (1988); Tubal ligation (2004); Mass excision (N/A, 05/20/2016); Breast lumpectomy with needle localization (Right, 06/15/2015); Sentinel node biopsy (Right, 06/15/2015); Breast surgery; Colonoscopy with propofol  (N/A, 01/23/2017); Esophagogastroduodenoscopy (egd) with propofol  (N/A, 01/23/2017); Breast excisional biopsy (Right, 06/15/15); Breast biopsy (Right, 05/08/2017); Total mastectomy (Right, 05/22/2017); Total mastectomy (Left, 02/09/2018); Breast reconstruction with placement of tissue expander and flex hd (acellular hydrated dermis) (Left, 02/09/2018); Reconstruction breast w/ latissimus dorsi flap (Right, 06/01/2018); Latissimus flap to breast (Right, 06/01/2018); Removal of bilateral tissue expanders with placement of bilateral breast implants (Bilateral, 10/15/2018); Liposuction with lipofilling (Bilateral, 10/15/2018); Ulnar nerve transposition (Left, 01/29/2019); Scar revision (Bilateral, 04/22/2019); and Liposuction with lipofilling (Bilateral, 04/22/2019). Family: family history includes Breast cancer in her paternal aunt and paternal aunt; Breast cancer (age of onset: 65) in her cousin; Diabetes in her father; Hypertension in her father, mother, and sister; Obesity in her mother.  Laboratory Chemistry Profile   Renal Lab Results  Component Value Date   BUN 12 12/31/2023   CREATININE 0.97 12/31/2023   BCR 12 12/31/2023   GFRAA >60 01/25/2019   GFRNONAA >60 11/19/2021    Hepatic Lab Results  Component Value Date   AST 21 12/31/2023   ALT 15 06/24/2022   ALBUMIN 3.8 12/31/2023   ALKPHOS 112 12/31/2023    Electrolytes Lab Results  Component Value Date   NA 145 (H) 12/31/2023   K 4.2 12/31/2023   CL 105 12/31/2023   CALCIUM  9.0 12/31/2023   MG 1.9 12/31/2023    Bone Lab Results  Component Value Date   VD25OH 39.5  09/25/2022   25OHVITD1 40 12/31/2023   25OHVITD2 <1.0 12/31/2023   25OHVITD3 40 12/31/2023    Inflammation (CRP: Acute Phase) (ESR: Chronic Phase) Lab Results  Component Value Date   CRP 2 12/31/2023   ESRSEDRATE 44 (H) 12/31/2023         Note: Above Lab results reviewed.  Recent Imaging Review  MR LUMBAR SPINE WO CONTRAST MR LUMBAR SPINE WITHOUT IV CONTRAST  COMPARISON: None available  CLINICAL HISTORY: Back pain.  TECHNIQUE: SAG T2, SAG T1, SAG STIR, AX T2, AX T1 without IV contrast.  FINDINGS: There is normal alignment of the lumbar spine. There is mild degenerative disc desiccation facet arthrosis. There is likely benign hemangioma in the T12 vertebral body. There is no vertebral body height loss, subluxation or marrow replacing process. The sacrum and SI joints are unremarkable so far as visualized. Conus and cauda equina are unremarkable.  T12-L1: There is no focal disc protrusion, foraminal or spinal stenosis.  L1-2: There is no focal disc protrusion, foraminal or spinal stenosis.  L2-3: Mild broad-based disc osteophyte and mild facet arthrosis. No significant foraminal or spinal stenosis.  L3-4: Mild broad-based disc osteophyte mild-to-moderate facet arthrosis. No significant foraminal or spinal stenosis. Slight caudal foraminal narrowing is present bilaterally.  L4-5: Broad-based disc osteophyte effacing the ventral thecal sac. There is moderate to severe facet arthrosis bilaterally. Mild facet edema seen to the left of midline. There is slight caudal foraminal narrowing without impingement of the exiting nerves or descending nerve roots.  L5-S1: Mild disc desiccation and moderate facet arthrosis, left greater the right. There is mild left foraminal narrowing without impingement of the exiting nerve.  The retroperitoneal structures demonstrate no significant abnormality.  IMPRESSION: Multilevel disc desiccation and facet arthrosis. There is no  high-grade spinal or foraminal stenosis. See above for more detail at each individual level.  There is moderate to severe facet arthrosis at L4-5 with reactive edema to the left of midline. Correlation for left facet symptoms.  No acute abnormality.  Electronically signed by: Norleen Satchel MD 02/06/2024 05:26 PM EST RP Workstation: [id number] Note: Reviewed        Physical Exam  Vitals: BP 132/76   Pulse (!) 58   Temp 97.7 F (36.5 C)   Ht 5' 9 (1.753 m)   Wt (!) 311 lb (141.1 kg)   LMP 07/30/2017   SpO2 100%   BMI 45.93 kg/m  BMI: Estimated body mass index is 45.93 kg/m as calculated from the following:   Height as of this encounter: 5' 9 (1.753 m).   Weight as of this encounter: 311 lb (141.1 kg). Ideal: Ideal body weight: 66.2 kg (145 lb 15.1 oz) Adjusted ideal body weight: 96.1 kg (211 lb 15.5 oz) General appearance: Well nourished, well developed, and well hydrated. In no apparent acute  distress Mental status: Alert, oriented x 3 (person, place, & time)       Respiratory: No evidence of acute respiratory distress Eyes: PERLA Physical Exam   MUSCULOSKELETAL: Positive Kemp maneuver indicating pain on the right side of the facet joints.       Assessment   Diagnosis Status  1. Chronic low back pain (1ry area of Pain) (Right) w/o sciatica   2. Lumbar facet arthropathy (Multilevel) (Bilateral)   3. Lumbar facet joint pain   4. Lumbar facet joint syndrome   5. Low back pain of over 3 months duration   6. Spondylosis without myelopathy or radiculopathy, lumbar region   7. Multifactorial low back pain   8. Osteoarthritis involving multiple joints   9. Tricompartment osteoarthritis of knee (Left)   10. DDD (degenerative disc disease), cervical   11. Degeneration of intervertebral disc of lumbar region, unspecified whether pain present   12. Abnormal MRI, lumbar spine (02/06/2024)   13. Problems influencing health status   14. BMI 45.0-49.9, adult (HCC)-46.4     Persistent Persistent Persistent   Updated Problems: Problem  Chronic low back pain (1ry area of Pain) (Right) w/o sciatica  Low Back Pain of Over 3 Months Duration  Multifactorial Low Back Pain  Vertebrogenic Low Back Pain  Lumbar facet arthropathy (Multilevel) (Bilateral)   (02/06/2024) LUMBAR SPINA MRI FINDINGS: Mild degenerative disc desiccation facet arthrosis.  LEVELS: L2-3: Mild facet arthrosis.  L3-4: Mild-to-moderate facet arthrosis. L4-5: Moderate to severe facet arthrosis bilaterally. Mild facet edema seen to the left of midline. L5-S1: Moderate facet arthrosis, left greater the right (L>R).    Lumbar Facet Joint Pain  Tricompartment osteoarthritis of knee (Left)  Ddd (Degenerative Disc Disease), Cervical  Ddd (Degenerative Disc Disease), Lumbar  Osteoarthritis involving multiple joints  Lumbar Facet Joint Syndrome  Spondylosis Without Myelopathy Or Radiculopathy, Lumbar Region  Abnormal MRI, lumbar spine (02/06/2024)   (02/06/2024) LUMBAR SPINA MRI FINDINGS: Mild degenerative disc desiccation facet arthrosis. There is likely benign hemangioma in the T12 vertebral body.  DISC LEVELS: L2-3: Mild broad-based disc osteophyte and mild facet arthrosis.  L3-4: Mild broad-based disc osteophyte mild-to-moderate facet arthrosis. Slight caudal foraminal narrowing is present bilaterally. L4-5: Broad-based disc osteophyte effacing the ventral thecal sac. There is moderate to severe facet arthrosis bilaterally. Mild facet edema seen to the left of midline. There is slight caudal foraminal narrowing without impingement of the exiting nerves or descending nerve roots. L5-S1: Mild disc desiccation and moderate facet arthrosis, left greater the right (L>R). There is mild left foraminal narrowing without impingement of the exiting nerve.  IMPRESSION: Multilevel disc desiccation and facet arthrosis. There is no high-grade spinal or foraminal stenosis. See above for more detail at each  individual level. There is moderate to severe facet arthrosis at L4-5 with reactive edema to the left of midline. Correlation for left facet symptoms. No acute abnormality.   Osteoarthritis of hip (Bilateral)  Compression Fracture of L3 Lumbar Vertebra, Sequela  Compression Fracture of L2 Lumbar Vertebra, Sequela  History of cancer of breast (Right)  Leg pain (Left)  S/P breast reconstruction (Bilateral)  Obesity, Class III, Bmi 40-49.9 (Morbid Obesity) (Hcc)  Class 3 Severe Obesity Due to Excess Calories With Body Mass Index (Bmi) of 45.0 to 49.9 in Adult (Hcc)  Osa On Cpap   HST 11/26/20 >> AHI 28.1, SpO2 low 72%.    Obstructive Sleep Apnea  Gerd (Gastroesophageal Reflux Disease)  Severe Obesity (Bmi >= 40) (Hcc)     Plan of Care  Problem-specific:  Assessment and Plan    Chronic right-sided low back pain due to lumbar facet arthrosis with edema and multilevel degenerative disc disease   Chronic right-sided low back pain is linked to lumbar facet arthrosis and multilevel degenerative disc disease. MRI reveals severe facet arthrosis at L4-5 with reactive edema left of midline. A positive Kemp maneuver confirms pain from facet joints, with radiation down the left leg. Scheduled diagnostic lumbar facet blocks with numbing medicine and steroid aim to reduce edema. She received written instructions on procedure preparation, including the need for a driver and sedation. She should monitor pain relief and changes post-procedure.  History of mild anterior compression deformity of L2 and L3 vertebrae   Mild anterior compression deformity of L2 and L3 vertebrae is noted on prior x-ray. MRI does not show fracture, but further evaluation is pending.  Morbid obesity (BMI 45-49.9)   Morbid obesity contributes to lumbar spine issues. She is undergoing gastric bypass surgery, which is crucial for weight management to alleviate spinal pressure and reduce health risks like hypertension, diabetes, and  certain cancers. She should avoid steroids two weeks before and after surgery to prevent infection and slow healing. Continue with the gastric bypass surgery process.       Ms. Aileena Iglesia has a current medication list which includes the following long-term medication(s): bupropion , iron , fluticasone, gabapentin , hydrochlorothiazide , omeprazole , rizatriptan, and topiramate .  Pharmacotherapy (Medications Ordered): No orders of the defined types were placed in this encounter.  Orders:  Orders Placed This Encounter  Procedures   LUMBAR FACET(MEDIAL BRANCH NERVE BLOCK) MBNB    Diagnosis: Lumbar Facet Syndrome (M47.816); Lumbosacral Facet Syndrome (M47.817); Lumbar Facet Joint Pain (M54.59) Medical Necessity Statement: 1.Severe chronic axial low back pain causing functional impairment documented by ongoing pain scale assessments. 2.Pain present for longer than 3 months (Chronic) documented to have failed noninvasive conservative therapies. 3.Absence of untreated radiculopathy. 4.There is no radiological evidence of untreated fractures, tumor, infection, or deformity.  Physical Examination Findings: Positive Kemp Maneuver: (Y)  Positive Lumbar Hyperextension-Rotation provocative test: (Y)    Standing Status:   Future    Expiration Date:   05/13/2024    Scheduling Instructions:     Procedure: Lumbar facet Block     Type: Medial Branch Block     Side: Bilateral     Purpose: Diagnostic Radiologic Mapping     Level(s): L3-4, L4-5, and L5-S1 Facets (L2, L3, L4, L5, and S1 Medial Branch)     Sedation: With Sedation.     Timeframe: ASAP    Where will this procedure be performed?:   [facility]     Interventional Therapies  Risk Factors  Considerations  Medical Comorbidities:  Hx. Breast Cancer (B) Mastectomy, Chemotherapy, XRT  OSA on CPAP  MO (BMI>40)  HTN  GERD     Planned  Pending:   Diagnostic/therapeutic bilateral lumbar facet MBB #1    Under consideration:    Diagnostic/therapeutic right L2-3 LESI #1  Diagnostic/therapeutic right vs bilateral lumbar facet MBB #1    Completed: (Analgesic benefit)1  Diagnostic lumbar spine MRI (02/06/2024)    Therapeutic  Palliative (PRN) options:   None established   Completed by other providers:   None reported  1(Analgesic benefit): Expressed in percentage (%). (Local anesthetic[LA] +/- sedation  L.A.Local Anesthetic  Steroid benefit  Ongoing benefit)     Return for (ECT):(B) L-FCT Blk #1.    Recent Visits Date Type Provider Dept  02/02/24 Office Visit Tanya Glisson, MD [facility]  12/31/23 Office Visit Tanya Glisson, MD [facility]  Showing recent visits within past 90 days and meeting all other requirements Today's Visits Date Type Provider Dept  02/11/24 Office Visit Tanya Glisson, MD [facility]  Showing today's visits and meeting all other requirements Future Appointments No visits were found meeting these conditions. Showing future appointments within next 90 days and meeting all other requirements  I discussed the assessment and treatment plan with the patient. The patient was provided an opportunity to ask questions and all were answered. The patient agreed with the plan and demonstrated an understanding of the instructions.  Patient advised to call back or seek an in-person evaluation if the symptoms or condition worsens.  Duration of encounter: 62 minutes.  Total time on encounter, as per AMA guidelines included both the face-to-face and non-face-to-face time personally spent by the physician and/or other qualified health care professional(s) on the day of the encounter (includes time in activities that require the physician or other qualified health care professional and does not include time in activities normally performed by clinical staff). Physician's time may include the following activities when performed: Preparing to see the  patient (e.g., pre-charting review of records, searching for previously ordered imaging, lab work, and nerve conduction tests) Review of prior analgesic pharmacotherapies. Reviewing PMP Interpreting ordered tests (e.g., lab work, imaging, nerve conduction tests) Performing post-procedure evaluations, including interpretation of diagnostic procedures Obtaining and/or reviewing separately obtained history Performing a medically appropriate examination and/or evaluation Counseling and educating the patient/family/caregiver Ordering medications, tests, or procedures Referring and communicating with other health care professionals (when not separately reported) Documenting clinical information in the electronic or other health record Independently interpreting results (not separately reported) and communicating results to the patient/ family/caregiver Care coordination (not separately reported)  Note by: Glisson DELENA Tanya, MD (TTS and AI technology used. I apologize for any typographical errors that were not detected and corrected.) Date: 02/11/2024; Time: 10:16 AM

## 2024-02-11 ENCOUNTER — Encounter: Payer: Self-pay | Admitting: Pain Medicine

## 2024-02-11 ENCOUNTER — Ambulatory Visit: Attending: Pain Medicine | Admitting: Pain Medicine

## 2024-02-11 VITALS — BP 132/76 | HR 58 | Temp 97.7°F | Ht 69.0 in | Wt 311.0 lb

## 2024-02-11 DIAGNOSIS — M5459 Other low back pain: Secondary | ICD-10-CM | POA: Insufficient documentation

## 2024-02-11 DIAGNOSIS — M47816 Spondylosis without myelopathy or radiculopathy, lumbar region: Secondary | ICD-10-CM | POA: Insufficient documentation

## 2024-02-11 DIAGNOSIS — Z6841 Body Mass Index (BMI) 40.0 and over, adult: Secondary | ICD-10-CM | POA: Insufficient documentation

## 2024-02-11 DIAGNOSIS — M1712 Unilateral primary osteoarthritis, left knee: Secondary | ICD-10-CM | POA: Diagnosis present

## 2024-02-11 DIAGNOSIS — Z789 Other specified health status: Secondary | ICD-10-CM | POA: Diagnosis present

## 2024-02-11 DIAGNOSIS — M5136 Other intervertebral disc degeneration, lumbar region with discogenic back pain only: Secondary | ICD-10-CM | POA: Diagnosis not present

## 2024-02-11 DIAGNOSIS — R937 Abnormal findings on diagnostic imaging of other parts of musculoskeletal system: Secondary | ICD-10-CM | POA: Insufficient documentation

## 2024-02-11 DIAGNOSIS — M545 Low back pain, unspecified: Secondary | ICD-10-CM | POA: Diagnosis present

## 2024-02-11 DIAGNOSIS — G8929 Other chronic pain: Secondary | ICD-10-CM | POA: Diagnosis present

## 2024-02-11 DIAGNOSIS — M150 Primary generalized (osteo)arthritis: Secondary | ICD-10-CM | POA: Insufficient documentation

## 2024-02-11 DIAGNOSIS — M51369 Other intervertebral disc degeneration, lumbar region without mention of lumbar back pain or lower extremity pain: Secondary | ICD-10-CM | POA: Insufficient documentation

## 2024-02-11 DIAGNOSIS — M5030 Other cervical disc degeneration, unspecified cervical region: Secondary | ICD-10-CM | POA: Insufficient documentation

## 2024-02-11 DIAGNOSIS — M5451 Vertebrogenic low back pain: Secondary | ICD-10-CM | POA: Insufficient documentation

## 2024-02-11 DIAGNOSIS — E66813 Obesity, class 3: Secondary | ICD-10-CM | POA: Insufficient documentation

## 2024-02-11 NOTE — Progress Notes (Signed)
 Safety precautions to be maintained throughout the outpatient stay will include: orient to surroundings, keep bed in low position, maintain call bell within reach at all times, provide assistance with transfer out of bed and ambulation.

## 2024-02-11 NOTE — Patient Instructions (Signed)
 ______________________________________________________________________    Procedure instructions  Stop blood-thinners  Do not eat or drink fluids (other than water) for 6 hours before your procedure  No water for 2 hours before your procedure  Take your blood pressure medicine with a sip of water  Arrive 30 minutes before your appointment  If sedation is planned, bring suitable driver. [place], [place], & public transportation are NOT APPROVED)  Carefully read the Preparing for your procedure detailed instructions  If you have questions call us  at [phone]  Procedure appointments are for procedures only.   NO medication refills or new problem evaluations will be done on procedure days.   Only the scheduled, pre-approved procedure and side will be done.   ______________________________________________________________________     ______________________________________________________________________    Preparing for your procedure  Appointments: If you think you may not be able to keep your appointment, call 24-48 hours in advance to cancel. We need time to make it available to others.  Procedure visits are for procedures only. During your procedure appointment there will be: NO Prescription Refills*. NO medication changes or discussions*. NO discussion of disability issues*. NO unrelated pain problem evaluations*. NO evaluations to order other pain procedures*. *These will be addressed at a separate and distinct evaluation encounter on the provider's evaluation schedule and not during procedure days.  Instructions: Food intake: Avoid eating anything solid for at least 8 hours prior to your procedure. Clear liquid intake: You may take clear liquids such as water up to 2 hours prior to your procedure. (No carbonated drinks. No soda.) Transportation: Unless otherwise stated by your physician, bring a driver. (Driver cannot be a Market Researcher, Pharmacist, Community, or any other form of public  transportation.) Morning Medicines: Except for blood thinners, take all of your other morning medications with a sip of water. Make sure to take your heart and blood pressure medicines. If your blood pressure's lower number is above 100, the case will be rescheduled. Blood thinners: Make sure to stop your blood thinners as instructed.  If you take a blood thinner, but were not instructed to stop it, call our office [phone] and ask to talk to a nurse. Not stopping a blood thinner prior to certain procedures could lead to serious complications. Diabetics on insulin : Notify the staff so that you can be scheduled 1st case in the morning. If your diabetes requires high dose insulin , take only  of your normal insulin  dose the morning of the procedure and notify the staff that you have done so. Preventing infections: Shower with an antibacterial soap the morning of your procedure.  Build-up your immune system: Take 1000 mg of Vitamin C with every meal (3 times a day) the day prior to your procedure. Antibiotics: Inform the nursing staff if you are taking any antibiotics or if you have any conditions that may require antibiotics prior to procedures. (Example: recent joint implants)   Pregnancy: If you are pregnant make sure to notify the nursing staff. Not doing so may result in injury to the fetus, including death.  Sickness: If you have a cold, fever, or any active infections, call and cancel or reschedule your procedure. Receiving steroids while having an infection may result in complications. Arrival: You must be in the facility at least 30 minutes prior to your scheduled procedure. Tardiness: Your scheduled time is also the cutoff time. If you do not arrive at least 15 minutes prior to your procedure, you will be rescheduled.  Children: Do not bring any children with  you. Make arrangements to keep them home. Dress appropriately: There is always a possibility that your clothing may get soiled. Avoid  long dresses. Valuables: Do not bring any jewelry or valuables.  Reasons to call and reschedule or cancel your procedure: (Following these recommendations will minimize the risk of a serious complication.) Surgeries: Avoid having procedures within 2 weeks of any surgery. (Avoid for 2 weeks before or after any surgery). Flu Shots: Avoid having procedures within 2 weeks of a flu shots or . (Avoid for 2 weeks before or after immunizations). Barium: Avoid having a procedure within 7-10 days after having had a radiological study involving the use of radiological contrast. (Myelograms, Barium swallow or enema study). Heart attacks: Avoid any elective procedures or surgeries for the initial 6 months after a Myocardial Infarction (Heart Attack). Blood thinners: It is imperative that you stop these medications before procedures. Let us  know if you if you take any blood thinner.  Infection: Avoid procedures during or within two weeks of an infection (including chest colds or gastrointestinal problems). Symptoms associated with infections include: Localized redness, fever, chills, night sweats or profuse sweating, burning sensation when voiding, cough, congestion, stuffiness, runny nose, sore throat, diarrhea, nausea, vomiting, cold or Flu symptoms, recent or current infections. It is specially important if the infection is over the area that we intend to treat. Heart and lung problems: Symptoms that may suggest an active cardiopulmonary problem include: cough, chest pain, breathing difficulties or shortness of breath, dizziness, ankle swelling, uncontrolled high or unusually low blood pressure, and/or palpitations. If you are experiencing any of these symptoms, cancel your procedure and contact your primary care physician for an evaluation.  Remember:  Regular Business hours are:  Monday to Thursday 8:00 AM to 4:00 PM  Provider's Schedule: Eric Como, MD:  Procedure days: Tuesday and Thursday 7:30  AM to 4:00 PM  Wallie Sherry, MD:  Procedure days: Monday and Wednesday 7:30 AM to 4:00 PM Last  Updated: 03/11/2023 ______________________________________________________________________     ______________________________________________________________________    General Risks and Possible Complications  Patient Responsibilities: It is important that you read this as it is part of your informed consent. It is our duty to inform you of the risks and possible complications associated with treatments offered to you. It is your responsibility as a patient to read this and to ask questions about anything that is not clear or that you believe was not covered in this document.  Patient's Rights: You have the right to refuse treatment. You also have the right to change your mind, even after initially having agreed to have the treatment done. However, under this last option, if you wait until the last second to change your mind, you may be charged for the materials used up to that point.  Introduction: Medicine is not an visual merchandiser. Everything in Medicine, including the lack of treatment(s), carries the potential for danger, harm, or loss (which is by definition: Risk). In Medicine, a complication is a secondary problem, condition, or disease that can aggravate an already existing one. All treatments carry the risk of possible complications. The fact that a side effects or complications occurs, does not imply that the treatment was conducted incorrectly. It must be clearly understood that these can happen even when everything is done following the highest safety standards.  No treatment: You can choose not to proceed with the proposed treatment alternative. The "PRO(s)" would include: avoiding the risk of complications associated with the therapy. The "CON(s)" would include:  not getting any of the treatment benefits. These benefits fall under one of three categories: diagnostic; therapeutic; and/or  palliative. Diagnostic benefits include: getting information which can ultimately lead to improvement of the disease or symptom(s). Therapeutic benefits are those associated with the successful treatment of the disease. Finally, palliative benefits are those related to the decrease of the primary symptoms, without necessarily curing the condition (example: decreasing the pain from a flare-up of a chronic condition, such as incurable terminal cancer).  General Risks and Complications: These are associated to most interventional treatments. They can occur alone, or in combination. They fall under one of the following six (6) categories: no benefit or worsening of symptoms; bleeding; infection; nerve damage; allergic reactions; and/or death. No benefits or worsening of symptoms: In Medicine there are no guarantees, only probabilities. No healthcare provider can ever guarantee that a medical treatment will work, they can only state the probability that it may. Furthermore, there is always the possibility that the condition may worsen, either directly, or indirectly, as a consequence of the treatment. Bleeding: This is more common if the patient is taking a blood thinner, either prescription or over the counter (example: Goody Powders, Fish oil, Aspirin, Garlic, etc.), or if suffering a condition associated with impaired coagulation (example: Hemophilia, cirrhosis of the liver, low platelet counts, etc.). However, even if you do not have one on these, it can still happen. If you have any of these conditions, or take one of these drugs, make sure to notify your treating physician. Infection: This is more common in patients with a compromised immune system, either due to disease (example: diabetes, cancer, human immunodeficiency virus [HIV], etc.), or due to medications or treatments (example: therapies used to treat cancer and rheumatological diseases). However, even if you do not have one on these, it can still  happen. If you have any of these conditions, or take one of these drugs, make sure to notify your treating physician. Nerve Damage: This is more common when the treatment is an invasive one, but it can also happen with the use of medications, such as those used in the treatment of cancer. The damage can occur to small secondary nerves, or to large primary ones, such as those in the spinal cord and brain. This damage may be temporary or permanent and it may lead to impairments that can range from temporary numbness to permanent paralysis and/or brain death. Allergic Reactions: Any time a substance or material comes in contact with our body, there is the possibility of an allergic reaction. These can range from a mild skin rash (contact dermatitis) to a severe systemic reaction (anaphylactic reaction), which can result in death. Death: In general, any medical intervention can result in death, most of the time due to an unforeseen complication. ______________________________________________________________________      ______________________________________________________________________    Steroid injections  Common steroids for injections Triamcinolone : Used by many sports medicine physicians for large joint and bursal injections, often combined with a local anesthetic like lidocaine . A study focusing on coccydynia (tailbone pain) found triamcinolone  was more effective than betamethasone, suggesting it may also be preferable for other localized inflammation conditions. Methylprednisolone : A common alternative to triamcinolone  that is also a strong anti-inflammatory. It is available in different formulations, with the acetate suspension being the long-acting option for intra-articular injections. Dexamethasone : This is a non-particulate steroid, meaning it has a lower risk of tissue damage compared to particulate steroids like triamcinolone  and methylprednisolone . While less common for this specific  use,  it is an option for targeted injections.   Considerations for physicians Particulate vs. non-particulate steroids: Triamcinolone  and methylprednisolone  are particulate, meaning they can clump together. Dexamethasone  is non-particulate. Particulate steroids are often preferred for their longer-lasting effects but carry a theoretical higher risk for certain injections (though this is less of a concern in the costochondral joints). Combined injectate: Corticosteroids are typically mixed with a local anesthetic like lidocaine  to provide both immediate pain relief (from the anesthetic) and longer-term inflammation reduction (from the steroid). Imaging guidance: To ensure accurate placement of the needle and medication, physicians may use ultrasound or fluoroscopic guidance for the injection, especially in complex or refractory cases.   Patient guidance Before undergoing a steroid injection, discuss the options with your physician. They will determine the best steroid, dosage, and procedure for your specific case based on factors like: Severity of your condition History of response to other treatments Your overall health status Experience and preference of the physician  Last  Updated: 11/25/2023 ______________________________________________________________________     ______________________________________________________________________    Patient information on: Body mass index (BMI) and Weight Management  Dear Ms. Sereno you are receiving this information because your weight may be adversely affecting your health.   Your current Estimated body mass index is 45.34 kg/m as calculated from the following:   Height as of 02/02/24: 5' 9 (1.753 m).   Weight as of 02/02/24: 307 lb (139.3 kg).  We recommend you talk to your primary care physician about providing or referring you to a supervised weight management program.  Here is some information about weight and the body mass index (BMI)  classification:  BMI is a measure of obesity that's calculated by dividing a person's weight in kilograms by their height in meters squared. A person can use an online calculator to determine their BMI. Body mass index (BMI) is a common tool for deciding whether a person has an appropriate body weight.  It measures a person's weight in relation to their height.  According to the [facility] of health (NIH): A BMI of less than 18.5 means that a person is underweight. A BMI of between 18.5 and 24.9 is ideal. A BMI of between 25 and 29.9 is overweight. A BMI over 30 indicates obesity.  Body Mass Index (BMI) Classification BMI level (kg/m2) Category Associated incidence of chronic pain  <18  Underweight   18.5-24.9 Ideal body weight   25-29.9 Overweight  20%  30-34.9 Obese (Class I)  68%  35-39.9 Severe obesity (Class II)  136%  >40 Extreme obesity (Class III)  254%    Morbidly Obese Classification: You will be considered to be Morbidly Obese if your BMI is above 30 and you have one or more of the following conditions caused or associated to obesity: 1.    Type 2 Diabetes (Leading to cardiovascular diseases (CVD), stroke, peripheral vascular diseases (PVD), retinopathy, nephropathy, and neuropathy) 2.    Cardiovascular Disease (High Blood Pressure; Congestive Heart Failure; High Cholesterol; Coronary Artery Disease; Angina; Arrhythmias, Dysrhythmias, or Heart Attacks) 3.    Breathing problems (Asthma; obesity-hypoventilation syndrome; obstructive sleep apnea; chronic inflammatory airway disease; reactive airway disease; or shortness of breath) 4.    Chronic kidney disease 5.    Liver disease (nonalcoholic fatty liver disease) 6.    High blood pressure 7.    Acid reflux (gastroesophageal reflux disease; heartburn) 8.    Osteoarthritis (OA) (affecting the hip(s), the knee(s) and/or the lower back) (usually requiring knee and/or hip replacements, as well as  back surgeries) 9.    Low  back pain (Lumbar Facet Syndrome; and/or Degenerative Disc Disease) 10.  Hip pain (Osteoarthritis of hip) (For every 1 lbs of added body weight, there is a 2 lbs increase in pressure inside of each hip articulation. 1:2 mechanical relationship) 11.  Knee pain (Osteoarthritis of knee) (For every 1 lbs of added body weight, there is a 4 lbs increase in pressure inside of each knee articulation. 1:4 mechanical relationship) (patients with a BMI>30 kg/m2 were 6.8 times more likely to develop knee OA than normal-weight individuals) 12.  Cancer: Epidemiological studies have shown that obesity is a risk factor for: post-menopausal breast cancer; cancers of the endometrium, colon and kidney cancer; malignant adenomas of the esophagus. Obese subjects have an approximately 1.5-3.5-fold increased risk of developing these cancers compared with normal-weight subjects, and it has been estimated that between 15 and 45% of these cancers can be attributed to overweight. More recent studies suggest that obesity may also increase the risk of other types of cancer, including pancreatic, hepatic and gallbladder cancer. (Ref: Obesity and cancer. Pischon T, Nthlings U, [employer] H. Proc Nutr Soc. 2008 May;67(2):128-45. doi: 10.1017/S0029665108006976.) The International Agency for Research on Cancer (IARC) has identified 13 cancers associated with overweight and obesity: meningioma, multiple myeloma, adenocarcinoma of the esophagus, and cancers of the thyroid, postmenopausal breast cancer, gallbladder, stomach, liver, pancreas, kidney, ovaries, uterus, colon and rectal (colorectal) cancers. 55 percent of all cancers diagnosed in women and 24 percent of those diagnosed in men are associated with overweight and obesity.  Recommendation: If you have any of the above conditions it is urgent that you take a step back and concentrate in losing weight. Dedicate 100% of your efforts on this task. Nothing else will improve your health more than  bringing your weight down and your BMI to less than 30.   Nutritionist and/or supervised weight-management program: We are aware that most chronic pain patients are unable to exercise secondary to their pain. For this reason, you must rely on proper nutrition and diet in order to lose the weight. We recommend you talk to a nutritionist.   Bariatric surgery: A person might be considered a candidate for bariatric surgery if they meet one of the following BMI criteria:  BMI of 40 or higher: This is considered extreme obesity (Class III). BMI of 35-39.9: This is considered obesity, and the person might also have a serious weight-related health condition, such as high blood pressure, type 2 diabetes, or severe sleep apnea  BMI of 30-34.9: This might be considered if the person has serious weight-related health problems and hasn't had substantial weight loss or improvement in co-morbidities through other methods   On your own: A realistic goal is to lose 10% of your body weight over a period of 12 months.  If over a period of six (6) months you have unsuccessfully tried to lose weight, then it is time for you to seek professional help and to enter a medically supervised weight management program, and/or undergo bariatric surgery.   Pain management considerations and possible limitations:  1.    Pharmacological Problems: Be advised that the use of opioid analgesics (oxycodone ; hydrocodone ; morphine ; methadone; codeine; and all of their derivatives) have been associated with decreased metabolism and weight gain.  For this reason, should we see that you are unable to lose weight while taking these medications, it may become necessary for us  to taper down and indefinitely discontinue them.  2.    Technical Problems:  The incidence of successful interventional therapies decreases as the patient's BMI increases. It is much more difficult to accomplish a safe and effective interventional therapy on a patient with a  BMI above 35. 3.    Radiation Exposure Problems: The x-rays machine, used to accomplish injection therapies, will automatically increase their x-ray output in order to capture an appropriate bone image. This means that radiation exposure increases exponentially with the patient's BMI. (The higher the BMI, the higher the radiation exposure.) Although the level of radiation used at a given time is still safe to the patient, it is not for the physician and/or assisting staff. Unfortunately, radiation exposure is accumulative. Because physicians and the staff have to do procedures and be exposed on a daily basis, this can result in health problems such as cancer and radiation burns. Radiation exposure to the staff is monitored by the radiation batches that they wear. The exposure levels are reported back to the staff on a quarterly basis. Depending on levels of exposure, physicians and staff may be obligated by law to decrease this exposure. This means that they have the right and obligation to refuse providing therapies where they may be overexposed to radiation. For this reason, physicians may decline to offer therapies such as radiofrequency ablation or implants to patients with a BMI above 40. 4.    Current Trends: Be advised that the current trend is to no longer offer certain therapies to patients with a BMI equal to, or above 35, due to increase perioperative risks, increased technical procedural difficulties, and excessive radiation exposure to healthcare personnel.  Last updated: 12/23/2022 ______________________________________________________________________

## 2024-02-12 ENCOUNTER — Other Ambulatory Visit: Payer: Self-pay | Admitting: Oncology

## 2024-02-12 ENCOUNTER — Encounter: Payer: Self-pay | Admitting: Oncology

## 2024-02-17 ENCOUNTER — Ambulatory Visit (HOSPITAL_BASED_OUTPATIENT_CLINIC_OR_DEPARTMENT_OTHER): Admitting: Pain Medicine

## 2024-02-17 ENCOUNTER — Ambulatory Visit
Admission: RE | Admit: 2024-02-17 | Discharge: 2024-02-17 | Disposition: A | Discharge status: Home / Self Care | Source: Ambulatory Visit | Attending: Pain Medicine | Admitting: Pain Medicine

## 2024-02-17 ENCOUNTER — Encounter: Payer: Self-pay | Admitting: Pain Medicine

## 2024-02-17 VITALS — BP 151/90 | HR 60 | Temp 97.7°F | Resp 16 | Ht 69.0 in | Wt 311.0 lb

## 2024-02-17 DIAGNOSIS — G8929 Other chronic pain: Secondary | ICD-10-CM | POA: Insufficient documentation

## 2024-02-17 DIAGNOSIS — S32030S Wedge compression fracture of third lumbar vertebra, sequela: Secondary | ICD-10-CM | POA: Insufficient documentation

## 2024-02-17 DIAGNOSIS — M47816 Spondylosis without myelopathy or radiculopathy, lumbar region: Secondary | ICD-10-CM | POA: Diagnosis not present

## 2024-02-17 DIAGNOSIS — R937 Abnormal findings on diagnostic imaging of other parts of musculoskeletal system: Secondary | ICD-10-CM | POA: Insufficient documentation

## 2024-02-17 DIAGNOSIS — S32020S Wedge compression fracture of second lumbar vertebra, sequela: Secondary | ICD-10-CM

## 2024-02-17 DIAGNOSIS — M545 Low back pain, unspecified: Secondary | ICD-10-CM | POA: Diagnosis present

## 2024-02-17 DIAGNOSIS — M5459 Other low back pain: Secondary | ICD-10-CM | POA: Insufficient documentation

## 2024-02-17 DIAGNOSIS — E66813 Obesity, class 3: Secondary | ICD-10-CM

## 2024-02-17 MED ORDER — PENTAFLUOROPROP-TETRAFLUOROETH EX AERO
INHALATION_SPRAY | Freq: Once | CUTANEOUS | Status: AC
  Administered 2024-02-17: 30 via TOPICAL

## 2024-02-17 MED ORDER — TRIAMCINOLONE ACETONIDE 40 MG/ML IJ SUSP
INTRAMUSCULAR | Status: AC
  Filled 2024-02-17: qty 2

## 2024-02-17 MED ORDER — ROPIVACAINE HCL 2 MG/ML IJ SOLN
18.0000 mL | Freq: Once | INTRAMUSCULAR | Status: AC
  Administered 2024-02-17: 18 mL via PERINEURAL

## 2024-02-17 MED ORDER — LIDOCAINE HCL 2 % IJ SOLN
20.0000 mL | Freq: Once | INTRAMUSCULAR | Status: AC
  Administered 2024-02-17: 400 mg

## 2024-02-17 MED ORDER — LIDOCAINE HCL 2 % IJ SOLN
INTRAMUSCULAR | Status: AC
  Filled 2024-02-17: qty 20

## 2024-02-17 MED ORDER — FENTANYL CITRATE (PF) 100 MCG/2ML IJ SOLN
25.0000 ug | INTRAMUSCULAR | Status: DC | PRN
  Administered 2024-02-17: 25 ug via INTRAVENOUS

## 2024-02-17 MED ORDER — FENTANYL CITRATE (PF) 100 MCG/2ML IJ SOLN
INTRAMUSCULAR | Status: AC
  Filled 2024-02-17: qty 2

## 2024-02-17 MED ORDER — TRIAMCINOLONE ACETONIDE 40 MG/ML IJ SUSP
80.0000 mg | Freq: Once | INTRAMUSCULAR | Status: AC
  Administered 2024-02-17: 40 mg

## 2024-02-17 MED ORDER — ROPIVACAINE HCL 2 MG/ML IJ SOLN
INTRAMUSCULAR | Status: AC
  Filled 2024-02-17: qty 20

## 2024-02-17 MED ORDER — MIDAZOLAM HCL 5 MG/5ML IJ SOLN
INTRAMUSCULAR | Status: AC
  Filled 2024-02-17: qty 5

## 2024-02-17 MED ORDER — MIDAZOLAM HCL 5 MG/5ML IJ SOLN
0.5000 mg | Freq: Once | INTRAMUSCULAR | Status: AC
  Administered 2024-02-17: 2 mg via INTRAVENOUS

## 2024-02-17 NOTE — Progress Notes (Signed)
 PROVIDER NOTE: Interpretation of information contained herein should be left to medically-trained personnel. Specific patient instructions are provided elsewhere under Patient Instructions section of medical record. This document was created in part using STT-dictation technology, any transcriptional errors that may result from this process are unintentional.  Patient: Michelle Mcmillan Type: Established DOB: [birth year] MRN: [id number] PCP: Lorel Maxie LABOR, MD  Service: Procedure DOS: 02/17/2024 Setting: Ambulatory Location: Ambulatory outpatient facility Delivery: Face-to-face Provider: Eric LABOR Como, MD Specialty: Interventional Pain Management Specialty designation: 09 Location: Outpatient facility Ref. Prov.: Aycock, Ngwe A, MD       Interventional Therapy   Type: Lumbar Facet, Medial Branch Block(s) (w/ fluoroscopic mapping) #1  Laterality: Bilateral  Level: L2, L3, L4, L5, and S1 Medial Branch/Dorsal Rami Level(s). Injecting these levels blocks the L3-4, L4-5, and L5-S1 lumbar facet joints. Imaging: Fluoroscopic guidance Spinal ([id number]) Anesthesia: Local anesthesia (1-2% Lidocaine ) Anxiolysis: IV Versed  2.0 mg            Sedation: Minimal Sedation Fentanyl  0.5 mL (25 mcg) DOS: 02/17/2024 Performed by: Eric LABOR Como, MD  Primary Purpose: Diagnostic/Therapeutic Indications: Low back pain severe enough to impact quality of life or function. 1. Chronic low back pain (1ry area of Pain) (Right) w/o sciatica   2. Lumbar facet joint pain   3. Low back pain of over 3 months duration   4. Lumbar facet arthropathy (Multilevel) (Bilateral)   5. Lumbar facet joint syndrome   6. Multifactorial low back pain   7. Spondylosis without myelopathy or radiculopathy, lumbar region   8. Compression fracture of L2 lumbar vertebra, sequela   9. Compression fracture of L3 lumbar vertebra, sequela   10. Abnormal MRI, lumbar spine (02/06/2024)   11. Obesity, Class III, BMI 40-49.9 (morbid  obesity) (HCC)    NAS-11 Pain score:   Pre-procedure: 4 /10   Post-procedure: 0-No pain/10     Position / Prep / Materials:  Position: Prone  Prep solution: ChloraPrep (2% chlorhexidine  gluconate and 70% isopropyl alcohol) Area Prepped: Posterolateral Lumbosacral Spine (Wide prep: From the lower border of the scapula down to the end of the tailbone and from flank to flank.)  Materials:  Tray: Block Needle(s):  Type: Spinal  Gauge (G): 22  Length: 5-in Qty: 4     H&P (Pre-op Assessment):  Michelle Mcmillan is a 53 y.o. (year old), female patient, seen today for interventional treatment. She  has a past surgical history that includes Cesarean section (1988); Tubal ligation (2004); Mass excision (N/A, 05/20/2016); Breast lumpectomy with needle localization (Right, 06/15/2015); Sentinel node biopsy (Right, 06/15/2015); Breast surgery; Colonoscopy with propofol  (N/A, 01/23/2017); Esophagogastroduodenoscopy (egd) with propofol  (N/A, 01/23/2017); Breast excisional biopsy (Right, 06/15/15); Breast biopsy (Right, 05/08/2017); Total mastectomy (Right, 05/22/2017); Total mastectomy (Left, 02/09/2018); Breast reconstruction with placement of tissue expander and flex hd (acellular hydrated dermis) (Left, 02/09/2018); Reconstruction breast w/ latissimus dorsi flap (Right, 06/01/2018); Latissimus flap to breast (Right, 06/01/2018); Removal of bilateral tissue expanders with placement of bilateral breast implants (Bilateral, 10/15/2018); Liposuction with lipofilling (Bilateral, 10/15/2018); Ulnar nerve transposition (Left, 01/29/2019); Scar revision (Bilateral, 04/22/2019); and Liposuction with lipofilling (Bilateral, 04/22/2019). Michelle Mcmillan has a current medication list which includes the following prescription(s): apple cider vinegar, bupropion , vitamin d3, cyclobenzaprine, diclofenac  sodium, dorzolamide-timolol, elderberry, iron , fluticasone, gabapentin , hydrochlorothiazide , hydroxyzine, ibuprofen , latanoprost, magnesium ,  magnesium  oxide, melatonin, omeprazole , rizatriptan, and topiramate , and the following Facility-Administered Medications: fentanyl . Her primarily concern today is the Back Pain (lower)  Initial Vital Signs:  Pulse/HCG Rate: 60ECG Heart Rate: 62 (nsr) Temp: 97.7  F (36.5 C) Resp: 18 BP: (!) 131/91 SpO2: 100 %  BMI: Estimated body mass index is 45.93 kg/m as calculated from the following:   Height as of this encounter: 5' 9 (1.753 m).   Weight as of this encounter: 311 lb (141.1 kg).  Risk Assessment: Allergies: Reviewed. She is allergic to nitroglycerin.  Allergy Precautions: None required Coagulopathies: Reviewed. None identified.  Blood-thinner therapy: None at this time Active Infection(s): Reviewed. None identified. Michelle Mcmillan is afebrile  Site Confirmation: Michelle Mcmillan was asked to confirm the procedure and laterality before marking the site Procedure checklist: Completed Consent: Before the procedure and under the influence of no sedative(s), amnesic(s), or anxiolytics, the patient was informed of the treatment options, risks and possible complications. To fulfill our ethical and legal obligations, as recommended by the American Medical Association's Code of Ethics, I have informed the patient of my clinical impression; the nature and purpose of the treatment or procedure; the risks, benefits, and possible complications of the intervention; the alternatives, including doing nothing; the risk(s) and benefit(s) of the alternative treatment(s) or procedure(s); and the risk(s) and benefit(s) of doing nothing. The patient was provided information about the general risks and possible complications associated with the procedure. These may include, but are not limited to: failure to achieve desired goals, infection, bleeding, organ or nerve damage, allergic reactions, paralysis, and death. In addition, the patient was informed of those risks and complications associated to Spine-related  procedures, such as failure to decrease pain; infection (i.e.: Meningitis, epidural or intraspinal abscess); bleeding (i.e.: epidural hematoma, subarachnoid hemorrhage, or any other type of intraspinal or peri-dural bleeding); organ or nerve damage (i.e.: Any type of peripheral nerve, nerve root, or spinal cord injury) with subsequent damage to sensory, motor, and/or autonomic systems, resulting in permanent pain, numbness, and/or weakness of one or several areas of the body; allergic reactions; (i.e.: anaphylactic reaction); and/or death. Furthermore, the patient was informed of those risks and complications associated with the medications. These include, but are not limited to: allergic reactions (i.e.: anaphylactic or anaphylactoid reaction(s)); adrenal axis suppression; blood sugar elevation that in diabetics may result in ketoacidosis or comma; water retention that in patients with history of congestive heart failure may result in shortness of breath, pulmonary edema, and decompensation with resultant heart failure; weight gain; swelling or edema; medication-induced neural toxicity; particulate matter embolism and blood vessel occlusion with resultant organ, and/or nervous system infarction; and/or aseptic necrosis of one or more joints. Finally, the patient was informed that Medicine is not an exact science; therefore, there is also the possibility of unforeseen or unpredictable risks and/or possible complications that may result in a catastrophic outcome. The patient indicated having understood very clearly. We have given the patient no guarantees and we have made no promises. Enough time was given to the patient to ask questions, all of which were answered to the patient's satisfaction. Ms. Wilbert has indicated that she wanted to continue with the procedure. Attestation: I, the ordering provider, attest that I have discussed with the patient the benefits, risks, side-effects, alternatives, likelihood of  achieving goals, and potential problems during recovery for the procedure that I have provided informed consent. Date  Time: 02/17/2024  9:07 AM  Pre-Procedure Preparation:  Monitoring: As per clinic protocol. Respiration, ETCO2, SpO2, BP, heart rate and rhythm monitor placed and checked for adequate function Safety Precautions: Patient was assessed for positional comfort and pressure points before starting the procedure. Time-out: I initiated and conducted the Time-out before starting  the procedure, as per protocol. The patient was asked to participate by confirming the accuracy of the Time Out information. Verification of the correct person, site, and procedure were performed and confirmed by me, the nursing staff, and the patient. Time-out conducted as per Joint Commission's Universal Protocol (UP.01.01.01). Time: 1015 Start Time: 1015 hrs.  Description of Procedure:          Laterality: (see above) Targeted Levels: (see above)  Safety Precautions: Aspiration looking for blood return was conducted prior to all injections. At no point did we inject any substances, as a needle was being advanced. Before injecting, the patient was told to immediately notify me if she was experiencing any new onset of ringing in the ears, or metallic taste in the mouth. No attempts were made at seeking any paresthesias. Safe injection practices and needle disposal techniques used. Medications properly checked for expiration dates. SDV (single dose vial) medications used. After the completion of the procedure, all disposable equipment used was discarded in the proper designated medical waste containers. Local Anesthesia: Protocol guidelines were followed. The patient was positioned over the fluoroscopy table. The area was prepped in the usual manner. The time-out was completed. The target area was identified using fluoroscopy. A 12-in long, straight, sterile hemostat was used with fluoroscopic guidance to  locate the targets for each level blocked. Once located, the skin was marked with an approved surgical skin marker. Once all sites were marked, the skin (epidermis, dermis, and hypodermis), as well as deeper tissues (fat, connective tissue and muscle) were infiltrated with a small amount of a short-acting local anesthetic, loaded on a 10cc syringe with a 25G, 1.5-in  Needle. An appropriate amount of time was allowed for local anesthetics to take effect before proceeding to the next step. Local Anesthetic: Lidocaine  2.0% The unused portion of the local anesthetic was discarded in the proper designated containers. Technical description of process:  Medial Branch  Dorsal Rami Nerve Block (MBB):  Neuroanatomy note: Each lumbar facet joint receives dual innervation from medial branches arising from the posterior primary rami at the same level and one level above. The target for each lumbar medial branch is the junction of the ipsilateral superior articular and transverse process of the lower vertebral body. (i.e.: The L4-L5 facet joint is innervated by the L4 medial branch [located at L5] and the L3 medial branch [located at L4]. Blocking the L4 Medial Branch is therefore achieved by injecting at the junction of the ipsilateral superior articular and transverse process of the lower vertebral body [L5].).  Exception: The exception to the above rule is the L5-S1 facet joint which has triple innervation requiring the L4 medial branch, as well as the L5 and the S1 Dorsal Rami(s) to be blocked to fully denervate the joint.  Under fluoroscopic guidance, a needle was inserted until contact was made with os over the target area. After negative aspiration, 0.5 mL of the nerve block solution was injected without difficulty or complication. Paresthesia were avoided during injection. The needle(s) were removed intact and without complication.  Once the entire procedure was completed, the treated area was cleaned, making  sure to leave some of the prepping solution back to take advantage of its long term bactericidal properties.         Illustration of the posterior view of the lumbar spine and the posterior neural structures. Laminae of L2 through S1 are labeled. DPRL5, dorsal primary ramus of L5; DPRS1, dorsal primary ramus of S1; DPR3, dorsal primary ramus  of L3; FJ, facet (zygapophyseal) joint L3-L4; I, inferior articular process of L4; LB1, lateral branch of dorsal primary ramus of L1; IAB, inferior articular branches from L3 medial branch (supplies L4-L5 facet joint); IBP, intermediate branch plexus; MB3, medial branch of dorsal primary ramus of L3; NR3, third lumbar nerve root; S, superior articular process of L5; SAB, superior articular branches from L4 (supplies L4-5 facet joint also); TP3, transverse process of L3.   Facet Joint Innervation (* possible contribution)  L1-2 T12, L1 (L2*)  Medial Branch  L2-3 L1, L2 (L3*)                     L3-4 L2, L3 (L4*)                     L4-5 L3, L4 (L5*)                     L5-S1 L4, L5, S1                        Vitals:   02/17/24 1029 02/17/24 1035 02/17/24 1045 02/17/24 1055  BP: 98/63 131/82 (!) 148/92 (!) 151/90  Pulse:      Resp: 15 16 15 16   Temp:      TempSrc:      SpO2: 100% 100% 99%   Weight:      Height:         End Time: 1029 hrs.  Imaging Guidance (Spinal):         Type of Imaging Technique: Fluoroscopy Guidance (Spinal) Indication(s): Fluoroscopy guidance for needle placement to enhance accuracy in procedures requiring precise needle localization for targeted delivery of medication in or near specific anatomical locations not easily accessible without such real-time imaging assistance. Exposure Time: Please see nurses notes. Contrast: None used. Fluoroscopic Guidance: I was personally present during the use of fluoroscopy. Tunnel Vision Technique used to obtain the best possible view of the target area. Parallax error  corrected before commencing the procedure. Direction-depth-direction technique used to introduce the needle under continuous pulsed fluoroscopy. Once target was reached, antero-posterior, oblique, and lateral fluoroscopic projection used confirm needle placement in all planes. Images permanently stored in EMR. Interpretation: No contrast injected. I personally interpreted the imaging intraoperatively. Adequate needle placement confirmed in multiple planes. Permanent images saved into the patient's record.  Post-operative Assessment:  Post-procedure Vital Signs:  Pulse/HCG Rate: 6063 Temp: 97.7 F (36.5 C) Resp: 16 BP: (!) 151/90 SpO2: 99 %  EBL: None  Complications: No immediate post-treatment complications observed by team, or reported by patient.  Note: The patient tolerated the entire procedure well. A repeat set of vitals were taken after the procedure and the patient was kept under observation following institutional policy, for this type of procedure. Post-procedural neurological assessment was performed, showing return to baseline, prior to discharge. The patient was provided with post-procedure discharge instructions, including a section on how to identify potential problems. Should any problems arise concerning this procedure, the patient was given instructions to immediately contact us , at any time, without hesitation. In any case, we plan to contact the patient by telephone for a follow-up status report regarding this interventional procedure.  Comments:  No additional relevant information.  Plan of Care (POC)  Orders:  Orders Placed This Encounter  Procedures   LUMBAR FACET(MEDIAL BRANCH NERVE BLOCK) MBNB    Scheduling Instructions:     Procedure: Lumbar facet block (AKA.: Lumbosacral medial  branch nerve block)     Side: Bilateral     Level: L3-4, L4-5, L5-S1, and TBD Facets (L2, L3, L4, L5, S1, and TBD Medial Branch Nerves)     Sedation: Patient's choice.     Date:  02/17/2024    Where will this procedure be performed?:   [facility]   DG [facility] C-ARM 1-60 MIN NO REPORT    Intraoperative interpretation by procedural physician at [facility].    Standing Status:   Standing    Number of Occurrences:   1    Reason for exam::   Assistance in needle guidance and placement for procedures requiring needle placement in or near specific anatomical locations not easily accessible without such assistance.   Informed Consent Details: Physician/Practitioner Attestation; Transcribe to consent form and obtain patient signature    Nursing Order: Transcribe to consent form and obtain patient signature. Note: Always confirm laterality of pain with Ms. Mitchell, before procedure.    Physician/Practitioner attestation of informed consent for procedure/surgical case:   I, the physician/practitioner, attest that I have discussed with the patient the benefits, risks, side effects, alternatives, likelihood of achieving goals and potential problems during recovery for the procedure that I have provided informed consent.    Procedure:   Lumbar Facet Block  under fluoroscopic guidance    Physician/Practitioner performing the procedure:   Wylene Weissman A. Tanya MD    Indication/Reason:   Low Back Pain, with our without leg pain, due to Facet Joint Arthralgia (Joint Pain) Spondylosis (Arthritis of the Spine), without myelopathy or radiculopathy (Nerve Damage).   Provide equipment / supplies at bedside    Procedure tray: Block Tray (Disposable  single use) Skin infiltration needle: Regular 1.5-in, 25-G, (x1) Block Needle type: Spinal Amount/quantity: 4 Size: Medium (5-inch) Gauge: 22G    Standing Status:   Standing    Number of Occurrences:   1    Specify:   Block Tray   Saline lock IV    Have LR [phone] mL available and administer at 125 mL/hr if patient becomes hypotensive.    Standing Status:   Standing    Number of Occurrences:   1     Opioid  Analgesic: None MME/day: 0 mg/day    Medications ordered for procedure: Meds ordered this encounter  Medications   lidocaine  (XYLOCAINE ) 2 % (with pres) injection 400 mg   pentafluoroprop-tetrafluoroeth (GEBAUERS) aerosol   midazolam  (VERSED ) 5 MG/5ML injection 0.5-2 mg    Make sure Flumazenil is available in the pyxis when using this medication. If oversedation occurs, administer 0.2 mg IV over 15 sec. If after 45 sec no response, administer 0.2 mg again over 1 min; may repeat at 1 min intervals; not to exceed 4 doses (1 mg)   fentaNYL  (SUBLIMAZE ) injection 25-50 mcg    Make sure Narcan is available in the pyxis when using this medication. In the event of respiratory depression (RR< 8/min): Titrate NARCAN (naloxone) in increments of 0.1 to 0.2 mg IV at 2-3 minute intervals, until desired degree of reversal.   ropivacaine (PF) 2 mg/mL (0.2%) (NAROPIN) injection 18 mL   triamcinolone  acetonide (KENALOG -40) injection 80 mg   Medications administered: We administered lidocaine , pentafluoroprop-tetrafluoroeth, midazolam , fentaNYL , ropivacaine (PF) 2 mg/mL (0.2%), and triamcinolone  acetonide.  See the medical record for exact dosing, route, and time of administration.    Interventional Therapies  Risk Factors  Considerations  Medical Comorbidities:  Hx. Breast Cancer (B) Mastectomy, Chemotherapy, XRT  OSA on CPAP  MO (BMI>40)  HTN  GERD     Planned  Pending:   Diagnostic/therapeutic bilateral lumbar facet MBB #1    Under consideration:   Diagnostic/therapeutic right L2-3 LESI #1  Diagnostic/therapeutic right vs bilateral lumbar facet MBB #1    Completed: (Analgesic benefit)1  Diagnostic lumbar spine MRI (02/06/2024)    Therapeutic  Palliative (PRN) options:   None established   Completed by other providers:   None reported  1(Analgesic benefit): Expressed in percentage (%). (Local anesthetic[LA] +/- sedation  L.A.Local Anesthetic  Steroid benefit  Ongoing benefit)       Follow-up plan:   Return in about 2 weeks (around 03/02/2024) for (Face2F), (PPE).     Recent Visits Date Type Provider Dept  02/11/24 Office Visit Tanya Glisson, MD [facility]  02/02/24 Office Visit Tanya Glisson, MD [facility]  12/31/23 Office Visit Tanya Glisson, MD [facility]  Showing recent visits within past 90 days and meeting all other requirements Today's Visits Date Type Provider Dept  02/17/24 Procedure visit Tanya Glisson, MD [facility]  Showing today's visits and meeting all other requirements Future Appointments Date Type Provider Dept  03/08/24 Appointment Tanya Glisson, MD [facility]  Showing future appointments within next 90 days and meeting all other requirements   Disposition: Discharge home  Discharge (Date  Time): 02/17/2024; 1100 hrs.   Primary Care Physician: Lorel Maxie LABOR, MD Location: [facility] Note by: Glisson LABOR Tanya, MD (TTS technology used. I apologize for any typographical errors that were not detected and corrected.) Date: 02/17/2024; Time: 11:03 AM  Disclaimer:  Medicine is not an visual merchandiser. The only guarantee in medicine is that nothing is guaranteed. It is important to note that the decision to proceed with this intervention was based on the information collected from the patient. The Data and conclusions were drawn from the patient's questionnaire, the interview, and the physical examination. Because the information was provided in large part by the patient, it cannot be guaranteed that it has not been purposely or unconsciously manipulated. Every effort has been made to obtain as much relevant data as possible for this evaluation. It is important to note that the conclusions that lead to this procedure are derived in large part from the available data. Always take into account that the treatment will also be dependent on  availability of resources and existing treatment guidelines, considered by other Pain Management Practitioners as being common knowledge and practice, at the time of the intervention. For Medico-Legal purposes, it is also important to point out that variation in procedural techniques and pharmacological choices are the acceptable norm. The indications, contraindications, technique, and results of the above procedure should only be interpreted and judged by a Board-Certified Interventional Pain Specialist with extensive familiarity and expertise in the same exact procedure and technique.

## 2024-02-18 ENCOUNTER — Telehealth: Payer: Self-pay | Admitting: *Deleted

## 2024-02-18 NOTE — Telephone Encounter (Signed)
 Post procedure call:   no  questions or concerns.

## 2024-03-02 ENCOUNTER — Other Ambulatory Visit (HOSPITAL_COMMUNITY): Payer: Self-pay | Admitting: Surgery

## 2024-03-04 ENCOUNTER — Encounter: Attending: Surgery | Admitting: Dietician

## 2024-03-04 ENCOUNTER — Encounter: Payer: Self-pay | Admitting: Dietician

## 2024-03-04 VITALS — Ht 69.0 in | Wt 315.1 lb

## 2024-03-04 DIAGNOSIS — Z6841 Body Mass Index (BMI) 40.0 and over, adult: Secondary | ICD-10-CM | POA: Insufficient documentation

## 2024-03-04 DIAGNOSIS — E669 Obesity, unspecified: Secondary | ICD-10-CM | POA: Insufficient documentation

## 2024-03-04 DIAGNOSIS — Z713 Dietary counseling and surveillance: Secondary | ICD-10-CM | POA: Diagnosis not present

## 2024-03-04 NOTE — Progress Notes (Signed)
 Nutrition Assessment for Bariatric Surgery: Pre-Surgery Behavioral and Nutrition Intervention Program   Medical Nutrition Therapy  Appt Start Time: 0946    End Time: 1044  Patient was seen on 03/04/2024 for Pre-Operative Nutrition Assessment. Purpose of todays visit  enhance perioperative outcomes along with a healthy weight maintenance   Referral stated Supervised Weight Loss (SWL) visits needed: 0  Not cleared at this time:  Pt to follow up for minimum of one more visit to assist pt with progressing through stages of change/further nutrition education. RD advised pt that this follow up visit is not mandated through insurance. Pt verbalized agreement.  Planned surgery: Sleeve Gastrectomy Pt expectation of surgery: to lose 100 lbs  NUTRITION ASSESSMENT   Anthropometrics  Start weight at [facility]: 315.1 lbs (date: 03/04/2024)  Height: 69 in BMI: 46.53 kg/m2     Clinical   Pharmacotherapy: History of weight loss medication used: phentermine, stopped due to too many side effects  Medical hx: anemia, arthritis, back pain, breast cancer, HTN, migraines, depression, sleep apnea, obesity Medications: gabapentin , omeprazole , hydrochlorothiazide , iron , vit D3, bupropion , magnesium , topiramate , latanoprost  Labs: sodium 145, glucose 117 (pt states she is going to get labs for the pathway on the 15th of December) Notable signs/symptoms: walking with a cane Any previous deficiencies? No  Evaluation of Nutritional Deficiencies: Micronutrient Nutrition Focused Physical Exam: Hair: No issues observed Eyes: No issues observed Mouth: No issues observed Neck: No issues observed Nails: No issues observed Skin: No issues observed  Lifestyle & Dietary Hx Pt arrived with husband, Joe. Pt states most of her weight loss attempts have been fad diets.  Pt states her surgeon recommended the sleeve surgery.  Current Physical Activity Recommendations state 150 minutes per week of moderate to vigorous  movement including Cardio and 1-2 days of resistance activities as well as flexibility/balance activities:  Pts current physical activity: ADL's, with 0% recommendation reached   Sleep Hygiene: duration and quality: pt states once she gets to sleep it is okay, stating she still wakes during the night. Pt states she uses a CPAP machine.  Current Patient Perceived Stress Level as stated by pt on a scale of 1-10:  4       Stress Management Techniques: listen to music or read the Bible  According to the Dietary Guidelines for Americans Recommendation: equivalent 1.5-2 cups fruits per day, equivalent 2-3 cups vegetables per day and at least half all grains whole  Fruit servings per day (on average): 0 (2-3 a week), meeting 0% recommendation  Non-starchy vegetable servings per day (on average): 1, meeting 33-50% recommendation  Whole Grains per day (on average): 0-1  Number of meals missed/skipped per week out of 21: 7-10  24-Hr Dietary Recall First Meal: cereal with 2% milk or breakfast buiscut with eggs and sausage or sausage, egg and whole wheat bread Snack: crackers (NABS) or bag of chips or banana or yogurt Second Meal: skip Snack: maybe another snack Third Meal: pizza (last night) or spaghetti or hamburger soup or pork chops and broccoli or turkey and greens and pinto beans or wraps or baked chicken with yellow rice and cabbage Snack: popcorn Beverages: water, water with flavorings, once or twice a week half and half tea, coffee once or twice a week  Alcoholic beverages per week: 0   Estimated Energy Needs Calories: 1500  NUTRITION DIAGNOSIS  Overweight/obesity ([place]-3.3) related to past poor dietary habits and physical inactivity as evidenced by patient w/ planned sleeve surgery following dietary guidelines for continued weight  loss.  NUTRITION INTERVENTION  Nutrition counseling (C-1) and education (E-2) to facilitate bariatric surgery goals.  Educated pt on micronutrient  deficiencies post-surgery and behavioral/dietary strategies to start in order to mitigate that risk   Behavioral and Dietary Interventions Pre-Op Goals Reviewed with the Patient Nutrition: Healthy Eating Behaviors Switch to non-caloric, non-carbonated and non-caffeinated beverages such as  water, unsweetened tea, Crystal Light and zero calorie beverages (aim for 64 oz. per day) Cut out grazing between meals or at night  Find a protein shake you like Eat every 3-5 hours; avoid skipping meals        Eliminate distractions while eating (TV, computer, reading, driving, texting) Take 79-69 minutes to eat a meal  Decrease high sugar foods/decrease high fat/fried foods Eliminate alcoholic beverages Increase protein intake (eggs, fish, chicken, yogurt) before surgery Eat non starchy vegetables 2 times a day 7 days a week Eat complex carbohydrates such as whole grains and fruits   Behavioral Modification: Physical Activity Increase my usual daily activity (use stairs, park farther, etc.) Engage in _______________________  activity  _______ minutes ______ times per week  Other:    *Goals that are bolded indicate the pt would like to start working towards these  Handouts Provided Include  Bariatric Surgery handouts (Nutrition Visits, Pre Surgery Behavioral Change Goals, Protein Shakes Brands to Choose From, Vitamins & Mineral Supplementation)  Learning Style & Readiness for Change Teaching method utilized: Visual, Auditory, and hands on  Demonstrated degree of understanding via: Teach Back  Readiness Level: preparation Barriers to learning/adherence to lifestyle change: mobility  RD's Notes for Next Visit Patient progress towards chosen goals   MONITORING & EVALUATION Dietary intake, weekly physical activity, body weight, and preoperative behavioral change goals   Next Steps  Patient is to follow up at [facility] in 2-3 weeks for minimum of one more visit to assist pt with progressing through  stages of change/further nutrition education.

## 2024-03-07 NOTE — Progress Notes (Unsigned)
 PROVIDER NOTE: Interpretation of information contained herein should be left to medically-trained personnel. Specific patient instructions are provided elsewhere under Patient Instructions section of medical record. This document was created in part using AI and STT-dictation technology, any transcriptional errors that may result from this process are unintentional.  Patient: Michelle Mcmillan  Service: E/M   PCP: Lorel Maxie LABOR, MD  DOB: 1971/03/13  DOS: 03/08/2024  Provider: Eric LABOR Como, MD  MRN: [id number]  Delivery: Face-to-face  Specialty: Interventional Pain Management  Type: Established Patient  Setting: Ambulatory outpatient facility  Specialty designation: 09  Referring Prov.: Lorel Maxie LABOR, MD  Location: Outpatient office facility       History of present illness (HPI) [name], a 53 y.o. year old female, is here today because of her Chronic right-sided low back pain without sciatica [M54.50, G89.29]. Michelle Mcmillan primary complain today is No chief complaint on file.  Pertinent problems: Michelle Mcmillan has Malignant neoplasm of upper-outer quadrant of right female breast (HCC); Breast cancer (HCC); History of mastectomy (Bilateral); S/P breast reconstruction (Bilateral); Ulnar neuropathy; Limited joint range of motion (ROM); Chronic knee pain (Left); Migraine without status migrainosus, not intractable; Breast pain in female; Chronic neck pain (Bilateral); Chronic flank pain (Right); Malignant neoplasm of unspecified site of right female breast (HCC); Malignant neoplasm of right breast in female, estrogen receptor positive (HCC); Cluster headache; Migraine headache; Osteoarthritis of knee (Left); Leg pain (Left); Bursitis of hip (Left); Chronic pain syndrome; Chronic mid back pain; Chronic thoracic back pain (Right); Chronic hip pain (Left); Cervicalgia; Cervicogenic headache; Occipital headache (Greater) (Bilateral); Chronic intermittent breast pain s/p implant (Right); History of  reconstruction of breasts (Bilateral); Silicone breast implant in situ (Bilateral); Compression fracture of L2 lumbar vertebra, sequela; History of cancer of breast (Right); Osteoarthritis of hip (Bilateral); Compression fracture of L3 lumbar vertebra, sequela; Abnormal MRI, lumbar spine (02/06/2024); Chronic low back pain (1ry area of Pain) (Right) w/o sciatica; Low back pain of over 3 months duration; Multifactorial low back pain; Vertebrogenic low back pain; Lumbar facet arthropathy (Multilevel) (Bilateral); Lumbar facet joint pain; Tricompartment osteoarthritis of knee (Left); DDD (degenerative disc disease), cervical; DDD (degenerative disc disease), lumbar; Osteoarthritis involving multiple joints; Lumbar facet joint syndrome; and Spondylosis without myelopathy or radiculopathy, lumbar region on their pertinent problem list.  Pain Assessment: Severity of   is reported as a  /10. Location:    / . Onset:  . Quality:  . Timing:  . Modifying factor(s):  SABRA Vitals:  vitals were not taken for this visit.  BMI: Estimated body mass index is 46.53 kg/m as calculated from the following:   Height as of 03/04/24: 5' 9 (1.753 m).   Weight as of 03/04/24: 315 lb 1.6 oz (142.9 kg).  Last encounter: 02/11/2024. Last procedure: 02/17/2024.  Reason for encounter: post-procedure evaluation and assessment.   Discussed the use of AI scribe software for clinical note transcription with the patient, who gave verbal consent to proceed.  History of Present Illness          Post-Procedure Evaluation   Type: Lumbar Facet, Medial Branch Block(s) (w/ fluoroscopic mapping) #1  Laterality: Bilateral  Level: L2, L3, L4, L5, and S1 Medial Branch/Dorsal Rami Level(s). Injecting these levels blocks the L3-4, L4-5, and L5-S1 lumbar facet joints. Imaging: Fluoroscopic guidance Spinal ([id number]) Anesthesia: Local anesthesia (1-2% Lidocaine ) Anxiolysis: IV Versed  2.0 mg            Sedation: Minimal Sedation Fentanyl  0.5 mL  (25 mcg) DOS: 02/17/2024 Performed  by: Eric DELENA Como, MD  Primary Purpose: Diagnostic/Therapeutic Indications: Low back pain severe enough to impact quality of life or function. 1. Chronic low back pain (1ry area of Pain) (Right) w/o sciatica   2. Lumbar facet joint pain   3. Low back pain of over 3 months duration   4. Lumbar facet arthropathy (Multilevel) (Bilateral)   5. Lumbar facet joint syndrome   6. Multifactorial low back pain   7. Spondylosis without myelopathy or radiculopathy, lumbar region   8. Compression fracture of L2 lumbar vertebra, sequela   9. Compression fracture of L3 lumbar vertebra, sequela   10. Abnormal MRI, lumbar spine (02/06/2024)   11. Obesity, Class III, BMI 40-49.9 (morbid obesity) (HCC)    NAS-11 Pain score:   Pre-procedure: 4 /10   Post-procedure: 0-No pain/10     Effectiveness:  Initial hour after procedure:   ***. Subsequent 4-6 hours post-procedure:   ***. Analgesia past initial 6 hours:   ***. Ongoing improvement:  Analgesic:  *** Function:    ***    ROM:    ***    Interpretation: ***  Pharmacotherapy Assessment   Opioid Analgesic: None MME/day: 0 mg/day   Monitoring: [place] PMP: PDMP reviewed during this encounter.       Pharmacotherapy: No side-effects or adverse reactions reported. Compliance: No problems identified. Effectiveness: Clinically acceptable.  No notes on file  UDS:  Summary  Date Value Ref Range Status  12/31/2023 FINAL  Final    Comment:    ==================================================================== Compliance Drug Analysis, Ur ==================================================================== Test                             Result       Flag       Units  Drug Absent but Declared for Prescription Verification   Gabapentin                      Not Detected UNEXPECTED   Topiramate                      Not Detected UNEXPECTED   Cyclobenzaprine                Not Detected UNEXPECTED   Bupropion                       Not Detected UNEXPECTED   Diclofenac                      Not Detected UNEXPECTED    Topical diclofenac , as indicated in the declared medication list, is    not always detected even when used as directed.    Ibuprofen                      Not Detected UNEXPECTED    Ibuprofen, as indicated in the declared medication list, is not    always detected even when used as directed.  ==================================================================== Test                      Result    Flag   Units      Ref Range   Creatinine              168              mg/dL      >=79 ==================================================================== Declared Medications:  The  flagging and interpretation on this report are based on the  following declared medications.  Unexpected results may arise from  inaccuracies in the declared medications.   **Note: The testing scope of this panel includes these medications:   Bupropion  (Wellbutrin )  Cyclobenzaprine (Flexeril)  Gabapentin   Topiramate  (Topamax )   **Note: The testing scope of this panel does not include small to  moderate amounts of these reported medications:   Ibuprofen  (Advil )  Topical Diclofenac  (Voltaren )   **Note: The testing scope of this panel does not include the  following reported medications:   Eye Drops  Fluticasone (Flonase)  Hydrochlorothiazide  (Microzide )  Iron   Magnesium   Melatonin  Omeprazole  (Prilosec)  Rizatriptan (Maxalt)  Supplement  Vitamin D3 ==================================================================== For clinical consultation, please call [phone]. ====================================================================     No results found for: CBDTHCR No results found for: D8THCCBX No results found for: D9THCCBX  ROS  Constitutional: Denies any fever or chills Gastrointestinal: No reported hemesis, hematochezia, vomiting, or acute GI distress Musculoskeletal: Denies  any acute onset joint swelling, redness, loss of ROM, or weakness Neurological: No reported episodes of acute onset apraxia, aphasia, dysarthria, agnosia, amnesia, paralysis, loss of coordination, or loss of consciousness  Medication Review  Apple Cider Vinegar, Elderberry, Iron , Magnesium , Melatonin, Vitamin D3, buPROPion , cyclobenzaprine, diclofenac  sodium, dorzolamide-timolol, fluticasone, gabapentin , hydrOXYzine, hydrochlorothiazide , ibuprofen , latanoprost, magnesium  oxide, omeprazole , rizatriptan, and topiramate   History Review  Allergy: Ms. Aispuro is allergic to nitroglycerin. Drug: Ms. Yontz  reports no history of drug use. Alcohol:  reports that she does not currently use alcohol. Tobacco:  reports that she has never smoked. She has never used smokeless tobacco. Social: Ms. Cove  reports that she has never smoked. She has never used smokeless tobacco. She reports that she does not currently use alcohol. She reports that she does not use drugs. Medical:  has a past medical history of Anemia, Anemia, Arthritis, Back pain, Breast cancer (HCC) (2017), Bursitis, Cancer (HCC), Carpal tunnel syndrome, Constipation, Depression, Dyspnea, GERD (gastroesophageal reflux disease), Headache, Hypertension, Intractable nausea and vomiting (08/11/2017), Joint pain, Last menstrual period (LMP) > 10 days ago (08/2015), Lower extremity edema, Migraines, Neuropathy, OSA (obstructive sleep apnea), Personal history of radiation therapy, Prediabetes, and SOB (shortness of breath). Surgical: Ms. Cardosa  has a past surgical history that includes Cesarean section (1988); Tubal ligation (2004); Mass excision (N/A, 05/20/2016); Breast lumpectomy with needle localization (Right, 06/15/2015); Sentinel node biopsy (Right, 06/15/2015); Breast surgery; Colonoscopy with propofol  (N/A, 01/23/2017); Esophagogastroduodenoscopy (egd) with propofol  (N/A, 01/23/2017); Breast excisional biopsy (Right, 06/15/15); Breast biopsy (Right,  05/08/2017); Total mastectomy (Right, 05/22/2017); Total mastectomy (Left, 02/09/2018); Breast reconstruction with placement of tissue expander and flex hd (acellular hydrated dermis) (Left, 02/09/2018); Reconstruction breast w/ latissimus dorsi flap (Right, 06/01/2018); Latissimus flap to breast (Right, 06/01/2018); Removal of bilateral tissue expanders with placement of bilateral breast implants (Bilateral, 10/15/2018); Liposuction with lipofilling (Bilateral, 10/15/2018); Ulnar nerve transposition (Left, 01/29/2019); Scar revision (Bilateral, 04/22/2019); and Liposuction with lipofilling (Bilateral, 04/22/2019). Family: family history includes Breast cancer in her paternal aunt and paternal aunt; Breast cancer (age of onset: 25) in her cousin; Diabetes in her father; Hypertension in her father, mother, and sister; Obesity in her mother.  Laboratory Chemistry Profile   Renal Lab Results  Component Value Date   BUN 12 12/31/2023   CREATININE 0.97 12/31/2023   BCR 12 12/31/2023   GFRAA >60 01/25/2019   GFRNONAA >60 11/19/2021    Hepatic Lab Results  Component Value Date   AST 21 12/31/2023   ALT 15  06/24/2022   ALBUMIN 3.8 12/31/2023   ALKPHOS 112 12/31/2023    Electrolytes Lab Results  Component Value Date   NA 145 (H) 12/31/2023   K 4.2 12/31/2023   CL 105 12/31/2023   CALCIUM  9.0 12/31/2023   MG 1.9 12/31/2023    Bone Lab Results  Component Value Date   VD25OH 39.5 09/25/2022   25OHVITD1 40 12/31/2023   25OHVITD2 <1.0 12/31/2023   25OHVITD3 40 12/31/2023    Inflammation (CRP: Acute Phase) (ESR: Chronic Phase) Lab Results  Component Value Date   CRP 2 12/31/2023   ESRSEDRATE 44 (H) 12/31/2023         Note: Above Lab results reviewed.  Recent Imaging Review  DG PAIN CLINIC C-ARM 1-60 MIN NO REPORT Fluoro was used, but no Radiologist interpretation will be provided.  Please refer to NOTES tab for provider progress note. Note: Reviewed        Physical Exam  Vitals: LMP  07/30/2017  BMI: Estimated body mass index is 46.53 kg/m as calculated from the following:   Height as of 03/04/24: 5' 9 (1.753 m).   Weight as of 03/04/24: 315 lb 1.6 oz (142.9 kg). Ideal: Ideal body weight: 66.2 kg (145 lb 15.1 oz) Adjusted ideal body weight: 96.9 kg (213 lb 9.7 oz) General appearance: Well nourished, well developed, and well hydrated. In no apparent acute distress Mental status: Alert, oriented x 3 (person, place, & time)       Respiratory: No evidence of acute respiratory distress Eyes: PERLA   Assessment   Diagnosis Status  1. Chronic low back pain (1ry area of Pain) (Right) w/o sciatica   2. Lumbar facet joint pain   3. Low back pain of over 3 months duration   4. Lumbar facet joint syndrome   5. Postop check    Controlled Controlled Controlled   Updated Problems: No problems updated.  Plan of Care  Problem-specific:  Assessment and Plan            Ms. Gwendelyn Lanting has a current medication list which includes the following long-term medication(s): bupropion , iron , fluticasone, gabapentin , hydrochlorothiazide , omeprazole , rizatriptan, and topiramate .  Pharmacotherapy (Medications Ordered): No orders of the defined types were placed in this encounter.  Orders:  No orders of the defined types were placed in this encounter.    Interventional Therapies  Risk Factors  Considerations  Medical Comorbidities:  Hx. Breast Cancer (B) Mastectomy, Chemotherapy, XRT  OSA on CPAP  MO (BMI>40)  HTN  GERD     Planned  Pending:   Diagnostic/therapeutic bilateral lumbar facet MBB #1    Under consideration:   Diagnostic/therapeutic right L2-3 LESI #1  Diagnostic/therapeutic right vs bilateral lumbar facet MBB #1    Completed: (Analgesic benefit)1  Diagnostic lumbar spine MRI (02/06/2024)    Therapeutic  Palliative (PRN) options:   None established   Completed by other providers:   None reported  1(Analgesic benefit): Expressed in percentage  (%). (Local anesthetic[LA] +/- sedation  L.A.Local Anesthetic  Steroid benefit  Ongoing benefit)     No follow-ups on file.    Recent Visits Date Type Provider Dept  02/17/24 Procedure visit Tanya Glisson, MD [facility]  02/11/24 Office Visit Tanya Glisson, MD [facility]  02/02/24 Office Visit Tanya Glisson, MD [facility]  12/31/23 Office Visit Tanya Glisson, MD [facility]  Showing recent visits within past 90 days and meeting all other requirements Future Appointments Date Type Provider Dept  03/08/24 Appointment Chaun Uemura,  Eric, MD [facility]  Showing future appointments within next 90 days and meeting all other requirements  I discussed the assessment and treatment plan with the patient. The patient was provided an opportunity to ask questions and all were answered. The patient agreed with the plan and demonstrated an understanding of the instructions.  Patient advised to call back or seek an in-person evaluation if the symptoms or condition worsens.  Duration of encounter: *** minutes.  Total time on encounter, as per AMA guidelines included both the face-to-face and non-face-to-face time personally spent by the physician and/or other qualified health care professional(s) on the day of the encounter (includes time in activities that require the physician or other qualified health care professional and does not include time in activities normally performed by clinical staff). Physician's time may include the following activities when performed: Preparing to see the patient (e.g., pre-charting review of records, searching for previously ordered imaging, lab work, and nerve conduction tests) Review of prior analgesic pharmacotherapies. Reviewing PMP Interpreting ordered tests (e.g., lab work, imaging, nerve conduction tests) Performing post-procedure evaluations, including interpretation of diagnostic  procedures Obtaining and/or reviewing separately obtained history Performing a medically appropriate examination and/or evaluation Counseling and educating the patient/family/caregiver Ordering medications, tests, or procedures Referring and communicating with other health care professionals (when not separately reported) Documenting clinical information in the electronic or other health record Independently interpreting results (not separately reported) and communicating results to the patient/ family/caregiver Care coordination (not separately reported)  Note by: Eric DELENA Como, MD (TTS and AI technology used. I apologize for any typographical errors that were not detected and corrected.) Date: 03/08/2024; Time: 6:25 PM

## 2024-03-08 ENCOUNTER — Ambulatory Visit: Attending: Pain Medicine | Admitting: Pain Medicine

## 2024-03-08 ENCOUNTER — Encounter: Payer: Self-pay | Admitting: Pain Medicine

## 2024-03-08 VITALS — BP 128/89 | HR 77 | Temp 99.0°F | Resp 16 | Ht 69.0 in | Wt 315.0 lb

## 2024-03-08 DIAGNOSIS — Z6841 Body Mass Index (BMI) 40.0 and over, adult: Secondary | ICD-10-CM

## 2024-03-08 DIAGNOSIS — M62838 Other muscle spasm: Secondary | ICD-10-CM | POA: Diagnosis not present

## 2024-03-08 DIAGNOSIS — R937 Abnormal findings on diagnostic imaging of other parts of musculoskeletal system: Secondary | ICD-10-CM | POA: Diagnosis not present

## 2024-03-08 DIAGNOSIS — M5459 Other low back pain: Secondary | ICD-10-CM

## 2024-03-08 DIAGNOSIS — S32030S Wedge compression fracture of third lumbar vertebra, sequela: Secondary | ICD-10-CM

## 2024-03-08 DIAGNOSIS — M545 Low back pain, unspecified: Secondary | ICD-10-CM

## 2024-03-08 DIAGNOSIS — M47816 Spondylosis without myelopathy or radiculopathy, lumbar region: Secondary | ICD-10-CM | POA: Diagnosis not present

## 2024-03-08 DIAGNOSIS — S32020S Wedge compression fracture of second lumbar vertebra, sequela: Secondary | ICD-10-CM | POA: Diagnosis not present

## 2024-03-08 DIAGNOSIS — Z09 Encounter for follow-up examination after completed treatment for conditions other than malignant neoplasm: Secondary | ICD-10-CM

## 2024-03-08 NOTE — Progress Notes (Signed)
 Safety precautions to be maintained throughout the outpatient stay will include: orient to surroundings, keep bed in low position, maintain call bell within reach at all times, provide assistance with transfer out of bed and ambulation.

## 2024-03-08 NOTE — Patient Instructions (Signed)
 ______________________________________________________________________    Muscle Spasms & Cramps  Cause(s):  Most common - vitamin and/or electrolyte (calcium , potassium, sodium, etc.) deficiencies. Post procedure - steroids (injected, oral, or inhaled) can make your kidneys excrete (loose) electrolytes. Most of the time this will not cause any symptoms however, if you happen to be borderline low on your electrolytes, it may temporarily triggering cramps & spasms.  Possible triggers: Sweating - causes loss of electrolytes thru the skin. Steroids - causes loss of electrolytes thru the urine.  Treatment: (over-the-counter)  Gatorade (or any other electrolyte-replenishing drink) - Take 1, 8 oz glass with each meal (3 times a day). Mechanism of action: Replenishes lost electrolytes. Magnesium  400 to 500 mg - Take 1 tablet twice a day (one with breakfast and one at bedtime). If you have kidney disease talk to your primary care physician before taking any Magnesium . Mechanism of action: Magnesium  is a natural muscle relaxant. Tonic Water with quinine - Take 1, 8 oz glass before bedtime.  Mechanism of action: Quinine is used to treat spasms.  Last Update: 10/10/2022  ______________________________________________________________________     ______________________________________________________________________    Steroid injections  Common steroids for injections Triamcinolone : Used by many sports medicine physicians for large joint and bursal injections, often combined with a local anesthetic like lidocaine . A study focusing on coccydynia (tailbone pain) found triamcinolone  was more effective than betamethasone, suggesting it may also be preferable for other localized inflammation conditions. Methylprednisolone : A common alternative to triamcinolone  that is also a strong anti-inflammatory. It is available in different formulations, with the acetate suspension being the long-acting option for  intra-articular injections. Dexamethasone : This is a non-particulate steroid, meaning it has a lower risk of tissue damage compared to particulate steroids like triamcinolone  and methylprednisolone . While less common for this specific use, it is an option for targeted injections.   Considerations for physicians Particulate vs. non-particulate steroids: Triamcinolone  and methylprednisolone  are particulate, meaning they can clump together. Dexamethasone  is non-particulate. Particulate steroids are often preferred for their longer-lasting effects but carry a theoretical higher risk for certain injections (though this is less of a concern in the costochondral joints). Combined injectate: Corticosteroids are typically mixed with a local anesthetic like lidocaine  to provide both immediate pain relief (from the anesthetic) and longer-term inflammation reduction (from the steroid). Imaging guidance: To ensure accurate placement of the needle and medication, physicians may use ultrasound or fluoroscopic guidance for the injection, especially in complex or refractory cases.   Patient guidance Before undergoing a steroid injection, discuss the options with your physician. They will determine the best steroid, dosage, and procedure for your specific case based on factors like: Severity of your condition History of response to other treatments Your overall health status Experience and preference of the physician  Last  Updated: 11/25/2023 ______________________________________________________________________     ______________________________________________________________________    Procedure instructions  Stop blood-thinners  Do not eat or drink fluids (other than water) for 6 hours before your procedure  No water for 2 hours before your procedure  Take your blood pressure medicine with a sip of water  Arrive 30 minutes before your appointment  If sedation is planned, bring suitable driver.  [place], [place], & public transportation are NOT APPROVED)  Carefully read the Preparing for your procedure detailed instructions  If you have questions call us  at [phone]  Procedure appointments are for procedures only.   NO medication refills or new problem evaluations will be done on procedure days.   Only the scheduled, pre-approved procedure  and side will be done.   ______________________________________________________________________     ______________________________________________________________________    Preparing for your procedure  Appointments: If you think you may not be able to keep your appointment, call 24-48 hours in advance to cancel. We need time to make it available to others.  Procedure visits are for procedures only. During your procedure appointment there will be: NO Prescription Refills*. NO medication changes or discussions*. NO discussion of disability issues*. NO unrelated pain problem evaluations*. NO evaluations to order other pain procedures*. *These will be addressed at a separate and distinct evaluation encounter on the provider's evaluation schedule and not during procedure days.  Instructions: Food intake: Avoid eating anything solid for at least 8 hours prior to your procedure. Clear liquid intake: You may take clear liquids such as water up to 2 hours prior to your procedure. (No carbonated drinks. No soda.) Transportation: Unless otherwise stated by your physician, bring a driver. (Driver cannot be a Market Researcher, Pharmacist, Community, or any other form of public transportation.) Morning Medicines: Except for blood thinners, take all of your other morning medications with a sip of water. Make sure to take your heart and blood pressure medicines. If your blood pressure's lower number is above 100, the case will be rescheduled. Blood thinners: Make sure to stop your blood thinners as instructed.  If you take a blood thinner, but were not instructed to stop it,  call our office [phone] and ask to talk to a nurse. Not stopping a blood thinner prior to certain procedures could lead to serious complications. Diabetics on insulin : Notify the staff so that you can be scheduled 1st case in the morning. If your diabetes requires high dose insulin , take only  of your normal insulin  dose the morning of the procedure and notify the staff that you have done so. Preventing infections: Shower with an antibacterial soap the morning of your procedure.  Build-up your immune system: Take 1000 mg of Vitamin C with every meal (3 times a day) the day prior to your procedure. Antibiotics: Inform the nursing staff if you are taking any antibiotics or if you have any conditions that may require antibiotics prior to procedures. (Example: recent joint implants)   Pregnancy: If you are pregnant make sure to notify the nursing staff. Not doing so may result in injury to the fetus, including death.  Sickness: If you have a cold, fever, or any active infections, call and cancel or reschedule your procedure. Receiving steroids while having an infection may result in complications. Arrival: You must be in the facility at least 30 minutes prior to your scheduled procedure. Tardiness: Your scheduled time is also the cutoff time. If you do not arrive at least 15 minutes prior to your procedure, you will be rescheduled.  Children: Do not bring any children with you. Make arrangements to keep them home. Dress appropriately: There is always a possibility that your clothing may get soiled. Avoid long dresses. Valuables: Do not bring any jewelry or valuables.  Reasons to call and reschedule or cancel your procedure: (Following these recommendations will minimize the risk of a serious complication.) Surgeries: Avoid having procedures within 2 weeks of any surgery. (Avoid for 2 weeks before or after any surgery). Flu Shots: Avoid having procedures within 2 weeks of a flu shots or . (Avoid  for 2 weeks before or after immunizations). Barium: Avoid having a procedure within 7-10 days after having had a radiological study involving the use of radiological contrast. (Myelograms, Barium swallow  or enema study). Heart attacks: Avoid any elective procedures or surgeries for the initial 6 months after a Myocardial Infarction (Heart Attack). Blood thinners: It is imperative that you stop these medications before procedures. Let us  know if you if you take any blood thinner.  Infection: Avoid procedures during or within two weeks of an infection (including chest colds or gastrointestinal problems). Symptoms associated with infections include: Localized redness, fever, chills, night sweats or profuse sweating, burning sensation when voiding, cough, congestion, stuffiness, runny nose, sore throat, diarrhea, nausea, vomiting, cold or Flu symptoms, recent or current infections. It is specially important if the infection is over the area that we intend to treat. Heart and lung problems: Symptoms that may suggest an active cardiopulmonary problem include: cough, chest pain, breathing difficulties or shortness of breath, dizziness, ankle swelling, uncontrolled high or unusually low blood pressure, and/or palpitations. If you are experiencing any of these symptoms, cancel your procedure and contact your primary care physician for an evaluation.  Remember:  Regular Business hours are:  Monday to Thursday 8:00 AM to 4:00 PM  Provider's Schedule: Eric Como, MD:  Procedure days: Tuesday and Thursday 7:30 AM to 4:00 PM  Wallie Sherry, MD:  Procedure days: Monday and Wednesday 7:30 AM to 4:00 PM Last  Updated: 03/11/2023 ______________________________________________________________________     ______________________________________________________________________    General Risks and Possible Complications  Patient Responsibilities: It is important that you read this as it is part of your  informed consent. It is our duty to inform you of the risks and possible complications associated with treatments offered to you. It is your responsibility as a patient to read this and to ask questions about anything that is not clear or that you believe was not covered in this document.  Patient's Rights: You have the right to refuse treatment. You also have the right to change your mind, even after initially having agreed to have the treatment done. However, under this last option, if you wait until the last second to change your mind, you may be charged for the materials used up to that point.  Introduction: Medicine is not an visual merchandiser. Everything in Medicine, including the lack of treatment(s), carries the potential for danger, harm, or loss (which is by definition: Risk). In Medicine, a complication is a secondary problem, condition, or disease that can aggravate an already existing one. All treatments carry the risk of possible complications. The fact that a side effects or complications occurs, does not imply that the treatment was conducted incorrectly. It must be clearly understood that these can happen even when everything is done following the highest safety standards.  No treatment: You can choose not to proceed with the proposed treatment alternative. The "PRO(s)" would include: avoiding the risk of complications associated with the therapy. The "CON(s)" would include: not getting any of the treatment benefits. These benefits fall under one of three categories: diagnostic; therapeutic; and/or palliative. Diagnostic benefits include: getting information which can ultimately lead to improvement of the disease or symptom(s). Therapeutic benefits are those associated with the successful treatment of the disease. Finally, palliative benefits are those related to the decrease of the primary symptoms, without necessarily curing the condition (example: decreasing the pain from a flare-up of a  chronic condition, such as incurable terminal cancer).  General Risks and Complications: These are associated to most interventional treatments. They can occur alone, or in combination. They fall under one of the following six (6) categories: no benefit or worsening of symptoms;  bleeding; infection; nerve damage; allergic reactions; and/or death. No benefits or worsening of symptoms: In Medicine there are no guarantees, only probabilities. No healthcare provider can ever guarantee that a medical treatment will work, they can only state the probability that it may. Furthermore, there is always the possibility that the condition may worsen, either directly, or indirectly, as a consequence of the treatment. Bleeding: This is more common if the patient is taking a blood thinner, either prescription or over the counter (example: Goody Powders, Fish oil, Aspirin, Garlic, etc.), or if suffering a condition associated with impaired coagulation (example: Hemophilia, cirrhosis of the liver, low platelet counts, etc.). However, even if you do not have one on these, it can still happen. If you have any of these conditions, or take one of these drugs, make sure to notify your treating physician. Infection: This is more common in patients with a compromised immune system, either due to disease (example: diabetes, cancer, human immunodeficiency virus [HIV], etc.), or due to medications or treatments (example: therapies used to treat cancer and rheumatological diseases). However, even if you do not have one on these, it can still happen. If you have any of these conditions, or take one of these drugs, make sure to notify your treating physician. Nerve Damage: This is more common when the treatment is an invasive one, but it can also happen with the use of medications, such as those used in the treatment of cancer. The damage can occur to small secondary nerves, or to large primary ones, such as those in the spinal cord and  brain. This damage may be temporary or permanent and it may lead to impairments that can range from temporary numbness to permanent paralysis and/or brain death. Allergic Reactions: Any time a substance or material comes in contact with our body, there is the possibility of an allergic reaction. These can range from a mild skin rash (contact dermatitis) to a severe systemic reaction (anaphylactic reaction), which can result in death. Death: In general, any medical intervention can result in death, most of the time due to an unforeseen complication. ______________________________________________________________________

## 2024-03-09 ENCOUNTER — Ambulatory Visit (HOSPITAL_COMMUNITY): Payer: Self-pay | Admitting: Licensed Clinical Social Worker

## 2024-03-09 DIAGNOSIS — F4323 Adjustment disorder with mixed anxiety and depressed mood: Secondary | ICD-10-CM
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
## 2024-03-09 NOTE — Patient Instructions (Signed)
 Using Behavioral Activation to manage stress/depression symptoms     Identify/understand your own mood triggers.   Structure your day--get up around the same time, eat meals/snacks around the same time, go to bed around the same time.   Purposefully schedule self care time and time to complete tasks. This can include quiet time.  Stimulate your brain--go for a walk, text/call a friend or family member, if you are indoors--go outside (and vice versa), go for a drive, go to a store with bright colors and bright lights. Try to do things in a different way--drive to your favorite places using an alternative route, or instead of starting on the right side of the grocery store when shopping, start on the left side. You might feel a bit uncomfortable doing things outside of the comfort zone, but this is helping the brain create new neural pathways and is very healthy for brain/emotional health.   Physical movement based on your ability. If you can go for a walk, do stretches, even waving your hands to music can trigger feel-good endorphins in the brain and help release physical tension we all hold in our bodies.  Even 5 minutes can make a difference.   Be intentional about doing things that bring you joy (or used to bring you joy), and look for the things in every day that make you happy.  Seek those glimmers of joy each day.  Set a timer for 5 minutes for a harder task (ex. Laundry, washing dishes).  Allow yourself to work distraction-free for 5 minutes, then stop when the timer goes off. If you need a break, take a break. If you want to continue working then set another timer for whatever time you choose.   Limit or eliminate substance use including alcohol, marijuana, or recreational use of prescription medication.  Let in the light!! Open the window blinds, curtains and let natural light in. Even sitting near a window or sitting outside can boost your mood, especially in the wintertime when  there is less daylight.   If you take medication to manage symptoms, remember to take all medications as they are prescribed (please read all labels!!)    Things to envision for ourselves to to improve inspiration, motivation, and initiative :  improving physical wellness, focus on family relationships, focusing on our own mental/emotional well being, being a part of a bigger community, finding a hobby, being a part of something that fosters personal growth, engaging socially with others (even digitally!!!)   Sleep Hygiene Tips  Bedroom Environment - Keep your bedroom cool, quiet, and dark - Use blackout curtains or a sleep mask - Limit noise with earplugs or a white noise machine - Invest in a comfortable mattress and pillows - Remove electronic devices or dim brightness  Sleep Schedule - Go to bed and wake up at the same time every day, even on weekends - Avoid long naps during the day (limit to 2030 minutes if needed) - Establish a relaxing pre-sleep routine (e.g., reading, gentle stretching)  Diet & Substances - Avoid caffeine, nicotine, and alcohol close to bedtime - Don't go to bed hungry or overly full - Limit fluid intake in the evening to reduce nighttime bathroom trips  Technology & Stimulation - Avoid screens (phones, tablets, TVs) at least 30-60 minutes before bed - Use blue light filters if screen use is unavoidable - Don't work or watch intense/triggering content in bed  Mental & Physical Health - Practice relaxation techniques like deep breathing, meditation,  or progressive muscle relaxation - Exercise regularly, but not too close to bedtime - Manage stress and anxiety through journaling, therapy, or mindfulness  When You Cant Sleep - Get out of bed if you can't sleep after 20 minutes - Do something calming in dim light, then return to bed when sleepy - Avoid clock-watching, which can increase anxiety   Emergency Resources:  National Suicide & Crisis  Lifeline: Call or text 988  Crisis Text Line: Text HOME to (863)392-2022  [facility]  [address] [place], [place] [address] [phone] or [phone] [facility] 24/7 FOR ANYONE [address], [place], [place]  [phone] Fax: [phone] [email or web address] *Interpreters available *Accepts all insurance and uninsured for Urgent Care needs *Accepts Medicaid and uninsured for outpatient treatment (below)

## 2024-03-17 ENCOUNTER — Other Ambulatory Visit: Payer: Self-pay

## 2024-03-17 ENCOUNTER — Inpatient Hospital Stay (HOSPITAL_COMMUNITY): Admission: RE | Admit: 2024-03-17 | Discharge: 2024-03-17 | Attending: Surgery

## 2024-03-17 ENCOUNTER — Encounter (HOSPITAL_COMMUNITY): Admission: RE | Admit: 2024-03-17 | Discharge: 2024-03-17 | Attending: Surgery

## 2024-03-17 DIAGNOSIS — Z01818 Encounter for other preprocedural examination: Secondary | ICD-10-CM

## 2024-03-23 ENCOUNTER — Encounter: Attending: Surgery | Admitting: Dietician

## 2024-03-23 ENCOUNTER — Encounter: Payer: Self-pay | Admitting: Dietician

## 2024-03-23 VITALS — Ht 69.0 in | Wt 316.9 lb

## 2024-03-23 DIAGNOSIS — Z713 Dietary counseling and surveillance: Secondary | ICD-10-CM | POA: Insufficient documentation

## 2024-03-23 DIAGNOSIS — E669 Obesity, unspecified: Secondary | ICD-10-CM | POA: Diagnosis present

## 2024-03-23 DIAGNOSIS — Z6841 Body Mass Index (BMI) 40.0 and over, adult: Secondary | ICD-10-CM | POA: Diagnosis not present

## 2024-03-23 NOTE — Progress Notes (Signed)
 Nutrition Follow-up for Bariatric Surgery: Pre-Surgery Behavioral and Nutrition Intervention Program   Appt Start Time: 1405    End Time: 1430  Pt completed visits.   Pt has cleared nutrition requirements.   Planned surgery: Sleeve Gastrectomy Pt expectation of surgery: to lose 100 lbs  NUTRITION ASSESSMENT   Anthropometrics  Start weight at [facility]: 315.1 lbs (date: 03/04/2024)  Height: 69 in Weight today: 316.9 lbs. BMI:  kg/m2     Clinical  Medical hx: anemia, arthritis, back pain, breast cancer, HTN, migraines, depression, sleep apnea, obesity Medications: gabapentin , omeprazole , hydrochlorothiazide , iron , vit D3, bupropion, magnesium , topiramate , latanoprost  Labs: sodium 145, glucose 117 (pt states she is going to get labs for the pathway on the 15th of December) Notable signs/symptoms: walking with a cane  Lifestyle & Dietary Hx  Pt arrived without the cane today. Pt states she went to see an allergist, stating she got some eye drops and a nasal spray, also Zyrtec. Pt states she started some seasonal allergic reactions, stating she usually does not get it this time of year. Pt states she tried practicing not drinking with her meals, stating it was difficult at first. Pt states she is getting close to 64 oz of fluids, stating the last 16 oz bottle is tough. Pt states she did at least a snack for lunch, stating she has worked on making her portions smaller. Pt states she has been trying to move a little bit more.  Pts current physical activity: ADL's, with 0% recommendation reached  24-Hr Dietary Recall First Meal: cereal with 2% milk or breakfast buiscut with eggs and sausage or sausage, egg and whole wheat bread Snack: crackers (NABS) or bag of chips or banana or yogurt Second Meal: skip Snack: maybe another snack Third Meal: pizza (last night) or spaghetti or hamburger soup or pork chops and broccoli or turkey and greens and pinto beans or wraps or baked chicken with  yellow rice and cabbage Snack: popcorn Beverages: water, water with flavorings, two times half and half tea.   Estimated Energy Needs Calories: 1500  NUTRITION DIAGNOSIS  Overweight/obesity ([place]-3.3) related to past poor dietary habits and physical inactivity as evidenced by patient w/ planned sleeve surgery following dietary guidelines for continued weight loss.  NUTRITION INTERVENTION  Nutrition counseling (C-1) and education (E-2) to facilitate bariatric surgery goals.  Educated pt on micronutrient deficiencies post-surgery and behavioral/dietary strategies to start in order to mitigate that risk   Behavioral and Dietary Interventions  Practicing not drinking with your meals before bariatric surgery helps you build the habit youll need afterward, when your stomach will be much smaller and more sensitive. Separating fluids from meals prevents food from washing through too quickly, reduces the risk of discomfort, and helps you feel satisfied longer. Getting used to this pattern now makes the transition after surgery smoother and far easier to maintain.  Eating every 3-5 hours helps keep your energy steady and prevents the kind of intense hunger that can lead to overeating later. Keeping a regular rhythm also makes it easier to stay mindful about portions and food choices. By avoiding skipped meals, you support more stable blood sugar, better appetite control, and a smoother daily routine thats easier to maintain long term.  Switching to non-caloric, non-carbonated, and non-caffeinated beverages helps support hydration without adding extra calories or causing discomfort after bariatric surgery. Options like water, unsweetened tea, Crystal Light, and other zero-calorie drinks keep things varied while staying within those guidelines. Aiming for 64 ounces per day  helps maintain steady hydration, supports digestion, and keeps your energy more stable throughout the day.  Pre-Op Goals Reviewed with  the Patient  Goals Switch to non-caloric, non-carbonated and non-caffeinated beverages such as  water, unsweetened tea, Crystal Light and zero calorie beverages (aim for 64 oz. per day) Eat every 3-5 hours; avoid skipping meals        Practice not drinking with your meals  Handouts Provided Include    Learning Style & Readiness for Change Teaching method utilized: Visual, Auditory, and hands on  Demonstrated degree of understanding via: Teach Back  Readiness Level: preparation Barriers to learning/adherence to lifestyle change: mobility  RD's Notes for Next Visit Patient progress towards chosen goals   MONITORING & EVALUATION Dietary intake, weekly physical activity, body weight, and preoperative behavioral change goals   Next Steps  Pt has completed visits. No further supervised visits required/recommended. Patient is to follow up at [facility] for pre-op class >2 weeks prior to scheduled surgery.

## 2024-05-03 ENCOUNTER — Ambulatory Visit: Payer: Self-pay | Admitting: Surgery

## 2024-05-17 ENCOUNTER — Encounter

## 2024-06-01 ENCOUNTER — Inpatient Hospital Stay (HOSPITAL_COMMUNITY): Admit: 2024-06-01 | Admitting: Surgery
